# Patient Record
Sex: Female | Born: 1940 | Race: White | Hispanic: No | Marital: Married | Smoking: Never smoker
Health system: Southern US, Community
[De-identification: ages and names within clinical notes are randomized; demographics above are authoritative.]

## PROBLEM LIST (undated history)

## (undated) DIAGNOSIS — D126 Benign neoplasm of colon, unspecified: Secondary | ICD-10-CM

## (undated) DIAGNOSIS — R1319 Other dysphagia: Secondary | ICD-10-CM

## (undated) DIAGNOSIS — R519 Headache, unspecified: Secondary | ICD-10-CM

## (undated) DIAGNOSIS — K5790 Diverticulosis of intestine, part unspecified, without perforation or abscess without bleeding: Secondary | ICD-10-CM

## (undated) DIAGNOSIS — I4819 Other persistent atrial fibrillation: Secondary | ICD-10-CM

## (undated) DIAGNOSIS — Z87442 Personal history of urinary calculi: Secondary | ICD-10-CM

## (undated) DIAGNOSIS — F329 Major depressive disorder, single episode, unspecified: Secondary | ICD-10-CM

## (undated) DIAGNOSIS — Z9889 Other specified postprocedural states: Secondary | ICD-10-CM

## (undated) DIAGNOSIS — I82409 Acute embolism and thrombosis of unspecified deep veins of unspecified lower extremity: Secondary | ICD-10-CM

## (undated) DIAGNOSIS — F32A Depression, unspecified: Secondary | ICD-10-CM

## (undated) DIAGNOSIS — Z8679 Personal history of other diseases of the circulatory system: Secondary | ICD-10-CM

## (undated) DIAGNOSIS — R0600 Dyspnea, unspecified: Secondary | ICD-10-CM

## (undated) DIAGNOSIS — M3313 Other dermatomyositis without myopathy: Secondary | ICD-10-CM

## (undated) DIAGNOSIS — J45909 Unspecified asthma, uncomplicated: Secondary | ICD-10-CM

## (undated) DIAGNOSIS — I340 Nonrheumatic mitral (valve) insufficiency: Secondary | ICD-10-CM

## (undated) DIAGNOSIS — F419 Anxiety disorder, unspecified: Secondary | ICD-10-CM

## (undated) DIAGNOSIS — K219 Gastro-esophageal reflux disease without esophagitis: Secondary | ICD-10-CM

## (undated) DIAGNOSIS — I071 Rheumatic tricuspid insufficiency: Secondary | ICD-10-CM

## (undated) DIAGNOSIS — I499 Cardiac arrhythmia, unspecified: Secondary | ICD-10-CM

## (undated) DIAGNOSIS — J449 Chronic obstructive pulmonary disease, unspecified: Secondary | ICD-10-CM

## (undated) DIAGNOSIS — K449 Diaphragmatic hernia without obstruction or gangrene: Secondary | ICD-10-CM

## (undated) DIAGNOSIS — M3390 Dermatopolymyositis, unspecified, organ involvement unspecified: Secondary | ICD-10-CM

## (undated) DIAGNOSIS — R1310 Dysphagia, unspecified: Secondary | ICD-10-CM

## (undated) DIAGNOSIS — M1990 Unspecified osteoarthritis, unspecified site: Secondary | ICD-10-CM

## (undated) DIAGNOSIS — C189 Malignant neoplasm of colon, unspecified: Secondary | ICD-10-CM

## (undated) DIAGNOSIS — E7800 Pure hypercholesterolemia, unspecified: Secondary | ICD-10-CM

## (undated) DIAGNOSIS — I5032 Chronic diastolic (congestive) heart failure: Secondary | ICD-10-CM

## (undated) DIAGNOSIS — R911 Solitary pulmonary nodule: Secondary | ICD-10-CM

## (undated) DIAGNOSIS — R51 Headache: Secondary | ICD-10-CM

## (undated) DIAGNOSIS — R011 Cardiac murmur, unspecified: Secondary | ICD-10-CM

## (undated) DIAGNOSIS — K222 Esophageal obstruction: Secondary | ICD-10-CM

## (undated) DIAGNOSIS — R112 Nausea with vomiting, unspecified: Secondary | ICD-10-CM

## (undated) HISTORY — DX: Pure hypercholesterolemia, unspecified: E78.00

## (undated) HISTORY — PX: OTHER SURGICAL HISTORY: SHX169

## (undated) HISTORY — DX: Gastro-esophageal reflux disease without esophagitis: K21.9

## (undated) HISTORY — PX: TUBAL LIGATION: SHX77

## (undated) HISTORY — DX: Dermatopolymyositis, unspecified, organ involvement unspecified: M33.90

## (undated) HISTORY — DX: Diaphragmatic hernia without obstruction or gangrene: K44.9

## (undated) HISTORY — DX: Rheumatic tricuspid insufficiency: I07.1

## (undated) HISTORY — PX: BREAST SURGERY: SHX581

## (undated) HISTORY — DX: Diverticulosis of intestine, part unspecified, without perforation or abscess without bleeding: K57.90

## (undated) HISTORY — DX: Other dermatomyositis without myopathy: M33.13

## (undated) HISTORY — PX: LEG SKIN LESION  BIOPSY / EXCISION: SUR473

## (undated) HISTORY — PX: TOOTH EXTRACTION: SUR596

## (undated) HISTORY — DX: Esophageal obstruction: K22.2

## (undated) HISTORY — DX: Malignant neoplasm of colon, unspecified: C18.9

## (undated) HISTORY — DX: Other dysphagia: R13.19

## (undated) HISTORY — DX: Dysphagia, unspecified: R13.10

## (undated) HISTORY — DX: Other persistent atrial fibrillation: I48.19

## (undated) HISTORY — DX: Benign neoplasm of colon, unspecified: D12.6

## (undated) HISTORY — DX: Solitary pulmonary nodule: R91.1

## (undated) HISTORY — PX: APPENDECTOMY: SHX54

## (undated) HISTORY — DX: Nonrheumatic mitral (valve) insufficiency: I34.0

## (undated) HISTORY — PX: VEIN LIGATION AND STRIPPING: SHX2653

---

## 1997-04-23 DIAGNOSIS — C189 Malignant neoplasm of colon, unspecified: Secondary | ICD-10-CM

## 1997-04-23 HISTORY — DX: Malignant neoplasm of colon, unspecified: C18.9

## 1997-04-23 HISTORY — PX: LOW ANTERIOR BOWEL RESECTION: SUR1240

## 2003-04-28 ENCOUNTER — Ambulatory Visit (HOSPITAL_COMMUNITY): Admission: RE | Admit: 2003-04-28 | Discharge: 2003-04-28 | Payer: Self-pay | Admitting: Family Medicine

## 2003-06-09 ENCOUNTER — Ambulatory Visit (HOSPITAL_COMMUNITY): Admission: RE | Admit: 2003-06-09 | Discharge: 2003-06-09 | Payer: Self-pay | Admitting: Family Medicine

## 2003-07-19 ENCOUNTER — Ambulatory Visit (HOSPITAL_COMMUNITY): Admission: RE | Admit: 2003-07-19 | Discharge: 2003-07-19 | Payer: Self-pay | Admitting: Gastroenterology

## 2003-07-26 ENCOUNTER — Ambulatory Visit (HOSPITAL_COMMUNITY): Admission: RE | Admit: 2003-07-26 | Discharge: 2003-07-26 | Payer: Self-pay | Admitting: Gastroenterology

## 2003-08-12 ENCOUNTER — Ambulatory Visit (HOSPITAL_COMMUNITY): Admission: RE | Admit: 2003-08-12 | Discharge: 2003-08-12 | Payer: Self-pay | Admitting: Gastroenterology

## 2004-06-08 ENCOUNTER — Ambulatory Visit (HOSPITAL_COMMUNITY): Admission: RE | Admit: 2004-06-08 | Discharge: 2004-06-08 | Payer: Self-pay | Admitting: Family Medicine

## 2004-09-26 ENCOUNTER — Ambulatory Visit (HOSPITAL_COMMUNITY): Admission: RE | Admit: 2004-09-26 | Discharge: 2004-09-26 | Payer: Self-pay | Admitting: Family Medicine

## 2004-11-16 ENCOUNTER — Ambulatory Visit: Payer: Self-pay | Admitting: Internal Medicine

## 2004-11-20 ENCOUNTER — Ambulatory Visit: Payer: Self-pay | Admitting: Internal Medicine

## 2004-11-20 ENCOUNTER — Ambulatory Visit (HOSPITAL_COMMUNITY): Admission: RE | Admit: 2004-11-20 | Discharge: 2004-11-20 | Payer: Self-pay | Admitting: Internal Medicine

## 2004-11-20 ENCOUNTER — Encounter: Payer: Self-pay | Admitting: Internal Medicine

## 2005-07-16 ENCOUNTER — Ambulatory Visit (HOSPITAL_COMMUNITY): Admission: RE | Admit: 2005-07-16 | Discharge: 2005-07-16 | Payer: Self-pay | Admitting: Family Medicine

## 2005-12-18 ENCOUNTER — Ambulatory Visit: Payer: Self-pay | Admitting: Internal Medicine

## 2006-01-01 ENCOUNTER — Ambulatory Visit (HOSPITAL_COMMUNITY): Admission: RE | Admit: 2006-01-01 | Discharge: 2006-01-01 | Payer: Self-pay | Admitting: Internal Medicine

## 2006-01-01 ENCOUNTER — Ambulatory Visit: Payer: Self-pay | Admitting: Internal Medicine

## 2006-04-09 ENCOUNTER — Ambulatory Visit: Payer: Self-pay | Admitting: Internal Medicine

## 2006-07-18 ENCOUNTER — Ambulatory Visit (HOSPITAL_COMMUNITY): Admission: RE | Admit: 2006-07-18 | Discharge: 2006-07-18 | Payer: Self-pay | Admitting: Family Medicine

## 2006-07-23 ENCOUNTER — Ambulatory Visit: Payer: Self-pay | Admitting: Internal Medicine

## 2006-07-23 ENCOUNTER — Ambulatory Visit (HOSPITAL_COMMUNITY): Admission: RE | Admit: 2006-07-23 | Discharge: 2006-07-23 | Payer: Self-pay | Admitting: Family Medicine

## 2006-08-28 ENCOUNTER — Ambulatory Visit: Payer: Self-pay | Admitting: Internal Medicine

## 2007-07-09 ENCOUNTER — Ambulatory Visit (HOSPITAL_COMMUNITY): Admission: RE | Admit: 2007-07-09 | Discharge: 2007-07-09 | Payer: Self-pay | Admitting: Family Medicine

## 2007-07-11 ENCOUNTER — Emergency Department (HOSPITAL_COMMUNITY): Admission: RE | Admit: 2007-07-11 | Discharge: 2007-07-11 | Payer: Self-pay | Admitting: Family Medicine

## 2007-07-15 ENCOUNTER — Ambulatory Visit (HOSPITAL_COMMUNITY): Admission: RE | Admit: 2007-07-15 | Discharge: 2007-07-15 | Payer: Self-pay | Admitting: Family Medicine

## 2007-07-23 HISTORY — PX: ESOPHAGOGASTRODUODENOSCOPY: SHX1529

## 2007-07-24 ENCOUNTER — Ambulatory Visit: Payer: Self-pay | Admitting: Internal Medicine

## 2007-07-24 ENCOUNTER — Ambulatory Visit (HOSPITAL_COMMUNITY): Admission: RE | Admit: 2007-07-24 | Discharge: 2007-07-24 | Payer: Self-pay | Admitting: Family Medicine

## 2007-08-12 ENCOUNTER — Ambulatory Visit (HOSPITAL_COMMUNITY): Admission: RE | Admit: 2007-08-12 | Discharge: 2007-08-12 | Payer: Self-pay | Admitting: Internal Medicine

## 2007-08-12 ENCOUNTER — Ambulatory Visit: Payer: Self-pay | Admitting: Internal Medicine

## 2007-09-17 ENCOUNTER — Ambulatory Visit: Payer: Self-pay | Admitting: Internal Medicine

## 2008-01-20 ENCOUNTER — Ambulatory Visit: Payer: Self-pay | Admitting: Gastroenterology

## 2008-02-10 ENCOUNTER — Ambulatory Visit: Payer: Self-pay | Admitting: Internal Medicine

## 2008-02-22 HISTORY — PX: COLONOSCOPY: SHX174

## 2008-03-01 ENCOUNTER — Ambulatory Visit (HOSPITAL_COMMUNITY): Admission: RE | Admit: 2008-03-01 | Discharge: 2008-03-01 | Payer: Self-pay | Admitting: Internal Medicine

## 2008-03-01 ENCOUNTER — Ambulatory Visit: Payer: Self-pay | Admitting: Internal Medicine

## 2008-04-07 ENCOUNTER — Ambulatory Visit: Payer: Self-pay | Admitting: Internal Medicine

## 2008-07-16 ENCOUNTER — Ambulatory Visit (HOSPITAL_COMMUNITY): Admission: RE | Admit: 2008-07-16 | Discharge: 2008-07-16 | Payer: Self-pay | Admitting: Family Medicine

## 2008-07-30 ENCOUNTER — Ambulatory Visit (HOSPITAL_COMMUNITY): Admission: RE | Admit: 2008-07-30 | Discharge: 2008-07-30 | Payer: Self-pay | Admitting: Family Medicine

## 2009-08-02 ENCOUNTER — Ambulatory Visit (HOSPITAL_COMMUNITY): Admission: RE | Admit: 2009-08-02 | Discharge: 2009-08-02 | Payer: Self-pay | Admitting: Family Medicine

## 2010-04-03 ENCOUNTER — Encounter (INDEPENDENT_AMBULATORY_CARE_PROVIDER_SITE_OTHER): Payer: Self-pay | Admitting: *Deleted

## 2010-04-26 DIAGNOSIS — E7800 Pure hypercholesterolemia, unspecified: Secondary | ICD-10-CM | POA: Insufficient documentation

## 2010-04-26 DIAGNOSIS — K219 Gastro-esophageal reflux disease without esophagitis: Secondary | ICD-10-CM | POA: Insufficient documentation

## 2010-04-26 DIAGNOSIS — K644 Residual hemorrhoidal skin tags: Secondary | ICD-10-CM | POA: Insufficient documentation

## 2010-04-26 DIAGNOSIS — E119 Type 2 diabetes mellitus without complications: Secondary | ICD-10-CM | POA: Insufficient documentation

## 2010-04-26 DIAGNOSIS — K5730 Diverticulosis of large intestine without perforation or abscess without bleeding: Secondary | ICD-10-CM | POA: Insufficient documentation

## 2010-04-26 DIAGNOSIS — R1313 Dysphagia, pharyngeal phase: Secondary | ICD-10-CM | POA: Insufficient documentation

## 2010-04-26 DIAGNOSIS — D010 Carcinoma in situ of colon: Secondary | ICD-10-CM | POA: Insufficient documentation

## 2010-05-01 ENCOUNTER — Ambulatory Visit
Admission: RE | Admit: 2010-05-01 | Discharge: 2010-05-01 | Payer: Self-pay | Source: Home / Self Care | Attending: Urgent Care | Admitting: Urgent Care

## 2010-05-01 DIAGNOSIS — K5900 Constipation, unspecified: Secondary | ICD-10-CM | POA: Insufficient documentation

## 2010-05-25 NOTE — Assessment & Plan Note (Signed)
Summary: 1 YR FU/HEMORRHOIDS/GERD/SS   Primary Care Provider:  Dr Phillips Odor  Chief Complaint:  2 year follow up.  History of Present Illness: 70 y/o caucasian female here for FU GERD & hemorrhoids, doing well w/ rare flares heartburn only if she overeats.   Taking omeprazole 20mg  daily.  c/o constipation usually once a month, taking metamucil.  Seems to help.  BM daily w/ hard stools.  c/o small amt bright red blood on toilet paper & on stool and felt hemorrhoids 02/2010.       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 Once Daily As Needed 2)  Fosamax 70 Mg Tabs (Alendronate Sodium) .... Wkly 3)  Daily-Vitamin  Tabs (Multiple Vitamin) .... Once Daily 4)  Calcium 500 Mg Tabs (Calcium) .... Once Daily 5)  Albuterol Sulfate (5 Mg/ml) 0.5% Nebu (Albuterol Sulfate) .... As Needed 6)  Vytorin 10-20 Mg Tabs (Ezetimibe-Simvastatin) .... Once Daily 7)  Fiber Diet  Tabs (Fiber) .... Once Daily 8)  Omeprazole 20 Mg Cpdr (Omeprazole) .... Once Daily 9)  Proctofoam Hc 1-1 % Foam (Hydrocortisone Ace-Pramoxine) .... Apply To Rectum Two Times A Day X 10 Days  Allergies (verified): 1)  ! Hydrocodone-Acetaminophen (Hydrocodone-Acetaminophen) 2)  ! * Ivp Dye 3)  ! Nabumetone (Nabumetone)  Past History:  Past Medical History: Hx colon ca hematuria hemorrhoids last colonoscopy Dr Jena Gauss 02/2008->ext hemorrhoids, dilated 2009 GERD DM hypercholesterolemia diverticulosis esophageal dysphagia, cervical esophageal web  Past Surgical History: INGUNAL HERNIA REPAIR s/p low ant resection, no adj chemo tubal ligation  Review  of Systems      See HPI General:  Denies fever, chills, sweats, anorexia, fatigue, weakness, malaise, weight loss, and sleep disorder. CV:  Denies chest pains, angina, palpitations, syncope, dyspnea on exertion, orthopnea, PND, peripheral edema, and claudication. Resp:  Denies dyspnea at rest, dyspnea with exercise, cough, sputum, wheezing, coughing up blood, and pleurisy. GI:  Denies difficulty swallowing, pain on swallowing, jaundice, diarrhea, black BMs, and fecal incontinence. GU:  Denies urinary burning, blood in urine, nocturnal urination, urinary frequency, and urinary incontinence. MS:  Denies joint pain / LOM, joint swelling, joint stiffness, joint deformity, low back pain, muscle weakness, muscle cramps, muscle atrophy, leg pain at night, leg pain with exertion, and shoulder pain / LOM hand / wrist pain (CTS). Derm:  Denies rash, itching, dry skin, hives, moles, warts, and unhealing ulcers. Psych:  Denies depression, anxiety, memory loss, suicidal ideation, hallucinations, paranoia, phobia, and confusion. Heme:  Denies bruising, bleeding, and enlarged lymph nodes.  Vital Signs:  Patient profile:   70 year old female Height:      62 inches Weight:      153 pounds BMI:     28.09 Temp:     97.6 degrees F oral Pulse rate:   72 / minute BP sitting:   128 / 88  (left arm) Cuff size:   regular  Vitals Entered By: Hendricks Limes LPN (May 01, 2010 10:56 AM)  Physical Exam  General:  Well developed, well nourished, no acute distress. Head:  Normocephalic and atraumatic. Eyes:  Sclera clear, no icterus. Mouth:  No deformity or lesions, dentition normal.  Neck:  Supple; no masses or thyromegaly. Lungs:  Clear throughout to auscultation. Heart:  Regular rate and rhythm; no murmurs, rubs,  or bruits. Abdomen:  Soft, nontender and nondistended. No masses, hepatosplenomegaly or hernias noted. Normal bowel sounds.without guarding and without rebound.   Msk:  Symmetrical with no gross  deformities. Normal posture. Extremities:  No clubbing, cyanosis, edema or deformities noted. Neurologic:  Alert and  oriented x4;  grossly normal neurologically. Skin:  Intact without significant lesions or rashes. Cervical Nodes:  No significant cervical adenopathy. Psych:  Alert and cooperative. Normal mood and affect.  Impression & Recommendations:  Problem # 1:  EXTERNAL HEMORRHOIDS (ICD-455.3)  70 y/o caucasian female w/ bleeding hemorrhoids in setting of chronic constipation.  Hx of colon ca with last colonoscopy 2009.  If bleeding persists, would consider repeating colonoscopy at earlier than recommended interval (2014).  Orders: Est. Patient Level III ([address])  Problem # 2:  Hx of CARCINOMA IN SITU OF COLON (ICD-230.3) see #1  Problem # 3:  GERD (ICD-530.81) Stable, continue omeprazole 20g daily  Problem # 4:  CONSTIPATION (ICD-564.00) Cont metamucil Add as needed Miralax 17 grams daily  Patient Instructions: 1)  Call if bleeding persists or further problems w/ hemorrhoids Prescriptions: PROCTOFOAM HC 1-1 % FOAM (HYDROCORTISONE ACE-PRAMOXINE) Apply to rectum two times a day x 10 days  #1 x 0   Entered and Authorized by:   Joselyn Arrow FNP-BC   Signed by:   Joselyn Arrow FNP-BC on 05/01/2010   Method used:   Electronically to        [facility]  [address] [address] [id number] (retail)       [address]       [place], [place]  [address]       Ph: [phone] or [phone]       Fax: [phone]   RxID:   [id number]

## 2010-05-25 NOTE — Letter (Signed)
Summary: Recall Office Visit  [facility]  [address]   [place], [place] [address]   Phone: [phone]  Fax: [phone]      April 03, 2010   [facility] [address] [place], [place]  [address] 1941-03-04   Dear Ms. Derousse,   According to our records, it is time for you to schedule a follow-up office visit with [name].   At your convenience, please call [phone] to schedule an office visit. If you have any questions, concerns, or feel that this letter is in error, we would appreciate your call.   Sincerely,    Diana Eves  [facility] Ph: [phone]   Fax: [phone]

## 2010-07-05 ENCOUNTER — Other Ambulatory Visit (HOSPITAL_COMMUNITY): Payer: Self-pay | Admitting: Family Medicine

## 2010-07-05 DIAGNOSIS — Z139 Encounter for screening, unspecified: Secondary | ICD-10-CM

## 2010-07-27 ENCOUNTER — Encounter: Payer: Self-pay | Admitting: Internal Medicine

## 2010-08-07 ENCOUNTER — Ambulatory Visit (HOSPITAL_COMMUNITY)
Admission: RE | Admit: 2010-08-07 | Discharge: 2010-08-07 | Disposition: A | Discharge status: Home / Self Care | Payer: Medicare Other | Source: Ambulatory Visit | Attending: Family Medicine | Admitting: Family Medicine

## 2010-08-07 DIAGNOSIS — Z1231 Encounter for screening mammogram for malignant neoplasm of breast: Secondary | ICD-10-CM | POA: Insufficient documentation

## 2010-08-07 DIAGNOSIS — Z139 Encounter for screening, unspecified: Secondary | ICD-10-CM

## 2010-09-05 NOTE — Assessment & Plan Note (Signed)
NAME:  [name] [name], [name] [name]             CHART#:  [id number]   DATE:  09/17/2007                       DOB:  10-12-1940   CHIEF COMPLAINT:  Followup EGD.   PROBLEM LIST:  1. Esophageal dysphagia, status post dilatation of Schatzki's ring,      large hiatal hernia on EGD August 12, 2007.  Cervical esophageal web      dilated as well.  2. Chronic gastroesophageal reflux disease.  3. Colon cancer, status post low anterior resection back in 1999.  4. History of colonic adenoma.  5. Negative colonoscopy in 2007.  6. Diabetes mellitus.  7. Hypercholesterolemia.  8. Inguinal hernia repair.  9. Vein stripping.  10.Appendicitis, post appendectomy.  11.Internal, external hemorrhoids.  12.Diverticulosis.   Mrs. [name] is [name] 70 year old female.  She was status post esophageal  dilatation for dysphasia.  She is doing quite well.  She is taking  omeprazole 20 mg daily.  She continues to note some mid abdominal pain.  She describes loud passing of flatus over the last 2-3 weeks.  She has  noticed [name] change in her bowel habits.  She has had different size and  shape.  She denies any rectal bleeding or melena or mucous in her  stools.  Denies any constipation and diarrhea.  Denies any heartburn,  indigestion, dysphagia, odynophagia, anorexia, or early satiety.  Her  weight has remained stable.  Denies any fever or chills.  She is  complaining of bilateral hip pain.  It is  worse with movement.  She has  not mentioned this to her primary care [name] [name].  She believes she may  have more arthritis..   CURRENT MEDICATIONS:  See the list from Sep 17, 2007.   ALLERGIES:  HYDROCODONE, IVP DYE.   OBJECTIVE:  VITAL SIGNS:  Weight 156 pounds, height 52-1/2 inches,  temperature 98.1, blood pressure 120/80, pulse 64.  GENERAL:  [name] [name] is [name] well-developed, well-nourished Caucasian  female.  No acute distress.  HEENT:  Sclerae nonicteric.  Clear conjunctivae. Oropharynx pink and  moist without  lesions.  CHEST:  Regular rate and rhythm.  Normal S1 and S2.  ABDOMEN:  Positive bowel sounds x4.  No bruits auscultated.  Soft,  nontender without palpable mass or megaly.  No rebound tenderness or  guarding.   ASSESSMENT:  1. An esophageal dysphagia secondary to cervical web and Schatzki's      ring, resolved post dilatation.  2. History of colonic adenomas and colon cancer, status post low      anterior resection.  3. Abdominal bloating and increased flatus.  4. Chronic gastroesophageal reflux disease.  5. Diverticulosis.   PLAN:  1. She was supposed to be taking omeprazole 40 mg daily after      esophageal dilatation; however, she is still on once daily.  Will      increase to omeprazole 20 mg b.i.d.  2. Gas bloat literature given for her review.  3. Colonoscopy in 2010.  4. Tylenol for her bilateral hip pain and follow up with Dr. Phillips Odor      regarding this.  5. Fiber supplement of choice.       [name] [name], N.P.  Electronically Signed     [name] [name], M.D.  Electronically Signed    [name] [name]/MEDQ  D:  09/17/2007  T:  09/18/2007  Job:  [id number]  cc:   Corrie Mckusick, M.D.

## 2010-09-05 NOTE — Op Note (Signed)
NAME:  [name], [name]            ACCOUNT NO.:  [id number]   MEDICAL RECORD NO.:  [id number]          PATIENT TYPE:  AMB   LOCATION:  DAY                           FACILITY:  [facility]   PHYSICIAN:  R. Roetta Sessions, M.D. DATE OF BIRTH:  [birth year]   DATE OF PROCEDURE:  08/12/2007  DATE OF DISCHARGE:                               OPERATIVE REPORT   PROCEDURE:  Esophagogastroduodenoscopy and Maloney dilation.   INDICATIONS FOR PROCEDURE:  A 70 year old lady with a history of  worsening indigestion symptoms and esophageal dysphagia.  She has a  known large hiatal hernia on prior CT.  EGD is now being done.  Potentials for esophageal dilation were reviewed.  Risks, benefits,  alternatives and limitations have been discussed.  Questions were  answered.   PROCEDURE NOTE:  O2 saturation, blood pressure, pulse, respirations were  monitored throughout the entire procedure.   CONSCIOUS SEDATION:  Versed 5 mg IV and Demerol 100 mg IV in divided  doses.  Cetacaine spray for cricopharyngeal anesthesia.   INSTRUMENT:  Pentax video chip system.   FINDINGS:  Examination of the tubular esophagus showed a questionable  cervical esophageal web.  There was also a noncritical Schatzki ring.  Intervening esophagus appeared entirely normal.  EG junction was easily  traversed.  Stomach:  The gastric cavity was empty, insufflated well with air.  Thorough examination of gastric mucosa including retroflexion of  proximal stomach, esophagogastric junction demonstrated only a large  hiatal hernia.  Pylorus was patent and easily traversed.  Examination of  the bulb and second portion revealed no abnormalities.   THERAPEUTIC/DIAGNOSTIC MANEUVERS PERFORMED:  The scope was withdrawn.  A  56-French Maloney dilator was passed to full insertion with ease.  Subsequently, a 58-French Maloney dilator was passed to full insertion  with ease.  A look back revealed a probable cervical esophageal web had  been ruptured  without apparent complication and also the ring had been  ruptured without apparent complication.  The patient tolerated the  procedure well and was reactive in endoscopy.   IMPRESSION:  1. 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  2. Schatzki ring, noncritical appearing, status post      dilation/disruption as described above, otherwise normal-appearing      esophagus, large hiatal hernia, otherwise normal stomach, D1 and      D2.   RECOMMENDATIONS:  1. Continue omeprazole 40 mg orally daily.  2. Emphasized swallowing precaution with all her medications including      Fosamax.  3. Followup appointment with [name] to assess her progress in 4 to 6      weeks.      Jonathon Bellows, M.D.  Electronically Signed     RMR/MEDQ  D:  08/12/2007  T:  08/13/2007  Job:  [id number]   cc:   Corrie Mckusick, M.D.  Fax: [phone]

## 2010-09-05 NOTE — H&P (Signed)
NAME:  [name], [name]            ACCOUNT NO.:  [id number]   MEDICAL RECORD NO.:  [id number]          PATIENT TYPE:  AMB   LOCATION:  DAY                           FACILITY:  [facility]   PHYSICIAN:  R. Roetta Sessions, M.D. DATE OF BIRTH:  [birth year]   DATE OF ADMISSION:  DATE OF DISCHARGE:  LH                              HISTORY & PHYSICAL   CHIEF COMPLAINT:  Indigestion not lower retrosternal area, hiatal  hernia, history of colon cancer.   [name] is a 70 year old Caucasian female with a history of  colon cancer status post low anterior section back in 1999.  She has had  adenoma since that time.  Had a negative colonoscopy in 2007.  Is due  for surveillance 2010.  She did not have any lower GI tract symptoms  such as melena, rectal lesion.  She saw Dr. Phillips Odor recently who sent  her home with three Hemoccult cards.  She has not yet returned them.  Her concern, however, is some lower retroxiphoid discomfort in a knot as  well as worsening indigestion she describes as heartburn.  She had one  incident  recently.  She swallowed some food and felt it lodged behind  her breastbone.  She has been on Fosamax for some time. She has been on  omeprazole 20 mg orally daily for several months.  Previously was on  Aciphex.  Has not had any early satiety.  No nausea or vomiting.  She  does not smoke or consume alcohol.  There is no history of upper GI  tract pathology/disease.  However, she has never had her upper GI tract  imaged.   PAST MEDICAL HISTORY:  Significant for colon cancer as outlined above.  Type 2 diabetes mellitus, hypercholesterolemia.   PAST SURGICAL HISTORY:  Inguinal hernia repair, vein streaking,  appendectomy.   CURRENT MEDICATIONS:  Xanax, Fosamax, calcium, Tylenol, albuterol,  Vytorin, fiber supplement, omeprazole 20 mg daily.   ALLERGIES:  IVP DYE, HYDROCODONE.   HISTORY OF PRESENT ILLNESS:  She had a CAT scan on March 20 which  demonstrated a large  hiatal hernia, bilateral renal cyst, small duodenal  diverticulum.   [name] tells me she is being sent to Dr. Lovell Sheehan for  consideration of hiatal hernia repair.   FAMILY HISTORY:  No chronic GI or liver illness.  Mother died of brain  tumor at age 58.  Father of MI at age 53.   SOCIAL HISTORY:  The patient is married.  She has two children.  She is  retired.  No alcohol, tobacco or drug use.   REVIEW OF SYSTEMS:  Weight stable.  No chest pain, no dyspnea on  exertion.   PHYSICAL EXAMINATION:  GENERAL:  Pleasant 70 year old lady resting  comfortably.  VITAL SIGNS:  Weight 156.5, height 5 feet 2.5 inches, temp 98.2, BP  110/74, pulse 72.  SKIN:  Warm and dry.  HEENT:  There is no scleral icterus.  Conjunctivae are pink.  CHEST:  Lungs are clear to auscultation.  CARDIAC:  Regular rate and rhythm without murmur, gallop or rub.  ABDOMEN:  Nondistended.  Positive  bowel sounds.  She does have some  tenderness below the xiphoid process appreciable, of marble size, fairly  minimal not most consistent with a tender lipoma.  She has multiple  other similar larger lesions on her upper extremities as well.   IMPRESSION:  [name] is a pleasant 70 year old lady with  recent indigestion symptoms which may be related to gastroesophageal  reflux disease and her episode of esophageal food impaction.  She has a  large hiatal hernia on recent CT scan.  She may have an associated ring  or stricture.  We need to consider possibility of complicated  gastroesophageal reflux disease.  My concern is high given the fact she  has been taking Fosamax.   She has a tender abdominal wall lipoma which is contributing to her  symptoms.   She states she is going to be seeing Dr. Lovell Sheehan for hiatal hernia  repair.  I told her she ought to hold off on that until we can  investigate her upper GI tract symptoms further.  As a separate issue  she has a history of colon cancer and is due for  surveillance in 2010.  Unless her Hemoccults are positive I see no need to change the  scheduling or surveillance  slated for next year.  Will plan EGD with  possible esophageal dilation or other maneuvers as appropriate in the  very near future.  Risks, benefits, alternatives, and limitations have  been reviewed with [name] [name] and her questions answered.  She is  agreeable.   I would like to thank Dr. Phillips Odor for allowing me to see this nice lady  today.  Further recommendations to follow.      Jonathon Bellows, M.D.  Electronically Signed     RMR/MEDQ  D:  07/24/2007  T:  07/24/2007  Job:  [id number]

## 2010-09-05 NOTE — H&P (Signed)
NAME:  Trotter, Yukari            ACCOUNT NO.:  [id number]   MEDICAL RECORD NO.:  [id number]          PATIENT TYPE:  AMB   LOCATION:  DAY                           FACILITY:  [facility]   PHYSICIAN:  R. Roetta Sessions, M.D. DATE OF BIRTH:  Feb 07, 1941   DATE OF ADMISSION:  DATE OF DISCHARGE:  LH                              HISTORY & PHYSICAL   CHIEF COMPLAINT:  Hematochezia, history of colon cancer.   Ms. Gracy Racer is a very pleasant 70 year old lady with a history of  colon cancer status post low anterior resection back in 1999.  She had a  colonoscopy in 2007 for intermittent hematochezia.  She was found to  have internal and external hemorrhoids.  Anastomosis of 10-12 cm.  It is  recommended that she come back in 2011 for a followup exam.  She has had  intermittent rectal bleeding off and on over the past year, perhaps 6  episodes.  She was hemoccult negative through the office on January 20, 2008.  She did take a course of intermittent Forte suppositories.  She is not having any abdominal pain.  Reflux symptom is well-controlled  on omeprazole 20 mg orally daily.   PAST MEDICAL HISTORY:  Significant for colorectal cancer status post low  anterior resection limited stage disease requiring no adjuvant therapy,  history of dysphagia related to cervical esophageal web status post  dilation in April 2009, chronic gastroesophageal reflux disease, history  of colonic polyps as well, type 2 diabetes mellitus,  hypercholesterolemia, diverticulosis.   PAST SURGERIES:  Low anterior resection status post appendectomy,  inguinal hernia repair.   CURRENT MEDICATIONS:  1. Xanax 0.5 mg daily p.r.n.  2. Fosamax 70 mg weekly.  3. One a day calcium and vitamin D supplement.  4. Tylenol p.r.n.  5. Albuterol inhaler p.r.n.  6. Vytorin 10/20 daily.  7. Metamucil daily.  8. Omeprazole 20 mg orally daily.   ALLERGIES:  HYDROCODONE, IVP DYE, NABUMETONE.   FAMILY HISTORY:  No chronic GI or liver  illness.  Father died with MI at  age 41, mother died of brain tumor at age 87.   SOCIAL HISTORY:  The patient is married.  She has 2 children.  She is  retired.  No alcohol, tobacco, or illicit drug use.   REVIEW OF SYSTEMS:  Weight is stable.  No chest pain, dyspnea, fever, or  chills.  No odynophagia, no dysphagia.   PHYSICAL EXAMINATION:  GENERAL:  Pleasant 70 year old lady resting  comfortably.  VITAL SIGNS:  Weight is up 3 pounds from prior weight on July 24, 2007.  She weighed 155.5 pounds on January 20, 2008, height 5 feet 2 inches,  temperature 98, BP 124/70, pulse 80.  SKIN:  Warm and dry.  CHEST:  Lungs are clear to auscultation.  CARDIAC:  Regular rate and rhythm without murmur, gallop, or rub.  BREAST:  Deferred.  ABDOMEN:  Nondistended.  Positive bowel sounds, soft, nontender without  appreciable mass, or organomegaly.   IMPRESSION:  Rectal exam deferred to the time of colonoscopy.   IMPRESSION:  Ms. Madilynne Mullan is a very pleasant 70 year old with  a  history of colon cancer.  Last colonoscopy was 2 years ago.  She has  known internal and external hemorrhoids, intermittent hematochezia, and  most likely is related to hemorrhoids; however, this is a bothersome  ongoing complaint in view of her history of colon cancer.  It has been  just over 2 years since she last had her lower GI tract imaged.   RECOMMENDATIONS:  I feel she will likely ultimately need to go ahead and  see a surgeon for more definitive treatment of her hemorrhoids as they  have been a nuisance for this nice lady really for a number of years.  However, given her history of colon cancer, it has been over 2 years  since she last had her colon imaged, I told Ms. Haflinger it would be  best if she had her colon looked at again currently before sending her  to a surgeon and have her hemorrhoids dealt with surgically.  The risks,  benefits, alternatives, and limitations have been reviewed.   Questions  were answered.  She is agreeable.  We will make further recommendations  once colonoscopy has been performed.      Jonathon Bellows, M.D.  Electronically Signed     RMR/MEDQ  D:  02/10/2008  T:  02/11/2008  Job:  [id number]   cc:   Corrie Mckusick, M.D.  Fax: [phone]

## 2010-09-05 NOTE — Assessment & Plan Note (Signed)
NAME:  [name] [name] [name], [name] [name] [name]             CHART#:  [id number]   DATE:  04/07/2008                       DOB:  16-Jan-1941   CHIEF COMPLAINT:  Followup hemorrhoids.   PROBLEM LIST:  1. External hemorrhoid tags with hematochezia, last colonoscopy      March 01, 2008, by Dr. Jena Gauss.  2. History of colon cancer, status post low anterior resection,      limited stage disease requiring no adjuvant therapy.  3. History of esophageal dysphagia secondary to cervical esophageal      web, status post dilatation April 2009.  4. Chronic gastroesophageal reflux disease.  5. Type 2 diabetes mellitus.  6. Hypercholesterolemia.  7. Diverticulosis.  8. Inguinal hernia repair.   SUBJECTIVE:  The patient is [name] [name] 70 year old Caucasian female.  She has  done quite well since colonoscopy.  She did complete [name] [name] course of Anusol  Suppository.  She denies any proctalgia or rectal pruritus.  She denies  any abdominal pain, nausea, or vomiting.  Denies any constipation or  diarrhea.  Her weight is up 9 pounds in last 3 months, she is quite  concerned about this.  She denies any recent NSAID use.  She did have  some upper abdominal pain on Thanksgiving Day, but this did resolve and  was self-limited.  She denies any breakthrough heartburn or indigestion  so long as she takes her omeprazole 20 mg daily.   CURRENT MEDICATIONS:  See the list from April 07, 2008.   ALLERGIES:  Hydrocodone, IVP dye, and nabumetone.   OBJECTIVE:  VITAL SIGNS:  Weight 184 pounds, height 62-1/2 inches,  temperature 98, blood pressure 138/82, and pulse 72.  GENERAL:  She is an overweight Caucasian female, who is alert, oriented,  pleasant, cooperative, no acute distress.  HEENT:  Sclerae clear and nonicteric.  Conjunctivae pink.  Oropharynx  pink and moist without any lesions.  CHEST:  Heart, regular rate and rhythm.  Normal S1 and S2.  ABDOMEN:  Positive bowel sounds x4.  No bruits auscultated.  Soft,  nontender, nondistended without  palpable mass or hepatosplenomegaly.  No  rebound, tenderness, or guarding.  EXTREMITIES:  Without clubbing or edema.   ASSESSMENT:  1. Hemorrhoids, resolved.  If they cause further bleeding in the      future, she will need surgical evaluation for resection.  2. History of colon carcinoma and diverticulosis and chronic      gastroesophageal reflux disease, well controlled on proton pump      inhibitor.   PLAN:  1. If she has any further hemorrhoidal bleeding, she is going to call      [name] and we will move her to surgery.  2. Continue Daily fiber supplement Choice.  3. Continue omeprazole 20 mg daily.  4. If she has any further upper abdominal pain, she will call us.  5. Stressed low-fat, low-cholesterol diet and urged gradual weight      loss from just 2 pounds per week.       Lorenza Burton, N.P.  Electronically Signed     R. Roetta Sessions, M.D.  Electronically Signed    KJ/MEDQ  D:  04/07/2008  T:  04/08/2008  Job:  [id number]   cc:   Corrie Mckusick, M.D.

## 2010-09-05 NOTE — Op Note (Signed)
NAME:  [name], [name]            ACCOUNT NO.:  [id number]   MEDICAL RECORD NO.:  [id number]          PATIENT TYPE:  AMB   LOCATION:  DAY                           FACILITY:  [facility]   PHYSICIAN:  R. Roetta Sessions, M.D. DATE OF BIRTH:  Apr 19, 1941   DATE OF PROCEDURE:  03/01/2008  DATE OF DISCHARGE:                               OPERATIVE REPORT   DIAGNOSTIC COLONOSCOPY   INDICATIONS FOR PROCEDURE:  A 70 year old lady with a history of colon  cancer status post low anterior resection.  Last colonoscopy 2 years  ago.  She has known hemorrhoidal disease and has had persisting  hematochezia.  It has been an ongoing nuisance for her for some years.  Because of her history of colon cancer and her ongoing symptoms, a  colonoscopy is now being done.  Risks, benefits, and alternatives have  been reviewed.  Questions answered.  Please see documentation in medical  record.   PROCEDURE NOTE:  O2 saturation, blood pressure, pulse, respirations were  monitored throughout the entire procedure.   CONSCIOUS SEDATION:  Versed 4 mg IV and Demerol 75 mg IV in divided  doses.   INSTRUMENTATION:  Pentax video chip system.   FINDINGS:  Digital rectal exam revealed no abnormalities.  Endoscopic  findings:  Prep was good.  Colon:  Colonic mucosa was surveyed from the  rectosigmoid junction through the left transverse right colon to the  appendiceal orifice, ileocecal valve, and cecum.  These structures were  well seen and photographed for the record.  From this level, the scope  was slowly withdrawn.  All previously mentioned mucosal surfaces were  again seen.  On examination, the patient had pancolonic diverticula.  The surgical anastomosis was identified at 12 cm from the rectum.  It  appeared normal.  The scope was pulled down into the rectum where  thorough examination of rectal mucosa including retroflexion and  anteversion demonstrated no abnormalities in the anal canal and external  exam  demonstrated 2 external hemorrhoidal tags.  The patient tolerated  procedure well and was reactive in Endoscopy.   IMPRESSION:  1. External hemorrhoidal tags, otherwise normal rectal mucosa status      post surgical resection with a normal-appearing anastomosis 12 cm.  2. Pan colonic diverticulum and a residual colonic mucosa appeared      normal.   RECOMMENDATIONS:  1. Hemorrhoid literature provided to Ms. Arrey.  Daily Metamucil      or Citrucel fiber supplement.  2. A 10-day course of Anusol HC suppositories 1 per rectum at bedtime.      If rectal bleeding does not resolve, I would go ahead and send her      to a surgeon for consideration of definitive treatment of      hemorrhoidal disease.  She should have a repeat colonoscopy in 5      years.      Jonathon Bellows, M.D.  Electronically Signed     RMR/MEDQ  D:  03/01/2008  T:  03/02/2008  Job:  [id number]   cc:   Corrie Mckusick, M.D.  Fax: [phone]

## 2010-09-05 NOTE — Assessment & Plan Note (Signed)
NAME:  [name] [name], [name] [name]             CHART#:  [id number]   DATE:  01/20/2008                       DOB:  06-04-1940   PRIMARY CARE PHYSICIAN:  Corrie Mckusick, MD.   CHIEF COMPLAINT:  Proctalgia and hematochezia.   PROBLEM LIST:  1. History of colon cancer, status post low anterior resection in      1999.  2. Esophageal dysphagia, status post dilatation of Schatzki's ring,      large hiatal hernia, and EGD on August 12, 2007.  Cervical      esophageal web dilated as well.  3. History of colonic adenoma.  4. Last colonoscopy was negative in 2007 by Dr. Jena Gauss.  5. Diabetes mellitus.  6. Hypercholesterolemia.  7. Inguinal hernia repair.  8. Vein stripping.  9. Appendicitis, status post appendectomy.  10.Internal and external hemorrhoids.  11.Diverticulosis.   SUBJECTIVE:  The patient is a 70 year old female who presents today for  further evaluation of hematochezia.  She has had one episode of moderate  volume bright red blood, which she noticed on top of her stool in the  toilet.  There was no evidence of clots.  She has been having some  proctalgia after having a bowel movement.  She denies any abdominal  pain.  She describes the sensation is rectal pressure.  She denies any  nausea or vomiting, heartburn, indigestion, anorexia, or early satiety.  Her weight has remained stable.  She is having anywhere from 4 to 6 soft  semi-formed bowel movements per day.  She has not seen any further  bleeding since 2 weeks ago when she had this episode.   CURRENT MEDICATIONS:  See the list from 01/20/2008.   ALLERGIES:  Hydrocodone, IVP dye, and nabumetone.   OBJECTIVE:  VITAL SIGNS:  Weight 155.5 pounds , height 62-1/2 inches,  temperature 98, blood pressure 130/70, and pulse 72.  GENERAL:  She is a well-developed and well-nourished Caucasian female,  in no acute distress.  HEENT:  Sclerae clear and nonicteric.  Conjunctivae pink.  Oropharynx  pink and moist without any lesions.  CHEST:   Heart regular rate and rhythm.  Normal S1 and S2.  ABDOMEN:  Positive bowel sounds x4.  No bruits auscultated.  Soft,  nontender, and nondistended without palpable mass or hepatosplenomegaly.  No rebound, tenderness, or guarding.  RECTAL:  She has large external and internal hemorrhoids.  She also  appears to have a mild rectal prolapse, which is easily reducible.  She  had a small amount of light brown stool which was obtained from the  vault, was Hemoccult negative.   PLAN:  1. We will aggressively treat her hemorrhoids and would like her to      come and have a repeat rectal exam in about 3 weeks to further      assess whether she is going to need endoscopic evaluation via flex      sig versus surgical referral, if she does not respond to standard      medical therapy.  2. AnaMantle HC Forte 1 capsule per rectum b.i.d., #20 with no      refills.  3. Office visit with Dr. Jena Gauss in 3 weeks.  She is going to call      sooner if she has any problems.       Lorenza Burton, N.P.  Electronically  Signed     R. Roetta Sessions, M.D.  Electronically Signed    KJ/MEDQ  D:  01/20/2008  T:  01/20/2008  Job:  [id number]   cc:   Corrie Mckusick, M.D.

## 2010-09-08 NOTE — Op Note (Signed)
NAME:  [name], [name]            ACCOUNT NO.:  [id number]   MEDICAL RECORD NO.:  [id number]          PATIENT TYPE:  AMB   LOCATION:  DAY                           FACILITY:  [facility]   PHYSICIAN:  R. Roetta Sessions, M.D. DATE OF BIRTH:  [birth year]   DATE OF PROCEDURE:  11/20/2004  DATE OF DISCHARGE:                                 OPERATIVE REPORT   PROCEDURE:  Colonoscopy and biopsy.   INDICATIONS FOR PROCEDURE:  Patient is a 70 year old lady with intermittent  blood per rectum.  She has a history of undergoing a colonic resection for  colon cancer back in 1999.  Colonoscopy is now being done.  It was discussed  with the patient at length potential risks and benefits had been reviewed.  She is agreeable.  Please see documented medical record.   DESCRIPTION OF PROCEDURE:  Saturation, blood pressure and pulses were  monitored.  At the time of discharge conscious sedation with Versed 4 mg IV  and Demerol 100 mg IV in divided doses.   INSTRUMENTS:  Olympus video chip system.   FINDINGS:  Digital rectal exam revealed no abnormalities.  Endoscopic  findings:  Prep was good.  Rectum:  Examination of the rectal mucosa on the  retroflexed view of the anal verge __________ demonstrating internal and  external hemorrhoids.  There was a 5 mm diminutive polyp at the surgical  anastomosis which is identified at 10 cm from the anal verge.   COLON:  Colonic mucosa was surveyed from the anastomosis all the way to the  cecum.  Ileocecal valve and appendiceal orifice structures well seen and  photographed for the record.  The patient had diverticula from the  anastomosis all the way to the cecum.  From this level, the scope was slowly  withdrawn and previously mentioned mucosal surfaces were again seen.  The  residual colonic mucosa appeared normal except for pancolonic diverticula  and diminutive rectal polyp at the anastomosis. The diminutive polyp at the  anastomosis was cold biopsied/removed.   Patient tolerated the procedure well  and __________.   IMPRESSION:  Internal and external hemorrhoids.  Diminutive polyp at the  surgical anastomosis 10 cm removed with cold biopsy forceps.  Pan colonic  diverticula.  The remainder of residual colon otherwise appeared normal.  I  suspect the patient bled from hemorrhoids recently.   RECOMMENDATIONS:  1.  Follow-up on pathology.  2.  Diverticulosis and hemorrhoid ledger provided to [name].  3.  A 10-day course of Anusol HC suppositories one per rectum at bedtime.      Follow-up on pathology.  4.  Further recommendations to follow.       RMR/MEDQ  D:  11/20/2004  T:  11/20/2004  Job:  [id number]   cc:   Corrie Mckusick, M.D.  Fax: [phone]

## 2010-09-08 NOTE — H&P (Signed)
NAME:  [name] [name], [name] [name]            ACCOUNT NO.:  [id number]   MEDICAL RECORD NO.:  [id number]          PATIENT TYPE:  AMB   LOCATION:  DAY                           FACILITY:  [facility]   PHYSICIAN:  R. Roetta Sessions, M.D. DATE OF BIRTH:  30-May-1940   DATE OF ADMISSION:  DATE OF DISCHARGE:  LH                              HISTORY & PHYSICAL   INDICATION:  Follow-up of colon cancer, status post low anterior  resection, 1999, history of adenoma and anastomosis in 2006, polyp and  colonoscopy in 2007 for low-volume hematochezia, demonstrated internal  and external hemorrhoids.   HISTORY:  She has done very well.  She was seen for clear reflux on  07/23/2006.  She took one course of AcipHex and it has pretty much  abolished her reflux symptoms.  She is now off of it.  Depending on body  frame, she is a good 20 to 25 pounds over her ideal body weight.  She is  not having any odynophagia or dysphagia.  She has not had an upper GI  tract evaluation, but symptoms come without the long features and they  were pretty much squelched with acid suppression therapy.  She is not  having any odynophagia or dysphagia stated.  She does take Fosamax.   CURRENT MEDICATIONS:  See MD list.   ALLERGIES:  HYDROCODONE.   PHYSICAL EXAMINATION:  GENERAL:  On exam today, it looks well.  VITAL SIGNS:  Weight 162, which is actually down 3 pounds.  Height 5  feet 2-1/2 inches, temperature 97.9, BP 102/58, pulse 65.  CHEST:  Lungs are clear to auscultation.  HEART:  No murmur.  Regular rate and rhythm, without murmur, gallop, or  rub.  ABDOMEN:  Nondistended.  Obese.  Positive bowel sounds.  Soft and  nontender.  No appreciable mass or organomegaly.   ASSESSMENT:  History of colon cancer status post low anterior resection  with adenoma subsequently removed.  She ought to have another  colonoscopy in 2010.  Reflux symptoms are well-controlled on acid  suppression.  Relative obesity is predisposing her to reflux.   I will  allow her to stay on Fosamax for now.  Will get her back on AcipHex 20  mg orally daily.  She should take this medication on a regular basis and  her reflux measures/literature  emphasized.  Should she be able to accomplish a 10-pound to 15-pound  weight loss between now and the end of the year, she could probably take  AcipHex every-other-day therapy or even come off and use H2 blockers and  antacids p.r.n.  Unless that becomes a problem, I plan to see the  patient back in one year.      Jonathon Bellows, M.D.  Electronically Signed     RMR/MEDQ  D:  08/28/2006  T:  08/28/2006  Job:  [id number]

## 2010-09-08 NOTE — Op Note (Signed)
NAME:  [name] [name], [name] [name]            ACCOUNT NO.:  [id number]   MEDICAL RECORD NO.:  [id number]          PATIENT TYPE:  AMB   LOCATION:  DAY                           FACILITY:  [facility]   PHYSICIAN:  R. Roetta Sessions, M.D. DATE OF BIRTH:  11-15-1940   DATE OF PROCEDURE:  01/01/2006  DATE OF DISCHARGE:                                 OPERATIVE REPORT   PROCEDURE:  Diagnostic colonoscopy.   INDICATION FOR PROCEDURE:  The patient is a 70 year old lady with a history  of colon cancer, status post low anterior resection previously.  Last  colonoscopy was 1 year, and she had a small adenoma at the anastomosis  removed.  She has had some intermittent low-volume hematochezia.  She is  known to have internal and external hemorrhoids.  She was given a course of  Anusol suppositories.  This has been associated with the resolution of her  bleeding.  Colonoscopy is now being done.  This approach has been discussed  with the patient at length, the potential risks, benefits, and alternatives  have been reviewed, questions answered.  She is agreeable.  Please see the  documentation in the medical record.   PROCEDURE NOTE:  O2 saturation, blood pressure, pulse, and respiration were  monitored throughout the entire procedure.   CONSCIOUS SEDATION:  Versed 3 mg IV, Demerol 75 mg IV in divided doses.   INSTRUMENT USED:  Olympus video colonoscope.   FINDINGS:  Digital rectal exam revealed external hemorrhoids.  Endoscopic  findings:  The prep was adequate.   Rectum:  Examination of the rectal mucosa including a retroflexed view of  the anal verge demonstrated internal and external hemorrhoids.  Surgical  anastomosis 10-12 cm.  Suture was protruding in the mucosa down at the  anastomosis.  Otherwise, the anastomosis appeared normal as did the rectal  mucosa.   Colon:  The colonic mucosa was surveyed from the anastomosis through the  more proximal colon, all the way to the cecum.  The cecum, ileocecal  valve  and appendiceal orifice were well-seen and photographed for the record.  From this level the scope was slowly withdrawn and all previously-mentioned  mucosal surfaces were again seen.  The patient had extensive diverticula.  There was no evidence of recurrent cancer or adenoma.  The patient tolerated  the procedure well, was reacted in endoscopy.   IMPRESSION:  1. Internal and external hemorrhoids, likely source of bleeding, otherwise      normal residual rectum, anastomosis at 10-12 cm.  2. The patient had extensive diverticulosis, otherwise the residual      colonic mucosa appeared normal.   RECOMMENDATIONS:  1. Diverticulosis and hemorrhoid literature provided to Ms. Iannone.      Since her rectal bleeding is  2. Since her rectal bleeding is resolved, no further GI evaluation is      warranted.  Plan for her to come back for surveillance colonoscopy in 3      years.      Jonathon Bellows, M.D.  Electronically Signed     RMR/MEDQ  D:  01/01/2006  T:  01/01/2006  Job:  [id number]   cc:   Corrie Mckusick, M.D.  Fax: [phone]

## 2010-09-08 NOTE — H&P (Signed)
NAME:  [name] [name], [name] [name]            ACCOUNT NO.:  [id number]   MEDICAL RECORD NO.:  [id number]          PATIENT TYPE:  AMB   LOCATION:                                FACILITY:  [facility]   PHYSICIAN:  R. Roetta Sessions, M.D. DATE OF BIRTH:  25-Mar-1941   DATE OF ADMISSION:  DATE OF DISCHARGE:  LH                                HISTORY & PHYSICAL   CHIEF COMPLAINT:  Intermittent rectal bleeding.   HISTORY OF PRESENT ILLNESS:  Mrs. Kurt is a 70 year old Caucasian  female who has a history of colorectal carcinoma, is status post low  anterior resection in 1999.  She had a colonoscopy on November 20, 2004, by Dr.  Jena Gauss.  Was found to have internal/external hemorrhoids and a diminutive 5  mm polyp at the surgical anastomotic site which was at 10 cm which came back  adenomatous.  She also was found to have pancolonic diverticula.  She tells  me she has had pink to reddish streaks after a bowel movement for several  weeks now, both on the toilet paper as well as in her panty liners.  She has  noticed some abdominal bloating and some mild abdominal pain.  She has  noticed increased flatus.  She has had some straining with stools.  She says  she has been more on the constipated side but generally has a bowel movement  every day.  Denies any diarrhea.  Her weight has remained stable.  She  denies any anorexia, denies any proctalgia or pruritus.   PAST MEDICAL HISTORY:  1. Inguinal hernia repair.  2. Vein stripping.  3. Appendectomy.  4. Colorectal carcinoma status post low anterior resection 1999.  5. Last colonoscopy last year as described in HPI.  6. Laparotomy and tubal ligation.  7. Type 2 diabetes mellitus.  8. Hypercholesterolemia.   CURRENT MEDICATIONS:  1. Xanax 0.5 mg daily.  2. Fosamax 70 mg weekly.  3. Multivitamin once daily.  4. Calcium, vitamin D and K once daily.  5. Tylenol p.r.n.  6. Albuterol p.r.n.  7. Vytorin 10/20 mg p.r.n.   ALLERGIES:  HYDROCODONE.   FAMILY  HISTORY:  Mother deceased secondary to a brain tumor at age 92.  Father had an MI at age 45.  There is no known family history of colorectal  carcinoma, liver or chronic GI problems in first degree relatives.  Two  cousins had colon cancer as well.   SOCIAL HISTORY:  Mrs. Pohlmann is married.  She has two children.  She is  retired.  She denies any tobacco, alcohol, or drug use.   REVIEW OF SYSTEMS:  CONSTITUTIONAL:  Weight remains stable.  Denies any  fevers or chills.  Denies any fatigue.  CARDIOVASCULAR:  Denies chest pain  or palpitations.  PULMONARY:  Denies any shortness of breath, dyspnea,  cough, hemoptysis.  GI:  See HPI.  Denies any heartburn, indigestion,  dysphagia, or odynophagia.   PHYSICAL EXAMINATION:  VITAL SIGNS:  Weight 163.5 pounds, height 63 inches,  temperature 98 degrees, blood pressure 110/68, and pulse 72.  GENERAL:  Mrs. Mcphail is a 70 year old Caucasian female  who is alert,  oriented, pleasant, and cooperative in no acute distress.  HEENT:  Sclerae clear, nonicteric.  Conjunctivae pink.  Oropharynx pink and  moist without any lesions.  NECK:  Supple without any thyromegaly.  CHEST:  Heart regular rate and rhythm.  Normal S1, S2 without murmurs,  clicks, rubs, or gallops.  LUNGS:  Clear to auscultation bilaterally.  ABDOMEN:  Positive bowel sounds x4.  No bruits auscultated.  Soft,  nontender, nondistended, without palpable mass or hepatosplenomegaly.  No  rebound tenderness or guarding.  EXTREMITY:  Without clubbing or edema bilaterally.  SKIN:  Pink, warm and dry without rash or jaundice.   ASSESSMENT:  Mrs. Brawley is a 70 year old Caucasian female with previous  history of colorectal carcinoma status post low anterior resection in 1999.  She had colonoscopy last year, was found to have a 5 mm adenomatous polyp at  the site of the anastomosis from the previous cancer.  I have discussed this  case with Dr. Karilyn Cota and since she is having intermittent  rectal bleeding  for several weeks now she is going to need further evaluation with  colonoscopy to determine the source of her bleeding.   PLAN:  1. Anusol HC suppositories one per rectum b.i.d. x10 days, no refills.  2. Will schedule colonoscopy with Dr. Jena Gauss in the near future.   I have discussed the procedure including the risks and benefits which  include but are not limited to bleeding, infection, perforation, drug  reaction.  She agrees to the plan and consent will be obtained.      Nicholas Lose, N.P.      Jonathon Bellows, M.D.  Electronically Signed    KC/MEDQ  D:  12/18/2005  T:  12/18/2005  Job:  [id number]   cc:   Corrie Mckusick, M.D.  Fax: [phone]

## 2010-09-08 NOTE — Consult Note (Signed)
NAME:  [name], [name]            ACCOUNT NO.:  [id number]   MEDICAL RECORD NO.:  [id number]          PATIENT TYPE:  AMB   LOCATION:  DAY                           FACILITY:  [facility]   PHYSICIAN:  R. Roetta Sessions, M.D. DATE OF BIRTH:  [birth year]   DATE OF CONSULTATION:  11/16/2004  DATE OF DISCHARGE:                                   CONSULTATION   REASON FOR CONSULTATION:  History of colon cancer and hematochezia, need for  colonoscopy.   HISTORY OF PRESENT ILLNESS:  [name] is a 70 year old lady seen  by the courtesy of [facility] for consideration of  colonoscopy.  [name] tells me she was found to have a colon cancer in  1999 by Dr. Heloise Purpura at [facility].  She underwent a  laparotomy with resection.  Apparently she had limited stage disease.  She  never saw a hematologist/oncologist.  She has had at least two subsequent  colonoscopies which were reportedly unremarkable.  Last seen some 3-4 years  ago by Dr. Cleotis Nipper.  She has had some intermittent blood per rectum here  recently in the setting of some constipation.  She is interested in having  another colonoscopy at this time.  Apparently, she was referred down to  [facility] and saw Dr. Victorino Dike last year for a clinically dilated bile  duct.  I have no records regarding this evaluation.   [name] has not had any abdominal pain.  She has had some recent  reflux symptoms for which she started on Prilosec by Dr. Phillips Odor with  excellent control of symptoms.  She does take Fosamax and has not had any  odynophagia or dysphagia.  Certainly no melena or weight loss.  No early  satiety.   PAST MEDICAL HISTORY:  1.  Significant for history of colon cancer.  2.  History of type 2 diabetes mellitus, apparently diet controlled.   PAST SURGICAL HISTORY:  1.  Inguinal hernia repair.  2.  Vein stripping.  3.  Appendectomy.  4.  Colon cancer.  5.  Laparotomy/tubal  ligation.   CURRENT MEDICATIONS:  1.  Xanax 0.5 mg once daily.  2.  Fosamax 70 mg weekly.  3.  One-A-Day vitamin daily.  4.  Calcium.  5.  Vitamin D and K daily.  6.  Tylenol p.r.n.  7.  Albuterol inhaler p.r.n.   ALLERGIES:  Hydrocodone causes a rash.   FAMILY HISTORY:  Mother succumb to brain cancer at age 46.  Father had a  heart attack at age 56.  There was no history of chronic GI or liver  illnesses aside from two cousins with colon cancer.  No first degree  relatives with colon cancer.   SOCIAL HISTORY:  The patient is married and has two children.  She is  retired from the [employer]  No tobacco or alcohol.  No illicit  drugs.   PHYSICAL EXAMINATION:  GENERAL APPEARANCE:  Pleasant 70 year old lady  resting comfortably.  VITAL SIGNS:  Weight 158, height 5 feet 3 inches, temperature 98.3, blood  pressure 122/68, pulse 64.  SKIN:  Warm  and dry.  No jaundice.  No continuous __________ chronic liver  disease  HEENT:  No scleral icterus.  Conjunctivae pink.  Oral cavity:  No lesions.  JVD is not prominent.  CHEST:  Lungs are clear to auscultation.  CARDIAC:  Regular rate and rhythm without murmurs, gallops, rubs.  ABDOMEN:  Nondistended, positive bowel sounds, soft, nontender without  appreciable mass or organomegaly.  EXTREMITIES:  No edema.  RECTAL:  Deferred until colonoscopy.   IMPRESSION:  [name] is a 70 year old lady with a history of  colon cancer and underwent laparotomy back in 1999 at [facility].  She is now having intermittent hematochezia.  She needs to have a  colonoscopy.  Her report of having a workup for dilated bile duct is very  interesting and would like to get records from both her colon cancer surgery  in 1999 and her biliary workup done one year ago for review.  I have  discussed potential risks, benefits, alternatives with [name],  questions were answered, she is agreeable.  We will set up a colonoscopy in  the  very near future at [facility] and make further recommendations  in the very near future.   I would like to thank Dr. Dorthey Sawyer for allowing me to assist this nice  lady.       RMR/MEDQ  D:  11/16/2004  T:  11/16/2004  Job:  [id number]   cc:   Corrie Mckusick, M.D.  Fax: [phone]

## 2011-02-28 ENCOUNTER — Telehealth: Payer: Self-pay | Admitting: Internal Medicine

## 2011-02-28 NOTE — Telephone Encounter (Signed)
Asking for something for hemorrhoids she is in pain/she has tried preparation H and baieol and its not helping/please advise??

## 2011-02-28 NOTE — Telephone Encounter (Signed)
Can call in anamantle forte cream disp 1 unit - apply to anorectum BID ; no refills

## 2011-02-28 NOTE — Telephone Encounter (Signed)
Routed to KJ 

## 2011-02-28 NOTE — Telephone Encounter (Signed)
Pt aware, rx called to [facility]

## 2011-03-05 ENCOUNTER — Other Ambulatory Visit (HOSPITAL_COMMUNITY): Payer: Self-pay | Admitting: Internal Medicine

## 2011-03-05 DIAGNOSIS — M810 Age-related osteoporosis without current pathological fracture: Secondary | ICD-10-CM

## 2011-03-09 ENCOUNTER — Ambulatory Visit (HOSPITAL_COMMUNITY)
Admission: RE | Admit: 2011-03-09 | Discharge: 2011-03-09 | Disposition: A | Discharge status: Home / Self Care | Payer: Medicare Other | Source: Ambulatory Visit | Attending: Internal Medicine | Admitting: Internal Medicine

## 2011-03-09 DIAGNOSIS — M810 Age-related osteoporosis without current pathological fracture: Secondary | ICD-10-CM

## 2011-03-09 DIAGNOSIS — Z780 Asymptomatic menopausal state: Secondary | ICD-10-CM | POA: Insufficient documentation

## 2011-03-09 DIAGNOSIS — M818 Other osteoporosis without current pathological fracture: Secondary | ICD-10-CM | POA: Insufficient documentation

## 2011-08-09 ENCOUNTER — Other Ambulatory Visit (HOSPITAL_COMMUNITY): Payer: Self-pay | Admitting: Internal Medicine

## 2011-08-09 DIAGNOSIS — Z139 Encounter for screening, unspecified: Secondary | ICD-10-CM

## 2011-08-13 ENCOUNTER — Ambulatory Visit (HOSPITAL_COMMUNITY): Payer: Medicare Other

## 2011-08-23 ENCOUNTER — Ambulatory Visit (HOSPITAL_COMMUNITY)
Admission: RE | Admit: 2011-08-23 | Discharge: 2011-08-23 | Disposition: A | Discharge status: Home / Self Care | Payer: Medicare Other | Source: Ambulatory Visit | Attending: Internal Medicine | Admitting: Internal Medicine

## 2011-08-23 ENCOUNTER — Other Ambulatory Visit (HOSPITAL_COMMUNITY): Payer: Self-pay | Admitting: Internal Medicine

## 2011-08-23 DIAGNOSIS — Z1231 Encounter for screening mammogram for malignant neoplasm of breast: Secondary | ICD-10-CM | POA: Insufficient documentation

## 2011-08-23 DIAGNOSIS — J322 Chronic ethmoidal sinusitis: Secondary | ICD-10-CM | POA: Insufficient documentation

## 2011-08-23 DIAGNOSIS — R51 Headache: Secondary | ICD-10-CM

## 2011-08-23 DIAGNOSIS — Z139 Encounter for screening, unspecified: Secondary | ICD-10-CM

## 2011-10-22 ENCOUNTER — Other Ambulatory Visit (HOSPITAL_COMMUNITY): Payer: Self-pay | Admitting: Family Medicine

## 2011-10-22 DIAGNOSIS — M79609 Pain in unspecified limb: Secondary | ICD-10-CM

## 2011-10-23 ENCOUNTER — Other Ambulatory Visit (HOSPITAL_COMMUNITY): Payer: Medicare Other

## 2011-10-23 ENCOUNTER — Ambulatory Visit (HOSPITAL_COMMUNITY)
Admission: RE | Admit: 2011-10-23 | Discharge: 2011-10-23 | Disposition: A | Discharge status: Home / Self Care | Payer: Medicare Other | Source: Ambulatory Visit | Attending: Family Medicine | Admitting: Family Medicine

## 2011-10-23 DIAGNOSIS — Z86718 Personal history of other venous thrombosis and embolism: Secondary | ICD-10-CM | POA: Insufficient documentation

## 2011-10-23 DIAGNOSIS — M79609 Pain in unspecified limb: Secondary | ICD-10-CM

## 2011-10-23 DIAGNOSIS — I803 Phlebitis and thrombophlebitis of lower extremities, unspecified: Secondary | ICD-10-CM | POA: Insufficient documentation

## 2012-04-01 ENCOUNTER — Encounter: Payer: Self-pay | Admitting: *Deleted

## 2012-05-23 ENCOUNTER — Encounter: Payer: Self-pay | Admitting: Internal Medicine

## 2012-05-23 ENCOUNTER — Encounter (HOSPITAL_COMMUNITY): Payer: Self-pay | Admitting: Pharmacy Technician

## 2012-05-23 ENCOUNTER — Ambulatory Visit (INDEPENDENT_AMBULATORY_CARE_PROVIDER_SITE_OTHER): Payer: Medicare Other | Admitting: Internal Medicine

## 2012-05-23 VITALS — BP 127/75 | HR 74 | Temp 98.2°F | Ht 62.0 in | Wt 156.0 lb

## 2012-05-23 DIAGNOSIS — K219 Gastro-esophageal reflux disease without esophagitis: Secondary | ICD-10-CM

## 2012-05-23 DIAGNOSIS — Z85038 Personal history of other malignant neoplasm of large intestine: Secondary | ICD-10-CM

## 2012-05-23 MED ORDER — PEG 3350-KCL-NA BICARB-NACL 420 G PO SOLR: 4000.0000 mL | ORAL | Status: DC

## 2012-05-23 NOTE — Progress Notes (Signed)
Primary Care Physician:  FUSCO,LAWRENCE J., MD Primary Gastroenterologist:  Dr. Symir Mah  Pre-Procedure History & Physical: HPI:  [name] [name] is a 71 y.o. female here for followup colon cancer and GERD. Status post low anterior resection back in 1999. In limited stage disease requiring no adjuvant therapy. Last colonoscopy 2009 - pancolonic diverticulosis. Says she passed a little blood last fall but didn't tell anyone. No subsequent bleeding.  Occasionally constipated. Long-standing GERD. No significant dysphagia having had her esophagus dilated previously-cervical esophageal web, Schatzki's ring. Takes omeprazole 20 mg daily. Has missed a dose along the way cannot tell any difference in her symptoms. Has osteoporosis-on Fosamax. She reports her bone density study 2 years ago did not show any improvement in bone density. Past Medical History  Diagnosis Date  . Schatzki's ring   . Hematuria   . Hemorrhoids   . GERD (gastroesophageal reflux disease)   . DM (dermatomyositis)   . Hypercholesterolemia   . Diverticulosis   . Esophageal dysphagia   . Colon cancer     status post low anterior resection, limited stage disease requiring no adjuvant therapy  . Hiatal hernia   . Colon adenoma     Past Surgical History  Procedure Date  . Colonoscopy 11/09    Dr. Elliott Quade- external hemorrhoidal tags o/w normal rectal mucosa, s/p surgical resection with a normal appearing anastomosis 12cm, pan colonic diverticulum  . Ingunal hernia repair   . Tubal ligation   . Low anterior bowel resection     NO ADJ CHEMO  . Appendectomy   . Vein ligation and stripping   . Esophagogastroduodenoscopy 07/2007    Dr. Wesam Gearhart-probable cervical esophageal web, schatzki ring, large hiatal hernia    Prior to Admission medications   Medication Sig Start Date End Date Taking? Authorizing Provider  albuterol (PROVENTIL, VENTOLIN) (5 MG/ML) 0.5% NEBU Take 5 mg/hr by nebulization as needed.     Yes Historical Provider, MD   alendronate (FOSAMAX) 70 MG tablet Take 70 mg by mouth every 7 (seven) days. Take with a full glass of water on an empty stomach.    Yes Historical Provider, MD  ALPRAZolam (XANAX) 0.5 MG tablet Take 0.5 mg by mouth at bedtime as needed.     Yes Historical Provider, MD  calcium carbonate (TUMS - DOSED IN MG ELEMENTAL CALCIUM) 500 MG chewable tablet Chew 1 tablet by mouth daily.     Yes Historical Provider, MD  ezetimibe-simvastatin (VYTORIN) 10-20 MG per tablet Take 1 tablet by mouth at bedtime.     Yes Historical Provider, MD  FIBER DIET PO Take by mouth.     Yes Historical Provider, MD  hydrocortisone-pramoxine (PROCTOFOAM HC) rectal foam Place 1 applicator rectally 2 (two) times daily.     Yes Historical Provider, MD  Multiple Vitamin (MULTIVITAMIN) tablet Take 1 tablet by mouth daily.     Yes Historical Provider, MD  omeprazole (PRILOSEC) 20 MG capsule Take 20 mg by mouth daily.     Yes Historical Provider, MD    Allergies as of 05/23/2012 - Review Complete 05/23/2012  Allergen Reaction Noted  . Hydrocodone-acetaminophen  04/26/2010  . Iohexol  07/16/2008  . Nabumetone  04/26/2010    No family history on file.  History   Social History  . Marital Status: Married    Spouse Name: N/A    Number of Children: N/A  . Years of Education: N/A   Occupational History  . Not on file.   Social History Main Topics  .   Smoking status: Never Smoker   . Smokeless tobacco: Not on file  . Alcohol Use: No  . Drug Use: No  . Sexually Active: Not on file   Other Topics Concern  . Not on file   Social History Narrative  . No narrative on file    Review of Systems: See HPI, otherwise negative ROS  Physical Exam: BP 127/75  Pulse 74  Temp 98.2 F (36.8 C) (Oral)  Ht 5' 2" (1.575 m)  Wt 156 lb (70.761 kg)  BMI 28.53 kg/m2 General:   Alert,  Well-developed, well-nourished, pleasant and cooperative in NAD Skin:  Intact without significant lesions or rashes. Eyes:  Sclera clear, no  icterus.   Conjunctiva pink. Ears:  Normal auditory acuity. Nose:  No deformity, discharge,  or lesions. Mouth:  No deformity or lesions. Neck:  Supple; no masses or thyromegaly. No significant cervical adenopathy. Lungs:  Clear throughout to auscultation.   No wheezes, crackles, or rhonchi. No acute distress. Heart:  Regular rate and rhythm; no murmurs, clicks, rubs,  or gallops. Abdomen: Non-distended, normal bowel sounds.  Well-healed midline laparotomy scar. soft and nontender without appreciable mass or hepatosplenomegaly.  Pulses:  Normal pulses noted. Extremities:  Without clubbing or edema.  Impression/Plan:  71-year-old lady with a history of limited stage: Record cancer status post low anterior resection. Doing well. He passed some blood per rectum last fall but this was apparently self limiting. She is due for surveillance colonoscopy at this time.  GERD symptoms seem not be probably stays on omeprazole 20 mg orally daily. We talked about the benefits versus the risks and him at some increased risk of osteoporosis or loss of bone mineralization with chronic acid suppression therapy.    Recommendations:    Surveillance colonoscopy in the near future.The risks, benefits, limitations, alternatives and imponderables have been reviewed with the patient. Questions have been answered. All parties are agreeable. I have asked her to drop back on omeprazole to 20 mg every other day and see if this controls her reflux symptoms. If she can't tell any difference on this regimen, would have her cut back further to a when necessary use.  Swallowing precautions again reviewed.  Further recommendations to follow pending findings of colonoscopy.. 

## 2012-05-23 NOTE — Patient Instructions (Addendum)
Schedule a surveillance colonoscopy now - history for colon cancer  Continue Prilosec but try taking it every other day because of your history of osteoporosis

## 2012-06-02 ENCOUNTER — Ambulatory Visit (HOSPITAL_COMMUNITY)
Admission: RE | Admit: 2012-06-02 | Discharge: 2012-06-02 | Disposition: A | Discharge status: Home / Self Care | Payer: Medicare Other | Source: Ambulatory Visit | Attending: Internal Medicine | Admitting: Internal Medicine

## 2012-06-02 ENCOUNTER — Encounter (HOSPITAL_COMMUNITY): Payer: Self-pay | Admitting: *Deleted

## 2012-06-02 ENCOUNTER — Encounter (HOSPITAL_COMMUNITY)
Admission: RE | Disposition: A | Discharge status: Home / Self Care | Payer: Self-pay | Source: Ambulatory Visit | Attending: Internal Medicine

## 2012-06-02 DIAGNOSIS — Z85048 Personal history of other malignant neoplasm of rectum, rectosigmoid junction, and anus: Secondary | ICD-10-CM

## 2012-06-02 DIAGNOSIS — Z9049 Acquired absence of other specified parts of digestive tract: Secondary | ICD-10-CM | POA: Insufficient documentation

## 2012-06-02 DIAGNOSIS — D126 Benign neoplasm of colon, unspecified: Secondary | ICD-10-CM | POA: Insufficient documentation

## 2012-06-02 DIAGNOSIS — K5730 Diverticulosis of large intestine without perforation or abscess without bleeding: Secondary | ICD-10-CM

## 2012-06-02 DIAGNOSIS — E119 Type 2 diabetes mellitus without complications: Secondary | ICD-10-CM | POA: Insufficient documentation

## 2012-06-02 DIAGNOSIS — Z85038 Personal history of other malignant neoplasm of large intestine: Secondary | ICD-10-CM | POA: Insufficient documentation

## 2012-06-02 DIAGNOSIS — Z1211 Encounter for screening for malignant neoplasm of colon: Secondary | ICD-10-CM

## 2012-06-02 HISTORY — PX: COLONOSCOPY: SHX5424

## 2012-06-02 SURGERY — COLONOSCOPY
Anesthesia: Moderate Sedation

## 2012-06-02 MED ORDER — MEPERIDINE HCL 100 MG/ML IJ SOLN
INTRAMUSCULAR | Status: DC | PRN
  Administered 2012-06-02: 50 mg via INTRAVENOUS
  Administered 2012-06-02: 25 mg via INTRAVENOUS

## 2012-06-02 MED ORDER — ONDANSETRON HCL 4 MG/2ML IJ SOLN
INTRAMUSCULAR | Status: AC
  Filled 2012-06-02: qty 2

## 2012-06-02 MED ORDER — MIDAZOLAM HCL 5 MG/5ML IJ SOLN
INTRAMUSCULAR | Status: AC
  Filled 2012-06-02: qty 10

## 2012-06-02 MED ORDER — SODIUM CHLORIDE 0.45 % IV SOLN
INTRAVENOUS | Status: DC
  Administered 2012-06-02: 10:00:00 via INTRAVENOUS

## 2012-06-02 MED ORDER — MIDAZOLAM HCL 5 MG/5ML IJ SOLN
INTRAMUSCULAR | Status: DC | PRN
  Administered 2012-06-02 (×2): 1 mg via INTRAVENOUS
  Administered 2012-06-02: 2 mg via INTRAVENOUS

## 2012-06-02 MED ORDER — MEPERIDINE HCL 100 MG/ML IJ SOLN
INTRAMUSCULAR | Status: AC
  Filled 2012-06-02: qty 1

## 2012-06-02 MED ORDER — ONDANSETRON HCL 4 MG/2ML IJ SOLN
INTRAMUSCULAR | Status: DC | PRN
  Administered 2012-06-02: 4 mg via INTRAVENOUS

## 2012-06-02 MED ORDER — STERILE WATER FOR IRRIGATION IR SOLN
Status: DC | PRN
  Administered 2012-06-02: 10:00:00

## 2012-06-02 NOTE — Op Note (Signed)
[facility] [address] [place], [address]   COLONOSCOPY PROCEDURE REPORT  PATIENT: [name], [name]  MR#:         [id number] BIRTHDATE: 15-Dec-1940 , 71  yrs. old GENDER: Female ENDOSCOPIST: R.  Roetta Sessions, MD FACP FACG REFERRED BY:  Artis Delay, M.D. PROCEDURE DATE:  06/02/2012 PROCEDURE:     Ileocolonoscopy with snare polypectomy  INDICATIONS: history of colorectal cancer; status post low anterior resection  INFORMED CONSENT:  The risks, benefits, alternatives and imponderables including but not limited to bleeding, perforation as well as the possibility of a missed lesion have been reviewed.  The potential for biopsy, lesion removal, etc. have also been discussed.  Questions have been answered.  All parties agreeable. Please see the history and physical in the medical record for more information.  MEDICATIONS: Versed 4 mg IV and Demerol 75 mg IV in divided doses. Zofran 4 mg IV the  DESCRIPTION OF PROCEDURE:  After a digital rectal exam was performed, the Pentax Colonoscope [id number]  colonoscope was advanced from the anus through the rectum and colon to the area of the cecum, ileocecal valve and appendiceal orifice.  The cecum was deeply intubated.  These structures were well-seen and photographed for the record.  From the level of the cecum and ileocecal valve, the scope was slowly and cautiously withdrawn.  The mucosal surfaces were carefully surveyed utilizing scope tip deflection to facilitate fold flattening as needed.  The scope was pulled down into the rectum where a thorough examination including retroflexion was performed.    FINDINGS:  Adequate preparation. Surgical anastomosis identified at 10-12 cm from the anal verge. Residual rectal mucosa appeared normal. Pancolonic diverticula; (1) 4 mm polyp at the hepatic flexure; otherwise, the remainder of the residual colon mucosa appeared normal. The distal 10 cm of terminal ileual mucosa  also appeared normal.  THERAPEUTIC / DIAGNOSTIC MANEUVERS PERFORMED:  The above-mentioned polyp was cold snare removed and recovered for the pathologist.  COMPLICATIONS: None  CECAL WITHDRAWAL TIME:  10 minutes  IMPRESSION:  Status post low anterior resection. Pancolonic diverticulosis. Colonic polyp-removed as described above  RECOMMENDATIONS: Followup on pathology.   _______________________________ eSigned:  R. Roetta Sessions, MD FACP [facility] 06/02/2012 10:58 AM   CC:    PATIENT NAME:  [name], [name] MR#: [id number]

## 2012-06-02 NOTE — Interval H&P Note (Signed)
History and Physical Interval Note:  06/02/2012 10:09 AM  [name] [name]  has presented today for surgery, with the diagnosis of History of colon rectal cancer  The various methods of treatment have been discussed with the patient and family. After consideration of risks, benefits and other options for treatment, the patient has consented to  Procedure(s) with comments: COLONOSCOPY (N/A) - 10:30 as a surgical intervention .  The patient's history has been reviewed, patient examined, no change in status, stable for surgery.  I have reviewed the patient's chart and labs.  Questions were answered to the patient's satisfaction.     [name] [name]  Colonoscopy today per plan. The risks, benefits, limitations, alternatives and imponderables have been reviewed with the patient. Questions have been answered. All parties are agreeable.

## 2012-06-02 NOTE — H&P (View-Only) (Signed)
Primary Care Physician:  Cassell Smiles., MD Primary Gastroenterologist:  Dr. Jena Gauss  Pre-Procedure History & Physical: HPI:  [name] [name] is a 72 y.o. female here for followup colon cancer and GERD. Status post low anterior resection back in 1999. In limited stage disease requiring no adjuvant therapy. Last colonoscopy 2009 - pancolonic diverticulosis. Says she passed a little blood last fall but didn't tell anyone. No subsequent bleeding.  Occasionally constipated. Long-standing GERD. No significant dysphagia having had her esophagus dilated previously-cervical esophageal web, Schatzki's ring. Takes omeprazole 20 mg daily. Has missed a dose along the way cannot tell any difference in her symptoms. Has osteoporosis-on Fosamax. She reports her bone density study 2 years ago did not show any improvement in bone density. Past Medical History  Diagnosis Date  . Schatzki's ring   . Hematuria   . Hemorrhoids   . GERD (gastroesophageal reflux disease)   . DM (dermatomyositis)   . Hypercholesterolemia   . Diverticulosis   . Esophageal dysphagia   . Colon cancer     status post low anterior resection, limited stage disease requiring no adjuvant therapy  . Hiatal hernia   . Colon adenoma     Past Surgical History  Procedure Date  . Colonoscopy 11/09    Dr. Jena Gauss- external hemorrhoidal tags o/w normal rectal mucosa, s/p surgical resection with a normal appearing anastomosis 12cm, pan colonic diverticulum  . Ingunal hernia repair   . Tubal ligation   . Low anterior bowel resection     NO ADJ CHEMO  . Appendectomy   . Vein ligation and stripping   . Esophagogastroduodenoscopy 07/2007    Dr. Carron Curie cervical esophageal web, schatzki ring, large hiatal hernia    Prior to Admission medications   Medication Sig Start Date End Date Taking? Authorizing Provider  albuterol (PROVENTIL, VENTOLIN) (5 MG/ML) 0.5% NEBU Take 5 mg/hr by nebulization as needed.     Yes Historical Provider, MD   alendronate (FOSAMAX) 70 MG tablet Take 70 mg by mouth every 7 (seven) days. Take with a full glass of water on an empty stomach.    Yes Historical Provider, MD  ALPRAZolam Prudy Feeler) 0.5 MG tablet Take 0.5 mg by mouth at bedtime as needed.     Yes Historical Provider, MD  calcium carbonate (TUMS - DOSED IN MG ELEMENTAL CALCIUM) 500 MG chewable tablet Chew 1 tablet by mouth daily.     Yes Historical Provider, MD  ezetimibe-simvastatin (VYTORIN) 10-20 MG per tablet Take 1 tablet by mouth at bedtime.     Yes Historical Provider, MD  FIBER DIET PO Take by mouth.     Yes Historical Provider, MD  hydrocortisone-pramoxine (PROCTOFOAM HC) rectal foam Place 1 applicator rectally 2 (two) times daily.     Yes Historical Provider, MD  Multiple Vitamin (MULTIVITAMIN) tablet Take 1 tablet by mouth daily.     Yes Historical Provider, MD  omeprazole (PRILOSEC) 20 MG capsule Take 20 mg by mouth daily.     Yes Historical Provider, MD    Allergies as of 05/23/2012 - Review Complete 05/23/2012  Allergen Reaction Noted  . Hydrocodone-acetaminophen  04/26/2010  . Iohexol  07/16/2008  . Nabumetone  04/26/2010    No family history on file.  History   Social History  . Marital Status: Married    Spouse Name: N/A    Number of Children: N/A  . Years of Education: N/A   Occupational History  . Not on file.   Social History Main Topics  .  Smoking status: Never Smoker   . Smokeless tobacco: Not on file  . Alcohol Use: No  . Drug Use: No  . Sexually Active: Not on file   Other Topics Concern  . Not on file   Social History Narrative  . No narrative on file    Review of Systems: See HPI, otherwise negative ROS  Physical Exam: BP 127/75  Pulse 74  Temp 98.2 F (36.8 C) (Oral)  Ht 5\' 2"  (1.575 m)  Wt 156 lb (70.761 kg)  BMI 28.53 kg/m2 General:   Alert,  Well-developed, well-nourished, pleasant and cooperative in NAD Skin:  Intact without significant lesions or rashes. Eyes:  Sclera clear, no  icterus.   Conjunctiva pink. Ears:  Normal auditory acuity. Nose:  No deformity, discharge,  or lesions. Mouth:  No deformity or lesions. Neck:  Supple; no masses or thyromegaly. No significant cervical adenopathy. Lungs:  Clear throughout to auscultation.   No wheezes, crackles, or rhonchi. No acute distress. Heart:  Regular rate and rhythm; no murmurs, clicks, rubs,  or gallops. Abdomen: Non-distended, normal bowel sounds.  Well-healed midline laparotomy scar. soft and nontender without appreciable mass or hepatosplenomegaly.  Pulses:  Normal pulses noted. Extremities:  Without clubbing or edema.  Impression/Plan:  72 year old lady with a history of limited stage: Record cancer status post low anterior resection. Doing well. He passed some blood per rectum last fall but this was apparently self limiting. She is due for surveillance colonoscopy at this time.  GERD symptoms seem not be probably stays on omeprazole 20 mg orally daily. We talked about the benefits versus the risks and him at some increased risk of osteoporosis or loss of bone mineralization with chronic acid suppression therapy.    Recommendations:    Surveillance colonoscopy in the near future.The risks, benefits, limitations, alternatives and imponderables have been reviewed with the patient. Questions have been answered. All parties are agreeable. I have asked her to drop back on omeprazole to 20 mg every other day and see if this controls her reflux symptoms. If she can't tell any difference on this regimen, would have her cut back further to a when necessary use.  Swallowing precautions again reviewed.  Further recommendations to follow pending findings of colonoscopy.Marland Kitchen

## 2012-06-04 ENCOUNTER — Encounter: Payer: Self-pay | Admitting: *Deleted

## 2012-06-04 ENCOUNTER — Encounter: Payer: Self-pay | Admitting: Internal Medicine

## 2012-06-05 ENCOUNTER — Encounter (HOSPITAL_COMMUNITY): Payer: Self-pay | Admitting: Internal Medicine

## 2012-06-07 ENCOUNTER — Telehealth (INDEPENDENT_AMBULATORY_CARE_PROVIDER_SITE_OTHER): Payer: Self-pay | Admitting: Internal Medicine

## 2012-06-07 ENCOUNTER — Other Ambulatory Visit (INDEPENDENT_AMBULATORY_CARE_PROVIDER_SITE_OTHER): Payer: Self-pay | Admitting: Internal Medicine

## 2012-06-07 MED ORDER — NYSTATIN-TRIAMCINOLONE 100000-0.1 UNIT/GM-% EX CREA: TOPICAL_CREAM | Freq: Two times a day (BID) | CUTANEOUS | Status: DC

## 2012-06-07 NOTE — Telephone Encounter (Signed)
Patient called stating she was constipated. She had a colonoscopy few days ago by Dr. Jena Gauss. She also noted to some discomfort due to swollen hemorrhoids. Patient advised to take MiraLax half to one scoop daily. Prescription for Mycolog-II cream sent to her pharmacy.

## 2012-06-09 ENCOUNTER — Telehealth: Payer: Self-pay | Admitting: *Deleted

## 2012-06-09 NOTE — Telephone Encounter (Signed)
Spoke with pt- she called the hospital on Saturday and spoke with NUR, she is using the cream that NUR sent to the pharmacy and the miralax. She is not having any pain, she is just noticing some "pink" on the tissue when she wipes. She is not noticing any other problems. Warned against constipation, she said with the weather she wasn't paying attention to the fact that she hadnt had a bowel movement.Advised pt that she may need to use the cream a few more days to make sure that the hemorrhoid is healing. She agreed. I told her that I would let RMR know what is going on and if he has any further recommendations I would let her know.

## 2012-06-09 NOTE — Telephone Encounter (Signed)
[name] [name] called this am. She called over the weekend and spoke with the on call Dr about her constipation and is now spotting a small amount of blood. She would like a call back. Thank you.

## 2012-07-23 ENCOUNTER — Telehealth: Payer: Self-pay | Admitting: Internal Medicine

## 2012-07-23 NOTE — Telephone Encounter (Signed)
I agree. Would have a low threshold for taking MiraLax every day just to prevent constipation. Would continue daily as long as not having out and out diarrhea

## 2012-07-23 NOTE — Telephone Encounter (Signed)
Pt had tcs in 05/2012 by RMR and noticed this morning that she had blood in her stool. Stated it started this morning. Please advise. [phone]

## 2012-07-23 NOTE — Telephone Encounter (Signed)
Spoke with pt- she was constipated yesterday and took some miralax and had bm this morning, she was straining and saw some BR blood in her stool. No pain , no N/V, no fever. Pt stated she feels good now. She is not taking miralax daily, only prn. She did have some of the mycolog cream left over that NUR had sent in for her and she is using that on her hemorrhoids.  Advised pt that she should try to take miralax daily (stop if she develops diarrhea)    and extra fiber to avoid constipation. Also advised her to try to avoid straining. Pt verbalized understanding and said if RMR has any further recommendations we can call her back later.

## 2012-08-28 ENCOUNTER — Other Ambulatory Visit (HOSPITAL_COMMUNITY): Payer: Self-pay | Admitting: Internal Medicine

## 2012-08-28 DIAGNOSIS — Z139 Encounter for screening, unspecified: Secondary | ICD-10-CM

## 2012-09-16 ENCOUNTER — Ambulatory Visit (HOSPITAL_COMMUNITY): Payer: Medicare Other

## 2012-09-25 ENCOUNTER — Ambulatory Visit (HOSPITAL_COMMUNITY)
Admission: RE | Admit: 2012-09-25 | Discharge: 2012-09-25 | Disposition: A | Discharge status: Home / Self Care | Payer: Medicare Other | Source: Ambulatory Visit | Attending: Internal Medicine | Admitting: Internal Medicine

## 2012-09-25 DIAGNOSIS — Z1231 Encounter for screening mammogram for malignant neoplasm of breast: Secondary | ICD-10-CM | POA: Insufficient documentation

## 2012-09-25 DIAGNOSIS — Z139 Encounter for screening, unspecified: Secondary | ICD-10-CM

## 2012-12-15 ENCOUNTER — Encounter: Payer: Self-pay | Admitting: Internal Medicine

## 2012-12-16 ENCOUNTER — Encounter: Payer: Self-pay | Admitting: Gastroenterology

## 2012-12-16 ENCOUNTER — Ambulatory Visit (INDEPENDENT_AMBULATORY_CARE_PROVIDER_SITE_OTHER): Payer: Medicare Other | Admitting: Gastroenterology

## 2012-12-16 VITALS — BP 131/78 | HR 68 | Temp 97.2°F | Ht 62.0 in | Wt 155.4 lb

## 2012-12-16 DIAGNOSIS — R1011 Right upper quadrant pain: Secondary | ICD-10-CM

## 2012-12-16 DIAGNOSIS — R399 Unspecified symptoms and signs involving the genitourinary system: Secondary | ICD-10-CM

## 2012-12-16 DIAGNOSIS — R3989 Other symptoms and signs involving the genitourinary system: Secondary | ICD-10-CM

## 2012-12-16 DIAGNOSIS — K5900 Constipation, unspecified: Secondary | ICD-10-CM

## 2012-12-16 LAB — HEPATIC FUNCTION PANEL
Albumin: 4.4 g/dL (ref 3.5–5.2)
Alkaline Phosphatase: 90 U/L (ref 39–117)
Bilirubin, Direct: 0.1 mg/dL (ref 0.0–0.3)
Total Bilirubin: 0.4 mg/dL (ref 0.3–1.2)

## 2012-12-16 MED ORDER — LINACLOTIDE 145 MCG PO CAPS: 145.0000 ug | ORAL_CAPSULE | Freq: Every day | ORAL | Status: DC

## 2012-12-16 NOTE — Progress Notes (Signed)
Referring Provider: Elfredia Nevins, MD Primary Care Physician:  Cassell Smiles., MD Primary GI: Dr. Jena Gauss   Chief Complaint  Patient presents with  . Abdominal Pain    HPI:   [name] [name] is a pleasant 72 year old female with a history of GERD and colon cancer, s/p LAR in 1999. Her surveillance colonoscopy is up-to-date and due again in 2019.  She reports onset of intermittent RUQ pain last month, coming every few days. Described as sharp. Unsure if related to eating/drinking. No associated nausea or vomiting. Good appetite. Notes occasional constipation, forgets to take fiber. Then she has to "go all day". Could go up to 2-3 days without a BM. Forgets to take Metamucil. Omeprazole prn.   After urinating,will have a pain lower abdomen. Vague abdominal discomfort after urinating.   Past Medical History  Diagnosis Date  . Schatzki's ring   . Hematuria   . Hemorrhoids   . GERD (gastroesophageal reflux disease)   . DM (dermatomyositis)   . Hypercholesterolemia   . Diverticulosis   . Esophageal dysphagia   . Colon cancer     status post low anterior resection, limited stage disease requiring no adjuvant therapy  . Hiatal hernia   . Colon adenoma     Past Surgical History  Procedure Laterality Date  . Colonoscopy  11/09    Dr. Jena Gauss- external hemorrhoidal tags o/w normal rectal mucosa, s/p surgical resection with a normal appearing anastomosis 12cm, pan colonic diverticulum  . Ingunal hernia repair    . Tubal ligation    . Low anterior bowel resection      NO ADJ CHEMO  . Appendectomy    . Vein ligation and stripping    . Esophagogastroduodenoscopy  07/2007    Dr. Carron Curie cervical esophageal web, schatzki ring, large hiatal hernia  . Colonoscopy N/A 06/02/2012    [id number] post low anterior resection. Pancolonic diverticulosis. Colonic polyp-tubular adenoma. Surveillance due 2019.     Current Outpatient Prescriptions  Medication Sig Dispense Refill  .  acetaminophen (TYLENOL) 325 MG tablet Take 650 mg by mouth every 6 (six) hours as needed for pain.      Marland Kitchen albuterol (PROVENTIL HFA;VENTOLIN HFA) 108 (90 BASE) MCG/ACT inhaler Inhale 2 puffs into the lungs every 6 (six) hours as needed for wheezing.      Marland Kitchen albuterol (PROVENTIL, VENTOLIN) (5 MG/ML) 0.5% NEBU Take 5 mg/hr by nebulization as needed.        Marland Kitchen alendronate (FOSAMAX) 35 MG tablet Take 35 mg by mouth every 7 (seven) days.       . ALPRAZolam (XANAX) 0.5 MG tablet Take 0.5 mg by mouth at bedtime as needed. Anxiety/sleep.      . calcium carbonate (TUMS - DOSED IN MG ELEMENTAL CALCIUM) 500 MG chewable tablet Chew 1 tablet by mouth daily.        . cetirizine (ZYRTEC) 5 MG tablet Take 5 mg by mouth daily.      Marland Kitchen ezetimibe-simvastatin (VYTORIN) 10-20 MG per tablet Take 1 tablet by mouth at bedtime.        . Multiple Vitamin (MULTIVITAMIN) tablet Take 1 tablet by mouth daily.        Marland Kitchen omeprazole (PRILOSEC) 20 MG capsule Take 20 mg by mouth daily.        . Linaclotide (LINZESS) 145 MCG CAPS capsule Take 1 capsule (145 mcg total) by mouth daily. Take 30 minutes before breakfast daily.  30 capsule  3   No current facility-administered medications for this visit.  Allergies as of 12/16/2012 - Review Complete 12/16/2012  Allergen Reaction Noted  . Hydrocodone-acetaminophen  04/26/2010  . Iohexol  07/16/2008  . Nabumetone  04/26/2010    No family history on file.  History   Social History  . Marital Status: Married    Spouse Name: N/A    Number of Children: N/A  . Years of Education: N/A   Social History Main Topics  . Smoking status: Never Smoker   . Smokeless tobacco: None  . Alcohol Use: No  . Drug Use: No  . Sexual Activity: None   Other Topics Concern  . None   Social History Narrative  . None    Review of Systems: Negative unless mentioned in HPI.   Physical Exam: BP 131/78  Pulse 68  Temp(Src) 97.2 F (36.2 C) (Oral)  Ht 5\' 2"  (1.575 m)  Wt 155 lb 6.4 oz  (70.489 kg)  BMI 28.42 kg/m2 General:   Alert and oriented. No distress noted. Pleasant and cooperative.  Head:  Normocephalic and atraumatic. Eyes:  Conjuctiva clear without scleral icterus. Mouth:  Oral mucosa pink and moist. Good dentition. No lesions. Heart:  S1, S2 present without murmurs, rubs, or gallops. Regular rate and rhythm. Abdomen:  +BS, soft, non-tender and non-distended. No rebound or guarding. No HSM or masses noted. Msk:  Symmetrical without gross deformities. Normal posture. Extremities:  Without edema. Neurologic:  Alert and  oriented x4;  grossly normal neurologically. Skin:  Intact without significant lesions or rashes. Cervical Nodes:  No significant cervical adenopathy. Psych:  Alert and cooperative. Normal mood and affect.

## 2012-12-16 NOTE — Assessment & Plan Note (Signed)
72 year old female presents with vague reports of RUQ discomfort without any relieving or associated factors; last EGD in 2009. Gallbladder remains in situ. No up-to-date imaging available. I would like to obtain a set of LFTs and proceed with an [name] of abdomen. She does have underlying constipation that may or may not be playing a role in this scenario. Will start her on Linzess 145 mcg daily as well. See constipation. Return in 4-6 weeks, with further recommendations to follow once imaging and bloodwork completed.

## 2012-12-16 NOTE — Assessment & Plan Note (Signed)
Check UA with culture. 

## 2012-12-16 NOTE — Assessment & Plan Note (Signed)
Start Linzess 145 mcg daily. Colonoscopy up-to-date. Due to hx of colon cancer in remote past, needs surveillance again in 2019.

## 2012-12-16 NOTE — Patient Instructions (Addendum)
Please complete the blood work and urine sample. We have also scheduled you for an ultrasound of your belly.   Start taking Linzess 1 capsule 30 minutes before breakfast daily. This is for constipation. If your insurance does not cover this, we can try a different option. Please let us know if it does not.   We will see you in 4 -6 weeks!

## 2012-12-17 ENCOUNTER — Telehealth: Payer: Self-pay | Admitting: Internal Medicine

## 2012-12-17 LAB — URINALYSIS W MICROSCOPIC + REFLEX CULTURE
Bacteria, UA: NONE SEEN
Casts: NONE SEEN
Crystals: NONE SEEN
Glucose, UA: NEGATIVE mg/dL
Ketones, ur: NEGATIVE mg/dL
Leukocytes, UA: NEGATIVE
Nitrite: NEGATIVE
Specific Gravity, Urine: 1.013 (ref 1.005–1.030)
pH: 7.5 (ref 5.0–8.0)

## 2012-12-17 NOTE — Telephone Encounter (Signed)
Routing to AS. Rx is Linzess.

## 2012-12-17 NOTE — Telephone Encounter (Signed)
Pt called this afternoon to speak with AS. She was seen on 8/26 and her prescription will cost her $300. Please advise and call patient [phone]

## 2012-12-17 NOTE — Progress Notes (Signed)
CC'd to PCP 

## 2012-12-18 MED ORDER — LUBIPROSTONE 8 MCG PO CAPS: 8.0000 ug | ORAL_CAPSULE | Freq: Two times a day (BID) | ORAL | Status: DC

## 2012-12-18 NOTE — Telephone Encounter (Signed)
Trial of Amitiza BID sent to pharmacy.

## 2012-12-18 NOTE — Addendum Note (Signed)
Addended by: Nira Retort on: 12/18/2012 11:59 AM   Modules accepted: Orders

## 2012-12-19 ENCOUNTER — Ambulatory Visit (HOSPITAL_COMMUNITY)
Admission: RE | Admit: 2012-12-19 | Discharge: 2012-12-19 | Disposition: A | Discharge status: Home / Self Care | Payer: Medicare Other | Source: Ambulatory Visit | Attending: Gastroenterology | Admitting: Gastroenterology

## 2012-12-19 DIAGNOSIS — R1011 Right upper quadrant pain: Secondary | ICD-10-CM | POA: Insufficient documentation

## 2012-12-23 NOTE — Telephone Encounter (Signed)
Tried to call to inform pt- NA

## 2012-12-23 NOTE — Telephone Encounter (Signed)
Pt is aware and she is aware to take medication with food.

## 2012-12-26 NOTE — Progress Notes (Signed)
Quick Note:  Normal [name]. HFP normal. If persistent RUQ pain despite treatment of constipation, needs HIDA before next visit.  Urinalysis was inconclusive. I recommend referral to Urology if she continues to have discomfort after urinating. ______

## 2013-01-06 NOTE — Progress Notes (Signed)
Quick Note:  Tried to call pt- NA ______ 

## 2013-01-07 ENCOUNTER — Other Ambulatory Visit: Payer: Self-pay | Admitting: Gastroenterology

## 2013-01-07 DIAGNOSIS — R1011 Right upper quadrant pain: Secondary | ICD-10-CM

## 2013-01-07 NOTE — Progress Notes (Signed)
Quick Note:  Yes, proceed with HIDA scan. ______

## 2013-01-12 ENCOUNTER — Encounter (HOSPITAL_COMMUNITY): Payer: Self-pay

## 2013-01-12 ENCOUNTER — Encounter (HOSPITAL_COMMUNITY)
Admission: RE | Admit: 2013-01-12 | Discharge: 2013-01-12 | Disposition: A | Discharge status: Home / Self Care | Payer: Medicare Other | Source: Ambulatory Visit | Attending: Gastroenterology | Admitting: Gastroenterology

## 2013-01-12 DIAGNOSIS — R1011 Right upper quadrant pain: Secondary | ICD-10-CM | POA: Insufficient documentation

## 2013-01-12 HISTORY — DX: Unspecified asthma, uncomplicated: J45.909

## 2013-01-12 MED ORDER — TECHNETIUM TC 99M MEBROFENIN IV KIT: 5.0000 | PACK | Freq: Once | INTRAVENOUS | Status: AC | PRN

## 2013-01-15 NOTE — Progress Notes (Signed)
Quick Note:  HIDA scan normal. Will see how she is doing at next follow-up. ______

## 2013-01-20 ENCOUNTER — Ambulatory Visit (INDEPENDENT_AMBULATORY_CARE_PROVIDER_SITE_OTHER): Payer: Medicare Other | Admitting: Gastroenterology

## 2013-01-20 ENCOUNTER — Encounter: Payer: Self-pay | Admitting: Gastroenterology

## 2013-01-20 VITALS — BP 128/72 | HR 78 | Temp 97.6°F | Ht 62.0 in | Wt 155.2 lb

## 2013-01-20 DIAGNOSIS — R1011 Right upper quadrant pain: Secondary | ICD-10-CM

## 2013-01-20 MED ORDER — LUBIPROSTONE 24 MCG PO CAPS: 24.0000 ug | ORAL_CAPSULE | Freq: Two times a day (BID) | ORAL | Status: DC

## 2013-01-20 NOTE — Patient Instructions (Addendum)
I have provided samples of a higher dosage of Amitiza. This is . Take 1 capsule with food each evening for 3 days, then increase to twice a day if needed. I have sent in a prescription for you if this works well.   Review the reflux diet. Call us in 2 weeks to let us know how you are doing. Try to keep a record of when the discomfort occurs (related to eating, movement, associated with anything) so we can know the best way to investigate this further.

## 2013-01-20 NOTE — Progress Notes (Signed)
Referring Provider: Elfredia Nevins, MD Primary Care Physician:  Cassell Smiles., MD Primary GI: Dr. Jena Gauss  Chief Complaint  Patient presents with  . Follow-up    HPI:   [name] [name] is a pleasant 72 year old female with a history of GERD and colon cancer, s/p LAR in 1999. Her surveillance colonoscopy is up-to-date and due again in 2019. Last seen end of Aug 2014 by myself due to intermittent RUQ pain. Occasional constipation reported, skipping some days. Started on Linzess 145 mcg for constipation, but insurance denied this as first option. Sent in Amitiza BID. [name] of abdomen normal. HFP normal. Subsequent HIDA scan with normal EF of 97%, no symptoms with Ensure ingestion.  Had BM every day with Amitiza but still doesn't feel adequate. Some small balls. Has had 3 spells of RUQ discomfort but not a bad spell yet. Just hits her. Ate onions then noticed a flare, then ate again and was fine. No associated nausea, vomiting. Occasional indigestion but doesn't take Prilosec daily.   Past Medical History  Diagnosis Date  . Schatzki's ring   . Hematuria   . Hemorrhoids   . GERD (gastroesophageal reflux disease)   . DM (dermatomyositis)   . Hypercholesterolemia   . Diverticulosis   . Esophageal dysphagia   . Colon cancer     status post low anterior resection, limited stage disease requiring no adjuvant therapy  . Hiatal hernia   . Colon adenoma   . Asthma     Past Surgical History  Procedure Laterality Date  . Colonoscopy  11/09    Dr. Jena Gauss- external hemorrhoidal tags o/w normal rectal mucosa, s/p surgical resection with a normal appearing anastomosis 12cm, pan colonic diverticulum  . Ingunal hernia repair    . Tubal ligation    . Low anterior bowel resection      NO ADJ CHEMO  . Appendectomy    . Vein ligation and stripping    . Esophagogastroduodenoscopy  07/2007    Dr. Carron Curie cervical esophageal web, schatzki ring, large hiatal hernia  . Colonoscopy N/A  06/02/2012    ZOX:WRUEAV post low anterior resection. Pancolonic diverticulosis. Colonic polyp-tubular adenoma. Surveillance due 2019.     Current Outpatient Prescriptions  Medication Sig Dispense Refill  . acetaminophen (TYLENOL) 325 MG tablet Take 650 mg by mouth every 6 (six) hours as needed for pain.      Marland Kitchen albuterol (PROVENTIL HFA;VENTOLIN HFA) 108 (90 BASE) MCG/ACT inhaler Inhale 2 puffs into the lungs every 6 (six) hours as needed for wheezing.      Marland Kitchen albuterol (PROVENTIL, VENTOLIN) (5 MG/ML) 0.5% NEBU Take 5 mg/hr by nebulization as needed.        Marland Kitchen alendronate (FOSAMAX) 35 MG tablet Take 35 mg by mouth every 7 (seven) days.       . ALPRAZolam (XANAX) 0.5 MG tablet Take 0.5 mg by mouth at bedtime as needed. Anxiety/sleep.      . calcium carbonate (TUMS - DOSED IN MG ELEMENTAL CALCIUM) 500 MG chewable tablet Chew 1 tablet by mouth daily.        . cetirizine (ZYRTEC) 5 MG tablet Take 5 mg by mouth daily.      Marland Kitchen ezetimibe-simvastatin (VYTORIN) 10-20 MG per tablet Take 1 tablet by mouth at bedtime.        Marland Kitchen lubiprostone (AMITIZA) 8 MCG capsule Take 1 capsule (8 mcg total) by mouth 2 (two) times daily with a meal.  60 capsule  3  . Multiple Vitamin (MULTIVITAMIN) tablet Take  1 tablet by mouth daily.        Marland Kitchen omeprazole (PRILOSEC) 20 MG capsule Take 20 mg by mouth daily.        . Linaclotide (LINZESS) 145 MCG CAPS capsule Take 1 capsule (145 mcg total) by mouth daily. Take 30 minutes before breakfast daily.  30 capsule  3   No current facility-administered medications for this visit.    Allergies as of 01/20/2013 - Review Complete 01/20/2013  Allergen Reaction Noted  . Hydrocodone-acetaminophen  04/26/2010  . Iohexol  07/16/2008  . Nabumetone  04/26/2010    History   Social History  . Marital Status: Married    Spouse Name: N/A    Number of Children: N/A  . Years of Education: N/A   Social History Main Topics  . Smoking status: Never Smoker   . Smokeless tobacco: None  .  Alcohol Use: No  . Drug Use: No  . Sexual Activity: None   Other Topics Concern  . None   Social History Narrative  . None    Review of Systems: Negative unless mentioned in HPI.   Physical Exam: BP 128/72  Pulse 78  Temp(Src) 97.6 F (36.4 C) (Oral)  Ht 5\' 2"  (1.575 m)  Wt 155 lb 3.2 oz (70.398 kg)  BMI 28.38 kg/m2 General:   Alert and oriented. No distress noted. Pleasant and cooperative.  Head:  Normocephalic and atraumatic. Eyes:  Conjuctiva clear without scleral icterus. Mouth:  Oral mucosa pink and moist. Good dentition. No lesions. Heart:  S1, S2 present without murmurs, rubs, or gallops. Regular rate and rhythm. Abdomen:  +BS, soft, non-tender and non-distended. No rebound or guarding. Small lipoma-like area in left upper quadrant, non-fixed Msk:  Symmetrical without gross deformities. Normal posture. Extremities:  Without edema. Neurologic:  Alert and  oriented x4;  grossly normal neurologically. Skin:  Intact without significant lesions or rashes. Psych:  Alert and cooperative. Normal mood and affect.  Lab Results  Component Value Date   ALT 13 12/16/2012   AST 20 12/16/2012   ALKPHOS 90 12/16/2012   BILITOT 0.4 12/16/2012   NORMAL [name] OF ABD Sept 2014 HIDA NORMAL, no reproduction of pain with Ensure

## 2013-01-21 NOTE — Assessment & Plan Note (Signed)
Improvement overall, normal [name] and HIDA scan, HFP normal, abdominal exam benign. Question constipation playing a role, possible adhesive disease, doubt gastritis/PUD. Last EGD in 2009, but I do not feel this would shed much light as she has no concerning upper GI symptoms. Recommend increasing Amitiza to 24 mcg BID, keep diary of when pain occurs (if it occurs again), and contact us in 2 weeks with a progress report. Would recommend CT as next step.

## 2013-01-22 NOTE — Progress Notes (Signed)
Cc'd to PCP 

## 2013-02-04 ENCOUNTER — Telehealth: Payer: Self-pay

## 2013-02-04 NOTE — Telephone Encounter (Signed)
Per Gerrit Halls, NP I called pt to get PR. PT said she is feeling some better. Gerd diet helped some. She is only taking the Amitiza 24 mcg once a day. She said she has about 3-4 stools daily, not diarrhea, but very small amount at the time. Said she has not had any bad pain since she was here. Please advise! ( Routng to [facility] also, RMR pt).

## 2013-02-05 NOTE — Telephone Encounter (Signed)
Increase Amitiza to BID to see if that helps with better evacuation. If further pain, recommend CT.

## 2013-02-06 NOTE — Telephone Encounter (Signed)
Called. Many rings and no answer.  

## 2013-02-13 NOTE — Telephone Encounter (Signed)
Spoke with pt- she is taking the amitiza bid and not really noticing any difference. She is having 3-4 small bm's daily. no diarrhea. She is not having any more pain at this time.

## 2013-02-16 NOTE — Telephone Encounter (Signed)
As pain is improved, let's hold off on CT.   I recommend trial of Linzess 290 mcg daily. Can provide samples.  Offer non-urgent f/u in next 4 weeks.

## 2013-02-17 NOTE — Telephone Encounter (Signed)
Called and informed pt. Samples of Linzess 290 mcg at front for pick-up, to take one daily 30 min before breakfast. #12 given. She already has an appt in Nov.

## 2013-02-26 ENCOUNTER — Other Ambulatory Visit: Payer: Self-pay

## 2013-03-05 ENCOUNTER — Ambulatory Visit (INDEPENDENT_AMBULATORY_CARE_PROVIDER_SITE_OTHER): Payer: Medicare Other | Admitting: Gastroenterology

## 2013-03-05 ENCOUNTER — Encounter: Payer: Self-pay | Admitting: Gastroenterology

## 2013-03-05 VITALS — BP 101/65 | HR 90 | Temp 98.4°F | Wt 155.2 lb

## 2013-03-05 DIAGNOSIS — K5900 Constipation, unspecified: Secondary | ICD-10-CM

## 2013-03-05 DIAGNOSIS — R1011 Right upper quadrant pain: Secondary | ICD-10-CM

## 2013-03-05 DIAGNOSIS — D010 Carcinoma in situ of colon: Secondary | ICD-10-CM

## 2013-03-05 NOTE — Assessment & Plan Note (Signed)
Resolved. Question constipation playing a role. Resolution noted with regular BMs. [name] and HIDA on file, LFTs normal. Return in 1 year or sooner if needed.

## 2013-03-05 NOTE — Progress Notes (Signed)
Referring Provider: Elfredia Nevins, MD Primary Care Physician:  Cassell Smiles., MD Primary GI: Dr. Jena Gauss   Chief Complaint  Patient presents with  . Follow-up    HPI:   [name] [name] is a pleasant 72 year old female with a history of GERD and colon cancer, s/p LAR in 1999. Her surveillance colonoscopy is up-to-date and due again in 2019. Seen last in Sept 2014 with intermittent RUQ pain, normal [name] and HFP. HIDA normal with EF of 97% and no reproduction of symptoms. Last EGD in 2009. Amitiza increased to 24 mcg BID in September due to question of constipation or adhesive disease playing a role. Patient gave update via phone mid October stating pain has improved/resolved but constipation continued. Linzess provided.  States not taking Linzess or Amitiza now. Everything better. BM about every day, productive. Wt stable, no further RUQ pain. Prilosec prn.   Past Medical History  Diagnosis Date  . Schatzki's ring   . Hematuria   . Hemorrhoids   . GERD (gastroesophageal reflux disease)   . DM (dermatomyositis)   . Hypercholesterolemia   . Diverticulosis   . Esophageal dysphagia   . Colon cancer     status post low anterior resection, limited stage disease requiring no adjuvant therapy  . Hiatal hernia   . Colon adenoma   . Asthma     Past Surgical History  Procedure Laterality Date  . Colonoscopy  11/09    Dr. Jena Gauss- external hemorrhoidal tags o/w normal rectal mucosa, s/p surgical resection with a normal appearing anastomosis 12cm, pan colonic diverticulum  . Ingunal hernia repair    . Tubal ligation    . Low anterior bowel resection      NO ADJ CHEMO  . Appendectomy    . Vein ligation and stripping    . Esophagogastroduodenoscopy  07/2007    Dr. Carron Curie cervical esophageal web, schatzki ring, large hiatal hernia  . Colonoscopy N/A 06/02/2012    ZOX:WRUEAV post low anterior resection. Pancolonic diverticulosis. Colonic polyp-tubular adenoma. Surveillance due  2019.     Current Outpatient Prescriptions  Medication Sig Dispense Refill  . acetaminophen (TYLENOL) 325 MG tablet Take 650 mg by mouth every 6 (six) hours as needed for pain.      Marland Kitchen albuterol (PROVENTIL HFA;VENTOLIN HFA) 108 (90 BASE) MCG/ACT inhaler Inhale 2 puffs into the lungs every 6 (six) hours as needed for wheezing.      Marland Kitchen albuterol (PROVENTIL, VENTOLIN) (5 MG/ML) 0.5% NEBU Take 5 mg/hr by nebulization as needed.        Marland Kitchen alendronate (FOSAMAX) 35 MG tablet Take 35 mg by mouth every 7 (seven) days.       . ALPRAZolam (XANAX) 0.5 MG tablet Take 0.5 mg by mouth at bedtime as needed. Anxiety/sleep.      . calcium carbonate (TUMS - DOSED IN MG ELEMENTAL CALCIUM) 500 MG chewable tablet Chew 1 tablet by mouth daily.        . cetirizine (ZYRTEC) 5 MG tablet Take 5 mg by mouth daily.      Marland Kitchen ezetimibe-simvastatin (VYTORIN) 10-20 MG per tablet Take 1 tablet by mouth at bedtime.        . Linaclotide (LINZESS) 290 MCG CAPS capsule Take 290 mcg by mouth daily.      . Multiple Vitamin (MULTIVITAMIN) tablet Take 1 tablet by mouth daily.        Marland Kitchen omeprazole (PRILOSEC) 20 MG capsule Take 20 mg by mouth daily.  No current facility-administered medications for this visit.    Allergies as of 03/05/2013 - Review Complete 03/05/2013  Allergen Reaction Noted  . Hydrocodone-acetaminophen  04/26/2010  . Iohexol  07/16/2008  . Nabumetone  04/26/2010      History   Social History  . Marital Status: Married    Spouse Name: N/A    Number of Children: N/A  . Years of Education: N/A   Social History Main Topics  . Smoking status: Never Smoker   . Smokeless tobacco: None  . Alcohol Use: No  . Drug Use: No  . Sexual Activity: None   Other Topics Concern  . None   Social History Narrative  . None    Review of Systems: Negative unless mentioned in HPI.   Physical Exam: BP 101/65  Pulse 90  Temp(Src) 98.4 F (36.9 C) (Oral)  Wt 155 lb 3.2 oz (70.398 kg) General:   Alert and  oriented. No distress noted. Pleasant and cooperative.  Head:  Normocephalic and atraumatic. Eyes:  Conjuctiva clear without scleral icterus. Mouth:  Oral mucosa pink and moist. Good dentition. No lesions. Abdomen:  +BS, soft, non-tender and non-distended. No rebound or guarding. No HSM or masses noted. Msk:  Symmetrical without gross deformities. Normal posture. Extremities:  Without edema. Neurologic:  Alert and  oriented x4;  grossly normal neurologically. Skin:  Intact without significant lesions or rashes. Psych:  Alert and cooperative. Normal mood and affect.

## 2013-03-05 NOTE — Patient Instructions (Signed)
We will see you back in 1 year!!  Call us if anything changes at all, and we will see you sooner.

## 2013-03-05 NOTE — Assessment & Plan Note (Signed)
TCS up-to-date. Surveillance due 2019. Return in 1 year.

## 2013-03-05 NOTE — Progress Notes (Signed)
cc'd to pcp 

## 2013-03-05 NOTE — Assessment & Plan Note (Signed)
Resolved. Linzess if needed in future.

## 2013-04-18 ENCOUNTER — Encounter (HOSPITAL_COMMUNITY): Payer: Self-pay | Admitting: Emergency Medicine

## 2013-04-18 ENCOUNTER — Emergency Department (HOSPITAL_COMMUNITY)
Admission: EM | Admit: 2013-04-18 | Discharge: 2013-04-18 | Disposition: A | Discharge status: Home / Self Care | Payer: Medicare Other | Attending: Emergency Medicine | Admitting: Emergency Medicine

## 2013-04-18 ENCOUNTER — Emergency Department (HOSPITAL_COMMUNITY): Payer: Medicare Other

## 2013-04-18 DIAGNOSIS — R51 Headache: Secondary | ICD-10-CM | POA: Insufficient documentation

## 2013-04-18 DIAGNOSIS — J069 Acute upper respiratory infection, unspecified: Secondary | ICD-10-CM | POA: Insufficient documentation

## 2013-04-18 DIAGNOSIS — J45901 Unspecified asthma with (acute) exacerbation: Secondary | ICD-10-CM | POA: Insufficient documentation

## 2013-04-18 DIAGNOSIS — Z8739 Personal history of other diseases of the musculoskeletal system and connective tissue: Secondary | ICD-10-CM | POA: Insufficient documentation

## 2013-04-18 DIAGNOSIS — Z87448 Personal history of other diseases of urinary system: Secondary | ICD-10-CM | POA: Insufficient documentation

## 2013-04-18 DIAGNOSIS — Z87738 Personal history of other specified (corrected) congenital malformations of digestive system: Secondary | ICD-10-CM | POA: Insufficient documentation

## 2013-04-18 DIAGNOSIS — K219 Gastro-esophageal reflux disease without esophagitis: Secondary | ICD-10-CM | POA: Insufficient documentation

## 2013-04-18 DIAGNOSIS — Z85038 Personal history of other malignant neoplasm of large intestine: Secondary | ICD-10-CM | POA: Insufficient documentation

## 2013-04-18 DIAGNOSIS — E7800 Pure hypercholesterolemia, unspecified: Secondary | ICD-10-CM | POA: Insufficient documentation

## 2013-04-18 DIAGNOSIS — Z8679 Personal history of other diseases of the circulatory system: Secondary | ICD-10-CM | POA: Insufficient documentation

## 2013-04-18 DIAGNOSIS — Z79899 Other long term (current) drug therapy: Secondary | ICD-10-CM | POA: Insufficient documentation

## 2013-04-18 MED ORDER — ALBUTEROL SULFATE (5 MG/ML) 0.5% IN NEBU
5.0000 mg | INHALATION_SOLUTION | Freq: Once | RESPIRATORY_TRACT | Status: AC
  Administered 2013-04-18: 5 mg via RESPIRATORY_TRACT
  Filled 2013-04-18: qty 1

## 2013-04-18 MED ORDER — IPRATROPIUM BROMIDE 0.02 % IN SOLN
0.5000 mg | Freq: Once | RESPIRATORY_TRACT | Status: AC
  Administered 2013-04-18: 0.5 mg via RESPIRATORY_TRACT
  Filled 2013-04-18: qty 2.5

## 2013-04-18 MED ORDER — PREDNISONE 20 MG PO TABS: ORAL_TABLET | ORAL | Status: DC

## 2013-04-18 MED ORDER — ALBUTEROL SULFATE HFA 108 (90 BASE) MCG/ACT IN AERS
2.0000 | INHALATION_SPRAY | Freq: Once | RESPIRATORY_TRACT | Status: AC
  Administered 2013-04-18: 2 via RESPIRATORY_TRACT
  Filled 2013-04-18: qty 6.7

## 2013-04-18 NOTE — ED Notes (Signed)
MD aware patient wants to be d/c home.

## 2013-04-18 NOTE — ED Notes (Signed)
Pt c/o fever since this morning.  Reports productive cough with clear sputum for past few days.  C/O worsening SOB this morning.  Also c/o headache and upper back pain.

## 2013-04-18 NOTE — ED Provider Notes (Signed)
CSN: [id number]     Arrival date & time 04/18/13  4098 History  This chart was scribed for [name] Hutching, MD by Smiley Houseman, ED Scribe. The patient was seen in room APA07/APA07. Patient's care was started at 9:00 AM.    Chief Complaint  Patient presents with  . Shortness of Breath   The history is provided by the patient. No language interpreter was used.   HPI Comments: [name] [name] is a 72 y.o. female who presents to the Emergency Department complaining of constant worsening shortness of breath that started 1 day ago.  She complains of associated headache, productive cough, and fever.  ED temperature 98.45F.  She doesn't live alone and denies any sick contacts.  She states she is ambulatory.  She states she doesn't typically get bad chest colds, but the last time she did she was diagnosed with bronchitis.      Past Medical History  Diagnosis Date  . Schatzki's ring   . Hematuria   . Hemorrhoids   . GERD (gastroesophageal reflux disease)   . DM (dermatomyositis)   . Hypercholesterolemia   . Diverticulosis   . Esophageal dysphagia   . Colon cancer     status post low anterior resection, limited stage disease requiring no adjuvant therapy  . Hiatal hernia   . Colon adenoma   . Asthma    Past Surgical History  Procedure Laterality Date  . Colonoscopy  11/09    Dr. Jena Gauss- external hemorrhoidal tags o/w normal rectal mucosa, s/p surgical resection with a normal appearing anastomosis 12cm, pan colonic diverticulum  . Ingunal hernia repair    . Tubal ligation    . Low anterior bowel resection      NO ADJ CHEMO  . Appendectomy    . Vein ligation and stripping    . Esophagogastroduodenoscopy  07/2007    Dr. Carron Curie cervical esophageal web, schatzki ring, large hiatal hernia  . Colonoscopy N/A 06/02/2012    [id number] post low anterior resection. Pancolonic diverticulosis. Colonic polyp-tubular adenoma. Surveillance due 2019.    No family history on file. History   Substance Use Topics  . Smoking status: Never Smoker   . Smokeless tobacco: Not on file  . Alcohol Use: No   OB History   Grav Para Term Preterm Abortions TAB SAB Ect Mult Living                 Review of Systems A complete 10 system review of systems was obtained and all systems are negative except as noted in the HPI and PMH.    Allergies  Hydrocodone-acetaminophen; Iohexol; and Nabumetone  Home Medications   Current Outpatient Rx  Name  Route  Sig  Dispense  Refill  . acetaminophen (TYLENOL) 325 MG tablet   Oral   Take 650 mg by mouth every 6 (six) hours as needed for pain.         Marland Kitchen albuterol (PROVENTIL HFA;VENTOLIN HFA) 108 (90 BASE) MCG/ACT inhaler   Inhalation   Inhale 2 puffs into the lungs every 6 (six) hours as needed for wheezing.         Marland Kitchen alendronate (FOSAMAX) 35 MG tablet   Oral   Take 35 mg by mouth every 7 (seven) days. Takes on Friday         . ALPRAZolam (XANAX) 0.5 MG tablet   Oral   Take 0.5 mg by mouth at bedtime as needed. Anxiety/sleep.         . calcium  carbonate (OS-CAL) 600 MG TABS tablet   Oral   Take 600 mg by mouth 2 (two) times daily with a meal.         . cetirizine (ZYRTEC) 10 MG tablet   Oral   Take 10 mg by mouth daily.         . Multiple Vitamin (MULTIVITAMIN) tablet   Oral   Take 1 tablet by mouth daily.           Marland Kitchen omeprazole (PRILOSEC) 20 MG capsule   Oral   Take 20 mg by mouth daily.           Marland Kitchen oxymetazoline (AFRIN) 0.05 % nasal spray   Each Nare   Place 1 spray into both nostrils 2 (two) times daily as needed for congestion.         . simvastatin (ZOCOR) 40 MG tablet   Oral   Take 40 mg by mouth daily.         . predniSONE (DELTASONE) 20 MG tablet      3 tabs po day one, then 2 po daily x 4 days   11 tablet   0    Triage Vitals; BP 143/64  Pulse 95  Temp(Src) 98.7 F (37.1 C) (Oral)  Resp 24  Ht 5\' 2"  (1.575 m)  Wt 155 lb (70.308 kg)  BMI 28.34 kg/m2  SpO2 95% Physical Exam   Nursing note and vitals reviewed. Constitutional: She is oriented to person, place, and time. She appears well-developed and well-nourished.  Slight dyspnea.  HENT:  Head: Normocephalic and atraumatic.  Eyes: Conjunctivae and EOM are normal. Pupils are equal, round, and reactive to light.  Neck: Normal range of motion. Neck supple.  Cardiovascular: Normal rate, regular rhythm and normal heart sounds.   Pulmonary/Chest: Breath sounds normal. Tachypnea (slight) noted. She has no wheezes.  Abdominal: Soft. Bowel sounds are normal.  Musculoskeletal: Normal range of motion.  Neurological: She is alert and oriented to person, place, and time.  Skin: Skin is warm and dry. No pallor.  Psychiatric: She has a normal mood and affect. Her behavior is normal.    ED Course  Procedures (including critical care time)\ DIAGNOSTIC STUDIES: Oxygen Saturation is 95% on RA, adequate by my interpretation.    COORDINATION OF CARE: 9:03 AM-Will order breathing treatment and chest X-ray. Patient informed of current plan of treatment and evaluation and agrees with plan.    Results for orders placed in visit on 12/16/12  HEPATIC FUNCTION PANEL      Result Value Range   Total Bilirubin 0.4  0.3 - 1.2 mg/dL   Bilirubin, Direct 0.1  0.0 - 0.3 mg/dL   Indirect Bilirubin 0.3  0.0 - 0.9 mg/dL   Alkaline Phosphatase 90  39 - 117 U/L   AST 20  0 - 37 U/L   ALT 13  0 - 35 U/L   Total Protein 6.9  6.0 - 8.3 g/dL   Albumin 4.4  3.5 - 5.2 g/dL  URINALYSIS W MICROSCOPIC + REFLEX CULTURE      Result Value Range   Color, Urine YELLOW  YELLOW   APPearance TURBID (*) CLEAR   Specific Gravity, Urine 1.013  1.005 - 1.030   pH 7.5  5.0 - 8.0   Glucose, UA NEG  NEG mg/dL   Bilirubin Urine NEG  NEG   Ketones, ur NEG  NEG mg/dL   Hgb urine dipstick NEG  NEG   Protein, ur NEG  NEG mg/dL  Urobilinogen, UA 0.2  0.0 - 1.0 mg/dL   Nitrite NEG  NEG   Leukocytes, UA NEG  NEG   Squamous Epithelial / LPF NONE SEEN  RARE    Crystals NONE SEEN  NONE SEEN   Casts NONE SEEN  NONE SEEN   WBC, UA 0-2  <3 WBC/hpf   RBC / HPF 0-2  <3 RBC/hpf   Bacteria, UA NONE SEEN  RARE   Dg Chest 2 View  04/18/2013   CLINICAL DATA:  Shortness of breath.  EXAM: CHEST  2 VIEW  COMPARISON:  06/29/2010  FINDINGS: The cardiac silhouette is within normal limits for size. The thoracic aorta is mildly tortuous. Moderate-sized hiatal hernia remains. The lungs are slightly less well inflated than on the prior study with mild opacity in the medial right lung base. There is no evidence of focal airspace consolidation elsewhere or edema, pleural effusion, or pneumothorax. No acute osseous abnormality is identified.  IMPRESSION: Mildly shallower lung inflation with small amount of right basilar opacity, likely atelectasis. Unchanged hiatal hernia.   Electronically Signed   By: Sebastian Ache   On: 04/18/2013 09:56     EKG Interpretation    Date/Time:  Saturday April 18 2013 08:44:13 EST Ventricular Rate:  87 PR Interval:  118 QRS Duration: 86 QT Interval:  364 QTC Calculation: 438 R Axis:   61 Text Interpretation:  Normal sinus rhythm Nonspecific ST and T wave abnormality Abnormal ECG No previous ECGs available Confirmed by Tonji Elliff  MD, Khaled Herda ([id number]) on 04/18/2013 9:13:08 AM            MDM   1. URI (upper respiratory infection)    Chest x-ray shows no pneumonia. Patient is hemodynamically stable. Suspect viral etiology. Discharge medications prednisone for 4 days  I personally performed the services described in this documentation, which was scribed in my presence. The recorded information has been reviewed and is accurate.    [name] Hutching, MD 04/21/13 2049

## 2013-04-18 NOTE — ED Notes (Signed)
Noted to ambulate to room with steady gait. No distress.

## 2013-04-18 NOTE — ED Notes (Signed)
Awaiting RT for inhaler teaching prior to discharge.

## 2013-04-18 NOTE — ED Notes (Signed)
Patient with no complaints at this time. Respirations even and unlabored. Skin warm/dry. Discharge instructions reviewed with patient at this time. Patient given opportunity to voice concerns/ask questions. Patient discharged at this time and left Emergency Department with steady gait.   

## 2013-04-18 NOTE — ED Notes (Signed)
Patient states that she feels better since she had the breathing treatment. Patient denies feeling short of breath at this time. Will continue to monitor patient.

## 2014-02-03 ENCOUNTER — Encounter: Payer: Self-pay | Admitting: Internal Medicine

## 2015-04-24 HISTORY — PX: CATARACT EXTRACTION: SUR2

## 2015-05-03 DIAGNOSIS — I517 Cardiomegaly: Secondary | ICD-10-CM | POA: Diagnosis not present

## 2015-05-03 DIAGNOSIS — I4891 Unspecified atrial fibrillation: Secondary | ICD-10-CM | POA: Diagnosis not present

## 2015-05-03 DIAGNOSIS — I482 Chronic atrial fibrillation: Secondary | ICD-10-CM | POA: Diagnosis not present

## 2015-05-03 DIAGNOSIS — I340 Nonrheumatic mitral (valve) insufficiency: Secondary | ICD-10-CM | POA: Diagnosis not present

## 2015-05-10 DIAGNOSIS — M810 Age-related osteoporosis without current pathological fracture: Secondary | ICD-10-CM | POA: Diagnosis not present

## 2015-05-10 DIAGNOSIS — R21 Rash and other nonspecific skin eruption: Secondary | ICD-10-CM | POA: Diagnosis not present

## 2015-05-10 DIAGNOSIS — Z789 Other specified health status: Secondary | ICD-10-CM | POA: Diagnosis not present

## 2015-05-10 DIAGNOSIS — Z6830 Body mass index (BMI) 30.0-30.9, adult: Secondary | ICD-10-CM | POA: Diagnosis not present

## 2015-05-10 DIAGNOSIS — E785 Hyperlipidemia, unspecified: Secondary | ICD-10-CM | POA: Diagnosis not present

## 2015-05-10 DIAGNOSIS — I4891 Unspecified atrial fibrillation: Secondary | ICD-10-CM | POA: Diagnosis not present

## 2015-06-09 DIAGNOSIS — K5792 Diverticulitis of intestine, part unspecified, without perforation or abscess without bleeding: Secondary | ICD-10-CM | POA: Diagnosis not present

## 2015-06-09 DIAGNOSIS — R1032 Left lower quadrant pain: Secondary | ICD-10-CM | POA: Diagnosis not present

## 2015-06-09 DIAGNOSIS — R5383 Other fatigue: Secondary | ICD-10-CM | POA: Diagnosis not present

## 2015-06-09 DIAGNOSIS — I4891 Unspecified atrial fibrillation: Secondary | ICD-10-CM | POA: Diagnosis not present

## 2015-06-22 DIAGNOSIS — M7989 Other specified soft tissue disorders: Secondary | ICD-10-CM | POA: Diagnosis not present

## 2015-06-22 DIAGNOSIS — Z7901 Long term (current) use of anticoagulants: Secondary | ICD-10-CM | POA: Diagnosis not present

## 2015-06-22 DIAGNOSIS — M79605 Pain in left leg: Secondary | ICD-10-CM | POA: Diagnosis not present

## 2015-06-22 DIAGNOSIS — Z86718 Personal history of other venous thrombosis and embolism: Secondary | ICD-10-CM | POA: Diagnosis not present

## 2015-06-22 DIAGNOSIS — I8393 Asymptomatic varicose veins of bilateral lower extremities: Secondary | ICD-10-CM | POA: Diagnosis not present

## 2015-06-22 DIAGNOSIS — Z299 Encounter for prophylactic measures, unspecified: Secondary | ICD-10-CM | POA: Diagnosis not present

## 2015-06-23 DIAGNOSIS — M79605 Pain in left leg: Secondary | ICD-10-CM | POA: Diagnosis not present

## 2015-06-23 DIAGNOSIS — I8393 Asymptomatic varicose veins of bilateral lower extremities: Secondary | ICD-10-CM | POA: Diagnosis not present

## 2015-07-07 DIAGNOSIS — R609 Edema, unspecified: Secondary | ICD-10-CM | POA: Diagnosis not present

## 2015-07-07 DIAGNOSIS — J449 Chronic obstructive pulmonary disease, unspecified: Secondary | ICD-10-CM | POA: Diagnosis not present

## 2015-07-07 DIAGNOSIS — Z299 Encounter for prophylactic measures, unspecified: Secondary | ICD-10-CM | POA: Diagnosis not present

## 2015-07-07 DIAGNOSIS — Z789 Other specified health status: Secondary | ICD-10-CM | POA: Diagnosis not present

## 2015-08-01 DIAGNOSIS — H6982 Other specified disorders of Eustachian tube, left ear: Secondary | ICD-10-CM | POA: Diagnosis not present

## 2015-08-01 DIAGNOSIS — J449 Chronic obstructive pulmonary disease, unspecified: Secondary | ICD-10-CM | POA: Diagnosis not present

## 2015-08-01 DIAGNOSIS — F411 Generalized anxiety disorder: Secondary | ICD-10-CM | POA: Diagnosis not present

## 2015-08-01 DIAGNOSIS — I4891 Unspecified atrial fibrillation: Secondary | ICD-10-CM | POA: Diagnosis not present

## 2015-08-11 DIAGNOSIS — R159 Full incontinence of feces: Secondary | ICD-10-CM | POA: Diagnosis not present

## 2015-08-11 DIAGNOSIS — J449 Chronic obstructive pulmonary disease, unspecified: Secondary | ICD-10-CM | POA: Diagnosis not present

## 2015-08-11 DIAGNOSIS — E7800 Pure hypercholesterolemia, unspecified: Secondary | ICD-10-CM | POA: Diagnosis not present

## 2015-08-11 DIAGNOSIS — I4891 Unspecified atrial fibrillation: Secondary | ICD-10-CM | POA: Diagnosis not present

## 2015-08-11 DIAGNOSIS — K625 Hemorrhage of anus and rectum: Secondary | ICD-10-CM | POA: Diagnosis not present

## 2015-08-11 DIAGNOSIS — F411 Generalized anxiety disorder: Secondary | ICD-10-CM | POA: Diagnosis not present

## 2015-08-11 DIAGNOSIS — Z299 Encounter for prophylactic measures, unspecified: Secondary | ICD-10-CM | POA: Diagnosis not present

## 2015-08-11 DIAGNOSIS — M159 Polyosteoarthritis, unspecified: Secondary | ICD-10-CM | POA: Diagnosis not present

## 2015-08-12 ENCOUNTER — Encounter: Payer: Self-pay | Admitting: Internal Medicine

## 2015-08-30 ENCOUNTER — Ambulatory Visit: Payer: BLUE CROSS/BLUE SHIELD | Admitting: Gastroenterology

## 2015-08-30 DIAGNOSIS — M159 Polyosteoarthritis, unspecified: Secondary | ICD-10-CM | POA: Diagnosis not present

## 2015-08-30 DIAGNOSIS — I4891 Unspecified atrial fibrillation: Secondary | ICD-10-CM | POA: Diagnosis not present

## 2015-08-30 DIAGNOSIS — E7800 Pure hypercholesterolemia, unspecified: Secondary | ICD-10-CM | POA: Diagnosis not present

## 2015-08-30 DIAGNOSIS — J449 Chronic obstructive pulmonary disease, unspecified: Secondary | ICD-10-CM | POA: Diagnosis not present

## 2015-09-09 DIAGNOSIS — Z789 Other specified health status: Secondary | ICD-10-CM | POA: Diagnosis not present

## 2015-09-09 DIAGNOSIS — J441 Chronic obstructive pulmonary disease with (acute) exacerbation: Secondary | ICD-10-CM | POA: Diagnosis not present

## 2015-09-09 DIAGNOSIS — E785 Hyperlipidemia, unspecified: Secondary | ICD-10-CM | POA: Diagnosis not present

## 2015-09-09 DIAGNOSIS — I4891 Unspecified atrial fibrillation: Secondary | ICD-10-CM | POA: Diagnosis not present

## 2015-09-09 DIAGNOSIS — J069 Acute upper respiratory infection, unspecified: Secondary | ICD-10-CM | POA: Diagnosis not present

## 2015-09-15 ENCOUNTER — Telehealth: Payer: Self-pay

## 2015-09-15 ENCOUNTER — Ambulatory Visit (INDEPENDENT_AMBULATORY_CARE_PROVIDER_SITE_OTHER): Payer: Medicare Other | Admitting: Nurse Practitioner

## 2015-09-15 ENCOUNTER — Encounter: Payer: Self-pay | Admitting: Nurse Practitioner

## 2015-09-15 VITALS — BP 120/74 | HR 90 | Temp 98.1°F | Ht 62.0 in | Wt 155.0 lb

## 2015-09-15 DIAGNOSIS — K625 Hemorrhage of anus and rectum: Secondary | ICD-10-CM | POA: Insufficient documentation

## 2015-09-15 DIAGNOSIS — Z85038 Personal history of other malignant neoplasm of large intestine: Secondary | ICD-10-CM | POA: Diagnosis not present

## 2015-09-15 DIAGNOSIS — R109 Unspecified abdominal pain: Secondary | ICD-10-CM | POA: Insufficient documentation

## 2015-09-15 DIAGNOSIS — R1030 Lower abdominal pain, unspecified: Secondary | ICD-10-CM

## 2015-09-15 NOTE — Patient Instructions (Signed)
1. We will schedule a tentative date for your colonoscopy for you. 2. We will contact your primary care to ensure it is okay with them if we hold her Xarelto for 48 hours prior to your colonoscopy. 3. Return for follow-up in 3 months or based on postprocedure recommendations.

## 2015-09-15 NOTE — Assessment & Plan Note (Addendum)
Patient with onset of rectal bleeding in the past 3 months with a history of external hemorrhoids. No rectal pain or irritation noted, is on Xarelto as of the past year. Bleeding is increasing in frequency. Has a personal history of colon cancer as noted below. Is due for a repeat colonoscopy in about a year and a half. Likely benign anorectal source given her hemorrhoid history however cannot exclude polyp, recurrent colon cancer, or other more insidious process. At this point we will proceed with a colonoscopy to further evaluate. Return for follow-up in 3 months.  Proceed with TCS with 12.5 mg preprocedure Phenergan with Dr. Gala Romney in near future: the risks, benefits, and alternatives have been discussed with the patient in detail. The patient states understanding and desires to proceed.  The patient is on Xarelto and we will contact primary care for the okay to hold this 48 hours prior to procedure. She is also on occasional Xanax and daily Paxil. We will provide 12.5 mg preprocedure Phenergan to promote adequate sedation.

## 2015-09-15 NOTE — Progress Notes (Signed)
cc'ed to pcp °

## 2015-09-15 NOTE — Telephone Encounter (Signed)
I have faxed a request to Dr. Woody Seller about holding Xarelto for 48 hours prior to a colonoscopy.

## 2015-09-15 NOTE — Assessment & Plan Note (Signed)
Patient has a personal history of colon cancer status post low anterior resection in 1999. Last colonoscopy about 3 and half years ago with a single tubular adenoma polyp in the hepatic flexure. Given her symptoms above including abdominal pain, rectal bleeding which is increasing in frequency and intensity we will proceed with colonoscopy as noted above. Return for follow-up in 3 months.

## 2015-09-15 NOTE — Telephone Encounter (Signed)
Noted  

## 2015-09-15 NOTE — Telephone Encounter (Signed)
HOLDING DATE ON 10/06/15

## 2015-09-15 NOTE — Progress Notes (Signed)
Referring Provider: Redmond School, MD Primary Care Physician:  Glenda Chroman, MD Primary GI:  Dr. Gala Romney  Chief Complaint  Patient presents with  . Rectal Bleeding    HPI:   [name] [name] is a 75 y.o. female who presents on referral from primary care for rectal bleeding and fecal incontinence. She was last seen by our office on 03/05/2013 for constipation, right upper quadrant pain, carcinoma in situ of colon. She had a lower anterior resection in 2009. At her last office visit was noted her colonoscopy is up-to-date and do again in 2019. Constipation on Linzess and Amitiza, symptoms improved and stopped taking all medications without recurrence of constipation. Right upper quadrant pain had resolved at that time with ultrasound and HIDA scan both on file, and LFTs normal. Recommended return for follow-up in one year which did not occur.  Last colonoscopy 2014 noted pancolonic diverticulosis and a single polyp in the hepatic flexure which was resected and found to be tubular adenoma on surgical pathology. Recommended 5 year repeat colonoscopy.  I saw primary care and 08/11/2015 at which point she was complaining of lower abdominal pain with a sudden onset and persistent pattern, mild. Also associated with bloating, bloody stools, diarrhea. Noted tenderness left lower quadrant and epigastric area. CBC, CMP ordered and not included with office notes. Referral to GI.  Today she states she was started on a blood thinner last year. Bleeding started about 4 months ago, initially as a light orange, then to streaks of blood on the stool. Stool was checked by PCP and was heme+. Also with lower abdominal pain. No significant change in bowel habits associated. Stools typically soft and pass easily, sometimes a little bit harder stools. Last couple days amount of blood is increasing as is frequency. Denies fever, chills, vomiting, unintentional weight loss. Has had some nausea over the past couple  days. Also intermittent increased fluctuance. GERD symptoms doing well. Denies chest pain, dyspnea, lightheadedness, syncope, near syncope. Denies any other upper or lower GI symptoms.   Past Medical History  Diagnosis Date  . Schatzki's ring   . Hematuria   . Hemorrhoids   . GERD (gastroesophageal reflux disease)   . DM (dermatomyositis)   . Hypercholesterolemia   . Diverticulosis   . Esophageal dysphagia   . Colon cancer ([place])     status post low anterior resection, limited stage disease requiring no adjuvant therapy  . Hiatal hernia   . Colon adenoma   . Asthma     Past Surgical History  Procedure Laterality Date  . Colonoscopy  11/09    Dr. Gala Romney- external hemorrhoidal tags o/w normal rectal mucosa, s/p surgical resection with a normal appearing anastomosis 12cm, pan colonic diverticulum  . Ingunal hernia repair    . Tubal ligation    . Low anterior bowel resection      NO ADJ CHEMO  . Appendectomy    . Vein ligation and stripping    . Esophagogastroduodenoscopy  07/2007    Dr. Daiva Nakayama cervical esophageal web, schatzki ring, large hiatal hernia  . Colonoscopy N/A 06/02/2012    [id number] post low anterior resection. Pancolonic diverticulosis. Colonic polyp-tubular adenoma. Surveillance due 2019.     Current Outpatient Prescriptions  Medication Sig Dispense Refill  . acetaminophen (TYLENOL) 325 MG tablet Take 650 mg by mouth every 6 (six) hours as needed for pain.    Marland Kitchen albuterol (PROVENTIL HFA;VENTOLIN HFA) 108 (90 BASE) MCG/ACT inhaler Inhale 2 puffs into the lungs every  6 (six) hours as needed for wheezing.    Marland Kitchen ALPRAZolam (XANAX) 0.5 MG tablet Take 0.5 mg by mouth 2 (two) times daily as needed. Anxiety/sleep.    Marland Kitchen amoxicillin (AMOXIL) 500 MG tablet Take 500 mg by mouth 3 (three) times daily before meals.    . calcium carbonate (OS-CAL) 600 MG TABS tablet Take 600 mg by mouth 2 (two) times daily with a meal.    . Multiple Vitamin (MULTIVITAMIN) tablet Take 1  tablet by mouth daily.      Marland Kitchen PARoxetine (PAXIL) 20 MG tablet Take 20 mg by mouth daily. Takes 1/2 tablet daily    . rivaroxaban (XARELTO) 20 MG TABS tablet Take 20 mg by mouth daily with supper.    . simvastatin (ZOCOR) 40 MG tablet Take 40 mg by mouth daily.     No current facility-administered medications for this visit.    Allergies as of 09/15/2015 - Review Complete 09/15/2015  Allergen Reaction Noted  . Hydrocodone-acetaminophen Hives 04/26/2010  . Iohexol  07/16/2008  . Nabumetone Rash 04/26/2010    Family History  Problem Relation Age of Onset  . Colon cancer Neg Hx     Social History   Social History  . Marital Status: Married    Spouse Name: N/A  . Number of Children: N/A  . Years of Education: N/A   Social History Main Topics  . Smoking status: Never Smoker   . Smokeless tobacco: Never Used  . Alcohol Use: 0.0 oz/week    0 Standard drinks or equivalent per week     Comment: Rarely  . Drug Use: No  . Sexual Activity: Not Asked   Other Topics Concern  . None   Social History Narrative    Review of Systems: General: Negative for anorexia, weight loss, fever, chills, fatigue, weakness. ENT: Negative for hoarseness, difficulty swallowing , nasal congestion. CV: Negative for chest pain, angina, palpitations, dyspnea on exertion, peripheral edema.  Respiratory: Negative for dyspnea at rest, dyspnea on exertion.  GI: See history of present illness. Endo: Negative for unusual weight change.  Heme: Negative for bruising or bleeding.   Physical Exam: BP 120/74 mmHg  Pulse 90  Temp(Src) 98.1 F (36.7 C) (Oral)  Ht 5\' 2"  (1.575 m)  Wt 155 lb (70.308 kg)  BMI 28.34 kg/m2 General:   Alert and oriented. Pleasant and cooperative. Well-nourished and well-developed.  Head:  Normocephalic and atraumatic. Eyes:  Without icterus, sclera clear and conjunctiva pink.  Ears:  Normal auditory acuity. Cardiovascular:  S1, S2 present with systolic murmur appreciated.  Extremities without clubbing or edema. Respiratory:  Clear to auscultation bilaterally. No  rales or rhonchi. Minimal end expiratory wheezes consistent with history of asthma with recent URI. No distress.  Gastrointestinal:  +BS, soft, and non-distended. Mild LLQ TTP. No HSM noted. No guarding or rebound. No masses appreciated.  Rectal:  Deferred  Musculoskalatal:  Symmetrical without gross deformities. Neurologic:  Alert and oriented x4;  grossly normal neurologically. Psych:  Alert and cooperative. Normal mood and affect. Heme/Lymph/Immune: No excessive bruising noted.    09/15/2015 9:05 AM   Disclaimer: This note was dictated with voice recognition software. Similar sounding words can inadvertently be transcribed and may not be corrected upon review.

## 2015-09-15 NOTE — Assessment & Plan Note (Signed)
Patient describes mild, intermittent, but persistent lower abdominal pain. No associated change in bowel habits. Does have increasing frequency of rectal bleeding over the past several months. She has recently been started on Xarelto within the past year. We'll proceed with colonoscopy as noted above to further evaluate given her high risk with personal history of colon cancer.

## 2015-09-21 NOTE — Telephone Encounter (Signed)
Re-faxed the request to Dr. Woody Seller.

## 2015-09-22 ENCOUNTER — Telehealth: Payer: Self-pay | Admitting: Internal Medicine

## 2015-09-22 NOTE — Telephone Encounter (Signed)
Pt called this morning saying she was seen on 5/25 and was wanting to schedule her colonoscopy. Please call her at [phone]

## 2015-09-26 NOTE — Telephone Encounter (Signed)
Called pt  And was unable leave message

## 2015-09-27 ENCOUNTER — Other Ambulatory Visit: Payer: Self-pay

## 2015-09-27 DIAGNOSIS — Z85038 Personal history of other malignant neoplasm of large intestine: Secondary | ICD-10-CM

## 2015-09-27 DIAGNOSIS — R109 Unspecified abdominal pain: Secondary | ICD-10-CM

## 2015-09-27 DIAGNOSIS — K625 Hemorrhage of anus and rectum: Secondary | ICD-10-CM

## 2015-09-27 MED ORDER — PEG 3350-KCL-NA BICARB-NACL 420 G PO SOLR: 4000.0000 mL | Freq: Once | ORAL | Status: DC

## 2015-09-27 NOTE — Telephone Encounter (Signed)
Just received the fax back from Dr. Woody Seller that it is OK to hold Xarelto for 48 hours prior to procedure.

## 2015-09-27 NOTE — Telephone Encounter (Signed)
Pt is aware. Instructions mailed

## 2015-09-27 NOTE — Telephone Encounter (Signed)
Pt called office back and is set for TCS on 06/22

## 2015-09-27 NOTE — Telephone Encounter (Signed)
Noted, thanks!

## 2015-10-03 DIAGNOSIS — E7800 Pure hypercholesterolemia, unspecified: Secondary | ICD-10-CM | POA: Diagnosis not present

## 2015-10-03 DIAGNOSIS — I4891 Unspecified atrial fibrillation: Secondary | ICD-10-CM | POA: Diagnosis not present

## 2015-10-03 DIAGNOSIS — J449 Chronic obstructive pulmonary disease, unspecified: Secondary | ICD-10-CM | POA: Diagnosis not present

## 2015-10-03 DIAGNOSIS — M159 Polyosteoarthritis, unspecified: Secondary | ICD-10-CM | POA: Diagnosis not present

## 2015-10-04 DIAGNOSIS — L570 Actinic keratosis: Secondary | ICD-10-CM | POA: Diagnosis not present

## 2015-10-10 DIAGNOSIS — I340 Nonrheumatic mitral (valve) insufficiency: Secondary | ICD-10-CM | POA: Diagnosis not present

## 2015-10-10 DIAGNOSIS — I480 Paroxysmal atrial fibrillation: Secondary | ICD-10-CM | POA: Diagnosis not present

## 2015-10-11 ENCOUNTER — Other Ambulatory Visit: Payer: Self-pay

## 2015-10-11 DIAGNOSIS — Z85038 Personal history of other malignant neoplasm of large intestine: Secondary | ICD-10-CM

## 2015-10-13 ENCOUNTER — Encounter (HOSPITAL_COMMUNITY): Payer: Self-pay | Admitting: *Deleted

## 2015-10-13 ENCOUNTER — Ambulatory Visit (HOSPITAL_COMMUNITY)
Admission: RE | Admit: 2015-10-13 | Discharge: 2015-10-13 | Disposition: A | Discharge status: Home / Self Care | Payer: Medicare Other | Source: Ambulatory Visit | Attending: Internal Medicine | Admitting: Internal Medicine

## 2015-10-13 ENCOUNTER — Encounter (HOSPITAL_COMMUNITY)
Admission: RE | Disposition: A | Discharge status: Home / Self Care | Payer: Self-pay | Source: Ambulatory Visit | Attending: Internal Medicine

## 2015-10-13 DIAGNOSIS — R197 Diarrhea, unspecified: Secondary | ICD-10-CM

## 2015-10-13 DIAGNOSIS — Z9049 Acquired absence of other specified parts of digestive tract: Secondary | ICD-10-CM | POA: Insufficient documentation

## 2015-10-13 DIAGNOSIS — Z86010 Personal history of colonic polyps: Secondary | ICD-10-CM | POA: Insufficient documentation

## 2015-10-13 DIAGNOSIS — Z79899 Other long term (current) drug therapy: Secondary | ICD-10-CM | POA: Diagnosis not present

## 2015-10-13 DIAGNOSIS — E7800 Pure hypercholesterolemia, unspecified: Secondary | ICD-10-CM | POA: Diagnosis not present

## 2015-10-13 DIAGNOSIS — K5730 Diverticulosis of large intestine without perforation or abscess without bleeding: Secondary | ICD-10-CM | POA: Insufficient documentation

## 2015-10-13 DIAGNOSIS — K921 Melena: Secondary | ICD-10-CM | POA: Diagnosis not present

## 2015-10-13 DIAGNOSIS — K219 Gastro-esophageal reflux disease without esophagitis: Secondary | ICD-10-CM | POA: Insufficient documentation

## 2015-10-13 DIAGNOSIS — Z7901 Long term (current) use of anticoagulants: Secondary | ICD-10-CM | POA: Insufficient documentation

## 2015-10-13 DIAGNOSIS — K625 Hemorrhage of anus and rectum: Secondary | ICD-10-CM

## 2015-10-13 DIAGNOSIS — Z85038 Personal history of other malignant neoplasm of large intestine: Secondary | ICD-10-CM | POA: Diagnosis not present

## 2015-10-13 DIAGNOSIS — R109 Unspecified abdominal pain: Secondary | ICD-10-CM

## 2015-10-13 DIAGNOSIS — D124 Benign neoplasm of descending colon: Secondary | ICD-10-CM | POA: Diagnosis not present

## 2015-10-13 DIAGNOSIS — K529 Noninfective gastroenteritis and colitis, unspecified: Secondary | ICD-10-CM | POA: Insufficient documentation

## 2015-10-13 HISTORY — DX: Other specified postprocedural states: R11.2

## 2015-10-13 HISTORY — DX: Cardiac arrhythmia, unspecified: I49.9

## 2015-10-13 HISTORY — PX: COLONOSCOPY: SHX5424

## 2015-10-13 HISTORY — DX: Other specified postprocedural states: Z98.890

## 2015-10-13 SURGERY — COLONOSCOPY
Anesthesia: Moderate Sedation

## 2015-10-13 MED ORDER — MEPERIDINE HCL 100 MG/ML IJ SOLN
INTRAMUSCULAR | Status: AC
  Filled 2015-10-13: qty 2

## 2015-10-13 MED ORDER — ONDANSETRON HCL 4 MG/2ML IJ SOLN
INTRAMUSCULAR | Status: AC
  Filled 2015-10-13: qty 2

## 2015-10-13 MED ORDER — MIDAZOLAM HCL 5 MG/5ML IJ SOLN
INTRAMUSCULAR | Status: DC | PRN
  Administered 2015-10-13 (×2): 1 mg via INTRAVENOUS
  Administered 2015-10-13: 2 mg via INTRAVENOUS
  Administered 2015-10-13: 1 mg via INTRAVENOUS

## 2015-10-13 MED ORDER — MEPERIDINE HCL 100 MG/ML IJ SOLN
INTRAMUSCULAR | Status: DC | PRN
  Administered 2015-10-13: 25 mg via INTRAVENOUS

## 2015-10-13 MED ORDER — SODIUM CHLORIDE 0.9% FLUSH
INTRAVENOUS | Status: AC
  Filled 2015-10-13: qty 10

## 2015-10-13 MED ORDER — MIDAZOLAM HCL 5 MG/5ML IJ SOLN
INTRAMUSCULAR | Status: AC
  Filled 2015-10-13: qty 10

## 2015-10-13 MED ORDER — SODIUM CHLORIDE 0.9 % IV SOLN
INTRAVENOUS | Status: DC
  Administered 2015-10-13: 1000 mL via INTRAVENOUS

## 2015-10-13 MED ORDER — PROMETHAZINE HCL 25 MG/ML IJ SOLN
INTRAMUSCULAR | Status: AC
  Filled 2015-10-13: qty 1

## 2015-10-13 MED ORDER — PROMETHAZINE HCL 25 MG/ML IJ SOLN
12.5000 mg | Freq: Once | INTRAMUSCULAR | Status: AC
  Administered 2015-10-13: 12.5 mg via INTRAVENOUS

## 2015-10-13 MED ORDER — STERILE WATER FOR IRRIGATION IR SOLN
Status: DC | PRN
  Administered 2015-10-13: 10:00:00

## 2015-10-13 MED ORDER — ONDANSETRON HCL 4 MG/2ML IJ SOLN
INTRAMUSCULAR | Status: DC | PRN
  Administered 2015-10-13: 4 mg via INTRAVENOUS

## 2015-10-13 NOTE — Discharge Instructions (Signed)
Colonoscopy Discharge Instructions  Read the instructions outlined below and refer to this sheet in the next few weeks. These discharge instructions provide you with general information on caring for yourself after you leave the hospital. Your doctor may also give you specific instructions. While your treatment has been planned according to the most current medical practices available, unavoidable complications occasionally occur. If you have any problems or questions after discharge, call Dr. Gala Romney at [phone]. ACTIVITY  You may resume your regular activity, but move at a slower pace for the next 24 hours.   Take frequent rest periods for the next 24 hours.   Walking will help get rid of the air and reduce the bloated feeling in your belly (abdomen).   No driving for 24 hours (because of the medicine (anesthesia) used during the test).    Do not sign any important legal documents or operate any machinery for 24 hours (because of the anesthesia used during the test).  NUTRITION  Drink plenty of fluids.   You may resume your normal diet as instructed by your doctor.   Begin with a light meal and progress to your normal diet. Heavy or fried foods are harder to digest and may make you feel sick to your stomach (nauseated).   Avoid alcoholic beverages for 24 hours or as instructed.  MEDICATIONS  You may resume your normal medications unless your doctor tells you otherwise.  WHAT YOU CAN EXPECT TODAY  Some feelings of bloating in the abdomen.   Passage of more gas than usual.   Spotting of blood in your stool or on the toilet paper.  IF YOU HAD POLYPS REMOVED DURING THE COLONOSCOPY:  No aspirin products for 7 days or as instructed.   No alcohol for 7 days or as instructed.   Eat a soft diet for the next 24 hours.  FINDING OUT THE RESULTS OF YOUR TEST Not all test results are available during your visit. If your test results are not back during the visit, make an appointment  with your caregiver to find out the results. Do not assume everything is normal if you have not heard from your caregiver or the medical facility. It is important for you to follow up on all of your test results.  SEEK IMMEDIATE MEDICAL ATTENTION IF:  You have more than a spotting of blood in your stool.   Your belly is swollen (abdominal distention).   You are nauseated or vomiting.   You have a temperature over 101.   You have abdominal pain or discomfort that is severe or gets worse throughout the day.     Colon polyp and diverticulosis information provided  Further recommendations to follow pending review of pathology report  Resume Xarelto today        Colon Polyps Polyps are lumps of extra tissue growing inside the body. Polyps can grow in the large intestine (colon). Most colon polyps are noncancerous (benign). However, some colon polyps can become cancerous over time. Polyps that are larger than a pea may be harmful. To be safe, caregivers remove and test all polyps. CAUSES  Polyps form when mutations in the genes cause your cells to grow and divide even though no more tissue is needed. RISK FACTORS There are a number of risk factors that can increase your chances of getting colon polyps. They include:  Being older than 50 years.  Family history of colon polyps or colon cancer.  Long-term colon diseases, such as colitis or Crohn disease.  Being overweight.  Smoking.  Being inactive.  Drinking too much alcohol. SYMPTOMS  Most small polyps do not cause symptoms. If symptoms are present, they may include:  Blood in the stool. The stool may look dark red or black.  Constipation or diarrhea that lasts longer than 1 week. DIAGNOSIS People often do not know they have polyps until their caregiver finds them during a regular checkup. Your caregiver can use 4 tests to check for polyps:  Digital rectal exam. The caregiver wears gloves and feels inside the rectum.  This test would find polyps only in the rectum.  Barium enema. The caregiver puts a liquid called barium into your rectum before taking X-rays of your colon. Barium makes your colon look white. Polyps are dark, so they are easy to see in the X-ray pictures.  Sigmoidoscopy. A thin, flexible tube (sigmoidoscope) is placed into your rectum. The sigmoidoscope has a light and tiny camera in it. The caregiver uses the sigmoidoscope to look at the last third of your colon.  Colonoscopy. This test is like sigmoidoscopy, but the caregiver looks at the entire colon. This is the most common method for finding and removing polyps. TREATMENT  Any polyps will be removed during a sigmoidoscopy or colonoscopy. The polyps are then tested for cancer. PREVENTION  To help lower your risk of getting more colon polyps:  Eat plenty of fruits and vegetables. Avoid eating fatty foods.  Do not smoke.  Avoid drinking alcohol.  Exercise every day.  Lose weight if recommended by your caregiver.  Eat plenty of calcium and folate. Foods that are rich in calcium include milk, cheese, and broccoli. Foods that are rich in folate include chickpeas, kidney beans, and spinach. HOME CARE INSTRUCTIONS Keep all follow-up appointments as directed by your caregiver. You may need periodic exams to check for polyps. SEEK MEDICAL CARE IF: You notice bleeding during a bowel movement.   This information is not intended to replace advice given to you by your health care provider. Make sure you discuss any questions you have with your health care provider.   Document Released: 01/04/2004 Document Revised: 04/30/2014 Document Reviewed: 06/19/2011 Elsevier Interactive Patient Education 2016 [employer].   Diverticulosis Diverticulosis is the condition that develops when small pouches (diverticula) form in the wall of your colon. Your colon, or large intestine, is where water is absorbed and stool is formed. The pouches form when  the inside layer of your colon pushes through weak spots in the outer layers of your colon. CAUSES  No one knows exactly what causes diverticulosis. RISK FACTORS  Being older than 2. Your risk for this condition increases with age. Diverticulosis is rare in people younger than 40 years. By age 85, almost everyone has it.  Eating a low-fiber diet.  Being frequently constipated.  Being overweight.  Not getting enough exercise.  Smoking.  Taking over-the-counter pain medicines, like aspirin and ibuprofen. SYMPTOMS  Most people with diverticulosis do not have symptoms. DIAGNOSIS  Because diverticulosis often has no symptoms, health care providers often discover the condition during an exam for other colon problems. In many cases, a health care provider will diagnose diverticulosis while using a flexible scope to examine the colon (colonoscopy). TREATMENT  If you have never developed an infection related to diverticulosis, you may not need treatment. If you have had an infection before, treatment may include:  Eating more fruits, vegetables, and grains.  Taking a fiber supplement.  Taking a live bacteria supplement (probiotic).  Taking medicine  to relax your colon. HOME CARE INSTRUCTIONS   Drink at least 6-8 glasses of water each day to prevent constipation.  Try not to strain when you have a bowel movement.  Keep all follow-up appointments. If you have had an infection before:  Increase the fiber in your diet as directed by your health care provider or dietitian.  Take a dietary fiber supplement if your health care provider approves.  Only take medicines as directed by your health care provider. SEEK MEDICAL CARE IF:   You have abdominal pain.  You have bloating.  You have cramps.  You have not gone to the bathroom in 3 days. SEEK IMMEDIATE MEDICAL CARE IF:   Your pain gets worse.  Yourbloating becomes very bad.  You have a fever or chills, and your  symptoms suddenly get worse.  You begin vomiting.  You have bowel movements that are bloody or black. MAKE SURE YOU:  Understand these instructions.  Will watch your condition.  Will get help right away if you are not doing well or get worse.   This information is not intended to replace advice given to you by your health care provider. Make sure you discuss any questions you have with your health care provider.   Document Released: 01/05/2004 Document Revised: 04/14/2013 Document Reviewed: 03/04/2013 Elsevier Interactive Patient Education [employer].

## 2015-10-13 NOTE — Interval H&P Note (Signed)
History and Physical Interval Note:  10/13/2015 9:33 AM  [name] [name]  has presented today for surgery, with the diagnosis of rectal bleeding, abdominal pain, history of colon cancer  The various methods of treatment have been discussed with the patient and family. After consideration of risks, benefits and other options for treatment, the patient has consented to  Procedure(s) with comments: COLONOSCOPY (N/A) - 0930 as a surgical intervention .  The patient's history has been reviewed, patient examined, no change in status, stable for surgery.  I have reviewed the patient's chart and labs.  Questions were answered to the patient's satisfaction.     Hamlin Devine  No change. Diagnostic colonoscopy per plan. The risks, benefits, limitations, alternatives and imponderables have been reviewed with the patient. Questions have been answered. All parties are agreeable.

## 2015-10-13 NOTE — Op Note (Addendum)
[facility] Patient Name: [name] [name] Procedure Date: 10/13/2015 9:25 AM MRN: [id number] Date of Birth: 1940/08/23 Attending MD: Norvel Richards , MD CSN: [id number] Age: 75 Admit Type: Outpatient Procedure:                Ileo-colonoscopy with biopsy and snare polypectomy Indications:              Hematochezia, diarrhea Providers:                Norvel Richards, MD, Otis Peak B. Gwenlyn Perking RN, RN,                            Randa Spike, Technician Referring MD:              Medicines:                Midazolam 5 mg IV, Meperidine 125 mg IV,                            Promethazine 12.5 mg IV, Ondansetron 4 mg IV Complications:            No immediate complications. Estimated Blood Loss:     Estimated blood loss was minimal. Procedure:                Pre-Anesthesia Assessment:                           - Prior to the procedure, a History and Physical                            was performed, and patient medications and                            allergies were reviewed. The patient's tolerance of                            previous anesthesia was also reviewed. The risks                            and benefits of the procedure and the sedation                            options and risks were discussed with the patient.                            All questions were answered, and informed consent                            was obtained. Prior Anticoagulants: The patient has                            taken no previous anticoagulant or antiplatelet                            agents and last took Xarelto (rivaroxaban) 2 days  prior to the procedure. ASA Grade Assessment: III -                            A patient with severe systemic disease. After                            reviewing the risks and benefits, the patient was                            deemed in satisfactory condition to undergo the                            procedure.               After obtaining informed consent, the colonoscope                            was passed under direct vision. Throughout the                            procedure, the patient's blood pressure, pulse, and                            oxygen saturations were monitored continuously. The                            [id number]) was introduced through the anus                            and advanced to the the cecum, identified by                            appendiceal orifice and ileocecal valve. The                            ileocecal valve, appendiceal orifice, and rectum                            were photographed. The entire colon was well                            visualized. The quality of the bowel preparation                            was adequate. Scope In: 9:42:11 AM Scope Out: 9:59:35 AM Scope Withdrawal Time: 0 hours 8 minutes 28 seconds  Total Procedure Duration: 0 hours 17 minutes 24 seconds  Findings:      The perianal and digital rectal examinations were normal. Grade 3 / 4       hemorrhoids.      Many medium-mouthed diverticula were found in the entire colon.      A 5 mm polyp was found in the descending colon. The polyp was       semi-pedunculated. The polyp was removed with a cold snare. Resection  and retrieval were complete. Estimated blood loss was minimal. Random       biopsies the colonic mucosa taken to assess for microscopic colitis.      The exam was otherwise without abnormality on direct and retroflexion       views. Surgical anastomosis at approximate well centimeters from anal       verge. Normal-appearing distal 5 cm of terminal ileal mucosa. Impression:               - Moderate diverticulosis in the entire examined                            colon.                           - One 5 mm polyp in the descending colon, removed                            with a cold snare. Resected and retrieved.                           - The  examination was otherwise normal on direct                            and retroflexion views. Moderate Sedation:      Moderate (conscious) sedation was administered by the endoscopy nurse       and supervised by the endoscopist. The following parameters were       monitored: oxygen saturation, heart rate, blood pressure, respiratory       rate, EKG, adequacy of pulmonary ventilation, and response to care.       Total physician intraservice time was 24 minutes. Recommendation:           - Patient has a contact number available for                            emergencies. The signs and symptoms of potential                            delayed complications were discussed with the                            patient. Return to normal activities tomorrow.                            Written discharge instructions were provided to the                            patient.                           - Advance diet as tolerated.                           - Continue present medications.                           - Await  pathology results.                           - Repeat colonoscopy date to be determined after                            pending pathology results are reviewed for                            surveillance based on pathology results.                           - Return to GI clinic after studies are complete. Procedure Code(s):        --- Professional ---                           [phone], Colonoscopy, flexible; with removal of                            tumor(s), polyp(s), or other lesion(s) by snare                            technique                           99152, Moderate sedation services provided by the                            same physician or other qualified health care                            professional performing the diagnostic or                            therapeutic service that the sedation supports,                            requiring the presence of an independent  trained                            observer to assist in the monitoring of the                            patient's level of consciousness and physiological                            status; initial 15 minutes of intraservice time,                            patient age 31 years or older                           [phone], Moderate sedation services; each additional  15 minutes intraservice time Diagnosis Code(s):        --- Professional ---                           D12.4, Benign neoplasm of descending colon                           K92.1, Melena (includes Hematochezia)                           K57.30, Diverticulosis of large intestine without                            perforation or abscess without bleeding CPT copyright 2016 American Medical Association. All rights reserved. The codes documented in this report are preliminary and upon coder review may  be revised to meet current compliance requirements. Cristopher Estimable. Sheray Grist, MD Norvel Richards, MD 10/13/2015 10:15:10 AM This report has been signed electronically. Number of Addenda: 0

## 2015-10-13 NOTE — H&P (View-Only) (Signed)
Referring Provider: Redmond School, MD Primary Care Physician:  Glenda Chroman, MD Primary GI:  Dr. Gala Romney  Chief Complaint  Patient presents with  . Rectal Bleeding    HPI:   [name] [name] is a 75 y.o. female who presents on referral from primary care for rectal bleeding and fecal incontinence. She was last seen by our office on 03/05/2013 for constipation, right upper quadrant pain, carcinoma in situ of colon. She had a lower anterior resection in 2009. At her last office visit was noted her colonoscopy is up-to-date and do again in 2019. Constipation on Linzess and Amitiza, symptoms improved and stopped taking all medications without recurrence of constipation. Right upper quadrant pain had resolved at that time with ultrasound and HIDA scan both on file, and LFTs normal. Recommended return for follow-up in one year which did not occur.  Last colonoscopy 2014 noted pancolonic diverticulosis and a single polyp in the hepatic flexure which was resected and found to be tubular adenoma on surgical pathology. Recommended 5 year repeat colonoscopy.  I saw primary care and 08/11/2015 at which point she was complaining of lower abdominal pain with a sudden onset and persistent pattern, mild. Also associated with bloating, bloody stools, diarrhea. Noted tenderness left lower quadrant and epigastric area. CBC, CMP ordered and not included with office notes. Referral to GI.  Today she states she was started on a blood thinner last year. Bleeding started about 4 months ago, initially as a light orange, then to streaks of blood on the stool. Stool was checked by PCP and was heme+. Also with lower abdominal pain. No significant change in bowel habits associated. Stools typically soft and pass easily, sometimes a little bit harder stools. Last couple days amount of blood is increasing as is frequency. Denies fever, chills, vomiting, unintentional weight loss. Has had some nausea over the past couple  days. Also intermittent increased fluctuance. GERD symptoms doing well. Denies chest pain, dyspnea, lightheadedness, syncope, near syncope. Denies any other upper or lower GI symptoms.   Past Medical History  Diagnosis Date  . Schatzki's ring   . Hematuria   . Hemorrhoids   . GERD (gastroesophageal reflux disease)   . DM (dermatomyositis)   . Hypercholesterolemia   . Diverticulosis   . Esophageal dysphagia   . Colon cancer ([place])     status post low anterior resection, limited stage disease requiring no adjuvant therapy  . Hiatal hernia   . Colon adenoma   . Asthma     Past Surgical History  Procedure Laterality Date  . Colonoscopy  11/09    Dr. Gala Romney- external hemorrhoidal tags o/w normal rectal mucosa, s/p surgical resection with a normal appearing anastomosis 12cm, pan colonic diverticulum  . Ingunal hernia repair    . Tubal ligation    . Low anterior bowel resection      NO ADJ CHEMO  . Appendectomy    . Vein ligation and stripping    . Esophagogastroduodenoscopy  07/2007    Dr. Daiva Nakayama cervical esophageal web, schatzki ring, large hiatal hernia  . Colonoscopy N/A 06/02/2012    [id number] post low anterior resection. Pancolonic diverticulosis. Colonic polyp-tubular adenoma. Surveillance due 2019.     Current Outpatient Prescriptions  Medication Sig Dispense Refill  . acetaminophen (TYLENOL) 325 MG tablet Take 650 mg by mouth every 6 (six) hours as needed for pain.    Marland Kitchen albuterol (PROVENTIL HFA;VENTOLIN HFA) 108 (90 BASE) MCG/ACT inhaler Inhale 2 puffs into the lungs every  6 (six) hours as needed for wheezing.    Marland Kitchen ALPRAZolam (XANAX) 0.5 MG tablet Take 0.5 mg by mouth 2 (two) times daily as needed. Anxiety/sleep.    Marland Kitchen amoxicillin (AMOXIL) 500 MG tablet Take 500 mg by mouth 3 (three) times daily before meals.    . calcium carbonate (OS-CAL) 600 MG TABS tablet Take 600 mg by mouth 2 (two) times daily with a meal.    . Multiple Vitamin (MULTIVITAMIN) tablet Take 1  tablet by mouth daily.      Marland Kitchen PARoxetine (PAXIL) 20 MG tablet Take 20 mg by mouth daily. Takes 1/2 tablet daily    . rivaroxaban (XARELTO) 20 MG TABS tablet Take 20 mg by mouth daily with supper.    . simvastatin (ZOCOR) 40 MG tablet Take 40 mg by mouth daily.     No current facility-administered medications for this visit.    Allergies as of 09/15/2015 - Review Complete 09/15/2015  Allergen Reaction Noted  . Hydrocodone-acetaminophen Hives 04/26/2010  . Iohexol  07/16/2008  . Nabumetone Rash 04/26/2010    Family History  Problem Relation Age of Onset  . Colon cancer Neg Hx     Social History   Social History  . Marital Status: Married    Spouse Name: N/A  . Number of Children: N/A  . Years of Education: N/A   Social History Main Topics  . Smoking status: Never Smoker   . Smokeless tobacco: Never Used  . Alcohol Use: 0.0 oz/week    0 Standard drinks or equivalent per week     Comment: Rarely  . Drug Use: No  . Sexual Activity: Not Asked   Other Topics Concern  . None   Social History Narrative    Review of Systems: General: Negative for anorexia, weight loss, fever, chills, fatigue, weakness. ENT: Negative for hoarseness, difficulty swallowing , nasal congestion. CV: Negative for chest pain, angina, palpitations, dyspnea on exertion, peripheral edema.  Respiratory: Negative for dyspnea at rest, dyspnea on exertion.  GI: See history of present illness. Endo: Negative for unusual weight change.  Heme: Negative for bruising or bleeding.   Physical Exam: BP 120/74 mmHg  Pulse 90  Temp(Src) 98.1 F (36.7 C) (Oral)  Ht 5\' 2"  (1.575 m)  Wt 155 lb (70.308 kg)  BMI 28.34 kg/m2 General:   Alert and oriented. Pleasant and cooperative. Well-nourished and well-developed.  Head:  Normocephalic and atraumatic. Eyes:  Without icterus, sclera clear and conjunctiva pink.  Ears:  Normal auditory acuity. Cardiovascular:  S1, S2 present with systolic murmur appreciated.  Extremities without clubbing or edema. Respiratory:  Clear to auscultation bilaterally. No  rales or rhonchi. Minimal end expiratory wheezes consistent with history of asthma with recent URI. No distress.  Gastrointestinal:  +BS, soft, and non-distended. Mild LLQ TTP. No HSM noted. No guarding or rebound. No masses appreciated.  Rectal:  Deferred  Musculoskalatal:  Symmetrical without gross deformities. Neurologic:  Alert and oriented x4;  grossly normal neurologically. Psych:  Alert and cooperative. Normal mood and affect. Heme/Lymph/Immune: No excessive bruising noted.    09/15/2015 9:05 AM   Disclaimer: This note was dictated with voice recognition software. Similar sounding words can inadvertently be transcribed and may not be corrected upon review.

## 2015-10-17 ENCOUNTER — Encounter (HOSPITAL_COMMUNITY): Payer: Self-pay | Admitting: Internal Medicine

## 2015-10-17 ENCOUNTER — Encounter: Payer: Self-pay | Admitting: Cardiology

## 2015-10-17 DIAGNOSIS — Z6833 Body mass index (BMI) 33.0-33.9, adult: Secondary | ICD-10-CM | POA: Diagnosis not present

## 2015-10-17 DIAGNOSIS — R5383 Other fatigue: Secondary | ICD-10-CM | POA: Diagnosis not present

## 2015-10-17 DIAGNOSIS — Z1389 Encounter for screening for other disorder: Secondary | ICD-10-CM | POA: Diagnosis not present

## 2015-10-17 DIAGNOSIS — Z7189 Other specified counseling: Secondary | ICD-10-CM | POA: Diagnosis not present

## 2015-10-17 DIAGNOSIS — E7800 Pure hypercholesterolemia, unspecified: Secondary | ICD-10-CM | POA: Diagnosis not present

## 2015-10-17 DIAGNOSIS — Z79899 Other long term (current) drug therapy: Secondary | ICD-10-CM | POA: Diagnosis not present

## 2015-10-17 DIAGNOSIS — Z0000 Encounter for general adult medical examination without abnormal findings: Secondary | ICD-10-CM | POA: Diagnosis not present

## 2015-10-17 DIAGNOSIS — Z299 Encounter for prophylactic measures, unspecified: Secondary | ICD-10-CM | POA: Diagnosis not present

## 2015-10-20 ENCOUNTER — Telehealth: Payer: Self-pay

## 2015-10-20 ENCOUNTER — Encounter: Payer: Self-pay | Admitting: Internal Medicine

## 2015-10-20 NOTE — Telephone Encounter (Signed)
Per RMR- Send letter to patient.  Send copy of letter with path to referring provider and PCP.   Offer appt w extender in coming weeks in reference to incontinence and rectal bleeding

## 2015-10-20 NOTE — Telephone Encounter (Signed)
Letter mailed to the pt. 

## 2015-10-21 NOTE — Telephone Encounter (Signed)
APPT MADE

## 2015-11-10 DIAGNOSIS — I4891 Unspecified atrial fibrillation: Secondary | ICD-10-CM | POA: Diagnosis not present

## 2015-11-10 DIAGNOSIS — M159 Polyosteoarthritis, unspecified: Secondary | ICD-10-CM | POA: Diagnosis not present

## 2015-11-10 DIAGNOSIS — E7800 Pure hypercholesterolemia, unspecified: Secondary | ICD-10-CM | POA: Diagnosis not present

## 2015-11-10 DIAGNOSIS — J449 Chronic obstructive pulmonary disease, unspecified: Secondary | ICD-10-CM | POA: Diagnosis not present

## 2015-11-22 DIAGNOSIS — Z1231 Encounter for screening mammogram for malignant neoplasm of breast: Secondary | ICD-10-CM | POA: Diagnosis not present

## 2015-11-28 DIAGNOSIS — Z8041 Family history of malignant neoplasm of ovary: Secondary | ICD-10-CM | POA: Diagnosis not present

## 2015-11-28 DIAGNOSIS — Z315 Encounter for genetic counseling: Secondary | ICD-10-CM | POA: Diagnosis not present

## 2015-11-28 DIAGNOSIS — Z1379 Encounter for other screening for genetic and chromosomal anomalies: Secondary | ICD-10-CM | POA: Diagnosis not present

## 2015-11-28 DIAGNOSIS — Z85038 Personal history of other malignant neoplasm of large intestine: Secondary | ICD-10-CM | POA: Diagnosis not present

## 2015-11-28 DIAGNOSIS — Z803 Family history of malignant neoplasm of breast: Secondary | ICD-10-CM | POA: Diagnosis not present

## 2015-11-29 DIAGNOSIS — F411 Generalized anxiety disorder: Secondary | ICD-10-CM | POA: Diagnosis not present

## 2015-11-29 DIAGNOSIS — I4891 Unspecified atrial fibrillation: Secondary | ICD-10-CM | POA: Diagnosis not present

## 2015-12-15 DIAGNOSIS — E7800 Pure hypercholesterolemia, unspecified: Secondary | ICD-10-CM | POA: Diagnosis not present

## 2015-12-15 DIAGNOSIS — J449 Chronic obstructive pulmonary disease, unspecified: Secondary | ICD-10-CM | POA: Diagnosis not present

## 2015-12-15 DIAGNOSIS — M159 Polyosteoarthritis, unspecified: Secondary | ICD-10-CM | POA: Diagnosis not present

## 2015-12-15 DIAGNOSIS — I4891 Unspecified atrial fibrillation: Secondary | ICD-10-CM | POA: Diagnosis not present

## 2015-12-16 ENCOUNTER — Ambulatory Visit (INDEPENDENT_AMBULATORY_CARE_PROVIDER_SITE_OTHER): Payer: Medicare Other | Admitting: Nurse Practitioner

## 2015-12-16 ENCOUNTER — Encounter: Payer: Self-pay | Admitting: Nurse Practitioner

## 2015-12-16 VITALS — BP 117/77 | HR 91 | Temp 98.1°F | Ht 62.5 in | Wt 157.0 lb

## 2015-12-16 DIAGNOSIS — K5900 Constipation, unspecified: Secondary | ICD-10-CM

## 2015-12-16 DIAGNOSIS — K625 Hemorrhage of anus and rectum: Secondary | ICD-10-CM | POA: Diagnosis not present

## 2015-12-16 DIAGNOSIS — K219 Gastro-esophageal reflux disease without esophagitis: Secondary | ICD-10-CM

## 2015-12-16 MED ORDER — OMEPRAZOLE 20 MG PO CPDR: 20.0000 mg | DELAYED_RELEASE_CAPSULE | Freq: Every day | ORAL | 3 refills | Status: DC

## 2015-12-16 NOTE — Assessment & Plan Note (Signed)
Continued rectal bleeding intermittently about once a month. Does also have constipation. Recommend she try Preparation H when the bleeding starts. If this does not improve significantly we can send an Anusol suppositories or cream to the pharmacy to help. Colonoscopy just completed and is reassuring. Return for follow-up in 3 months.

## 2015-12-16 NOTE — Assessment & Plan Note (Signed)
Patient with flare up of GERD symptoms as of late. Includes epigastric pain, esophageal burning. I will start her on Prilosec 20 mg once a day. Return for follow-up in 3 months.

## 2015-12-16 NOTE — Patient Instructions (Signed)
1. I send Prilosec (acid blocker) to your pharmacy. Take 1 pill, once a day, 30 minutes before your first meal today. 2. For rectal bleeding you can try Preparation H when you notice bleeding has started. If this not is not effective enough for you, call our office and we can send in a prescription hemorrhoid cream to your pharmacy. 3. Take Colace stool softener every day. If you notice your stools become hard or/more constipated on the stool softener you can use MiraLAX up to once a day as needed for constipation. 4. Return for follow-up in 3 months.

## 2015-12-16 NOTE — Progress Notes (Signed)
Referring Provider: Glenda Chroman, MD Primary Care Physician:  Glenda Chroman, MD Primary GI:  Dr. Gala Romney  Chief Complaint  Patient presents with  . Follow-up    doing ok    HPI:   [name] [name] is a 75 y.o. female who presents for follow-up on colonoscopy. Last seen in our office 09/15/2015 for rectal bleeding, lower abdominal pain, and personal history of colon cancer. At that time she noted a 3 month history of rectal bleeding with external hemorrhoids, recently started on Xarelto. At that time also noted mild intermittent lower abdominal pain. Personal history of colon cancer status post low anterior resection in 1999 with previous colonoscopy 3 and half years ago with a single tubular adenoma. She was referred for diagnostic colonoscopy which is completed on 10/13/2015. Colonoscopy found moderate diverticulosis in the entire colon, a single 5 mm polyp in the descending colon status post removal, otherwise normal exam. Pathology found the polyp to be a serrated polyp/adenoma without dysplasia. Recommended 1 more colonoscopy in 3 years if overall health status permits.  Today she states she's doing well overall. Is having some spots of toilet tissue hematochezia intermittently about 1-2 days in duration then resolves for weeks to months at a time. She is still on Xarelto. Has not tried hemorrhoid topical therapy yet. Occasional intermittent abdominal pain. Occasional constipation, takes colace as needed/when she thinks about it. Also with new onset epigastric pain this week, occasional heartburn, uses rolaids which helps. Denies any other upper or lower GI symptoms.  Past Medical History:  Diagnosis Date  . Asthma   . Colon adenoma   . Colon cancer ([place])    status post low anterior resection, limited stage disease requiring no adjuvant therapy  . Diverticulosis   . DM (dermatomyositis)   . Dysrhythmia   . Esophageal dysphagia   . GERD (gastroesophageal reflux disease)   .  Hematuria   . Hemorrhoids   . Hiatal hernia   . Hypercholesterolemia   . PONV (postoperative nausea and vomiting)   . Schatzki's ring     Past Surgical History:  Procedure Laterality Date  . APPENDECTOMY    . COLONOSCOPY  11/09   Dr. Gala Romney- external hemorrhoidal tags o/w normal rectal mucosa, s/p surgical resection with a normal appearing anastomosis 12cm, pan colonic diverticulum  . COLONOSCOPY N/A 06/02/2012   [id number] post low anterior resection. Pancolonic diverticulosis. Colonic polyp-tubular adenoma. Surveillance due 2019.   Marland Kitchen COLONOSCOPY N/A 10/13/2015   Procedure: COLONOSCOPY;  Surgeon: Daneil Dolin, MD;  Location: [name];  Service: Endoscopy;  Laterality: N/A;  0930  . ESOPHAGOGASTRODUODENOSCOPY  07/2007   Dr. Daiva Nakayama cervical esophageal web, schatzki ring, large hiatal hernia  . INGUNAL HERNIA REPAIR    . LOW ANTERIOR BOWEL RESECTION     NO ADJ CHEMO  . TUBAL LIGATION    . VEIN LIGATION AND STRIPPING      Current Outpatient Prescriptions  Medication Sig Dispense Refill  . acetaminophen (TYLENOL) 325 MG tablet Take 650 mg by mouth every 6 (six) hours as needed for pain.    Marland Kitchen albuterol (PROVENTIL HFA;VENTOLIN HFA) 108 (90 BASE) MCG/ACT inhaler Inhale 2 puffs into the lungs every 6 (six) hours as needed for wheezing.    Marland Kitchen ALPRAZolam (XANAX) 0.5 MG tablet Take 0.5 mg by mouth 2 (two) times daily as needed. Anxiety/sleep.    . calcium carbonate (OS-CAL) 600 MG TABS tablet Take 600 mg by mouth 2 (two) times daily with a  meal.    . Coenzyme Q10 100 MG TABS Take 1 tablet by mouth daily.    . metoprolol tartrate (LOPRESSOR) 25 MG tablet Take 25 mg by mouth daily. Takes 12.5mg  daily    . Multiple Vitamin (MULTIVITAMIN) tablet Take 1 tablet by mouth daily.      Marland Kitchen PARoxetine (PAXIL) 20 MG tablet Take 20 mg by mouth daily.     . rivaroxaban (XARELTO) 20 MG TABS tablet Take 20 mg by mouth daily with supper.    . simvastatin (ZOCOR) 40 MG tablet Take 40 mg by mouth  daily.    . polyethylene glycol-electrolytes (NULYTELY/GOLYTELY) 420 g solution Take 4,000 mLs by mouth once. (Patient not taking: Reported on 12/16/2015) 4000 mL 0   No current facility-administered medications for this visit.     Allergies as of 12/16/2015 - Review Complete 12/16/2015  Allergen Reaction Noted  . Hydrocodone-acetaminophen Hives 04/26/2010  . Iohexol  07/16/2008  . Nabumetone Rash 04/26/2010  . Prednisone Rash 10/13/2015    Family History  Problem Relation Age of Onset  . Colon cancer Neg Hx   . Breast cancer Mother   . Rheum arthritis Father   . Cancer - Lung Sister   . Brain cancer Sister   . Prostate cancer Brother   . Aneurysm Brother     Social History   Social History  . Marital status: Married    Spouse name: N/A  . Number of children: N/A  . Years of education: N/A   Social History Main Topics  . Smoking status: Never Smoker  . Smokeless tobacco: Never Used  . Alcohol use 0.0 oz/week     Comment: Rarely  . Drug use: No  . Sexual activity: Not Asked   Other Topics Concern  . None   Social History Narrative  . None    Review of Systems: General: Negative for anorexia, weight loss, fever, chills, fatigue, weakness. ENT: Negative for hoarseness, difficulty swallowing. CV: Negative for chest pain, angina, palpitations, peripheral edema.  Respiratory: Negative for dyspnea at rest, cough, sputum, wheezing.  GI: See history of present illness. Endo: Negative for unusual weight change.  Heme: Negative for bruising or bleeding.   Physical Exam: BP 117/77   Pulse 91   Temp 98.1 F (36.7 C) (Oral)   Ht 5' 2.5" (1.588 m)   Wt 157 lb (71.2 kg)   BMI 28.26 kg/m  General:   Alert and oriented. Pleasant and cooperative. Well-nourished and well-developed.  Eyes:  Without icterus, sclera clear and conjunctiva pink.  Ears:  Normal auditory acuity. Cardiovascular:  S1, S2 present without murmurs appreciated. Extremities without clubbing or  edema. Respiratory:  Clear to auscultation bilaterally. No wheezes, rales, or rhonchi. No distress.  Gastrointestinal:  +BS, soft, and non-distended. Minimally increased TTP epigastric area. No HSM noted. No guarding or rebound. No masses appreciated.  Rectal:  Deferred  Musculoskalatal:  Symmetrical without gross deformities. Neurologic:  Alert and oriented x4;  grossly normal neurologically. Psych:  Alert and cooperative. Normal mood and affect. Heme/Lymph/Immune: No excessive bruising noted.    12/16/2015 9:24 AM   Disclaimer: This note was dictated with voice recognition software. Similar sounding words can inadvertently be transcribed and may not be corrected upon review.

## 2015-12-16 NOTE — Progress Notes (Signed)
cc'ed to pcp °

## 2015-12-16 NOTE — Assessment & Plan Note (Signed)
Patient with constipation. This has been chronic for her and typically takes stool softener daily, however lately she has not been taking it every day. Recommend she take Colace stool softener daily. If her stools become hard or despite stool softener she can try MiraLAX up to daily as needed. Return for follow-up in 3 months for further evaluation and symptom progression.

## 2015-12-19 ENCOUNTER — Ambulatory Visit: Payer: Medicare Other | Admitting: Nurse Practitioner

## 2015-12-28 ENCOUNTER — Encounter: Payer: Self-pay | Admitting: *Deleted

## 2015-12-28 DIAGNOSIS — Z23 Encounter for immunization: Secondary | ICD-10-CM | POA: Diagnosis not present

## 2015-12-29 ENCOUNTER — Encounter: Payer: Self-pay | Admitting: Cardiology

## 2015-12-29 ENCOUNTER — Ambulatory Visit (INDEPENDENT_AMBULATORY_CARE_PROVIDER_SITE_OTHER): Payer: Medicare Other | Admitting: Cardiology

## 2015-12-29 ENCOUNTER — Telehealth: Payer: Self-pay | Admitting: *Deleted

## 2015-12-29 VITALS — BP 106/70 | HR 90 | Ht 63.0 in | Wt 158.0 lb

## 2015-12-29 DIAGNOSIS — I4891 Unspecified atrial fibrillation: Secondary | ICD-10-CM | POA: Diagnosis not present

## 2015-12-29 DIAGNOSIS — I340 Nonrheumatic mitral (valve) insufficiency: Secondary | ICD-10-CM

## 2015-12-29 DIAGNOSIS — E785 Hyperlipidemia, unspecified: Secondary | ICD-10-CM | POA: Diagnosis not present

## 2015-12-29 MED ORDER — METOPROLOL SUCCINATE ER 25 MG PO TB24: 25.0000 mg | ORAL_TABLET | Freq: Every day | ORAL | 3 refills | Status: DC

## 2015-12-29 MED ORDER — RIVAROXABAN 20 MG PO TABS: 20.0000 mg | ORAL_TABLET | Freq: Every day | ORAL | 0 refills | Status: DC

## 2015-12-29 NOTE — Patient Instructions (Addendum)
Your physician recommends that you schedule a follow-up appointment in: 3 MONTHS WITH DR. Ong  Your physician has recommended you make the following change in your medication:  INCREASE TOPROL XL 25 MG DAILY  Your physician has requested that you have an echocardiogram. Echocardiography is a painless test that uses sound waves to create images of your heart. It provides your doctor with information about the size and shape of your heart and how well your heart's chambers and valves are working. This procedure takes approximately one hour. There are no restrictions for this procedure.   WE HAVE GIVEN YOU SAMPLES OF XARELTO   Thank you for choosing [facility]!!

## 2015-12-29 NOTE — Telephone Encounter (Signed)
LM on pt VM that she has left samples of Xarelto at office and may pick up. Left in samples closet

## 2015-12-29 NOTE — Progress Notes (Signed)
Clinical Summary [name] [name] is a 75 y.o.female seen as a new patient. She is referred by Dr Woody Seller. She is a former patient of [facility] Dr Hamilton Capri.   1. Paroxysmal afib - occasional palpiations, about once month. Lasts for a few minutes - compliant with meds. Occasional blood with BM's, had colonoscopy fairly benign. Can have some blood on toilet paper. Stable H&H from last labs.  - xarelto was $45 a month, now up to $160  2. Mitral regurgitation - echo at last [facility] Jan 2017. Report not available, from clinic note states it showed normal LVEF, 3+(modarte) eccentric MR with immobile posterior leaflet.  - can have some SOB that's variable, often varies with weather. Some days DOE <1 block, other days can go multiple blocks. Can have some occasional LE edema, she assoicates more with varicose vein. Occasional orthopnea.   3. Hyperlipidemia - 09/2015 TC 148 TG 163 HDL 48 LDL 67 - compliant with statin   Past Medical History:  Diagnosis Date  . Asthma   . Colon adenoma   . Colon cancer ([place])    status post low anterior resection, limited stage disease requiring no adjuvant therapy  . Diverticulosis   . DM (dermatomyositis)   . Dysrhythmia   . Esophageal dysphagia   . GERD (gastroesophageal reflux disease)   . Hematuria   . Hemorrhoids   . Hiatal hernia   . Hypercholesterolemia   . PONV (postoperative nausea and vomiting)   . Schatzki's ring      Allergies  Allergen Reactions  . Hydrocodone-Acetaminophen Hives  . Iohexol      Code: HIVES, Desc: pt. had a severe allergic reaction to IV contrast the last time she was injected and had to be seen in the ER.  Hives and sob.   . Nabumetone Rash  . Prednisone Rash     Current Outpatient Prescriptions  Medication Sig Dispense Refill  . acetaminophen (TYLENOL) 325 MG tablet Take 650 mg by mouth every 6 (six) hours as needed for pain.    Marland Kitchen albuterol (PROVENTIL HFA;VENTOLIN HFA) 108 (90 BASE) MCG/ACT inhaler  Inhale 2 puffs into the lungs every 6 (six) hours as needed for wheezing.    Marland Kitchen ALPRAZolam (XANAX) 0.5 MG tablet Take 0.5 mg by mouth 2 (two) times daily as needed. Anxiety/sleep.    . calcium carbonate (OS-CAL) 600 MG TABS tablet Take 600 mg by mouth 2 (two) times daily with a meal.    . Coenzyme Q10 100 MG TABS Take 1 tablet by mouth daily.    . metoprolol succinate (TOPROL-XL) 25 MG 24 hr tablet Take 12.5 mg by mouth daily.    . Multiple Vitamin (MULTIVITAMIN) tablet Take 1 tablet by mouth daily.      Marland Kitchen omeprazole (PRILOSEC) 20 MG capsule Take 1 capsule (20 mg total) by mouth daily. 30 capsule 3  . PARoxetine (PAXIL) 20 MG tablet Take 20 mg by mouth daily.     . rivaroxaban (XARELTO) 20 MG TABS tablet Take 20 mg by mouth daily with supper.    . simvastatin (ZOCOR) 40 MG tablet Take 40 mg by mouth daily.    . traMADol (ULTRAM) 50 MG tablet Take 50 mg by mouth 2 (two) times daily.     No current facility-administered medications for this visit.      Past Surgical History:  Procedure Laterality Date  . APPENDECTOMY    . COLONOSCOPY  11/09   Dr. Gala Romney- external hemorrhoidal tags o/w normal rectal  mucosa, s/p surgical resection with a normal appearing anastomosis 12cm, pan colonic diverticulum  . COLONOSCOPY N/A 06/02/2012   [id number] post low anterior resection. Pancolonic diverticulosis. Colonic polyp-tubular adenoma. Surveillance due 2019.   Marland Kitchen COLONOSCOPY N/A 10/13/2015   Procedure: COLONOSCOPY;  Surgeon: Daneil Dolin, MD;  Location: [name];  Service: Endoscopy;  Laterality: N/A;  0930  . ESOPHAGOGASTRODUODENOSCOPY  07/2007   Dr. Daiva Nakayama cervical esophageal web, schatzki ring, large hiatal hernia  . INGUNAL HERNIA REPAIR    . LOW ANTERIOR BOWEL RESECTION     NO ADJ CHEMO  . TUBAL LIGATION    . VEIN LIGATION AND STRIPPING       Allergies  Allergen Reactions  . Hydrocodone-Acetaminophen Hives  . Iohexol      Code: HIVES, Desc: pt. had a severe allergic reaction to  IV contrast the last time she was injected and had to be seen in the ER.  Hives and sob.   . Nabumetone Rash  . Prednisone Rash      Family History  Problem Relation Age of Onset  . Breast cancer Mother   . Rheum arthritis Father   . Cancer - Lung Sister   . Brain cancer Sister   . Prostate cancer Brother   . Aneurysm Brother   . Colon cancer Neg Hx      Social History Ms. Zion reports that she has never smoked. She has never used smokeless tobacco. Ms. Aroche reports that she drinks alcohol.   Review of Systems CONSTITUTIONAL: No weight loss, fever, chills, weakness or fatigue.  HEENT: Eyes: No visual loss, blurred vision, double vision or yellow sclerae.No hearing loss, sneezing, congestion, runny nose or sore throat.  SKIN: No rash or itching.  CARDIOVASCULAR: per HPI RESPIRATORY: No shortness of breath, cough or sputum.  GASTROINTESTINAL: No anorexia, nausea, vomiting or diarrhea. No abdominal pain or blood.  GENITOURINARY: No burning on urination, no polyuria NEUROLOGICAL: No headache, dizziness, syncope, paralysis, ataxia, numbness or tingling in the extremities. No change in bowel or bladder control.  MUSCULOSKELETAL: No muscle, back pain, joint pain or stiffness.  LYMPHATICS: No enlarged nodes. No history of splenectomy.  PSYCHIATRIC: No history of depression or anxiety.  ENDOCRINOLOGIC: No reports of sweating, cold or heat intolerance. No polyuria or polydipsia.  Marland Kitchen   Physical Examination Vitals:   12/29/15 1345 12/29/15 1351  BP: 109/73 106/70  Pulse: 95 90   Filed Weights   12/29/15 1345  Weight: 158 lb (71.7 kg)    Gen: resting comfortably, no acute distress HEENT: no scleral icterus, pupils equal round and reactive, no palptable cervical adenopathy,  CV: RRR, 3/6 sysootlic murmur apex, no jvd Resp: Clear to auscultation bilaterally GI: abdomen is soft, non-tender, non-distended, normal bowel sounds, no hepatosplenomegaly MSK: extremities  are warm, no edema.  Skin: warm, no rash Neuro:  no focal deficits Psych: appropriate affect    Assessment and Plan  1. PAF - occasional symptoms, we will increase TOprol Xl to 25mg  daily - continue xarelto, cost may be an issue in the future, may need consideration for coumadin in the future - EKG in clinic shows rate controlled afib  2. Mitral regurgitation - repeat echo.   3. Hyperlipidemia - continue current statin     Arnoldo Lenis, M.D

## 2016-01-19 DIAGNOSIS — Z299 Encounter for prophylactic measures, unspecified: Secondary | ICD-10-CM | POA: Diagnosis not present

## 2016-01-19 DIAGNOSIS — J441 Chronic obstructive pulmonary disease with (acute) exacerbation: Secondary | ICD-10-CM | POA: Diagnosis not present

## 2016-01-19 DIAGNOSIS — G4700 Insomnia, unspecified: Secondary | ICD-10-CM | POA: Diagnosis not present

## 2016-01-19 DIAGNOSIS — H43811 Vitreous degeneration, right eye: Secondary | ICD-10-CM | POA: Diagnosis not present

## 2016-01-19 DIAGNOSIS — H524 Presbyopia: Secondary | ICD-10-CM | POA: Diagnosis not present

## 2016-01-19 DIAGNOSIS — I4891 Unspecified atrial fibrillation: Secondary | ICD-10-CM | POA: Diagnosis not present

## 2016-01-19 DIAGNOSIS — E785 Hyperlipidemia, unspecified: Secondary | ICD-10-CM | POA: Diagnosis not present

## 2016-01-25 ENCOUNTER — Other Ambulatory Visit: Payer: Self-pay

## 2016-01-25 ENCOUNTER — Ambulatory Visit (INDEPENDENT_AMBULATORY_CARE_PROVIDER_SITE_OTHER): Payer: Medicare Other

## 2016-01-25 DIAGNOSIS — I340 Nonrheumatic mitral (valve) insufficiency: Secondary | ICD-10-CM | POA: Diagnosis not present

## 2016-01-30 DIAGNOSIS — H40013 Open angle with borderline findings, low risk, bilateral: Secondary | ICD-10-CM | POA: Diagnosis not present

## 2016-02-01 ENCOUNTER — Telehealth: Payer: Self-pay | Admitting: *Deleted

## 2016-02-01 ENCOUNTER — Encounter: Payer: Self-pay | Admitting: *Deleted

## 2016-02-01 NOTE — Progress Notes (Unsigned)
Opened in error

## 2016-02-01 NOTE — Telephone Encounter (Signed)
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 I'd like to discuss this with her in more detail and discuss possible additional testing of the heart valve. Can she see me Oct 19 at 340pm?  [name] MD

## 2016-02-01 NOTE — Telephone Encounter (Signed)
Pt aware and agreeable for 10/19 @340 . Routed to pcp

## 2016-02-06 ENCOUNTER — Ambulatory Visit (INDEPENDENT_AMBULATORY_CARE_PROVIDER_SITE_OTHER): Payer: Medicare Other | Admitting: Cardiology

## 2016-02-06 ENCOUNTER — Encounter: Payer: Self-pay | Admitting: Cardiology

## 2016-02-06 VITALS — BP 114/77 | HR 86 | Ht 63.0 in | Wt 159.0 lb

## 2016-02-06 DIAGNOSIS — I340 Nonrheumatic mitral (valve) insufficiency: Secondary | ICD-10-CM | POA: Diagnosis not present

## 2016-02-06 DIAGNOSIS — R0609 Other forms of dyspnea: Secondary | ICD-10-CM

## 2016-02-06 NOTE — Progress Notes (Signed)
Clinical Summary [name] [name] is a 75 y.o.female seen today for follow up of the following medical problems. This is a focused visit on her history of mitral regurgitation.    1. Mitral regurgitation - echo at last [facility] Jan 2017 showed normal LVEF, 3+(modarte) eccentric MR with immobile posterior leaflet.  - can have some SOB that's variable, often varies with weather. Some days DOE <1 block, other days can go multiple blocks. Can have some occasional LE edema, she assoicates more with varicose vein. Occasional orthopnea.   - repeat echo 01/2016 moderate to severe MR. LVEF 60-65%. LVIDs 31. Symptoms unchanged since last visit.     Past Medical History:  Diagnosis Date  . Asthma   . Colon adenoma   . Colon cancer ([place])    status post low anterior resection, limited stage disease requiring no adjuvant therapy  . Diverticulosis   . DM (dermatomyositis)   . Dysrhythmia   . Esophageal dysphagia   . GERD (gastroesophageal reflux disease)   . Hematuria   . Hemorrhoids   . Hiatal hernia   . Hypercholesterolemia   . PONV (postoperative nausea and vomiting)   . Schatzki's ring      Allergies  Allergen Reactions  . Hydrocodone-Acetaminophen Hives  . Iohexol      Code: HIVES, Desc: pt. had a severe allergic reaction to IV contrast the last time she was injected and had to be seen in the ER.  Hives and sob.   . Nabumetone Rash  . Prednisone Rash     Current Outpatient Prescriptions  Medication Sig Dispense Refill  . acetaminophen (TYLENOL) 325 MG tablet Take 650 mg by mouth every 6 (six) hours as needed for pain.    Marland Kitchen albuterol (PROVENTIL HFA;VENTOLIN HFA) 108 (90 BASE) MCG/ACT inhaler Inhale 2 puffs into the lungs every 6 (six) hours as needed for wheezing.    Marland Kitchen ALPRAZolam (XANAX) 0.5 MG tablet Take 0.5 mg by mouth 2 (two) times daily as needed. Anxiety/sleep.    . calcium carbonate (OS-CAL) 600 MG TABS tablet Take 600 mg by mouth 2 (two) times daily with a meal.    .  Coenzyme Q10 100 MG TABS Take 1 tablet by mouth daily.    . metoprolol succinate (TOPROL-XL) 25 MG 24 hr tablet Take 1 tablet (25 mg total) by mouth daily. 90 tablet 3  . Multiple Vitamin (MULTIVITAMIN) tablet Take 1 tablet by mouth daily.      Marland Kitchen omeprazole (PRILOSEC) 20 MG capsule Take 1 capsule (20 mg total) by mouth daily. 30 capsule 3  . PARoxetine (PAXIL) 20 MG tablet Take 20 mg by mouth daily.     . rivaroxaban (XARELTO) 20 MG TABS tablet Take 1 tablet (20 mg total) by mouth daily with supper. 28 tablet 0  . simvastatin (ZOCOR) 40 MG tablet Take 40 mg by mouth daily.    . traMADol (ULTRAM) 50 MG tablet Take 50 mg by mouth 2 (two) times daily.     No current facility-administered medications for this visit.      Past Surgical History:  Procedure Laterality Date  . APPENDECTOMY    . COLONOSCOPY  11/09   Dr. Gala Romney- external hemorrhoidal tags o/w normal rectal mucosa, s/p surgical resection with a normal appearing anastomosis 12cm, pan colonic diverticulum  . COLONOSCOPY N/A 06/02/2012   [id number] post low anterior resection. Pancolonic diverticulosis. Colonic polyp-tubular adenoma. Surveillance due 2019.   Marland Kitchen COLONOSCOPY N/A 10/13/2015   Procedure: COLONOSCOPY;  Surgeon: Herbie Baltimore  Hilton Cork, MD;  Location: [name];  Service: Endoscopy;  Laterality: N/A;  0930  . ESOPHAGOGASTRODUODENOSCOPY  07/2007   Dr. Daiva Nakayama cervical esophageal web, schatzki ring, large hiatal hernia  . INGUNAL HERNIA REPAIR    . LOW ANTERIOR BOWEL RESECTION     NO ADJ CHEMO  . TUBAL LIGATION    . VEIN LIGATION AND STRIPPING       Allergies  Allergen Reactions  . Hydrocodone-Acetaminophen Hives  . Iohexol      Code: HIVES, Desc: pt. had a severe allergic reaction to IV contrast the last time she was injected and had to be seen in the ER.  Hives and sob.   . Nabumetone Rash  . Prednisone Rash      Family History  Problem Relation Age of Onset  . Breast cancer Mother   . Rheum arthritis Father    . Cancer - Lung Sister   . Brain cancer Sister   . Prostate cancer Brother   . Aneurysm Brother   . Colon cancer Neg Hx      Social History [name] [name] reports that she has never smoked. She has never used smokeless tobacco. [name] [name] reports that she drinks alcohol.   Review of Systems CONSTITUTIONAL: No weight loss, fever, chills, weakness or fatigue.  HEENT: Eyes: No visual loss, blurred vision, double vision or yellow sclerae.No hearing loss, sneezing, congestion, runny nose or sore throat.  SKIN: No rash or itching.  CARDIOVASCULAR: per HPI RESPIRATORY: occasional SOB GASTROINTESTINAL: No anorexia, nausea, vomiting or diarrhea. No abdominal pain or blood.  GENITOURINARY: No burning on urination, no polyuria NEUROLOGICAL: No headache, dizziness, syncope, paralysis, ataxia, numbness or tingling in the extremities. No change in bowel or bladder control.  MUSCULOSKELETAL: No muscle, back pain, joint pain or stiffness.  LYMPHATICS: No enlarged nodes. No history of splenectomy.  PSYCHIATRIC: No history of depression or anxiety.  ENDOCRINOLOGIC: No reports of sweating, cold or heat intolerance. No polyuria or polydipsia.  Marland Kitchen   Physical Examination Vitals:   02/06/16 0817  BP: 114/77  Pulse: 86   Vitals:   02/06/16 0817  Weight: 159 lb (72.1 kg)  Height: 5\' 3"  (1.6 m)    Gen: resting comfortably, no acute distress HEENT: no scleral icterus, pupils equal round and reactive, no palptable cervical adenopathy,  CV: RRR, 3/6 systolic murmur apex, no jvd Resp: Clear to auscultation bilaterally GI: abdomen is soft, non-tender, non-distended, normal bowel sounds, no hepatosplenomegaly MSK: extremities are warm, no edema.  Skin: warm, no rash Neuro:  no focal deficits Psych: appropriate affect   Diagnostic Studies  01/2016 echo Study Conclusions  - Left ventricle: The cavity size was normal. Wall thickness was   normal. Systolic function was normal. The estimated  ejection   fraction was in the range of 60% to 65%. Wall motion was normal;   there were no regional wall motion abnormalities. The study is   not technically sufficient to allow evaluation of LV diastolic   function. Doppler parameters are consistent with high ventricular   filling pressure. - Mitral valve: Calcified annulus. There was moderate to severe   (closer to severe) eccentric regurgitation. - Left atrium: The atrium was severely dilated. - Right atrium: The atrium was moderately dilated. - Tricuspid valve: There was moderate-severe regurgitation. - Pulmonary arteries: PA peak pressure: 40 mm Hg (S).   Assessment and Plan  1. Mitral regurgitation - moderate to severe by recent echo - she reports some SOB at [place]  -  we will obtain TEE to better evaluate MV and quantify degree of MR to see if could be related to symptoms    F/u 2 months   Arnoldo Lenis, M.D.

## 2016-02-06 NOTE — Patient Instructions (Signed)
Medication Instructions:  Continue all current medications.  Labwork: none  Testing/Procedures: Your physician has requested that you have a TEE. During a TEE, sound waves are used to create images of your heart. It provides your doctor with information about the size and shape of your heart and how well your heart's chambers and valves are working. In this test, a transducer is attached to the end of a flexible tube that's guided down your throat and into your esophagus (the tube leading from you mouth to your stomach) to get a more detailed image of your heart. You are not awake for the procedure. Please see the instruction sheet given to you today. For further information please visit [email or web address].  Follow-Up: 2 months   Any Other Special Instructions Will Be Listed Below (If Applicable).  If you need a refill on your cardiac medications before your next appointment, please call your pharmacy.

## 2016-02-07 ENCOUNTER — Ambulatory Visit: Payer: Medicare Other | Admitting: Cardiology

## 2016-02-08 ENCOUNTER — Telehealth: Payer: Self-pay | Admitting: Cardiology

## 2016-02-08 ENCOUNTER — Telehealth: Payer: Self-pay | Admitting: *Deleted

## 2016-02-08 ENCOUNTER — Encounter: Payer: Self-pay | Admitting: *Deleted

## 2016-02-08 NOTE — Telephone Encounter (Signed)
Checking percert for : TEE scheduled for Monday, October 30 at 10:00 am with Dr. [name] at [facility].

## 2016-02-08 NOTE — Telephone Encounter (Signed)
TEE scheduled for Monday, October 30 at 10:00 am with Dr. [name] at [facility].  Need to arrive at 8:30 am to short stay.  Patient given instructions for nothing to eat or drink after midnight except your medications with a sip of water.  Also, informed her to take her Xarelto like she normally would & have a responsible person to drive her home.  No prior labs needed.  Patient verbalized understanding.    Forwarded to [facility] Olegario Shearer) for pre-cert & Dr. [name] for him to do his orders.

## 2016-02-20 ENCOUNTER — Other Ambulatory Visit: Payer: Self-pay | Admitting: Cardiology

## 2016-02-20 ENCOUNTER — Encounter (HOSPITAL_COMMUNITY)
Admission: RE | Disposition: A | Discharge status: Home / Self Care | Payer: Self-pay | Source: Ambulatory Visit | Attending: Cardiology

## 2016-02-20 ENCOUNTER — Ambulatory Visit (HOSPITAL_COMMUNITY): Payer: Medicare Other

## 2016-02-20 ENCOUNTER — Ambulatory Visit (HOSPITAL_COMMUNITY)
Admission: RE | Admit: 2016-02-20 | Discharge: 2016-02-20 | Disposition: A | Discharge status: Home / Self Care | Payer: Medicare Other | Source: Ambulatory Visit | Attending: Cardiology | Admitting: Cardiology

## 2016-02-20 ENCOUNTER — Ambulatory Visit (HOSPITAL_BASED_OUTPATIENT_CLINIC_OR_DEPARTMENT_OTHER)
Admission: RE | Admit: 2016-02-20 | Discharge: 2016-02-20 | Disposition: A | Discharge status: Home / Self Care | Payer: Medicare Other | Source: Ambulatory Visit | Attending: Cardiology | Admitting: Cardiology

## 2016-02-20 ENCOUNTER — Encounter (HOSPITAL_COMMUNITY): Payer: Self-pay

## 2016-02-20 DIAGNOSIS — I059 Rheumatic mitral valve disease, unspecified: Secondary | ICD-10-CM

## 2016-02-20 DIAGNOSIS — I340 Nonrheumatic mitral (valve) insufficiency: Secondary | ICD-10-CM | POA: Insufficient documentation

## 2016-02-20 DIAGNOSIS — I499 Cardiac arrhythmia, unspecified: Secondary | ICD-10-CM | POA: Diagnosis not present

## 2016-02-20 DIAGNOSIS — K219 Gastro-esophageal reflux disease without esophagitis: Secondary | ICD-10-CM | POA: Diagnosis not present

## 2016-02-20 DIAGNOSIS — E7800 Pure hypercholesterolemia, unspecified: Secondary | ICD-10-CM | POA: Insufficient documentation

## 2016-02-20 DIAGNOSIS — Z79899 Other long term (current) drug therapy: Secondary | ICD-10-CM | POA: Diagnosis not present

## 2016-02-20 DIAGNOSIS — J45909 Unspecified asthma, uncomplicated: Secondary | ICD-10-CM | POA: Insufficient documentation

## 2016-02-20 DIAGNOSIS — Z7901 Long term (current) use of anticoagulants: Secondary | ICD-10-CM | POA: Insufficient documentation

## 2016-02-20 DIAGNOSIS — Z85038 Personal history of other malignant neoplasm of large intestine: Secondary | ICD-10-CM | POA: Insufficient documentation

## 2016-02-20 HISTORY — PX: TEE WITHOUT CARDIOVERSION: SHX5443

## 2016-02-20 SURGERY — ECHOCARDIOGRAM, TRANSESOPHAGEAL
Anesthesia: Moderate Sedation

## 2016-02-20 MED ORDER — FENTANYL CITRATE (PF) 100 MCG/2ML IJ SOLN
INTRAMUSCULAR | Status: DC
Start: 2016-02-20 — End: 2016-02-20
  Filled 2016-02-20: qty 2

## 2016-02-20 MED ORDER — FENTANYL CITRATE (PF) 100 MCG/2ML IJ SOLN
INTRAMUSCULAR | Status: DC | PRN
  Administered 2016-02-20: 50 ug via INTRAVENOUS
  Administered 2016-02-20: 25 ug via INTRAVENOUS

## 2016-02-20 MED ORDER — MIDAZOLAM HCL 5 MG/5ML IJ SOLN
INTRAMUSCULAR | Status: DC | PRN
  Administered 2016-02-20: 1 mg via INTRAVENOUS
  Administered 2016-02-20 (×2): 0.5 mg via INTRAVENOUS

## 2016-02-20 MED ORDER — MIDAZOLAM HCL 5 MG/5ML IJ SOLN
INTRAMUSCULAR | Status: AC
  Filled 2016-02-20: qty 10

## 2016-02-20 MED ORDER — SODIUM CHLORIDE 0.9 % IV SOLN
INTRAVENOUS | Status: DC
  Administered 2016-02-20: 09:00:00 via INTRAVENOUS

## 2016-02-20 MED ORDER — LIDOCAINE VISCOUS 2 % MT SOLN
OROMUCOSAL | Status: DC | PRN
  Administered 2016-02-20 (×2): 3 mL via OROMUCOSAL

## 2016-02-20 MED ORDER — LIDOCAINE VISCOUS 2 % MT SOLN
OROMUCOSAL | Status: AC
  Filled 2016-02-20: qty 15

## 2016-02-20 MED ORDER — BUTAMBEN-TETRACAINE-BENZOCAINE 2-2-14 % EX AERO
INHALATION_SPRAY | CUTANEOUS | Status: DC | PRN
  Administered 2016-02-20: 2 via TOPICAL

## 2016-02-20 MED ORDER — SODIUM CHLORIDE 0.9% FLUSH
INTRAVENOUS | Status: AC
  Filled 2016-02-20: qty 20

## 2016-02-20 MED ORDER — SODIUM CHLORIDE BACTERIOSTATIC 0.9 % IJ SOLN
INTRAMUSCULAR | Status: AC
  Filled 2016-02-20: qty 20

## 2016-02-20 NOTE — CV Procedure (Signed)
Procedure:TEE Attending: Dr Carlyle Dolly Indication: Mitral regurgitation   The patient was brought to the endoscopy suite after appropiate consent was obtained. The posterior oropharnyx was anesthesized with 2% lidocaine and cetacaine spray. Moderate sedation was achieved with 75 mcg of fentanyl and 2mg  of versed. A bite block was placed and the TEE probe was intubated into the esophagus without difficulty. Cardiopulmonary monitoring was conducted throughout procedure. Transgastric images limited probe did not advance well into the stomach  Prelim findings: eccentric moderate to severe MR. Moderate to severe TR. F/u full report for final findings.   [name] MD

## 2016-02-20 NOTE — Progress Notes (Signed)
*  PRELIMINARY RESULTS* Echocardiogram Echocardiogram Transesophageal has been performed in Endo.  [name] [name] 02/20/2016, 11:22 AM

## 2016-02-20 NOTE — Discharge Instructions (Signed)
Keep your appoint with Dr. [name] for follow up  Gastrointestinal Endoscopy, Care After Refer to this sheet in the next few weeks. These instructions provide you with information on caring for yourself after your procedure. Your caregiver may also give you more specific instructions. Your treatment has been planned according to current medical practices, but problems sometimes occur. Call your caregiver if you have any problems or questions after your procedure. HOME CARE INSTRUCTIONS  If you were given medicine to help you relax (sedative), do not drive, operate machinery, or sign important documents for 24 hours.  Avoid alcohol and hot or warm beverages for the first 24 hours after the procedure.  Only take over-the-counter or prescription medicines for pain, discomfort, or fever as directed by your caregiver. You may resume taking your normal medicines unless your caregiver tells you otherwise. Ask your caregiver when you may resume taking medicines that may cause bleeding, such as aspirin, clopidogrel, or warfarin.  You may return to your normal diet and activities on the day after your procedure, or as directed by your caregiver. Walking may help to reduce any bloated feeling in your abdomen.  Drink enough fluids to keep your urine clear or pale yellow.  You may gargle with salt water if you have a sore throat. SEEK IMMEDIATE MEDICAL CARE IF:  You have severe nausea or vomiting.  You have severe abdominal pain, abdominal cramps that last longer than 6 hours, or abdominal swelling (distention).  You have severe shoulder or back pain.  You have trouble swallowing.  You have shortness of breath, your breathing is shallow, or you are breathing faster than normal.  You have a fever or a rapid heartbeat.  You vomit blood or material that looks like coffee grounds.  You have bloody, black, or tarry stools. MAKE SURE YOU:  Understand these instructions.  Will watch your  condition.  Will get help right away if you are not doing well or get worse.   This information is not intended to replace advice given to you by your health care provider. Make sure you discuss any questions you have with your health care provider.   Document Released: 11/22/2003 Document Revised: 04/30/2014 Document Reviewed: 07/10/2011 Elsevier Interactive Patient Education [employer].

## 2016-02-21 NOTE — H&P (Signed)
Patient presents for TEE to further evaluate mitral regurgitation. Please refer to recent clinic note posted below for full medical history.   [name] MD           Clinical Summary Ms. Schlagel is a 75 y.o.female seen today for follow up of the following medical problems. This is a focused visit on her history of mitral regurgitation.    1. Mitral regurgitation - echo at last [facility] Jan 2017 showed normal LVEF, 3+(modarte)eccentric MR with immobile posterior leaflet.  - can have some SOB that's variable, often varies with weather. Some days DOE <1 block, other days can go multiple blocks. Can have some occasional LE edema, she assoicates more with varicose vein. Occasional orthopnea.   - repeat echo 01/2016 moderate to severe MR. LVEF 60-65%. LVIDs 31. Symptoms unchanged since last visit.         Past Medical History:  Diagnosis Date  . Asthma   . Colon adenoma   . Colon cancer ([place])    status post low anterior resection, limited stage disease requiring no adjuvant therapy  . Diverticulosis   . DM (dermatomyositis)   . Dysrhythmia   . Esophageal dysphagia   . GERD (gastroesophageal reflux disease)   . Hematuria   . Hemorrhoids   . Hiatal hernia   . Hypercholesterolemia   . PONV (postoperative nausea and vomiting)   . Schatzki's ring           Allergies  Allergen Reactions  . Hydrocodone-Acetaminophen Hives  . Iohexol      Code: HIVES, Desc: pt. had a severe allergic reaction to IV contrast the last time she was injected and had to be seen in the ER.  Hives and sob.  . Nabumetone Rash  . Prednisone Rash           Current Outpatient Prescriptions  Medication Sig Dispense Refill  . acetaminophen (TYLENOL) 325 MG tablet Take 650 mg by mouth every 6 (six) hours as needed for pain.    Marland Kitchen albuterol (PROVENTIL HFA;VENTOLIN HFA) 108 (90 BASE) MCG/ACT inhaler Inhale 2 puffs into the lungs every 6 (six) hours as needed for  wheezing.    Marland Kitchen ALPRAZolam (XANAX) 0.5 MG tablet Take 0.5 mg by mouth 2 (two) times daily as needed. Anxiety/sleep.    . calcium carbonate (OS-CAL) 600 MG TABS tablet Take 600 mg by mouth 2 (two) times daily with a meal.    . Coenzyme Q10 100 MG TABS Take 1 tablet by mouth daily.    . metoprolol succinate (TOPROL-XL) 25 MG 24 hr tablet Take 1 tablet (25 mg total) by mouth daily. 90 tablet 3  . Multiple Vitamin (MULTIVITAMIN) tablet Take 1 tablet by mouth daily.      Marland Kitchen omeprazole (PRILOSEC) 20 MG capsule Take 1 capsule (20 mg total) by mouth daily. 30 capsule 3  . PARoxetine (PAXIL) 20 MG tablet Take 20 mg by mouth daily.     . rivaroxaban (XARELTO) 20 MG TABS tablet Take 1 tablet (20 mg total) by mouth daily with supper. 28 tablet 0  . simvastatin (ZOCOR) 40 MG tablet Take 40 mg by mouth daily.    . traMADol (ULTRAM) 50 MG tablet Take 50 mg by mouth 2 (two) times daily.     No current facility-administered medications for this visit.           Past Surgical History:  Procedure Laterality Date  . APPENDECTOMY    . COLONOSCOPY  11/09   Dr.  Rourk- external hemorrhoidal tags o/w normal rectal mucosa, s/p surgical resection with a normal appearing anastomosis 12cm, pan colonic diverticulum  . COLONOSCOPY N/A 06/02/2012   [id number] post low anterior resection. Pancolonic diverticulosis. Colonic polyp-tubular adenoma. Surveillance due 2019.   Marland Kitchen COLONOSCOPY N/A 10/13/2015   Procedure: COLONOSCOPY;  Surgeon: Daneil Dolin, MD;  Location: [name];  Service: Endoscopy;  Laterality: N/A;  0930  . ESOPHAGOGASTRODUODENOSCOPY  07/2007   Dr. Daiva Nakayama cervical esophageal web, schatzki ring, large hiatal hernia  . INGUNAL HERNIA REPAIR    . LOW ANTERIOR BOWEL RESECTION     NO ADJ CHEMO  . TUBAL LIGATION    . VEIN LIGATION AND STRIPPING            Allergies  Allergen Reactions  . Hydrocodone-Acetaminophen Hives  . Iohexol      Code: HIVES,  Desc: pt. had a severe allergic reaction to IV contrast the last time she was injected and had to be seen in the ER.  Hives and sob.  . Nabumetone Rash  . Prednisone Rash               Family History  Problem Relation Age of Onset  . Breast cancer Mother   . Rheum arthritis Father   . Cancer - Lung Sister   . Brain cancer Sister   . Prostate cancer Brother   . Aneurysm Brother   . Colon cancer Neg Hx      Social History Ms. Speirs reports that she has never smoked. She has never used smokeless tobacco. Ms. Minion reports that she drinks alcohol.   Review of Systems CONSTITUTIONAL: No weight loss, fever, chills, weakness or fatigue.  HEENT: Eyes: No visual loss, blurred vision, double vision or yellow sclerae.No hearing loss, sneezing, congestion, runny nose or sore throat.  SKIN: No rash or itching.  CARDIOVASCULAR: per HPI RESPIRATORY: occasional SOB GASTROINTESTINAL: No anorexia, nausea, vomiting or diarrhea. No abdominal pain or blood.  GENITOURINARY: No burning on urination, no polyuria NEUROLOGICAL: No headache, dizziness, syncope, paralysis, ataxia, numbness or tingling in the extremities. No change in bowel or bladder control.  MUSCULOSKELETAL: No muscle, back pain, joint pain or stiffness.  LYMPHATICS: No enlarged nodes. No history of splenectomy.  PSYCHIATRIC: No history of depression or anxiety.  ENDOCRINOLOGIC: No reports of sweating, cold or heat intolerance. No polyuria or polydipsia.  Marland Kitchen   Physical Examination    Vitals:   02/06/16 0817  BP: 114/77  Pulse: 86      Vitals:   02/06/16 0817  Weight: 159 lb (72.1 kg)  Height: 5\' 3"  (1.6 m)    Gen: resting comfortably, no acute distress HEENT: no scleral icterus, pupils equal round and reactive, no palptable cervical adenopathy,  CV: RRR, 3/6 systolic murmur apex, no jvd Resp: Clear to auscultation bilaterally GI: abdomen is soft, non-tender, non-distended, normal bowel  sounds, no hepatosplenomegaly MSK: extremities are warm, no edema.  Skin: warm, no rash Neuro:  no focal deficits Psych: appropriate affect   Diagnostic Studies  01/2016 echo Study Conclusions  - Left ventricle: The cavity size was normal. Wall thickness was normal. Systolic function was normal. The estimated ejection fraction was in the range of 60% to 65%. Wall motion was normal; there were no regional wall motion abnormalities. The study is not technically sufficient to allow evaluation of LV diastolic function. Doppler parameters are consistent with high ventricular filling pressure. - Mitral valve: Calcified annulus. There was moderate to severe (closer to severe) eccentric  regurgitation. - Left atrium: The atrium was severely dilated. - Right atrium: The atrium was moderately dilated. - Tricuspid valve: There was moderate-severe regurgitation. - Pulmonary arteries: PA peak pressure: 40 mm Hg (S).   Assessment and Plan  1. Mitral regurgitation - moderate to severe by recent echo - she reports some SOB at [place]  - we will obtain TEE to better evaluate MV and quantify degree of MR to see if could be related to symptoms    F/u 2 months   Arnoldo Lenis, M.D.

## 2016-02-22 ENCOUNTER — Encounter (HOSPITAL_COMMUNITY): Payer: Self-pay | Admitting: Cardiology

## 2016-02-23 ENCOUNTER — Telehealth: Payer: Self-pay | Admitting: *Deleted

## 2016-02-23 NOTE — Telephone Encounter (Signed)
-----   Message from Arnoldo Lenis, MD sent at 02/23/2016  1:57 PM EDT ----- That's ok  JB ----- Message ----- From: Massie Maroon, CMA Sent: 02/23/2016   1:34 PM To: Arnoldo Lenis, MD  She is scheduled for 12/18 is this ok or move to Jan?  [name] [name] ----- Message ----- From: Arnoldo Lenis, MD Sent: 02/23/2016   1:29 PM To: [name] [name] [name] [name], CMA  TEE shows tricuspid valve is severely leaky, mitral valve is moderate to severely leaky. We will continue to closely monitor at this time. She needs to see me back in 2 months  [name] MD

## 2016-02-23 NOTE — Telephone Encounter (Signed)
Pt aware - 12/18 OV already scheduled

## 2016-02-24 DIAGNOSIS — Z23 Encounter for immunization: Secondary | ICD-10-CM | POA: Diagnosis not present

## 2016-03-12 ENCOUNTER — Ambulatory Visit: Payer: Medicare Other | Admitting: Nurse Practitioner

## 2016-03-22 ENCOUNTER — Ambulatory Visit (INDEPENDENT_AMBULATORY_CARE_PROVIDER_SITE_OTHER): Payer: Medicare Other | Admitting: Nurse Practitioner

## 2016-03-22 ENCOUNTER — Encounter: Payer: Self-pay | Admitting: Nurse Practitioner

## 2016-03-22 VITALS — BP 135/84 | HR 90 | Temp 97.9°F | Ht 62.0 in | Wt 158.2 lb

## 2016-03-22 DIAGNOSIS — R1030 Lower abdominal pain, unspecified: Secondary | ICD-10-CM

## 2016-03-22 DIAGNOSIS — K625 Hemorrhage of anus and rectum: Secondary | ICD-10-CM | POA: Diagnosis not present

## 2016-03-22 DIAGNOSIS — K5900 Constipation, unspecified: Secondary | ICD-10-CM

## 2016-03-22 NOTE — Progress Notes (Signed)
cc'ed to pcp °

## 2016-03-22 NOTE — Patient Instructions (Signed)
1. Try to remember to take Colace every day. 2. Increase water and fiber intake. 3. Take MiraLAX as needed for intermittent worsening of your constipation despite daily Colace. 4. Return for follow-up in 3 months.

## 2016-03-22 NOTE — Assessment & Plan Note (Addendum)
Continued rectal bleeding due to poorly controlled constipation. Patient is not following instructions. She is also taking Xarelto for A. fib making her more prone to bleed as well. Colonoscopy this year is reassuring. Bleeding is intermittent and typically scant. Further recommendations for her constipation as per below.  She isn't sure if Preparation H is effective. I offered Anusol rectal cream which she declined at this time.

## 2016-03-22 NOTE — Progress Notes (Signed)
Referring Provider: Glenda Chroman, MD Primary Care Physician:  Glenda Chroman, MD Primary GI:  Dr. Gala Romney  Chief Complaint  Patient presents with  . Rectal Bleeding    ocassionally for a day or so    HPI:   [name] [name] is a 75 y.o. female who presents for follow-up on rectal bleeding. The patient was last seen in our office 12/16/2015 for the same. Had previously recently undergone colonoscopy on 10/13/2015 with moderate diverticulosis, single 5 mm polyp in the descending colon, otherwise normal. Polyp found to be serrated polyp/adenoma without dysplasia and recommended 1 more colonoscopy in 3 years if health permits. At her last office visit she was doing well, some spots of toilet tissue hematochezia about 1-2 days in duration than resolving for multiple weeks. Still on Xarelto, has not tried topical hemorrhoidal therapy. Occasional constipation for which she takes Colace 1 needed. Occasional heartburn, Rolaids help. She was given Prilosec daily, recommended Preparation H or Anusol prescription if Preparation H not effective, continue Colace stool softener but take it every day and use MiraLAX as needed. Return for follow-up in 3 months.  Today she states she's doing ok. Cannot always remember to take Colace daily. Subsequently she continues to have intermittent constipation when she doesn't remember to take it. Discussed ways to help her remember. Has intermittent abdominal pain, intermittent hemorrhoid bleeding on Xarelto with constipation. She has AFib. Denies N/V, melena, unintentional weight loss, fever, chills. Has a lot of stomach "rumbling." Denies chest pain, dyspnea, dizziness, lightheadedness, syncope, near syncope. Denies any other upper or lower GI symptoms.  Past Medical History:  Diagnosis Date  . Asthma   . Colon adenoma   . Colon cancer ([place])    status post low anterior resection, limited stage disease requiring no adjuvant therapy  . Diverticulosis   . DM  (dermatomyositis)   . Dysrhythmia   . Esophageal dysphagia   . GERD (gastroesophageal reflux disease)   . Hematuria   . Hemorrhoids   . Hiatal hernia   . Hypercholesterolemia   . PONV (postoperative nausea and vomiting)   . Schatzki's ring     Past Surgical History:  Procedure Laterality Date  . APPENDECTOMY    . COLONOSCOPY  11/09   Dr. Gala Romney- external hemorrhoidal tags o/w normal rectal mucosa, s/p surgical resection with a normal appearing anastomosis 12cm, pan colonic diverticulum  . COLONOSCOPY N/A 06/02/2012   [id number] post low anterior resection. Pancolonic diverticulosis. Colonic polyp-tubular adenoma. Surveillance due 2019.   Marland Kitchen COLONOSCOPY N/A 10/13/2015   Procedure: COLONOSCOPY;  Surgeon: Daneil Dolin, MD;  Location: [name];  Service: Endoscopy;  Laterality: N/A;  0930  . ESOPHAGOGASTRODUODENOSCOPY  07/2007   Dr. Daiva Nakayama cervical esophageal web, schatzki ring, large hiatal hernia  . INGUNAL HERNIA REPAIR    . LOW ANTERIOR BOWEL RESECTION     NO ADJ CHEMO  . TEE WITHOUT CARDIOVERSION N/A 02/20/2016   Procedure: TRANSESOPHAGEAL ECHOCARDIOGRAM (TEE);  Surgeon: Arnoldo Lenis, MD;  Location: [name];  Service: Endoscopy;  Laterality: N/A;  . TUBAL LIGATION    . VEIN LIGATION AND STRIPPING      Current Outpatient Prescriptions  Medication Sig Dispense Refill  . acetaminophen (TYLENOL) 325 MG tablet Take 650 mg by mouth every 6 (six) hours as needed for pain.    Marland Kitchen albuterol (PROVENTIL HFA;VENTOLIN HFA) 108 (90 BASE) MCG/ACT inhaler Inhale 2 puffs into the lungs every 6 (six) hours as needed for wheezing.    Marland Kitchen  ALPRAZolam (XANAX) 0.5 MG tablet Take 0.5 mg by mouth 2 (two) times daily as needed (for anxiety/sleep (scheduled at bedtime)).     . calcium carbonate (OS-CAL) 600 MG TABS tablet Take 600 mg by mouth 2 (two) times daily with a meal.    . Coenzyme Q10 100 MG TABS Take 100 mg by mouth every evening.     . metoprolol succinate (TOPROL-XL) 25 MG 24  hr tablet Take 1 tablet (25 mg total) by mouth daily. 90 tablet 3  . Multiple Vitamin (MULTIVITAMIN) tablet Take 1 tablet by mouth daily.      . NON FORMULARY Stool softner    . omeprazole (PRILOSEC) 20 MG capsule Take 1 capsule (20 mg total) by mouth daily. 30 capsule 3  . PARoxetine (PAXIL) 20 MG tablet Take 20 mg by mouth daily.     . rivaroxaban (XARELTO) 20 MG TABS tablet Take 1 tablet (20 mg total) by mouth daily with supper. 28 tablet 0  . simvastatin (ZOCOR) 40 MG tablet Take 40 mg by mouth at bedtime.     . traMADol (ULTRAM) 50 MG tablet Take 50 mg by mouth every 12 (twelve) hours as needed for moderate pain.     Vladimir Faster Glycol-Propyl Glycol (SYSTANE ULTRA) 0.4-0.3 % SOLN Place 1 drop into both eyes 3 (three) times daily.     No current facility-administered medications for this visit.     Allergies as of 03/22/2016 - Review Complete 03/22/2016  Allergen Reaction Noted  . Hydrocodone-acetaminophen Hives 04/26/2010  . Iohexol  07/16/2008  . Nabumetone Rash 04/26/2010  . Prednisone Rash 10/13/2015    Family History  Problem Relation Age of Onset  . Breast cancer Mother   . Rheum arthritis Father   . Cancer - Lung Sister   . Brain cancer Sister   . Prostate cancer Brother   . Aneurysm Brother   . Colon cancer Neg Hx     Social History   Social History  . Marital status: Married    Spouse name: N/A  . Number of children: N/A  . Years of education: N/A   Social History Main Topics  . Smoking status: Never Smoker  . Smokeless tobacco: Never Used  . Alcohol use 0.0 oz/week     Comment: Rarely  . Drug use: No  . Sexual activity: Not Asked   Other Topics Concern  . None   Social History Narrative  . None    Review of Systems: General: Negative for anorexia, weight loss, fever, chills, fatigue, weakness. ENT: Negative for hoarseness, difficulty swallowing. CV: Negative for chest pain, angina, palpitations, peripheral edema.  Respiratory: Negative for  dyspnea at rest, cough, sputum, wheezing.  GI: See history of present illness. Endo: Negative for unusual weight change.  Heme: Negative for bruising or bleeding.   Physical Exam: BP 135/84   Pulse 90   Temp 97.9 F (36.6 C) (Oral)   Ht 5\' 2"  (1.575 m)   Wt 158 lb 3.2 oz (71.8 kg)   BMI 28.94 kg/m  General:   Alert and oriented. Pleasant and cooperative. Well-nourished and well-developed.  Ears:  Normal auditory acuity. Cardiovascular:  S1, S2 present without murmurs appreciated. Extremities without clubbing or edema. Respiratory:  Clear to auscultation bilaterally. No wheezes, rales, or rhonchi. No distress.  Gastrointestinal:  +BS, soft, non-tender and non-distended. No HSM noted. No guarding or rebound. No masses appreciated.  Rectal:  Deferred  Musculoskalatal:  Symmetrical without gross deformities.  Neurologic:  Alert and oriented  x4;  grossly normal neurologically. Psych:  Alert and cooperative. Normal mood and affect. Heme/Lymph/Immune: No excessive bruising noted.    03/22/2016 10:10 AM   Disclaimer: This note was dictated with voice recognition software. Similar sounding words can inadvertently be transcribed and may not be corrected upon review.

## 2016-03-22 NOTE — Assessment & Plan Note (Signed)
Abdominal pain likely due to constipation. Further constipation management as per above. Return for follow-up in 3 months.

## 2016-03-22 NOTE — Assessment & Plan Note (Signed)
The patient has continued constipation and subsequent rectal bleeding likely due to hemorrhoids. Recent colonoscopy this year reassuring. It is not worth changing her to a prescription option as her main issue is she cannot remember to take Colace regularly. Discussed strategies to help her remember. Recommend she take Colace once a day, increase water and fiber, MiraLAX as needed for intermittent worsening of constipation. Return for follow-up in 3 months.

## 2016-04-09 ENCOUNTER — Telehealth: Payer: Self-pay | Admitting: Cardiology

## 2016-04-09 ENCOUNTER — Encounter: Payer: Self-pay | Admitting: *Deleted

## 2016-04-09 ENCOUNTER — Ambulatory Visit (INDEPENDENT_AMBULATORY_CARE_PROVIDER_SITE_OTHER): Payer: Medicare Other | Admitting: Cardiology

## 2016-04-09 ENCOUNTER — Encounter: Payer: Self-pay | Admitting: Cardiology

## 2016-04-09 VITALS — BP 122/79 | HR 89 | Ht 63.0 in | Wt 158.0 lb

## 2016-04-09 DIAGNOSIS — I340 Nonrheumatic mitral (valve) insufficiency: Secondary | ICD-10-CM

## 2016-04-09 DIAGNOSIS — R0609 Other forms of dyspnea: Secondary | ICD-10-CM

## 2016-04-09 MED ORDER — RANITIDINE HCL 150 MG PO TABS: ORAL_TABLET | ORAL | 0 refills | Status: DC

## 2016-04-09 MED ORDER — PREDNISONE 20 MG PO TABS: ORAL_TABLET | ORAL | 0 refills | Status: DC

## 2016-04-09 MED ORDER — METOPROLOL SUCCINATE ER 50 MG PO TB24: 50.0000 mg | ORAL_TABLET | Freq: Every day | ORAL | 3 refills | Status: DC

## 2016-04-09 MED ORDER — DIPHENHYDRAMINE HCL 25 MG PO TABS: ORAL_TABLET | ORAL | 0 refills | Status: DC

## 2016-04-09 NOTE — Telephone Encounter (Signed)
No precert required 

## 2016-04-09 NOTE — Progress Notes (Addendum)
Clinical Summary [name] [name] is a 75 y.o.female seen today for follow up of the following medical problems. This is a focused visit on her history of mitral regurgitation. For more detailed history please refer to prior clinic note.   1. Mitral regurgitation - echo at last [facility] Jan 2017 showed normal LVEF, 3+(modarte)eccentric MR with immobile posterior leaflet.  - can have some SOB that's variable, often varies with weather. Some days DOE <1 block, other days can go multiple blocks. Can have some occasional LE edema, she assoicates more with varicose vein. Occasional orthopnea.   - repeat echo 01/2016 moderate to severe MR. LVEF 60-65%. LVIDs 31. Symptoms unchanged since last visit.  - 01/2016 TEE moderate to severe eccentric MR, severe TR.  - increased SOB over the last few weeks. Example sweeping floor unable to complete 1 room.      Past Medical History:  Diagnosis Date  . Asthma   . Colon adenoma   . Colon cancer ([place])    status post low anterior resection, limited stage disease requiring no adjuvant therapy  . Diverticulosis   . DM (dermatomyositis)   . Dysrhythmia   . Esophageal dysphagia   . GERD (gastroesophageal reflux disease)   . Hematuria   . Hemorrhoids   . Hiatal hernia   . Hypercholesterolemia   . PONV (postoperative nausea and vomiting)   . Schatzki's ring      Allergies  Allergen Reactions  . Hydrocodone-Acetaminophen Hives  . Iohexol      Code: HIVES, Desc: pt. had a severe allergic reaction to IV contrast the last time she was injected and had to be seen in the ER.  Hives and sob.   . Nabumetone Rash  . Prednisone Rash     Current Outpatient Prescriptions  Medication Sig Dispense Refill  . acetaminophen (TYLENOL) 325 MG tablet Take 650 mg by mouth every 6 (six) hours as needed for pain.    Marland Kitchen albuterol (PROVENTIL HFA;VENTOLIN HFA) 108 (90 BASE) MCG/ACT inhaler Inhale 2 puffs into the lungs every 6 (six) hours as needed for wheezing.      Marland Kitchen ALPRAZolam (XANAX) 0.5 MG tablet Take 0.5 mg by mouth 2 (two) times daily as needed (for anxiety/sleep (scheduled at bedtime)).     . calcium carbonate (OS-CAL) 600 MG TABS tablet Take 600 mg by mouth 2 (two) times daily with a meal.    . Coenzyme Q10 100 MG TABS Take 100 mg by mouth every evening.     . metoprolol succinate (TOPROL-XL) 25 MG 24 hr tablet Take 1 tablet (25 mg total) by mouth daily. 90 tablet 3  . Multiple Vitamin (MULTIVITAMIN) tablet Take 1 tablet by mouth daily.      . NON FORMULARY Stool softner    . omeprazole (PRILOSEC) 20 MG capsule Take 1 capsule (20 mg total) by mouth daily. 30 capsule 3  . PARoxetine (PAXIL) 20 MG tablet Take 20 mg by mouth daily.     Vladimir Faster Glycol-Propyl Glycol (SYSTANE ULTRA) 0.4-0.3 % SOLN Place 1 drop into both eyes 3 (three) times daily.    . rivaroxaban (XARELTO) 20 MG TABS tablet Take 1 tablet (20 mg total) by mouth daily with supper. 28 tablet 0  . simvastatin (ZOCOR) 40 MG tablet Take 40 mg by mouth at bedtime.     . traMADol (ULTRAM) 50 MG tablet Take 50 mg by mouth every 12 (twelve) hours as needed for moderate pain.      No current  facility-administered medications for this visit.      Past Surgical History:  Procedure Laterality Date  . APPENDECTOMY    . COLONOSCOPY  11/09   Dr. Gala Romney- external hemorrhoidal tags o/w normal rectal mucosa, s/p surgical resection with a normal appearing anastomosis 12cm, pan colonic diverticulum  . COLONOSCOPY N/A 06/02/2012   [id number] post low anterior resection. Pancolonic diverticulosis. Colonic polyp-tubular adenoma. Surveillance due 2019.   Marland Kitchen COLONOSCOPY N/A 10/13/2015   Procedure: COLONOSCOPY;  Surgeon: Daneil Dolin, MD;  [name];  Service: Endoscopy;  Laterality: N/A;  0930  . ESOPHAGOGASTRODUODENOSCOPY  07/2007   Dr. Daiva Nakayama cervical esophageal web, schatzki ring, large hiatal hernia  . INGUNAL HERNIA REPAIR    . LOW ANTERIOR BOWEL RESECTION     NO ADJ CHEMO   . TEE WITHOUT CARDIOVERSION N/A 02/20/2016   Procedure: TRANSESOPHAGEAL ECHOCARDIOGRAM (TEE);  Surgeon: Arnoldo Lenis, MD;  [name];  Service: Endoscopy;  Laterality: N/A;  . TUBAL LIGATION    . VEIN LIGATION AND STRIPPING       Allergies  Allergen Reactions  . Hydrocodone-Acetaminophen Hives  . Iohexol      Code: HIVES, Desc: pt. had a severe allergic reaction to IV contrast the last time she was injected and had to be seen in the ER.  Hives and sob.   . Nabumetone Rash  . Prednisone Rash      Family History  Problem Relation Age of Onset  . Breast cancer Mother   . Rheum arthritis Father   . Cancer - Lung Sister   . Brain cancer Sister   . Prostate cancer Brother   . Aneurysm Brother   . Colon cancer Neg Hx      Social History [name] reports that she has never smoked. She has never used smokeless tobacco. [name] reports that she drinks alcohol.   Review of Systems CONSTITUTIONAL: No weight loss, fever, chills, weakness or fatigue.  HEENT: Eyes: No visual loss, blurred vision, double vision or yellow sclerae.No hearing loss, sneezing, congestion, runny nose or sore throat.  SKIN: No rash or itching.  CARDIOVASCULAR: per hpi RESPIRATORY: per hpi  GASTROINTESTINAL: No anorexia, nausea, vomiting or diarrhea. No abdominal pain or blood.  GENITOURINARY: No burning on urination, no polyuria NEUROLOGICAL: No headache, dizziness, syncope, paralysis, ataxia, numbness or tingling in the extremities. No change in bowel or bladder control.  MUSCULOSKELETAL: No muscle, back pain, joint pain or stiffness.  LYMPHATICS: No enlarged nodes. No history of splenectomy.  PSYCHIATRIC: No history of depression or anxiety.  ENDOCRINOLOGIC: No reports of sweating, cold or heat intolerance. No polyuria or polydipsia.  Marland Kitchen   Physical Examination Vitals:   04/09/16 0949  BP: 122/79  Pulse: 89   Vitals:   04/09/16 0949  Weight: 158 lb (71.7 kg)   Height: 5\' 3"  (1.6 m)    Gen: resting comfortably, no acute distress HEENT: no scleral icterus, pupils equal round and reactive, no palptable cervical adenopathy,  CV: RRR, 3/6 systolic murmur apex, no jvd Resp: Clear to auscultation bilaterally GI: abdomen is soft, non-tender, non-distended, normal bowel sounds, no hepatosplenomegaly MSK: extremities are warm, no edema.  Skin: warm, no rash Neuro:  no focal deficits Psych: appropriate affect   Diagnostic Studies 01/2016 echo Study Conclusions  - Left ventricle: The cavity size was normal. Wall thickness was   normal. Systolic function was normal. The estimated ejection   fraction was in the range of 60% to 65%. Wall motion  was normal;   there were no regional wall motion abnormalities. The study is   not technically sufficient to allow evaluation of LV diastolic   function. Doppler parameters are consistent with high ventricular   filling pressure. - Mitral valve: Calcified annulus. There was moderate to severe   (closer to severe) eccentric regurgitation. - Left atrium: The atrium was severely dilated. - Right atrium: The atrium was moderately dilated. - Tricuspid valve: There was moderate-severe regurgitation. - Pulmonary arteries: PA peak pressure: 40 mm Hg (S).   01/2016 TEE eccentric moderate to severe MR. Moderate to severe TR. F/u full report for final findings.    Assessment and Plan   1. Mitral regurgitation - moderate to severe by recent TTE and TEE, very eccentric jet. SIgnificant TR - progression SOB/DOE. - we will refer for RHC/LHC, after completed refer to Dr Roxy Manns to consider surgical options    I have reviewed the risks, indications, and alternatives to cardiac catheterization, possible angioplasty, and stenting with the patient. Risks include but are not limited to bleeding, infection, vascular injury, stroke, myocardial infection, arrhythmia, kidney injury, radiation-related injury in the case of  prolonged fluoroscopy use, emergency cardiac surgery, and death. The patient understands the risks of serious complication is 1-2 in [id number] with diagnostic cardiac cath and 1-2% or less with angioplasty/stenting.   Arnoldo Lenis, M.D

## 2016-04-09 NOTE — Telephone Encounter (Signed)
Left & right heart cath - Thursday, 04/12/2016 at 9:00 am - Dr. Ellyn Hack

## 2016-04-09 NOTE — Patient Instructions (Signed)
Medication Instructions:   Increase Toprol XL to 50mg  daily.  Continue all other medications.    Labwork: none  Testing/Procedures: Your physician has requested that you have a cardiac catheterization. Cardiac catheterization is used to diagnose and/or treat various heart conditions. Doctors may recommend this procedure for a number of different reasons. The most common reason is to evaluate chest pain. Chest pain can be a symptom of coronary artery disease (CAD), and cardiac catheterization can show whether plaque is narrowing or blocking your heart's arteries. This procedure is also used to evaluate the valves, as well as measure the blood flow and oxygen levels in different parts of your heart. For further information please visit [email or web address]. Please follow instruction sheet, as given.  Referrals:  [facility] - Dr. Roxy Manns  Follow-Up: 1 month   Any Other Special Instructions Will Be Listed Below (If Applicable).  If you need a refill on your cardiac medications before your next appointment, please call your pharmacy.

## 2016-04-11 ENCOUNTER — Other Ambulatory Visit: Payer: Self-pay | Admitting: Cardiology

## 2016-04-11 DIAGNOSIS — I340 Nonrheumatic mitral (valve) insufficiency: Secondary | ICD-10-CM

## 2016-04-12 ENCOUNTER — Encounter (HOSPITAL_COMMUNITY)
Admission: RE | Disposition: A | Discharge status: Home / Self Care | Payer: Self-pay | Source: Ambulatory Visit | Attending: Cardiology

## 2016-04-12 ENCOUNTER — Ambulatory Visit (HOSPITAL_COMMUNITY)
Admission: RE | Admit: 2016-04-12 | Discharge: 2016-04-12 | Disposition: A | Discharge status: Home / Self Care | Payer: Medicare Other | Source: Ambulatory Visit | Attending: Cardiology | Admitting: Cardiology

## 2016-04-12 ENCOUNTER — Encounter (HOSPITAL_COMMUNITY): Payer: Self-pay | Admitting: Cardiology

## 2016-04-12 DIAGNOSIS — I2510 Atherosclerotic heart disease of native coronary artery without angina pectoris: Secondary | ICD-10-CM | POA: Insufficient documentation

## 2016-04-12 DIAGNOSIS — Z801 Family history of malignant neoplasm of trachea, bronchus and lung: Secondary | ICD-10-CM | POA: Diagnosis not present

## 2016-04-12 DIAGNOSIS — Z01810 Encounter for preprocedural cardiovascular examination: Secondary | ICD-10-CM | POA: Diagnosis not present

## 2016-04-12 DIAGNOSIS — Z808 Family history of malignant neoplasm of other organs or systems: Secondary | ICD-10-CM | POA: Diagnosis not present

## 2016-04-12 DIAGNOSIS — K219 Gastro-esophageal reflux disease without esophagitis: Secondary | ICD-10-CM | POA: Insufficient documentation

## 2016-04-12 DIAGNOSIS — M3313 Other dermatomyositis without myopathy: Secondary | ICD-10-CM | POA: Diagnosis not present

## 2016-04-12 DIAGNOSIS — I340 Nonrheumatic mitral (valve) insufficiency: Secondary | ICD-10-CM | POA: Diagnosis not present

## 2016-04-12 DIAGNOSIS — Z85038 Personal history of other malignant neoplasm of large intestine: Secondary | ICD-10-CM | POA: Diagnosis not present

## 2016-04-12 DIAGNOSIS — J45909 Unspecified asthma, uncomplicated: Secondary | ICD-10-CM | POA: Diagnosis not present

## 2016-04-12 DIAGNOSIS — E7800 Pure hypercholesterolemia, unspecified: Secondary | ICD-10-CM | POA: Diagnosis not present

## 2016-04-12 DIAGNOSIS — Z7901 Long term (current) use of anticoagulants: Secondary | ICD-10-CM | POA: Insufficient documentation

## 2016-04-12 DIAGNOSIS — I272 Pulmonary hypertension, unspecified: Secondary | ICD-10-CM | POA: Diagnosis not present

## 2016-04-12 DIAGNOSIS — Z803 Family history of malignant neoplasm of breast: Secondary | ICD-10-CM | POA: Diagnosis not present

## 2016-04-12 DIAGNOSIS — Z8719 Personal history of other diseases of the digestive system: Secondary | ICD-10-CM | POA: Insufficient documentation

## 2016-04-12 HISTORY — PX: CARDIAC CATHETERIZATION: SHX172

## 2016-04-12 LAB — CBC
HEMATOCRIT: 37.7 % (ref 36.0–46.0)
Hemoglobin: 11.9 g/dL — ABNORMAL LOW (ref 12.0–15.0)
MCH: 29.8 pg (ref 26.0–34.0)
MCHC: 31.6 g/dL (ref 30.0–36.0)
MCV: 94.5 fL (ref 78.0–100.0)
Platelets: 253 10*3/uL (ref 150–400)
RBC: 3.99 MIL/uL (ref 3.87–5.11)
RDW: 13.2 % (ref 11.5–15.5)
WBC: 6.3 10*3/uL (ref 4.0–10.5)

## 2016-04-12 LAB — POCT I-STAT 3, VENOUS BLOOD GAS (G3P V)
ACID-BASE EXCESS: 2 mmol/L (ref 0.0–2.0)
Bicarbonate: 28 mmol/L (ref 20.0–28.0)
Bicarbonate: 26.4 mmol/L (ref 20.0–28.0)
O2 Saturation: 68 %
O2 Saturation: 65 %
PCO2 VEN: 50.9 mmHg (ref 44.0–60.0)
PH VEN: 7.346 (ref 7.250–7.430)
PH VEN: 7.323 (ref 7.250–7.430)
PO2 VEN: 38 mmHg (ref 32.0–45.0)
TCO2: 30 mmol/L (ref 0–100)
TCO2: 28 mmol/L (ref 0–100)
pCO2, Ven: 51.2 mmHg (ref 44.0–60.0)
pO2, Ven: 37 mmHg (ref 32.0–45.0)

## 2016-04-12 LAB — BASIC METABOLIC PANEL
Anion gap: 8 (ref 5–15)
BUN: 13 mg/dL (ref 6–20)
CHLORIDE: 106 mmol/L (ref 101–111)
CO2: 26 mmol/L (ref 22–32)
Calcium: 9 mg/dL (ref 8.9–10.3)
Creatinine, Ser: 0.85 mg/dL (ref 0.44–1.00)
GFR calc Af Amer: >60 mL/min (ref >60)
GFR calc non Af Amer: >60 mL/min (ref >60)
GLUCOSE: 129 mg/dL — AB (ref 65–99)
POTASSIUM: 3.7 mmol/L (ref 3.5–5.1)
Sodium: 140 mmol/L (ref 135–145)

## 2016-04-12 LAB — POCT I-STAT 3, ART BLOOD GAS (G3+)
ACID-BASE EXCESS: 2 mmol/L (ref 0.0–2.0)
Bicarbonate: 28.1 mmol/L — ABNORMAL HIGH (ref 20.0–28.0)
O2 SAT: 98 %
PCO2 ART: 50.1 mmHg — AB (ref 32.0–48.0)
TCO2: 30 mmol/L (ref 0–100)
pH, Arterial: 7.356 (ref 7.350–7.450)
pO2, Arterial: 122 mmHg — ABNORMAL HIGH (ref 83.0–108.0)

## 2016-04-12 LAB — PROTIME-INR
INR: 1.05
Prothrombin Time: 13.8 seconds (ref 11.4–15.2)

## 2016-04-12 SURGERY — RIGHT/LEFT HEART CATH AND CORONARY ANGIOGRAPHY
Anesthesia: LOCAL

## 2016-04-12 MED ORDER — IOPAMIDOL (ISOVUE-370) INJECTION 76%
INTRAVENOUS | Status: AC
  Filled 2016-04-12: qty 100

## 2016-04-12 MED ORDER — HEPARIN SODIUM (PORCINE) 1000 UNIT/ML IJ SOLN
INTRAMUSCULAR | Status: AC
  Filled 2016-04-12: qty 1

## 2016-04-12 MED ORDER — SODIUM CHLORIDE 0.9 % IV SOLN
250.0000 mL | INTRAVENOUS | Status: DC | PRN
Start: 2016-04-12 — End: 2016-04-12

## 2016-04-12 MED ORDER — SODIUM CHLORIDE 0.9 % WEIGHT BASED INFUSION
3.0000 mL/kg/h | INTRAVENOUS | Status: DC
  Administered 2016-04-12: 3 mL/kg/h via INTRAVENOUS

## 2016-04-12 MED ORDER — IOPAMIDOL (ISOVUE-370) INJECTION 76%
INTRAVENOUS | Status: DC | PRN
  Administered 2016-04-12: 70 mL via INTRA_ARTERIAL

## 2016-04-12 MED ORDER — HEPARIN SODIUM (PORCINE) 1000 UNIT/ML IJ SOLN
INTRAMUSCULAR | Status: DC | PRN
  Administered 2016-04-12: 4000 [IU] via INTRAVENOUS

## 2016-04-12 MED ORDER — ASPIRIN 81 MG PO CHEW
81.0000 mg | CHEWABLE_TABLET | ORAL | Status: AC
  Administered 2016-04-12: 81 mg via ORAL

## 2016-04-12 MED ORDER — LIDOCAINE HCL (PF) 1 % IJ SOLN
INTRAMUSCULAR | Status: AC
  Filled 2016-04-12: qty 30

## 2016-04-12 MED ORDER — HEPARIN (PORCINE) IN NACL 2-0.9 UNIT/ML-% IJ SOLN
INTRAMUSCULAR | Status: AC
  Filled 2016-04-12: qty 1000

## 2016-04-12 MED ORDER — SODIUM CHLORIDE 0.9 % WEIGHT BASED INFUSION: 1.0000 mL/kg/h | INTRAVENOUS | Status: DC

## 2016-04-12 MED ORDER — LIDOCAINE HCL (PF) 1 % IJ SOLN
INTRAMUSCULAR | Status: DC | PRN
  Administered 2016-04-12: 3 mL via INTRADERMAL

## 2016-04-12 MED ORDER — FENTANYL CITRATE (PF) 100 MCG/2ML IJ SOLN
INTRAMUSCULAR | Status: AC
  Filled 2016-04-12: qty 2

## 2016-04-12 MED ORDER — MIDAZOLAM HCL 2 MG/2ML IJ SOLN
INTRAMUSCULAR | Status: AC
  Filled 2016-04-12: qty 2

## 2016-04-12 MED ORDER — SODIUM CHLORIDE 0.9 % IV SOLN: 250.0000 mL | INTRAVENOUS | Status: DC | PRN

## 2016-04-12 MED ORDER — ONDANSETRON HCL 4 MG/2ML IJ SOLN: 4.0000 mg | Freq: Four times a day (QID) | INTRAMUSCULAR | Status: DC | PRN

## 2016-04-12 MED ORDER — SODIUM CHLORIDE 0.9% FLUSH: 3.0000 mL | Freq: Two times a day (BID) | INTRAVENOUS | Status: DC

## 2016-04-12 MED ORDER — FENTANYL CITRATE (PF) 100 MCG/2ML IJ SOLN
INTRAMUSCULAR | Status: DC | PRN
  Administered 2016-04-12: 25 ug via INTRAVENOUS

## 2016-04-12 MED ORDER — HEPARIN (PORCINE) IN NACL 2-0.9 UNIT/ML-% IJ SOLN
INTRAMUSCULAR | Status: DC | PRN
  Administered 2016-04-12: 1000 mL via INTRA_ARTERIAL

## 2016-04-12 MED ORDER — VERAPAMIL HCL 2.5 MG/ML IV SOLN
INTRAVENOUS | Status: AC
  Filled 2016-04-12: qty 2

## 2016-04-12 MED ORDER — SODIUM CHLORIDE 0.9% FLUSH: 3.0000 mL | INTRAVENOUS | Status: DC | PRN

## 2016-04-12 MED ORDER — MIDAZOLAM HCL 2 MG/2ML IJ SOLN
INTRAMUSCULAR | Status: DC | PRN
  Administered 2016-04-12: 1 mg via INTRAVENOUS

## 2016-04-12 MED ORDER — ASPIRIN 81 MG PO CHEW
CHEWABLE_TABLET | ORAL | Status: AC
Start: 2016-04-12 — End: 2016-04-12
  Administered 2016-04-12: 81 mg via ORAL
  Filled 2016-04-12: qty 1

## 2016-04-12 SURGICAL SUPPLY — 11 items
CATH BALLN WEDGE 5F 110CM (CATHETERS) ×2 IMPLANT
CATH INFINITI 5FR ANG PIGTAIL (CATHETERS) ×2 IMPLANT
CATH OPTITORQUE TIG 4.0 5F (CATHETERS) ×2 IMPLANT
GLIDESHEATH SLEND A-KIT 6F 22G (SHEATH) ×2 IMPLANT
GUIDEWIRE INQWIRE 1.5J.035X260 (WIRE) ×1 IMPLANT
INQWIRE 1.5J .035X260CM (WIRE) ×2
KIT HEART LEFT (KITS) ×2 IMPLANT
PACK CARDIAC CATHETERIZATION (CUSTOM PROCEDURE TRAY) ×2 IMPLANT
SHEATH FAST CATH BRACH 5F 5CM (SHEATH) ×2 IMPLANT
TRANSDUCER W/STOPCOCK (MISCELLANEOUS) ×4 IMPLANT
TUBING CIL FLEX 10 FLL-RA (TUBING) ×2 IMPLANT

## 2016-04-12 NOTE — Interval H&P Note (Signed)
History and Physical Interval Note:  04/12/2016 8:50 AM  [name] [name]  has presented today for surgery, with the diagnosis of mitral regurgitation  The various methods of treatment have been discussed with the patient and family. After consideration of risks, benefits and other options for treatment, the patient has consented to  Procedure(s): Right/Left Heart Cath and Coronary Angiography (N/A) as a surgical intervention .  The patient's history has been reviewed, patient examined, no change in status, stable for surgery.  I have reviewed the patient's chart and labs.  Questions were answered to the patient's satisfaction.     Glenetta Hew

## 2016-04-12 NOTE — H&P (View-Only) (Signed)
Clinical Summary Ms. Clary is a 75 y.o.female seen today for follow up of the following medical problems. This is a focused visit on her history of mitral regurgitation. For more detailed history please refer to prior clinic note.   1. Mitral regurgitation - echo at last [facility] Jan 2017 showed normal LVEF, 3+(modarte)eccentric MR with immobile posterior leaflet.  - can have some SOB that's variable, often varies with weather. Some days DOE <1 block, other days can go multiple blocks. Can have some occasional LE edema, she assoicates more with varicose vein. Occasional orthopnea.   - repeat echo 01/2016 moderate to severe MR. LVEF 60-65%. LVIDs 31. Symptoms unchanged since last visit.  - 01/2016 TEE moderate to severe eccentric MR, severe TR.  - increased SOB over the last few weeks. Example sweeping floor unable to complete 1 room.      Past Medical History:  Diagnosis Date  . Asthma   . Colon adenoma   . Colon cancer ([place])    status post low anterior resection, limited stage disease requiring no adjuvant therapy  . Diverticulosis   . DM (dermatomyositis)   . Dysrhythmia   . Esophageal dysphagia   . GERD (gastroesophageal reflux disease)   . Hematuria   . Hemorrhoids   . Hiatal hernia   . Hypercholesterolemia   . PONV (postoperative nausea and vomiting)   . Schatzki's ring      Allergies  Allergen Reactions  . Hydrocodone-Acetaminophen Hives  . Iohexol      Code: HIVES, Desc: pt. had a severe allergic reaction to IV contrast the last time she was injected and had to be seen in the ER.  Hives and sob.   . Nabumetone Rash  . Prednisone Rash     Current Outpatient Prescriptions  Medication Sig Dispense Refill  . acetaminophen (TYLENOL) 325 MG tablet Take 650 mg by mouth every 6 (six) hours as needed for pain.    Marland Kitchen albuterol (PROVENTIL HFA;VENTOLIN HFA) 108 (90 BASE) MCG/ACT inhaler Inhale 2 puffs into the lungs every 6 (six) hours as needed for wheezing.      Marland Kitchen ALPRAZolam (XANAX) 0.5 MG tablet Take 0.5 mg by mouth 2 (two) times daily as needed (for anxiety/sleep (scheduled at bedtime)).     . calcium carbonate (OS-CAL) 600 MG TABS tablet Take 600 mg by mouth 2 (two) times daily with a meal.    . Coenzyme Q10 100 MG TABS Take 100 mg by mouth every evening.     . metoprolol succinate (TOPROL-XL) 25 MG 24 hr tablet Take 1 tablet (25 mg total) by mouth daily. 90 tablet 3  . Multiple Vitamin (MULTIVITAMIN) tablet Take 1 tablet by mouth daily.      . NON FORMULARY Stool softner    . omeprazole (PRILOSEC) 20 MG capsule Take 1 capsule (20 mg total) by mouth daily. 30 capsule 3  . PARoxetine (PAXIL) 20 MG tablet Take 20 mg by mouth daily.     Vladimir Faster Glycol-Propyl Glycol (SYSTANE ULTRA) 0.4-0.3 % SOLN Place 1 drop into both eyes 3 (three) times daily.    . rivaroxaban (XARELTO) 20 MG TABS tablet Take 1 tablet (20 mg total) by mouth daily with supper. 28 tablet 0  . simvastatin (ZOCOR) 40 MG tablet Take 40 mg by mouth at bedtime.     . traMADol (ULTRAM) 50 MG tablet Take 50 mg by mouth every 12 (twelve) hours as needed for moderate pain.      No current  facility-administered medications for this visit.      Past Surgical History:  Procedure Laterality Date  . APPENDECTOMY    . COLONOSCOPY  11/09   Dr. Gala Romney- external hemorrhoidal tags o/w normal rectal mucosa, s/p surgical resection with a normal appearing anastomosis 12cm, pan colonic diverticulum  . COLONOSCOPY N/A 06/02/2012   [id number] post low anterior resection. Pancolonic diverticulosis. Colonic polyp-tubular adenoma. Surveillance due 2019.   Marland Kitchen COLONOSCOPY N/A 10/13/2015   Procedure: COLONOSCOPY;  Surgeon: Daneil Dolin, MD;  Location: [name];  Service: Endoscopy;  Laterality: N/A;  0930  . ESOPHAGOGASTRODUODENOSCOPY  07/2007   Dr. Daiva Nakayama cervical esophageal web, schatzki ring, large hiatal hernia  . INGUNAL HERNIA REPAIR    . LOW ANTERIOR BOWEL RESECTION     NO ADJ CHEMO   . TEE WITHOUT CARDIOVERSION N/A 02/20/2016   Procedure: TRANSESOPHAGEAL ECHOCARDIOGRAM (TEE);  Surgeon: Arnoldo Lenis, MD;  Location: [name];  Service: Endoscopy;  Laterality: N/A;  . TUBAL LIGATION    . VEIN LIGATION AND STRIPPING       Allergies  Allergen Reactions  . Hydrocodone-Acetaminophen Hives  . Iohexol      Code: HIVES, Desc: pt. had a severe allergic reaction to IV contrast the last time she was injected and had to be seen in the ER.  Hives and sob.   . Nabumetone Rash  . Prednisone Rash      Family History  Problem Relation Age of Onset  . Breast cancer Mother   . Rheum arthritis Father   . Cancer - Lung Sister   . Brain cancer Sister   . Prostate cancer Brother   . Aneurysm Brother   . Colon cancer Neg Hx      Social History Ms. Turano reports that she has never smoked. She has never used smokeless tobacco. Ms. Otterman reports that she drinks alcohol.   Review of Systems CONSTITUTIONAL: No weight loss, fever, chills, weakness or fatigue.  HEENT: Eyes: No visual loss, blurred vision, double vision or yellow sclerae.No hearing loss, sneezing, congestion, runny nose or sore throat.  SKIN: No rash or itching.  CARDIOVASCULAR: per hpi RESPIRATORY: per hpi  GASTROINTESTINAL: No anorexia, nausea, vomiting or diarrhea. No abdominal pain or blood.  GENITOURINARY: No burning on urination, no polyuria NEUROLOGICAL: No headache, dizziness, syncope, paralysis, ataxia, numbness or tingling in the extremities. No change in bowel or bladder control.  MUSCULOSKELETAL: No muscle, back pain, joint pain or stiffness.  LYMPHATICS: No enlarged nodes. No history of splenectomy.  PSYCHIATRIC: No history of depression or anxiety.  ENDOCRINOLOGIC: No reports of sweating, cold or heat intolerance. No polyuria or polydipsia.  Marland Kitchen   Physical Examination Vitals:   04/09/16 0949  BP: 122/79  Pulse: 89   Vitals:   04/09/16 0949  Weight: 158 lb (71.7 kg)   Height: 5\' 3"  (1.6 m)    Gen: resting comfortably, no acute distress HEENT: no scleral icterus, pupils equal round and reactive, no palptable cervical adenopathy,  CV: RRR, 3/6 systolic murmur apex, no jvd Resp: Clear to auscultation bilaterally GI: abdomen is soft, non-tender, non-distended, normal bowel sounds, no hepatosplenomegaly MSK: extremities are warm, no edema.  Skin: warm, no rash Neuro:  no focal deficits Psych: appropriate affect   Diagnostic Studies 01/2016 echo Study Conclusions  - Left ventricle: The cavity size was normal. Wall thickness was   normal. Systolic function was normal. The estimated ejection   fraction was in the range of 60% to 65%. Wall motion  was normal;   there were no regional wall motion abnormalities. The study is   not technically sufficient to allow evaluation of LV diastolic   function. Doppler parameters are consistent with high ventricular   filling pressure. - Mitral valve: Calcified annulus. There was moderate to severe   (closer to severe) eccentric regurgitation. - Left atrium: The atrium was severely dilated. - Right atrium: The atrium was moderately dilated. - Tricuspid valve: There was moderate-severe regurgitation. - Pulmonary arteries: PA peak pressure: 40 mm Hg (S).   01/2016 TEE eccentric moderate to severe MR. Moderate to severe TR. F/u full report for final findings.    Assessment and Plan   1. Mitral regurgitation - moderate to severe by recent TTE and TEE, very eccentric jet. SIgnificant TR - progression SOB/DOE. - we will refer for RHC/LHC, after completed refer to Dr Roxy Manns to consider surgical options    I have reviewed the risks, indications, and alternatives to cardiac catheterization, possible angioplasty, and stenting with the patient. Risks include but are not limited to bleeding, infection, vascular injury, stroke, myocardial infection, arrhythmia, kidney injury, radiation-related injury in the case of  prolonged fluoroscopy use, emergency cardiac surgery, and death. The patient understands the risks of serious complication is 1-2 in [id number] with diagnostic cardiac cath and 1-2% or less with angioplasty/stenting.   Arnoldo Lenis, M.D

## 2016-04-12 NOTE — Discharge Instructions (Signed)

## 2016-04-12 NOTE — Progress Notes (Signed)
Site area: right wrist  Site Prior to Removal:  Level 0  Pressure Applied For 15 MINUTES    Minutes Beginning at 1015  Manual:  yes  Patient Status During Pull:  good  Post Pull wrist site 0  Post Pull Instructions Given:  yes  Post Pull Pulses Present:yes  Dressing Applied: yes  Comments:  Pt. Tol. Procedure well

## 2016-04-13 DIAGNOSIS — I4891 Unspecified atrial fibrillation: Secondary | ICD-10-CM | POA: Diagnosis not present

## 2016-04-13 DIAGNOSIS — M159 Polyosteoarthritis, unspecified: Secondary | ICD-10-CM | POA: Diagnosis not present

## 2016-04-13 DIAGNOSIS — E7800 Pure hypercholesterolemia, unspecified: Secondary | ICD-10-CM | POA: Diagnosis not present

## 2016-04-13 DIAGNOSIS — J449 Chronic obstructive pulmonary disease, unspecified: Secondary | ICD-10-CM | POA: Diagnosis not present

## 2016-04-18 MED FILL — Verapamil HCl IV Soln 2.5 MG/ML: INTRAVENOUS | Qty: 2 | Status: AC

## 2016-04-19 DIAGNOSIS — H40013 Open angle with borderline findings, low risk, bilateral: Secondary | ICD-10-CM | POA: Diagnosis not present

## 2016-04-20 DIAGNOSIS — F419 Anxiety disorder, unspecified: Secondary | ICD-10-CM | POA: Diagnosis not present

## 2016-04-20 DIAGNOSIS — Z6823 Body mass index (BMI) 23.0-23.9, adult: Secondary | ICD-10-CM | POA: Diagnosis not present

## 2016-04-20 DIAGNOSIS — G4700 Insomnia, unspecified: Secondary | ICD-10-CM | POA: Diagnosis not present

## 2016-04-20 DIAGNOSIS — J449 Chronic obstructive pulmonary disease, unspecified: Secondary | ICD-10-CM | POA: Diagnosis not present

## 2016-04-20 DIAGNOSIS — Z299 Encounter for prophylactic measures, unspecified: Secondary | ICD-10-CM | POA: Diagnosis not present

## 2016-04-27 ENCOUNTER — Encounter: Payer: Self-pay | Admitting: Thoracic Surgery (Cardiothoracic Vascular Surgery)

## 2016-04-27 ENCOUNTER — Institutional Professional Consult (permissible substitution) (INDEPENDENT_AMBULATORY_CARE_PROVIDER_SITE_OTHER): Payer: Medicare Other | Admitting: Thoracic Surgery (Cardiothoracic Vascular Surgery)

## 2016-04-27 ENCOUNTER — Other Ambulatory Visit: Payer: Self-pay | Admitting: *Deleted

## 2016-04-27 VITALS — BP 130/87 | HR 95 | Resp 18 | Ht 63.0 in | Wt 155.0 lb

## 2016-04-27 DIAGNOSIS — R0602 Shortness of breath: Secondary | ICD-10-CM | POA: Diagnosis not present

## 2016-04-27 DIAGNOSIS — I71019 Dissection of thoracic aorta, unspecified: Secondary | ICD-10-CM

## 2016-04-27 DIAGNOSIS — I481 Persistent atrial fibrillation: Secondary | ICD-10-CM | POA: Diagnosis not present

## 2016-04-27 DIAGNOSIS — I340 Nonrheumatic mitral (valve) insufficiency: Secondary | ICD-10-CM | POA: Diagnosis not present

## 2016-04-27 DIAGNOSIS — I361 Nonrheumatic tricuspid (valve) insufficiency: Secondary | ICD-10-CM | POA: Diagnosis not present

## 2016-04-27 DIAGNOSIS — R0600 Dyspnea, unspecified: Secondary | ICD-10-CM

## 2016-04-27 DIAGNOSIS — I071 Rheumatic tricuspid insufficiency: Secondary | ICD-10-CM | POA: Insufficient documentation

## 2016-04-27 DIAGNOSIS — I7409 Other arterial embolism and thrombosis of abdominal aorta: Secondary | ICD-10-CM

## 2016-04-27 DIAGNOSIS — Z01818 Encounter for other preprocedural examination: Secondary | ICD-10-CM

## 2016-04-27 DIAGNOSIS — I7101 Dissection of thoracic aorta: Secondary | ICD-10-CM

## 2016-04-27 DIAGNOSIS — I4819 Other persistent atrial fibrillation: Secondary | ICD-10-CM | POA: Insufficient documentation

## 2016-04-27 NOTE — Progress Notes (Addendum)
[address]       [place] [address]             [phone]     CARDIOTHORACIC SURGERY CONSULTATION REPORT  Referring Provider is Branch, Alphonse Guild, MD PCP is Glenda Chroman, MD  Chief Complaint  Patient presents with  . Mitral Regurgitation    SEVERE...ECHO 01/25/16, CATH 04/12/16    HPI:  Patient is a 76 year old female with mitral regurgitation, atrial fibrillation on chronic anticoagulation, chronic diastolic congestive heart failure, COPD, hyperlipidemia, remote history of colon cancer, and more recent history of mild intermittent rectal bleeding attributed to hemorrhoids who has been referred for surgical consultation to discuss treatment options for management of severe mitral regurgitation.  The patient states that she has a long history of symptoms of exertional shortness breath dating back many years. She is a nonsmoker who has been diagnosed with COPD, although she was exposed to secondhand smoke by her husband who smokes a lot. Approximately 3 years ago she was noted to have an irregular heart rhythm and diagnosed with atrial fibrillation. She underwent an echocardiogram and was found to have mitral regurgitation. She was started on Xarelto and followed for several years by a cardiologist with [facility].  Her cardiologist moved away and she was referred to Dr. [name] in September 2017 because of worsening symptoms of shortness of breath.  At that time she was noted to be in rate controlled persistent atrial fibrillation.  It is unclear how long it has been since she was in sinus rhythm, but she has never been treated with antiarrhythmic agents nor had DC cardioversion performed.  Follow-up echocardiogram performed 01/25/2016 revealed normal left ventricular systolic function with an eccentric jet of mitral regurgitation that was reported as moderate to severe with associated severe left atrial enlargement and moderate to severe tricuspid regurgitation.  She  subsequently underwent TEE on 02/20/2016 that revealed mitral valve prolapse with a very eccentric jet of regurgitation and moderate to severe mitral regurgitation with normal left ventricular systolic function, ejection fraction estimated 60-65%, severe left atrial enlargement, and severe tricuspid regurgitation.  Left and right heart catheterization was performed 04/12/2016 and revealed mild nonobstructive coronary artery disease with mild pulmonary hypertension and severe (4+) mitral regurgitation.  The patient was referred for elective surgical consultation.  The patient is married and lives with her husband in [place], [place].  She has been retired for nearly 20 years, having previously worked in the [employer]. She lives a sedentary lifestyle. She states that she does not exercise at all. She complains of progressive symptoms of exertional shortness breath and fatigue. She has had some degree of shortness of breath for many years, and over the past 3-6 months her symptoms have gotten much worse. She now gets short of breath with mild activity and this limits her daily activities considerably.  She denies any recent history of resting shortness of breath. She occasionally gets short of breath at night but typically she can sleep on one pillow. She has had some mild dizzy spells and lower extremity edema. She has not had any exertional chest pain but she does report some tightness across her chest when she is short of breath. She has intermittent dry cough and frequent wheezing. This is been attributed to history of asthma. She has mild arthritis but no problems with ambulation. She states that her physical activities are limited primarily by shortness of breath. She has not seen a dentist in  at least 3 or 4 years. She denies any loose teeth or painful teeth at present. She does not have dentures.   Past Medical History:  Diagnosis Date  . Asthma   . Atrial fibrillation, persistent ([place])   . Colon  adenoma   . Colon cancer ([place])    status post low anterior resection, limited stage disease requiring no adjuvant therapy  . Diverticulosis   . DM (dermatomyositis)   . Dysrhythmia   . Esophageal dysphagia   . GERD (gastroesophageal reflux disease)   . Hematuria   . Hemorrhoids   . Hiatal hernia   . Hypercholesterolemia   . Mitral regurgitation   . PONV (postoperative nausea and vomiting)   . Schatzki's ring   . Tricuspid regurgitation     Past Surgical History:  Procedure Laterality Date  . APPENDECTOMY    . CARDIAC CATHETERIZATION N/A 04/12/2016   Procedure: Right/Left Heart Cath and Coronary Angiography;  Surgeon: Leonie Man, MD;  Location: [facility];  Service: Cardiovascular;  Laterality: N/A;  . COLONOSCOPY  11/09   Dr. Gala Romney- external hemorrhoidal tags o/w normal rectal mucosa, s/p surgical resection with a normal appearing anastomosis 12cm, pan colonic diverticulum  . COLONOSCOPY N/A 06/02/2012   LFY:BOFBPZ post low anterior resection. Pancolonic diverticulosis. Colonic polyp-tubular adenoma. Surveillance due 2019.   Marland Kitchen COLONOSCOPY N/A 10/13/2015   Procedure: COLONOSCOPY;  Surgeon: Daneil Dolin, MD;  [name];  Service: Endoscopy;  Laterality: N/A;  0930  . ESOPHAGOGASTRODUODENOSCOPY  07/2007   Dr. Daiva Nakayama cervical esophageal web, schatzki ring, large hiatal hernia  . INGUNAL HERNIA REPAIR    . LOW ANTERIOR BOWEL RESECTION     NO ADJ CHEMO  . TEE WITHOUT CARDIOVERSION N/A 02/20/2016   Procedure: TRANSESOPHAGEAL ECHOCARDIOGRAM (TEE);  Surgeon: Arnoldo Lenis, MD;  Location[name];  Service: Endoscopy;  Laterality: N/A;  . TUBAL LIGATION    . VEIN LIGATION AND STRIPPING      Family History  Problem Relation Age of Onset  . Breast cancer Mother   . Rheum arthritis Father   . Cancer - Lung Sister   . Brain cancer Sister   . Prostate cancer Brother   . Aneurysm Brother   . Colon cancer Neg Hx     Social History    Social History  . Marital status: Married    Spouse name: N/A  . Number of children: N/A  . Years of education: N/A   Occupational History  . Not on file.   Social History Main Topics  . Smoking status: Never Smoker  . Smokeless tobacco: Never Used  . Alcohol use 0.0 oz/week     Comment: Rarely  . Drug use: No  . Sexual activity: Not on file   Other Topics Concern  . Not on file   Social History Narrative  . No narrative on file    Current Outpatient Prescriptions  Medication Sig Dispense Refill  . acetaminophen (TYLENOL) 325 MG tablet Take 650 mg by mouth every 6 (six) hours as needed for pain.    Marland Kitchen albuterol (PROVENTIL HFA;VENTOLIN HFA) 108 (90 BASE) MCG/ACT inhaler Inhale 2 puffs into the lungs every 6 (six) hours as needed for wheezing.    Marland Kitchen ALPRAZolam (XANAX) 0.5 MG tablet Take 0.5 mg by mouth 2 (two) times daily as needed (for anxiety/sleep (scheduled at bedtime)).     . calcium carbonate (OS-CAL) 600 MG TABS tablet Take 600 mg by mouth 2 (two) times daily  with a meal.    . Coenzyme Q10 100 MG TABS Take 100 mg by mouth every evening.     . docusate sodium (COLACE) 100 MG capsule Take 100-200 mg by mouth daily as needed for mild constipation.    . metoprolol succinate (TOPROL-XL) 50 MG 24 hr tablet Take 1 tablet (50 mg total) by mouth daily. 90 tablet 3  . Multiple Vitamin (MULTIVITAMIN) tablet Take 1 tablet by mouth daily.      Marland Kitchen omeprazole (PRILOSEC) 20 MG capsule Take 1 capsule (20 mg total) by mouth daily. 30 capsule 3  . PARoxetine (PAXIL) 20 MG tablet Take 20 mg by mouth daily.     Vladimir Faster Glycol-Propyl Glycol (SYSTANE ULTRA) 0.4-0.3 % SOLN Place 1 drop into both eyes 3 (three) times daily.    . ranitidine (ZANTAC) 150 MG tablet Take one tab ('150mg'$ ) at 6:00 pm the evening prior to heart cath, one tab at midnight, and take the remaining tab with you to the hospital the morning of. 3 tablet 0  . rivaroxaban (XARELTO) 20 MG TABS tablet Take 1 tablet (20 mg total)  by mouth daily with supper. (Patient taking differently: Take 20 mg by mouth daily with supper. Has stopped for procedure) 28 tablet 0  . simvastatin (ZOCOR) 40 MG tablet Take 40 mg by mouth at bedtime.     . sodium chloride (OCEAN) 0.65 % SOLN nasal spray Place 1 spray into both nostrils as needed for congestion.    . traMADol (ULTRAM) 50 MG tablet Take 50 mg by mouth every 12 (twelve) hours as needed for moderate pain.      No current facility-administered medications for this visit.     Allergies  Allergen Reactions  . Hydrocodone-Acetaminophen Hives  . Iohexol      Code: HIVES, Desc: pt. had a severe allergic reaction to IV contrast the last time she was injected and had to be seen in the ER.  Hives and sob.   . Nabumetone Rash  . Prednisone Rash      Review of Systems:   General:  normal appetite, decreased energy, no weight gain, no weight loss, no fever  Cardiac:  no chest pain with exertion, no chest pain at rest, + SOB with exertion, occasional resting SOB, + occasional PND, no orthopnea, no palpitations, + arrhythmia, + atrial fibrillation, + LE edema, + dizzy spells, no syncope  Respiratory:  + shortness of breath, no home oxygen, no productive cough, + dry cough, no bronchitis, + wheezing, no hemoptysis, + asthma, no pain with inspiration or cough, no sleep apnea, no CPAP at night  GI:   mild difficulty swallowing, + reflux, no frequent heartburn, + hiatal hernia, no abdominal pain, no constipation, no diarrhea, + hematochezia, no hematemesis, no melena  GU:   no dysuria,  no frequency, no urinary tract infection, no hematuria, no kidney stones, no kidney disease  Vascular:  no pain suggestive of claudication, no pain in feet, + leg cramps, + varicose veins, no DVT, no non-healing foot ulcer  Neuro:   no stroke, no TIA's, no seizures, + headaches, no temporary blindness one eye,  no slurred speech, no peripheral neuropathy, no chronic pain, no instability of gait, mild  memory/cognitive dysfunction  Musculoskeletal: mild arthritis, mild joint swelling, no myalgias, no difficulty walking, normal mobility   Skin:   no rash, no itching, no skin infections, no pressure sores or ulcerations  Psych:   + anxiety, + depression, + nervousness, no unusual recent  stress  Eyes:   no blurry vision, + floaters, no recent vision changes, + wears glasses or contacts  ENT:   no hearing loss, no loose or painful teeth, no dentures, last saw dentist 3 or 4 years ago  Hematologic:  + easy bruising, no abnormal bleeding, no clotting disorder, no frequent epistaxis  Endocrine:  no diabetes, does not check CBG's at home     Physical Exam:   BP 130/87 (BP Location: Right Arm, Patient Position: Sitting, Cuff Size: Large)   Pulse 95   Resp 18   Ht '5\' 3"'$  (1.6 m)   Wt 155 lb (70.3 kg)   SpO2 98% Comment: ON RA  BMI 27.46 kg/m   General:  Moderately obese female NAD  HEENT:  Unremarkable   Neck:   no JVD, no bruits, no adenopathy   Chest:   clear to auscultation, symmetrical breath sounds, no wheezes, no rhonchi   CV:   Irregular rate and rhythm, grade III/VI systolic murmur best LLSB  Abdomen:  soft, non-tender, no masses   Extremities:  warm, well-perfused, pulses not palpable, trace LE edema, + varicose veins  Rectal/GU  Deferred  Neuro:   Grossly non-focal and symmetrical throughout  Skin:   Clean and dry, no rashes, no breakdown, + mild changes venous insufficiency both lower legs   Diagnostic Tests:  Transthoracic Echocardiography  Patient:    Joanell, Cressler MR #:       [id number] Study Date: 01/25/2016 Gender:     F Age:        17 Height:     160 cm Weight:     71.7 kg BSA:        1.81 m^2 Pt. Status: Room:   SONOGRAPHER  [facility]  ATTENDING    Kerry Hough, M.D.  Berna Spare, M.D.  REFERRING    Kerry Hough, M.D.  PERFORMING   [facility],  [facility]  cc:  ------------------------------------------------------------------- LV EF: 60% -   65%  ------------------------------------------------------------------- History:   PMH:   Murmur.  Atrial fibrillation.  Mitral valve disease.  Risk factors:  Diabetes mellitus. Dyslipidemia.  ------------------------------------------------------------------- Study Conclusions  - Left ventricle: The cavity size was normal. Wall thickness was   normal. Systolic function was normal. The estimated ejection   fraction was in the range of 60% to 65%. Wall motion was normal;   there were no regional wall motion abnormalities. The study is   not technically sufficient to allow evaluation of LV diastolic   function. Doppler parameters are consistent with high ventricular   filling pressure. - Mitral valve: Calcified annulus. There was moderate to severe   (closer to severe) eccentric regurgitation. - Left atrium: The atrium was severely dilated. - Right atrium: The atrium was moderately dilated. - Tricuspid valve: There was moderate-severe regurgitation. - Pulmonary arteries: PA peak pressure: 40 mm Hg (S).  ------------------------------------------------------------------- Labs, prior tests, procedures, and surgery: Echocardiography (January 2017).    The mitral valve showed moderate to severe regurgitation.  EF was 65%.  ------------------------------------------------------------------- Study data:  The previous study was not available, so comparison was made to the report of January 2017.  Study status:  Routine. Procedure:  Transthoracic echocardiography. Image quality was adequate.  Study completion:  There were no complications. Transthoracic echocardiography.  M-mode, complete 2D, spectral Doppler, and color Doppler.  Birthdate:  Patient birthdate: 1940-07-23.  Age:  Patient is 76 yr old.  Sex:  Gender: female. BMI: 28 kg/m^2.  Blood pressure:  106/70  Patient  status: Outpatient.  Study date:  Study date: 01/25/2016. Study time: 09:48 AM.  -------------------------------------------------------------------  ------------------------------------------------------------------- Left ventricle:  The cavity size was normal. Wall thickness was normal. Systolic function was normal. The estimated ejection fraction was in the range of 60% to 65%. Wall motion was normal; there were no regional wall motion abnormalities. The study is not technically sufficient to allow evaluation of LV diastolic function. Doppler parameters are consistent with high ventricular filling pressure.  ------------------------------------------------------------------- Aortic valve:   Trileaflet.  Doppler:   There was no stenosis. There was no regurgitation.  ------------------------------------------------------------------- Aorta:  Aortic root: The aortic root was normal in size. Ascending aorta: The ascending aorta was normal in size.  ------------------------------------------------------------------- Mitral valve:   Calcified annulus.  Doppler:  There was moderate to severe (closer to severe) eccentric regurgitation.    Peak gradient (D): 10 mm Hg.  ------------------------------------------------------------------- Left atrium:  The atrium was severely dilated.  ------------------------------------------------------------------- Atrial septum:  No defect or patent foramen ovale was identified.   ------------------------------------------------------------------- Right ventricle:  The cavity size was normal. Wall thickness was normal. Systolic function was normal.  ------------------------------------------------------------------- Pulmonic valve:   Poorly visualized.  Doppler:  There was no significant regurgitation.  ------------------------------------------------------------------- Tricuspid valve:   Normal thickness leaflets.  Doppler:  There  was moderate-severe regurgitation.  ------------------------------------------------------------------- Right atrium:  The atrium was moderately dilated.  ------------------------------------------------------------------- Pericardium:  There was no pericardial effusion.  ------------------------------------------------------------------- Systemic veins: Inferior vena cava: The vessel was normal in size. The respirophasic diameter changes were in the normal range (>= 50%), consistent with normal central venous pressure.  ------------------------------------------------------------------- Measurements   Left ventricle                           Value        Reference  LV ID, ED, PLAX chordal                  45    mm     43 - 52  LV ID, ES, PLAX chordal                  30.7  mm     23 - 38  LV fx shortening, PLAX chordal           32    %      >=29  LV PW thickness, ED                      10    mm     ---------  IVS/LV PW ratio, ED                      0.75         <=1.3  Stroke volume, 2D                        64    ml     ---------  Stroke volume/bsa, 2D                    35    ml/m^2 ---------  LV ejection fraction, 1-p A4C            64    %      ---------  LV end-diastolic volume, 2-p             52  ml     ---------  LV end-systolic volume, 2-p              21    ml     ---------  LV ejection fraction, 2-p                60    %      ---------  Stroke volume, 2-p                       31    ml     ---------  LV end-diastolic volume/bsa, 2-p         29    ml/m^2 ---------  LV end-systolic volume/bsa, 2-p          12    ml/m^2 ---------  Stroke volume/bsa, 2-p                   17.3  ml/m^2 ---------  LV e&', lateral                           10.1  cm/s   ---------  LV E/e&', lateral                         15.84        ---------  LV e&', medial                            10.1  cm/s   ---------  LV E/e&', medial                          15.84        ---------  LV  e&', average                           10.1  cm/s   ---------  LV E/e&', average                         15.84        ---------    Ventricular septum                       Value        Reference  IVS thickness, ED                        7.51  mm     ---------    LVOT                                     Value        Reference  LVOT ID, S                               19    mm     ---------  LVOT area                                2.84  cm^2   ---------  LVOT peak velocity, S  118   cm/s   ---------  LVOT mean velocity, S                    82.2  cm/s   ---------  LVOT VTI, S                              22.5  cm     ---------  LVOT peak gradient, S                    6     mm Hg  ---------    Aorta                                    Value        Reference  Aortic root ID, ED                       31    mm     ---------    Left atrium                              Value        Reference  LA ID, A-P, ES                           48    mm     ---------  LA ID/bsa, A-P                   (H)     2.66  cm/m^2 <=2.2  LA volume, S                             112   ml     ---------  LA volume/bsa, S                         62    ml/m^2 ---------  LA volume, ES, 1-p A4C                   109   ml     ---------  LA volume/bsa, ES, 1-p A4C               60.4  ml/m^2 ---------  LA volume, ES, 1-p A2C                   107   ml     ---------  LA volume/bsa, ES, 1-p A2C               59.3  ml/m^2 ---------    Mitral valve                             Value        Reference  Mitral E-wave peak velocity              160   cm/s   ---------  Mitral A-wave peak velocity              99.1  cm/s   ---------  Mitral deceleration time         (  L)     106   ms     150 - 230  Mitral peak gradient, D                  10    mm Hg  ---------  Mitral E/A ratio, peak                   1.6          ---------  Mitral regurg VTI, PISA                  151   cm     ---------  Mitral ERO, PISA                          0.05  cm^2   ---------  Mitral regurg volume, PISA               8     ml     ---------    Pulmonary arteries                       Value        Reference  PA pressure, S, DP               (H)     40    mm Hg  <=30    Tricuspid valve                          Value        Reference  Tricuspid regurg peak velocity           305   cm/s   ---------  Tricuspid peak RV-RA gradient            37    mm Hg  ---------    Systemic veins                           Value        Reference  Estimated CVP                            3     mm Hg  ---------    Right ventricle                          Value        Reference  TAPSE                                    22.9  mm     ---------  RV pressure, S, DP               (H)     40    mm Hg  <=30  Legend: (L)  and  (H)  mark values outside specified reference range.  ------------------------------------------------------------------- Prepared and Electronically Authenticated by  Kate Sable, MD 2017-10-04T12:01:15   Transesophageal Echocardiography  Patient:    Safia, Panzer MR #:       [id number] Study Date: 02/20/2016 Gender:     F Age:        74 Height:     160 cm Weight:  72.3 kg BSA:        1.81 m^2 Pt. Status: Room:       [facility]   ADMITTING    Kerry Hough, M.D.  ATTENDING    Kerry Hough, M.D.  Berna Spare, M.D.  REFERRING    Kerry Hough, M.D.  PERFORMING   [facility]  SONOGRAPHER  Alvino Chapel, [facility]  cc:  ------------------------------------------------------------------- LV EF: 60% -   65%  ------------------------------------------------------------------- Indications:      Mitral regurgitation 424.0.  ------------------------------------------------------------------- History:   Risk factors:  Carcinoma in situ of colon, GERD. Diabetes mellitus. Dyslipidemia.  ------------------------------------------------------------------- Study  Conclusions  - Left ventricle: The cavity size was normal. Wall thickness was   increased in a pattern of mild LVH. Systolic function was normal.   The estimated ejection fraction was in the range of 60% to 65%.   There is prolapse of a portion of the anterior leaflet, appears   to be the A2 scallop. - Mitral valve: There is prolapse of a portion of the anterior   leaflet, appears to be the A2 scallop.   The regurgitant jet is very eccentric, not able to quantify with   PISA or vena contracta measurements. Probable moderate to severe   mitral regurgitation. - Left atrium: The atrium was severely dilated. - Right ventricle: The cavity size was mildly dilated. Wall   thickness was normal. - Right atrium: The atrium was moderately to severely dilated. - Atrial septum: No defect or patent foramen ovale was identified. - Tricuspid valve: There was severe regurgitation. - Transgastric images are limited, unable to obtain deep   transgastic images. TEE probe would not advance beyond 37 mm.  ------------------------------------------------------------------- Study data:   Study status:  Routine.  Consent:  The risks, benefits, and alternatives to the procedure were explained to the patient and informed consent was obtained.  Procedure:  The patient reported no pain pre or post test. Initial setup. The patient was brought to the laboratory. Surface ECG leads were monitored. Sedation. Conscious sedation was administered. Transesophageal echocardiography. Topical anesthesia was obtained using viscous lidocaine. A transesophageal probe was inserted by the attending cardiologist. Image quality was adequate.  Study completion:  The patient tolerated the procedure well. There were no complications.         Diagnostic transesophageal echocardiography.  2D and color Doppler.  Birthdate:  Patient birthdate: [birth year].  Age:  Patient is 76 yr old.  Sex:  Gender: female.    BMI: 28.2 kg/m^2.   Blood pressure:     133/74  Patient status:  Outpatient.  Study date: Study date: 02/20/2016. Study time: 10:01 AM.  Location: Endoscopy.  -------------------------------------------------------------------  ------------------------------------------------------------------- Left ventricle:  The cavity size was normal. Wall thickness was increased in a pattern of mild LVH. Systolic function was normal. The estimated ejection fraction was in the range of 60% to 65%. There is prolapse of a portion of the anterior leaflet, appears to be the A2 scallop.  ------------------------------------------------------------------- Aortic valve:   Trileaflet; normal thickness leaflets.  ------------------------------------------------------------------- Aorta:  Aortic root: The aortic root was normal in size. Ascending aorta: The ascending aorta was normal in size.  ------------------------------------------------------------------- Mitral valve:  There is prolapse of a portion of the anterior leaflet, appears to be the A2 scallop. The regurgitant jet is very eccentric, not able to quantify with PISA or vena contracta measurements. Probable moderate to severe mitral regurgitation.  ------------------------------------------------------------------- Left atrium:  The atrium was severely dilated. LAA emptying  velocity is normal at 50 cm/s.  ------------------------------------------------------------------- Atrial septum:  No defect or patent foramen ovale was identified.   ------------------------------------------------------------------- Right ventricle:  The cavity size was mildly dilated. Wall thickness was normal. Systolic function was normal.  ------------------------------------------------------------------- Pulmonic valve:   Not well visualized.  Doppler:   There was no evidence for stenosis.   There was no significant regurgitation.    ------------------------------------------------------------------- Tricuspid valve:   Normal thickness leaflets.  Doppler:   There was no evidence for stenosis.   There was severe regurgitation.  ------------------------------------------------------------------- Right atrium:  The atrium was moderately to severely dilated.  ------------------------------------------------------------------- Pericardium:  There was no pericardial effusion.  ------------------------------------------------------------------- Measurements   Aorta              Value  Aortic root ID     31    mm  Legend: (L)  and  (H)  mark values outside specified reference range.  ------------------------------------------------------------------- Prepared and Electronically Authenticated by  Kerry Hough, M.D. 2017-11-01T15:04:31   Right/Left Heart Cath and Coronary Angiography  Conclusion     There is severe (4+) mitral regurgitation.  Hemodynamic findings consistent with mild pulmonary hypertension.  Angiographic minimal coronary disease.  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  The left ventricular ejection fraction is greater than 65% by visual estimate.   Angiographically only minimal CAD. LV gram did confirm severe MR. Relative normal right heart cath pressures with minimal pulmonary hypertension.  Recommendation: The patient will now be referred to cardiac surgery for consultation to discuss mitral valve repair. TR band removal per protocol. The brachial sheath is removed in the Cath Lab.  Okay to discharge later today.   Indications   Moderate to severe mitral regurgitation [I34.0 (ICD-10-CM)]  Pre-operative cardiovascular examination, valvular heart disease [Z01.810, [email or web address] (ICD-10-CM)]  Procedural Details/Technique   Technical Details PCP: Glenda Chroman, MD CARDIOLOGIST: Dr. [name]  76 year old woman with progression of mitral valve disease now  with moderate severe MR and LV dilation. She is referred for right now for catheterization hard of preop-evaluation for possible Mitral Valve Repair.  Time Out: Verified patient identification, verified procedure, site/side was marked, verified correct patient position, special equipment/implants available, medications/allergies/relevent history reviewed, required imaging and test results available. Performed.   Access:  RIGHT Radial Artery: 6 Fr sheath -- Seldinger technique using Angiocath Micropuncture Kit 10 mL radial cocktail IA; 4000 Units IV Heparin Right Brachial/Antecubital Vein: The existing 18-gauge IV was exchanged over a wire for a 5Fr short sheath  Right Heart Catheterization: 5 Fr Gordy Councilman catheter advanced under fluoroscopy with balloon inflated to the RA, RV, then PCWP-PA for hemodynamic measurement.  Simultaneous FA & PA blood gases checked for SaO2% to calculate FICK CO/CI  Catheter removed completely out of the body with balloon deflated.  Left Heart Catheterization: 5 Fr Catheters advanced or exchanged over a J-wire under direct fluoroscopic guidance into the ascending aorta; TIG 4.0 catheter advanced first.  LV Hemodynamics (LV Gram): Angled pigtail catheter Left Coronary Artery Cineangiography: TIG 4.0 Catheter  Right Coronary Artery Cineangiography: TIG 4.0 Catheter  Following angiography, the catheter was removed completely out of body over wire without complication.  Femoral / Brachial Sheath(s) removed in the Cath Lab with manual pressure for hemostasis.   Radial sheath removed in the Cardiac Catheterization lab with TR Band placed for hemostasis.  TR Band: 0940 Hours; 16 mL air  MEDICATIONS * 25 g IV fentanyl, 1 mgIV Versed * SQ Lidocaine 36m * Radial  Cocktail: 3 mg verapamil in 10 mL NS * Isovue Contrast: 70 mL * Heparin: 4000 Units   Estimated blood loss <50 mL.  During this procedure the patient was administered the following to achieve and  maintain moderate conscious sedation: Versed 1 mg, Fentanyl 25 mcg, while the patient's heart rate, blood pressure, and oxygen saturation were continuously monitored. The period of conscious sedation was 42 minutes, of which I was present face-to-face 100% of this time.    Complications   Complications documented before study signed (04/12/2016 9:53 AM EST)    No complications were associated with this study.  Documented by Leonie Man, MD - 04/12/2016 9:51 AM EST    Coronary Findings   Dominance: Co-dominant  Left Main  Vessel is large. Vessel is angiographically normal.  Left Anterior Descending  Vessel is angiographically normal.  Dist LAD lesion, 15% stenosed. The lesion is smooth. Myocardial bridging  First Diagonal Branch  Vessel is small in size. Vessel is angiographically normal.  First Septal Branch  Vessel is small in size.  Second Diagonal Branch  Vessel is moderate in size. Vessel is angiographically normal.  Second Septal Branch  Vessel is moderate in size.  Third Diagonal Branch  Vessel is small in size.  Third Septal Branch  Vessel is small in size.  Ramus Intermedius  Vessel is small.  Left Circumflex  Vessel is angiographically normal.  First Obtuse Marginal Branch  Vessel is angiographically normal.  Second Obtuse Marginal Branch  Vessel is moderate in size. Left posterolateral branch  Lateral Second Obtuse Marginal Branch  Vessel is small in size.  Third Obtuse Marginal Branch  Vessel is small in size. Actually is a left posterolateral branch  Right Coronary Artery  Vessel is large. Vessel is angiographically normal.  Acute Marginal Branch  Vessel is small in size.  Right Posterior Descending Artery  Vessel is moderate in size.  Inferior Septal  Vessel is small in size.  Right Heart   Right Heart Pressures Hemodynamic findings consistent with mild pulmonary hypertension. PAP/mean: 37/17/26 mmHg Elevated LV EDP consistent with volume  overload. Mildly elevated at 14 mmHg. AO pressure/mean: 118/67/90 mmHg LV pressure/EDP: 122/7/14 mmHg PCWP: 15/19/13 mmHg  No shunt. PVR 217 D/S. 380 2/D/DI PA sat average 66%, AO sat 98% CARDIAC OUTPUT/INDEX: 4.79/2.74    Right Atrium The right atrial size is normal. Right atrial pressure is normal. 9 mmHg    Right Ventricle RV pressure/mean: 39/6/11 mmHg    Wall Motion              Left Heart   Left Ventricle The left ventricular size is normal. The left ventricular systolic function is normal. LV end diastolic pressure is mildly elevated. The left ventricular ejection fraction is greater than 65% by visual estimate. No regional wall motion abnormalities.    Mitral Valve There is severe (4+) mitral regurgitation. Cannot detect    Aortic Valve There is no aortic valve stenosis. There is normal aortic valve motion.    Coronary Diagrams   Diagnostic Diagram     Implants     No implant documentation for this case.  PACS Images   Show images for Cardiac catheterization   Link to Procedure Log   Procedure Log    Hemo Data   Flowsheet Row Most Recent Value  Fick Cardiac Output 4.79 L/min  Fick Cardiac Output Index 2.74 (L/min)/BSA  RA A Wave 9 mmHg  RA V Wave 12 mmHg  RA Mean 9 mmHg  RV Systolic Pressure 39 mmHg  RV Diastolic Pressure 6 mmHg  RV EDP 11 mmHg  PA Systolic Pressure 37 mmHg  PA Diastolic Pressure 17 mmHg  PA Mean 26 mmHg  PW A Wave 15 mmHg  PW V Wave 19 mmHg  PW Mean 13 mmHg  AO Systolic Pressure 694 mmHg  AO Diastolic Pressure 67 mmHg  AO Mean 90 mmHg  LV Systolic Pressure 854 mmHg  LV Diastolic Pressure 7 mmHg  LV EDP 15 mmHg  Arterial Occlusion Pressure Extended Systolic Pressure 627 mmHg  Arterial Occlusion Pressure Extended Diastolic Pressure 64 mmHg  Arterial Occlusion Pressure Extended Mean Pressure 85 mmHg  Left Ventricular Apex Extended Systolic Pressure 035 mmHg  Left Ventricular Apex Extended Diastolic Pressure 7 mmHg  Left  Ventricular Apex Extended EDP Pressure 14 mmHg  QP/QS 1  TPVR Index 9.49 HRUI  TSVR Index 32.85 HRUI  PVR SVR Ratio 0.16  TPVR/TSVR Ratio 0.29      Impression:  Patient has stage D severe symptomatic primary mitral regurgitation. 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  She remains in rate-controlled persistent atrial fibrillation.  I have personally reviewed the patient's recent echocardiograms and diagnostic cardiac catheterization. Transthoracic and transesophageal echocardiograms revealed the presence of mitral valve prolapse with an eccentric jet of mitral regurgitation that is probably severe.  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 There is some posterior annulus calcification but overall both leaflets appear to move reasonably well. Left ventricular systolic function appears close to normal ear there is severe left atrial enlargement.  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 The tricuspid annulus is dilated. The tricuspid leaflets are not well visualized. Diagnostic cardiac catheterization is notable for the absence of significant coronary artery disease. I agree the patient would benefit from mitral valve repair and Maze procedure. She may need tricuspid valve repair. It is possible that she might be candidate for minimally invasive approach. Risks of surgery will be somewhat elevated due to her advanced age, comorbid medical problems, and sedentary lifestyle.  She has not seen a dentist in several years.   Plan:  The patient was counseled at length regarding the indications, risks and potential benefits of mitral valve repair.  The rationale for elective surgery has been explained,  including a comparison between surgery and continued medical therapy with close follow-up.  The likelihood of successful and durable valve repair has been discussed with particular reference to the findings of their recent echocardiogram.  Based upon these findings and previous experience, I have quoted her a greater than 80 percent likelihood of successful valve repair.  The possible need for concomitant tricuspid valve repair was discussed.  The relative risks and benefits of performing a maze procedure at the time of her surgery was discussed at length, including the expected likelihood of long term freedom from recurrent symptomatic atrial fibrillation and/or atrial flutter.  All of her questions been addressed.  The patient is interested in proceeding with surgery in the near future. She will be referred to the dental clinic for evaluation. We will make arrangements for her to undergo formal pulmonary function testing and CT angiography to characterize the feasibility of peripheral arterial cannulation for surgery.  The patient will return in approximately 3-4 weeks to review the results  of these tests and make final plans for surgery. All of her questions have been addressed.  I spent in excess of 90 minutes during the conduct of this office consultation and >50% of this time involved direct face-to-face encounter with the patient for counseling and/or coordination of their care.   Valentina Gu. Roxy Manns, MD 04/27/2016 12:05 PM

## 2016-04-27 NOTE — Patient Instructions (Signed)
Continue all previous medications without any changes at this time  Check your weight regularly and look for other signs of fluid retention such as increased shortness of breath and/or swelling around your ankles  Endocarditis is a potentially serious infection of heart valves or inside lining of the heart.  It occurs more commonly in patients with diseased heart valves (such as patient's with aortic or mitral valve disease) and in patients who have undergone heart valve repair or replacement.  Certain surgical and dental procedures may put you at risk, such as dental cleaning, other dental procedures, or any surgery involving the respiratory, urinary, gastrointestinal tract, gallbladder or prostate gland.   To minimize your chances for develooping endocarditis, maintain good oral health and seek prompt medical attention for any infections involving the mouth, teeth, gums, skin or urinary tract.    Always notify your doctor or dentist about your underlying heart valve condition before having any invasive procedures. You will need to take antibiotics before certain procedures, including all routine dental cleanings or other dental procedures.  Your cardiologist or dentist should prescribe these antibiotics for you to be taken ahead of time.

## 2016-05-01 ENCOUNTER — Other Ambulatory Visit: Payer: Self-pay | Admitting: *Deleted

## 2016-05-01 ENCOUNTER — Encounter: Payer: Medicare Other | Admitting: Thoracic Surgery (Cardiothoracic Vascular Surgery)

## 2016-05-02 ENCOUNTER — Other Ambulatory Visit: Payer: Self-pay | Admitting: *Deleted

## 2016-05-02 DIAGNOSIS — Z91041 Radiographic dye allergy status: Secondary | ICD-10-CM

## 2016-05-02 MED ORDER — DIPHENHYDRAMINE HCL 50 MG PO TABS: 50.0000 mg | ORAL_TABLET | ORAL | 0 refills | Status: DC

## 2016-05-02 MED ORDER — PREDNISONE 50 MG PO TABS: 50.0000 mg | ORAL_TABLET | ORAL | 0 refills | Status: DC

## 2016-05-03 ENCOUNTER — Ambulatory Visit (HOSPITAL_COMMUNITY): Payer: Medicare Other

## 2016-05-04 ENCOUNTER — Ambulatory Visit (HOSPITAL_COMMUNITY): Payer: Medicare Other

## 2016-05-04 ENCOUNTER — Encounter (HOSPITAL_COMMUNITY): Payer: Medicare Other

## 2016-05-07 ENCOUNTER — Encounter (HOSPITAL_COMMUNITY): Payer: Self-pay | Admitting: Dentistry

## 2016-05-07 ENCOUNTER — Ambulatory Visit (HOSPITAL_COMMUNITY): Payer: Self-pay | Admitting: Dentistry

## 2016-05-07 VITALS — BP 127/89 | HR 90 | Temp 97.7°F

## 2016-05-07 DIAGNOSIS — K031 Abrasion of teeth: Secondary | ICD-10-CM

## 2016-05-07 DIAGNOSIS — K08409 Partial loss of teeth, unspecified cause, unspecified class: Secondary | ICD-10-CM

## 2016-05-07 DIAGNOSIS — K06020 Generalized gingival recession, unspecified: Secondary | ICD-10-CM

## 2016-05-07 DIAGNOSIS — Z7901 Long term (current) use of anticoagulants: Secondary | ICD-10-CM

## 2016-05-07 DIAGNOSIS — M264 Malocclusion, unspecified: Secondary | ICD-10-CM

## 2016-05-07 DIAGNOSIS — K029 Dental caries, unspecified: Secondary | ICD-10-CM

## 2016-05-07 DIAGNOSIS — I340 Nonrheumatic mitral (valve) insufficiency: Secondary | ICD-10-CM | POA: Diagnosis not present

## 2016-05-07 DIAGNOSIS — Z01818 Encounter for other preprocedural examination: Secondary | ICD-10-CM | POA: Diagnosis not present

## 2016-05-07 DIAGNOSIS — K0530 Chronic periodontitis, unspecified: Secondary | ICD-10-CM

## 2016-05-07 DIAGNOSIS — M270 Developmental disorders of jaws: Secondary | ICD-10-CM

## 2016-05-07 DIAGNOSIS — K083 Retained dental root: Secondary | ICD-10-CM

## 2016-05-07 DIAGNOSIS — Z9189 Other specified personal risk factors, not elsewhere classified: Secondary | ICD-10-CM

## 2016-05-07 DIAGNOSIS — K036 Deposits [accretions] on teeth: Secondary | ICD-10-CM

## 2016-05-07 NOTE — Patient Instructions (Signed)
[place]    Department of Dental Medicine     DR. KULINSKI      HEART VALVES AND MOUTH CARE:  FACTS:   If you have any infection in your mouth, it can infect your heart valve.  If you heart valve is infected, you will be seriously ill.  Infections in the mouth can be SILENT and do not always cause pain.  Examples of infections in the mouth are gum disease, dental cavities, and abscesses.  Some possible signs of infection are: Bad breath, bleeding gums, or teeth that are sensitive to sweets, hot, and/or cold. There are many other signs as well.  WHAT YOU HAVE TO DO:   Brush your teeth after meals and at bedtime. Spend at least 2 minutes brushing well, especially behind your back teeth and all around your teeth that stand alone. Brush at the gumline also.  Do not go to bed without brushing your teeth and flossing.  If you gums bleed when you brush or floss, do NOT stop brushing or flossing. It usually means that your gums need more attention and better cleaning.   If your Dentist or Dr. Kulinski gave you a prescription mouthwash to use, make sure to use it as directed. If you run out of the medication, get a refill at the pharmacy.   If you were given any other medications or directions by your Dentist, please follow them. If you did not understand the directions or forget what you were told, please call. We will be happy to refresh her memory.  If you need antibiotics before dental procedures, make sure you take them one hour prior to every dental visit as directed.   Get a dental checkup every 4-6 months in order to keep your mouth healthy, or to find and treat any new infection. You will most likely need your teeth cleaned or gums treated at the same time.  If you are not able to come in for your scheduled appointment, call your Dentist as soon as possible to reschedule.  If you have a problem in between dental visits, call your Dentist.  

## 2016-05-07 NOTE — Progress Notes (Signed)
DENTAL CONSULTATION  Date of Consultation:  05/07/2016 Patient Name:   [name] [name] Date of Birth:   1941/01/20 Medical Record Number: [id number]  VITALS: BP 127/89 (BP Location: Left Arm)   Pulse 90   Temp 97.7 F (36.5 C) (Oral)   CHIEF COMPLAINT: Patient referred by Dr. Roxy Manns for dental consultation.   HPI: [name] [name] is a 76 year old female recently diagnosed with severe mitral regurgitation.  Patient with anticipated mitral valve repair, tricuspid valve repair, and possible Maze procedure with Dr. Roxy Manns. The patient is now seen as part of a pre-heart valve surgery dental protocol to rule out dental infection that may affect the patient's systemic health and anticipated heart valve surgery.  The patient currently denies acute toothaches, swellings, or abscesses. Patient was last seen by a Dentist approximately 3-4 years ago. She had a tooth pulled with no complications. This was by a Pharmacist, community in [place], [place].  Patient had multiple crown restorations placed by various dentists in [place], [place]. Patient denies having any partial dentures. 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  PROBLEM LIST: Patient Active Problem List   Diagnosis Date Noted  . Mitral regurgitation     Priority: High  . Tricuspid regurgitation   . Shortness of breath   . Atrial fibrillation, persistent ([place])   . Preoperative cardiovascular examination 04/12/2016  . History of colonic polyps   . Chronic diarrhea   . Personal history of colon cancer 09/15/2015  . Rectal bleeding 09/15/2015  . Abdominal pain 09/15/2015  . RUQ pain 12/16/2012  . Urinary tract infection symptoms 12/16/2012  . Constipation 05/01/2010  . CARCINOMA IN SITU OF COLON 04/26/2010  . DIABETES MELLITUS, TYPE II 04/26/2010  . HYPERCHOLESTEROLEMIA 04/26/2010  . EXTERNAL HEMORRHOIDS 04/26/2010  . GERD 04/26/2010  . DIVERTICULOSIS OF COLON 04/26/2010  . DYSPHAGIA PHARYNGEAL PHASE 04/26/2010    PMH: Past Medical  History:  Diagnosis Date  . Asthma   . Atrial fibrillation, persistent ([place])   . Colon adenoma   . Colon cancer ([place])    status post low anterior resection, limited stage disease requiring no adjuvant therapy  . Diverticulosis   . DM (dermatomyositis)   . Dysrhythmia   . Esophageal dysphagia   . GERD (gastroesophageal reflux disease)   . Hematuria   . Hemorrhoids   . Hiatal hernia   . Hypercholesterolemia   . Mitral regurgitation   . PONV (postoperative nausea and vomiting)   . Schatzki's ring   . Tricuspid regurgitation     PSH: Past Surgical History:  Procedure Laterality Date  . APPENDECTOMY    . CARDIAC CATHETERIZATION N/A 04/12/2016   Procedure: Right/Left Heart Cath and Coronary Angiography;  Surgeon: Leonie Man, MD;  Location: [facility];  Service: Cardiovascular;  Laterality: N/A;  . COLONOSCOPY  11/09   Dr. Gala Romney- external hemorrhoidal tags o/w normal rectal mucosa, s/p surgical resection with a normal appearing anastomosis 12cm, pan colonic diverticulum  . COLONOSCOPY N/A 06/02/2012   [id number] post low anterior resection. Pancolonic diverticulosis. Colonic polyp-tubular adenoma. Surveillance due 2019.   Marland Kitchen COLONOSCOPY N/A 10/13/2015   Procedure: COLONOSCOPY;  Surgeon: Daneil Dolin, MD;  [name];  Service: Endoscopy;  Laterality: N/A;  0930  . ESOPHAGOGASTRODUODENOSCOPY  07/2007   Dr. Daiva Nakayama cervical esophageal web, schatzki ring, large hiatal hernia  . INGUNAL HERNIA REPAIR    . LOW ANTERIOR BOWEL RESECTION     NO ADJ CHEMO  . TEE WITHOUT CARDIOVERSION N/A 02/20/2016   Procedure:  TRANSESOPHAGEAL ECHOCARDIOGRAM (TEE);  Surgeon: Arnoldo Lenis, MD;  [name];  Service: Endoscopy;  Laterality: N/A;  . TUBAL LIGATION    . VEIN LIGATION AND STRIPPING      ALLERGIES: Allergies  Allergen Reactions  . Hydrocodone-Acetaminophen Hives  . Iohexol      Code: HIVES, Desc: pt. had a severe allergic reaction to IV  contrast the last time she was injected and had to be seen in the ER.  Hives and sob.   . Nabumetone Rash  . Prednisone Rash    MEDICATIONS: Current Outpatient Prescriptions  Medication Sig Dispense Refill  . acetaminophen (TYLENOL) 325 MG tablet Take 650 mg by mouth every 6 (six) hours as needed for pain.    Marland Kitchen albuterol (PROVENTIL HFA;VENTOLIN HFA) 108 (90 BASE) MCG/ACT inhaler Inhale 2 puffs into the lungs every 6 (six) hours as needed for wheezing.    Marland Kitchen ALPRAZolam (XANAX) 0.5 MG tablet Take 0.5 mg by mouth 2 (two) times daily as needed (for anxiety/sleep (scheduled at bedtime)).     . calcium carbonate (OS-CAL) 600 MG TABS tablet Take 600 mg by mouth 2 (two) times daily with a meal.    . Coenzyme Q10 100 MG TABS Take 100 mg by mouth every evening.     . diphenhydrAMINE (BENADRYL) 50 MG tablet Take 1 tablet (50 mg total) by mouth as directed. Take one tab at 8 pm 05/07/16, take one tab at 2 am 05/08/16, take one tab at 8 am 05/08/16. 3 tablet 0  . docusate sodium (COLACE) 100 MG capsule Take 100-200 mg by mouth daily as needed for mild constipation.    . metoprolol succinate (TOPROL-XL) 50 MG 24 hr tablet Take 1 tablet (50 mg total) by mouth daily. 90 tablet 3  . Multiple Vitamin (MULTIVITAMIN) tablet Take 1 tablet by mouth daily.      Marland Kitchen omeprazole (PRILOSEC) 20 MG capsule Take 1 capsule (20 mg total) by mouth daily. 30 capsule 3  . PARoxetine (PAXIL) 20 MG tablet Take 20 mg by mouth daily.     Vladimir Faster Glycol-Propyl Glycol (SYSTANE ULTRA) 0.4-0.3 % SOLN Place 1 drop into both eyes 3 (three) times daily.    . predniSONE (DELTASONE) 50 MG tablet Take 1 tablet (50 mg total) by mouth as directed. Take one tab at 8 pm 05/07/16, one tab at 2 am 05/08/16, one tab at 8 am 05/08/16 3 tablet 0  . ranitidine (ZANTAC) 150 MG tablet Take one tab (150mg ) at 6:00 pm the evening prior to heart cath, one tab at midnight, and take the remaining tab with you to the hospital the morning of. 3 tablet 0  .  rivaroxaban (XARELTO) 20 MG TABS tablet Take 1 tablet (20 mg total) by mouth daily with supper. (Patient taking differently: Take 20 mg by mouth daily with supper. Has stopped for procedure) 28 tablet 0  . simvastatin (ZOCOR) 40 MG tablet Take 40 mg by mouth at bedtime.     . sodium chloride (OCEAN) 0.65 % SOLN nasal spray Place 1 spray into both nostrils as needed for congestion.    . traMADol (ULTRAM) 50 MG tablet Take 50 mg by mouth every 12 (twelve) hours as needed for moderate pain.      No current facility-administered medications for this visit.     LABS: Lab Results  Component Value Date   WBC 6.3 04/12/2016   HGB 11.9 (L) 04/12/2016   HCT 37.7 04/12/2016   MCV 94.5 04/12/2016  PLT 253 04/12/2016      Component Value Date/Time   NA 140 04/12/2016 0735   K 3.7 04/12/2016 0735   CL 106 04/12/2016 0735   CO2 26 04/12/2016 0735   GLUCOSE 129 (H) 04/12/2016 0735   BUN 13 04/12/2016 0735   CREATININE 0.85 04/12/2016 0735   CALCIUM 9.0 04/12/2016 0735   GFRNONAA >60 04/12/2016 0735   GFRAA >60 04/12/2016 0735   Lab Results  Component Value Date   INR 1.05 04/12/2016   No results found for: PTT  SOCIAL HISTORY: Social History   Social History  . Marital status: Married    Spouse name: N/A  . Number of children: N/A  . Years of education: N/A   Occupational History  . Not on file.   Social History Main Topics  . Smoking status: Never Smoker  . Smokeless tobacco: Never Used  . Alcohol use 0.0 oz/week     Comment: Rarely  . Drug use: No  . Sexual activity: Not on file   Other Topics Concern  . Not on file   Social History Narrative  . No narrative on file    FAMILY HISTORY: Family History  Problem Relation Age of Onset  . Breast cancer Mother   . Rheum arthritis Father   . Cancer - Lung Sister   . Brain cancer Sister   . Prostate cancer Brother   . Aneurysm Brother   . Colon cancer Neg Hx     REVIEW OF SYSTEMS: Reviewed the review of systems  from Dr. Guy Sandifer note of 04/27/2016 with the patient with changes noted in bold.  Review of Systems:              General:                      normal appetite, decreased energy, no weight gain, no weight loss, no fever             Cardiac:                       no chest pain with exertion, no chest pain at rest, + SOB with exertion, occasional resting SOB, + occasional PND, no orthopnea, no palpitations, + arrhythmia, + atrial fibrillation, + LE edema, + dizzy spells             Respiratory:                 + shortness of breath, no home oxygen, no productive cough, + dry cough, no bronchitis, + wheezing, no hemoptysis, + asthma, no pain with inspiration or cough, no sleep apnea, no CPAP at night             GI:                               mild difficulty swallowing, + reflux, no frequent heartburn, + hiatal hernia, no abdominal pain, no constipation, no diarrhea, + hematochezia, no hematemesis, no melena             GU:                              no dysuria,  no frequency, no urinary tract infection, no hematuria, history of kidney stones, no kidney disease  Vascular:                     no pain suggestive of claudication, no pain in feet, + leg cramps, + varicose veins, no DVT, no non-healing foot ulcer             Neuro:                         no stroke, no TIA's, no seizures, + headaches, no temporary blindness one eye,  no slurred speech, no peripheral neuropathy, no chronic pain, no instability of gait, mild memory/cognitive dysfunction             Musculoskeletal:         mild arthritis, mild joint swelling, no myalgias, no difficulty walking, normal mobility              Skin:                            no rash, no itching, no skin infections, no pressure sores or ulcerations             Psych:                         + anxiety, + depression, + nervousness, no unusual recent stress, positive for dental phobia             Eyes:                           no blurry vision, +  floaters, no recent vision changes, + wears reading glasses              ENT:                            no hearing loss, no loose or painful teeth, no partial dentures, last saw dentist 3 or 4 years ago and [place], [place].             Hematologic:               + easy bruising, no abnormal bleeding, no clotting disorder, no frequent epistaxis             Endocrine:                   no diabetes, does not check CBG's at home                     DENTAL HISTORY: CHIEF COMPLAINT: Patient referred by Dr. Roxy Manns for dental consultation.   HPI: [name] [name] is a 76 year old female recently diagnosed with severe mitral regurgitation.  Patient with anticipated mitral valve repair, tricuspid valve repair, and possible Maze procedure with Dr. Roxy Manns. The patient is now seen as part of a pre-heart valve surgery dental protocol to rule out dental infection that may affect the patient's systemic health and anticipated heart valve surgery.  The patient currently denies acute toothaches, swellings, or abscesses. Patient was last seen by a Dentist approximately 3-4 years ago. She had a tooth pulled with no complications. This was by a Pharmacist, community in [place], [place].  Patient had multiple crown restorations placed by various dentist in [place], [place]. Patient denies having any partial dentures. 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  DENTAL  EXAMINATION: GENERAL: The patient is a well-developed, well-nourished female in no acute distress HEAD AND NECK: There is no palpable neck lymphadenopathy. The patient has bilateral crepitus but denies acute TMJ symptoms. INTRAORAL EXAM: Patient has normal saliva. There is no evidence of oral abscess formation. There is atrophy of the edentulous alveolar ridges. Patient has pigmented lesions in the area of tooth numbers 18, 30, 31 that most likely represents amalgam tattoos. DENTITION: Patient is missing tooth numbers 1, 2, 12, 15, 16,17-20, and 29-32. There is a retained  root segment in the area of tooth #5. Patient has attrition of mandibular anterior tooth numbers 22 through 57. Multiple flexure lesions are noted. Patient has extensive abfraction involving tooth #21. PERIODONTAL: Patient has chronic periodontitis with plaque and calculus accumulations, generalized gingival, and mandibular anterior tooth mobility as charted. There is incipient to moderate bone loss noted. DENTAL CARIES/SUBOPTIMAL RESTORATIONS: Dental caries are noted as per dental charting form. Patient has suboptimal dental restorations as charted. ENDODONTIC: The patient denies acute pulpitis symptoms. I do not see any evidence of periapical pathology or radiolucency. CROWN AND BRIDGE: There are crown restoration on tooth numbers 3, 4, 13, and 14. The mesial margin on the crown on tooth #3 is less than ideal. PROSTHODONTIC: Patient denies having partial dentures. OCCLUSION: Patient has a poor occlusal scheme secondary to multiple missing teeth, multiple diastemas, supra-eruption and drifting of the unopposed teeth into the edentulous areas, and lack of replacement of all missing teeth with dental prostheses.  RADIOGRAPHIC INTERPRETATION:  An orthopantogram was taken and supplemented with 12 periapical radiographs. There are multiple missing teeth.   ASSESSMENTS: 1. Severe mitral regurgitation 2. Tricuspid valve regurgitation 3. Chronic anticoagulation 4. Risk for bleeding with invasive dental procedures 5. Dental caries 6. Extensive abfraction lesion on tooth #21  7.Chronic periodontitis with bone loss 8. Gingival recession 9. Accretions 10. Multiple missing teeth 11. Retained root segment #5. 12. Supra-eruption and drifting of the unopposed teeth into the edentulous areas 13. Poor occlusal scheme and malocclusion 14. Risk for complications up to and including death with anticipated invasive dental procedures in the operating room with general anesthesia.  15. Dental phobia 16.  Bilateral mandibular lingual tori 17. Mandibular anterior incisal attrition  PLAN/RECOMMENDATIONS: 1. I discussed the risks, benefits, and complications of various treatment options with the patient in relationship to her medical and dental conditions, anticipated heart valve surgery, and risk for endocarditis. We discussed various treatment options to include no treatment, multiple extractions with alveoloplasty, pre-prosthetic surgery as indicated, periodontal therapy, dental restorations, root canal therapy, crown and bridge therapy, implant therapy, and replacement of missing teeth as indicated. We also discussed referral to an oral surgeon for dental treatment at this time. The patient refuses referral to an oral surgeon. The patient currently wishes to proceed with extraction of tooth numbers 5 and 21 with alveoloplasty and initial periodontal therapy in the operating room with general anesthesia. Patient did not wish to proceed with bilateral mandibular tori reductions at this time as she does not wish to pursue fabrication of partial dentures in the future. The operating room procedure has been scheduled for 05/21/2016 at 7:30 AM at [facility].  Patient has been instructed to discontinue Xarelto 2 days prior to the dental surgery. Last dose will be on 05/18/2016. Patient will then proceed with heart valve surgery at the discretion of Dr. Roxy Manns after adequate healing from dental extraction sites.  2. Discussion of findings with medical team and coordination of future medical and dental  care as needed.  I spent in excess of 120 minutes during the conduct of this consultation and >50% of this time involved direct face-to-face encounter for counseling and/or coordination of the patient's care.    Lenn Cal, DDS

## 2016-05-08 ENCOUNTER — Ambulatory Visit (HOSPITAL_COMMUNITY)
Admission: RE | Admit: 2016-05-08 | Discharge: 2016-05-08 | Disposition: A | Discharge status: Home / Self Care | Payer: Medicare Other | Source: Ambulatory Visit | Attending: Thoracic Surgery (Cardiothoracic Vascular Surgery) | Admitting: Thoracic Surgery (Cardiothoracic Vascular Surgery)

## 2016-05-08 DIAGNOSIS — I7409 Other arterial embolism and thrombosis of abdominal aorta: Secondary | ICD-10-CM

## 2016-05-08 DIAGNOSIS — I7101 Dissection of thoracic aorta: Secondary | ICD-10-CM | POA: Diagnosis not present

## 2016-05-08 DIAGNOSIS — Z9889 Other specified postprocedural states: Secondary | ICD-10-CM | POA: Insufficient documentation

## 2016-05-08 DIAGNOSIS — Z01818 Encounter for other preprocedural examination: Secondary | ICD-10-CM

## 2016-05-08 DIAGNOSIS — I71019 Dissection of thoracic aorta, unspecified: Secondary | ICD-10-CM

## 2016-05-08 DIAGNOSIS — I340 Nonrheumatic mitral (valve) insufficiency: Secondary | ICD-10-CM

## 2016-05-08 DIAGNOSIS — R918 Other nonspecific abnormal finding of lung field: Secondary | ICD-10-CM | POA: Diagnosis not present

## 2016-05-08 DIAGNOSIS — K449 Diaphragmatic hernia without obstruction or gangrene: Secondary | ICD-10-CM | POA: Insufficient documentation

## 2016-05-08 DIAGNOSIS — R911 Solitary pulmonary nodule: Secondary | ICD-10-CM

## 2016-05-08 HISTORY — DX: Solitary pulmonary nodule: R91.1

## 2016-05-08 MED ORDER — IOPAMIDOL (ISOVUE-370) INJECTION 76%
100.0000 mL | Freq: Once | INTRAVENOUS | Status: AC | PRN
  Administered 2016-05-08: 100 mL via INTRAVENOUS

## 2016-05-11 ENCOUNTER — Ambulatory Visit (HOSPITAL_COMMUNITY)
Admission: RE | Admit: 2016-05-11 | Discharge: 2016-05-11 | Disposition: A | Discharge status: Home / Self Care | Payer: Medicare Other | Source: Ambulatory Visit | Attending: Thoracic Surgery (Cardiothoracic Vascular Surgery) | Admitting: Thoracic Surgery (Cardiothoracic Vascular Surgery)

## 2016-05-11 DIAGNOSIS — R0600 Dyspnea, unspecified: Secondary | ICD-10-CM | POA: Insufficient documentation

## 2016-05-11 DIAGNOSIS — Z01818 Encounter for other preprocedural examination: Secondary | ICD-10-CM

## 2016-05-11 LAB — PULMONARY FUNCTION TEST
DL/VA % pred: 88 %
DL/VA: 4.03 ml/min/mmHg/L
DLCO UNC % PRED: 65 %
DLCO UNC: 14.21 ml/min/mmHg
FEF 25-75 PRE: 0.68 L/s
FEF 25-75 Post: 1.37 L/sec
FEF2575-%Change-Post: 101 %
FEF2575-%Pred-Post: 92 %
FEF2575-%Pred-Pre: 45 %
FEV1-%Change-Post: 23 %
FEV1-%PRED-POST: 77 %
FEV1-%PRED-PRE: 63 %
FEV1-POST: 1.47 L
FEV1-Pre: 1.19 L
FEV1FVC-%Change-Post: 2 %
FEV1FVC-%Pred-Pre: 88 %
FEV6-%CHANGE-POST: 20 %
FEV6-%PRED-POST: 90 %
FEV6-%Pred-Pre: 74 %
FEV6-POST: 2.16 L
FEV6-PRE: 1.78 L
FEV6FVC-%CHANGE-POST: 0 %
FEV6FVC-%PRED-POST: 105 %
FEV6FVC-%Pred-Pre: 105 %
FVC-%Change-Post: 21 %
FVC-%PRED-POST: 85 %
FVC-%Pred-Pre: 70 %
FVC-Post: 2.16 L
FVC-Pre: 1.79 L
POST FEV1/FVC RATIO: 68 %
POST FEV6/FVC RATIO: 100 %
Pre FEV1/FVC ratio: 66 %
Pre FEV6/FVC Ratio: 100 %
RV % PRED: 121 %
RV: 2.69 L
TLC % PRED: 101 %
TLC: 4.84 L

## 2016-05-11 MED ORDER — ALBUTEROL SULFATE (2.5 MG/3ML) 0.083% IN NEBU
2.5000 mg | INHALATION_SOLUTION | Freq: Once | RESPIRATORY_TRACT | Status: AC
  Administered 2016-05-11: 2.5 mg via RESPIRATORY_TRACT

## 2016-05-15 NOTE — Pre-Procedure Instructions (Signed)
Hayden Vozzella Belleau  05/15/2016      [id number] - [place], [place] [address] [place] [address] Phone: [phone] Fax: [phone]    Your procedure is scheduled on Monday January 29.  Report to [facility] Admitting at 5:30 A.M.  Call this number if you have problems the morning of surgery:  [phone]   Remember:  Do not eat food or drink liquids after midnight.  Take these medicines the morning of surgery with A SIP OF WATER: metoprolol (toprol-XL), prilosec (omeprazole), acetaminophen (tylenol) or ultram (tramadol) if needed, albuterol (please bring inhaler to hospital with you)  7 days prior to surgery STOP taking any Aspirin, Aleve, Naproxen, Ibuprofen, Motrin, Advil, Goody's, BC's, all herbal medications, fish oil, and all vitamins    Do not wear jewelry, make-up or nail polish.  Do not wear lotions, powders, or perfumes, or deoderant.  Do not shave 48 hours prior to surgery.  Men may shave face and neck.  Do not bring valuables to the hospital.  [facility] is not responsible for any belongings or valuables.  Contacts, dentures or bridgework may not be worn into surgery.  Leave your suitcase in the car.  After surgery it may be brought to your room.  For patients admitted to the hospital, discharge time will be determined by your treatment team.  Patients discharged the day of surgery will not be allowed to drive home.  Special instructions:    [facility]- Preparing For Surgery  Before surgery, you can play an important role. Because skin is not sterile, your skin needs to be as free of germs as possible. You can reduce the number of germs on your skin by washing with CHG (chlorahexidine gluconate) Soap before surgery.  CHG is an antiseptic cleaner which kills germs and bonds with the skin to continue killing germs even after washing.  Please do not use if you have an allergy to CHG or  antibacterial soaps. If your skin becomes reddened/irritated stop using the CHG.  Do not shave (including legs and underarms) for at least 48 hours prior to first CHG shower. It is OK to shave your face.  Please follow these instructions carefully.   1. Shower the NIGHT BEFORE SURGERY and the MORNING OF SURGERY with CHG.   2. If you chose to wash your hair, wash your hair first as usual with your normal shampoo.  3. After you shampoo, rinse your hair and body thoroughly to remove the shampoo.  4. Use CHG as you would any other liquid soap. You can apply CHG directly to the skin and wash gently with a scrungie or a clean washcloth.   5. Apply the CHG Soap to your body ONLY FROM THE NECK DOWN.  Do not use on open wounds or open sores. Avoid contact with your eyes, ears, mouth and genitals (private parts). Wash genitals (private parts) with your normal soap.  6. Wash thoroughly, paying special attention to the area where your surgery will be performed.  7. Thoroughly rinse your body with warm water from the neck down.  8. DO NOT shower/wash with your normal soap after using and rinsing off the CHG Soap.  9. Pat yourself dry with a CLEAN TOWEL.   10. Wear CLEAN PAJAMAS   11. Place CLEAN SHEETS on your bed the night of your first shower and DO NOT SLEEP WITH PETS.  Day of Surgery: Do not apply any deodorants/lotions. Please wear clean clothes to the hospital/surgery center.

## 2016-05-16 ENCOUNTER — Inpatient Hospital Stay (HOSPITAL_COMMUNITY)
Admission: RE | Admit: 2016-05-16 | Discharge: 2016-05-16 | Disposition: A | Discharge status: Home / Self Care | Payer: Self-pay | Source: Ambulatory Visit
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
## 2016-05-17 DIAGNOSIS — J449 Chronic obstructive pulmonary disease, unspecified: Secondary | ICD-10-CM | POA: Diagnosis not present

## 2016-05-17 DIAGNOSIS — E7800 Pure hypercholesterolemia, unspecified: Secondary | ICD-10-CM | POA: Diagnosis not present

## 2016-05-17 DIAGNOSIS — I4891 Unspecified atrial fibrillation: Secondary | ICD-10-CM | POA: Diagnosis not present

## 2016-05-17 DIAGNOSIS — M159 Polyosteoarthritis, unspecified: Secondary | ICD-10-CM | POA: Diagnosis not present

## 2016-05-18 ENCOUNTER — Encounter (HOSPITAL_COMMUNITY): Payer: Self-pay | Admitting: *Deleted
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
## 2016-05-20 ENCOUNTER — Encounter (HOSPITAL_COMMUNITY): Payer: Self-pay | Admitting: Anesthesiology

## 2016-05-20 NOTE — Anesthesia Preprocedure Evaluation (Addendum)
Anesthesia Evaluation  Patient identified by MRN, date of birth, ID band Patient awake    Reviewed: Allergy & Precautions, NPO status , Patient's Chart, lab work & pertinent test results  History of Anesthesia Complications (+) PONV and history of anesthetic complications  Airway Mallampati: II  TM Distance: >3 FB Neck ROM: Full    Dental  (+) Poor Dentition   Pulmonary neg pulmonary ROS, shortness of breath and with exertion, asthma , Current Smoker,    Pulmonary exam normal breath sounds clear to auscultation       Cardiovascular + Peripheral Vascular Disease  + dysrhythmias Atrial Fibrillation + Valvular Problems/Murmurs MR  Rhythm:Irregular Rate:Normal + Systolic murmurs    Neuro/Psych  Neuromuscular disease negative psych ROS   GI/Hepatic Neg liver ROS, hiatal hernia, GERD  Medicated and Controlled,Patient received Oral Contrast Agents,Dental caries Hx/o Schatzki ring Esophageal dysphagia Hx/o colon Ca S/P Low anterior resection   Endo/Other  diabetes, Poorly Controlled, Type 2, Oral Hypoglycemic AgentsHypercholesterolemia Obesity   Renal/GU Hx/o Renal calculi   negative genitourinary   Musculoskeletal OA left knee Hx/o Dermatomyositis   Abdominal   Peds  Hematology negative hematology ROS (+)   Anesthesia Other Findings   Reproductive/Obstetrics                            Lab Results  Component Value Date   WBC 6.3 04/12/2016   HGB 11.9 (L) 04/12/2016   HCT 37.7 04/12/2016   MCV 94.5 04/12/2016   PLT 253 04/12/2016     Chemistry      Component Value Date/Time   NA 140 04/12/2016 0735   K 3.7 04/12/2016 0735   CL 106 04/12/2016 0735   CO2 26 04/12/2016 0735   BUN 13 04/12/2016 0735   CREATININE 0.85 04/12/2016 0735      Component Value Date/Time   CALCIUM 9.0 04/12/2016 0735   ALKPHOS 90 12/16/2012 0953   AST 20 12/16/2012 0953   ALT 13 12/16/2012 0953   BILITOT  0.4 12/16/2012 0953     EKG: atrial fibrillation, rate 96, non specific T-wave Abnormalities. Echo 01/25/2016: - Left ventricle: The cavity size was normal. Wall thickness was   normal. Systolic function was normal. The estimated ejection   fraction was in the range of 60% to 65%. Wall motion was normal;   there were no regional wall motion abnormalities. The study is   not technically sufficient to allow evaluation of LV diastolic   function. Doppler parameters are consistent with high ventricular   filling pressure. - Mitral valve: Calcified annulus. There was moderate to severe   (closer to severe) eccentric regurgitation. - Left atrium: The atrium was severely dilated. - Right atrium: The atrium was moderately dilated. - Tricuspid valve: There was moderate-severe regurgitation. - Pulmonary arteries: PA peak pressure: 40 mm Hg (S). Anesthesia Physical Anesthesia Plan  ASA: III  Anesthesia Plan: General   Post-op Pain Management:    Induction: Intravenous  Airway Management Planned: Oral ETT  Additional Equipment:   Intra-op Plan:   Post-operative Plan: Extubation in OR  Informed Consent: I have reviewed the patients History and Physical, chart, labs and discussed the procedure including the risks, benefits and alternatives for the proposed anesthesia with the patient or authorized representative who has indicated his/her understanding and acceptance.   Dental advisory given  Plan Discussed with: CRNA, Anesthesiologist and Surgeon  Anesthesia Plan Comments:        Anesthesia Quick  Evaluation

## 2016-05-21 ENCOUNTER — Ambulatory Visit (HOSPITAL_COMMUNITY): Payer: Medicare Other | Admitting: Certified Registered Nurse Anesthetist

## 2016-05-21 ENCOUNTER — Encounter (HOSPITAL_COMMUNITY)
Admission: RE | Disposition: A | Discharge status: Home / Self Care | Payer: Self-pay | Source: Ambulatory Visit | Attending: Dentistry

## 2016-05-21 ENCOUNTER — Encounter (HOSPITAL_COMMUNITY): Payer: Self-pay | Admitting: Certified Registered Nurse Anesthetist

## 2016-05-21 ENCOUNTER — Ambulatory Visit (HOSPITAL_COMMUNITY): Payer: Medicare Other | Admitting: Vascular Surgery

## 2016-05-21 ENCOUNTER — Ambulatory Visit (HOSPITAL_COMMUNITY)
Admission: RE | Admit: 2016-05-21 | Discharge: 2016-05-21 | Disposition: A | Discharge status: Home / Self Care | Payer: Medicare Other | Source: Ambulatory Visit | Attending: Dentistry | Admitting: Dentistry

## 2016-05-21 DIAGNOSIS — Z79899 Other long term (current) drug therapy: Secondary | ICD-10-CM | POA: Insufficient documentation

## 2016-05-21 DIAGNOSIS — J449 Chronic obstructive pulmonary disease, unspecified: Secondary | ICD-10-CM | POA: Insufficient documentation

## 2016-05-21 DIAGNOSIS — K0530 Chronic periodontitis, unspecified: Secondary | ICD-10-CM | POA: Diagnosis not present

## 2016-05-21 DIAGNOSIS — K083 Retained dental root: Secondary | ICD-10-CM | POA: Diagnosis not present

## 2016-05-21 DIAGNOSIS — Z7901 Long term (current) use of anticoagulants: Secondary | ICD-10-CM | POA: Diagnosis not present

## 2016-05-21 DIAGNOSIS — I081 Rheumatic disorders of both mitral and tricuspid valves: Secondary | ICD-10-CM | POA: Insufficient documentation

## 2016-05-21 DIAGNOSIS — Z85038 Personal history of other malignant neoplasm of large intestine: Secondary | ICD-10-CM | POA: Diagnosis not present

## 2016-05-21 DIAGNOSIS — K219 Gastro-esophageal reflux disease without esophagitis: Secondary | ICD-10-CM | POA: Insufficient documentation

## 2016-05-21 DIAGNOSIS — K029 Dental caries, unspecified: Secondary | ICD-10-CM

## 2016-05-21 DIAGNOSIS — I340 Nonrheumatic mitral (valve) insufficiency: Secondary | ICD-10-CM | POA: Diagnosis not present

## 2016-05-21 DIAGNOSIS — I5032 Chronic diastolic (congestive) heart failure: Secondary | ICD-10-CM | POA: Diagnosis not present

## 2016-05-21 DIAGNOSIS — K031 Abrasion of teeth: Secondary | ICD-10-CM

## 2016-05-21 DIAGNOSIS — K032 Erosion of teeth: Secondary | ICD-10-CM | POA: Diagnosis present

## 2016-05-21 DIAGNOSIS — I481 Persistent atrial fibrillation: Secondary | ICD-10-CM | POA: Insufficient documentation

## 2016-05-21 DIAGNOSIS — K0889 Other specified disorders of teeth and supporting structures: Secondary | ICD-10-CM | POA: Insufficient documentation

## 2016-05-21 DIAGNOSIS — K036 Deposits [accretions] on teeth: Secondary | ICD-10-CM | POA: Diagnosis not present

## 2016-05-21 DIAGNOSIS — I071 Rheumatic tricuspid insufficiency: Secondary | ICD-10-CM

## 2016-05-21 DIAGNOSIS — E7800 Pure hypercholesterolemia, unspecified: Secondary | ICD-10-CM | POA: Diagnosis not present

## 2016-05-21 HISTORY — PX: MULTIPLE EXTRACTIONS WITH ALVEOLOPLASTY: SHX5342

## 2016-05-21 HISTORY — DX: Cardiac murmur, unspecified: R01.1

## 2016-05-21 HISTORY — DX: Personal history of urinary calculi: Z87.442

## 2016-05-21 HISTORY — DX: Acute embolism and thrombosis of unspecified deep veins of unspecified lower extremity: I82.409

## 2016-05-21 HISTORY — DX: Dyspnea, unspecified: R06.00

## 2016-05-21 LAB — BASIC METABOLIC PANEL
ANION GAP: 8 (ref 5–15)
BUN: 11 mg/dL (ref 6–20)
CHLORIDE: 106 mmol/L (ref 101–111)
CO2: 29 mmol/L (ref 22–32)
CREATININE: 0.73 mg/dL (ref 0.44–1.00)
Calcium: 8.9 mg/dL (ref 8.9–10.3)
GFR calc non Af Amer: >60 mL/min (ref >60)
Glucose, Bld: 97 mg/dL (ref 65–99)
POTASSIUM: 4 mmol/L (ref 3.5–5.1)
SODIUM: 143 mmol/L (ref 135–145)

## 2016-05-21 LAB — CBC
HEMATOCRIT: 35.1 % — AB (ref 36.0–46.0)
HEMOGLOBIN: 11.1 g/dL — AB (ref 12.0–15.0)
MCH: 30 pg (ref 26.0–34.0)
MCHC: 31.6 g/dL (ref 30.0–36.0)
MCV: 94.9 fL (ref 78.0–100.0)
PLATELETS: 220 10*3/uL (ref 150–400)
RBC: 3.7 MIL/uL — AB (ref 3.87–5.11)
RDW: 13.2 % (ref 11.5–15.5)
WBC: 5.9 10*3/uL (ref 4.0–10.5)

## 2016-05-21 LAB — PROTIME-INR
INR: 1.06
Prothrombin Time: 13.8 seconds (ref 11.4–15.2)

## 2016-05-21 SURGERY — MULTIPLE EXTRACTION WITH ALVEOLOPLASTY
Anesthesia: General | Site: Mouth

## 2016-05-21 MED ORDER — FENTANYL CITRATE (PF) 100 MCG/2ML IJ SOLN
INTRAMUSCULAR | Status: DC | PRN
  Administered 2016-05-21 (×2): 50 ug via INTRAVENOUS

## 2016-05-21 MED ORDER — LIDOCAINE-EPINEPHRINE 2 %-1:100000 IJ SOLN
INTRAMUSCULAR | Status: DC | PRN
  Administered 2016-05-21: 3.4 mL

## 2016-05-21 MED ORDER — ALBUTEROL SULFATE HFA 108 (90 BASE) MCG/ACT IN AERS
INHALATION_SPRAY | RESPIRATORY_TRACT | Status: AC
  Filled 2016-05-21: qty 6.7

## 2016-05-21 MED ORDER — DIPHENHYDRAMINE HCL 50 MG/ML IJ SOLN
INTRAMUSCULAR | Status: AC
  Filled 2016-05-21: qty 1

## 2016-05-21 MED ORDER — DIPHENHYDRAMINE HCL 50 MG/ML IJ SOLN
INTRAMUSCULAR | Status: DC | PRN
  Administered 2016-05-21: 6.25 mg via INTRAVENOUS

## 2016-05-21 MED ORDER — ROCURONIUM BROMIDE 100 MG/10ML IV SOLN
INTRAVENOUS | Status: DC | PRN
  Administered 2016-05-21: 5 mg via INTRAVENOUS

## 2016-05-21 MED ORDER — BUPIVACAINE-EPINEPHRINE 0.5% -1:200000 IJ SOLN
INTRAMUSCULAR | Status: DC | PRN
  Administered 2016-05-21: 3.6 mL

## 2016-05-21 MED ORDER — DIPHENHYDRAMINE HCL 50 MG/ML IJ SOLN
INTRAMUSCULAR | Status: AC
Start: 2016-05-21 — End: 2016-05-21
  Filled 2016-05-21: qty 1

## 2016-05-21 MED ORDER — PHENYLEPHRINE HCL 10 MG/ML IJ SOLN
INTRAVENOUS | Status: DC | PRN
  Administered 2016-05-21: 25 ug/min via INTRAVENOUS

## 2016-05-21 MED ORDER — LIDOCAINE HCL (CARDIAC) 20 MG/ML IV SOLN
INTRAVENOUS | Status: DC | PRN
  Administered 2016-05-21: 80 mg via INTRAVENOUS

## 2016-05-21 MED ORDER — MEPERIDINE HCL 25 MG/ML IJ SOLN: 6.2500 mg | INTRAMUSCULAR | Status: DC | PRN

## 2016-05-21 MED ORDER — AMINOCAPROIC ACID SOLUTION 5% (50 MG/ML)
10.0000 mL | ORAL | Status: DC
  Filled 2016-05-21: qty 100

## 2016-05-21 MED ORDER — METOCLOPRAMIDE HCL 5 MG/ML IJ SOLN: 10.0000 mg | Freq: Once | INTRAMUSCULAR | Status: DC | PRN

## 2016-05-21 MED ORDER — PROPOFOL 10 MG/ML IV BOLUS
INTRAVENOUS | Status: AC
  Filled 2016-05-21: qty 20

## 2016-05-21 MED ORDER — LIDOCAINE-EPINEPHRINE 2 %-1:100000 IJ SOLN
INTRAMUSCULAR | Status: AC
  Filled 2016-05-21: qty 10.2

## 2016-05-21 MED ORDER — FENTANYL CITRATE (PF) 100 MCG/2ML IJ SOLN: 25.0000 ug | INTRAMUSCULAR | Status: DC | PRN

## 2016-05-21 MED ORDER — FENTANYL CITRATE (PF) 100 MCG/2ML IJ SOLN
INTRAMUSCULAR | Status: AC
  Filled 2016-05-21: qty 4

## 2016-05-21 MED ORDER — OXYMETAZOLINE HCL 0.05 % NA SOLN
NASAL | Status: AC
  Filled 2016-05-21: qty 15

## 2016-05-21 MED ORDER — HEMOSTATIC AGENTS (NO CHARGE) OPTIME
TOPICAL | Status: DC | PRN
  Administered 2016-05-21: 1 via TOPICAL

## 2016-05-21 MED ORDER — CEFAZOLIN SODIUM-DEXTROSE 2-4 GM/100ML-% IV SOLN
2.0000 g | Freq: Once | INTRAVENOUS | Status: AC
  Administered 2016-05-21: 2 g via INTRAVENOUS
  Filled 2016-05-21: qty 100

## 2016-05-21 MED ORDER — ONDANSETRON HCL 4 MG/2ML IJ SOLN
INTRAMUSCULAR | Status: AC
  Filled 2016-05-21: qty 2

## 2016-05-21 MED ORDER — PROPOFOL 10 MG/ML IV BOLUS
INTRAVENOUS | Status: DC | PRN
  Administered 2016-05-21: 100 mg via INTRAVENOUS

## 2016-05-21 MED ORDER — BUPIVACAINE-EPINEPHRINE (PF) 0.5% -1:200000 IJ SOLN
INTRAMUSCULAR | Status: AC
  Filled 2016-05-21: qty 3.6

## 2016-05-21 MED ORDER — ONDANSETRON HCL 4 MG/2ML IJ SOLN
INTRAMUSCULAR | Status: DC | PRN
  Administered 2016-05-21: 4 mg via INTRAVENOUS

## 2016-05-21 MED ORDER — LACTATED RINGERS IV SOLN: INTRAVENOUS | Status: DC

## 2016-05-21 MED ORDER — PHENYLEPHRINE 40 MCG/ML (10ML) SYRINGE FOR IV PUSH (FOR BLOOD PRESSURE SUPPORT)
PREFILLED_SYRINGE | INTRAVENOUS | Status: AC
  Filled 2016-05-21: qty 10

## 2016-05-21 MED ORDER — 0.9 % SODIUM CHLORIDE (POUR BTL) OPTIME
TOPICAL | Status: DC | PRN
  Administered 2016-05-21: 1000 mL

## 2016-05-21 MED ORDER — ALBUTEROL SULFATE HFA 108 (90 BASE) MCG/ACT IN AERS
INHALATION_SPRAY | RESPIRATORY_TRACT | Status: DC | PRN
  Administered 2016-05-21: 4 via RESPIRATORY_TRACT

## 2016-05-21 MED ORDER — SUCCINYLCHOLINE CHLORIDE 20 MG/ML IJ SOLN
INTRAMUSCULAR | Status: DC | PRN
  Administered 2016-05-21: 120 mg via INTRAVENOUS

## 2016-05-21 MED ORDER — LACTATED RINGERS IV SOLN
INTRAVENOUS | Status: DC | PRN
  Administered 2016-05-21: 08:00:00 via INTRAVENOUS

## 2016-05-21 SURGICAL SUPPLY — 36 items
ALCOHOL 70% 16 OZ (MISCELLANEOUS) ×3 IMPLANT
ATTRACTOMAT 16X20 MAGNETIC DRP (DRAPES) ×3 IMPLANT
BLADE SURG 15 STRL LF DISP TIS (BLADE) ×2 IMPLANT
BLADE SURG 15 STRL SS (BLADE) ×4
COVER SURGICAL LIGHT HANDLE (MISCELLANEOUS) ×3 IMPLANT
GAUZE PACKING FOLDED 2  STR (GAUZE/BANDAGES/DRESSINGS) ×2
GAUZE PACKING FOLDED 2 STR (GAUZE/BANDAGES/DRESSINGS) ×1 IMPLANT
GAUZE SPONGE 4X4 16PLY XRAY LF (GAUZE/BANDAGES/DRESSINGS) ×3 IMPLANT
GLOVE BIOGEL PI IND STRL 6 (GLOVE) ×1 IMPLANT
GLOVE BIOGEL PI INDICATOR 6 (GLOVE) ×2
GLOVE SURG ORTHO 8.0 STRL STRW (GLOVE) ×3 IMPLANT
GLOVE SURG SS PI 6.0 STRL IVOR (GLOVE) ×3 IMPLANT
GOWN STRL REUS W/ TWL LRG LVL3 (GOWN DISPOSABLE) ×1 IMPLANT
GOWN STRL REUS W/TWL 2XL LVL3 (GOWN DISPOSABLE) ×3 IMPLANT
GOWN STRL REUS W/TWL LRG LVL3 (GOWN DISPOSABLE) ×2
HEMOSTAT SURGICEL 2X14 (HEMOSTASIS) ×3 IMPLANT
KIT BASIN OR (CUSTOM PROCEDURE TRAY) ×3 IMPLANT
KIT ROOM TURNOVER OR (KITS) ×3 IMPLANT
MANIFOLD NEPTUNE WASTE (CANNULA) ×3 IMPLANT
NEEDLE BLUNT 16X1.5 OR ONLY (NEEDLE) ×3 IMPLANT
NS IRRIG 1000ML POUR BTL (IV SOLUTION) ×3 IMPLANT
PACK EENT II TURBAN DRAPE (CUSTOM PROCEDURE TRAY) ×3 IMPLANT
PAD ARMBOARD 7.5X6 YLW CONV (MISCELLANEOUS) ×3 IMPLANT
SPONGE SURGIFOAM ABS GEL 100 (HEMOSTASIS) IMPLANT
SPONGE SURGIFOAM ABS GEL 12-7 (HEMOSTASIS) IMPLANT
SPONGE SURGIFOAM ABS GEL SZ50 (HEMOSTASIS) IMPLANT
SUCTION FRAZIER HANDLE 10FR (MISCELLANEOUS) ×2
SUCTION TUBE FRAZIER 10FR DISP (MISCELLANEOUS) ×1 IMPLANT
SUT CHROMIC 3 0 PS 2 (SUTURE) ×3 IMPLANT
SUT CHROMIC 4 0 P 3 18 (SUTURE) IMPLANT
SYR 50ML SLIP (SYRINGE) ×3 IMPLANT
TOWEL OR 17X26 10 PK STRL BLUE (TOWEL DISPOSABLE) ×3 IMPLANT
TUBE CONNECTING 12'X1/4 (SUCTIONS) ×1
TUBE CONNECTING 12X1/4 (SUCTIONS) ×2 IMPLANT
WATER TABLETS ICX (MISCELLANEOUS) ×3 IMPLANT
YANKAUER SUCT BULB TIP NO VENT (SUCTIONS) ×3 IMPLANT

## 2016-05-21 NOTE — Op Note (Signed)
OPERATIVE REPORT  Patient:            [name] [name] Date of Birth:  1940-12-09 MRN:                [id number]   DATE OF PROCEDURE:  05/21/2016  PREOPERATIVE DIAGNOSES: 1. Severe mitral regurgitation 2. Pre-heart valve surgery dental protocol 3. Retained root segment 4. Abfraction lesion 4. Chronic periodontitis 5. Accretions  POSTOPERATIVE DIAGNOSES: 1. Severe mitral regurgitation 2. Pre-heart valve surgery dental protocol 3. Retained root segment 4. Abfraction lesion 4. Chronic periodontitis 5. Accretions  OPERATIONS: 1. Multiple extraction of tooth numbers 5 and 21 with alveoloplasty 2. Gross debridement of remaining dentition   SURGEON: Lenn Cal, DDS  ASSISTANT: Camie Patience, (dental assistant)  ANESTHESIA: General anesthesia via oral endotracheal tube.  MEDICATIONS: 1. Ancef 2 g IV prior to invasive dental procedures. 2. Local anesthesia with a total utilization of 1 carpules each containing 34 mg of lidocaine with 0.017 mg of epinephrine as well as 2 carpules each containing 9 mg of bupivacaine with 0.009 mg of epinephrine.  SPECIMENS: There are 2 teeth that were discarded.  DRAINS: None  CULTURES: None  COMPLICATIONS: None   ESTIMATED BLOOD LOSS: less than 50 mls.  INTRAVENOUS FLUIDS: 700 mLs of Lactated ringers solution.  INDICATIONS: The patient was recently diagnosed with severe mitral regurgitation.  A medically necessary dental consultation was then requested to evaluate poor dentition.  The patient was examined and treatment planned for multiple extractions with alveoloplasty and gross debridement of remaining dentition in the operating room with general anesthesia..  This treatment plan was formulated to decrease the risks and complications associated with dental infection from affecting the patient's systemic health and anticipated heart valve surgery.  OPERATIVE FINDINGS: Patient was examined operating room number 10.  The teeth  were identified for extraction. The patient was noted be affected by chronic periodontitis, accretions, retained root segment, and abfraction lesion.   DESCRIPTION OF PROCEDURE: Patient was brought to the main operating room number 10. Patient was then placed in the supine position on the operating table. General anesthesia was then induced per the anesthesia team. The patient was then prepped and draped in the usual manner for dental medicine procedure. A timeout was performed. The patient was identified and procedures were verified. A throat pack was placed at this time. The oral cavity was then thoroughly examined with the findings noted above. The patient was then ready for dental medicine procedure as follows:  Local anesthesia was then administered sequentially with a total utilization of 1 carpules each containing 34 mg of lidocaine with 0.017 mg of epinephrine as well as 2 carpules  each containing 9 mg bupivacaine with 0.009 mg of epinephrine.  The Maxillary right quadrant was first approached. Anesthesia was then delivered utilizing infiltration with lidocaine with epinephrine. A #15 blade incision was then made on the buccal and palatal aspects around tooth #5 .  A  surgical flap was then carefully reflected. Appropriate amounts of buccal and interseptal bone were then removed around tooth #5 using a surgical handpiece and bur and copious amounts of sterile water.  Tooth #5 was then subluxated and removed with a 150 forceps without complications. The socket was then curetted and compressed appropriately. Tissues were approximated and trimmed appropriately. The surgical site was then irrigated with copious amounts of sterile saline. Piece of Surgicel is placed in the extraction socket. The surgical site was then closed from the mesial of #4 and extended the  distal of #6 utilizing 3-0 chromic gut suture in a continuous interrupted suture technique 1.  At this point time, the mandibular left  quadrant was approached. The patient was given an inferior alveolar nerve block and long buccal nerve block utilizing the bupivacaine with epinephrine. Further infiltration was then achieved utilizing the bupivacaine with epinephrine. A 15 blade incision was then made from the distal of number 19 and extended to the mesial of #22.  A surgical flap was then carefully reflected. Appropriate amounts of buccal and interseptal bone were then removed utilizing a surgical handpiece and copious amount of sterile water around tooth #21. Tooth number 21 was then subluxated and removed with a 151 forceps without complications. Alveoloplasty was then performed utilizing a rongeurs and bone file to help achieve primary closure. The tissues were approximated and trimmed appropriately. The surgical sites were then irrigated with copious amounts of sterile saline. A piece of Surgicel was then placed in the extraction socket. The mandibular left surgical site was then closed from the distal of 19 and extended to the distal of #22 utilizing 3-0 chromic gut suture in a continuous interrupted to suture technique 1.   The remaining teeth were then approached. A sonic scaler was used to remove accretions. A series of hand curettes were used to further remove accretions. A sonic scaler was then again used to further refine removal of accretions. This completed the gross debridement procedure.  At this point time, the entire mouth was irrigated with copious amounts of sterile saline. The patient was examined for complications, seeing none, the dental medicine procedure was deemed to be complete. The throat pack was removed at this time. An oral airway was then placed at the request of the anesthesia team. A series of 4 x 4 gauze moistened with Amicar 5% rinse were placed in the mouth to aid hemostasis. The patient was then handed over to the anesthesia team for final disposition. After an appropriate amount of time, the patient was  extubated and taken to the postanesthsia care unit in good condition. All counts were correct for the dental medicine procedure.  The patient is to continue the Amicar 5% rinse post operatively. Patient is to rinse with 10 mL's every hour for the next 10 hours in a swish and spit manner. The patient is to restart her Xarelto therapy tomorrow evening.      Lenn Cal, DDS.

## 2016-05-21 NOTE — Anesthesia Procedure Notes (Signed)
Procedure Name: Intubation Date/Time: 05/21/2016 8:27 AM Performed by: Rejeana Brock L Pre-anesthesia Checklist: Patient identified, Emergency Drugs available, Suction available and Patient being monitored Patient Re-evaluated:Patient Re-evaluated prior to inductionOxygen Delivery Method: Circle System Utilized Preoxygenation: Pre-oxygenation with 100% oxygen Intubation Type: IV induction Ventilation: Mask ventilation without difficulty Laryngoscope Size: Mac and 4 Grade View: Grade I Tube type: Oral Tube size: 7.0 mm Number of attempts: 1 Airway Equipment and Method: Stylet and Oral airway Placement Confirmation: ETT inserted through vocal cords under direct vision,  positive ETCO2 and breath sounds checked- equal and bilateral Secured at: 21 cm Tube secured with: Tape Dental Injury: Teeth and Oropharynx as per pre-operative assessment

## 2016-05-21 NOTE — Progress Notes (Signed)
PRE-OPERATIVE NOTE:  05/21/2016 [name] [name] [id number]  VITALS: BP 113/82   Pulse 96   Temp 97.9 F (36.6 C) (Oral)   Resp 18   Ht 5' 2.5" (1.588 m)   Wt 158 lb (71.7 kg)   SpO2 98%   BMI 28.44 kg/m   Results from CBC, BMET, and INR drawn today are pending. .  CBC    Component Value Date/Time   WBC 5.9 05/21/2016 0730   RBC 3.70 (L) 05/21/2016 0730   HGB 11.1 (L) 05/21/2016 0730   HCT 35.1 (L) 05/21/2016 0730   PLT 220 05/21/2016 0730   MCV 94.9 05/21/2016 0730   MCH 30.0 05/21/2016 0730   MCHC 31.6 05/21/2016 0730   RDW 13.2 05/21/2016 0730     BMET BMET    Component Value Date/Time   NA 143 05/21/2016 0730   K 4.0 05/21/2016 0730   CL 106 05/21/2016 0730   CO2 29 05/21/2016 0730   GLUCOSE 97 05/21/2016 0730   BUN 11 05/21/2016 0730   CREATININE 0.73 05/21/2016 0730   CALCIUM 8.9 05/21/2016 0730   GFRNONAA >60 05/21/2016 0730   GFRAA >60 05/21/2016 0730     Lab Results  Component Value Date   INR 1.06 05/21/2016   INR 1.05 04/12/2016   No results found for: PTT   [name] [name] presents for multiple dental extractions with alveoloplasty and gross debridement of teeth in the OR with general anesthesia.   SUBJECTIVE: The patient denies any acute medical or dental changes and agrees to proceed with treatment as planned. Patient discontinued Xarelto  Last Friday.  EXAM: No sign of acute dental changes.  ASSESSMENT: Patient is affected by retained roots, distal caries, abfraction lesions, chronic periodontitis, and accretions.  PLAN: Patient agrees to proceed with treatment as planned in the operating room as previously discussed and accepts the risks, benefits, and complications of the proposed treatment. Patient is aware of the risk for bleeding, bruising, swelling, infection, pain, nerve damage, sinus involvement, soft tissue involvement, damage to adjacent teeth, root tip fracture, mandible fracture, and the risks of complications  associated with the anesthesia. Patient also is aware of the potential for other complications up to and including death due to overall cardiovascular and respiratory compromise.     Lenn Cal, DDS

## 2016-05-21 NOTE — Discharge Instructions (Signed)

## 2016-05-21 NOTE — Anesthesia Postprocedure Evaluation (Signed)
Anesthesia Post Note  Patient: [name] [name]  Procedure(s) Performed: Procedure(s) (LRB): MULTIPLE EXTRACTION OF TOOTH #'S 5, 21 WITH ALVEOLOPLASTY AND GROSS DEBRIDEMENT OF TEETH (N/A)  Patient location during evaluation: PACU Anesthesia Type: General Level of consciousness: awake and alert and oriented Pain management: pain level controlled Vital Signs Assessment: post-procedure vital signs reviewed and stable Respiratory status: nonlabored ventilation, respiratory function stable and spontaneous breathing Cardiovascular status: blood pressure returned to baseline and stable Postop Assessment: no signs of nausea or vomiting Anesthetic complications: no       Last Vitals:  Vitals:   05/21/16 1100 05/21/16 1115  BP: 123/66   Pulse: 89 95  Resp: 17 17  Temp:      Last Pain:  Vitals:   05/21/16 1000  TempSrc:   PainSc: 0-No pain                 Janaisa Birkland A.

## 2016-05-21 NOTE — Transfer of Care (Signed)
Immediate Anesthesia Transfer of Care Note  Patient: [name] [name]  Procedure(s) Performed: Procedure(s): MULTIPLE EXTRACTION OF TOOTH #'S 5, 21 WITH ALVEOLOPLASTY AND GROSS DEBRIDEMENT OF TEETH (N/A)  Patient Location: PACU  Anesthesia Type:General  Level of Consciousness: awake  Airway & Oxygen Therapy: Patient Spontanous Breathing and Patient connected to face mask oxygen  Post-op Assessment: Report given to RN, Post -op Vital signs reviewed and stable and Patient moving all extremities X 4  Post vital signs: Reviewed and stable  Last Vitals:  Vitals:   05/21/16 0658 05/21/16 0659  BP: (!) 98/45 113/82  Pulse: 96   Resp: 18   Temp: 36.6 C     Last Pain:  Vitals:   05/21/16 0658  TempSrc: Oral         Complications: No apparent anesthesia complications

## 2016-05-21 NOTE — H&P (Signed)
05/21/2016  Patient:            [name] [name] Date of Birth:  24-Jun-1940 MRN:                [id number]   BP 113/82   Pulse 96   Temp 97.9 F (36.6 C) (Oral)   Resp 18   Ht 5' 2.5" (1.588 m)   Wt 158 lb (71.7 kg)   SpO2 98%   BMI 28.44 kg/m   TASHAWNA THOM is a 76 year old female that presents for multiple dental extractions with alveoloplasty and gross debridement of teeth in the OR with GA.  Patient denies acute medical or dental problems.  Please use Dr. Guy Sandifer note of 04/27/16 to act as H and P for dental OR procedure.  Lenn Cal, DDS    Progress Notes   Encounter Date: 04/27/2016 11:00 AM       Rexene Alberts, MD   Cardiothoracic Surgery       [address]        [name] [address]              [phone]                     CARDIOTHORACIC SURGERY CONSULTATION REPORT     Referring Provider is Branch, Alphonse Guild, MD  PCP is Glenda Chroman, MD          Chief Complaint    Patient presents with    .   Mitral Regurgitation            SEVERE...ECHO 01/25/16, CATH 04/12/16          HPI:     Patient is a 76 year old female with mitral regurgitation, atrial fibrillation on chronic anticoagulation, chronic diastolic congestive heart failure, COPD, hyperlipidemia, remote history of colon cancer, and more recent history of mild intermittent rectal bleeding attributed to hemorrhoids who has been referred for surgical consultation to discuss treatment options for management of severe mitral regurgitation.  The patient states that she has a long history of symptoms of exertional shortness breath dating back many years. She is a nonsmoker who has been diagnosed with COPD, although she was exposed to secondhand smoke by her husband who smokes a lot. Approximately 3 years ago she was noted to have an irregular heart rhythm and diagnosed with atrial fibrillation. She underwent an echocardiogram and was found to have  mitral regurgitation. She was started on Xarelto and followed for several years by a cardiologist with [facility].  Her cardiologist moved away and she was referred to Dr. [name] in September 2017 because of worsening symptoms of shortness of breath.  At that time she was noted to be in rate controlled persistent atrial fibrillation.  It is unclear how long it has been since she was in sinus rhythm, but she has never been treated with antiarrhythmic agents nor had DC cardioversion performed.  Follow-up echocardiogram performed 01/25/2016 revealed normal left ventricular systolic function with an eccentric jet of mitral regurgitation that was reported as moderate to severe with associated severe left atrial enlargement and moderate to severe tricuspid regurgitation.  She subsequently underwent TEE on 02/20/2016 that revealed mitral valve prolapse with a very eccentric jet of regurgitation and moderate to severe mitral regurgitation with normal left ventricular systolic function, ejection fraction estimated 60-65%, severe left atrial enlargement, and severe tricuspid regurgitation.  Left and right heart catheterization was performed 04/12/2016 and revealed mild  nonobstructive coronary artery disease with mild pulmonary hypertension and severe (4+) mitral regurgitation.  The patient was referred for elective surgical consultation.     The patient is married and lives with her husband in [place], [place].  She has been retired for nearly 20 years, having previously worked in the [employer]. She lives a sedentary lifestyle. She states that she does not exercise at all. She complains of progressive symptoms of exertional shortness breath and fatigue. She has had some degree of shortness of breath for many years, and over the past 3-6 months her symptoms have gotten much worse. She now gets short of breath with mild activity and this limits her daily activities considerably.  She denies any recent history of resting  shortness of breath. She occasionally gets short of breath at night but typically she can sleep on one pillow. She has had some mild dizzy spells and lower extremity edema. She has not had any exertional chest pain but she does report some tightness across her chest when she is short of breath. She has intermittent dry cough and frequent wheezing. This is been attributed to history of asthma. She has mild arthritis but no problems with ambulation. She states that her physical activities are limited primarily by shortness of breath. She has not seen a dentist in at least 3 or 4 years. She denies any loose teeth or painful teeth at present. She does not have dentures.             Past Medical History:    Diagnosis   Date    .   Asthma        .   Atrial fibrillation, persistent ([place])        .   Colon adenoma        .   Colon cancer ([place])            status post low anterior resection, limited stage disease requiring no adjuvant therapy    .   Diverticulosis        .   DM (dermatomyositis)        .   Dysrhythmia        .   Esophageal dysphagia        .   GERD (gastroesophageal reflux disease)        .   Hematuria        .   Hemorrhoids        .   Hiatal hernia        .   Hypercholesterolemia        .   Mitral regurgitation        .   PONV (postoperative nausea and vomiting)        .   Schatzki's ring        .   Tricuspid regurgitation                    Past Surgical History:    Procedure   Laterality   Date    .   APPENDECTOMY            .   CARDIAC CATHETERIZATION   N/A   04/12/2016        Procedure: Right/Left Heart Cath and Coronary Angiography;  Surgeon: Leonie Man, MD;  Location: [facility];  Service: Cardiovascular;  Laterality: N/A;    .   COLONOSCOPY       11/09  Dr. Gala Romney- external hemorrhoidal tags o/w  normal rectal mucosa, s/p surgical resection with a normal appearing anastomosis 12cm, pan colonic diverticulum    .   COLONOSCOPY   N/A   06/02/2012        ZOX:WRUEAV post low anterior resection. Pancolonic diverticulosis. Colonic polyp-tubular adenoma. Surveillance due 2019.     Marland Kitchen   COLONOSCOPY   N/A   10/13/2015        Procedure: COLONOSCOPY;  Surgeon: Daneil Dolin, MD;  Location: [name];  Service: Endoscopy;  Laterality: N/A;  0930    .   ESOPHAGOGASTRODUODENOSCOPY       07/2007        Dr. Daiva Nakayama cervical esophageal web, schatzki ring, large hiatal hernia    .   INGUNAL HERNIA REPAIR            .   LOW ANTERIOR BOWEL RESECTION                NO ADJ CHEMO    .   TEE WITHOUT CARDIOVERSION   N/A   02/20/2016        Procedure: TRANSESOPHAGEAL ECHOCARDIOGRAM (TEE);  Surgeon: Arnoldo Lenis, MD;  Location: [name];  Service: Endoscopy;  Laterality: N/A;    .   TUBAL LIGATION            .   VEIN LIGATION AND STRIPPING                        Family History    Problem   Relation   Age of Onset    .   Breast cancer   Mother        .   Rheum arthritis   Father        .   Cancer - Lung   Sister        .   Brain cancer   Sister        .   Prostate cancer   Brother        .   Aneurysm   Brother        .   Colon cancer   Neg Hx               Social History             Social History    .   Marital status:   Married            Spouse name:   N/A    .   Number of children:   N/A    .   Years of education:   N/A           Occupational History    .   Not on file.              Social History Main Topics    .   Smoking status:   Never Smoker    .   Smokeless tobacco:   Never Used    .   Alcohol use   0.0 oz/week                Comment: Rarely     .   Drug use:   No    .   Sexual activity:   Not on file            Other Topics   Concern    .  Not on file           Social History Narrative    .   No narrative on file                 Current Outpatient Prescriptions    Medication   Sig   Dispense   Refill    .   acetaminophen (TYLENOL) 325 MG tablet   Take 650 mg by mouth every 6 (six) hours as needed for pain.            Marland Kitchen   albuterol (PROVENTIL HFA;VENTOLIN HFA) 108 (90 BASE) MCG/ACT inhaler   Inhale 2 puffs into the lungs every 6 (six) hours as needed for wheezing.            Marland Kitchen   ALPRAZolam (XANAX) 0.5 MG tablet   Take 0.5 mg by mouth 2 (two) times daily as needed (for anxiety/sleep (scheduled at bedtime)).             .   calcium carbonate (OS-CAL) 600 MG TABS tablet   Take 600 mg by mouth 2 (two) times daily with a meal.            .   Coenzyme Q10 100 MG TABS   Take 100 mg by mouth every evening.             .   docusate sodium (COLACE) 100 MG capsule   Take 100-200 mg by mouth daily as needed for mild constipation.            .   metoprolol succinate (TOPROL-XL) 50 MG 24 hr tablet   Take 1 tablet (50 mg total) by mouth daily.   90 tablet   3    .   Multiple Vitamin (MULTIVITAMIN) tablet   Take 1 tablet by mouth daily.              Marland Kitchen   omeprazole (PRILOSEC) 20 MG capsule   Take 1 capsule (20 mg total) by mouth daily.   30 capsule   3    .   PARoxetine (PAXIL) 20 MG tablet   Take 20 mg by mouth daily.             Vladimir Faster Glycol-Propyl Glycol (SYSTANE ULTRA) 0.4-0.3 % SOLN   Place 1 drop into both eyes 3 (three) times daily.            .   ranitidine (ZANTAC) 150 MG tablet   Take one tab ('150mg'$ ) at 6:00 pm the evening prior to heart cath, one tab at midnight, and take the remaining tab with you to the hospital the morning of.   3 tablet   0    .    rivaroxaban (XARELTO) 20 MG TABS tablet   Take 1 tablet (20 mg total) by mouth daily with supper. (Patient taking differently: Take 20 mg by mouth daily with supper. Has stopped for procedure)   28 tablet   0    .   simvastatin (ZOCOR) 40 MG tablet   Take 40 mg by mouth at bedtime.             .   sodium chloride (OCEAN) 0.65 % SOLN nasal spray   Place 1 spray into both nostrils as needed for congestion.            .   traMADol (ULTRAM) 50 MG tablet   Take 50 mg by mouth every 12 (twelve) hours  as needed for moderate pain.                 No current facility-administered medications for this visit.                 Allergies    Allergen   Reactions    .   Hydrocodone-Acetaminophen   Hives    .   Iohexol                 Code: HIVES, Desc: pt. had a severe allergic reaction to IV contrast the last time she was injected and had to be seen in the ER.  Hives and sob.       .   Nabumetone   Rash    .   Prednisone   Rash                Review of Systems:                 General:                      normal appetite, decreased energy, no weight gain, no weight loss, no fever              Cardiac:                       no chest pain with exertion, no chest pain at rest, + SOB with exertion, occasional resting SOB, + occasional PND, no orthopnea, no palpitations, + arrhythmia, + atrial fibrillation, + LE edema, + dizzy spells, no syncope              Respiratory:                 + shortness of breath, no home oxygen, no productive cough, + dry cough, no bronchitis, + wheezing, no hemoptysis, + asthma, no pain with inspiration or cough, no sleep apnea, no CPAP at night              GI:                               mild difficulty swallowing, + reflux, no frequent heartburn, + hiatal hernia, no abdominal pain, no constipation, no diarrhea, + hematochezia, no hematemesis, no melena              GU:                               no dysuria,  no frequency, no urinary tract infection, no hematuria, no kidney stones, no kidney disease              Vascular:                     no pain suggestive of claudication, no pain in feet, + leg cramps, + varicose veins, no DVT, no non-healing foot ulcer              Neuro:                         no stroke, no TIA's, no seizures, + headaches, no temporary blindness one eye,  no slurred speech, no peripheral neuropathy, no chronic pain, no instability of gait, mild memory/cognitive dysfunction  Musculoskeletal:         mild arthritis, mild joint swelling, no myalgias, no difficulty walking, normal mobility               Skin:                            no rash, no itching, no skin infections, no pressure sores or ulcerations              Psych:                         + anxiety, + depression, + nervousness, no unusual recent stress              Eyes:                           no blurry vision, + floaters, no recent vision changes, + wears glasses or contacts              ENT:                            no hearing loss, no loose or painful teeth, no dentures, last saw dentist 3 or 4 years ago              Hematologic:               + easy bruising, no abnormal bleeding, no clotting disorder, no frequent epistaxis              Endocrine:                   no diabetes, does not check CBG's at home                               Physical Exam:                 BP 130/87 (BP Location: Right Arm, Patient Position: Sitting, Cuff Size: Large)   Pulse 95   Resp 18   Ht '5\' 3"'$  (1.6 m)   Wt 155 lb (70.3 kg)   SpO2 98% Comment: ON RA  BMI 27.46 kg/m               General:                      Moderately obese female NAD              HEENT:                       Unremarkable               Neck:                           no JVD, no bruits, no adenopathy               Chest:                          clear to auscultation, symmetrical breath sounds,  no wheezes, no rhonchi               CV:  Irregular rate and rhythm, grade III/VI systolic murmur best LLSB              Abdomen:                    soft, non-tender, no masses               Extremities:                 warm, well-perfused, pulses not palpable, trace LE edema, + varicose veins              Rectal/GU                   Deferred              Neuro:                         Grossly non-focal and symmetrical throughout              Skin:                            Clean and dry, no rashes, no breakdown, + mild changes venous insufficiency both lower legs        Diagnostic Tests:     Transthoracic Echocardiography     Patient:    Monay, Houlton  MR #:       [id number]  Study Date: 01/25/2016  Gender:     F  Age:        89  Height:     160 cm  Weight:     71.7 kg  BSA:        1.81 m^2  Pt. Status:  Room:      SONOGRAPHER  [facility]   ATTENDING    Kerry Hough, M.D.   Berna Spare, M.D.   REFERRING    Kerry Hough, M.D.   PERFORMING   [facility]     cc:     -------------------------------------------------------------------  LV EF: 60% -   65%     -------------------------------------------------------------------  History:   PMH:   Murmur.  Atrial fibrillation.  Mitral valve  disease.  Risk factors:  Diabetes mellitus. Dyslipidemia.     -------------------------------------------------------------------  Study Conclusions     - Left ventricle: The cavity size was normal. Wall thickness was    normal. Systolic function was normal. The estimated ejection    fraction was in the range of 60% to 65%. Wall motion was normal;    there were no regional wall motion abnormalities. The study is    not technically sufficient to allow evaluation of LV diastolic    function. Doppler parameters are consistent with high ventricular    filling pressure.  - Mitral valve:  Calcified annulus. There was moderate to severe    (closer to severe) eccentric regurgitation.  - Left atrium: The atrium was severely dilated.  - Right atrium: The atrium was moderately dilated.  - Tricuspid valve: There was moderate-severe regurgitation.  - Pulmonary arteries: PA peak pressure: 40 mm Hg (S).     -------------------------------------------------------------------  Labs, prior tests, procedures, and surgery:  Echocardiography (January 2017).    The mitral valve showed  moderate to severe regurgitation.  EF was 65%.     -------------------------------------------------------------------  Study data:  The previous study was not available, so comparison  was made  to the report of January 2017.  Study status:  Routine.  Procedure:  Transthoracic echocardiography. Image quality was  adequate.  Study completion:  There were no complications.  Transthoracic echocardiography.  M-mode, complete 2D, spectral  Doppler, and color Doppler.  Birthdate:  Patient birthdate:  September 25, 1940.  Age:  Patient is 76 yr old.  Sex:  Gender: female.  BMI: 28 kg/m^2.  Blood pressure:     106/70  Patient status:  Outpatient.  Study date:  Study date: 01/25/2016. Study time: 09:48  AM.     -------------------------------------------------------------------     -------------------------------------------------------------------  Left ventricle:  The cavity size was normal. Wall thickness was  normal. Systolic function was normal. The estimated ejection  fraction was in the range of 60% to 65%. Wall motion was normal;  there were no regional wall motion abnormalities. The study is not  technically sufficient to allow evaluation of LV diastolic  function. Doppler parameters are consistent with high ventricular  filling pressure.     -------------------------------------------------------------------  Aortic valve:   Trileaflet.  Doppler:   There was no  stenosis.  There was no regurgitation.     -------------------------------------------------------------------  Aorta:  Aortic root: The aortic root was normal in size.  Ascending aorta: The ascending aorta was normal in size.     -------------------------------------------------------------------  Mitral valve:   Calcified annulus.  Doppler:  There was moderate to  severe (closer to severe) eccentric regurgitation.    Peak gradient  (D): 10 mm Hg.     -------------------------------------------------------------------  Left atrium:  The atrium was severely dilated.     -------------------------------------------------------------------  Atrial septum:  No defect or patent foramen ovale was identified.     -------------------------------------------------------------------  Right ventricle:  The cavity size was normal. Wall thickness was  normal. Systolic function was normal.     -------------------------------------------------------------------  Pulmonic valve:   Poorly visualized.  Doppler:  There was no  significant regurgitation.     -------------------------------------------------------------------  Tricuspid valve:   Normal thickness leaflets.  Doppler:  There was  moderate-severe regurgitation.     -------------------------------------------------------------------  Right atrium:  The atrium was moderately dilated.     -------------------------------------------------------------------  Pericardium:  There was no pericardial effusion.     -------------------------------------------------------------------  Systemic veins:  Inferior vena cava: The vessel was normal in size. The  respirophasic diameter changes were in the normal range (>= 50%),  consistent with normal central venous pressure.     -------------------------------------------------------------------  Measurements      Left ventricle                            Value        Reference   LV ID, ED, PLAX chordal                  45    mm     43 - 52   LV ID, ES, PLAX chordal                  30.7  mm     23 - 38   LV fx shortening, PLAX chordal           32    %      >=29   LV PW thickness, ED                      10    mm     ---------  IVS/LV PW ratio, ED                      0.75         <=1.3   Stroke volume, 2D                        64    ml     ---------   Stroke volume/bsa, 2D                    35    ml/m^2 ---------   LV ejection fraction, 1-p A4C            64    %      ---------   LV end-diastolic volume, 2-p             52    ml     ---------   LV end-systolic volume, 2-p              21    ml     ---------   LV ejection fraction, 2-p                60    %      ---------   Stroke volume, 2-p                       31    ml     ---------   LV end-diastolic volume/bsa, 2-p         29    ml/m^2 ---------   LV end-systolic volume/bsa, 2-p          12    ml/m^2 ---------   Stroke volume/bsa, 2-p                   17.3  ml/m^2 ---------   LV e&', lateral                           10.1  cm/s   ---------   LV E/e&', lateral                         15.84        ---------   LV e&', medial                            10.1  cm/s   ---------   LV E/e&', medial                          15.84        ---------   LV e&', average                           10.1  cm/s   ---------   LV E/e&', average                         15.84        ---------      Ventricular septum                       Value        Reference   IVS thickness, ED  7.51  mm     ---------      LVOT                                     Value        Reference   LVOT ID, S                               19    mm     ---------   LVOT area                                2.84  cm^2   ---------   LVOT peak velocity, S                    118   cm/s   ---------   LVOT mean velocity, S                    82.2  cm/s   ---------   LVOT VTI, S                               22.5  cm     ---------   LVOT peak gradient, S                    6     mm Hg  ---------      Aorta                                    Value        Reference   Aortic root ID, ED                       31    mm     ---------      Left atrium                              Value        Reference   LA ID, A-P, ES                           48    mm     ---------   LA ID/bsa, A-P                   (H)     2.66  cm/m^2 <=2.2   LA volume, S                             112   ml     ---------   LA volume/bsa, S                         62    ml/m^2 ---------   LA volume, ES, 1-p A4C                   109   ml     ---------  LA volume/bsa, ES, 1-p A4C               60.4  ml/m^2 ---------   LA volume, ES, 1-p A2C                   107   ml     ---------   LA volume/bsa, ES, 1-p A2C               59.3  ml/m^2 ---------      Mitral valve                             Value        Reference   Mitral E-wave peak velocity              160   cm/s   ---------   Mitral A-wave peak velocity              99.1  cm/s   ---------   Mitral deceleration time         (L)     106   ms     150 - 230   Mitral peak gradient, D                  10    mm Hg  ---------   Mitral E/A ratio, peak                   1.6          ---------   Mitral regurg VTI, PISA                  151   cm     ---------   Mitral ERO, PISA                         0.05  cm^2   ---------   Mitral regurg volume, PISA               8     ml     ---------      Pulmonary arteries                       Value        Reference   PA pressure, S, DP               (H)     40    mm Hg  <=30      Tricuspid valve                          Value        Reference   Tricuspid regurg peak velocity           305   cm/s   ---------   Tricuspid peak RV-RA gradient            37    mm Hg  ---------      Systemic veins                           Value        Reference   Estimated CVP  3     mm Hg  ---------      Right ventricle                          Value        Reference   TAPSE                                    22.9  mm     ---------   RV pressure, S, DP               (H)     40    mm Hg  <=30     Legend:  (L)  and  (H)  mark values outside specified reference range.     -------------------------------------------------------------------  Prepared and Electronically Authenticated by     Kate Sable, MD  2017-10-04T12:01:15        Transesophageal Echocardiography     Patient:    Clydean, Posas  MR #:       [id number]  Study Date: 02/20/2016  Gender:     F  Age:        78  Height:     160 cm  Weight:     72.3 kg  BSA:        1.81 m^2  Pt. Status:  Room:       [facility]      ADMITTING    Kerry Hough, M.D.   ATTENDING    Kerry Hough, M.D.   Berna Spare, M.D.   REFERRING    Kerry Hough, M.D.   PERFORMING   [facility]   SONOGRAPHER  [facility], [facility]     cc:     -------------------------------------------------------------------  LV EF: 60% -   65%     -------------------------------------------------------------------  Indications:      Mitral regurgitation 424.0.     -------------------------------------------------------------------  History:   Risk factors:  Carcinoma in situ of colon, GERD.  Diabetes mellitus. Dyslipidemia.     -------------------------------------------------------------------  Study Conclusions     - Left ventricle: The cavity size was normal. Wall thickness was    increased in a pattern of mild LVH. Systolic function was normal.    The estimated ejection fraction was in the range of 60% to 65%.    There is prolapse of a portion of the anterior leaflet, appears    to be the A2 scallop.  - Mitral valve: There is prolapse of a portion of the anterior    leaflet, appears to be the A2 scallop.    The regurgitant jet is  very eccentric, not able to quantify with    PISA or vena contracta measurements. Probable moderate to severe    mitral regurgitation.  - Left atrium: The atrium was severely dilated.  - Right ventricle: The cavity size was mildly dilated. Wall    thickness was normal.  - Right atrium: The atrium was moderately to severely dilated.  - Atrial septum: No defect or patent foramen ovale was identified.  - Tricuspid valve: There was severe regurgitation.  - Transgastric images are limited, unable to obtain deep    transgastic images. TEE probe would not advance beyond 37 mm.     -------------------------------------------------------------------  Study data:   Study status:  Routine.  Consent:  The risks,  benefits, and alternatives to  the procedure were explained to the  patient and informed consent was obtained.  Procedure:  The patient  reported no pain pre or post test. Initial setup. The patient was  brought to the laboratory. Surface ECG leads were monitored.  Sedation. Conscious sedation was administered. Transesophageal  echocardiography. Topical anesthesia was obtained using viscous  lidocaine. A transesophageal probe was inserted by the attending  cardiologist. Image quality was adequate.  Study completion:  The  patient tolerated the procedure well. There were no complications.          Diagnostic transesophageal echocardiography.  2D and color  Doppler.  Birthdate:  Patient birthdate: February 17, 1941.  Age:  Patient  is 76 yr old.  Sex:  Gender: female.    BMI: 28.2 kg/m^2.  Blood  pressure:     133/74  Patient status:  Outpatient.  Study date:  Study date: 02/20/2016. Study time: 10:01 AM.  Location:  Endoscopy.     -------------------------------------------------------------------     -------------------------------------------------------------------  Left ventricle:  The cavity size was normal. Wall thickness was  increased in a pattern of  mild LVH. Systolic function was normal.  The estimated ejection fraction was in the range of 60% to 65%.  There is prolapse of a portion of the anterior leaflet, appears to  be the A2 scallop.     -------------------------------------------------------------------  Aortic valve:   Trileaflet; normal thickness leaflets.     -------------------------------------------------------------------  Aorta:  Aortic root: The aortic root was normal in size.  Ascending aorta: The ascending aorta was normal in size.     -------------------------------------------------------------------  Mitral valve:  There is prolapse of a portion of the anterior  leaflet, appears to be the A2 scallop.  The regurgitant jet is very eccentric, not able to quantify with  PISA or vena contracta measurements. Probable moderate to severe  mitral regurgitation.     -------------------------------------------------------------------  Left atrium:  The atrium was severely dilated. LAA emptying  velocity is normal at 50 cm/s.     -------------------------------------------------------------------  Atrial septum:  No defect or patent foramen ovale was identified.     -------------------------------------------------------------------  Right ventricle:  The cavity size was mildly dilated. Wall  thickness was normal. Systolic function was normal.     -------------------------------------------------------------------  Pulmonic valve:   Not well visualized.  Doppler:   There was no  evidence for stenosis.   There was no significant regurgitation.     -------------------------------------------------------------------  Tricuspid valve:   Normal thickness leaflets.  Doppler:   There was  no evidence for stenosis.   There was severe regurgitation.     -------------------------------------------------------------------  Right atrium:  The atrium was moderately to severely dilated.      -------------------------------------------------------------------  Pericardium:  There was no pericardial effusion.     -------------------------------------------------------------------  Measurements      Aorta              Value   Aortic root ID     31    mm     Legend:  (L)  and  (H)  mark values outside specified reference range.     -------------------------------------------------------------------  Prepared and Electronically Authenticated by     Kerry Hough, M.D.  2017-11-01T15:04:31          Right/Left Heart Cath and Coronary Angiography    Conclusion           .There is severe (4+) mitral regurgitation.   .Hemodynamic findings consistent with mild pulmonary hypertension.   .Angiographic minimal  coronary disease.   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   .The left ventricular ejection fraction is greater than 65% by visual estimate.      Angiographically only minimal CAD. LV gram did confirm severe MR.  Relative normal right heart cath pressures with minimal pulmonary hypertension.     Recommendation:  The patient will now be referred to cardiac surgery for consultation to discuss mitral valve repair.  TR band removal per protocol. The brachial sheath is removed in the Cath Lab.     Okay to discharge later today.       Indications        Moderate to severe mitral regurgitation [I34.0 (ICD-10-CM)]    Pre-operative cardiovascular examination, valvular heart disease [Z01.810, [email or web address] (ICD-10-CM)]    Procedural Details/Technique         Technical Details   PCP: Glenda Chroman, MD CARDIOLOGIST: Dr. [name]  76 year old woman with progression of mitral valve disease now with moderate severe MR and LV dilation. She is referred for right now for catheterization hard of preop-evaluation for possible Mitral Valve Repair.  Time Out: Verified patient identification, verified  procedure, site/side was marked, verified correct patient position, special equipment/implants available, medications/allergies/relevent history reviewed, required imaging and test results available. Performed.   Access:  RIGHT Radial Artery: 6 Fr sheath -- Seldinger technique using Angiocath Micropuncture Kit 10 mL radial cocktail IA; 4000 Units IV Heparin Right Brachial/Antecubital Vein: The existing 18-gauge IV was exchanged over a wire for a 5Fr short sheath  Right Heart Catheterization: 5 Fr Gordy Councilman catheter advanced under fluoroscopy with balloon inflated to the RA, RV, then PCWP-PA for hemodynamic measurement.  Simultaneous FA & PA blood gases checked for SaO2% to calculate FICK CO/CI  Catheter removed completely out of the body with balloon deflated.  Left Heart Catheterization: 5 Fr Catheters advanced or exchanged over a J-wire under direct fluoroscopic guidance into the ascending aorta; TIG 4.0 catheter advanced first.  LV Hemodynamics (LV Gram): Angled pigtail catheter Left Coronary Artery Cineangiography: TIG 4.0 Catheter  Right Coronary Artery Cineangiography: TIG 4.0 Catheter  Following angiography, the catheter was removed completely out of body over wire without complication.  Femoral / Brachial Sheath(s) removed in the Cath Lab with manual pressure for hemostasis.   Radial sheath removed in the Cardiac Catheterization lab with TR Band placed for hemostasis.  TR Band: 0940 Hours; 16 mL air  MEDICATIONS * 25 g IV fentanyl, 1 mgIV Versed * SQ Lidocaine 74m * Radial Cocktail: 3 mg verapamil in 10 mL NS * Isovue Contrast: 70 mL * Heparin: 4000 Units   Estimated blood loss <50 mL.  During this procedure the patient was administered the following to achieve and maintain moderate conscious sedation: Versed 1 mg, Fentanyl 25 mcg, while the patient's heart rate, blood pressure, and oxygen saturation were continuously monitored. The period of conscious sedation was 42  minutes, of which I was present face-to-face 100% of this time.       Complications        Complications documented before study signed (04/12/2016  9:53 AM EST)         No complications were associated with this study.    Documented by DLeonie Man MD - 04/12/2016  9:51 AM EST       Coronary Findings        Dominance: Co-dominant    Left Main    Vessel is large. Vessel is angiographically normal.  Left Anterior Descending    Vessel is angiographically normal.    Dist LAD lesion, 15% stenosed. The lesion is smooth. Myocardial bridging    First Diagonal Branch    Vessel is small in size. Vessel is angiographically normal.    First Septal Branch    Vessel is small in size.    Second Diagonal Branch    Vessel is moderate in size. Vessel is angiographically normal.    Second Septal Branch    Vessel is moderate in size.    Third Diagonal Branch    Vessel is small in size.    Third Septal Branch    Vessel is small in size.    Ramus Intermedius    Vessel is small.    Left Circumflex    Vessel is angiographically normal.    First Obtuse Marginal Branch    Vessel is angiographically normal.    Second Obtuse Marginal Branch    Vessel is moderate in size. Left posterolateral branch    Lateral Second Obtuse Marginal Branch    Vessel is small in size.    Third Obtuse Marginal Branch    Vessel is small in size. Actually is a left posterolateral branch    Right Coronary Artery    Vessel is large. Vessel is angiographically normal.    Acute Marginal Branch    Vessel is small in size.    Right Posterior Descending Artery    Vessel is moderate in size.    Inferior Septal    Vessel is small in size.    Right Heart         Right Heart Pressures   Hemodynamic findings consistent with mild pulmonary hypertension. PAP/mean: 37/17/26 mmHg Elevated LV EDP consistent with volume  overload. Mildly elevated at 14 mmHg. AO pressure/mean: 118/67/90 mmHg LV pressure/EDP: 122/7/14 mmHg PCWP: 15/19/13 mmHg  No shunt. PVR 217 D/S. 380 2/D/DI PA sat average 66%, AO sat 98% CARDIAC OUTPUT/INDEX: 4.79/2.74        Right Atrium   The right atrial size is normal. Right atrial pressure is normal. 9 mmHg        Right Ventricle   RV pressure/mean: 39/6/11 mmHg       Wall Motion          untitled image                untitled image       Left Heart         Left Ventricle   The left ventricular size is normal. The left ventricular systolic function is normal. LV end diastolic pressure is mildly elevated. The left ventricular ejection fraction is greater than 65% by visual estimate. No regional wall motion abnormalities.        Mitral Valve   There is severe (4+) mitral regurgitation. Cannot detect        Aortic Valve   There is no aortic valve stenosis. There is normal aortic valve motion.       Coronary Diagrams      Diagnostic Diagram        untitled image    Implants              No implant documentation for this case.    PACS Images       Show images for Cardiac catheterization      Link to Procedure Log       Procedure Log       Hemo Data  Flowsheet Row    Most Recent Value     Fick Cardiac Output   4.79 L/min    Fick Cardiac Output Index   2.74 (L/min)/BSA    RA A Wave   9 mmHg    RA V Wave   12 mmHg    RA Mean   9 mmHg    RV Systolic Pressure   39 mmHg    RV Diastolic Pressure   6 mmHg    RV EDP   11 mmHg    PA Systolic Pressure   37 mmHg    PA Diastolic Pressure   17 mmHg    PA Mean   26 mmHg    PW A Wave   15 mmHg    PW V Wave   19 mmHg    PW Mean   13 mmHg    AO Systolic Pressure   118 mmHg    AO Diastolic Pressure   67 mmHg    AO Mean   90 mmHg    LV Systolic Pressure    127 mmHg    LV Diastolic Pressure   7 mmHg    LV EDP   15 mmHg    Arterial Occlusion Pressure Extended Systolic Pressure   118 mmHg    Arterial Occlusion Pressure Extended Diastolic Pressure   64 mmHg    Arterial Occlusion Pressure Extended Mean Pressure   85 mmHg    Left Ventricular Apex Extended Systolic Pressure   122 mmHg    Left Ventricular Apex Extended Diastolic Pressure   7 mmHg    Left Ventricular Apex Extended EDP Pressure   14 mmHg    QP/QS   1    TPVR Index   9.49 HRUI    TSVR Index   32.85 HRUI    PVR SVR Ratio   0.16    TPVR/TSVR Ratio   0.29                Impression:     Patient has stage D severe symptomatic primary mitral regurgitation. 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  She remains in rate-controlled persistent atrial fibrillation.  I have personally reviewed the patient's recent echocardiograms and diagnostic cardiac catheterization. Transthoracic and transesophageal echocardiograms revealed the presence of mitral valve prolapse with an eccentric jet of mitral regurgitation that is probably severe.  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 There is some posterior annulus calcification but overall both leaflets appear to move reasonably well. Left ventricular systolic function appears close to normal ear there is severe left atrial enlargement.  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 The tricuspid annulus is dilated. The tricuspid leaflets are not well visualized. Diagnostic cardiac catheterization is notable for the absence of significant coronary artery disease. I agree the patient would benefit from  mitral valve repair and Maze procedure. She may need tricuspid valve repair. It is possible that she might be candidate for minimally invasive approach. Risks of surgery will be somewhat elevated due to her advanced age, comorbid medical problems, and sedentary lifestyle.  She has not seen a dentist in several years.        Plan:     The patient was counseled at length regarding the indications, risks and potential benefits  of mitral valve repair.  The rationale for elective surgery has been explained, including a comparison between surgery and continued medical therapy with close follow-up.  The likelihood of successful and durable valve repair has been discussed with particular reference to the findings of their recent echocardiogram.  Based upon these findings and previous experience, I have quoted her a greater than 80 percent likelihood of successful valve repair.  The possible need for concomitant tricuspid valve repair was discussed.  The relative risks and benefits of performing a maze procedure at the time of her surgery was discussed at length, including the expected likelihood of long term freedom from recurrent symptomatic atrial fibrillation and/or atrial flutter.  All of her questions been addressed.     The patient is interested in proceeding with surgery in the near future. She will be referred to the dental clinic for evaluation. We will make arrangements for her to undergo formal pulmonary function testing and CT angiography to characterize the feasibility of peripheral arterial cannulation for surgery.  The patient will return in approximately 3-4 weeks to review the results of these tests and make final plans for surgery. All of her questions have been addressed.     I spent in excess of 90 minutes during the conduct of this office consultation and >50% of this time involved direct face-to-face encounter with the patient for counseling and/or coordination of their care.         Valentina Gu. Roxy Manns, MD  04/27/2016  12:05 PM             Electronically signed by Rexene Alberts, MD at 04/27/2016  6:36 PM Electronically signed by Rexene Alberts, MD at

## 2016-05-22 ENCOUNTER — Encounter (HOSPITAL_COMMUNITY): Payer: Self-pay | Admitting: Dentistry

## 2016-05-23 ENCOUNTER — Encounter (HOSPITAL_COMMUNITY): Payer: Self-pay | Admitting: Dentistry

## 2016-05-23 ENCOUNTER — Ambulatory Visit: Payer: Medicare Other | Admitting: Cardiology

## 2016-05-23 NOTE — Addendum Note (Signed)
Addendum  created 05/23/16 1308 by Josephine Igo, MD   SmartForm saved

## 2016-05-23 NOTE — Progress Notes (Deleted)
Clinical Summary [name] [name] is a 76 y.o.female   Past Medical History:  Diagnosis Date  . Asthma   . Atrial fibrillation, persistent ([place])   . Colon adenoma   . Colon cancer ([place])    status post low anterior resection, limited stage disease requiring no adjuvant therapy  . Diverticulosis   . DM (dermatomyositis)   . DVT (deep venous thrombosis) ([name])    in leg- long time ago  . Dyspnea    with activity  . Dysrhythmia   . Esophageal dysphagia   . GERD (gastroesophageal reflux disease)   . Heart murmur   . Hematuria   . Hemorrhoids   . Hiatal hernia   . History of kidney stones    x 2  . Hypercholesterolemia   . Mitral regurgitation   . PONV (postoperative nausea and vomiting) 2003 ish    with breast biopsy  . Schatzki's ring   . Tricuspid regurgitation      Allergies  Allergen Reactions  . Hydrocodone-Acetaminophen Hives  . Iohexol      Code: HIVES, Desc: pt. had a severe allergic reaction to IV contrast the last time she was injected and had to be seen in the ER.  Hives and sob.   . Nabumetone Rash  . Prednisone Rash     Current Outpatient Prescriptions  Medication Sig Dispense Refill  . acetaminophen (TYLENOL) 325 MG tablet Take 650 mg by mouth every 6 (six) hours as needed for pain.    Marland Kitchen albuterol (PROVENTIL HFA;VENTOLIN HFA) 108 (90 BASE) MCG/ACT inhaler Inhale 2 puffs into the lungs every 6 (six) hours as needed for wheezing.    Marland Kitchen ALPRAZolam (XANAX) 0.5 MG tablet Take 0.5 mg by mouth 2 (two) times daily as needed (for anxiety/sleep (scheduled at bedtime)).     . calcium carbonate (OS-CAL) 600 MG TABS tablet Take 600 mg by mouth 2 (two) times daily with a meal.    . Coenzyme Q10 100 MG TABS Take 100 mg by mouth every evening.     . diphenhydrAMINE (BENADRYL) 50 MG tablet Take 1 tablet (50 mg total) by mouth as directed. Take one tab at 8 pm 05/07/16, take one tab at 2 am 05/08/16, take one tab at 8 am 05/08/16. (Patient not taking: Reported on  05/10/2016) 3 tablet 0  . docusate sodium (COLACE) 100 MG capsule Take 200 mg by mouth daily as needed for mild constipation.     . metoprolol succinate (TOPROL-XL) 50 MG 24 hr tablet Take 1 tablet (50 mg total) by mouth daily. 90 tablet 3  . Multiple Vitamin (MULTIVITAMIN) tablet Take 1 tablet by mouth daily.      Marland Kitchen omeprazole (PRILOSEC) 20 MG capsule Take 1 capsule (20 mg total) by mouth daily. 30 capsule 3  . PARoxetine (PAXIL) 20 MG tablet Take 20 mg by mouth daily.     Vladimir Faster Glycol-Propyl Glycol (SYSTANE ULTRA) 0.4-0.3 % SOLN Place 1 drop into both eyes 3 (three) times daily.    . predniSONE (DELTASONE) 50 MG tablet Take 1 tablet (50 mg total) by mouth as directed. Take one tab at 8 pm 05/07/16, one tab at 2 am 05/08/16, one tab at 8 am 05/08/16 (Patient not taking: Reported on 05/10/2016) 3 tablet 0  . ranitidine (ZANTAC) 150 MG tablet Take one tab (150mg ) at 6:00 pm the evening prior to heart cath, one tab at midnight, and take the remaining tab with you to the hospital the morning of. (Patient  not taking: Reported on 05/10/2016) 3 tablet 0  . simvastatin (ZOCOR) 40 MG tablet Take 40 mg by mouth at bedtime.     . sodium chloride (OCEAN) 0.65 % SOLN nasal spray Place 1 spray into both nostrils as needed for congestion.    . traMADol (ULTRAM) 50 MG tablet Take 50 mg by mouth every 12 (twelve) hours as needed for moderate pain.      No current facility-administered medications for this visit.      Past Surgical History:  Procedure Laterality Date  . APPENDECTOMY    . BREAST SURGERY Right 2003ish   biopsy  . CARDIAC CATHETERIZATION N/A 04/12/2016   Procedure: Right/Left Heart Cath and Coronary Angiography;  Surgeon: Leonie Man, MD;  Location: [facility];  Service: Cardiovascular;  Laterality: N/A;  . CATARACT EXTRACTION Bilateral 2017  . COLONOSCOPY  11/09   Dr. Gala Romney- external hemorrhoidal tags o/w normal rectal mucosa, s/p surgical resection with a normal appearing  anastomosis 12cm, pan colonic diverticulum  . COLONOSCOPY N/A 06/02/2012   [id number] post low anterior resection. Pancolonic diverticulosis. Colonic polyp-tubular adenoma. Surveillance due 2019.   Marland Kitchen COLONOSCOPY N/A 10/13/2015   Procedure: COLONOSCOPY;  Surgeon: Daneil Dolin, MD;  [name];  Service: Endoscopy;  Laterality: N/A;  0930  . ESOPHAGOGASTRODUODENOSCOPY  07/2007   Dr. Daiva Nakayama cervical esophageal web, schatzki ring, large hiatal hernia  . INGUNAL HERNIA REPAIR    . LOW ANTERIOR BOWEL RESECTION     NO ADJ CHEMO  . MULTIPLE EXTRACTIONS WITH ALVEOLOPLASTY N/A 05/21/2016   Procedure: MULTIPLE EXTRACTION OF TOOTH #'S 5, 21 WITH ALVEOLOPLASTY AND GROSS DEBRIDEMENT OF TEETH;  Surgeon: Lenn Cal, DDS;  Location: [facility];  Service: Oral Surgery;  Laterality: N/A;  . TEE WITHOUT CARDIOVERSION N/A 02/20/2016   Procedure: TRANSESOPHAGEAL ECHOCARDIOGRAM (TEE);  Surgeon: Arnoldo Lenis, MD;  Location: [name];  Service: Endoscopy;  Laterality: N/A;  . TUBAL LIGATION    . VEIN LIGATION AND STRIPPING       Allergies  Allergen Reactions  . Hydrocodone-Acetaminophen Hives  . Iohexol      Code: HIVES, Desc: pt. had a severe allergic reaction to IV contrast the last time she was injected and had to be seen in the ER.  Hives and sob.   . Nabumetone Rash  . Prednisone Rash      Family History  Problem Relation Age of Onset  . Breast cancer Mother   . Rheum arthritis Father   . Cancer - Lung Sister   . Brain cancer Sister   . Prostate cancer Brother   . Aneurysm Brother   . Colon cancer Neg Hx      Social History [name] [name] reports that she has never smoked. She has never used smokeless tobacco. [name] [name] reports that she does not drink alcohol.   Review of Systems CONSTITUTIONAL: No weight loss, fever, chills, weakness or fatigue.  HEENT: Eyes: No visual loss, blurred vision, double vision or yellow sclerae.No hearing loss, sneezing,  congestion, runny nose or sore throat.  SKIN: No rash or itching.  CARDIOVASCULAR:  RESPIRATORY: No shortness of breath, cough or sputum.  GASTROINTESTINAL: No anorexia, nausea, vomiting or diarrhea. No abdominal pain or blood.  GENITOURINARY: No burning on urination, no polyuria NEUROLOGICAL: No headache, dizziness, syncope, paralysis, ataxia, numbness or tingling in the extremities. No change in bowel or bladder control.  MUSCULOSKELETAL: No muscle, back pain, joint pain or stiffness.  LYMPHATICS: No enlarged nodes.  No history of splenectomy.  PSYCHIATRIC: No history of depression or anxiety.  ENDOCRINOLOGIC: No reports of sweating, cold or heat intolerance. No polyuria or polydipsia.  Marland Kitchen   Physical Examination There were no vitals filed for this visit. There were no vitals filed for this visit.  Gen: resting comfortably, no acute distress HEENT: no scleral icterus, pupils equal round and reactive, no palptable cervical adenopathy,  CV Resp: Clear to auscultation bilaterally GI: abdomen is soft, non-tender, non-distended, normal bowel sounds, no hepatosplenomegaly MSK: extremities are warm, no edema.  Skin: warm, no rash Neuro:  no focal deficits Psych: appropriate affect   Diagnostic Studies     Assessment and Plan        Arnoldo Lenis, M.D., F.A.C.C.

## 2016-05-28 ENCOUNTER — Ambulatory Visit (INDEPENDENT_AMBULATORY_CARE_PROVIDER_SITE_OTHER): Payer: Medicare Other | Admitting: Thoracic Surgery (Cardiothoracic Vascular Surgery)

## 2016-05-28 ENCOUNTER — Other Ambulatory Visit: Payer: Self-pay | Admitting: *Deleted

## 2016-05-28 ENCOUNTER — Encounter: Payer: Self-pay | Admitting: Thoracic Surgery (Cardiothoracic Vascular Surgery)

## 2016-05-28 ENCOUNTER — Ambulatory Visit (HOSPITAL_COMMUNITY): Payer: Self-pay | Admitting: Dentistry

## 2016-05-28 ENCOUNTER — Encounter (HOSPITAL_COMMUNITY): Payer: Self-pay | Admitting: Dentistry

## 2016-05-28 VITALS — BP 118/73 | HR 96 | Temp 97.8°F

## 2016-05-28 VITALS — BP 123/86 | HR 95 | Resp 20 | Ht 62.5 in | Wt 158.0 lb

## 2016-05-28 DIAGNOSIS — I361 Nonrheumatic tricuspid (valve) insufficiency: Secondary | ICD-10-CM | POA: Diagnosis not present

## 2016-05-28 DIAGNOSIS — R911 Solitary pulmonary nodule: Secondary | ICD-10-CM

## 2016-05-28 DIAGNOSIS — Z01818 Encounter for other preprocedural examination: Secondary | ICD-10-CM

## 2016-05-28 DIAGNOSIS — I481 Persistent atrial fibrillation: Secondary | ICD-10-CM

## 2016-05-28 DIAGNOSIS — I340 Nonrheumatic mitral (valve) insufficiency: Secondary | ICD-10-CM

## 2016-05-28 DIAGNOSIS — I4819 Other persistent atrial fibrillation: Secondary | ICD-10-CM

## 2016-05-28 DIAGNOSIS — I4891 Unspecified atrial fibrillation: Secondary | ICD-10-CM

## 2016-05-28 DIAGNOSIS — K08199 Complete loss of teeth due to other specified cause, unspecified class: Secondary | ICD-10-CM

## 2016-05-28 DIAGNOSIS — I071 Rheumatic tricuspid insufficiency: Secondary | ICD-10-CM

## 2016-05-28 NOTE — Patient Instructions (Signed)
PLAN: 1. Continue salt water rinses as needed to aid healing. 2. Maintain soft diet at this time but advance as tolerated. 3. Follow-up with dentist of her choice for additional dental care as indicated once she is medically stable from the anticipated heart valve surgery. The patient will need antibiotic premedication prior to invasive dental procedures after the heart valve surgery per American Heart Association guidelines.  4. Patient is currently cleared for heart valve surgery.   Lenn Cal, DDS

## 2016-05-28 NOTE — Progress Notes (Signed)
[address]       ,[place] [address]             [phone]     CARDIOTHORACIC SURGERY OFFICE NOTE  Referring Provider is Branch, Alphonse Guild, MD PCP is Glenda Chroman, MD   HPI:  Patient returns to the office today for follow-up of severe symptomatic primary mitral regurgitation, long-standing persistent atrial fibrillation, tricuspid regurgitation, and chronic diastolic congestive heart failure.  She was originally seen in consultation on 04/27/2016. Since then she has been evaluated by Dr. Lawana Chambers and underwent an dental extraction last week.  She had pulmonary function tests performed and CT angiography. She returns for office today for follow-up to review the results of these tests and discuss treatment options further. She states that she got through her mouth surgery without any significant problems or complications. She still gets short of breath with moderate level activity and this limits her physical activities considerably. She states that her breathing is no worse than it has been. The remainder of her review of systems is unchanged from previously.   Current Outpatient Prescriptions  Medication Sig Dispense Refill  . acetaminophen (TYLENOL) 325 MG tablet Take 650 mg by mouth every 6 (six) hours as needed for pain.    Marland Kitchen albuterol (PROVENTIL HFA;VENTOLIN HFA) 108 (90 BASE) MCG/ACT inhaler Inhale 2 puffs into the lungs every 6 (six) hours as needed for wheezing.    Marland Kitchen ALPRAZolam (XANAX) 0.5 MG tablet Take 0.5 mg by mouth 2 (two) times daily as needed (for anxiety/sleep (scheduled at bedtime)).     . calcium carbonate (OS-CAL) 600 MG TABS tablet Take 600 mg by mouth 2 (two) times daily with a meal.    . Coenzyme Q10 100 MG TABS Take 100 mg by mouth every evening.     . docusate sodium (COLACE) 100 MG capsule Take 200 mg by mouth daily as needed for mild constipation.     . metoprolol succinate (TOPROL-XL) 50 MG 24 hr tablet Take 1 tablet (50 mg total) by  mouth daily. 90 tablet 3  . Multiple Vitamin (MULTIVITAMIN) tablet Take 1 tablet by mouth daily.      Marland Kitchen omeprazole (PRILOSEC) 20 MG capsule Take 1 capsule (20 mg total) by mouth daily. 30 capsule 3  . PARoxetine (PAXIL) 20 MG tablet Take 20 mg by mouth daily.     Vladimir Faster Glycol-Propyl Glycol (SYSTANE ULTRA) 0.4-0.3 % SOLN Place 1 drop into both eyes 3 (three) times daily.    . simvastatin (ZOCOR) 40 MG tablet Take 40 mg by mouth at bedtime.     . sodium chloride (OCEAN) 0.65 % SOLN nasal spray Place 1 spray into both nostrils as needed for congestion.    . traMADol (ULTRAM) 50 MG tablet Take 50 mg by mouth every 12 (twelve) hours as needed for moderate pain.      No current facility-administered medications for this visit.       Physical Exam:   BP 123/86   Pulse 95   Resp 20   Ht 5' 2.5" (1.588 m)   Wt 158 lb (71.7 kg)   SpO2 97% Comment: RA  BMI 28.44 kg/m   General:  Mildly obese but well appearing  Chest:   Clear to auscultation  CV:   Irregular rate and rhythm with systolic murmur  Incisions:  n/a  Abdomen:  Soft nontender  Extremities:  Warm and well-perfused  Diagnostic Tests:  CT ANGIOGRAPHY CHEST, ABDOMEN  AND PELVIS  TECHNIQUE: Multidetector CT imaging through the chest, abdomen and pelvis was performed using the standard protocol during bolus administration of intravenous contrast. Multiplanar reconstructed images and MIPs were obtained and reviewed to evaluate the vascular anatomy.  CONTRAST:  100 cc Isovue 370  COMPARISON:  None.  FINDINGS: CTA CHEST FINDINGS  Cardiovascular: No evidence of intramural hematoma, aortic dissection, or aortic aneurysm. Great vessels grossly patent. Left atrium is moderately enlarged.  Mediastinum/Nodes: No abnormal adenopathy. Stable large hiatal hernia.  Lungs/Pleura:  No pneumothorax.  No pleural effusion.  5 mm right lower lobe nodule on image 47 is stable supporting benign etiology.  There is an 8  mm spiculated density in the right middle lobe on image 28 of series 13. There are both solid and sub solid components.  Subsegmental atelectasis towards the lung bases.  Musculoskeletal: No chest wall abnormality. No acute or significant osseous findings.  Review of the MIP images confirms the above findings.  CTA ABDOMEN AND PELVIS FINDINGS  VASCULAR  Aorta: Non aneurysmal and patent.  No evidence of dissection.  Celiac: Patent.  SMA: Patent.  Renals: A single right renal artery and 2 left renal arteries are patent.  IMA: Patent and diminutive.  Inflow: Common, internal, and external iliac arteries are patent.  Veins: Non-opacified.  Review of the MIP images confirms the above findings.  NON-VASCULAR  Hepatobiliary: Calcified granuloma in the posterior segment of the right lobe. Unremarkable gallbladder.  Pancreas: Unremarkable. No pancreatic ductal dilatation or surrounding inflammatory changes.  Spleen: Normal in size without focal abnormality.  Adrenals/Urinary Tract: A few hypodensities in the kidneys are not significantly changed. Adrenal glands are unremarkable.  Stomach/Bowel: Large hiatal hernia. No obvious focal mass in the colon. No evidence of small-bowel obstruction. Moderate stool burden throughout the colon. Postoperative changes at the distal sigmoid colon are noted. There is no evidence of malignancy at the suture site.  Lymphatic: No abnormal retroperitoneal adenopathy. A few small para-aortic lymph nodes are present.  Reproductive: Uterus and adnexa are unremarkable.  Other: There is no free fluid. Ventral hernia repair with mesh is noted. Small right inguinal hernia contains adipose tissue.  Musculoskeletal: Scoliosis. No vertebral compression deformity in the lumbar spine.  Review of the MIP images confirms the above findings.  IMPRESSION: There is no evidence of aortic dissection, aneurysm, or  intramural hematoma. No significant arterial occlusive disease. No acute vascular pathology in the abdomen or pelvis.  There is a new 8 mm indeterminate right middle lobe pulmonary opacity. Three-month follow-up is recommended to ensure resolution. Initial follow-up by chest CT without contrast is recommended in 3 months to confirm persistence. This recommendation follows the consensus statement: Recommendations for the Management of Subsolid Pulmonary Nodules Detected at CT: A Statement from the [employer] as published in Radiology 2013; 266:304-317.  Large hiatal hernia is not significantly changed.  Postoperative changes.   Electronically Signed   By: Marybelle Killings M.D.   On: 05/08/2016 11:20   Pulmonary Function Tests  Baseline      Post-bronchodilator  FVC  1.79 L  (70% predicted) FVC  2.16 L  (85% predicted) FEV1  1.19 L  (63% predicted) FEV1  1.47 L  (77% predicted) FEF25-75 0.88 L  (68% predicted) FEF25-75 1.37 L  (92% predicted)  TLC  4.84 L  (101% predicted) RV  2.69 L  (121% predicted) DLCO  65% predicted    Impression:  Patient has stage D severe symptomatic primary mitral regurgitation. She describes a long  history of progressive symptoms of exertional shortness of breath and fatigue that have gotten considerably worse over the past several months, now consistent with chronic diastolic congestive heart failure New York Heart Association functional class III.  She remains in rate-controlled persistent atrial fibrillation.  I have personally reviewed the patient's recent echocardiograms, diagnostic cardiac catheterization, CT angiogram and PFT's. Transthoracic and transesophageal echocardiograms revealed the presence of mitral valve prolapse with an eccentric jet of mitral regurgitation that is probably severe.  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 There is some posterior annulus  calcification but overall both leaflets appear to move reasonably well. Left ventricular systolic function appears close to normal ear there is severe left atrial enlargement.  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 The tricuspid annulus is dilated. The tricuspid leaflets are not well visualized. Diagnostic cardiac catheterization is notable for the absence of significant coronary artery disease.  CT angiogram reveals no significant contraindications to an attempt at minimally invasive approach using peripheral arterial cannulation. Question was raised regarding a small (8 mm) vague opacity in the right middle lobe. I am personally not at all impressed that there is anything of significance but it may be reasonable to obtain a repeat limited CT scan prior to surgery. 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   I agree the patient would benefit from mitral valve repair and Maze procedure. She may need tricuspid valve repair. It is possible that she might be candidate for minimally invasive approach. Risks of surgery will be somewhat elevated due to her advanced age, comorbid medical problems, and sedentary lifestyle.     Plan:  I have again reviewed the indications, risks, and potential benefits of mitral valve repair, tricuspid valve repair, and Maze procedure in the office today. Expectations for the patient's postoperative convalescence at been discussed. Because the patient lives alone she will likely need short-term placement in a skilled nursing facility during her convalescence. She has asked about this and would like to meet with case managers to look into making arrangements prior to her hospitalization.  All of her questions have been answered. We tentatively plan to proceed with surgery on 07/05/2016. The patient will return to our office for follow-up prior to  surgery on 06/25/2016.    I spent in excess of 30 minutes during the conduct of this office consultation and >50% of this time involved direct face-to-face encounter with the patient for counseling and/or coordination of their care.    Valentina Gu. Roxy Manns, MD 05/28/2016 11:52 AM

## 2016-05-28 NOTE — Progress Notes (Signed)
POST OPERATIVE NOTE:  05/28/2016 [name] [name] [id number]  VITALS: BP 118/73 (BP Location: Left Arm)   Pulse 96   Temp 97.8 F (36.6 C) (Oral)   LABS:  Lab Results  Component Value Date   WBC 5.9 05/21/2016   HGB 11.1 (L) 05/21/2016   HCT 35.1 (L) 05/21/2016   MCV 94.9 05/21/2016   PLT 220 05/21/2016   BMET    Component Value Date/Time   NA 143 05/21/2016 0730   K 4.0 05/21/2016 0730   CL 106 05/21/2016 0730   CO2 29 05/21/2016 0730   GLUCOSE 97 05/21/2016 0730   BUN 11 05/21/2016 0730   CREATININE 0.73 05/21/2016 0730   CALCIUM 8.9 05/21/2016 0730   GFRNONAA >60 05/21/2016 0730   GFRAA >60 05/21/2016 0730    Lab Results  Component Value Date   INR 1.06 05/21/2016   INR 1.05 04/12/2016   No results found for: PTT   [name] [name] is status post multiple extractions with alveoloplasty and gross debridement of remaining teeth on 05/21/2016. Patient now presents for evaluation of healing and suture removal as needed.   SUBJECTIVE: Patient without complaints. Extraction sites are tender by report. Several stitches still remain.  EXAM: There is no sign of infection, heme, or ooze. Sutures are loosely intact.  PROCEDURE: The patient was given a chlorhexidine gluconate rinse for 30 seconds. Sutures were then removed without complication. Patient tolerated the procedure well.  ASSESSMENT: Post operative course is consistent with dental procedures performed in the operating room with general anesthesia   PLAN: 1. Continue salt water rinses as needed to aid healing. 2. Maintain soft diet at this time but advance as tolerated. 3. Follow-up with dentist of her choice for additional dental care as indicated once she is medically stable from the anticipated heart valve surgery. The patient will need antibiotic premedication prior to invasive dental procedures after the heart valve surgery per American Heart Association guidelines.  4. Patient is currently  cleared for heart valve surgery.   Lenn Cal, DDS

## 2016-05-28 NOTE — Patient Instructions (Signed)
Continue all previous medications without any changes at this time  

## 2016-05-31 DIAGNOSIS — I4891 Unspecified atrial fibrillation: Secondary | ICD-10-CM | POA: Diagnosis not present

## 2016-05-31 DIAGNOSIS — J449 Chronic obstructive pulmonary disease, unspecified: Secondary | ICD-10-CM | POA: Diagnosis not present

## 2016-05-31 DIAGNOSIS — M159 Polyosteoarthritis, unspecified: Secondary | ICD-10-CM | POA: Diagnosis not present

## 2016-05-31 DIAGNOSIS — E7800 Pure hypercholesterolemia, unspecified: Secondary | ICD-10-CM | POA: Diagnosis not present

## 2016-06-18 ENCOUNTER — Encounter: Payer: Medicare Other | Admitting: Thoracic Surgery (Cardiothoracic Vascular Surgery)

## 2016-06-20 ENCOUNTER — Ambulatory Visit (INDEPENDENT_AMBULATORY_CARE_PROVIDER_SITE_OTHER): Payer: Medicare Other | Admitting: Nurse Practitioner

## 2016-06-20 ENCOUNTER — Encounter: Payer: Self-pay | Admitting: Nurse Practitioner

## 2016-06-20 VITALS — BP 112/75 | HR 94 | Temp 97.8°F | Ht 62.0 in | Wt 157.4 lb

## 2016-06-20 DIAGNOSIS — K625 Hemorrhage of anus and rectum: Secondary | ICD-10-CM | POA: Diagnosis not present

## 2016-06-20 DIAGNOSIS — K5900 Constipation, unspecified: Secondary | ICD-10-CM | POA: Diagnosis not present

## 2016-06-20 DIAGNOSIS — R1030 Lower abdominal pain, unspecified: Secondary | ICD-10-CM | POA: Diagnosis not present

## 2016-06-20 MED ORDER — HYDROCORTISONE 2.5 % RE CREA: 1.0000 "application " | TOPICAL_CREAM | Freq: Two times a day (BID) | RECTAL | 1 refills | Status: DC

## 2016-06-20 NOTE — Assessment & Plan Note (Signed)
Persisting constipation, takes Colace daily when she remembers. Has breakthrough constipation about once a month. I will have her take MiraLAX as needed on breakthrough constipation days. Return for follow-up in 6 months.

## 2016-06-20 NOTE — Assessment & Plan Note (Signed)
Mid to lower abdominal pain associated with her bouts of constipation. Abdominal pain improved to good bowel movement. Further constipation management as per above. Return for follow-up in 6 months.

## 2016-06-20 NOTE — Progress Notes (Signed)
cc'ed to pcp °

## 2016-06-20 NOTE — Patient Instructions (Signed)
1. Continue taking Colace. 2. Take MiraLAX one dose (17 g or 1 capful) as needed on days that she have worsening constipation. 3. I sent in a prescription for Anusol rectal cream that you can apply twice a day for up to 10 days at a time for hemorrhoid flareups symptoms. 4. Return for follow-up in 6 months. 5. Best of luck with your upcoming heart surgery!!!

## 2016-06-20 NOTE — Progress Notes (Signed)
Referring Provider: Glenda Chroman, MD Primary Care Physician:  Glenda Chroman, MD Primary GI:  Dr. Gala Romney  Chief Complaint  Patient presents with  . Constipation  . Rectal Bleeding    has been bright red  . Abdominal Pain    mid abd    HPI:   [name] [name] is a 76 y.o. female who presents for follow-up on rectal bleeding, constipation, lower abdominal pain. The patient was last seen in our office 03/22/2016 for the same. States she's doing well at that time, doesn't always remember to take daily Colace and when she forgets she has intermittent constipation. Intermittent abdominal pain and hemorrhoid bleeding on Xarelto with constipation, she is anticoagulated for A. fib. No other GI symptoms.   Last colonoscopy 10/13/2015 which found moderate diverticulosis, single 5 mm polyp in the descending colon, otherwise normal. Polyp found to be serrated polyp/adenoma without dysplasia and recommended 1 more colonoscopy in 3 years if health permits.  Per chart review the patient is scheduled for minimally invasive mitral valve repair on 07/05/2016.  Today she states she's doing pretty good. Still with some occasional hemorrhoid bleeding. Possibly somewhat constipated at the time. Uses preparation H wipes which helps a lot. Still anticoagulated for AFib. Still with about a once a month bout of constipation. Is taking Colace every day "when I think about it." Occasionally has rectal pain after a constipated bowel movement. Occasional abdominal pain mid to lower abdomen, pain improves after a good bowel movement. Denies other abdominal pain, N/V, melena, sudden changes in bowel habits or consistency. Denies chest pain, dyspnea, dizziness, lightheadedness, syncope, near syncope. Denies any other upper or lower GI symptoms.  Past Medical History:  Diagnosis Date  . Asthma   . Atrial fibrillation, persistent ([facility])   . Colon adenoma   . Colon cancer ([facility])    status post low anterior resection,  limited stage disease requiring no adjuvant therapy  . Diverticulosis   . DM (dermatomyositis)   . DVT (deep venous thrombosis) ([name])    in leg- long time ago  . Dyspnea    with activity  . Dysrhythmia   . Esophageal dysphagia   . GERD (gastroesophageal reflux disease)   . Heart murmur   . Hematuria   . Hemorrhoids   . Hiatal hernia   . History of kidney stones    x 2  . Hypercholesterolemia   . Incidental pulmonary nodule 05/08/2016   8 mm vague opacity RML noted on CT scan  . Mitral regurgitation   . PONV (postoperative nausea and vomiting) 2003 ish    with breast biopsy  . Schatzki's ring   . Tricuspid regurgitation     Past Surgical History:  Procedure Laterality Date  . APPENDECTOMY    . BREAST SURGERY Right 2003ish   biopsy  . CARDIAC CATHETERIZATION N/A 04/12/2016   Procedure: Right/Left Heart Cath and Coronary Angiography;  Surgeon: Leonie Man, MD;  Location: [facility];  Service: Cardiovascular;  Laterality: N/A;  . CATARACT EXTRACTION Bilateral 2017  . COLONOSCOPY  11/09   Dr. Gala Romney- external hemorrhoidal tags o/w normal rectal mucosa, s/p surgical resection with a normal appearing anastomosis 12cm, pan colonic diverticulum  . COLONOSCOPY N/A 06/02/2012   [id number] post low anterior resection. Pancolonic diverticulosis. Colonic polyp-tubular adenoma. Surveillance due 2019.   Marland Kitchen COLONOSCOPY N/A 10/13/2015   Procedure: COLONOSCOPY;  Surgeon: Daneil Dolin, MD;  [name];  Service: Endoscopy;  Laterality: N/A;  0930  . ESOPHAGOGASTRODUODENOSCOPY  07/2007   Dr. Daiva Nakayama cervical esophageal web, schatzki ring, large hiatal hernia  . INGUNAL HERNIA REPAIR    . LOW ANTERIOR BOWEL RESECTION     NO ADJ CHEMO  . MULTIPLE EXTRACTIONS WITH ALVEOLOPLASTY N/A 05/21/2016   Procedure: MULTIPLE EXTRACTION OF TOOTH #'S 5, 21 WITH ALVEOLOPLASTY AND GROSS DEBRIDEMENT OF TEETH;  Surgeon: Lenn Cal, DDS;  Location: [facility];  Service: Oral Surgery;   Laterality: N/A;  . TEE WITHOUT CARDIOVERSION N/A 02/20/2016   Procedure: TRANSESOPHAGEAL ECHOCARDIOGRAM (TEE);  Surgeon: Arnoldo Lenis, MD;  Location: [name];  Service: Endoscopy;  Laterality: N/A;  . TOOTH EXTRACTION    . TUBAL LIGATION    . VEIN LIGATION AND STRIPPING      Current Outpatient Prescriptions  Medication Sig Dispense Refill  . acetaminophen (TYLENOL) 325 MG tablet Take 650 mg by mouth every 6 (six) hours as needed for pain.    Marland Kitchen albuterol (PROVENTIL HFA;VENTOLIN HFA) 108 (90 BASE) MCG/ACT inhaler Inhale 2 puffs into the lungs every 6 (six) hours as needed for wheezing.    Marland Kitchen ALPRAZolam (XANAX) 0.5 MG tablet Take 0.5 mg by mouth 2 (two) times daily as needed (for anxiety/sleep (scheduled at bedtime)).     . calcium carbonate (OS-CAL) 600 MG TABS tablet Take 600 mg by mouth 2 (two) times daily with a meal.    . Coenzyme Q10 100 MG TABS Take 100 mg by mouth every evening.     . docusate sodium (COLACE) 100 MG capsule Take 200 mg by mouth daily as needed for mild constipation.     . metoprolol succinate (TOPROL-XL) 50 MG 24 hr tablet Take 1 tablet (50 mg total) by mouth daily. 90 tablet 3  . Multiple Vitamin (MULTIVITAMIN) tablet Take 1 tablet by mouth daily.      Marland Kitchen omeprazole (PRILOSEC) 20 MG capsule Take 1 capsule (20 mg total) by mouth daily. 30 capsule 3  . PARoxetine (PAXIL) 20 MG tablet Take 20 mg by mouth daily.     Vladimir Faster Glycol-Propyl Glycol (SYSTANE ULTRA) 0.4-0.3 % SOLN Place 1 drop into both eyes 3 (three) times daily.    . simvastatin (ZOCOR) 40 MG tablet Take 40 mg by mouth at bedtime.     . sodium chloride (OCEAN) 0.65 % SOLN nasal spray Place 1 spray into both nostrils as needed for congestion.    . traMADol (ULTRAM) 50 MG tablet Take 50 mg by mouth every 12 (twelve) hours as needed for moderate pain.      No current facility-administered medications for this visit.     Allergies as of 06/20/2016 - Review Complete 06/20/2016  Allergen Reaction  Noted  . Hydrocodone-acetaminophen Hives 04/26/2010  . Iohexol  07/16/2008  . Nabumetone Rash 04/26/2010  . Prednisone Rash 10/13/2015    Family History  Problem Relation Age of Onset  . Breast cancer Mother   . Rheum arthritis Father   . Cancer - Lung Sister   . Brain cancer Sister   . Prostate cancer Brother   . Aneurysm Brother   . Colon cancer Neg Hx     Social History   Social History  . Marital status: Married    Spouse name: N/A  . Number of children: 2  . Years of education: N/A   Social History Main Topics  . Smoking status: Never Smoker  . Smokeless tobacco: Never Used  . Alcohol use No     Comment: Rarely  .  Drug use: No  . Sexual activity: Not Asked   Other Topics Concern  . None   Social History Narrative  . None    Review of Systems: General: Negative for anorexia, weight loss, fever, chills, fatigue, weakness. ENT: Negative for hoarseness, difficulty swallowing. CV: Negative for chest pain, angina, palpitations, peripheral edema.  Respiratory: Negative for dyspnea at rest, cough, sputum, wheezing.  GI: See history of present illness. Endo: Negative for unusual weight change.  Heme: Negative for bruising or bleeding.   Physical Exam: BP 112/75   Pulse 94   Temp 97.8 F (36.6 C) (Oral)   Ht 5\' 2"  (1.575 m)   Wt 157 lb 6.4 oz (71.4 kg)   BMI 28.79 kg/m  General:   Alert and oriented. Pleasant and cooperative. Well-nourished and well-developed.  Eyes:  Without icterus, sclera clear and conjunctiva pink.  Ears:  Normal auditory acuity. Cardiovascular:  S1, S2 present without murmurs appreciated. Extremities without clubbing or edema. Respiratory:  Clear to auscultation bilaterally. No wheezes, rales, or rhonchi. No distress.  Gastrointestinal:  +BS, soft, non-tender and non-distended. No HSM noted. No guarding or rebound. No masses appreciated.  Rectal:  Deferred  Musculoskalatal:  Symmetrical without gross deformities. Neurologic:  Alert  and oriented x4;  grossly normal neurologically. Psych:  Alert and cooperative. Normal mood and affect. Heme/Lymph/Immune: No excessive bruising noted.    06/20/2016 9:51 AM   Disclaimer: This note was dictated with voice recognition software. Similar sounding words can inadvertently be transcribed and may not be corrected upon review.

## 2016-06-20 NOTE — Assessment & Plan Note (Signed)
Occasional toilet tissue hematochezia in the setting of anticoagulation for A. fib and known hemorrhoids. Colonoscopy completed 10/13/2015 which was reassuring. At this point she has been relying on over-the-counter Preparation H. I will send in a prescription for Anusol cream to use as needed for hemorrhoid symptoms or rectal bleeding. Return for follow-up in 6 months.

## 2016-06-25 ENCOUNTER — Other Ambulatory Visit: Payer: Self-pay | Admitting: *Deleted

## 2016-06-25 ENCOUNTER — Encounter: Payer: Self-pay | Admitting: Thoracic Surgery (Cardiothoracic Vascular Surgery)

## 2016-06-25 ENCOUNTER — Ambulatory Visit (INDEPENDENT_AMBULATORY_CARE_PROVIDER_SITE_OTHER): Payer: Medicare Other | Admitting: Thoracic Surgery (Cardiothoracic Vascular Surgery)

## 2016-06-25 VITALS — BP 134/80 | HR 100 | Resp 20 | Ht 62.0 in | Wt 157.0 lb

## 2016-06-25 DIAGNOSIS — I481 Persistent atrial fibrillation: Secondary | ICD-10-CM | POA: Diagnosis not present

## 2016-06-25 DIAGNOSIS — I340 Nonrheumatic mitral (valve) insufficiency: Secondary | ICD-10-CM | POA: Diagnosis not present

## 2016-06-25 DIAGNOSIS — I361 Nonrheumatic tricuspid (valve) insufficiency: Secondary | ICD-10-CM | POA: Diagnosis not present

## 2016-06-25 DIAGNOSIS — R911 Solitary pulmonary nodule: Secondary | ICD-10-CM

## 2016-06-25 DIAGNOSIS — I4819 Other persistent atrial fibrillation: Secondary | ICD-10-CM

## 2016-06-25 MED ORDER — AMIODARONE HCL 200 MG PO TABS: 200.0000 mg | ORAL_TABLET | Freq: Every day | ORAL | 0 refills | Status: DC

## 2016-06-25 NOTE — Progress Notes (Signed)
[address]       [place] [address]             [phone]     CARDIOTHORACIC SURGERY OFFICE NOTE  Referring Provider is Branch, Alphonse Guild, MD PCP is Glenda Chroman, MD   HPI:  Patient returns to the office today for follow-up of severe symptomatic primary mitral regurgitation, long-standing persistent atrial fibrillation, tricuspid regurgitation, and chronic diastolic congestive heart failure. She was originally seen in consultation on 04/27/2016. She was last seen here in our office on 05/28/2016. She returns to the office today with tentative plans to proceed with elective surgery next week. She reports no new problems or complaints over the last few weeks. For the first time she is accompanied to the office today by her husband.   Current Outpatient Prescriptions  Medication Sig Dispense Refill  . acetaminophen (TYLENOL) 325 MG tablet Take 650 mg by mouth every 6 (six) hours as needed for pain.    Marland Kitchen albuterol (PROVENTIL HFA;VENTOLIN HFA) 108 (90 BASE) MCG/ACT inhaler Inhale 2 puffs into the lungs every 6 (six) hours as needed for wheezing.    Marland Kitchen ALPRAZolam (XANAX) 0.5 MG tablet Take 0.5 mg by mouth 2 (two) times daily as needed (for anxiety/sleep (scheduled at bedtime)).     . calcium carbonate (OS-CAL) 600 MG TABS tablet Take 600 mg by mouth 2 (two) times daily with a meal.    . Coenzyme Q10 100 MG TABS Take 100 mg by mouth every evening.     . docusate sodium (COLACE) 100 MG capsule Take 200 mg by mouth daily as needed for mild constipation.     . hydrocortisone (ANUSOL-HC) 2.5 % rectal cream Place 1 application rectally 2 (two) times daily. FOR UP TO 10 DAYS AT A TIME 30 g 1  . metoprolol succinate (TOPROL-XL) 50 MG 24 hr tablet Take 1 tablet (50 mg total) by mouth daily. 90 tablet 3  . Multiple Vitamin (MULTIVITAMIN) tablet Take 1 tablet by mouth daily.      Marland Kitchen omeprazole (PRILOSEC) 20 MG capsule Take 1 capsule (20 mg total) by mouth daily. 30 capsule 3    . PARoxetine (PAXIL) 20 MG tablet Take 20 mg by mouth daily.     Vladimir Faster Glycol-Propyl Glycol (SYSTANE ULTRA) 0.4-0.3 % SOLN Place 1 drop into both eyes 3 (three) times daily.    . simvastatin (ZOCOR) 40 MG tablet Take 40 mg by mouth at bedtime.     . sodium chloride (OCEAN) 0.65 % SOLN nasal spray Place 1 spray into both nostrils as needed for congestion.    . traMADol (ULTRAM) 50 MG tablet Take 50 mg by mouth every 12 (twelve) hours as needed for moderate pain.      No current facility-administered medications for this visit.       Physical Exam:   BP 134/80   Pulse 100   Resp 20   Ht 5\' 2"  (1.575 m)   Wt 157 lb (71.2 kg)   SpO2 93% Comment: RA  BMI 28.72 kg/m   General:  Well-appearing  Chest:   Clear to auscultation  CV:   Irregular rate and rhythm with systolic murmur  Incisions:  n/a  Abdomen:  Soft and nontender  Extremities:  Warm and well-perfused  Diagnostic Tests:  n/a   Impression:  Patient has stage D severe symptomatic primary mitral regurgitation. She describes a long history of progressive symptoms of exertional shortness of breath and  fatigue that have gotten considerably worse over the past several months, now consistent with chronic diastolic congestive heart failure New [place] Heart Association functional class III. She remains in rate-controlled persistent atrial fibrillation. I have personally reviewed the patient's recent echocardiograms, diagnostic cardiac catheterization, CT angiogram and PFT's. Transthoracic and transesophageal echocardiograms revealed the presence of mitral valve prolapse with an eccentric jet of mitral regurgitation that is probably severe. 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 There is some posterior annulus calcification but overall both leaflets appear to move reasonably well. Left ventricular systolic function appears close to normal.  There is severe left  atrial enlargement and severe tricuspid regurgitation. The tricuspid annulus is dilated. The tricuspid leaflets are not well visualized. Diagnostic cardiac catheterization is notable for the absence of significant coronary artery disease.  CT angiogram reveals no significant contraindications to an attempt at minimally invasive approach using peripheral arterial cannulation. Question was raised regarding a small (8 mm) vague opacity in the right middle lobe. I am personally not at all impressed that there is anything of significance but it may be reasonable to obtain a repeat limited CT scan prior to surgery. 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   I agree the patient would benefit from mitral valve repair and Maze procedure. She may need tricuspid valve repair. It is possible that she might be candidate for minimally invasive approach. Risks of surgery will be somewhat elevated due to her advanced age, comorbid medical problems, and sedentary lifestyle.    Plan:  I have again reviewed the indications, risks, and potential benefits of mitral valve repair, tricuspid valve repair, and Maze procedure with the patient and her husband in the office today. Expectations for the patient's postoperative convalescence at been discussed.  The rationale for elective surgery has been explained, including a comparison between surgery and continued medical therapy with close follow-up.  The likelihood of successful and durable valve repair has been discussed with particular reference to the findings of their recent echocardiogram.  Based upon these findings and previous experience, I have quoted them a greater than 90 percent likelihood of successful valve repair.  In the unlikely event that their valve cannot be successfully repaired, we discussed the possibility of replacing the mitral  valve using a mechanical prosthesis with the attendant need for long-term anticoagulation versus the alternative of replacing it using a bioprosthetic tissue valve with its potential for late structural valve deterioration and failure, depending upon the patient's longevity.  The patient specifically requests that if the mitral valve must be replaced that it be done using a bioprosthetic valve.   The patient understands and accepts all potential risks of surgery including but not limited to risk of death, stroke or other neurologic complication, myocardial infarction, congestive heart failure, respiratory failure, renal failure, bleeding requiring transfusion and/or reexploration, arrhythmia, infection or other wound complications, pneumonia, pleural and/or pericardial effusion, pulmonary embolus, aortic dissection or other major vascular complication, or delayed complications related to valve repair or replacement including but not limited to structural valve deterioration and failure, thrombosis, embolization, endocarditis, or paravalvular leak.  Alternative surgical approaches have been discussed including a comparison between conventional sternotomy and minimally-invasive techniques.  The relative risks and benefits of each have been reviewed as they pertain to the patient's specific circumstances, and all of their questions have been  addressed.  Specific risks potentially related to the minimally-invasive approach were discussed at length, including but not limited to risk of conversion to full or partial sternotomy, aortic dissection or other major vascular complication, unilateral acute lung injury or pulmonary edema, phrenic nerve dysfunction or paralysis, rib fracture, chronic pain, lung hernia, or lymphocele. All of their questions have been answered.  The patient has been instructed to stop taking Xarelto on Thursday, 06/28/2016. In addition, she has been given a new prescription for amiodarone to begin  prior to surgery. She has been instructed to stop taking Zocor while she is taking amiodarone. On the morning of surgery she has been instructed to take only Toprol-XL and Prilosec with a sip of water.    I spent in excess of 30 minutes during the conduct of this office consultation and >50% of this time involved direct face-to-face encounter with the patient for counseling and/or coordination of their care.    Valentina Gu. Roxy Manns, MD 06/25/2016 4:18 PM

## 2016-06-25 NOTE — Patient Instructions (Addendum)
Stop taking Xarelto and Zocor on Thursday 06/28/2016 - 1 week prior to surgery  Begin taking Amiodarone on Thursday 06/28/2016 - 1 week prior to surgery  Continue taking all other medications without change through the day before surgery.  Have nothing to eat or drink after midnight the night before surgery.  On the morning of surgery take only Toprol XL and Prilosec with a sip of water.

## 2016-06-26 ENCOUNTER — Other Ambulatory Visit: Payer: Self-pay | Admitting: *Deleted

## 2016-06-26 DIAGNOSIS — R911 Solitary pulmonary nodule: Secondary | ICD-10-CM

## 2016-06-27 DIAGNOSIS — I4891 Unspecified atrial fibrillation: Secondary | ICD-10-CM | POA: Diagnosis not present

## 2016-06-27 DIAGNOSIS — M159 Polyosteoarthritis, unspecified: Secondary | ICD-10-CM | POA: Diagnosis not present

## 2016-06-27 DIAGNOSIS — J449 Chronic obstructive pulmonary disease, unspecified: Secondary | ICD-10-CM | POA: Diagnosis not present

## 2016-06-27 DIAGNOSIS — E7800 Pure hypercholesterolemia, unspecified: Secondary | ICD-10-CM | POA: Diagnosis not present

## 2016-07-02 NOTE — H&P (Signed)
[address]       [place] [address]             [phone]          CARDIOTHORACIC SURGERY HISTORY AND PHYSICAL EXAM  Referring Provider is Branch, Alphonse Guild, MD PCP is Glenda Chroman, MD      Chief Complaint  Patient presents with  . Mitral Regurgitation    SEVERE...ECHO 01/25/16, CATH 04/12/16    HPI:  Patient is a 76 year old female with mitral regurgitation, atrial fibrillation on chronic anticoagulation, chronic diastolic congestive heart failure, COPD, hyperlipidemia, remote history of colon cancer, and more recent history of mild intermittent rectal bleeding attributed to hemorrhoids who has been referred for surgical consultation to discuss treatment options for management of severe mitral regurgitation.  The patient states that she has a long history of symptoms of exertional shortness breath dating back many years. She is a nonsmoker who has been diagnosed with COPD, although she was exposed to secondhand smoke by her husband who smokes a lot. Approximately 3 years ago she was noted to have an irregular heart rhythm and diagnosed with atrial fibrillation. She underwent an echocardiogram and was found to have mitral regurgitation. She was started on Xarelto and followed for several years by a cardiologist with [facility].  Her cardiologist moved away and she was referred to Dr. [name] in September 2017 because of worsening symptoms of shortness of breath.  At that time she was noted to be in rate controlled persistent atrial fibrillation.  It is unclear how long it has been since she was in sinus rhythm, but she has never been treated with antiarrhythmic agents nor had DC cardioversion performed.  Follow-up echocardiogram performed 01/25/2016 revealed normal left ventricular systolic function with an eccentric jet of mitral regurgitation that was reported as moderate to severe with associated severe left atrial enlargement and moderate to severe tricuspid  regurgitation.  She subsequently underwent TEE on 02/20/2016 that revealed mitral valve prolapse with a very eccentric jet of regurgitation and moderate to severe mitral regurgitation with normal left ventricular systolic function, ejection fraction estimated 60-65%, severe left atrial enlargement, and severe tricuspid regurgitation.  Left and right heart catheterization was performed 04/12/2016 and revealed mild nonobstructive coronary artery disease with mild pulmonary hypertension and severe (4+) mitral regurgitation.  The patient was referred for elective surgical consultation.  Patient returns to the office today for follow-up of severe symptomatic primary mitral regurgitation, long-standing persistent atrial fibrillation, tricuspid regurgitation, and chronic diastolic congestive heart failure. She was originally seen in consultation on 04/27/2016. She was last seen here in our office on 05/28/2016. She returns to the office today with tentative plans to proceed with elective surgery next week. She reports no new problems or complaints over the last few weeks. For the first time she is accompanied to the office today by her husband.  The patient is married and lives with her husband in [place], [place].  She has been retired for nearly 20 years, having previously worked in the [employer]. She lives a sedentary lifestyle. She states that she does not exercise at all. She complains of progressive symptoms of exertional shortness breath and fatigue. She has had some degree of shortness of breath for many years, and over the past 3-6 months her symptoms have gotten much worse. She now gets short of breath with mild activity and this limits her daily activities considerably.  She denies any recent history of resting shortness  of breath. She occasionally gets short of breath at night but typically she can sleep on one pillow. She has had some mild dizzy spells and lower extremity edema. She has not had any  exertional chest pain but she does report some tightness across her chest when she is short of breath. She has intermittent dry cough and frequent wheezing. This is been attributed to history of asthma. She has mild arthritis but no problems with ambulation. She states that her physical activities are limited primarily by shortness of breath. She has not seen a dentist in at least 3 or 4 years. She denies any loose teeth or painful teeth at present. She does not have dentures.     Past Medical History:  Diagnosis Date  . Asthma   . Atrial fibrillation, persistent ([facility])   . Colon adenoma   . Colon cancer ([facility])    status post low anterior resection, limited stage disease requiring no adjuvant therapy  . Diverticulosis   . DM (dermatomyositis)   . DVT (deep venous thrombosis) ([name])    in leg- long time ago  . Dyspnea    with activity  . Dysrhythmia   . Esophageal dysphagia   . GERD (gastroesophageal reflux disease)   . Heart murmur   . Hematuria   . Hemorrhoids   . Hiatal hernia   . History of kidney stones    x 2  . Hypercholesterolemia   . Incidental pulmonary nodule 05/08/2016   8 mm vague opacity RML noted on CT scan  . Mitral regurgitation   . PONV (postoperative nausea and vomiting) 2003 ish    with breast biopsy  . Schatzki's ring   . Tricuspid regurgitation     Past Surgical History:  Procedure Laterality Date  . APPENDECTOMY    . BREAST SURGERY Right 2003ish   biopsy  . CARDIAC CATHETERIZATION N/A 04/12/2016   Procedure: Right/Left Heart Cath and Coronary Angiography;  Surgeon: Leonie Man, MD;  Location: [facility];  Service: Cardiovascular;  Laterality: N/A;  . CATARACT EXTRACTION Bilateral 2017  . COLONOSCOPY  11/09   Dr. Gala Romney- external hemorrhoidal tags o/w normal rectal mucosa, s/p surgical resection with a normal appearing anastomosis 12cm, pan colonic diverticulum  . COLONOSCOPY N/A 06/02/2012   KDT:OIZTIW post low anterior resection. Pancolonic  diverticulosis. Colonic polyp-tubular adenoma. Surveillance due 2019.   Marland Kitchen COLONOSCOPY N/A 10/13/2015   Procedure: COLONOSCOPY;  Surgeon: Daneil Dolin, MD;  [name];  Service: Endoscopy;  Laterality: N/A;  0930  . ESOPHAGOGASTRODUODENOSCOPY  07/2007   Dr. Daiva Nakayama cervical esophageal web, schatzki ring, large hiatal hernia  . INGUNAL HERNIA REPAIR    . LOW ANTERIOR BOWEL RESECTION     NO ADJ CHEMO  . MULTIPLE EXTRACTIONS WITH ALVEOLOPLASTY N/A 05/21/2016   Procedure: MULTIPLE EXTRACTION OF TOOTH #'S 5, 21 WITH ALVEOLOPLASTY AND GROSS DEBRIDEMENT OF TEETH;  Surgeon: Lenn Cal, DDS;  Location: [facility];  Service: Oral Surgery;  Laterality: N/A;  . TEE WITHOUT CARDIOVERSION N/A 02/20/2016   Procedure: TRANSESOPHAGEAL ECHOCARDIOGRAM (TEE);  Surgeon: Arnoldo Lenis, MD;  [name];  Service: Endoscopy;  Laterality: N/A;  . TOOTH EXTRACTION    . TUBAL LIGATION    . VEIN LIGATION AND STRIPPING      Family History  Problem Relation Age of Onset  . Breast cancer Mother   . Rheum arthritis Father   . Cancer - Lung Sister   . Brain cancer Sister   .  Prostate cancer Brother   . Aneurysm Brother   . Colon cancer Neg Hx     Social History Social History  Substance Use Topics  . Smoking status: Never Smoker  . Smokeless tobacco: Never Used  . Alcohol use No     Comment: Rarely    Prior to Admission medications   Medication Sig Start Date End Date Taking? Authorizing Provider  acetaminophen (TYLENOL) 325 MG tablet Take 650 mg by mouth every 6 (six) hours as needed for pain.   Yes Historical Provider, MD  albuterol (PROVENTIL HFA;VENTOLIN HFA) 108 (90 BASE) MCG/ACT inhaler Inhale 2 puffs into the lungs every 6 (six) hours as needed for wheezing.   Yes Historical Provider, MD  ALPRAZolam [name]) 0.5 MG tablet Take 0.5 mg by mouth 2 (two) times daily as needed (for anxiety/sleep (scheduled at bedtime)).    Yes Historical Provider, MD  amiodarone  (PACERONE) 200 MG tablet Take 1 tablet (200 mg total) by mouth daily. 06/28/16  Yes Rexene Alberts, MD  calcium carbonate (OS-CAL) 600 MG TABS tablet Take 600 mg by mouth 2 (two) times daily with a meal.   Yes Historical Provider, MD  Coenzyme Q10 100 MG TABS Take 100 mg by mouth every evening.    Yes Historical Provider, MD  docusate sodium (COLACE) 100 MG capsule Take 200 mg by mouth daily as needed for mild constipation.    Yes Historical Provider, MD  hydrocortisone (ANUSOL-HC) 2.5 % rectal cream Place 1 application rectally 2 (two) times daily. FOR UP TO 10 DAYS AT A TIME Patient taking differently: Place 1 application rectally 2 (two) times daily as needed for hemorrhoids or itching. FOR UP TO 10 DAYS AT A TIME 06/20/16  Yes Carlis Stable, NP  metoprolol succinate (TOPROL-XL) 50 MG 24 hr tablet Take 1 tablet (50 mg total) by mouth daily. 04/09/16  Yes Arnoldo Lenis, MD  Multiple Vitamin (MULTIVITAMIN) tablet Take 1 tablet by mouth daily.     Yes Historical Provider, MD  omeprazole (PRILOSEC) 20 MG capsule Take 1 capsule (20 mg total) by mouth daily. 12/16/15  Yes Carlis Stable, NP  PARoxetine (PAXIL) 20 MG tablet Take 20 mg by mouth daily.    Yes Historical Provider, MD  Polyethyl Glycol-Propyl Glycol (SYSTANE ULTRA) 0.4-0.3 % SOLN Place 1 drop into both eyes 3 (three) times daily.   Yes Historical Provider, MD  sodium chloride (OCEAN) 0.65 % SOLN nasal spray Place 1 spray into both nostrils as needed for congestion.   Yes Historical Provider, MD  traMADol (ULTRAM) 50 MG tablet Take 50 mg by mouth every 12 (twelve) hours as needed for moderate pain.    Yes Historical Provider, MD    Allergies  Allergen Reactions  . Hydrocodone-Acetaminophen Hives  . Iohexol      Code: HIVES, Desc: pt. had a severe allergic reaction to IV contrast the last time she was injected and had to be seen in the ER.  Hives and sob.   . Nabumetone Rash  . Prednisone Rash     Review of Systems:               General:                      normal appetite, decreased energy, no weight gain, no weight loss, no fever             Cardiac:  no chest pain with exertion, no chest pain at rest, + SOB with exertion, occasional resting SOB, + occasional PND, no orthopnea, no palpitations, + arrhythmia, + atrial fibrillation, + LE edema, + dizzy spells, no syncope             Respiratory:                 + shortness of breath, no home oxygen, no productive cough, + dry cough, no bronchitis, + wheezing, no hemoptysis, + asthma, no pain with inspiration or cough, no sleep apnea, no CPAP at night             GI:                               mild difficulty swallowing, + reflux, no frequent heartburn, + hiatal hernia, no abdominal pain, no constipation, no diarrhea, + hematochezia, no hematemesis, no melena             GU:                              no dysuria,  no frequency, no urinary tract infection, no hematuria, no kidney stones, no kidney disease             Vascular:                     no pain suggestive of claudication, no pain in feet, + leg cramps, + varicose veins, no DVT, no non-healing foot ulcer             Neuro:                         no stroke, no TIA's, no seizures, + headaches, no temporary blindness one eye,  no slurred speech, no peripheral neuropathy, no chronic pain, no instability of gait, mild memory/cognitive dysfunction             Musculoskeletal:         mild arthritis, mild joint swelling, no myalgias, no difficulty walking, normal mobility              Skin:                            no rash, no itching, no skin infections, no pressure sores or ulcerations             Psych:                         + anxiety, + depression, + nervousness, no unusual recent stress             Eyes:                           no blurry vision, + floaters, no recent vision changes, + wears glasses or contacts             ENT:                            no hearing loss, no loose or painful  teeth, no dentures, last saw dentist 3 or 4 years ago  Hematologic:               + easy bruising, no abnormal bleeding, no clotting disorder, no frequent epistaxis             Endocrine:                   no diabetes, does not check CBG's at home                           Physical Exam:              BP 130/87 (BP Location: Right Arm, Patient Position: Sitting, Cuff Size: Large)   Pulse 95   Resp 18   Ht '5\' 3"'$  (1.6 m)   Wt 155 lb (70.3 kg)   SpO2 98% Comment: ON RA  BMI 27.46 kg/m              General:                      Moderately obese female NAD             HEENT:                       Unremarkable              Neck:                           no JVD, no bruits, no adenopathy              Chest:                          clear to auscultation, symmetrical breath sounds, no wheezes, no rhonchi              CV:                              Irregular rate and rhythm, grade III/VI systolic murmur best LLSB             Abdomen:                    soft, non-tender, no masses              Extremities:                 warm, well-perfused, pulses not palpable, trace LE edema, + varicose veins             Rectal/GU                   Deferred             Neuro:                         Grossly non-focal and symmetrical throughout             Skin:                            Clean and dry, no rashes, no breakdown, + mild changes venous insufficiency both lower legs   Diagnostic Tests:  Transthoracic Echocardiography  Patient: Annalis, Kaczmarczyk MR #: [id number] Study Date: 01/25/2016 Gender:  F Age: 63 Height: 160 cm Weight: 71.7 kg BSA: 1.81 m^2 Pt. Status: Room:  SONOGRAPHER [facility] ATTENDING Kerry Hough, M.D. Berna Spare, M.D. REFERRING Kerry Hough, M.D. PERFORMING [facility], [place]  cc:  ------------------------------------------------------------------- LV EF: 60% -  65%  ------------------------------------------------------------------- History: PMH: Murmur. Atrial fibrillation. Mitral valve disease. Risk factors: Diabetes mellitus. Dyslipidemia.  ------------------------------------------------------------------- Study Conclusions  - Left ventricle: The cavity size was normal. Wall thickness was normal. Systolic function was normal. The estimated ejection fraction was in the range of 60% to 65%. Wall motion was normal; there were no regional wall motion abnormalities. The study is not technically sufficient to allow evaluation of LV diastolic function. Doppler parameters are consistent with high ventricular filling pressure. - Mitral valve: Calcified annulus. There was moderate to severe (closer to severe) eccentric regurgitation. - Left atrium: The atrium was severely dilated. - Right atrium: The atrium was moderately dilated. - Tricuspid valve: There was moderate-severe regurgitation. - Pulmonary arteries: PA peak pressure: 40 mm Hg (S).  ------------------------------------------------------------------- Labs, prior tests, procedures, and surgery: Echocardiography (January 2017). The mitral valve showed moderate to severe regurgitation. EF was 65%.  ------------------------------------------------------------------- Study data: The previous study was not available, so comparison was made to the report of January 2017. Study status: Routine. Procedure: Transthoracic echocardiography. Image quality was adequate. Study completion: There were no complications. Transthoracic echocardiography. M-mode, complete 2D, spectral Doppler, and color Doppler. Birthdate: Patient birthdate: Feb 10, 1941. Age: Patient is 76 yr old. Sex: Gender: female. BMI: 28 kg/m^2. Blood pressure: 106/70 Patient status: Outpatient. Study date: Study date: 01/25/2016. Study time:  09:48 AM.  -------------------------------------------------------------------  ------------------------------------------------------------------- Left ventricle: The cavity size was normal. Wall thickness was normal. Systolic function was normal. The estimated ejection fraction was in the range of 60% to 65%. Wall motion was normal; there were no regional wall motion abnormalities. The study is not technically sufficient to allow evaluation of LV diastolic function. Doppler parameters are consistent with high ventricular filling pressure.  ------------------------------------------------------------------- Aortic valve: Trileaflet. Doppler: There was no stenosis. There was no regurgitation.  ------------------------------------------------------------------- Aorta: Aortic root: The aortic root was normal in size. Ascending aorta: The ascending aorta was normal in size.  ------------------------------------------------------------------- Mitral valve: Calcified annulus. Doppler: There was moderate to severe (closer to severe) eccentric regurgitation. Peak gradient (D): 10 mm Hg.  ------------------------------------------------------------------- Left atrium: The atrium was severely dilated.  ------------------------------------------------------------------- Atrial septum: No defect or patent foramen ovale was identified.  ------------------------------------------------------------------- Right ventricle: The cavity size was normal. Wall thickness was normal. Systolic function was normal.  ------------------------------------------------------------------- Pulmonic valve: Poorly visualized. Doppler: There was no significant regurgitation.  ------------------------------------------------------------------- Tricuspid valve: Normal thickness leaflets. Doppler: There was moderate-severe  regurgitation.  ------------------------------------------------------------------- Right atrium: The atrium was moderately dilated.  ------------------------------------------------------------------- Pericardium: There was no pericardial effusion.  ------------------------------------------------------------------- Systemic veins: Inferior vena cava: The vessel was normal in size. The respirophasic diameter changes were in the normal range (>= 50%), consistent with normal central venous pressure.  ------------------------------------------------------------------- Measurements  Left ventricle Value Reference LV ID, ED, PLAX chordal 45 mm 43 - 52 LV ID, ES, PLAX chordal 30.7 mm 23 - 38 LV fx shortening, PLAX chordal 32 % >=29 LV PW thickness, ED 10 mm --------- IVS/LV PW ratio, ED 0.75 <=1.3 Stroke volume, 2D 64 ml --------- Stroke volume/bsa, 2D 35 ml/m^2 --------- LV ejection fraction, 1-p A4C 64 % --------- LV end-diastolic volume, 2-p 52 ml --------- LV end-systolic volume, 2-p 21 ml --------- LV ejection fraction, 2-p 60 % --------- Stroke volume, 2-p 31 ml ---------  LV end-diastolic volume/bsa, 2-p 29 ml/m^2 --------- LV end-systolic volume/bsa, 2-p 12 ml/m^2 --------- Stroke volume/bsa, 2-p 17.3 ml/m^2 --------- LV e&', lateral 10.1 cm/s --------- LV E/e&', lateral 15.84 --------- LV e&', medial 10.1 cm/s --------- LV E/e&', medial 15.84 --------- LV e&', average  10.1 cm/s --------- LV E/e&', average 15.84 ---------  Ventricular septum Value Reference IVS thickness, ED 7.51 mm ---------  LVOT Value Reference LVOT ID, S 19 mm --------- LVOT area 2.84 cm^2 --------- LVOT peak velocity, S 118 cm/s --------- LVOT mean velocity, S 82.2 cm/s --------- LVOT VTI, S 22.5 cm --------- LVOT peak gradient, S 6 mm Hg ---------  Aorta Value Reference Aortic root ID, ED 31 mm ---------  Left atrium Value Reference LA ID, A-P, ES 48 mm --------- LA ID/bsa, A-P (H) 2.66 cm/m^2 <=2.2 LA volume, S 112 ml --------- LA volume/bsa, S 62 ml/m^2 --------- LA volume, ES, 1-p A4C 109 ml --------- LA volume/bsa, ES, 1-p A4C 60.4 ml/m^2 --------- LA volume, ES, 1-p A2C 107 ml --------- LA volume/bsa, ES, 1-p A2C 59.3 ml/m^2 ---------  Mitral valve Value Reference Mitral E-wave peak velocity 160 cm/s --------- Mitral A-wave peak velocity 99.1 cm/s --------- Mitral deceleration time (L) 106 ms 150 - 230 Mitral peak gradient, D 10 mm Hg --------- Mitral E/A ratio, peak 1.6 --------- Mitral regurg VTI, PISA 151 cm --------- Mitral ERO, PISA  0.05 cm^2 --------- Mitral regurg volume, PISA 8 ml ---------  Pulmonary arteries Value Reference PA pressure, S, DP (H) 40 mm Hg <=30  Tricuspid valve Value Reference Tricuspid regurg peak velocity 305 cm/s --------- Tricuspid peak RV-RA gradient 37 mm Hg ---------  Systemic veins Value Reference Estimated CVP 3 mm Hg ---------  Right ventricle Value Reference TAPSE 22.9 mm --------- RV pressure, S, DP (H) 40 mm Hg <=30  Legend: (L) and (H) mark values outside specified reference range.  ------------------------------------------------------------------- Prepared and Electronically Authenticated by  Prentice Docker, MD 2017-10-04T12:01:15   Transesophageal Echocardiography  Patient: Desere, Gwin MR #: [id number] Study Date: 02/20/2016 Gender: F Age: 89 Height: 160 cm Weight: 72.3 kg BSA: 1.81 m^2 Pt. Status: Room: [facility]  ADMITTING Patrick Jupiter, M.D. ATTENDING Patrick Jupiter, M.D. Lisette Abu, M.D. REFERRING Patrick Jupiter, M.D. PERFORMING [facility] SONOGRAPHER Celesta Gentile, RCS  cc:  ------------------------------------------------------------------- LV EF: 60% - 65%  ------------------------------------------------------------------- Indications: Mitral regurgitation 424.0.  ------------------------------------------------------------------- History: Risk factors: Carcinoma in situ of colon, GERD. Diabetes mellitus. Dyslipidemia.  ------------------------------------------------------------------- Study  Conclusions  - Left ventricle: The cavity size was normal. Wall thickness was increased in a pattern of mild LVH. Systolic function was normal. The estimated ejection fraction was in the range of 60% to 65%. There is prolapse of a portion of the anterior leaflet, appears to be the A2 scallop. - Mitral valve: There is prolapse of a portion of the anterior leaflet, appears to be the A2 scallop. The regurgitant jet is very eccentric, not able to quantify with PISA or vena contracta measurements. Probable moderate to severe mitral regurgitation. - Left atrium: The atrium was severely dilated. - Right ventricle: The cavity size was mildly dilated. Wall thickness was normal. - Right atrium: The atrium was moderately to severely dilated. - Atrial septum: No defect or patent foramen ovale was identified. - Tricuspid valve: There was severe regurgitation. - Transgastric images are limited, unable to obtain deep transgastic images. TEE probe would not advance beyond 37 mm.  ------------------------------------------------------------------- Study data: Study status: Routine. Consent: The risks, benefits, and alternatives to the procedure were explained  to the patient and informed consent was obtained. Procedure: The patient reported no pain pre or post test. Initial setup. The patient was brought to the laboratory. Surface ECG leads were monitored. Sedation. Conscious sedation was administered. Transesophageal echocardiography. Topical anesthesia was obtained using viscous lidocaine. A transesophageal probe was inserted by the attending cardiologist. Image quality was adequate. Study completion: The patient tolerated the procedure well. There were no complications. Diagnostic transesophageal echocardiography. 2D and color Doppler. Birthdate: Patient birthdate: [birth year]. Age: Patient is 76 yr old. Sex: Gender: female. BMI: 28.2 kg/m^2.  Blood pressure: 133/74 Patient status: Outpatient. Study date: Study date: 02/20/2016. Study time: 10:01 AM. Location: Endoscopy.  -------------------------------------------------------------------  ------------------------------------------------------------------- Left ventricle: The cavity size was normal. Wall thickness was increased in a pattern of mild LVH. Systolic function was normal. The estimated ejection fraction was in the range of 60% to 65%. There is prolapse of a portion of the anterior leaflet, appears to be the A2 scallop.  ------------------------------------------------------------------- Aortic valve: Trileaflet; normal thickness leaflets.  ------------------------------------------------------------------- Aorta: Aortic root: The aortic root was normal in size. Ascending aorta: The ascending aorta was normal in size.  ------------------------------------------------------------------- Mitral valve: There is prolapse of a portion of the anterior leaflet, appears to be the A2 scallop. The regurgitant jet is very eccentric, not able to quantify with PISA or vena contracta measurements. Probable moderate to severe mitral regurgitation.  ------------------------------------------------------------------- Left atrium: The atrium was severely dilated. LAA emptying velocity is normal at 50 cm/s.  ------------------------------------------------------------------- Atrial septum: No defect or patent foramen ovale was identified.  ------------------------------------------------------------------- Right ventricle: The cavity size was mildly dilated. Wall thickness was normal. Systolic function was normal.  ------------------------------------------------------------------- Pulmonic valve: Not well visualized. Doppler: There was no evidence for stenosis. There was no significant  regurgitation.  ------------------------------------------------------------------- Tricuspid valve: Normal thickness leaflets. Doppler: There was no evidence for stenosis. There was severe regurgitation.  ------------------------------------------------------------------- Right atrium: The atrium was moderately to severely dilated.  ------------------------------------------------------------------- Pericardium: There was no pericardial effusion.  ------------------------------------------------------------------- Measurements  Aorta Value Aortic root ID 31 mm  Legend: (L) and (H) mark values outside specified reference range.  ------------------------------------------------------------------- Prepared and Electronically Authenticated by  Kerry Hough, M.D. 2017-11-01T15:04:31   Right/Left Heart Cath and Coronary Angiography  Conclusion     There is severe (4+) mitral regurgitation.  Hemodynamic findings consistent with mild pulmonary hypertension.  Angiographic minimal coronary disease.  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  The left ventricular ejection fraction is greater than 65% by visual estimate.  Angiographically only minimal CAD. LV gram did confirm severe MR. Relative normal right heart cath pressures with minimal pulmonary hypertension.  Recommendation: The patient will now be referred to cardiac surgery for consultation to discuss mitral valve repair. TR band removal per protocol. The brachial sheath is removed in the Cath Lab.  Okay to discharge later today.   Indications   Moderate to severe mitral regurgitation [I34.0 (ICD-10-CM)]  Pre-operative cardiovascular examination, valvular heart disease [Z01.810, [email or web address] (ICD-10-CM)]  Procedural Details/Technique   Technical Details PCP: Glenda Chroman, MD CARDIOLOGIST: Dr. [name]  76 year old woman with progression of  mitral valve disease now with moderate severe MR and LV dilation. She is referred for right now for catheterization hard of preop-evaluation for possible Mitral Valve Repair.  Time Out: Verified patient identification, verified procedure, site/side was marked, verified correct patient position, special equipment/implants available, medications/allergies/relevent history reviewed, required imaging and test results available. Performed.   Access:  RIGHT Radial Artery: 6 Fr sheath --  Seldinger technique using Angiocath Micropuncture Kit 10 mL radial cocktail IA; 4000 Units IV Heparin Right Brachial/Antecubital Vein: The existing 18-gauge IV was exchanged over a wire for a 5Fr short sheath  Right Heart Catheterization: 5 Fr Gordy Councilman catheter advanced under fluoroscopy with balloon inflated to the RA, RV, then PCWP-PA for hemodynamic measurement.  Simultaneous FA & PA blood gases checked for SaO2% to calculate FICK CO/CI  Catheter removed completely out of the body with balloon deflated.  Left Heart Catheterization: 5 Fr Catheters advanced or exchanged over a J-wire under direct fluoroscopic guidance into the ascending aorta; TIG 4.0 catheter advanced first.  LV Hemodynamics (LV Gram): Angled pigtail catheter Left Coronary Artery Cineangiography: TIG 4.0 Catheter  Right Coronary Artery Cineangiography: TIG 4.0 Catheter  Following angiography, the catheter was removed completely out of body over wire without complication.  Femoral / Brachial Sheath(s) removed in the Cath Lab with manual pressure for hemostasis.   Radial sheath removed in the Cardiac Catheterization lab with TR Band placed for hemostasis.  TR Band: 0940 Hours; 16 mL air  MEDICATIONS * 25 g IV fentanyl, 1 mgIV Versed * SQ Lidocaine 66m * Radial Cocktail: 3 mg verapamil in 10 mL NS * Isovue Contrast: 70 mL * Heparin: 4000 Units   Estimated blood loss <50 mL.  During this procedure the patient was administered the  following to achieve and maintain moderate conscious sedation: Versed 1 mg, Fentanyl 25 mcg, while the patient's heart rate, blood pressure, and oxygen saturation were continuously monitored. The period of conscious sedation was 42 minutes, of which I was present face-to-face 100% of this time.    Complications   Complications documented before study signed (04/12/2016 9:53 AM EST)    No complications were associated with this study.  Documented by DLeonie Man MD - 04/12/2016 9:51 AM EST    Coronary Findings   Dominance: Co-dominant  Left Main  Vessel is large. Vessel is angiographically normal.  Left Anterior Descending  Vessel is angiographically normal.  Dist LAD lesion, 15% stenosed. The lesion is smooth. Myocardial bridging  First Diagonal Branch  Vessel is small in size. Vessel is angiographically normal.  First Septal Branch  Vessel is small in size.  Second Diagonal Branch  Vessel is moderate in size. Vessel is angiographically normal.  Second Septal Branch  Vessel is moderate in size.  Third Diagonal Branch  Vessel is small in size.  Third Septal Branch  Vessel is small in size.  Ramus Intermedius  Vessel is small.  Left Circumflex  Vessel is angiographically normal.  First Obtuse Marginal Branch  Vessel is angiographically normal.  Second Obtuse Marginal Branch  Vessel is moderate in size. Left posterolateral branch  Lateral Second Obtuse Marginal Branch  Vessel is small in size.  Third Obtuse Marginal Branch  Vessel is small in size. Actually is a left posterolateral branch  Right Coronary Artery  Vessel is large. Vessel is angiographically normal.  Acute Marginal Branch  Vessel is small in size.  Right Posterior Descending Artery  Vessel is moderate in size.  Inferior Septal  Vessel is small in size.  Right Heart   Right Heart Pressures Hemodynamic findings consistent with mild pulmonary hypertension. PAP/mean: 37/17/26 mmHg Elevated LV EDP  consistent with volume overload. Mildly elevated at 14 mmHg. AO pressure/mean: 118/67/90 mmHg LV pressure/EDP: 122/7/14 mmHg PCWP: 15/19/13 mmHg  No shunt. PVR 217 D/S. 380 2/D/DI PA sat average 66%, AO sat 98% CARDIAC OUTPUT/INDEX: 4.79/2.74    Right Atrium  The right atrial size is normal. Right atrial pressure is normal. 9 mmHg    Right Ventricle RV pressure/mean: 39/6/11 mmHg    Wall Motion              Left Heart   Left Ventricle The left ventricular size is normal. The left ventricular systolic function is normal. LV end diastolic pressure is mildly elevated. The left ventricular ejection fraction is greater than 65% by visual estimate. No regional wall motion abnormalities.    Mitral Valve There is severe (4+) mitral regurgitation. Cannot detect    Aortic Valve There is no aortic valve stenosis. There is normal aortic valve motion.    Coronary Diagrams   Diagnostic Diagram     Implants        No implant documentation for this case.  PACS Images   Show images for Cardiac catheterization   Link to Procedure Log   Procedure Log    Hemo Data   Flowsheet Row Most Recent Value  Fick Cardiac Output 4.79 L/min  Fick Cardiac Output Index 2.74 (L/min)/BSA  RA A Wave 9 mmHg  RA V Wave 12 mmHg  RA Mean 9 mmHg  RV Systolic Pressure 39 mmHg  RV Diastolic Pressure 6 mmHg  RV EDP 11 mmHg  PA Systolic Pressure 37 mmHg  PA Diastolic Pressure 17 mmHg  PA Mean 26 mmHg  PW A Wave 15 mmHg  PW V Wave 19 mmHg  PW Mean 13 mmHg  AO Systolic Pressure 118 mmHg  AO Diastolic Pressure 67 mmHg  AO Mean 90 mmHg  LV Systolic Pressure 127 mmHg  LV Diastolic Pressure 7 mmHg  LV EDP 15 mmHg  Arterial Occlusion Pressure Extended Systolic Pressure 118 mmHg  Arterial Occlusion Pressure Extended Diastolic Pressure 64 mmHg  Arterial Occlusion Pressure Extended Mean Pressure 85 mmHg  Left Ventricular Apex Extended Systolic Pressure 122 mmHg  Left Ventricular Apex  Extended Diastolic Pressure 7 mmHg  Left Ventricular Apex Extended EDP Pressure 14 mmHg  QP/QS 1  TPVR Index 9.49 HRUI  TSVR Index 32.85 HRUI  PVR SVR Ratio 0.16  TPVR/TSVR Ratio 0.29      CT ANGIOGRAPHY CHEST, ABDOMEN AND PELVIS  TECHNIQUE: Multidetector CT imaging through the chest, abdomen and pelvis was performed using the standard protocol during bolus administration of intravenous contrast. Multiplanar reconstructed images and MIPs were obtained and reviewed to evaluate the vascular anatomy.  CONTRAST: 100 cc Isovue 370  COMPARISON: None.  FINDINGS: CTA CHEST FINDINGS  Cardiovascular: No evidence of intramural hematoma, aortic dissection, or aortic aneurysm. Great vessels grossly patent. Left atrium is moderately enlarged.  Mediastinum/Nodes: No abnormal adenopathy. Stable large hiatal hernia.  Lungs/Pleura: No pneumothorax. No pleural effusion.  5 mm right lower lobe nodule on image 47 is stable supporting benign etiology.  There is an 8 mm spiculated density in the right middle lobe on image 28 of series 13. There are both solid and sub solid components.  Subsegmental atelectasis towards the lung bases.  Musculoskeletal: No chest wall abnormality. No acute or significant osseous findings.  Review of the MIP images confirms the above findings.  CTA ABDOMEN AND PELVIS FINDINGS  VASCULAR  Aorta: Non aneurysmal and patent. No evidence of dissection.  Celiac: Patent.  SMA: Patent.  Renals: A single right renal artery and 2 left renal arteries are patent.  IMA: Patent and diminutive.  Inflow: Common, internal, and external iliac arteries are patent.  Veins: Non-opacified.  Review of the MIP images confirms the above findings.  NON-VASCULAR  Hepatobiliary: Calcified granuloma in the posterior segment of the right lobe. Unremarkable gallbladder.  Pancreas: Unremarkable. No pancreatic ductal dilatation  or surrounding inflammatory changes.  Spleen: Normal in size without focal abnormality.  Adrenals/Urinary Tract: A few hypodensities in the kidneys are not significantly changed. Adrenal glands are unremarkable.  Stomach/Bowel: Large hiatal hernia. No obvious focal mass in the colon. No evidence of small-bowel obstruction. Moderate stool burden throughout the colon. Postoperative changes at the distal sigmoid colon are noted. There is no evidence of malignancy at the suture site.  Lymphatic: No abnormal retroperitoneal adenopathy. A few small para-aortic lymph nodes are present.  Reproductive: Uterus and adnexa are unremarkable.  Other: There is no free fluid. Ventral hernia repair with mesh is noted. Small right inguinal hernia contains adipose tissue.  Musculoskeletal: Scoliosis. No vertebral compression deformity in the lumbar spine.  Review of the MIP images confirms the above findings.  IMPRESSION: There is no evidence of aortic dissection, aneurysm, or intramural hematoma. No significant arterial occlusive disease. No acute vascular pathology in the abdomen or pelvis.  There is a new 8 mm indeterminate right middle lobe pulmonary opacity. Three-month follow-up is recommended to ensure resolution. Initial follow-up by chest CT without contrast is recommended in 3 months to confirm persistence. This recommendation follows the consensus statement: Recommendations for the Management of Subsolid Pulmonary Nodules Detected at CT: A Statement from the [facility] as published in Radiology 2013; 266:304-317.  Large hiatal hernia is not significantly changed.  Postoperative changes.   Electronically Signed By: Marybelle Killings M.D. On: 05/08/2016 11:20   Pulmonary Function Tests  Baseline                                                                      Post-bronchodilator  FVC                 1.79 L  (70% predicted)          FVC                  2.16 L  (85% predicted) FEV1               1.19 L  (63% predicted)          FEV1               1.47 L  (77% predicted) FEF25-75        0.88 L  (68% predicted)          FEF25-75        1.37 L  (92% predicted)  TLC                 4.84 L  (101% predicted) RV                   2.69 L  (121% predicted) DLCO              65% predicted   Impression:  Patient has stage D severe symptomatic primary mitral regurgitation. 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 She remains in rate-controlled persistent atrial fibrillation. I have personally reviewed the patient's recent echocardiograms,diagnostic cardiac catheterization, CT angiogram and PFT's. Transthoracic and transesophageal echocardiograms revealed the presence of mitral valve prolapse with an eccentric jet of mitral regurgitation that is probably severe. 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 There is some posterior annulus calcification but overall both leaflets appear to move reasonably well. Left ventricular systolic function appears close to normal.  There is severe left atrial enlargement and severe tricuspid regurgitation. The tricuspid annulus is dilated. The tricuspid leaflets are not well visualized. Diagnostic cardiac catheterization is notable for the absence of significant coronary artery disease. CT angiogram reveals no significant contraindications to an attempt at minimally invasive approach using peripheral arterial cannulation. Question was raised regarding a small (8 mm) vague opacity in the right middle lobe. I am personally not at all impressed that there is anything of significance but it may be reasonable to obtain a  repeat limited CT scan prior to surgery. 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 I agree the patient would benefit from mitral valve repair and Maze procedure. She may need tricuspid valve repair. It is possible that she might be candidate for minimally invasive approach. Risks of surgery will be somewhat elevated due to her advanced age, comorbid medical problems, and sedentary lifestyle.    Plan:  I have again reviewed the indications, risks, and potential benefits of mitral valve repair, tricuspid valve repair, and Maze procedure with the patient and her husband in the office today. Expectations for the patient's postoperative convalescence at been discussed.  The rationale for elective surgery has been explained, including a comparison between surgery and continued medical therapy with close follow-up.  The likelihood of successful and durable valve repair has been discussed with particular reference to the findings of their recent echocardiogram.  Based upon these findings and previous experience, I have quoted them a greater than 90 percent likelihood of successful valve repair.  In the unlikely event that their valve cannot be successfully repaired, we discussed the possibility of replacing the mitral valve using a mechanical prosthesis with the attendant need for long-term anticoagulation versus the alternative of replacing it using a bioprosthetic tissue valve with its potential for late structural valve deterioration and failure, depending upon the patient's longevity.  The patient specifically requests that if the mitral valve must be replaced that it be done using a bioprosthetic valve.   The patient understands and accepts all potential risks of surgery including but not limited to risk of death, stroke or other neurologic complication, myocardial  infarction, congestive heart failure, respiratory failure, renal failure, bleeding requiring transfusion and/or reexploration, arrhythmia, infection or other wound complications, pneumonia, pleural and/or pericardial effusion, pulmonary embolus, aortic dissection or other major vascular complication, or delayed complications related to valve repair or replacement including but not limited to structural valve deterioration and failure, thrombosis, embolization, endocarditis, or paravalvular leak.  Alternative surgical approaches have been discussed including a comparison between conventional sternotomy and minimally-invasive techniques.  The relative risks and benefits of each have been reviewed as they pertain to the patient's specific circumstances, and all of their questions have been addressed.  Specific risks potentially related to the minimally-invasive approach were discussed at length, including but not limited to risk of conversion to full or partial sternotomy, aortic  dissection or other major vascular complication, unilateral acute lung injury or pulmonary edema, phrenic nerve dysfunction or paralysis, rib fracture, chronic pain, lung hernia, or lymphocele. All of their questions have been answered.  The patient has been instructed to stop taking Xarelto on Thursday, 06/28/2016. In addition, she has been given a new prescription for amiodarone to begin prior to surgery. She has been instructed to stop taking Zocor while she is taking amiodarone. On the morning of surgery she has been instructed to take only Toprol-XL and Prilosec with a sip of water.    I spent in excess of 30 minutes during the conduct of this office consultation and >50% of this time involved direct face-to-face encounter with the patient for counseling and/or coordination of their care.    Valentina Gu. Roxy Manns, MD 06/25/2016 4:18 PM

## 2016-07-03 ENCOUNTER — Encounter (HOSPITAL_COMMUNITY): Payer: Self-pay

## 2016-07-03 ENCOUNTER — Encounter (HOSPITAL_COMMUNITY)
Admission: RE | Admit: 2016-07-03 | Discharge: 2016-07-03 | Disposition: A | Discharge status: Home / Self Care | Payer: Medicare Other | Source: Ambulatory Visit | Attending: Thoracic Surgery (Cardiothoracic Vascular Surgery) | Admitting: Thoracic Surgery (Cardiothoracic Vascular Surgery)

## 2016-07-03 ENCOUNTER — Ambulatory Visit
Admission: RE | Admit: 2016-07-03 | Discharge: 2016-07-03 | Disposition: A | Discharge status: Home / Self Care | Payer: Medicare Other | Source: Ambulatory Visit | Attending: Thoracic Surgery (Cardiothoracic Vascular Surgery) | Admitting: Thoracic Surgery (Cardiothoracic Vascular Surgery)

## 2016-07-03 ENCOUNTER — Ambulatory Visit (HOSPITAL_COMMUNITY)
Admission: RE | Admit: 2016-07-03 | Discharge: 2016-07-03 | Disposition: A | Discharge status: Home / Self Care | Payer: Medicare Other | Source: Ambulatory Visit | Attending: Thoracic Surgery (Cardiothoracic Vascular Surgery) | Admitting: Thoracic Surgery (Cardiothoracic Vascular Surgery)

## 2016-07-03 ENCOUNTER — Ambulatory Visit (HOSPITAL_BASED_OUTPATIENT_CLINIC_OR_DEPARTMENT_OTHER)
Admission: RE | Admit: 2016-07-03 | Discharge: 2016-07-03 | Disposition: A | Discharge status: Home / Self Care | Payer: Medicare Other | Source: Ambulatory Visit | Attending: Thoracic Surgery (Cardiothoracic Vascular Surgery) | Admitting: Thoracic Surgery (Cardiothoracic Vascular Surgery)

## 2016-07-03 DIAGNOSIS — R911 Solitary pulmonary nodule: Secondary | ICD-10-CM

## 2016-07-03 DIAGNOSIS — I071 Rheumatic tricuspid insufficiency: Secondary | ICD-10-CM

## 2016-07-03 DIAGNOSIS — I4891 Unspecified atrial fibrillation: Secondary | ICD-10-CM | POA: Diagnosis not present

## 2016-07-03 DIAGNOSIS — I340 Nonrheumatic mitral (valve) insufficiency: Secondary | ICD-10-CM

## 2016-07-03 DIAGNOSIS — I6523 Occlusion and stenosis of bilateral carotid arteries: Secondary | ICD-10-CM

## 2016-07-03 DIAGNOSIS — I700 Atherosclerosis of aorta: Secondary | ICD-10-CM | POA: Insufficient documentation

## 2016-07-03 DIAGNOSIS — K449 Diaphragmatic hernia without obstruction or gangrene: Secondary | ICD-10-CM | POA: Insufficient documentation

## 2016-07-03 DIAGNOSIS — R0602 Shortness of breath: Secondary | ICD-10-CM | POA: Diagnosis not present

## 2016-07-03 DIAGNOSIS — I272 Pulmonary hypertension, unspecified: Secondary | ICD-10-CM | POA: Diagnosis not present

## 2016-07-03 DIAGNOSIS — D62 Acute posthemorrhagic anemia: Secondary | ICD-10-CM | POA: Diagnosis not present

## 2016-07-03 DIAGNOSIS — J9811 Atelectasis: Secondary | ICD-10-CM | POA: Diagnosis not present

## 2016-07-03 DIAGNOSIS — I481 Persistent atrial fibrillation: Secondary | ICD-10-CM | POA: Diagnosis not present

## 2016-07-03 DIAGNOSIS — I081 Rheumatic disorders of both mitral and tricuspid valves: Secondary | ICD-10-CM | POA: Diagnosis not present

## 2016-07-03 DIAGNOSIS — J939 Pneumothorax, unspecified: Secondary | ICD-10-CM | POA: Diagnosis not present

## 2016-07-03 DIAGNOSIS — I5032 Chronic diastolic (congestive) heart failure: Secondary | ICD-10-CM | POA: Diagnosis not present

## 2016-07-03 DIAGNOSIS — N390 Urinary tract infection, site not specified: Secondary | ICD-10-CM | POA: Diagnosis not present

## 2016-07-03 DIAGNOSIS — R05 Cough: Secondary | ICD-10-CM | POA: Diagnosis not present

## 2016-07-03 DIAGNOSIS — I110 Hypertensive heart disease with heart failure: Secondary | ICD-10-CM | POA: Diagnosis not present

## 2016-07-03 DIAGNOSIS — E8770 Fluid overload, unspecified: Secondary | ICD-10-CM | POA: Diagnosis not present

## 2016-07-03 DIAGNOSIS — R443 Hallucinations, unspecified: Secondary | ICD-10-CM | POA: Diagnosis not present

## 2016-07-03 DIAGNOSIS — K222 Esophageal obstruction: Secondary | ICD-10-CM | POA: Diagnosis not present

## 2016-07-03 HISTORY — DX: Depression, unspecified: F32.A

## 2016-07-03 HISTORY — DX: Headache, unspecified: R51.9

## 2016-07-03 HISTORY — DX: Headache: R51

## 2016-07-03 HISTORY — DX: Major depressive disorder, single episode, unspecified: F32.9

## 2016-07-03 HISTORY — DX: Anxiety disorder, unspecified: F41.9

## 2016-07-03 HISTORY — DX: Unspecified osteoarthritis, unspecified site: M19.90

## 2016-07-03 LAB — BLOOD GAS, ARTERIAL
Acid-Base Excess: 3.7 mmol/L — ABNORMAL HIGH (ref 0.0–2.0)
Bicarbonate: 28.1 mmol/L — ABNORMAL HIGH (ref 20.0–28.0)
DRAWN BY: 421801
FIO2: 0.21
O2 Saturation: 94 %
PATIENT TEMPERATURE: 98.6
PH ART: 7.402 (ref 7.350–7.450)
pCO2 arterial: 46.1 mmHg (ref 32.0–48.0)
pO2, Arterial: 70.9 mmHg — ABNORMAL LOW (ref 83.0–108.0)

## 2016-07-03 LAB — COMPREHENSIVE METABOLIC PANEL
ALBUMIN: 3.8 g/dL (ref 3.5–5.0)
ALT: 19 U/L (ref 14–54)
AST: 23 U/L (ref 15–41)
Alkaline Phosphatase: 113 U/L (ref 38–126)
Anion gap: 12 (ref 5–15)
BILIRUBIN TOTAL: 0.5 mg/dL (ref 0.3–1.2)
BUN: 10 mg/dL (ref 6–20)
CHLORIDE: 101 mmol/L (ref 101–111)
CO2: 24 mmol/L (ref 22–32)
CREATININE: 0.67 mg/dL (ref 0.44–1.00)
Calcium: 8.9 mg/dL (ref 8.9–10.3)
GFR calc Af Amer: >60 mL/min (ref >60)
GLUCOSE: 107 mg/dL — AB (ref 65–99)
POTASSIUM: 4.2 mmol/L (ref 3.5–5.1)
Sodium: 137 mmol/L (ref 135–145)
Total Protein: 6.7 g/dL (ref 6.5–8.1)

## 2016-07-03 LAB — URINALYSIS, ROUTINE W REFLEX MICROSCOPIC
BILIRUBIN URINE: NEGATIVE
Glucose, UA: NEGATIVE mg/dL
Hgb urine dipstick: NEGATIVE
KETONES UR: NEGATIVE mg/dL
LEUKOCYTES UA: NEGATIVE
NITRITE: NEGATIVE
PH: 7 (ref 5.0–8.0)
PROTEIN: NEGATIVE mg/dL
Specific Gravity, Urine: 1.013 (ref 1.005–1.030)

## 2016-07-03 LAB — VAS US DOPPLER PRE CABG
LEFT ECA DIAS: 18 cm/s
LEFT VERTEBRAL DIAS: 21 cm/s
LICADDIAS: -32 cm/s
LICAPSYS: -53 cm/s
Left CCA dist dias: -22 cm/s
Left CCA dist sys: -68 cm/s
Left CCA prox dias: 27 cm/s
Left CCA prox sys: 125 cm/s
Left ICA dist sys: -76 cm/s
Left ICA prox dias: -17 cm/s
RCCADSYS: -85 cm/s
RIGHT ECA DIAS: -18 cm/s
RIGHT VERTEBRAL DIAS: 9 cm/s
Right CCA prox dias: 19 cm/s
Right CCA prox sys: 81 cm/s

## 2016-07-03 LAB — CBC
HEMATOCRIT: 37.9 % (ref 36.0–46.0)
Hemoglobin: 12 g/dL (ref 12.0–15.0)
MCH: 30 pg (ref 26.0–34.0)
MCHC: 31.7 g/dL (ref 30.0–36.0)
MCV: 94.8 fL (ref 78.0–100.0)
PLATELETS: 266 10*3/uL (ref 150–400)
RBC: 4 MIL/uL (ref 3.87–5.11)
RDW: 13.3 % (ref 11.5–15.5)
WBC: 7.6 10*3/uL (ref 4.0–10.5)

## 2016-07-03 LAB — SURGICAL PCR SCREEN
MRSA, PCR: NEGATIVE
Staphylococcus aureus: POSITIVE — AB

## 2016-07-03 LAB — PROTIME-INR
INR: 1.04
PROTHROMBIN TIME: 13.6 s (ref 11.4–15.2)

## 2016-07-03 LAB — APTT: aPTT: 31 seconds (ref 24–36)

## 2016-07-03 LAB — ABO/RH: ABO/RH(D): O POS

## 2016-07-03 NOTE — Progress Notes (Signed)
I called a prescription for Mupirocin ointment to [facility], [place], [place], [place]

## 2016-07-03 NOTE — Progress Notes (Signed)
Pre-op Cardiac Surgery  Carotid Findings:  Findings suggest 1-39% internal carotid artery stenosis bilaterally. Vertebral arteries are patent with antegrade flow.  Upper Extremity Right Left  Brachial Pressures 131-Triphasic 135-Triphasic  Radial Waveforms Triphasic Triphasic  Ulnar Waveforms Triphasic Triphasic  Palmar Arch (Allen's Test) Signal obliterates with radial compression, is unaffected with ulnar compression. Signal reverses with radial compression, is unaffected with ulnar compression.   07/03/2016 10:49 AM Maudry Mayhew, BS, RVT, RDCS, RDMS

## 2016-07-03 NOTE — Pre-Procedure Instructions (Signed)
    [name] [name]  07/03/2016     Your procedure is scheduled on Thursday, March 15.  Report to [facility] Admitting at 5:30 AM              For any other questions, please call [phone], Monday - Friday 8 AM - 4 PM.   Call this number if you have problems the morning of surgery:[phone]   Remember:  Do not eat food or drink liquids after midnight Wednesday, March 14.  Take these medicines the morning of surgery with A SIP OF WATER: amiodarone (PACERONE), metoprolol succinate (TOPROL-XL), omeprazole (PRILOSEC), PARoxetine (PAXIL).   May use Albuterol if needed and bring it to the hospital with you.  May use eye drops.             May take if needed:  ALPRAZolam [name]),  traMADol [name]). STOP taking Aspirin , Aspirin Products (Goody Powder, Excedrin Migraine), Ibuprofen (Advil), Naproxen (Aleve), Vitamins and Herbal Products (ie Fish Oil)           Do not wear jewelry, make-up or nail polish.  Do not wear lotions, powders, or perfumes, or deodorant.  Do not shave 48 hours prior to surgery.  Men may shave face and neck.  Do not bring valuables to the hospital.  [facility] is not responsible for any belongings or valuables.  Contacts, dentures or bridgework may not be worn into surgery.  Leave your suitcase in the car.  After surgery it may be brought to your room.  For patients admitted to the hospital, discharge time will be determined by your treatment team.  Special instructions: Review   - Preparing For Surgery.  Please read over the following fact sheets that you were given: [facility]- Preparing For Surgery and Patient Instructions for Mupirocin Application, Incentive Spirometry, Pain Booklet

## 2016-07-04 ENCOUNTER — Encounter (HOSPITAL_COMMUNITY): Payer: Self-pay | Admitting: Certified Registered Nurse Anesthetist

## 2016-07-04 LAB — HEMOGLOBIN A1C
HEMOGLOBIN A1C: 5.6 % (ref 4.8–5.6)
Mean Plasma Glucose: 114 mg/dL

## 2016-07-04 MED ORDER — TRANEXAMIC ACID (OHS) PUMP PRIME SOLUTION
2.0000 mg/kg | INTRAVENOUS | Status: DC
  Filled 2016-07-04: qty 1.44

## 2016-07-04 MED ORDER — PLASMA-LYTE 148 IV SOLN
INTRAVENOUS | Status: DC
  Filled 2016-07-04: qty 2.5

## 2016-07-04 MED ORDER — VANCOMYCIN HCL 1000 MG IV SOLR
INTRAVENOUS | Status: AC
  Administered 2016-07-05: 1000 mL
  Filled 2016-07-04: qty 1000

## 2016-07-04 MED ORDER — MAGNESIUM SULFATE 50 % IJ SOLN
40.0000 meq | INTRAMUSCULAR | Status: DC
  Filled 2016-07-04: qty 10

## 2016-07-04 MED ORDER — MANNITOL 20% IV SOLUTION 10G/50ML
6.4000 g | INTRAVENOUS | Status: DC
  Filled 2016-07-04: qty 60

## 2016-07-04 MED ORDER — HEPARIN SODIUM (PORCINE) 1000 UNIT/ML IJ SOLN
INTRAMUSCULAR | Status: DC
  Filled 2016-07-04: qty 30

## 2016-07-04 MED ORDER — POTASSIUM CHLORIDE 2 MEQ/ML IV SOLN
80.0000 meq | INTRAVENOUS | Status: DC
  Filled 2016-07-04: qty 40

## 2016-07-04 MED ORDER — GLUTARALDEHYDE 0.625% SOAKING SOLUTION
TOPICAL | Status: DC | PRN
  Filled 2016-07-04: qty 50

## 2016-07-04 MED ORDER — EPINEPHRINE PF 1 MG/ML IJ SOLN
0.0000 ug/min | INTRAVENOUS | Status: DC
  Filled 2016-07-04: qty 4

## 2016-07-04 MED ORDER — DOPAMINE-DEXTROSE 3.2-5 MG/ML-% IV SOLN
0.0000 ug/kg/min | INTRAVENOUS | Status: DC
  Filled 2016-07-04: qty 250

## 2016-07-04 MED ORDER — CEFUROXIME SODIUM 1.5 G IJ SOLR
1.5000 g | INTRAMUSCULAR | Status: AC
  Administered 2016-07-05: .75 g via INTRAVENOUS
  Administered 2016-07-05: 1.5 g via INTRAVENOUS
  Filled 2016-07-04: qty 1.5

## 2016-07-04 MED ORDER — SODIUM CHLORIDE 0.9 % IV SOLN
30.0000 ug/min | INTRAVENOUS | Status: AC
  Administered 2016-07-05: 15 ug/min via INTRAVENOUS
  Filled 2016-07-04: qty 2

## 2016-07-04 MED ORDER — SODIUM CHLORIDE 0.9 % IV SOLN
1.5000 mg/kg/h | INTRAVENOUS | Status: AC
  Administered 2016-07-05: 1.5 mg/kg/h via INTRAVENOUS
  Filled 2016-07-04: qty 25

## 2016-07-04 MED ORDER — LIDOCAINE HCL (CARDIAC) 20 MG/ML IV SOLN
260.0000 mg | INTRAVENOUS | Status: DC
  Filled 2016-07-04: qty 15

## 2016-07-04 MED ORDER — METOPROLOL TARTRATE 12.5 MG HALF TABLET: 12.5000 mg | ORAL_TABLET | Freq: Once | ORAL | Status: DC

## 2016-07-04 MED ORDER — NITROGLYCERIN IN D5W 200-5 MCG/ML-% IV SOLN
2.0000 ug/min | INTRAVENOUS | Status: AC
  Administered 2016-07-05: 10 ug/min via INTRAVENOUS
  Filled 2016-07-04: qty 250

## 2016-07-04 MED ORDER — DEXMEDETOMIDINE HCL IN NACL 400 MCG/100ML IV SOLN
0.1000 ug/kg/h | INTRAVENOUS | Status: AC
  Administered 2016-07-05: .3 ug/kg/h via INTRAVENOUS
  Filled 2016-07-04: qty 100

## 2016-07-04 MED ORDER — VANCOMYCIN HCL 10 G IV SOLR
1250.0000 mg | INTRAVENOUS | Status: AC
  Administered 2016-07-05: 1250 mg via INTRAVENOUS
  Filled 2016-07-04: qty 1250

## 2016-07-04 MED ORDER — TRANEXAMIC ACID (OHS) BOLUS VIA INFUSION
15.0000 mg/kg | INTRAVENOUS | Status: AC
  Administered 2016-07-05: 1078.5 mg via INTRAVENOUS
  Filled 2016-07-04: qty 1079

## 2016-07-04 MED ORDER — CHLORHEXIDINE GLUCONATE 0.12 % MT SOLN
15.0000 mL | Freq: Once | OROMUCOSAL | Status: AC
  Administered 2016-07-05: 15 mL via OROMUCOSAL
  Filled 2016-07-04: qty 15

## 2016-07-04 MED ORDER — CEFUROXIME SODIUM 750 MG IJ SOLR
750.0000 mg | INTRAMUSCULAR | Status: DC
  Filled 2016-07-04: qty 750

## 2016-07-04 MED ORDER — SODIUM CHLORIDE 0.9 % IV SOLN
INTRAVENOUS | Status: AC
  Administered 2016-07-05: 2 [IU]/h via INTRAVENOUS
  Filled 2016-07-04: qty 2.5

## 2016-07-05 ENCOUNTER — Inpatient Hospital Stay (HOSPITAL_COMMUNITY): Payer: Medicare Other

## 2016-07-05 ENCOUNTER — Inpatient Hospital Stay (HOSPITAL_COMMUNITY)
Admission: RE | Admit: 2016-07-05 | Discharge: 2016-07-14 | DRG: 220 | Disposition: A | Discharge status: Home Health | Payer: Medicare Other | Source: Ambulatory Visit | Attending: Thoracic Surgery (Cardiothoracic Vascular Surgery) | Admitting: Thoracic Surgery (Cardiothoracic Vascular Surgery)

## 2016-07-05 ENCOUNTER — Encounter (HOSPITAL_COMMUNITY)
Admission: RE | Disposition: A | Discharge status: Home Health | Payer: Self-pay | Source: Ambulatory Visit | Attending: Thoracic Surgery (Cardiothoracic Vascular Surgery)

## 2016-07-05 ENCOUNTER — Inpatient Hospital Stay (HOSPITAL_COMMUNITY): Payer: Medicare Other | Admitting: Certified Registered Nurse Anesthetist

## 2016-07-05 ENCOUNTER — Encounter (HOSPITAL_COMMUNITY): Payer: Self-pay | Admitting: Urology

## 2016-07-05 DIAGNOSIS — Z9689 Presence of other specified functional implants: Secondary | ICD-10-CM

## 2016-07-05 DIAGNOSIS — R443 Hallucinations, unspecified: Secondary | ICD-10-CM | POA: Diagnosis not present

## 2016-07-05 DIAGNOSIS — I4891 Unspecified atrial fibrillation: Secondary | ICD-10-CM | POA: Diagnosis not present

## 2016-07-05 DIAGNOSIS — Z86718 Personal history of other venous thrombosis and embolism: Secondary | ICD-10-CM | POA: Diagnosis not present

## 2016-07-05 DIAGNOSIS — E785 Hyperlipidemia, unspecified: Secondary | ICD-10-CM | POA: Diagnosis present

## 2016-07-05 DIAGNOSIS — I2510 Atherosclerotic heart disease of native coronary artery without angina pectoris: Secondary | ICD-10-CM | POA: Diagnosis present

## 2016-07-05 DIAGNOSIS — N390 Urinary tract infection, site not specified: Secondary | ICD-10-CM | POA: Diagnosis not present

## 2016-07-05 DIAGNOSIS — R51 Headache: Secondary | ICD-10-CM | POA: Diagnosis not present

## 2016-07-05 DIAGNOSIS — Z9889 Other specified postprocedural states: Secondary | ICD-10-CM

## 2016-07-05 DIAGNOSIS — J449 Chronic obstructive pulmonary disease, unspecified: Secondary | ICD-10-CM | POA: Diagnosis present

## 2016-07-05 DIAGNOSIS — K222 Esophageal obstruction: Secondary | ICD-10-CM | POA: Diagnosis present

## 2016-07-05 DIAGNOSIS — Z6828 Body mass index (BMI) 28.0-28.9, adult: Secondary | ICD-10-CM

## 2016-07-05 DIAGNOSIS — I272 Pulmonary hypertension, unspecified: Secondary | ICD-10-CM | POA: Diagnosis present

## 2016-07-05 DIAGNOSIS — I481 Persistent atrial fibrillation: Secondary | ICD-10-CM | POA: Diagnosis not present

## 2016-07-05 DIAGNOSIS — Z85038 Personal history of other malignant neoplasm of large intestine: Secondary | ICD-10-CM | POA: Diagnosis not present

## 2016-07-05 DIAGNOSIS — Z888 Allergy status to other drugs, medicaments and biological substances status: Secondary | ICD-10-CM

## 2016-07-05 DIAGNOSIS — J95811 Postprocedural pneumothorax: Secondary | ICD-10-CM | POA: Diagnosis not present

## 2016-07-05 DIAGNOSIS — E669 Obesity, unspecified: Secondary | ICD-10-CM | POA: Diagnosis present

## 2016-07-05 DIAGNOSIS — I071 Rheumatic tricuspid insufficiency: Secondary | ICD-10-CM | POA: Diagnosis present

## 2016-07-05 DIAGNOSIS — Z4682 Encounter for fitting and adjustment of non-vascular catheter: Secondary | ICD-10-CM

## 2016-07-05 DIAGNOSIS — M3313 Other dermatomyositis without myopathy: Secondary | ICD-10-CM | POA: Diagnosis present

## 2016-07-05 DIAGNOSIS — E8770 Fluid overload, unspecified: Secondary | ICD-10-CM | POA: Diagnosis not present

## 2016-07-05 DIAGNOSIS — I340 Nonrheumatic mitral (valve) insufficiency: Secondary | ICD-10-CM | POA: Diagnosis present

## 2016-07-05 DIAGNOSIS — I5032 Chronic diastolic (congestive) heart failure: Secondary | ICD-10-CM | POA: Diagnosis present

## 2016-07-05 DIAGNOSIS — Z87442 Personal history of urinary calculi: Secondary | ICD-10-CM

## 2016-07-05 DIAGNOSIS — D62 Acute posthemorrhagic anemia: Secondary | ICD-10-CM | POA: Diagnosis not present

## 2016-07-05 DIAGNOSIS — Z8679 Personal history of other diseases of the circulatory system: Secondary | ICD-10-CM

## 2016-07-05 DIAGNOSIS — I110 Hypertensive heart disease with heart failure: Secondary | ICD-10-CM | POA: Diagnosis present

## 2016-07-05 DIAGNOSIS — K5900 Constipation, unspecified: Secondary | ICD-10-CM | POA: Diagnosis not present

## 2016-07-05 DIAGNOSIS — J939 Pneumothorax, unspecified: Secondary | ICD-10-CM | POA: Diagnosis not present

## 2016-07-05 DIAGNOSIS — Z7901 Long term (current) use of anticoagulants: Secondary | ICD-10-CM | POA: Diagnosis not present

## 2016-07-05 DIAGNOSIS — I4819 Other persistent atrial fibrillation: Secondary | ICD-10-CM | POA: Diagnosis present

## 2016-07-05 DIAGNOSIS — J90 Pleural effusion, not elsewhere classified: Secondary | ICD-10-CM | POA: Diagnosis not present

## 2016-07-05 DIAGNOSIS — Z452 Encounter for adjustment and management of vascular access device: Secondary | ICD-10-CM | POA: Diagnosis not present

## 2016-07-05 DIAGNOSIS — I081 Rheumatic disorders of both mitral and tricuspid valves: Principal | ICD-10-CM | POA: Diagnosis present

## 2016-07-05 DIAGNOSIS — Z885 Allergy status to narcotic agent status: Secondary | ICD-10-CM

## 2016-07-05 DIAGNOSIS — J9811 Atelectasis: Secondary | ICD-10-CM | POA: Diagnosis not present

## 2016-07-05 DIAGNOSIS — Z79899 Other long term (current) drug therapy: Secondary | ICD-10-CM

## 2016-07-05 DIAGNOSIS — M1990 Unspecified osteoarthritis, unspecified site: Secondary | ICD-10-CM | POA: Diagnosis present

## 2016-07-05 DIAGNOSIS — K449 Diaphragmatic hernia without obstruction or gangrene: Secondary | ICD-10-CM | POA: Diagnosis present

## 2016-07-05 DIAGNOSIS — Z7722 Contact with and (suspected) exposure to environmental tobacco smoke (acute) (chronic): Secondary | ICD-10-CM | POA: Diagnosis present

## 2016-07-05 DIAGNOSIS — T8182XA Emphysema (subcutaneous) resulting from a procedure, initial encounter: Secondary | ICD-10-CM | POA: Diagnosis not present

## 2016-07-05 DIAGNOSIS — E7800 Pure hypercholesterolemia, unspecified: Secondary | ICD-10-CM | POA: Diagnosis present

## 2016-07-05 DIAGNOSIS — Z9841 Cataract extraction status, right eye: Secondary | ICD-10-CM

## 2016-07-05 DIAGNOSIS — Z9842 Cataract extraction status, left eye: Secondary | ICD-10-CM

## 2016-07-05 DIAGNOSIS — K219 Gastro-esophageal reflux disease without esophagitis: Secondary | ICD-10-CM | POA: Diagnosis present

## 2016-07-05 DIAGNOSIS — R451 Restlessness and agitation: Secondary | ICD-10-CM | POA: Diagnosis not present

## 2016-07-05 DIAGNOSIS — E119 Type 2 diabetes mellitus without complications: Secondary | ICD-10-CM | POA: Diagnosis not present

## 2016-07-05 DIAGNOSIS — F418 Other specified anxiety disorders: Secondary | ICD-10-CM | POA: Diagnosis present

## 2016-07-05 DIAGNOSIS — R911 Solitary pulmonary nodule: Secondary | ICD-10-CM | POA: Diagnosis present

## 2016-07-05 DIAGNOSIS — I82409 Acute embolism and thrombosis of unspecified deep veins of unspecified lower extremity: Secondary | ICD-10-CM | POA: Diagnosis not present

## 2016-07-05 DIAGNOSIS — K649 Unspecified hemorrhoids: Secondary | ICD-10-CM | POA: Diagnosis present

## 2016-07-05 DIAGNOSIS — R0602 Shortness of breath: Secondary | ICD-10-CM | POA: Diagnosis present

## 2016-07-05 HISTORY — PX: TEE WITHOUT CARDIOVERSION: SHX5443

## 2016-07-05 HISTORY — DX: Other specified postprocedural states: Z98.890

## 2016-07-05 HISTORY — PX: CLIPPING OF ATRIAL APPENDAGE: SHX5773

## 2016-07-05 HISTORY — PX: MITRAL VALVE REPAIR: SHX2039

## 2016-07-05 HISTORY — DX: Chronic diastolic (congestive) heart failure: I50.32

## 2016-07-05 HISTORY — DX: Chronic obstructive pulmonary disease, unspecified: J44.9

## 2016-07-05 HISTORY — PX: MINIMALLY INVASIVE MAZE PROCEDURE: SHX6244

## 2016-07-05 HISTORY — DX: Personal history of other diseases of the circulatory system: Z86.79

## 2016-07-05 LAB — CBC
HCT: 31.6 % — ABNORMAL LOW (ref 36.0–46.0)
HCT: 34 % — ABNORMAL LOW (ref 36.0–46.0)
Hemoglobin: 10 g/dL — ABNORMAL LOW (ref 12.0–15.0)
Hemoglobin: 10.5 g/dL — ABNORMAL LOW (ref 12.0–15.0)
MCH: 29.9 pg (ref 26.0–34.0)
MCH: 28.9 pg (ref 26.0–34.0)
MCHC: 31.6 g/dL (ref 30.0–36.0)
MCHC: 30.9 g/dL (ref 30.0–36.0)
MCV: 94.6 fL (ref 78.0–100.0)
MCV: 93.7 fL (ref 78.0–100.0)
PLATELETS: 178 10*3/uL (ref 150–400)
PLATELETS: 195 10*3/uL (ref 150–400)
RBC: 3.34 MIL/uL — ABNORMAL LOW (ref 3.87–5.11)
RBC: 3.63 MIL/uL — AB (ref 3.87–5.11)
RDW: 13.6 % (ref 11.5–15.5)
RDW: 13.4 % (ref 11.5–15.5)
WBC: 13.2 10*3/uL — ABNORMAL HIGH (ref 4.0–10.5)
WBC: 16.4 10*3/uL — AB (ref 4.0–10.5)

## 2016-07-05 LAB — POCT I-STAT 3, ART BLOOD GAS (G3+)
ACID-BASE DEFICIT: 1 mmol/L (ref 0.0–2.0)
ACID-BASE DEFICIT: 2 mmol/L (ref 0.0–2.0)
Acid-Base Excess: 5 mmol/L — ABNORMAL HIGH (ref 0.0–2.0)
Acid-base deficit: 2 mmol/L (ref 0.0–2.0)
Acid-base deficit: 2 mmol/L (ref 0.0–2.0)
BICARBONATE: 24.7 mmol/L (ref 20.0–28.0)
BICARBONATE: 25.4 mmol/L (ref 20.0–28.0)
Bicarbonate: 28.7 mmol/L — ABNORMAL HIGH (ref 20.0–28.0)
Bicarbonate: 27.2 mmol/L (ref 20.0–28.0)
Bicarbonate: 26.3 mmol/L (ref 20.0–28.0)
Bicarbonate: 24.4 mmol/L (ref 20.0–28.0)
O2 SAT: 100 %
O2 SAT: 97 %
O2 SAT: 98 %
O2 Saturation: 91 %
O2 Saturation: 95 %
O2 Saturation: 96 %
PCO2 ART: 38.7 mmHg (ref 32.0–48.0)
PCO2 ART: 57.8 mmHg — AB (ref 32.0–48.0)
PCO2 ART: 64.1 mmHg — AB (ref 32.0–48.0)
PCO2 ART: 48 mmHg (ref 32.0–48.0)
PH ART: 7.281 — AB (ref 7.350–7.450)
PH ART: 7.215 — AB (ref 7.350–7.450)
PH ART: 7.315 — AB (ref 7.350–7.450)
PH ART: 7.348 — AB (ref 7.350–7.450)
PO2 ART: 108 mmHg (ref 83.0–108.0)
PO2 ART: 81 mmHg — AB (ref 83.0–108.0)
PO2 ART: 114 mmHg — AB (ref 83.0–108.0)
Patient temperature: 36.1
Patient temperature: 36.5
TCO2: 30 mmol/L (ref 0–100)
TCO2: 29 mmol/L (ref 0–100)
TCO2: 28 mmol/L (ref 0–100)
TCO2: 26 mmol/L (ref 0–100)
TCO2: 26 mmol/L (ref 0–100)
TCO2: 27 mmol/L (ref 0–100)
pCO2 arterial: 44.2 mmHg (ref 32.0–48.0)
pCO2 arterial: 54.1 mmHg — ABNORMAL HIGH (ref 32.0–48.0)
pH, Arterial: 7.478 — ABNORMAL HIGH (ref 7.350–7.450)
pH, Arterial: 7.278 — ABNORMAL LOW (ref 7.350–7.450)
pO2, Arterial: 469 mmHg — ABNORMAL HIGH (ref 83.0–108.0)
pO2, Arterial: 73 mmHg — ABNORMAL LOW (ref 83.0–108.0)
pO2, Arterial: 91 mmHg (ref 83.0–108.0)

## 2016-07-05 LAB — POCT I-STAT, CHEM 8
BUN: 14 mg/dL (ref 6–20)
BUN: 12 mg/dL (ref 6–20)
BUN: 12 mg/dL (ref 6–20)
BUN: 12 mg/dL (ref 6–20)
BUN: 12 mg/dL (ref 6–20)
BUN: 11 mg/dL (ref 6–20)
BUN: 11 mg/dL (ref 6–20)
CALCIUM ION: 1.2 mmol/L (ref 1.15–1.40)
CALCIUM ION: 1.17 mmol/L (ref 1.15–1.40)
CALCIUM ION: 0.97 mmol/L — AB (ref 1.15–1.40)
CALCIUM ION: 1.04 mmol/L — AB (ref 1.15–1.40)
CHLORIDE: 102 mmol/L (ref 101–111)
CHLORIDE: 101 mmol/L (ref 101–111)
CHLORIDE: 103 mmol/L (ref 101–111)
CHLORIDE: 102 mmol/L (ref 101–111)
CREATININE: 0.5 mg/dL (ref 0.44–1.00)
Calcium, Ion: 1.05 mmol/L — ABNORMAL LOW (ref 1.15–1.40)
Calcium, Ion: 1.04 mmol/L — ABNORMAL LOW (ref 1.15–1.40)
Calcium, Ion: 1.11 mmol/L — ABNORMAL LOW (ref 1.15–1.40)
Chloride: 102 mmol/L (ref 101–111)
Chloride: 99 mmol/L — ABNORMAL LOW (ref 101–111)
Chloride: 104 mmol/L (ref 101–111)
Creatinine, Ser: 0.6 mg/dL (ref 0.44–1.00)
Creatinine, Ser: 0.4 mg/dL — ABNORMAL LOW (ref 0.44–1.00)
Creatinine, Ser: 0.5 mg/dL (ref 0.44–1.00)
Creatinine, Ser: 0.5 mg/dL (ref 0.44–1.00)
Creatinine, Ser: 0.6 mg/dL (ref 0.44–1.00)
Creatinine, Ser: 0.6 mg/dL (ref 0.44–1.00)
GLUCOSE: 111 mg/dL — AB (ref 65–99)
GLUCOSE: 120 mg/dL — AB (ref 65–99)
Glucose, Bld: 110 mg/dL — ABNORMAL HIGH (ref 65–99)
Glucose, Bld: 137 mg/dL — ABNORMAL HIGH (ref 65–99)
Glucose, Bld: 138 mg/dL — ABNORMAL HIGH (ref 65–99)
Glucose, Bld: 144 mg/dL — ABNORMAL HIGH (ref 65–99)
Glucose, Bld: 164 mg/dL — ABNORMAL HIGH (ref 65–99)
HCT: 30 % — ABNORMAL LOW (ref 36.0–46.0)
HCT: 31 % — ABNORMAL LOW (ref 36.0–46.0)
HCT: 24 % — ABNORMAL LOW (ref 36.0–46.0)
HEMATOCRIT: 23 % — AB (ref 36.0–46.0)
HEMATOCRIT: 21 % — AB (ref 36.0–46.0)
HEMATOCRIT: 27 % — AB (ref 36.0–46.0)
HEMATOCRIT: 32 % — AB (ref 36.0–46.0)
HEMOGLOBIN: 10.2 g/dL — AB (ref 12.0–15.0)
HEMOGLOBIN: 10.5 g/dL — AB (ref 12.0–15.0)
HEMOGLOBIN: 8.2 g/dL — AB (ref 12.0–15.0)
HEMOGLOBIN: 7.8 g/dL — AB (ref 12.0–15.0)
HEMOGLOBIN: 7.1 g/dL — AB (ref 12.0–15.0)
Hemoglobin: 9.2 g/dL — ABNORMAL LOW (ref 12.0–15.0)
Hemoglobin: 10.9 g/dL — ABNORMAL LOW (ref 12.0–15.0)
POTASSIUM: 4.1 mmol/L (ref 3.5–5.1)
POTASSIUM: 3.9 mmol/L (ref 3.5–5.1)
Potassium: 3.9 mmol/L (ref 3.5–5.1)
Potassium: 3.6 mmol/L (ref 3.5–5.1)
Potassium: 3.3 mmol/L — ABNORMAL LOW (ref 3.5–5.1)
Potassium: 3.7 mmol/L (ref 3.5–5.1)
Potassium: 3.7 mmol/L (ref 3.5–5.1)
SODIUM: 142 mmol/L (ref 135–145)
SODIUM: 138 mmol/L (ref 135–145)
SODIUM: 140 mmol/L (ref 135–145)
SODIUM: 141 mmol/L (ref 135–145)
SODIUM: 142 mmol/L (ref 135–145)
SODIUM: 140 mmol/L (ref 135–145)
Sodium: 141 mmol/L (ref 135–145)
TCO2: 32 mmol/L (ref 0–100)
TCO2: 29 mmol/L (ref 0–100)
TCO2: 30 mmol/L (ref 0–100)
TCO2: 33 mmol/L (ref 0–100)
TCO2: 31 mmol/L (ref 0–100)
TCO2: 27 mmol/L (ref 0–100)
TCO2: 25 mmol/L (ref 0–100)

## 2016-07-05 LAB — POCT I-STAT 4, (NA,K, GLUC, HGB,HCT)
Glucose, Bld: 95 mg/dL (ref 65–99)
HCT: 29 % — ABNORMAL LOW (ref 36.0–46.0)
Hemoglobin: 9.9 g/dL — ABNORMAL LOW (ref 12.0–15.0)
Potassium: 4.6 mmol/L (ref 3.5–5.1)
Sodium: 142 mmol/L (ref 135–145)

## 2016-07-05 LAB — PROTIME-INR
INR: 1.4
PROTHROMBIN TIME: 17.3 s — AB (ref 11.4–15.2)

## 2016-07-05 LAB — PREPARE RBC (CROSSMATCH)

## 2016-07-05 LAB — APTT: aPTT: 35 seconds (ref 24–36)

## 2016-07-05 LAB — GLUCOSE, CAPILLARY
GLUCOSE-CAPILLARY: 101 mg/dL — AB (ref 65–99)
Glucose-Capillary: 95 mg/dL (ref 65–99)
Glucose-Capillary: 139 mg/dL — ABNORMAL HIGH (ref 65–99)
Glucose-Capillary: 143 mg/dL — ABNORMAL HIGH (ref 65–99)

## 2016-07-05 LAB — CREATININE, SERUM
CREATININE: 0.81 mg/dL (ref 0.44–1.00)
GFR calc Af Amer: >60 mL/min (ref >60)

## 2016-07-05 LAB — MAGNESIUM: Magnesium: 2.9 mg/dL — ABNORMAL HIGH (ref 1.7–2.4)

## 2016-07-05 LAB — ECHO TEE: TV annulus diam: 3.5 cm

## 2016-07-05 SURGERY — REPAIR, MITRAL VALVE, MINIMALLY INVASIVE
Anesthesia: General | Site: Chest | Laterality: Right

## 2016-07-05 MED ORDER — BUPIVACAINE HCL (PF) 0.5 % IJ SOLN
INTRAMUSCULAR | Status: AC
  Filled 2016-07-05: qty 10

## 2016-07-05 MED ORDER — SODIUM CHLORIDE 0.9 % IV SOLN
30.0000 meq | Freq: Once | INTRAVENOUS | Status: AC
  Administered 2016-07-05: 30 meq via INTRAVENOUS
  Filled 2016-07-05: qty 15

## 2016-07-05 MED ORDER — MIDAZOLAM HCL 2 MG/2ML IJ SOLN: 2.0000 mg | INTRAMUSCULAR | Status: DC | PRN

## 2016-07-05 MED ORDER — MIDAZOLAM HCL 10 MG/2ML IJ SOLN
INTRAMUSCULAR | Status: AC
Start: 2016-07-05 — End: 2016-07-05
  Filled 2016-07-05: qty 2

## 2016-07-05 MED ORDER — HYDROCORTISONE NA SUCCINATE PF 100 MG IJ SOLR
100.0000 mg | Freq: Once | INTRAMUSCULAR | Status: AC
  Administered 2016-07-05: 100 mg via INTRAVENOUS
  Filled 2016-07-05: qty 2

## 2016-07-05 MED ORDER — LACTATED RINGERS IV SOLN
INTRAVENOUS | Status: DC | PRN
  Administered 2016-07-05: 07:00:00 via INTRAVENOUS

## 2016-07-05 MED ORDER — BUPIVACAINE HCL (PF) 0.5 % IJ SOLN: INTRAMUSCULAR | Status: DC | PRN

## 2016-07-05 MED ORDER — ROCURONIUM BROMIDE 50 MG/5ML IV SOSY
PREFILLED_SYRINGE | INTRAVENOUS | Status: AC
  Filled 2016-07-05: qty 15

## 2016-07-05 MED ORDER — ACETAMINOPHEN 650 MG RE SUPP
650.0000 mg | Freq: Once | RECTAL | Status: AC
  Administered 2016-07-05: 650 mg via RECTAL

## 2016-07-05 MED ORDER — LACTATED RINGERS IV SOLN: INTRAVENOUS | Status: DC

## 2016-07-05 MED ORDER — FUROSEMIDE 10 MG/ML IJ SOLN
40.0000 mg | Freq: Once | INTRAMUSCULAR | Status: AC
  Administered 2016-07-05: 40 mg via INTRAVENOUS

## 2016-07-05 MED ORDER — INSULIN ASPART 100 UNIT/ML ~~LOC~~ SOLN
0.0000 [IU] | SUBCUTANEOUS | Status: DC
  Administered 2016-07-05 – 2016-07-07 (×10): 2 [IU] via SUBCUTANEOUS

## 2016-07-05 MED ORDER — MORPHINE SULFATE (PF) 2 MG/ML IV SOLN
1.0000 mg | INTRAVENOUS | Status: DC | PRN
  Administered 2016-07-07: 2 mg via INTRAVENOUS
  Filled 2016-07-05: qty 1

## 2016-07-05 MED ORDER — BISACODYL 10 MG RE SUPP: 10.0000 mg | Freq: Every day | RECTAL | Status: DC

## 2016-07-05 MED ORDER — ALBUMIN HUMAN 5 % IV SOLN
12.5000 g | Freq: Once | INTRAVENOUS | Status: AC
  Administered 2016-07-05: 12.5 g via INTRAVENOUS

## 2016-07-05 MED ORDER — FENTANYL CITRATE (PF) 250 MCG/5ML IJ SOLN
INTRAMUSCULAR | Status: AC
  Filled 2016-07-05: qty 5

## 2016-07-05 MED ORDER — ASPIRIN 81 MG PO CHEW: 324.0000 mg | CHEWABLE_TABLET | Freq: Every day | ORAL | Status: DC

## 2016-07-05 MED ORDER — PANTOPRAZOLE SODIUM 40 MG PO TBEC
40.0000 mg | DELAYED_RELEASE_TABLET | Freq: Every day | ORAL | Status: DC
  Administered 2016-07-07 – 2016-07-14 (×8): 40 mg via ORAL
  Filled 2016-07-05 (×8): qty 1

## 2016-07-05 MED ORDER — METOPROLOL TARTRATE 5 MG/5ML IV SOLN: 2.5000 mg | INTRAVENOUS | Status: DC | PRN

## 2016-07-05 MED ORDER — METOPROLOL TARTRATE 25 MG/10 ML ORAL SUSPENSION: 12.5000 mg | Freq: Two times a day (BID) | ORAL | Status: DC

## 2016-07-05 MED ORDER — BUPIVACAINE 0.5 % ON-Q PUMP SINGLE CATH 400 ML
400.0000 mL | INJECTION | Status: DC
  Filled 2016-07-05: qty 400

## 2016-07-05 MED ORDER — ROCURONIUM BROMIDE 10 MG/ML (PF) SYRINGE
PREFILLED_SYRINGE | INTRAVENOUS | Status: DC | PRN
  Administered 2016-07-05 (×2): 50 mg via INTRAVENOUS
  Administered 2016-07-05: 30 mg via INTRAVENOUS
  Administered 2016-07-05: 50 mg via INTRAVENOUS

## 2016-07-05 MED ORDER — ORAL CARE MOUTH RINSE
15.0000 mL | Freq: Four times a day (QID) | OROMUCOSAL | Status: DC
  Administered 2016-07-06 – 2016-07-08 (×8): 15 mL via OROMUCOSAL

## 2016-07-05 MED ORDER — SODIUM CHLORIDE 0.9 % IV SOLN
0.0000 ug/min | INTRAVENOUS | Status: DC
  Administered 2016-07-06: 20 ug/min via INTRAVENOUS
  Filled 2016-07-05 (×3): qty 2

## 2016-07-05 MED ORDER — 0.9 % SODIUM CHLORIDE (POUR BTL) OPTIME
TOPICAL | Status: DC | PRN
  Administered 2016-07-05: 5000 mL

## 2016-07-05 MED ORDER — MIDAZOLAM HCL 5 MG/5ML IJ SOLN
INTRAMUSCULAR | Status: DC | PRN
  Administered 2016-07-05: 1 mg via INTRAVENOUS
  Administered 2016-07-05: 2 mg via INTRAVENOUS
  Administered 2016-07-05: 1 mg via INTRAVENOUS
  Administered 2016-07-05: 2 mg via INTRAVENOUS

## 2016-07-05 MED ORDER — ACETAMINOPHEN 160 MG/5ML PO SOLN: 650.0000 mg | Freq: Once | ORAL | Status: AC

## 2016-07-05 MED ORDER — PROPOFOL 10 MG/ML IV BOLUS
INTRAVENOUS | Status: AC
  Filled 2016-07-05: qty 20

## 2016-07-05 MED ORDER — SODIUM CHLORIDE 0.9 % IV SOLN: 250.0000 mL | INTRAVENOUS | Status: DC

## 2016-07-05 MED ORDER — LIDOCAINE 2% (20 MG/ML) 5 ML SYRINGE
INTRAMUSCULAR | Status: AC
  Filled 2016-07-05: qty 5

## 2016-07-05 MED ORDER — SODIUM CHLORIDE 0.9% FLUSH: 3.0000 mL | INTRAVENOUS | Status: DC | PRN

## 2016-07-05 MED ORDER — FENTANYL CITRATE (PF) 250 MCG/5ML IJ SOLN
INTRAMUSCULAR | Status: AC
  Filled 2016-07-05: qty 25

## 2016-07-05 MED ORDER — SODIUM CHLORIDE 0.9 % IR SOLN
Status: DC | PRN
  Administered 2016-07-05: 3000 mL

## 2016-07-05 MED ORDER — HEPARIN SODIUM (PORCINE) 1000 UNIT/ML IJ SOLN
INTRAMUSCULAR | Status: AC
  Filled 2016-07-05: qty 1

## 2016-07-05 MED ORDER — NITROGLYCERIN IN D5W 200-5 MCG/ML-% IV SOLN: 0.0000 ug/min | INTRAVENOUS | Status: DC

## 2016-07-05 MED ORDER — PROTAMINE SULFATE 10 MG/ML IV SOLN
INTRAVENOUS | Status: DC | PRN
  Administered 2016-07-05: 240 mg via INTRAVENOUS
  Administered 2016-07-05: 10 mg via INTRAVENOUS

## 2016-07-05 MED ORDER — ALBUMIN HUMAN 25 % IV SOLN: 12.5000 g | Freq: Once | INTRAVENOUS | Status: DC

## 2016-07-05 MED ORDER — BISACODYL 5 MG PO TBEC
10.0000 mg | DELAYED_RELEASE_TABLET | Freq: Every day | ORAL | Status: DC
  Administered 2016-07-06 – 2016-07-14 (×9): 10 mg via ORAL
  Filled 2016-07-05 (×9): qty 2

## 2016-07-05 MED ORDER — ALBUMIN HUMAN 5 % IV SOLN
INTRAVENOUS | Status: DC | PRN
  Administered 2016-07-05: 13:00:00 via INTRAVENOUS

## 2016-07-05 MED ORDER — CHLORHEXIDINE GLUCONATE 4 % EX LIQD: 30.0000 mL | CUTANEOUS | Status: DC

## 2016-07-05 MED ORDER — HEPARIN SODIUM (PORCINE) 1000 UNIT/ML IJ SOLN
INTRAMUSCULAR | Status: DC | PRN
  Administered 2016-07-05: 25000 [IU] via INTRAVENOUS

## 2016-07-05 MED ORDER — ACETAMINOPHEN 500 MG PO TABS
1000.0000 mg | ORAL_TABLET | Freq: Four times a day (QID) | ORAL | Status: AC
Start: 2016-07-06 — End: 2016-07-10
  Administered 2016-07-06 – 2016-07-10 (×16): 1000 mg via ORAL
  Filled 2016-07-05 (×15): qty 2

## 2016-07-05 MED ORDER — MORPHINE SULFATE (PF) 2 MG/ML IV SOLN: 1.0000 mg | INTRAVENOUS | Status: DC | PRN

## 2016-07-05 MED ORDER — SODIUM CHLORIDE 0.45 % IV SOLN
INTRAVENOUS | Status: DC | PRN
  Administered 2016-07-05: 14:00:00 via INTRAVENOUS

## 2016-07-05 MED ORDER — PROPOFOL 10 MG/ML IV BOLUS
INTRAVENOUS | Status: DC | PRN
  Administered 2016-07-05: 40 mg via INTRAVENOUS

## 2016-07-05 MED ORDER — DEXTROSE 5 % IV SOLN
1.5000 g | Freq: Two times a day (BID) | INTRAVENOUS | Status: AC
  Administered 2016-07-05 – 2016-07-07 (×4): 1.5 g via INTRAVENOUS
  Filled 2016-07-05 (×4): qty 1.5

## 2016-07-05 MED ORDER — DEXMEDETOMIDINE HCL IN NACL 200 MCG/50ML IV SOLN
0.0000 ug/kg/h | INTRAVENOUS | Status: DC
  Filled 2016-07-05: qty 50

## 2016-07-05 MED ORDER — PHENYLEPHRINE 40 MCG/ML (10ML) SYRINGE FOR IV PUSH (FOR BLOOD PRESSURE SUPPORT)
PREFILLED_SYRINGE | INTRAVENOUS | Status: AC
  Filled 2016-07-05: qty 10

## 2016-07-05 MED ORDER — FENTANYL CITRATE (PF) 250 MCG/5ML IJ SOLN
INTRAMUSCULAR | Status: DC | PRN
  Administered 2016-07-05: 100 ug via INTRAVENOUS
  Administered 2016-07-05: 50 ug via INTRAVENOUS
  Administered 2016-07-05: 150 ug via INTRAVENOUS
  Administered 2016-07-05: 100 ug via INTRAVENOUS
  Administered 2016-07-05 (×2): 50 ug via INTRAVENOUS
  Administered 2016-07-05: 100 ug via INTRAVENOUS
  Administered 2016-07-05 (×4): 50 ug via INTRAVENOUS
  Administered 2016-07-05: 950 ug via INTRAVENOUS

## 2016-07-05 MED ORDER — FAMOTIDINE IN NACL 20-0.9 MG/50ML-% IV SOLN
20.0000 mg | Freq: Two times a day (BID) | INTRAVENOUS | Status: AC
  Administered 2016-07-05 (×2): 20 mg via INTRAVENOUS
  Filled 2016-07-05: qty 50

## 2016-07-05 MED ORDER — LACTATED RINGERS IV SOLN
INTRAVENOUS | Status: DC
  Administered 2016-07-05 (×2): via INTRAVENOUS

## 2016-07-05 MED ORDER — SODIUM CHLORIDE 0.9 % IV SOLN
30.0000 meq | Freq: Once | INTRAVENOUS | Status: DC
  Filled 2016-07-05: qty 15

## 2016-07-05 MED ORDER — INSULIN REGULAR HUMAN 100 UNIT/ML IJ SOLN
INTRAMUSCULAR | Status: DC
  Filled 2016-07-05: qty 2.5

## 2016-07-05 MED ORDER — LACTATED RINGERS IV SOLN
INTRAVENOUS | Status: DC | PRN
  Administered 2016-07-05 (×2): via INTRAVENOUS

## 2016-07-05 MED ORDER — ONDANSETRON HCL 4 MG/2ML IJ SOLN
4.0000 mg | Freq: Four times a day (QID) | INTRAMUSCULAR | Status: DC | PRN
  Administered 2016-07-06: 4 mg via INTRAVENOUS
  Filled 2016-07-05: qty 2

## 2016-07-05 MED ORDER — CHLORHEXIDINE GLUCONATE 0.12 % MT SOLN
15.0000 mL | OROMUCOSAL | Status: AC
  Administered 2016-07-05: 15 mL via OROMUCOSAL

## 2016-07-05 MED ORDER — SODIUM CHLORIDE 0.9% FLUSH
3.0000 mL | Freq: Two times a day (BID) | INTRAVENOUS | Status: DC
  Administered 2016-07-06 – 2016-07-07 (×3): 3 mL via INTRAVENOUS

## 2016-07-05 MED ORDER — OXYCODONE HCL 5 MG PO TABS
5.0000 mg | ORAL_TABLET | ORAL | Status: DC | PRN
  Administered 2016-07-07 (×2): 10 mg via ORAL
  Filled 2016-07-05 (×3): qty 2

## 2016-07-05 MED ORDER — DOCUSATE SODIUM 100 MG PO CAPS
200.0000 mg | ORAL_CAPSULE | Freq: Every day | ORAL | Status: DC
  Administered 2016-07-06 – 2016-07-07 (×2): 200 mg via ORAL
  Filled 2016-07-05 (×2): qty 2

## 2016-07-05 MED ORDER — PROTAMINE SULFATE 10 MG/ML IV SOLN
INTRAVENOUS | Status: AC
  Filled 2016-07-05: qty 50

## 2016-07-05 MED ORDER — ASPIRIN EC 325 MG PO TBEC
325.0000 mg | DELAYED_RELEASE_TABLET | Freq: Every day | ORAL | Status: DC
  Administered 2016-07-06 – 2016-07-07 (×2): 325 mg via ORAL
  Filled 2016-07-05 (×2): qty 1

## 2016-07-05 MED ORDER — TRAMADOL HCL 50 MG PO TABS
50.0000 mg | ORAL_TABLET | ORAL | Status: DC | PRN
  Administered 2016-07-06: 50 mg via ORAL
  Filled 2016-07-05: qty 1

## 2016-07-05 MED ORDER — SODIUM CHLORIDE 0.9 % IV SOLN
INTRAVENOUS | Status: DC
  Administered 2016-07-05: 14:00:00 via INTRAVENOUS

## 2016-07-05 MED ORDER — CHLORHEXIDINE GLUCONATE 0.12% ORAL RINSE (MEDLINE KIT)
15.0000 mL | Freq: Two times a day (BID) | OROMUCOSAL | Status: DC
  Administered 2016-07-05 – 2016-07-14 (×9): 15 mL via OROMUCOSAL

## 2016-07-05 MED ORDER — VANCOMYCIN HCL IN DEXTROSE 1-5 GM/200ML-% IV SOLN
1000.0000 mg | Freq: Once | INTRAVENOUS | Status: AC
  Administered 2016-07-05: 1000 mg via INTRAVENOUS
  Filled 2016-07-05: qty 200

## 2016-07-05 MED ORDER — ALBUMIN HUMAN 5 % IV SOLN
250.0000 mL | INTRAVENOUS | Status: AC | PRN
  Administered 2016-07-05 – 2016-07-06 (×3): 250 mL via INTRAVENOUS
  Filled 2016-07-05: qty 250

## 2016-07-05 MED ORDER — ACETAMINOPHEN 160 MG/5ML PO SOLN
1000.0000 mg | Freq: Four times a day (QID) | ORAL | Status: DC
  Administered 2016-07-05 – 2016-07-06 (×2): 1000 mg
  Filled 2016-07-05 (×2): qty 40.6

## 2016-07-05 MED ORDER — LACTATED RINGERS IV SOLN
INTRAVENOUS | Status: DC | PRN
  Administered 2016-07-05 (×2): via INTRAVENOUS

## 2016-07-05 MED ORDER — LACTATED RINGERS IV SOLN: 500.0000 mL | Freq: Once | INTRAVENOUS | Status: DC | PRN

## 2016-07-05 MED ORDER — MAGNESIUM SULFATE 4 GM/100ML IV SOLN
4.0000 g | Freq: Once | INTRAVENOUS | Status: AC
  Administered 2016-07-05: 4 g via INTRAVENOUS
  Filled 2016-07-05: qty 100

## 2016-07-05 MED ORDER — METOPROLOL TARTRATE 12.5 MG HALF TABLET: 12.5000 mg | ORAL_TABLET | Freq: Two times a day (BID) | ORAL | Status: DC

## 2016-07-05 MED ORDER — INSULIN REGULAR BOLUS VIA INFUSION
0.0000 [IU] | Freq: Three times a day (TID) | INTRAVENOUS | Status: DC
  Filled 2016-07-05: qty 10

## 2016-07-05 MED FILL — Heparin Sodium (Porcine) Inj 1000 Unit/ML: INTRAMUSCULAR | Qty: 30 | Status: AC

## 2016-07-05 MED FILL — Magnesium Sulfate Inj 50%: INTRAMUSCULAR | Qty: 10 | Status: AC

## 2016-07-05 MED FILL — Potassium Chloride Inj 2 mEq/ML: INTRAVENOUS | Qty: 40 | Status: AC

## 2016-07-05 SURGICAL SUPPLY — 136 items
ADAPTER CARDIO PERF ANTE/RETRO (ADAPTER) ×8 IMPLANT
ADAPTER DLP PERFUSION .25INX2I (MISCELLANEOUS) ×4 IMPLANT
ARTICLIP LAA PROCLIP II 45 (Clip) ×4 IMPLANT
ATTRACTOMAT 16X20 MAGNETIC DRP (DRAPES) ×4 IMPLANT
BAG DECANTER FOR FLEXI CONT (MISCELLANEOUS) ×4 IMPLANT
BLADE SURG 11 STRL SS (BLADE) ×4 IMPLANT
CANISTER SUCT 3000ML PPV (MISCELLANEOUS) ×4 IMPLANT
CANNULA EZ GLIDE 8.0 24FR (CANNULA) ×4 IMPLANT
CANNULA FEM VENOUS REMOTE 22FR (CANNULA) ×4 IMPLANT
CANNULA FEMORAL ART 14 SM (MISCELLANEOUS) ×4 IMPLANT
CANNULA GUNDRY RCSP 15FR (MISCELLANEOUS) ×4 IMPLANT
CANNULA OPTISITE PERFUSION 16F (CANNULA) ×4 IMPLANT
CANNULA OPTISITE PERFUSION 18F (CANNULA) IMPLANT
CANNULA SUMP PERICARDIAL (CANNULA) ×8 IMPLANT
CARDIOBLATE CARDIAC ABLATION (MISCELLANEOUS)
CATH KIT ON Q 5IN SLV (PAIN MANAGEMENT) IMPLANT
CATH KIT ON-Q SILVERSOAK 5IN (CATHETERS) ×4 IMPLANT
CATH ROBINSON RED A/P 18FR (CATHETERS) ×4 IMPLANT
CELLS DAT CNTRL 66122 CELL SVR (MISCELLANEOUS) ×3 IMPLANT
CLAMP OLL ABLATION (MISCELLANEOUS) ×4 IMPLANT
CONN ST 1/4X3/8  BEN (MISCELLANEOUS) ×2
CONN ST 1/4X3/8 BEN (MISCELLANEOUS) ×6 IMPLANT
CONNECTOR 1/2X3/8X1/2 3 WAY (MISCELLANEOUS) ×2
CONNECTOR 1/2X3/8X1/2 3WAY (MISCELLANEOUS) ×6 IMPLANT
CONT SPEC 4OZ CLIKSEAL STRL BL (MISCELLANEOUS) ×8 IMPLANT
CONT SPEC STER OR (MISCELLANEOUS) ×4 IMPLANT
COVER BACK TABLE 24X17X13 BIG (DRAPES) ×4 IMPLANT
COVER MAYO STAND STRL (DRAPES) ×4 IMPLANT
COVER PROBE W GEL 5X96 (DRAPES) ×4 IMPLANT
CRADLE DONUT ADULT HEAD (MISCELLANEOUS) ×4 IMPLANT
DERMABOND ADVANCED (GAUZE/BANDAGES/DRESSINGS) ×2
DERMABOND ADVANCED .7 DNX12 (GAUZE/BANDAGES/DRESSINGS) ×6 IMPLANT
DEVICE ATRICLIP LAA PRCLPII 45 (Clip) ×3 IMPLANT
DEVICE CARDIOBLATE CARDIAC ABL (MISCELLANEOUS) IMPLANT
DEVICE PMI PUNCTURE CLOSURE (MISCELLANEOUS) ×4 IMPLANT
DEVICE SUT CK QUICK LOAD MINI (Prosthesis & Implant Heart) ×8 IMPLANT
DEVICE TROCAR PUNCTURE CLOSURE (ENDOMECHANICALS) ×8 IMPLANT
DRAIN CHANNEL 28F RND 3/8 FF (WOUND CARE) ×8 IMPLANT
DRAPE BILATERAL SPLIT (DRAPES) ×4 IMPLANT
DRAPE C-ARM 42X72 X-RAY (DRAPES) ×8 IMPLANT
DRAPE CV SPLIT W-CLR ANES SCRN (DRAPES) ×4 IMPLANT
DRAPE INCISE IOBAN 66X45 STRL (DRAPES) ×20 IMPLANT
DRAPE SLUSH/WARMER DISC (DRAPES) ×4 IMPLANT
DRSG COVADERM 4X8 (GAUZE/BANDAGES/DRESSINGS) ×4 IMPLANT
ELECT BLADE 6.5 EXT (BLADE) ×8 IMPLANT
ELECT REM PT RETURN 9FT ADLT (ELECTROSURGICAL) ×8
ELECTRODE REM PT RTRN 9FT ADLT (ELECTROSURGICAL) ×6 IMPLANT
FELT TEFLON 1X6 (MISCELLANEOUS) ×8 IMPLANT
FEMORAL VENOUS CANN RAP (CANNULA) IMPLANT
GAUZE SPONGE 4X4 12PLY STRL (GAUZE/BANDAGES/DRESSINGS) ×4 IMPLANT
GLOVE BIO SURGEON STRL SZ 6.5 (GLOVE) ×4 IMPLANT
GLOVE BIOGEL M 6.5 STRL (GLOVE) ×16 IMPLANT
GLOVE BIOGEL PI IND STRL 6.5 (GLOVE) ×24 IMPLANT
GLOVE BIOGEL PI INDICATOR 6.5 (GLOVE) ×8
GLOVE ORTHO TXT STRL SZ7.5 (GLOVE) ×24 IMPLANT
GOWN STRL REUS W/ TWL LRG LVL3 (GOWN DISPOSABLE) ×36 IMPLANT
GOWN STRL REUS W/TWL LRG LVL3 (GOWN DISPOSABLE) ×12
GRASPER SUT TROCAR 14GX15 (MISCELLANEOUS) ×4 IMPLANT
GUIDEWIRE ORTHO .078X20 SS ATK (WIRE) ×4 IMPLANT
KIT BASIN OR (CUSTOM PROCEDURE TRAY) ×4 IMPLANT
KIT DILATOR VASC 18G NDL (KITS) ×4 IMPLANT
KIT DRAINAGE VACCUM ASSIST (KITS) ×4 IMPLANT
KIT ROOM TURNOVER OR (KITS) ×4 IMPLANT
KIT SUCTION CATH 14FR (SUCTIONS) ×12 IMPLANT
KIT SUT CK MINI COMBO 4X17 (Prosthesis & Implant Heart) ×4 IMPLANT
LEAD PACING MYOCARDI (MISCELLANEOUS) ×4 IMPLANT
LINE VENT (MISCELLANEOUS) ×4 IMPLANT
LOOP VESSEL SUPERMAXI WHITE (MISCELLANEOUS) ×4 IMPLANT
NEEDLE AORTIC ROOT 14G 7F (CATHETERS) ×4 IMPLANT
NS IRRIG 1000ML POUR BTL (IV SOLUTION) ×20 IMPLANT
PACK OPEN HEART (CUSTOM PROCEDURE TRAY) ×4 IMPLANT
PAD ARMBOARD 7.5X6 YLW CONV (MISCELLANEOUS) ×16 IMPLANT
PAD ELECT DEFIB RADIOL ZOLL (MISCELLANEOUS) ×4 IMPLANT
PROBE CRYO2-ABLATION MALLABLE (MISCELLANEOUS) ×4 IMPLANT
RING MITRAL MEMO 3D 30MM SMD30 (Prosthesis & Implant Heart) ×4 IMPLANT
RTRCTR WOUND ALEXIS 18CM MED (MISCELLANEOUS) ×4
SET CANNULATION TOURNIQUET (MISCELLANEOUS) ×4 IMPLANT
SET CARDIOPLEGIA MPS 5001102 (MISCELLANEOUS) ×4 IMPLANT
SET IRRIG TUBING LAPAROSCOPIC (IRRIGATION / IRRIGATOR) ×4 IMPLANT
SOLUTION ANTI FOG 6CC (MISCELLANEOUS) ×4 IMPLANT
SPONGE GAUZE 4X4 12PLY STER LF (GAUZE/BANDAGES/DRESSINGS) ×4 IMPLANT
SUT BONE WAX W31G (SUTURE) ×8 IMPLANT
SUT E-PACK MINIMALLY INVASIVE (SUTURE) ×4 IMPLANT
SUT ETHIBOND (SUTURE) ×8 IMPLANT
SUT ETHIBOND 2 0 SH (SUTURE) ×4 IMPLANT
SUT ETHIBOND 2 0 V4 (SUTURE) IMPLANT
SUT ETHIBOND 2 0V4 GREEN (SUTURE) IMPLANT
SUT ETHIBOND 2-0 RB-1 WHT (SUTURE) ×8 IMPLANT
SUT ETHIBOND 4 0 TF (SUTURE) IMPLANT
SUT ETHIBOND 5 0 C 1 30 (SUTURE) IMPLANT
SUT ETHIBOND NAB MH 2-0 36IN (SUTURE) ×4 IMPLANT
SUT ETHIBOND X763 2 0 SH 1 (SUTURE) ×8 IMPLANT
SUT GORETEX 6.0 TH-9 30 IN (SUTURE) IMPLANT
SUT GORETEX CV 4 TH 22 36 (SUTURE) ×8 IMPLANT
SUT GORETEX CV-5THC-13 36IN (SUTURE) ×8 IMPLANT
SUT GORETEX CV4 TH-18 (SUTURE) ×16 IMPLANT
SUT GORETEX TH-18 36 INCH (SUTURE) IMPLANT
SUT MNCRL AB 3-0 PS2 18 (SUTURE) ×8 IMPLANT
SUT PROLENE 3 0 SH DA (SUTURE) ×16 IMPLANT
SUT PROLENE 3 0 SH1 36 (SUTURE) ×52 IMPLANT
SUT PROLENE 4 0 RB 1 (SUTURE) ×3
SUT PROLENE 4-0 RB1 .5 CRCL 36 (SUTURE) ×9 IMPLANT
SUT PROLENE 5 0 C 1 36 (SUTURE) ×8 IMPLANT
SUT PROLENE 6 0 C 1 30 (SUTURE) ×8 IMPLANT
SUT PTFE CHORD X 20MM (SUTURE) ×4 IMPLANT
SUT SILK  1 MH (SUTURE) ×7
SUT SILK 1 MH (SUTURE) ×21 IMPLANT
SUT SILK 1 TIES 10X30 (SUTURE) ×4 IMPLANT
SUT SILK 2 0 SH CR/8 (SUTURE) ×4 IMPLANT
SUT SILK 2 0 TIES 10X30 (SUTURE) ×4 IMPLANT
SUT SILK 2 0SH CR/8 30 (SUTURE) ×8 IMPLANT
SUT SILK 3 0 (SUTURE) ×1
SUT SILK 3 0 SH CR/8 (SUTURE) ×4 IMPLANT
SUT SILK 3 0SH CR/8 30 (SUTURE) ×4 IMPLANT
SUT SILK 3-0 18XBRD TIE 12 (SUTURE) ×3 IMPLANT
SUT TEM PAC WIRE 2 0 SH (SUTURE) ×8 IMPLANT
SUT VIC AB 2-0 CTX 36 (SUTURE) ×8 IMPLANT
SUT VIC AB 2-0 UR6 27 (SUTURE) ×8 IMPLANT
SUT VIC AB 3-0 SH 8-18 (SUTURE) ×20 IMPLANT
SUT VICRYL 2 TP 1 (SUTURE) ×4 IMPLANT
SYR 10ML LL (SYRINGE) ×8 IMPLANT
SYSTEM SAHARA CHEST DRAIN ATS (WOUND CARE) ×4 IMPLANT
TAPE CLOTH SURG 4X10 WHT LF (GAUZE/BANDAGES/DRESSINGS) ×4 IMPLANT
TAPE PAPER 2X10 WHT MICROPORE (GAUZE/BANDAGES/DRESSINGS) ×4 IMPLANT
TOWEL GREEN STERILE (TOWEL DISPOSABLE) ×4 IMPLANT
TOWEL GREEN STERILE FF (TOWEL DISPOSABLE) IMPLANT
TOWEL OR 17X24 6PK STRL BLUE (TOWEL DISPOSABLE) ×4 IMPLANT
TOWEL OR 17X26 10 PK STRL BLUE (TOWEL DISPOSABLE) ×4 IMPLANT
TRAY FOLEY IC TEMP SENS 16FR (CATHETERS) ×4 IMPLANT
TROCAR XCEL BLADELESS 5X75MML (TROCAR) ×8 IMPLANT
TROCAR XCEL NON-BLD 11X100MML (ENDOMECHANICALS) ×4 IMPLANT
TUBE SUCT INTRACARD DLP 20F (MISCELLANEOUS) ×4 IMPLANT
TUNNELER SHEATH ON-Q 11GX8 DSP (PAIN MANAGEMENT) ×4 IMPLANT
UNDERPAD 30X30 (UNDERPADS AND DIAPERS) ×4 IMPLANT
WATER STERILE IRR 1000ML POUR (IV SOLUTION) ×8 IMPLANT
WIRE .035 3MM-J 145CM (WIRE) ×4 IMPLANT

## 2016-07-05 NOTE — Progress Notes (Signed)
Dr. Roxy Manns made aware pt's UOP 20-30cc/hr since she arrived to the unit at 1400. CI 1.8, MAP <70 trending down requiring additional Albumin and Neo titration to 44mcg, Verbal orders to given for 61mc Lasix and additional albumin for a total of 5. Will continue to monitor pt closely.

## 2016-07-05 NOTE — Progress Notes (Signed)
s/p ABG, and left chest tube placement, set vent rate increased to 20 per discussion w/ Dr Roxy Manns -RN aware.

## 2016-07-05 NOTE — Progress Notes (Signed)
  Echocardiogram Echocardiogram Transesophageal has been performed.  [name] [name] [name] [name] 07/05/2016, 8:46 AM

## 2016-07-05 NOTE — Anesthesia Procedure Notes (Signed)
Procedure Name: Intubation Date/Time: 07/05/2016 8:00 AM Performed by: Clearnce Sorrel Pre-anesthesia Checklist: Patient identified, Emergency Drugs available, Suction available, Patient being monitored and Timeout performed Patient Re-evaluated:Patient Re-evaluated prior to inductionOxygen Delivery Method: Circle system utilized Preoxygenation: Pre-oxygenation with 100% oxygen Intubation Type: IV induction Ventilation: Mask ventilation without difficulty and Oral airway inserted - appropriate to patient size Laryngoscope Size: Mac and 3 Grade View: Grade I Tube type: Oral Endobronchial tube: Left and Double lumen EBT and 37 Fr Number of attempts: 2 Airway Equipment and Method: Stylet Placement Confirmation: ETT inserted through vocal cords under direct vision,  positive ETCO2 and breath sounds checked- equal and bilateral Tube secured with: Tape Dental Injury: Teeth and Oropharynx as per pre-operative assessment

## 2016-07-05 NOTE — Progress Notes (Signed)
CT surgery p.m. Rounds  Patient resting comfortably after mitral valve repair, maze Pulmonary status improved following placement of left chest tube for left pneumothorax Chest x-ray shows reexpansion the left lung following tube with asymmetric pulmonary edema of the right lung Hemodynamic stable Patient currently too sedated to initiate ventilator wean attempt

## 2016-07-05 NOTE — Op Note (Signed)
CARDIOTHORACIC SURGERY OPERATIVE NOTE  Date of Procedure:  07/05/2016  Preoperative Diagnosis: Left Pneumothorax  Postoperative Diagnosis: Same  Procedure:   Left chest tube placement  Surgeon:   Valentina Gu. Roxy Manns, MD  Anesthesia: 1% lidocaine local with intravenous sedation    DETAILS OF THE OPERATIVE PROCEDURE  The left anterior chest was prepared and draped in a sterile manner. 1% lidocaine was utilized to anesthetize the skin and subcutaneous tissues. A small incision was made and a 20 French straight chest tube was placed through the incision into the pleural space. The tube was secured to the skin and connected to a closed suction collection device. The patient tolerated the procedure well. A portable CXR was ordered. There were no complications.    Valentina Gu. Roxy Manns, MD 07/05/2016 3:08 PM

## 2016-07-05 NOTE — Op Note (Signed)
CARDIOTHORACIC SURGERY OPERATIVE NOTE  Date of Procedure:   07/05/2016  Preoperative Diagnosis:    Severe Mitral Regurgitation  Long-standing Persistent Atrial Fibrillation  Postoperative Diagnosis: Same  Procedure:    Minimally-Invasive Mitral Valve Repair  Complex valvuloplasty including artificial Gore-tex neochord placement x6  Sorin Memo 3D Ring Annuloplasty (size 17mm, catalog #[id number], serial #[id number])   Minimally-Invasive Maze Procedure  Complete bilateral atrial lesion set using cryothermy and bipolar radiofrequency ablation  Clipping of Left Atrial Appendage (Atricure Pro245, size 45 mm)  Surgeon: Valentina Gu. 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  Moderate mitral annular calcification  Type II dysfunction with severe mitral regurgitation  Normal LV systolic function   Mild-moderate (2+) tricuspid regurgitation with normal tricuspid annulus dimensions  No residual mitral regurgitation after successful valve repair                  BRIEF CLINICAL NOTE AND INDICATIONS FOR SURGERY  congestive heart failure, COPD, hyperlipidemia, remote history of colon cancer, and more recent history of mild intermittent rectal bleeding attributed to hemorrhoids who has been referred for surgical consultation to discuss treatment options for management of severe mitral regurgitation. The patient states that she has a long history of symptoms of exertional shortness breath dating back many years. She is a nonsmoker who has been diagnosed with COPD,although she was exposed to secondhand smoke by her husband who smokes a lot. Approximately 3 years ago she was noted to have an irregular heart rhythm and diagnosed with atrial fibrillation. She underwent an echocardiogram and was found to have mitral regurgitation.  She was started on Xarelto and followed for several years by a cardiologist with [facility]. Her cardiologist moved away and she was referred to Dr. [name] in September 2017because of worsening symptoms of shortness of breath. At that time she was noted to be in rate controlled persistent atrial fibrillation. It is unclear how long it has been since she was in sinus rhythm, but she has never been treated with antiarrhythmic agents nor had DC cardioversion performed. Follow-up echocardiogram performed 01/25/2016 revealed normal left ventricular systolic function with an eccentric jet of mitral regurgitation that was reported as moderate to severe with associated severe left atrial enlargement and moderate to severe tricuspid regurgitation. She subsequently underwent TEE on 02/20/2016 that revealed mitral valve prolapse with a very eccentric jet of regurgitation and moderate to severe mitral regurgitation with normal left ventricular systolic function, ejection fraction estimated 60-65%, severe left atrial enlargement, and severe tricuspid regurgitation. Left and right heart catheterization was performed 04/12/2016 and revealed mild nonobstructive coronary artery disease with mild pulmonary hypertension and severe (4+) mitral regurgitation. The patient was referred for elective surgical consultation.  The patient has been seen in consultation and counseled at length regarding the indications, risks and potential benefits of surgery.  All questions have been answered, and the patient provides full informed consent for the operation as described.     DETAILS OF THE OPERATIVE PROCEDURE  Preparation:  The patient is brought to the operating room on the above mentioned date and central monitoring was established by the anesthesia team including placement of Swan-Ganz catheter through the left internal jugular vein.  A radial arterial line is placed. The patient is placed in the supine position on the  operating table.  Intravenous antibiotics are administered. General endotracheal anesthesia is induced uneventfully. The patient is  initially intubated using a dual lumen endotracheal tube.  A Foley catheter is placed.  Baseline transesophageal echocardiogram was performed.  Findings were notable for mitral valve prolapse with a flail segment of the posterior leaflet causing severe mitral regurgitation. There was also significant calcification in the posterior mitral annulus. There was normal left ventricular systolic function. There was moderate to severe left atrial enlargement. The tricuspid annulus was normal diameter, measuring 3.4 cm. There was mild to moderate (2+) central tricuspid regurgitation.  A soft roll is placed behind the patient's left scapula and the neck gently extended and turned to the left.   The patient's right neck, chest, abdomen, both groins, and both lower extremities are prepared and draped in a sterile manner. A time out procedure is performed.   Surgical Approach:  A right miniature anterolateral thoracotomy incision is performed. The incision is placed just lateral to and superior to the right nipple. The pectoralis major muscle is retracted medially and completely preserved. The right pleural space is entered through the 3re intercostal space. A soft tissue retractor is placed.  Two 11 mm ports are placed through separate stab incisions inferiorly. The right pleural space is insufflated continuously with carbon dioxide gas through the posterior port during the remainder of the operation.  A longitudinal incision is made in the pericardium 3 cm anterior to the phrenic nerve and silk traction sutures are placed on either side of the incision for exposure.  Of note, during this portion of the operation the right lung did not collapse well during periods of single lung ventilation. The anesthesiologist was notified and attempts to verify appropriate endotracheal tube position  were performed. Good lung separation was never achieved.   Extracorporeal Cardiopulmonary Bypass:  A small incision is made in the right inguinal crease and the anterior surface of the right common femoral artery and right common femoral vein are identified.  The patient is placed in Trendelenburg position. The right internal jugular vein is cannulated with Seldinger technique and a guidewire advanced into the right atrium. The patient is heparinized systemically. The right internal jugular vein is cannulated with a 14 [place] pediatric femoral venous cannula. Pursestring sutures are placed on the anterior surface of the right common femoral vein and right common femoral artery. The right common femoral vein is cannulated with the Seldinger technique and a guidewire is advanced under transesophageal echocardiogram guidance through the right atrium. The femoral vein is cannulated with a long 22 French femoral venous cannula. The right common femoral artery is cannulated with Seldinger technique and a flexible guidewire is advanced until it can be appreciated intraluminally in the descending thoracic aorta on transesophageal echocardiogram. The femoral artery is cannulated with an 18 French femoral arterial cannula.  Adequate heparinization is verified.      The entire pre-bypass portion of the operation was notable for stable hemodynamics.  Cardiopulmonary bypass was begun.  Vacuum assist venous drainage is utilized. The incision in the pericardium is extended in both directions. Venous drainage and exposure are notably excellent.    Clipping of Left Atrial Appendage:  The left atrial appendage is obliterated using an Atricure left atrial appendage clip (Atriclip Pro245, size 37mm).  The clip is applied under thoracoscopic visualization posterior to the aorta and pulmonary artery through the oblique sinus.  The clip was applied prior to application of the aortic crossclamp, with transesophageal  echocardiographic confirmation that the clip satisfactorily obliterates the appendage.   Myocardial Protection:  A retrograde cardioplegia cannula is placed through  the right atrium into the coronary sinus using transesophageal echocardiogram guidance.  An antegrade cardioplegia cannula is placed in the ascending aorta.  The patient is cooled to 28C systemic temperature.  The aortic cross clamp is applied and cardioplegia is delivered initially in an antegrade fashion through the aortic root using modified [place] cold blood cardioplegia (KBC protocol).   The initial cardioplegic arrest is rapid with early diastolic arrest.  After an initial arresting dose of 1100 mL given antegrade, an additional 400 mL is given retrograde through the coronary sinus.  Myocardial protection was felt to be excellent.   Maze Procedure (left atrial lesion set):  Following placement of the aortic crossclamp and the administration of the initial arresting dose of cardioplegia, a left atriotomy incision was performed through the interatrial groove and extended partially across the back wall of the left atrium after opening the oblique sinus inferiorly.  The mitral valve and floor of the left atrium are exposed using a self-retaining retractor.    The Atricure CryoICE nitrous oxide cryothermy system is utilized for all cryothermy ablation lesions.  The left atrial lesion set of the Cox cryomaze procedure is now performed using 3 minute duration for all cryothermy lesions.  Initially a lesion is placed along the endocardial surface of the left atrium from the caudad apex of the atriotomy incision across the posterior wall of the left atrium onto the posterior mitral annulus.  A mirror image lesion along the epicardial surface is then performed with the probe posterior to the left atrium, crossing over the coronary sinus.  Two lesions are then performed to create a box isolating all of the pulmonary veins from the remainder of  the left atrium.  The first lesion is placed from the cephalad apex of the atriotomy incision across the dome of the left atrium to just anterior to the left sided pulmonary veins.  The second lesion completes the box from the caudad apex of the atriotomy incision across the back wall of the left atrium to connect with the previous lesion just anterior to the left sided pulmonary veins.  Finally, the AtriCure Synergy bipolar radiofrequency ablation clamp is utilized to create around the right-sided pulmonary veins.   Mitral Valve Repair:  The mitral valve was inspected and notable for fibroelastic deficiency type myxomatous degenerative disease of the mitral valve.  There is an obvious flail segment (P1) of the posterior leaflet with several ruptured primary chordae tendineae.  There is moderate calcification in the posterior mitral annulus but the calcification does not extend into the posterior leaflet and posterior leaflet mobility is otherwise normal.   Artificial neochord placement was performed using Chord-X multi-strand CV-4 Goretex pre-measured loops.  The appropriate cord length was measured from corresponding normal length primary cords from the P2 segment of the posterior leaflet. The papillary muscle suture of the Chord-X multi-strand suture was placed through the head of the anterior papillary muscle in a horizontal mattress fashion and tied over Teflon felt pledgets. Each of the three pre-measured loops were then reimplanted into the free margin of the P1 segment of the posterior leaflet.    Interrupted 2-0 Ethibond horizontal mattress sutures are placed circumferentially around the entire mitral valve annulus. The sutures will ultimately be utilized for ring annuloplasty, and at this juncture there are utilized to suspend the valve symmetrically.  The valve is tested with saline and appears reasonably competent even prior to ring annuloplasty.  The valve is sized to accept a 30 mm  annuloplasty  ring based upon the distance between the left and right commissures, the height and the surface area of the anterior leaflet.  A Sorin Memo 3D annuloplasty ring (size 30 mm, catalog # [id number], serial # [id number]) is implanted uneventfully.  All ring sutures were secured using a Cor-knot device.  The valve is again tested with saline and appears to be perfectly competent with a broad symmetrical line of coaptation of the anterior and posterior leaflet. There is no residual leak. Rewarming is begun.  The atriotomy was closed using a 2-layer closure of running 3-0 Prolene suture after placing a sump drain across the mitral valve to serve as a left ventricular vent.  One final dose of warm retrograde "reanimation dose" cardioplegia was administered retrograde through the coronary sinus catheter while all air was evacuated through the aortic root.  The aortic cross clamp was removed after a total cross clamp time of 111 minutes.   Maze Procedure (right atrial lesion set):  The retrograde cardioplegia cannula was removed and the small hole in the right atrium extended a short distance.  The AtriCure Synergy bipolar radiofrequency ablation clamp is utilized to create a series of linear lesions in the right atrium, each with one limb of the clamp along the endocardial surface and the other along the epicardial surface. The first lesion is placed from the posterior apex of the atriotomy incision and along the lateral wall of the right atrium to reach the lateral aspect of the superior vena cava. A second lesion is placed in the opposite direction from the posterior apex of the atriotomy incision along the lateral wall to reach the lateral aspect of the inferior vena cava. A third lesion is placed from the midportion of the atriotomy incision extending at a right angle to reach the tip of the right atrial appendage. A fourth lesion is placed from the anterior apex of the atriotomy incision in an anterior and  inferior direction to reach the acute margin of the heart. Finally, the cryotherapy probe is utilized to complete the right atrial lesion set by placing the probe along the endocardial surface of the right atrium from the anterior apex of the atriotomy incision to reach the tricuspid annulus at the 2:00 position. The atriotomy incision is closed with a 2 layer closure of running 4-0 Prolene suture.   Procedure Completion:  Epicardial pacing wires are fixed to the inferior wall of the right ventricule and to the right atrial appendage. The patient is rewarmed to 37C temperature. The left ventricular vent is removed.  The patient is ventilated and flow volumes turndown while the mitral valve repair is inspected using transesophageal echocardiogram. The valve repair appears intact with no residual leak. The antegrade cardioplegia cannula is now removed. The patient is weaned and disconnected from cardiopulmonary bypass.  The patient's rhythm at separation from bypass was sinus.  The patient was weaned from bypass without any inotropic support. Total cardiopulmonary bypass time for the operation was 170 minutes.  Followup transesophageal echocardiogram performed after separation from bypass revealed a well-seated annuloplasty ring in the mitral position with a normal functioning mitral valve. There was no residual leak.  Left ventricular function was unchanged from preoperatively.  There remained mild (1+/2+) central tricuspid regurgitation.  The femoral arterial and venous cannulae were removed uneventfully. There was a palpable pulse in the distal right common femoral artery after removal of the cannula. Protamine was administered to reverse the anticoagulation. The right internal jugular cannula was removed and manual pressure held  on the neck for 15 minutes.  Single lung ventilation was begun. The atriotomy closure was inspected for hemostasis. The pericardial sac was drained using a 28 French Bard drain  placed through the anterior port incision.  The pericardium was closed using a patch of core matrix bovine submucosal tissue patch. The right pleural space is irrigated with saline solution and inspected for hemostasis. The right pleural space was drained using a 28 French Bard drain placed through the posterior port incision. The miniature thoracotomy incision was closed in multiple layers in routine fashion. The right groin incision was inspected for hemostasis and closed in multiple layers in routine fashion.  The post-bypass portion of the operation was notable for stable rhythm and hemodynamics.  No blood products were administered during the operation.   Disposition:  The patient tolerated the procedure well.  The patient was reintubated using a single lumen endotracheal tube and subsequently transported to the surgical intensive care unit in stable condition. There were no intraoperative complications. All sponge instrument and needle counts are verified correct at completion of the operation.    Valentina Gu. Roxy Manns MD 07/05/2016 1:38 PM

## 2016-07-05 NOTE — Brief Op Note (Addendum)
07/05/2016  12:25 PM  PATIENT:  [name] [name]  76 y.o. 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

## 2016-07-05 NOTE — Anesthesia Procedure Notes (Addendum)
Procedures

## 2016-07-05 NOTE — Interval H&P Note (Signed)
History and Physical Interval Note:  07/05/2016 6:18 AM  [name] [name]  has presented today for surgery, with the diagnosis of MR TR AFIB  The various methods of treatment have been discussed with the patient and family. After consideration of risks, benefits and other options for treatment, the patient has consented to  Procedure(s): MINIMALLY INVASIVE MITRAL VALVE REPAIR (MVR) (Right) MINIMALLY INVASIVE MAZE PROCEDURE (N/A) POSSIBLE MINIMALLY INVASIVE TRICUSPID VALVE REPAIR (Right) TRANSESOPHAGEAL ECHOCARDIOGRAM (TEE) (N/A) as a surgical intervention .  The patient's history has been reviewed, patient examined, no change in status, stable for surgery.  I have reviewed the patient's chart and labs.  Questions were answered to the patient's satisfaction.     Rexene Alberts

## 2016-07-05 NOTE — Transfer of Care (Signed)
Immediate Anesthesia Transfer of Care Note  Patient: [name] [name]  Procedure(s) Performed: Procedure(s): MINIMALLY INVASIVE MITRAL VALVE REPAIR (MVR) (Right) MINIMALLY INVASIVE MAZE PROCEDURE (N/A) TRANSESOPHAGEAL ECHOCARDIOGRAM (TEE) (N/A) CLIPPING OF ATRIAL APPENDAGE  Patient Location: SICU  Anesthesia Type:General  Level of Consciousness: Patient remains intubated per anesthesia plan  Airway & Oxygen Therapy: Patient remains intubated per anesthesia plan  Post-op Assessment: Report given to RN and Post -op Vital signs reviewed and stable  Post vital signs: Reviewed and stable  Last Vitals:  Vitals:   07/05/16 0603 07/05/16 1410  BP: 115/70 104/71  Pulse: 88 89  Resp: 16 (!) 24  Temp: 36.7 C     Last Pain:  Vitals:   07/05/16 0603  TempSrc: Oral      Patients Stated Pain Goal: 4 (90/30/09 2330)  Complications: No apparent anesthesia complications

## 2016-07-05 NOTE — Anesthesia Postprocedure Evaluation (Signed)
Anesthesia Post Note  Patient: [name] [name]  Procedure(s) Performed: Procedure(s) (LRB): MINIMALLY INVASIVE MITRAL VALVE REPAIR (MVR) (Right) MINIMALLY INVASIVE MAZE PROCEDURE (N/A) TRANSESOPHAGEAL ECHOCARDIOGRAM (TEE) (N/A) CLIPPING OF ATRIAL APPENDAGE  Patient location during evaluation: SICU Anesthesia Type: General Level of consciousness: sedated Pain management: pain level controlled Vital Signs Assessment: post-procedure vital signs reviewed and stable Respiratory status: patient remains intubated per anesthesia plan Cardiovascular status: stable Anesthetic complications: no       Last Vitals:  Vitals:   07/05/16 1450 07/05/16 1500  BP:  102/71  Pulse: 90 89  Resp: 12 12  Temp: 36.1 C 36.2 C    Last Pain:  Vitals:   07/05/16 0603  TempSrc: Oral                 Khady Vandenberg DAVID

## 2016-07-05 NOTE — Anesthesia Preprocedure Evaluation (Signed)
Anesthesia Evaluation  Patient identified by MRN, date of birth, ID band Patient awake    Reviewed: Allergy & Precautions, NPO status , Patient's Chart, lab work & pertinent test results  History of Anesthesia Complications (+) PONV  Airway Mallampati: I  TM Distance: >3 FB Neck ROM: Full    Dental   Pulmonary COPD,    Pulmonary exam normal        Cardiovascular Normal cardiovascular exam+ dysrhythmias Atrial Fibrillation + Valvular Problems/Murmurs MR      Neuro/Psych Anxiety Depression    GI/Hepatic GERD  Medicated and Controlled,  Endo/Other    Renal/GU      Musculoskeletal   Abdominal   Peds  Hematology   Anesthesia Other Findings   Reproductive/Obstetrics                             Anesthesia Physical Anesthesia Plan  ASA: III  Anesthesia Plan: General   Post-op Pain Management:    Induction: Intravenous  Airway Management Planned: Double Lumen EBT  Additional Equipment: Arterial line, CVP, PA Cath, TEE, Ultrasound Guidance Line Placement and 3D TEE  Intra-op Plan:   Post-operative Plan: Post-operative intubation/ventilation  Informed Consent: I have reviewed the patients History and Physical, chart, labs and discussed the procedure including the risks, benefits and alternatives for the proposed anesthesia with the patient or authorized representative who has indicated his/her understanding and acceptance.     Plan Discussed with: CRNA and Surgeon  Anesthesia Plan Comments:         Anesthesia Quick Evaluation

## 2016-07-06 ENCOUNTER — Inpatient Hospital Stay (HOSPITAL_COMMUNITY): Payer: Medicare Other

## 2016-07-06 ENCOUNTER — Encounter (HOSPITAL_COMMUNITY): Payer: Self-pay | Admitting: Thoracic Surgery (Cardiothoracic Vascular Surgery)

## 2016-07-06 LAB — GLUCOSE, CAPILLARY
GLUCOSE-CAPILLARY: 124 mg/dL — AB (ref 65–99)
Glucose-Capillary: 156 mg/dL — ABNORMAL HIGH (ref 65–99)
Glucose-Capillary: 133 mg/dL — ABNORMAL HIGH (ref 65–99)

## 2016-07-06 LAB — POCT I-STAT 3, ART BLOOD GAS (G3+)
ACID-BASE DEFICIT: 1 mmol/L (ref 0.0–2.0)
ACID-BASE DEFICIT: 1 mmol/L (ref 0.0–2.0)
BICARBONATE: 26.1 mmol/L (ref 20.0–28.0)
BICARBONATE: 24.9 mmol/L (ref 20.0–28.0)
Bicarbonate: 26.4 mmol/L (ref 20.0–28.0)
O2 SAT: 98 %
O2 SAT: 97 %
O2 Saturation: 98 %
PH ART: 7.288 — AB (ref 7.350–7.450)
PO2 ART: 95 mmHg (ref 83.0–108.0)
TCO2: 28 mmol/L (ref 0–100)
TCO2: 28 mmol/L (ref 0–100)
TCO2: 26 mmol/L (ref 0–100)
pCO2 arterial: 55.1 mmHg — ABNORMAL HIGH (ref 32.0–48.0)
pCO2 arterial: 63.4 mmHg — ABNORMAL HIGH (ref 32.0–48.0)
pCO2 arterial: 46.7 mmHg (ref 32.0–48.0)
pH, Arterial: 7.246 — ABNORMAL LOW (ref 7.350–7.450)
pH, Arterial: 7.33 — ABNORMAL LOW (ref 7.350–7.450)
pO2, Arterial: 111 mmHg — ABNORMAL HIGH (ref 83.0–108.0)
pO2, Arterial: 146 mmHg — ABNORMAL HIGH (ref 83.0–108.0)

## 2016-07-06 LAB — BASIC METABOLIC PANEL
Anion gap: 8 (ref 5–15)
BUN: 10 mg/dL (ref 6–20)
CHLORIDE: 107 mmol/L (ref 101–111)
CO2: 26 mmol/L (ref 22–32)
CREATININE: 0.83 mg/dL (ref 0.44–1.00)
Calcium: 7.7 mg/dL — ABNORMAL LOW (ref 8.9–10.3)
GFR calc Af Amer: >60 mL/min (ref >60)
GFR calc non Af Amer: >60 mL/min (ref >60)
Glucose, Bld: 132 mg/dL — ABNORMAL HIGH (ref 65–99)
Potassium: 4.3 mmol/L (ref 3.5–5.1)
Sodium: 141 mmol/L (ref 135–145)

## 2016-07-06 LAB — CBC
HCT: 31.6 % — ABNORMAL LOW (ref 36.0–46.0)
HEMATOCRIT: 31 % — AB (ref 36.0–46.0)
HEMOGLOBIN: 9.7 g/dL — AB (ref 12.0–15.0)
Hemoglobin: 9.7 g/dL — ABNORMAL LOW (ref 12.0–15.0)
MCH: 29.8 pg (ref 26.0–34.0)
MCH: 29.5 pg (ref 26.0–34.0)
MCHC: 31.3 g/dL (ref 30.0–36.0)
MCHC: 30.7 g/dL (ref 30.0–36.0)
MCV: 95.1 fL (ref 78.0–100.0)
MCV: 96 fL (ref 78.0–100.0)
PLATELETS: 166 10*3/uL (ref 150–400)
Platelets: 191 10*3/uL (ref 150–400)
RBC: 3.26 MIL/uL — ABNORMAL LOW (ref 3.87–5.11)
RBC: 3.29 MIL/uL — AB (ref 3.87–5.11)
RDW: 13.9 % (ref 11.5–15.5)
RDW: 14 % (ref 11.5–15.5)
WBC: 16.4 10*3/uL — AB (ref 4.0–10.5)
WBC: 17.8 10*3/uL — ABNORMAL HIGH (ref 4.0–10.5)

## 2016-07-06 LAB — CREATININE, SERUM
CREATININE: 1.08 mg/dL — AB (ref 0.44–1.00)
GFR, EST AFRICAN AMERICAN: 56 mL/min — AB (ref >60)
GFR, EST NON AFRICAN AMERICAN: 49 mL/min — AB (ref >60)

## 2016-07-06 LAB — POCT I-STAT, CHEM 8
BUN: 17 mg/dL (ref 6–20)
CALCIUM ION: 1.2 mmol/L (ref 1.15–1.40)
CHLORIDE: 101 mmol/L (ref 101–111)
CREATININE: 1 mg/dL (ref 0.44–1.00)
GLUCOSE: 145 mg/dL — AB (ref 65–99)
HCT: 31 % — ABNORMAL LOW (ref 36.0–46.0)
Hemoglobin: 10.5 g/dL — ABNORMAL LOW (ref 12.0–15.0)
POTASSIUM: 4.2 mmol/L (ref 3.5–5.1)
Sodium: 142 mmol/L (ref 135–145)
TCO2: 27 mmol/L (ref 0–100)

## 2016-07-06 LAB — HEMOGLOBIN AND HEMATOCRIT, BLOOD
HEMATOCRIT: 22.2 % — AB (ref 36.0–46.0)
Hemoglobin: 7.2 g/dL — ABNORMAL LOW (ref 12.0–15.0)

## 2016-07-06 LAB — MAGNESIUM
Magnesium: 2.4 mg/dL (ref 1.7–2.4)
Magnesium: 2.4 mg/dL (ref 1.7–2.4)

## 2016-07-06 LAB — PLATELET COUNT: Platelets: 162 10*3/uL (ref 150–400)

## 2016-07-06 MED ORDER — LACTATED RINGERS IV SOLN: INTRAVENOUS | Status: DC

## 2016-07-06 MED ORDER — TRAMADOL HCL 50 MG PO TABS: 50.0000 mg | ORAL_TABLET | Freq: Two times a day (BID) | ORAL | Status: DC | PRN

## 2016-07-06 MED ORDER — FUROSEMIDE 10 MG/ML IJ SOLN
20.0000 mg | Freq: Four times a day (QID) | INTRAMUSCULAR | Status: AC
  Administered 2016-07-06 (×3): 20 mg via INTRAVENOUS
  Filled 2016-07-06 (×3): qty 2

## 2016-07-06 MED ORDER — PAROXETINE HCL 20 MG PO TABS
20.0000 mg | ORAL_TABLET | Freq: Every day | ORAL | Status: DC
  Administered 2016-07-07 – 2016-07-14 (×8): 20 mg via ORAL
  Filled 2016-07-06 (×8): qty 1

## 2016-07-06 MED ORDER — WARFARIN - PHYSICIAN DOSING INPATIENT
Freq: Every day | Status: DC
  Administered 2016-07-06 – 2016-07-08 (×3)

## 2016-07-06 MED ORDER — ALPRAZOLAM 0.25 MG PO TABS
0.2500 mg | ORAL_TABLET | Freq: Two times a day (BID) | ORAL | Status: DC | PRN
  Administered 2016-07-06 – 2016-07-11 (×3): 0.25 mg via ORAL
  Filled 2016-07-06 (×3): qty 1

## 2016-07-06 MED ORDER — ENOXAPARIN SODIUM 30 MG/0.3ML ~~LOC~~ SOLN
30.0000 mg | SUBCUTANEOUS | Status: DC
  Administered 2016-07-07 – 2016-07-11 (×4): 30 mg via SUBCUTANEOUS
  Filled 2016-07-06 (×5): qty 0.3

## 2016-07-06 MED ORDER — WARFARIN SODIUM 2.5 MG PO TABS
2.5000 mg | ORAL_TABLET | Freq: Every day | ORAL | Status: DC
  Administered 2016-07-06 – 2016-07-08 (×3): 2.5 mg via ORAL
  Filled 2016-07-06 (×3): qty 1

## 2016-07-06 MED ORDER — LEVALBUTEROL HCL 1.25 MG/0.5ML IN NEBU
1.2500 mg | INHALATION_SOLUTION | Freq: Four times a day (QID) | RESPIRATORY_TRACT | Status: DC
  Administered 2016-07-06 (×3): 1.25 mg via RESPIRATORY_TRACT
  Filled 2016-07-06 (×3): qty 0.5

## 2016-07-06 MED ORDER — AMIODARONE HCL 200 MG PO TABS
200.0000 mg | ORAL_TABLET | Freq: Every day | ORAL | Status: DC
  Administered 2016-07-07 – 2016-07-14 (×8): 200 mg via ORAL
  Filled 2016-07-06 (×8): qty 1

## 2016-07-06 MED FILL — Sodium Chloride IV Soln 0.9%: INTRAVENOUS | Qty: 2000 | Status: AC

## 2016-07-06 MED FILL — Mannitol IV Soln 20%: INTRAVENOUS | Qty: 500 | Status: AC

## 2016-07-06 MED FILL — Heparin Sodium (Porcine) Inj 1000 Unit/ML: INTRAMUSCULAR | Qty: 2500 | Status: AC

## 2016-07-06 MED FILL — Electrolyte-R (PH 7.4) Solution: INTRAVENOUS | Qty: 3000 | Status: AC

## 2016-07-06 MED FILL — Heparin Sodium (Porcine) Inj 1000 Unit/ML: INTRAMUSCULAR | Qty: 30 | Status: AC

## 2016-07-06 MED FILL — Heparin Sodium (Porcine) Inj 1000 Unit/ML: INTRAMUSCULAR | Qty: 90 | Status: AC

## 2016-07-06 MED FILL — Sodium Bicarbonate IV Soln 8.4%: INTRAVENOUS | Qty: 50 | Status: AC

## 2016-07-06 NOTE — Progress Notes (Signed)
      [address]       [place] [address]             [phone]      POD # 1 mitral repair  Sleeping currently  BP (!) 107/58   Pulse 83   Temp (!) 96.3 F (35.7 C) (Oral)   Resp 17   Ht 5\' 2"  (1.575 m)   Wt 158 lb 8.2 oz (71.9 kg)   SpO2 96%   BMI 28.99 kg/m    Intake/Output Summary (Last 24 hours) at 07/06/16 1722 Last data filed at 07/06/16 1711  Gross per 24 hour  Intake           2622.3 ml  Output             3455 ml  Net           -832.7 ml   Hct= 31 K= 4.2 Cr= 1.0  Doing well post extubation  Remo Lipps C. Roxan Hockey, MD [facility] [phone]

## 2016-07-06 NOTE — Progress Notes (Signed)
Patient up to chair for two hours this am, ambulated patient in hallway 25 ft-patient had some symptomatic orthostatic hypotension systolic pressure 03P, diaphretic/nauseous, patient back in bed with call bell in reach, VS stable with BP back up to 119/68. Will continue to monitor.  Rowe Pavy, RN

## 2016-07-06 NOTE — Progress Notes (Signed)
Dr Prescott Gum paged For am lab work notification. Awaiting call back.

## 2016-07-06 NOTE — Procedures (Signed)
Extubation Procedure Note  Patient Details:   Name: [name] [name] DOB: Aug 04, 1940 MRN: [id number]   Airway Documentation:  Airway (Active)  Secured at (cm) 21 cm 07/06/2016  6:50 AM  Measured From Lips 07/06/2016  6:50 AM  Secured Location Right 07/06/2016  5:20 AM  Secured By Pink Tape 07/06/2016  6:50 AM  Tube Holder Repositioned Yes 07/05/2016  2:10 PM  Site Condition Dry 07/06/2016  5:20 AM    Evaluation  O2 sats: stable throughout Complications: No apparent complications Patient did tolerate procedure well. Bilateral Breath Sounds: Diminished   Yes   PT was extubated to 4L [place]  PT was able to speak, sats are stable  RT to monitor  Josiane Labine, Leonie Douglas 07/06/2016, 7:59 AM

## 2016-07-06 NOTE — Progress Notes (Signed)
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  Reports mild soreness in chest.  Wants ET tube out  Objective: Vital signs: BP Readings from Last 1 Encounters:  07/06/16 116/64   Pulse Readings from Last 1 Encounters:  07/06/16 80   Resp Readings from Last 1 Encounters:  07/06/16 16   Temp Readings from Last 1 Encounters:  07/06/16 98.6 F (37 C)    Hemodynamics: PAP: (29-41)/(8-35) 38/35 CO:  [2.7 L/min-5.6 L/min] 5.6 L/min CI:  [1.6 L/min/m2-3.3 L/min/m2] 3.3 L/min/m2  Physical Exam:  Rhythm:   Sinus - AAI paced  Breath sounds: clear  Heart sounds:  RRR  Incisions:  Dressings dry, intact  Abdomen:  Soft, non-distended, non-tender  Extremities:  Warm, well-perfused  Chest tubes:  low volume thin serosanguinous output, no air leak    Intake/Output from previous day: 03/15 0701 - 03/16 0700 In: 7047.4 [I.V.:4701.4; Blood:241; NG/GT:90; IV [id number]:2440] Out: 1027 [Urine:3265; Blood:700; Chest Tube:470] Intake/Output this shift: No intake/output data recorded.  Lab Results:  CBC: Recent Labs  07/05/16 2034 07/05/16 2049 07/06/16 0422  WBC 16.4*  --  16.4*  HGB 10.5* 10.9* 9.7*  HCT 34.0* 32.0* 31.0*  PLT 195  --  191    BMET:  Recent Labs  07/03/16 1157  07/05/16 2049 07/06/16 0422  NA 137  < > 140 141  K 4.2  < > 3.7 4.3  CL 101  < > 104 107  CO2 24  --   --  26  GLUCOSE 107*  < > 164* 132*  BUN 10  < > 11 10  CREATININE 0.67  < > 0.60 0.83  CALCIUM 8.9  --   --  7.7*  < > = values in this interval not displayed.   PT/INR:   Recent Labs  07/05/16 1415  LABPROT 17.3*  INR 1.40    CBG (last 3)   Recent Labs  07/05/16 1607 07/05/16 2044 07/05/16 2322  GLUCAP 101* 139* 143*    ABG    Component Value Date/Time   PHART 7.288 (L) 07/06/2016 0545   PCO2ART 55.1 (H) 07/06/2016 0545   PO2ART 111.0 (H) 07/06/2016 0545   HCO3 26.4 07/06/2016 0545   TCO2 28 07/06/2016 0545   ACIDBASEDEF 1.0 07/06/2016 0545   O2SAT 98.0 07/06/2016 0545    CXR: PORTABLE CHEST 1 VIEW  COMPARISON:  07/05/2016 .  FINDINGS: Endotracheal tube and NG tube in stable position. Bilateral chest tubes noted. Left chest tube is been withdrawn slightly. Swan-Ganz catheter in stable position. Left atrial appendage clip noted stable position. Cardiomegaly with mild bilateral interstitial prominence and bilateral pleural effusions. Findings suggest CHF. Low lung volumes.  IMPRESSION: 1. Endotracheal tube, NG tube, Swan-Ganz catheter in stable position. Bilateral chest tubes are noted again noted. Left chest tube has been withdrawn slightly. No pneumothorax.  2. Left atrial appendage clip in stable position. Cardiomegaly with pulmonary venous congestion and bilateral pulmonary interstitial prominence with small pleural effusions again noted. Findings consistent with CHF .   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  Looks wide awake and more than ready for extubation Maintaining NSR w/ stable hemodynamics on no drips O2 sats 98-100% and CXR looks good w/ near complete resolution of left sided opacity noted on initial postop films Left pneumothorax likely related to central line placement, resolved w/ chest tube placement Chronic diastolic CHF with expected post-op volume  excess, mild Long-standing persistent atrial fibrillation now s/p maze procedure Expected post op acute blood loss anemia, Hgb 9.7 this morning COPD Obesity GERD   Extubate  Mobilize  Diuresis  Pulm toilet  D/C lines  Leave chest tubes for now  Start Coumadin    Rexene Alberts, MD 07/06/2016 7:50 AM

## 2016-07-06 NOTE — Progress Notes (Signed)
07/06/2016- 0550- Respiratory care note- pulmonary mechanics done NIF-45, FVC 630ml, VT 382ml

## 2016-07-06 NOTE — Plan of Care (Signed)
Problem: Cardiac: Goal: Ability to maintain an adequate cardiac output will improve Outcome: Completed/Met Date Met: 07/06/16 CI> 2 on minimal neo gtt Goal: Will show no signs and symptoms of excessive bleeding Outcome: Completed/Met Date Met: 07/06/16 Minimal ct drainage

## 2016-07-07 ENCOUNTER — Inpatient Hospital Stay (HOSPITAL_COMMUNITY): Payer: Medicare Other

## 2016-07-07 LAB — GLUCOSE, CAPILLARY
GLUCOSE-CAPILLARY: 128 mg/dL — AB (ref 65–99)
GLUCOSE-CAPILLARY: 119 mg/dL — AB (ref 65–99)
GLUCOSE-CAPILLARY: 148 mg/dL — AB (ref 65–99)
GLUCOSE-CAPILLARY: 99 mg/dL (ref 65–99)
Glucose-Capillary: 125 mg/dL — ABNORMAL HIGH (ref 65–99)
Glucose-Capillary: 153 mg/dL — ABNORMAL HIGH (ref 65–99)
Glucose-Capillary: 136 mg/dL — ABNORMAL HIGH (ref 65–99)

## 2016-07-07 LAB — CBC
HEMATOCRIT: 30.2 % — AB (ref 36.0–46.0)
HEMOGLOBIN: 9.4 g/dL — AB (ref 12.0–15.0)
MCH: 30 pg (ref 26.0–34.0)
MCHC: 31.1 g/dL (ref 30.0–36.0)
MCV: 96.5 fL (ref 78.0–100.0)
PLATELETS: 147 10*3/uL — AB (ref 150–400)
RBC: 3.13 MIL/uL — ABNORMAL LOW (ref 3.87–5.11)
RDW: 14.5 % (ref 11.5–15.5)
WBC: 14.1 10*3/uL — ABNORMAL HIGH (ref 4.0–10.5)

## 2016-07-07 LAB — BASIC METABOLIC PANEL
ANION GAP: 5 (ref 5–15)
BUN: 17 mg/dL (ref 6–20)
CALCIUM: 8.5 mg/dL — AB (ref 8.9–10.3)
CHLORIDE: 104 mmol/L (ref 101–111)
CO2: 27 mmol/L (ref 22–32)
Creatinine, Ser: 1.01 mg/dL — ABNORMAL HIGH (ref 0.44–1.00)
GFR calc Af Amer: >60 mL/min (ref >60)
GFR calc non Af Amer: 53 mL/min — ABNORMAL LOW (ref >60)
GLUCOSE: 134 mg/dL — AB (ref 65–99)
Potassium: 4.1 mmol/L (ref 3.5–5.1)
Sodium: 136 mmol/L (ref 135–145)

## 2016-07-07 LAB — PROTIME-INR
INR: 1.41
Prothrombin Time: 17.4 seconds — ABNORMAL HIGH (ref 11.4–15.2)

## 2016-07-07 MED ORDER — SODIUM CHLORIDE 0.9% FLUSH: 3.0000 mL | INTRAVENOUS | Status: DC | PRN

## 2016-07-07 MED ORDER — ASPIRIN EC 81 MG PO TBEC
81.0000 mg | DELAYED_RELEASE_TABLET | Freq: Every day | ORAL | Status: DC
  Administered 2016-07-08 – 2016-07-14 (×7): 81 mg via ORAL
  Filled 2016-07-07 (×7): qty 1

## 2016-07-07 MED ORDER — ALUM & MAG HYDROXIDE-SIMETH 200-200-20 MG/5ML PO SUSP: 15.0000 mL | ORAL | Status: DC | PRN

## 2016-07-07 MED ORDER — INSULIN ASPART 100 UNIT/ML ~~LOC~~ SOLN
0.0000 [IU] | Freq: Three times a day (TID) | SUBCUTANEOUS | Status: DC
  Administered 2016-07-07: 3 [IU] via SUBCUTANEOUS
  Administered 2016-07-07: 2 [IU] via SUBCUTANEOUS

## 2016-07-07 MED ORDER — SODIUM CHLORIDE 0.9% FLUSH
3.0000 mL | Freq: Two times a day (BID) | INTRAVENOUS | Status: DC
  Administered 2016-07-07 – 2016-07-14 (×12): 3 mL via INTRAVENOUS

## 2016-07-07 MED ORDER — MAGNESIUM HYDROXIDE 400 MG/5ML PO SUSP: 30.0000 mL | Freq: Every day | ORAL | Status: DC | PRN

## 2016-07-07 MED ORDER — POTASSIUM CHLORIDE CRYS ER 20 MEQ PO TBCR
20.0000 meq | EXTENDED_RELEASE_TABLET | Freq: Every day | ORAL | Status: DC
  Administered 2016-07-07 – 2016-07-08 (×2): 20 meq via ORAL
  Filled 2016-07-07 (×2): qty 2
  Filled 2016-07-07: qty 1
  Filled 2016-07-07: qty 2

## 2016-07-07 MED ORDER — LEVALBUTEROL HCL 1.25 MG/0.5ML IN NEBU
1.2500 mg | INHALATION_SOLUTION | Freq: Three times a day (TID) | RESPIRATORY_TRACT | Status: DC
  Filled 2016-07-07: qty 0.5

## 2016-07-07 MED ORDER — SODIUM CHLORIDE 0.9 % IV SOLN: 250.0000 mL | INTRAVENOUS | Status: DC | PRN

## 2016-07-07 MED ORDER — MOVING RIGHT ALONG BOOK
Freq: Once | Status: AC
  Administered 2016-07-07: 11:00:00
  Filled 2016-07-07: qty 1

## 2016-07-07 MED ORDER — LEVALBUTEROL HCL 1.25 MG/0.5ML IN NEBU
1.2500 mg | INHALATION_SOLUTION | Freq: Four times a day (QID) | RESPIRATORY_TRACT | Status: DC | PRN
  Filled 2016-07-07 (×2): qty 0.5

## 2016-07-07 MED ORDER — FUROSEMIDE 40 MG PO TABS
40.0000 mg | ORAL_TABLET | Freq: Every day | ORAL | Status: DC
  Administered 2016-07-07 – 2016-07-08 (×2): 40 mg via ORAL
  Filled 2016-07-07 (×2): qty 1

## 2016-07-07 NOTE — Progress Notes (Addendum)
All data logged in 3/16-3/17 1900-0700 per primary nurse [employer] RN [phone]

## 2016-07-07 NOTE — Progress Notes (Signed)
07/07/2016 1330 Received transfer into 2W22 from 2S.  Pt is A&O, no C/O voiced.  Tele monitor applied and CCMD notified.  Oriented to room, call light and bed.  Call bell in reach, family at bedside.   [name] [name]

## 2016-07-07 NOTE — Progress Notes (Signed)
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) MINIMALLY INVASIVE MAZE PROCEDURE (N/A) TRANSESOPHAGEAL ECHOCARDIOGRAM (TEE) (N/A) CLIPPING OF ATRIAL APPENDAGE Subjective: No complaints this AM  Objective: Vital signs in last 24 hours: Temp:  [96.3 F (35.7 C)-98.6 F (37 C)] 98.3 F (36.8 C) (03/17 0450) Pulse Rate:  [78-86] 79 (03/17 0800) Cardiac Rhythm: Junctional rhythm (03/17 0000) Resp:  [14-20] 19 (03/17 0800) BP: (96-124)/(49-87) 112/70 (03/17 0800) SpO2:  [78 %-100 %] 95 % (03/17 0800) Arterial Line BP: (90)/(48) 90/48 (03/16 0900) Weight:  [169 lb 12.1 oz (77 kg)] 169 lb 12.1 oz (77 kg) (03/17 0500)  Hemodynamic parameters for last 24 hours: CO:  [3.9 L/min] 3.9 L/min CI:  [2.3 L/min/m2] 2.3 L/min/m2  Intake/Output from previous day: 03/16 0701 - 03/17 0700 In: 402.5 [P.O.:60; I.V.:242.5; IV Piggyback:100] Out: 1605 [Urine:825; Chest Tube:780] Intake/Output this shift: Total I/O In: 10 [I.V.:10] Out: -   General appearance: alert, cooperative and no distress Neurologic: intact Heart: regular rate and rhythm Lungs: diminished breath sounds bilaterally Abdomen: normal findings: soft, non-tender no air leak on left, serous drainage on right  Lab Results:  Recent Labs  07/06/16 1618 07/06/16 1628 07/07/16 0500  WBC 17.8*  --  14.1*  HGB 9.7* 10.5* 9.4*  HCT 31.6* 31.0* 30.2*  PLT 166  --  147*   BMET:  Recent Labs  07/06/16 0422  07/06/16 1628 07/07/16 0500  NA 141  --  142 136  K 4.3  --  4.2 4.1  CL 107  --  101 104  CO2 26  --   --  27  GLUCOSE 132*  --  145* 134*  BUN 10  --  17 17  CREATININE 0.83  < > 1.00 1.01*  CALCIUM 7.7*  --   --  8.5*  < > = values in this interval not displayed.  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 Keep right CT until drainage subsides RENAL- creatinine and lytes OK- PO lasix ENDO- CBG well controlled Anemia secondary to ABL- follow Mobilize  Enoxaparin for DVT prophylaxis until INR up   LOS: 2 days    Melrose Nakayama 07/07/2016

## 2016-07-07 NOTE — Discharge Instructions (Addendum)
Information on my medicine - Coumadin   (Warfarin)  This medication education was reviewed with me or my healthcare representative as part of my discharge preparation.  Why was Coumadin prescribed for you? Coumadin was prescribed for you because you have a blood clot or a medical condition that can cause an increased risk of forming blood clots. Blood clots can cause serious health problems by blocking the flow of blood to the heart, lung, or brain. Coumadin can prevent harmful blood clots from forming. As a reminder your indication for Coumadin is:   Stroke Prevention Because Of Atrial Fibrillation, blood clot prevention after heart valve surgery  What test will check on my response to Coumadin? While on Coumadin (warfarin) you will need to have an INR test regularly to ensure that your dose is keeping you in the desired range. The INR (international normalized ratio) number is calculated from the result of the laboratory test called prothrombin time (PT).  If an INR APPOINTMENT HAS NOT ALREADY BEEN MADE FOR YOU please schedule an appointment to have this lab work done by your health care provider within 7 days. Your INR goal is usually a number between:  2 to 3 or your provider may give you a more narrow range like 2-2.5.  Ask your health care provider during an office visit what your goal INR is.  What  do you need to  know  About  COUMADIN? Take Coumadin (warfarin) exactly as prescribed by your healthcare provider about the same time each day.  DO NOT stop taking without talking to the doctor who prescribed the medication.  Stopping without other blood clot prevention medication to take the place of Coumadin may increase your risk of developing a new clot or stroke.  Get refills before you run out.  What do you do if you miss a dose? If you miss a dose, take it as soon as you remember on the same day then continue your regularly scheduled regimen the next day.  Do not take two doses of  Coumadin at the same time.  Important Safety Information A possible side effect of Coumadin (Warfarin) is an increased risk of bleeding. You should call your healthcare provider right away if you experience any of the following: ? Bleeding from an injury or your nose that does not stop. ? Unusual colored urine (red or dark brown) or unusual colored stools (red or black). ? Unusual bruising for unknown reasons. ? A serious fall or if you hit your head (even if there is no bleeding).  Some foods or medicines interact with Coumadin (warfarin) and might alter your response to warfarin. To help avoid this: ? Eat a balanced diet, maintaining a consistent amount of Vitamin K. ? Notify your provider about major diet changes you plan to make. ? Avoid alcohol or limit your intake to 1 drink for women and 2 drinks for men per day. (1 drink is 5 oz. wine, 12 oz. beer, or 1.5 oz. liquor.)  Make sure that ANY health care provider who prescribes medication for you knows that you are taking Coumadin (warfarin).  Also make sure the healthcare provider who is monitoring your Coumadin knows when you have started a new medication including herbals and non-prescription products.  Coumadin (Warfarin)  Major Drug Interactions  Increased Warfarin Effect Decreased Warfarin Effect  Alcohol (large quantities) Antibiotics (esp. Septra/Bactrim, Flagyl, Cipro) Amiodarone (Cordarone) Aspirin (ASA) Cimetidine (Tagamet) Megestrol (Megace) NSAIDs (ibuprofen, naproxen, etc.) Piroxicam (Feldene) Propafenone (Rythmol SR) Propranolol (Inderal) Isoniazid (  INH) Posaconazole (Noxafil) Barbiturates (Phenobarbital) Carbamazepine (Tegretol) Chlordiazepoxide (Librium) Cholestyramine (Questran) Griseofulvin Oral Contraceptives Rifampin Sucralfate (Carafate) Vitamin K   Coumadin (Warfarin) Major Herbal Interactions  Increased Warfarin Effect Decreased Warfarin Effect  Garlic Ginseng Ginkgo biloba Coenzyme Q10 Green  tea St. Johns wort    Coumadin (Warfarin) FOOD Interactions  Eat a consistent number of servings per week of foods HIGH in Vitamin K (1 serving =  cup)  Collards (cooked, or boiled & drained) Kale (cooked, or boiled & drained) Mustard greens (cooked, or boiled & drained) Parsley *serving size only =  cup Spinach (cooked, or boiled & drained) Swiss chard (cooked, or boiled & drained) Turnip greens (cooked, or boiled & drained)  Eat a consistent number of servings per week of foods MEDIUM-HIGH in Vitamin K (1 serving = 1 cup)  Asparagus (cooked, or boiled & drained) Broccoli (cooked, boiled & drained, or raw & chopped) Brussel sprouts (cooked, or boiled & drained) *serving size only =  cup Lettuce, raw (green leaf, endive, romaine) Spinach, raw Turnip greens, raw & chopped   These websites have more information on Coumadin (warfarin):  FailFactory.se; VeganReport.com.au;   Mitral Valve Repair, Care After This sheet gives you information about how to care for yourself after your procedure. Your health care provider may also give you more specific instructions. If you have problems or questions, contact your health care provider. What can I expect after the procedure? After the procedure, it is common to have:  Pain at the incision area that may last for several weeks. Follow these instructions at home: Incision care   Follow instructions from your health care provider about how to take care of your incision. Make sure you:  Wash your hands with soap and water before you change your bandage (dressing). If soap and water are not available, use hand sanitizer.  Change your dressing as told by your health care provider.  Leave stitches (sutures), skin glue, or adhesive strips in place. These skin closures may need to stay in place for 2 weeks or longer. If adhesive strip edges start to loosen and curl up, you may trim the loose edges. Do not remove adhesive  strips completely unless your health care provider tells you to do that.  Check your incision area every day for signs of infection. Check for:  More redness, swelling, or pain.  More fluid or blood.  Warmth.  Pus or a bad smell.   Do not apply powder or lotion to the area. Driving   Do not drive until your health care provider approves.  Do not drive or use heavy machinery while taking prescription pain medicines. Bathing   Do not take baths, swim, or use a hot tub for 2-4 weeks after surgery, or until your health care provider approves. Ask your health care provider if you may take showers.  To wash the incision site, gently wash with soap and water and pat the area dry with a clean towel. Do not rub the incision area. That may cause bleeding. Activity   Rest as told by your health care provider. Ask your health care provider when you can resume normal activities, including sexual activity.  Avoid the following activities for 6-8 weeks, or as long as directed:  Lifting anything that is heavier than 10 lb (4.5 kg), or the limit that your health care provider tells you.  Pushing or pulling things with your arms.  Avoid climbing stairs and using the handrail to pull yourself up for the  first 2-3 weeks after surgery.  Avoid airplane travel for 4-6 weeks, or as long as directed.  Avoid sitting for long periods of time and crossing your legs. Get up and move around at least once every 1-2 hours.  If you are taking blood thinners (anticoagulants), avoid activities that have a high risk of injury. Ask your health care provider what activities are safe for you. Lifestyle   Limit alcohol intake to no more than 1 drink a day for nonpregnant women and 2 drinks a day for men. One drink equals 12 oz of beer, 5 oz of wine, or 1 oz of hard liquor.  Do not use any products that contain nicotine or tobacco, such as cigarettes and e-cigarettes. If you need help quitting, ask your health  care provider. General instructions   Take your temperature every day and weigh yourself every morning for the first 7 days after surgery. Write your temperatures and weight down and take this record with you to any follow-up visits.  Take over-the-counter and prescription medicines only as told by your health care provider.  To prevent or treat constipation while you are taking prescription pain medicine, your health care provider may recommend that you:  Drink enough fluid to keep your urine clear or pale yellow.  Take over-the-counter or prescription medicines.  Eat foods that are high in fiber, such as fresh fruits and vegetables, whole grains, and beans.  Limit foods that are high in fat and processed sugars, such as fried and sweet foods.  Follow instructions from your health care provider about eating or drinking restrictions.  Wear compression stockings for at least 2 weeks, or as long as told by your health care provider. These stockings help to prevent blood clots and reduce swelling in your legs. If your ankles are swollen after 2 weeks, continue to wear the stockings.  Keep all follow-up visits as told by your health care provider. This is important. Contact a health care provider if:  You develop a skin rash.  Your weight is increasing each day over 2-3 days.  You gain 2 lb (1 kg) or more in a single day.  You have a fever. Get help right away if:  You develop chest pain that feels different from the pain caused by your incision.  You develop shortness of breath or difficulty breathing.  You have more redness, swelling, or pain around your incision.  You have more fluid or blood coming from your incision.  Your incision feels warm to the touch.  You have pus or a bad smell coming from your incision.  You feel light-headed. This information is not intended to replace advice given to you by your health care provider. Make sure you discuss any questions you  have with your health care provider. Document Released: 10/27/2004 Document Revised: 01/20/2016 Document Reviewed: 01/20/2016 Elsevier Interactive Patient Education  2017 [employer].    No driving for 4 weeks Do not lift anything heavier than 5 lbs for 4 weeks Please wash your incision with soap and water and pat dry with a clean towel Shower daily but no baths or submerging your incision in any water You may ride in the back of a car with your heart pillow between you and the seatbelt for protection Ambulate several times a day as tolerated Continue to use your incentive spirometer Weigh yourself daily and at the same time each morning. If you gain 2 lbs in 24 hours, call our office so we can adjust  your diuretic regimen Feel free to call our office with any issues or concerns, [phone]

## 2016-07-08 ENCOUNTER — Inpatient Hospital Stay (HOSPITAL_COMMUNITY): Payer: Medicare Other

## 2016-07-08 LAB — CBC
HCT: 31.2 % — ABNORMAL LOW (ref 36.0–46.0)
Hemoglobin: 9.4 g/dL — ABNORMAL LOW (ref 12.0–15.0)
MCH: 29 pg (ref 26.0–34.0)
MCHC: 30.1 g/dL (ref 30.0–36.0)
MCV: 96.3 fL (ref 78.0–100.0)
PLATELETS: 162 10*3/uL (ref 150–400)
RBC: 3.24 MIL/uL — AB (ref 3.87–5.11)
RDW: 14 % (ref 11.5–15.5)
WBC: 13.3 10*3/uL — ABNORMAL HIGH (ref 4.0–10.5)

## 2016-07-08 LAB — BASIC METABOLIC PANEL
Anion gap: 4 — ABNORMAL LOW (ref 5–15)
BUN: 17 mg/dL (ref 6–20)
CALCIUM: 8.6 mg/dL — AB (ref 8.9–10.3)
CHLORIDE: 101 mmol/L (ref 101–111)
CO2: 30 mmol/L (ref 22–32)
CREATININE: 0.86 mg/dL (ref 0.44–1.00)
GFR calc non Af Amer: >60 mL/min (ref >60)
Glucose, Bld: 109 mg/dL — ABNORMAL HIGH (ref 65–99)
Potassium: 4.6 mmol/L (ref 3.5–5.1)
Sodium: 135 mmol/L (ref 135–145)

## 2016-07-08 LAB — PROTIME-INR
INR: 1.47
Prothrombin Time: 18 seconds — ABNORMAL HIGH (ref 11.4–15.2)

## 2016-07-08 LAB — GLUCOSE, CAPILLARY
Glucose-Capillary: 109 mg/dL — ABNORMAL HIGH (ref 65–99)
Glucose-Capillary: 97 mg/dL (ref 65–99)
Glucose-Capillary: 98 mg/dL (ref 65–99)
Glucose-Capillary: 110 mg/dL — ABNORMAL HIGH (ref 65–99)

## 2016-07-08 MED ORDER — WARFARIN VIDEO
Freq: Once | Status: AC
  Administered 2016-07-08: 13:00:00

## 2016-07-08 MED ORDER — LEVALBUTEROL TARTRATE 45 MCG/ACT IN AERO: 1.0000 | INHALATION_SPRAY | Freq: Four times a day (QID) | RESPIRATORY_TRACT | Status: DC | PRN

## 2016-07-08 MED ORDER — COUMADIN BOOK
Freq: Once | Status: AC
  Administered 2016-07-08: 13:00:00
  Filled 2016-07-08: qty 1

## 2016-07-08 NOTE — Anesthesia Procedure Notes (Signed)
Central Venous Catheter Insertion Performed by: Lillia Abed, anesthesiologist Start/End3/15/2018 7:00 AM, 07/05/2016 7:10 AM Preanesthetic checklist: patient identified, IV checked, risks and benefits discussed, surgical consent, monitors and equipment checked, pre-op evaluation, timeout performed and anesthesia consent Position: Trendelenburg Lidocaine 1% used for infiltration and patient sedated Hand hygiene performed  and maximum sterile barriers used  Central line and PA cath was placed.Sheath introducer Procedure performed using ultrasound guided technique. Ultrasound Notes:anatomy identified, needle tip was noted to be adjacent to the nerve/plexus identified, no ultrasound evidence of intravascular and/or intraneural injection and image(s) printed for medical record Attempts: 1 Following insertion, line sutured and dressing applied. Post procedure assessment: blood return through all ports, free fluid flow and no air  Patient tolerated the procedure well with no immediate complications.

## 2016-07-08 NOTE — Addendum Note (Signed)
Addendum  created 07/08/16 1659 by Lillia Abed, MD   Anesthesia Intra Blocks edited, Anesthesia Intra LDAs edited, Child order released for a procedure order, LDA created via procedure documentation, LDA properties accepted, Sign clinical note

## 2016-07-08 NOTE — Progress Notes (Addendum)
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 No other issues.   Objective: Vital signs in last 24 hours: Temp:  [97.9 F (36.6 C)-98.6 F (37 C)] 98.6 F (37 C) (03/18 0538) Pulse Rate:  [79-84] 80 (03/18 0538) Cardiac Rhythm: Junctional rhythm (03/18 0701) Resp:  [13-18] 18 (03/18 0538) BP: (110-129)/(65-80) 120/77 (03/18 0538) SpO2:  [93 %-100 %] 100 % (03/18 0538) Weight:  [68 kg (150 lb)] 68 kg (150 lb) (03/18 0538)    Intake/Output from previous day: 03/17 0701 - 03/18 0700 In: 250 [P.O.:240; I.V.:10] Out: 640 [Urine:350; Chest Tube:290] Intake/Output this shift: Total I/O In: -  Out: 260 [Chest Tube:260]  General appearance: alert, cooperative and no distress Heart: regular rate and rhythm, S1, S2 normal, no murmur, click, rub or gallop Lungs: CTA bilaterally with faint wheeze Abdomen: soft, non-tender; bowel sounds normal; no masses,  no organomegaly Extremities: extremities normal, atraumatic, no cyanosis or edema Wound: clean and dry  Lab Results:  Recent Labs  07/07/16 0500 07/08/16 0238  WBC 14.1* 13.3*  HGB 9.4* 9.4*  HCT 30.2* 31.2*  PLT 147* 162   BMET:  Recent Labs  07/07/16 0500 07/08/16 0238  NA 136 135  K 4.1 4.6  CL 104 101  CO2 27 30  GLUCOSE 134* 109*  BUN 17 17  CREATININE 1.01* 0.86  CALCIUM 8.5* 8.6*    PT/INR:  Recent Labs  07/08/16 0238  LABPROT 18.0*  INR 1.47   ABG    Component Value Date/Time   PHART 7.330 (L) 07/06/2016 0900   HCO3 24.9 07/06/2016 0900   TCO2 27 07/06/2016 1628   ACIDBASEDEF 1.0 07/06/2016 0900   O2SAT 97.0 07/06/2016 0900   CBG (last 3)   Recent Labs  07/07/16 1644 07/07/16 2042  07/08/16 0624  GLUCAP 153* 136* 109*    Assessment/Plan: S/P Procedure(s) (LRB): 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 CV- NSR in the 80s with good blood pressure. Continue Amio, BB, and coumadin 2. Pulm- tolerating 2L [place] with good oxygen saturation. CXR this morning showed a tiny right pneumothorax and mild chronic interstitial lung disease, and some subcutaneous emphysema on the right. Mild atelectasis with a stable small pleural effusion. Drainage is 244ml/24 hours-possibly discontinue. No air leak.  3. Renal-creatinine 0.86 and electrolytes okay. Continue PO Lasix 4. Endo-blood glucose well controlled 5. H and H stable and platelets trending up 6. Anticoagulation- on coumadin INR 1.47, continue  Plan: Patient is progressing well. Add Xopenex for wheezing PRN. Continue diuretics weight trending down. Possibly able to discontinue chest tubes today. Wean oxygen as tolerated. Ambulate TID. Encourage incentive spirometry and pulm toilet. Continue Coumadin.     LOS: 3 days    Elgie Collard 07/08/2016 Patient seen and examined, agree with above Not wheezing at present CT drained 290 1st shift yesterday, then nothing recorded until 260 early this AM. Has another 100 ml since pleuravac changed a little while ago. Would leave CT in for now Continue diuresis  Remo Lipps C. Roxan Hockey, MD [facility] [phone]

## 2016-07-09 LAB — PROTIME-INR
INR: 1.41
Prothrombin Time: 17.4 seconds — ABNORMAL HIGH (ref 11.4–15.2)

## 2016-07-09 LAB — GLUCOSE, CAPILLARY: GLUCOSE-CAPILLARY: 119 mg/dL — AB (ref 65–99)

## 2016-07-09 MED ORDER — LACTULOSE 10 GM/15ML PO SOLN
20.0000 g | Freq: Once | ORAL | Status: AC
  Administered 2016-07-09: 20 g via ORAL
  Filled 2016-07-09: qty 30

## 2016-07-09 MED ORDER — FUROSEMIDE 40 MG PO TABS
40.0000 mg | ORAL_TABLET | Freq: Two times a day (BID) | ORAL | Status: DC
  Administered 2016-07-10 – 2016-07-11 (×4): 40 mg via ORAL
  Filled 2016-07-09 (×4): qty 1

## 2016-07-09 MED ORDER — WARFARIN SODIUM 5 MG PO TABS
5.0000 mg | ORAL_TABLET | Freq: Every day | ORAL | Status: DC
  Administered 2016-07-09: 5 mg via ORAL
  Filled 2016-07-09: qty 1

## 2016-07-09 MED ORDER — FUROSEMIDE 10 MG/ML IJ SOLN
40.0000 mg | Freq: Two times a day (BID) | INTRAMUSCULAR | Status: AC
  Administered 2016-07-09 (×2): 40 mg via INTRAVENOUS
  Filled 2016-07-09 (×2): qty 4

## 2016-07-09 MED ORDER — POTASSIUM CHLORIDE CRYS ER 20 MEQ PO TBCR
20.0000 meq | EXTENDED_RELEASE_TABLET | Freq: Two times a day (BID) | ORAL | Status: DC
  Administered 2016-07-09 – 2016-07-11 (×6): 20 meq via ORAL
  Filled 2016-07-09 (×6): qty 1

## 2016-07-09 NOTE — Care Management Important Message (Signed)
Important Message  Patient Details  Name: [name] [name] MRN: [id number] Date of Birth: 01-08-1941   Medicare Important Message Given:  Yes    Nathen May 07/09/2016, 11:55 AM

## 2016-07-09 NOTE — Progress Notes (Signed)
Pt ambulated 200 ft with rolling walker, pt tolerated well.  Will continue to monitor.

## 2016-07-09 NOTE — Progress Notes (Signed)
EPWs pulled per protocol, no ectopy or other issues noted at this time.  Will continue to monitor.

## 2016-07-09 NOTE — Progress Notes (Signed)
CARDIAC REHAB PHASE I   PRE:  Rate/Rhythm: 93 JR    BP: sitting 116/65 right arm (left arm reading very high)    SaO2: 98 2L, 95 RA  MODE:  Ambulation: 150 ft   POST:  Rate/Rhythm: 103 JR    BP: sitting 131/72     SaO2: 89 RA, up to 93 RA with rest  Pt got out of bed independently. Used RW, unsteady, trouble steering, very slow pace. Pt reports no c/o, maybe just weakness. Asked her to increase pace and she was able to steer moderately better. Pt sts this was her first walk since surgery.  To recliner. SaO2 briefly low, up with rest. SaO2 95 RA in hall. Left O2 off and reported to RN. Encouraged x2 more walks today. Will f/u tomorrow. She is practicing IS, 750 mL.  [id number]  [place] Two, ACSM 07/09/2016 10:27 AM

## 2016-07-09 NOTE — Discharge Summary (Signed)
Physician Discharge Summary       [address]       [place] [address]             [phone]    Patient ID: [name] [name] MRN: [id number] DOB/AGE: 1940-04-30 76 y.o.  Admit date: 07/05/2016 Discharge date: 07/14/2016  Admission Diagnoses: 1. Severe mitral regurgitation 2. Long standing persistent atrial fibrillation  Active Diagnoses:  1. Left pneumothorax 2. Type II diabetes mellitus ([facility]) 3. Chronic diastolic congestive heart failure ([place]) 4. COPD (chronic obstructive pulmonary disease) ([facility]) 5. Colon cancer ([name])-status post low anterior resection, limited stage disease requiring no adjuvant therapy 6. GERD (gastroesophageal reflux disease) 7. Tricuspid regurgitation 8. Schatzki's ring 9. Incidental pulmonary nodule-8 mm vague opacity RML noted on CT scan 10. Hypercholesterolemia 11. History of kidney stones 12. Hiatal hernia 13. Hemorrhoids 14. DVT (deep venous thrombosis) ([name]) 15. DM (dermatomyositis) 16. ABL anemia 17. UTI  Procedure (s):   Minimally-Invasive Mitral Valve Repair             Complex valvuloplasty including artificial Gore-tex neochord placement x6             Sorin Memo 3D Ring Annuloplasty (size 43mm, catalog #[id number], serial #[id number])   Minimally-Invasive Maze Procedure             Complete bilateral atrial lesion set using cryothermy and bipolar radiofrequency ablation             Clipping of Left Atrial Appendage (Atricure Pro245, size 45 mm) by Dr. Roxy Manns on 07/05/2016.  Left chest tube placement by Dr. Roxy Manns on 07/05/2016  History of Presenting Illness: Patient is a 76 year old female with mitral regurgitation, atrial fibrillation on chronic anticoagulation, chronic diastolic congestive heart failure, COPD, hyperlipidemia, remote history of colon cancer, and more recent history of mild intermittent rectal bleeding attributed to hemorrhoids who has been referred for surgical consultation to discuss treatment options  for management of severe mitral regurgitation. The patient states that she has a long history of symptoms of exertional shortness breath dating back many years. She is a nonsmoker who has been diagnosed with COPD,although she was exposed to secondhand smoke by her husband who smokes a lot. Approximately 3 years ago she was noted to have an irregular heart rhythm and diagnosed with atrial fibrillation. She underwent an echocardiogram and was found to have mitral regurgitation. She was started on Xarelto and followed for several years by a cardiologist with [facility]. Her cardiologist moved away and she was referred to Dr. [name] in September 2017because of worsening symptoms of shortness of breath. At that time she was noted to be in rate controlled persistent atrial fibrillation. It is unclear how long it has been since she was in sinus rhythm, but she has never been treated with antiarrhythmic agents nor had DC cardioversion performed. Follow-up echocardiogram performed 01/25/2016 revealed normal left ventricular systolic function with an eccentric jet of mitral regurgitation that was reported as moderate to severe with associated severe left atrial enlargement and moderate to severe tricuspid regurgitation. She subsequently underwent TEE on 02/20/2016 that revealed mitral valve prolapse with a very eccentric jet of regurgitation and moderate to severe mitral regurgitation with normal left ventricular systolic function, ejection fraction estimated 60-65%, severe left atrial enlargement, and severe tricuspid regurgitation. Left and right heart catheterization was performed 04/12/2016 and revealed mild nonobstructive coronary artery disease with mild pulmonary hypertension and severe (4+) mitral regurgitation. The patient was referred for elective surgical consultation.  Patient returns to  the office today for follow-up of severe symptomatic primary mitral regurgitation, long-standing persistent atrial  fibrillation, tricuspid regurgitation, and chronic diastolic congestive heart failure. She was originally seen in consultation on 04/27/2016. She was last seen here in our office on 05/28/2016. She returns to the office today with tentative plans to proceed with elective surgery next week. She reports no new problems or complaints over the last few weeks. For the first time she is accompanied to the office today by her husband.  The patient is married and lives with her husband in [place], [place]. She has been retired for nearly 20 years, having previously worked in the [employer]. She lives a sedentary lifestyle. She states that she does not exercise at all. She complains of progressive symptoms of exertional shortness breath and fatigue. She has had some degree of shortness of breath for many years, and over the past 3-6 months her symptoms have gotten much worse. She now gets short of breath with mild activity and this limits her daily activities considerably. She denies any recent history of resting shortness of breath. She occasionally gets short of breath at night but typically she can sleep on one pillow. She has had some mild dizzy spells and lower extremity edema. She has not had any exertional chest pain but she does report some tightness across her chest when she is short of breath. She has intermittent dry cough and frequent wheezing. This is been attributed to history of asthma. She has mild arthritis but no problems with ambulation. She states that her physical activities are limited primarily by shortness of breath. She has not seen a dentist in at least 3 or 4 years. She denies any loose teeth or painful teeth at present. She does not have dentures. Dr. Roxy Manns again reviewed the indications, risks, and potential benefits of mitral valve repair, tricuspid valve repair, and Maze procedure with the patient and her husbandin the office today. Expectations for the patient's postoperative convalescence  at been discussed. The rationale for elective surgery has been explained, including a comparison between surgery and continued medical therapy with close follow-up. The likelihood of successful and durable valve repair has been discussed with particular reference to the findings of their recent echocardiogram. Based upon these findings and previous experience, Dr. Roxy Manns quoted them a greater than 90percent likelihood of successful valve repair. In the unlikely event that their valve cannot be successfully repaired, we discussed the possibility of replacing the mitral valve using a mechanical prosthesis with the attendant need for long-term anticoagulation versus the alternative of replacing it using a bioprosthetic tissue valve with its potential for late structural valve deterioration and failure, depending upon the patient's longevity. The patient specifically requests that if the mitral valve must be replaced that it be done using a bioprostheticvalve. The patient understands and accepts all potential risks of surgery. The patient has been instructed to stop taking Xarelto on Thursday, 06/28/2016. In addition, she has been given a new prescription for amiodarone to begin prior to surgery. She has been instructed to stop taking Zocor while she is taking amiodarone. On the morning of surgery, she has been instructed to take only Toprol-XL and Prilosec with a sip of water. She [facility] to [facility] in order to undergo a minimally invasive mitral valve repair and Maze procedure by Dr. Roxy Manns.   Brief Hospital Course:  The patient was extubated the evening of surgery without difficulty. He/she remained afebrile and hemodynamically stable. Gordy Councilman, a line,  and  foley were removed early in the post operative course. Patient was found to have a left pneumothorax and Dr. Roxy Manns placed a left chest tube on 03/15. Follow up chest x ray showed re expansion of left lung. Chest tubes remained for a several  days and then were removed  By 03/19. Epicardial pacing wires were removed prior to chest tubes. Lopressor was started and titrated accordingly. She was started on Coumadin. PT and INR were monitored daily. She is currently on Coumadin 2.5 mg. Her last INR was 2.46. She will need to have an INR drawn by [facility] on Monday with results faxed to Dr. [name]. She was volume over loaded and diuresed.She had ABL anemia. She did not require a post op transfusion. Last H and H was 9.6 and 30.6. He/she was weaned off the insulin drip. The patient's HGA1C pre op was 5.6. The patient was felt surgically stable for transfer from the ICU to [facility] for further convalescence on 07/07/2016. She continues to progress with cardiac rehab.She was ambulating on room air. She has been tolerating a diet and has had a bowel movement.  She became agitated on 03/22. Narcotics were stopped. Xanax was restarted because she takes this almost daily. She was given Haldol PRN and a UA was checked. She did have a large amount of leukocytes. She was put on Cipro. UC is still pending. Her mental status improved and she had no more agitation or hallucinations. Chest tube sutures will be removed in the office after discharge. The patient is felt surgically stable for discharge today.   Latest Vital Signs: Blood pressure 111/66, pulse 94, temperature 98.2 F (36.8 C), temperature source Oral, resp. rate 20, height 5\' 2"  (1.575 m), weight 69.8 kg (153 lb 14.4 oz), SpO2 94 %.  Physical Exam: Cardiovascular: RRR  Pulmonary: Diminished at bases R>L Abdomen: Soft, non tender, bowel sounds present. Extremities: Mild bilateral lower extremity edema. Wounds: Clean and dry.  No erythema or signs of infection.  Discharge Condition:Stable and discharged to home.  Recent laboratory studies:  Lab Results  Component Value Date   WBC 8.1 07/10/2016   HGB 9.6 (L) 07/10/2016   HCT 30.6 (L) 07/10/2016   MCV 94.2 07/10/2016   PLT 202 07/10/2016   Lab  Results  Component Value Date   NA 139 07/10/2016   K 4.2 07/10/2016   CL 98 (L) 07/10/2016   CO2 33 (H) 07/10/2016   CREATININE 0.68 07/10/2016   GLUCOSE 105 (H) 07/10/2016   Diagnostic Studies:  CLINICAL DATA:  Cardiac surgery 5 days ago, follow-up exam  EXAM: CHEST  2 VIEW  COMPARISON:  07/08/16  FINDINGS: Cardiac shadow remains enlarged. Postsurgical changes are again seen. The right-sided chest tubes have been removed in the interval. No recurrent pneumothorax is noted. Mild atelectatic changes are noted in the right mid lung. Left basilar atelectasis is noted slightly increased from the prior exam with small left effusion. No other focal abnormality is noted.  IMPRESSION: Increasing left basilar atelectasis and effusion.  Interval removal of right-sided thoracostomy catheters. No recurrent pneumothorax is seen.   Electronically Signed   By: Inez Catalina M.D.   On: 07/10/2016 08:28  Ct Chest Wo Contrast  Result Date: 07/03/2016 CLINICAL DATA:  History of 18 bowel, preop, shortness breath, also history of carcinoma of the colon EXAM: CT CHEST WITHOUT CONTRAST TECHNIQUE: Multidetector CT imaging of the chest was performed following the standard protocol without IV contrast. COMPARISON:  CT chest of 05/08/2016, and CT abdomen  pelvis of 05/15/2010 FINDINGS: Cardiovascular: Cardiomegaly is stable. No pericardial effusion is seen. Again there is enlargement of the left atrium which may have increased somewhat in the interval. No significant coronary artery calcifications are identified. The mid ascending thoracic aorta measures 34 mm in diameter. Mediastinum/Nodes: On this unenhanced study, no mediastinal or hilar adenopathy is seen. Only small mediastinal lymph nodes are present. A moderate size hiatal hernia again is noted. The thyroid gland is unremarkable. Lungs/Pleura: The nodule questioned in the right middle lobe previously is no longer seen. The 5 mm nodule  noncalcified within the right lower lobe is unchanged on image number 83 series 4. This has been stable since the images through the lung bases on CT abdomen pelvis of 05/15/2010, and therefore is considered a benign process. No pleural effusion is seen. Opacity has increased somewhat within the lingula with a slightly nodular component as on image 68 series 4. On that image the nodular area measures 10 mm in diameter. Since this nodular area does appear to have increased in the interval, continued follow-up is recommended. Upper Abdomen: No abnormality is seen within the upper abdomen. Musculoskeletal: The thoracic vertebrae are in normal alignment with degenerative changes in the lower thoracic spine. IMPRESSION: 1. Moderate cardiomegaly is present with some interval enlargement of the prominent left atrium as described previously. 2. Slightly more prominent nodular area within the lingula. Consider one of the following in 3 months for both low-risk and high-risk individuals: (a) repeat chest CT, (b) follow-up PET-CT, or (c) tissue sampling. This recommendation follows the consensus statement: Guidelines for Management of Incidental Pulmonary Nodules Detected on CT Images: From the Fleischner Society 2017; Radiology 2017; 284:228-243. 3. Stable noncalcified nodule in the right lower lobe posteriorly consistent with a benign process. 4. Vague nodule described previously in the right middle lobe is no longer seen. Electronically Signed   By: Ivar Drape M.D.   On: 07/03/2016 14:21  Discharge Instructions    Amb Referral to Cardiac Rehabilitation    Complete by:  As directed    Diagnosis:  Valve Repair   Valve:  Mitral Comment - min invasive , MAZE     Discharge Medications: Allergies as of 07/14/2016      Reactions   Iohexol Hives, Shortness Of Breath    Pt. had a severe allergic reaction to IV contrast the last time she was injected and had to be seen in the ER.    Hydrocodone-acetaminophen Hives    Nabumetone Rash   Prednisone Rash      Medication List    TAKE these medications   acetaminophen 325 MG tablet Commonly known as:  TYLENOL Take 650 mg by mouth every 6 (six) hours as needed for pain.   albuterol 108 (90 Base) MCG/ACT inhaler Commonly known as:  PROVENTIL HFA;VENTOLIN HFA Inhale 2 puffs into the lungs every 6 (six) hours as needed for wheezing.   ALPRAZolam 0.5 MG tablet Commonly known as:  XANAX Take 0.5 mg by mouth 2 (two) times daily as needed (for anxiety/sleep (scheduled at bedtime)).   amiodarone 200 MG tablet Commonly known as:  PACERONE Take 1 tablet (200 mg total) by mouth daily.   aspirin 81 MG EC tablet Take 1 tablet (81 mg total) by mouth daily.   calcium carbonate 600 MG Tabs tablet Commonly known as:  OS-CAL Take 600 mg by mouth 2 (two) times daily with a meal.   ciprofloxacin 500 MG tablet Commonly known as:  CIPRO Take 1 tablet (500  mg total) by mouth 2 (two) times daily. For 3 days then stop.   Coenzyme Q10 100 MG Tabs Take 100 mg by mouth every evening.   docusate sodium 100 MG capsule Commonly known as:  COLACE Take 200 mg by mouth daily as needed for mild constipation.   furosemide 40 MG tablet Commonly known as:  LASIX Take 1 tablet (40 mg total) by mouth daily.   hydrocortisone 2.5 % rectal cream Commonly known as:  ANUSOL-HC Place 1 application rectally 2 (two) times daily. FOR UP TO 10 DAYS AT A TIME What changed:  when to take this  reasons to take this  additional instructions   metoprolol succinate 25 MG 24 hr tablet Commonly known as:  TOPROL-XL Take 1 tablet (25 mg total) by mouth daily. What changed:  medication strength  how much to take   multivitamin tablet Take 1 tablet by mouth daily.   omeprazole 20 MG capsule Commonly known as:  PRILOSEC Take 1 capsule (20 mg total) by mouth daily.   PARoxetine 20 MG tablet Commonly known as:  PAXIL Take 20 mg by mouth daily.   potassium chloride SA 20 MEQ  tablet Commonly known as:  K-DUR,KLOR-CON Take 1 tablet (20 mEq total) by mouth daily.   sodium chloride 0.65 % Soln nasal spray Commonly known as:  OCEAN Place 1 spray into both nostrils as needed for congestion.   SYSTANE ULTRA 0.4-0.3 % Soln Generic drug:  Polyethyl Glycol-Propyl Glycol Place 1 drop into both eyes 3 (three) times daily.   traMADol 50 MG tablet Commonly known as:  ULTRAM Take 50 mg by mouth every 4-6 hours PRN severe pain. What changed:  how much to take  how to take this  when to take this  reasons to take this  additional instructions   warfarin 2.5 MG tablet Commonly known as:  COUMADIN Take 1 tablet (2.5 mg total) by mouth daily at 6 PM. Or as directed.      The patient has been discharged on:   1.Beta Blocker:  Yes [x   ]                              No   [   ]                              If No, reason:  2.Ace Inhibitor/ARB: Yes [   ]                                     No  [  x  ]                                     If No, reason:Labile BP  3.Statin:   Yes [   ]                  No  [ x  ]                  If No, reason: No CAD  4.Shela Commons:  Yes  [ x  ]                  No   [   ]  If No, reason:  Follow Up Appointments: Follow-up Information    Carlyle Dolly, MD. Go on 07/27/2016.   Specialty:  Cardiology Why:  @ 8:20am Contact information: [place] [address] [phone]        Rexene Alberts, MD Follow up on 07/23/2016.   Specialty:  Cardiothoracic Surgery Why:  PA/LAT CXR to be taken (at [facility] which is in the same building as Dr. Guy Sandifer office) on 07/23/2016 at 12:30 pm;Appointment time is at 1:00 pm Contact information: [address] [place] [address] [facility] Follow up.   Why:  HHRN/PT/OT arranged- please allow them 24-48 hr post discharge to call you and make arrangements for home visits.    Please draw an INR on Monday 07/16/2016 and phone results to Dr. Carlyle Dolly [phone]).  Contact information: [address] [place] [address] [phone]           Signed: Lars Pinks MPA-C 07/14/2016, 10:28 AM

## 2016-07-09 NOTE — Progress Notes (Signed)
Chest tubes removed per protocol, pt tolerated well.  Will continue to monitor.

## 2016-07-09 NOTE — Progress Notes (Addendum)
      [address]       [place] [address]             [phone]        4 Days Post-Op Procedure(s) (LRB): MINIMALLY INVASIVE MITRAL VALVE REPAIR (MVR) (Right) MINIMALLY INVASIVE MAZE PROCEDURE (N/A) TRANSESOPHAGEAL ECHOCARDIOGRAM (TEE) (N/A) CLIPPING OF ATRIAL APPENDAGE  Subjective: Patient has not had a bowel movement yet.  Objective: Vital signs in last 24 hours: Temp:  [98.4 F (36.9 C)-98.6 F (37 C)] 98.6 F (37 C) (03/19 0555) Pulse Rate:  [81-86] 85 (03/19 0555) Cardiac Rhythm: Normal sinus rhythm (03/19 0731) Resp:  [17-20] 18 (03/19 0555) BP: (115-123)/(53-60) 123/60 (03/19 0555) SpO2:  [99 %-100 %] 99 % (03/19 0555) FiO2 (%):  [28 %] 28 % (03/18 2029) Weight:  [76.7 kg (169 lb)] 76.7 kg (169 lb) (03/19 0555)  Pre op weight 71.9 kg Current Weight  07/09/16 76.7 kg (169 lb)      Intake/Output from previous day: 03/18 0701 - 03/19 0700 In: 3 [I.V.:3] Out: 440 [Chest Tube:440]   Physical Exam:  Cardiovascular: RRR  Pulmonary: Diminished at bases R>L Abdomen: Soft, non tender, bowel sounds present. Extremities: Mild bilateral lower extremity edema. Wounds: Clean and dry.  No erythema or signs of infection.  Lab Results: CBC: Recent Labs  07/07/16 0500 07/08/16 0238  WBC 14.1* 13.3*  HGB 9.4* 9.4*  HCT 30.2* 31.2*  PLT 147* 162   BMET:  Recent Labs  07/07/16 0500 07/08/16 0238  NA 136 135  K 4.1 4.6  CL 104 101  CO2 27 30  GLUCOSE 134* 109*  BUN 17 17  CREATININE 1.01* 0.86  CALCIUM 8.5* 8.6*    PT/INR:  Lab Results  Component Value Date   INR 1.41 07/09/2016   INR 1.47 07/08/2016   INR 1.41 07/07/2016   ABG:  INR: Will add last result for INR, ABG once components are confirmed Will add last 4 CBG results once components are confirmed  Assessment/Plan:  1. CV - A fib, junctional. SR in the 90's at time of exam. On Amiodarone 200 mg daily and Coumadin. INR slightly decreased from 1.47 to 1.41. Will  increase Coumadin to 5 mg. 2.  Pulmonary - Chest tubes with 440 cc last 24 hours. No CXR take today. Chest tubes to remain for now. On 2 liters of oxygen via . Wean to room air as tolerates. Encourage incentive spirometer. 3. Volume Overload - On Lasix 40 mg daily. 4.  Acute blood loss anemia - H and H yesterday stable at 9.4 and 31.2 5. CBGs 98/110/119. Pre op HGA1C 5.6. Stop accu checks and SS PRN. 6. LOC constipation  ZIMMERMAN,DONIELLE MPA-C 07/09/2016,7:39 AM   I have seen and examined the patient and agree with the assessment and plan as outlined.  Only approx 200 mL out from tubes since Pleur-evac changed yesterday morning.  D/C pacing wires and tubes today.  Mobilize.  Increase lasix.  Rexene Alberts, MD 07/09/2016 8:33 AM

## 2016-07-10 ENCOUNTER — Inpatient Hospital Stay (HOSPITAL_COMMUNITY): Payer: Medicare Other

## 2016-07-10 LAB — CBC
HEMATOCRIT: 30.6 % — AB (ref 36.0–46.0)
Hemoglobin: 9.6 g/dL — ABNORMAL LOW (ref 12.0–15.0)
MCH: 29.5 pg (ref 26.0–34.0)
MCHC: 31.4 g/dL (ref 30.0–36.0)
MCV: 94.2 fL (ref 78.0–100.0)
PLATELETS: 202 10*3/uL (ref 150–400)
RBC: 3.25 MIL/uL — ABNORMAL LOW (ref 3.87–5.11)
RDW: 13.6 % (ref 11.5–15.5)
WBC: 8.1 10*3/uL (ref 4.0–10.5)

## 2016-07-10 LAB — BASIC METABOLIC PANEL
Anion gap: 8 (ref 5–15)
BUN: 7 mg/dL (ref 6–20)
CALCIUM: 8.6 mg/dL — AB (ref 8.9–10.3)
CO2: 33 mmol/L — AB (ref 22–32)
CREATININE: 0.68 mg/dL (ref 0.44–1.00)
Chloride: 98 mmol/L — ABNORMAL LOW (ref 101–111)
GFR calc Af Amer: >60 mL/min (ref >60)
GLUCOSE: 105 mg/dL — AB (ref 65–99)
Potassium: 4.2 mmol/L (ref 3.5–5.1)
Sodium: 139 mmol/L (ref 135–145)

## 2016-07-10 LAB — PROTIME-INR
INR: 1.8
Prothrombin Time: 21.1 seconds — ABNORMAL HIGH (ref 11.4–15.2)

## 2016-07-10 MED ORDER — WARFARIN SODIUM 2.5 MG PO TABS
2.5000 mg | ORAL_TABLET | Freq: Every day | ORAL | Status: DC
  Administered 2016-07-10: 2.5 mg via ORAL
  Filled 2016-07-10: qty 1

## 2016-07-10 NOTE — Progress Notes (Addendum)
      [address]       [place] [address]             [phone]      5 Days Post-Op Procedure(s) (LRB): MINIMALLY INVASIVE MITRAL VALVE REPAIR (MVR) (Right) MINIMALLY INVASIVE MAZE PROCEDURE (N/A) TRANSESOPHAGEAL ECHOCARDIOGRAM (TEE) (N/A) CLIPPING OF ATRIAL APPENDAGE Subjective: She shares she has a headache this morning. No other issues.   Objective: Vital signs in last 24 hours: Temp:  [98.3 F (36.8 C)] 98.3 F (36.8 C) (03/20 0450) Pulse Rate:  [82-102] 91 (03/20 0450) Cardiac Rhythm: Junctional rhythm (03/19 1900) Resp:  [18] 18 (03/20 0450) BP: (114-132)/(60-70) 132/70 (03/20 0450) SpO2:  [95 %-100 %] 95 % (03/20 0450) Weight:  [73.2 kg (161 lb 6.4 oz)] 73.2 kg (161 lb 6.4 oz) (03/20 0450)  Hemodynamic parameters for last 24 hours:    Intake/Output from previous day: 03/19 0701 - 03/20 0700 In: 240 [P.O.:240] Out: -  Intake/Output this shift: No intake/output data recorded.  General appearance: alert, cooperative and no distress Heart: regular rate and rhythm, S1, S2 normal, no murmur, click, rub or gallop Lungs: clear to auscultation bilaterally Abdomen: soft, non-tender; bowel sounds normal; no masses,  no organomegaly Extremities: extremities normal, atraumatic, no cyanosis or edema Wound: clean and dry   Lab Results:  Recent Labs  07/08/16 0238 07/10/16 0256  WBC 13.3* 8.1  HGB 9.4* 9.6*  HCT 31.2* 30.6*  PLT 162 202   BMET:  Recent Labs  07/08/16 0238 07/10/16 0256  NA 135 139  K 4.6 4.2  CL 101 98*  CO2 30 33*  GLUCOSE 109* 105*  BUN 17 7  CREATININE 0.86 0.68  CALCIUM 8.6* 8.6*    PT/INR:  Recent Labs  07/10/16 0256  LABPROT 21.1*  INR 1.80   ABG    Component Value Date/Time   PHART 7.330 (L) 07/06/2016 0900   HCO3 24.9 07/06/2016 0900   TCO2 27 07/06/2016 1628   ACIDBASEDEF 1.0 07/06/2016 0900   O2SAT 97.0 07/06/2016 0900   CBG (last 3)   Recent Labs  07/08/16 1650 07/08/16 2043  07/09/16 0617  GLUCAP 98 110* 119*    Assessment/Plan: S/P Procedure(s) (LRB): 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 CV - A fib, junctional. SR in the 90's this morning. On Amiodarone 200 mg daily and Coumadin. INR increased to 1.80. Continue Coumadin 5mg .  2.  Pulmonary -Chest tubes discontinued yesterday. Tolerating room air with good oxygen saturation.  Encourage incentive spirometer. CXR this morning showed small bilateral pleural effusions and atelectasis. Await official read.  3. Volume Overload -increased yesterday to Lasix 40mg  BID.  4. Acute blood loss anemia - H and H stable 5. CBGs well controlled. Pre op HGA1C 5.6. Stop accu checks and SS PRN. 6. LOC constipation   LOS: 5 days    Elgie Collard 07/10/2016   I have seen and examined the patient and agree with the assessment and plan as outlined.  Overall making good progress although complains that she still has no appetite.  She is also concerned that she may not have adequate support at home and may need d/c to SNF for short term rehab.  She doesn't appear to need much assistance, but she should have 24 hr/day supervision   Rexene Alberts, MD 07/10/2016 8:13 AM

## 2016-07-10 NOTE — Progress Notes (Signed)
CARDIAC REHAB PHASE I   PRE:  Rate/Rhythm: 90 SR  BP:  Sitting: 134/74        SaO2: 97 RA  MODE:  Ambulation: 220 ft   POST:  Rate/Rhythm: 106 ST  BP:  Sitting: 143/65         SaO2: 100 RA  Pt c/o headache this morning, agreeable to walk. Pt ambulated 220 ft on RA, rolling walker, hand held assist, slow, steady gait, tolerated well with no complaints, however, pt declined to ambulate farther. Encouraged IS, additional ambulation x2 today. Pt to recliner after walk, feet elevated, call bell within reach. Will follow.   [phone] Lenna Sciara, RN, BSN 07/10/2016 8:50 AM

## 2016-07-10 NOTE — Evaluation (Signed)
Physical Therapy Evaluation Patient Details Name: [name] [name] MRN: [id number] DOB: [birth year] Today's Date: 07/10/2016   History of Present Illness  76 yo admitted s/p mini MVR. PMHx: Afib, mitral regurgitation, HF, COPD, HLD, colon CA  Clinical Impression  Pt pleasant and moving well with ability to perform transfers and gait without physical assist. Pt with spouse at home and son who works 2days on/2days off who can also assist at D/C. Pt hesitant with gait initially but able to increase distance with use of RW. Pt with decreased balance, activity tolerance and gait who will benefit from acute therapy to maximize mobility and function to return pt to independent level.       Follow Up Recommendations Home health PT (only if pt does not achieve mod I acutely)    Equipment Recommendations  Rolling walker with 5" wheels    Recommendations for Other Services       Precautions / Restrictions Precautions Precautions: Fall      Mobility  Bed Mobility               General bed mobility comments: in chair on arrival  Transfers Overall transfer level: Modified independent                  Ambulation/Gait Ambulation/Gait assistance: Supervision Ambulation Distance (Feet): 450 Feet Assistive device: Rolling walker (2 wheeled) Gait Pattern/deviations: Step-through pattern;Decreased stride length;Trunk flexed   Gait velocity interpretation: Below normal speed for age/gender General Gait Details: cues for posture and position in RW. Attempted without RW with pt with decreased gait speed, very cautious and reaching out for railing   Stairs Stairs: Yes Stairs assistance: Modified independent (Device/Increase time) Stair Management: One rail Right;Alternating pattern;Forwards Number of Stairs: 2    Wheelchair Mobility    Modified Rankin (Stroke Patients Only)       Balance Overall balance assessment: Needs assistance   Sitting balance-Leahy Scale:  Good       Standing balance-Leahy Scale: Fair                               Pertinent Vitals/Pain Pain Assessment: 0-10 Pain Score: 4  Pain Location: head Pain Descriptors / Indicators: Aching Pain Intervention(s): Limited activity within patient's tolerance;Monitored during session    [place] expects to be discharged to:: Private residence Living Arrangements: Spouse/significant other Available Help at Discharge: Family;Available 24 hours/day Type of Home: House Home Access: Stairs to enter Entrance Stairs-Rails: Right Entrance Stairs-Number of Steps: 2 Home Layout: One level Home Equipment: None      Prior Function Level of Independence: Independent               Hand Dominance        Extremity/Trunk Assessment   Upper Extremity Assessment Upper Extremity Assessment: Overall WFL for tasks assessed    Lower Extremity Assessment Lower Extremity Assessment: Overall WFL for tasks assessed    Cervical / Trunk Assessment Cervical / Trunk Assessment: Normal  Communication   Communication: No difficulties  Cognition Arousal/Alertness: Awake/alert Behavior During Therapy: WFL for tasks assessed/performed Overall Cognitive Status: Within Functional Limits for tasks assessed                      General Comments      Exercises     Assessment/Plan    PT Assessment Patient needs continued PT services  PT Problem List Decreased mobility;Decreased activity tolerance;Decreased balance;Decreased  knowledge of use of DME       PT Treatment Interventions Gait training;Therapeutic exercise;Stair training;Functional mobility training;Balance training;Patient/family education;DME instruction;Therapeutic activities    PT Goals (Current goals can be found in the Care Plan section)  Acute Rehab PT Goals Patient Stated Goal: return home PT Goal Formulation: With patient Time For Goal Achievement: 07/24/16 Potential to  Achieve Goals: Good    Frequency Min 3X/week   Barriers to discharge        Co-evaluation               End of Session Equipment Utilized During Treatment: Gait belt Activity Tolerance: Patient tolerated treatment well Patient left: in chair;with call bell/phone within reach Nurse Communication: Mobility status PT Visit Diagnosis: Difficulty in walking, not elsewhere classified (R26.2)         Time: 2458-0998 PT Time Calculation (min) (ACUTE ONLY): 19 min   Charges:   PT Evaluation $PT Eval Moderate Complexity: 1 Procedure     PT G Codes:         Felicia Bloomquist B Kedron Uno 17-Jul-2016, 1:28 PM Elwyn Reach, [place]

## 2016-07-10 NOTE — Care Management Note (Signed)
Case Management Note [name] RN, BSN Unit 2W-Case Manager [phone]  Patient Details  Name: [name] [name] MRN: [id number] Date of Birth: 24-May-1940  Subjective/Objective:  Pt admitted s/p MVR/MAZE                 Action/Plan: PTA pt lived at home with spouse- referral received for possible SNF placement- CSW consulted to assess- PT eval pending  Expected Discharge Date:                  Expected Discharge Plan:  [name]  In-House Referral:  Clinical Social Work  Discharge planning Services  CM Consult  Post Acute Care Choice:  Home Health Choice offered to:  Patient  DME Arranged:    DME Agency:     HH Arranged:    [place] Agency:     Status of Service:  In process, will continue to follow  If discussed at Long Length of Stay Meetings, dates discussed:    Discharge Disposition:   Additional Comments:  07/10/16- 1600- [name] RN, CM- per PT eval - recommendations for [facility] therapy- CSW has spoken with pt as pt does not qualify for STSNF- will need HH orders for discharge - CM will f/u with pt for [place] [facility] needs prior to discharge- attempted to see pt but pt was resting.   [name], RN 07/10/2016, 4:00 PM

## 2016-07-11 LAB — PROTIME-INR
INR: 3.55
PROTHROMBIN TIME: 36.4 s — AB (ref 11.4–15.2)

## 2016-07-11 MED ORDER — METOPROLOL SUCCINATE ER 25 MG PO TB24
25.0000 mg | ORAL_TABLET | Freq: Every day | ORAL | Status: DC
  Administered 2016-07-11 – 2016-07-14 (×4): 25 mg via ORAL
  Filled 2016-07-11 (×4): qty 1

## 2016-07-11 NOTE — Progress Notes (Signed)
Patient was in bed sleeping, suddenly started yelling as if was seeing things. Claiming there is someone else in the room, MD on call notified. The order is to keep monitoring the patient. Bed alarm on. Will continue to monitor.

## 2016-07-11 NOTE — Progress Notes (Signed)
CARDIAC REHAB PHASE I   PRE:  Rate/Rhythm: 97 SR  BP:  Supine:   Sitting:   Standing: unable to check bp, up to bathroom, and immediately walked in hall   SaO2: 96% RA  MODE:  Ambulation: 350 ft   POST:  Rate/Rhythm: 104 ST  BP:  Supine:   Sitting: 126/70  Standing:    SaO2: 95% RA Tolerated ambulation well with rolling walker and assistance x 1.  Reports seeing " little people on the walls" in the bathroom, reported to nurse.  Otherwise oriented x 3, cooperative.  Instructed to walk with nursing staff 2 more times today. Arabi RN, BSN 07/11/2016 12:17 PM

## 2016-07-11 NOTE — Progress Notes (Addendum)
      [address]       [place] [address]             [phone]      6 Days Post-Op Procedure(s) (LRB): MINIMALLY INVASIVE MITRAL VALVE REPAIR (MVR) (Right) MINIMALLY INVASIVE MAZE PROCEDURE (N/A) TRANSESOPHAGEAL ECHOCARDIOGRAM (TEE) (N/A) CLIPPING OF ATRIAL APPENDAGE Subjective: Feels hot this morning. No issues overnight.   Objective: Vital signs in last 24 hours: Temp:  [98.4 F (36.9 C)-98.5 F (36.9 C)] 98.4 F (36.9 C) (03/21 0532) Pulse Rate:  [95-112] 95 (03/21 0532) Cardiac Rhythm: Atrial fibrillation (03/21 0700) Resp:  [18] 18 (03/21 0532) BP: (123-137)/(54-72) 137/72 (03/21 0532) SpO2:  [96 %-98 %] 96 % (03/21 0532) Weight:  [71.7 kg (158 lb 1.6 oz)] 71.7 kg (158 lb 1.6 oz) (03/21 0532)     Intake/Output from previous day: No intake/output data recorded. Intake/Output this shift: No intake/output data recorded.  General appearance: alert, cooperative and no distress Heart: rate controlled atrial fibrillation Lungs: clear to auscultation bilaterally Abdomen: soft, non-tender; bowel sounds normal; no masses,  no organomegaly Extremities: extremities normal, atraumatic, no cyanosis or edema Wound: clean and dry. Some draiange from chest tube site  Lab Results:  Recent Labs  07/10/16 0256  WBC 8.1  HGB 9.6*  HCT 30.6*  PLT 202   BMET:  Recent Labs  07/10/16 0256  NA 139  K 4.2  CL 98*  CO2 33*  GLUCOSE 105*  BUN 7  CREATININE 0.68  CALCIUM 8.6*    PT/INR:  Recent Labs  07/11/16 0507  LABPROT 36.4*  INR 3.55   ABG    Component Value Date/Time   PHART 7.330 (L) 07/06/2016 0900   HCO3 24.9 07/06/2016 0900   TCO2 27 07/06/2016 1628   ACIDBASEDEF 1.0 07/06/2016 0900   O2SAT 97.0 07/06/2016 0900   CBG (last 3)   Recent Labs  07/08/16 1650 07/08/16 2043 07/09/16 0617  GLUCAP 98 110* 119*    Assessment/Plan: S/P Procedure(s) (LRB): MINIMALLY INVASIVE MITRAL VALVE REPAIR (MVR) (Right) MINIMALLY INVASIVE  MAZE PROCEDURE (N/A) TRANSESOPHAGEAL ECHOCARDIOGRAM (TEE) (N/A) CLIPPING OF ATRIAL APPENDAGE   1. CV - A fib, junctional. Atrial fibrillation, rate controlled in the 90's-100's this morning. On Amiodarone 200 mg daily and Coumadin. INR now 3.55. Holding Coumadin today. BP well controlled.  2. Pulmonary - Tolerating room air with good oxygen saturation.  Encourage incentive spirometer.  3. Volume Overload -increased yesterday to Lasix 40mg  BID. Weight continues to trend down.  4. Acute blood loss anemia - H and H stable 5. CBGs well controlled. Pre op HGA1C 5.6. Stop accu checks and SS PRN. 6. LOC constipation  Plan: INR supratherapeutic, holding coumadin today. Patient does not qualify for SNF. I am happy to arrange home health, however there needs to be someone at home 24/7 to look after her. Discussed with the patient.    LOS: 6 days    Elgie Collard 07/11/2016   I have seen and examined the patient and agree with the assessment and plan as outlined.  HR slowly trending up.  Will restart Toprol at reduced dose.  Possible d/c 1-2 days.  Rexene Alberts, MD 07/11/2016 3:51 PM

## 2016-07-11 NOTE — Care Management Note (Addendum)
Case Management Note [name] RN, BSN Unit 2W-Case Manager [phone]  Patient Details  Name: [name] MRN: [id number] Date of Birth: 11-08-1940  Subjective/Objective:  Pt admitted s/p MVR/MAZE                 Action/Plan: PTA pt lived at home with spouse- referral received for possible SNF placement- CSW consulted to assess- PT eval pending  Expected Discharge Date:     07/12/16             Expected Discharge Plan:  [place]  In-House Referral:  Clinical Social Work  Discharge planning Services  CM Consult  Post Acute Care Choice:  Home Health, Durable Medical Equipment Choice offered to:  Patient  DME Arranged:  Walker rolling DME Agency:  [place] Arranged:  PT, RN [facility] Agency:  [place]  Status of Service:  Completed, signed off  If discussed at [place] of Stay Meetings, dates discussed:  3/20, 3/22  Discharge Disposition: home w/ home health   Additional Comments:  07/11/16- 1100- [name] RN, CM- referral for [facility] services- spoke with pt at bedside- pt states that her husband will be available 24/7 at home- pt agreeable to [facility]- order placed for [facility]- pt also would benefit from [place] per PT recommendations- will ask MD to add PT to order- choice offered to pt for [facility] agency in [facility]- per pt choice she would like to use [facility] for services- pt also states she needs RW for home- will ask for DME order and have [place] arrange to deliver to room prior to discharge once order placed. Referral for [facility] services called to Santiago Glad with [facility].   07/10/16- 1600- [name] RN, CM- per PT eval - recommendations for [facility] therapy- CSW has spoken with pt as pt does not qualify for STSNF- will need HH orders for discharge - CM will f/u with pt for [facility] needs prior to discharge- attempted to see pt but pt was resting.   [name], RN 07/11/2016, 10:59 AM

## 2016-07-12 LAB — BPAM RBC
BLOOD PRODUCT EXPIRATION DATE: 201804072359
Blood Product Expiration Date: 201804062359
Blood Product Expiration Date: 201804072359
Blood Product Expiration Date: 201804072359
ISSUE DATE / TIME: 201803150817
ISSUE DATE / TIME: 201803201335
ISSUE DATE / TIME: 201803150817
ISSUE DATE / TIME: 201803201840
UNIT TYPE AND RH: 5100
Unit Type and Rh: 5100
Unit Type and Rh: 5100
Unit Type and Rh: 5100

## 2016-07-12 LAB — URINALYSIS, ROUTINE W REFLEX MICROSCOPIC
BILIRUBIN URINE: NEGATIVE
Glucose, UA: NEGATIVE mg/dL
Ketones, ur: NEGATIVE mg/dL
NITRITE: NEGATIVE
PH: 5 (ref 5.0–8.0)
Protein, ur: 30 mg/dL — AB
SPECIFIC GRAVITY, URINE: 1.021 (ref 1.005–1.030)

## 2016-07-12 LAB — TYPE AND SCREEN
ABO/RH(D): O POS
ANTIBODY SCREEN: NEGATIVE
UNIT DIVISION: 0
UNIT DIVISION: 0
Unit division: 0
Unit division: 0

## 2016-07-12 LAB — PROTIME-INR
INR: 2.59
Prothrombin Time: 28.3 seconds — ABNORMAL HIGH (ref 11.4–15.2)

## 2016-07-12 MED ORDER — HALOPERIDOL LACTATE 5 MG/ML IJ SOLN
1.0000 mg | INTRAMUSCULAR | Status: DC | PRN
  Administered 2016-07-12 – 2016-07-13 (×2): 2 mg via INTRAVENOUS
  Filled 2016-07-12 (×2): qty 1

## 2016-07-12 MED ORDER — WARFARIN SODIUM 2.5 MG PO TABS
2.5000 mg | ORAL_TABLET | Freq: Every day | ORAL | Status: DC
  Administered 2016-07-12 – 2016-07-13 (×2): 2.5 mg via ORAL
  Filled 2016-07-12 (×2): qty 1

## 2016-07-12 MED ORDER — POTASSIUM CHLORIDE CRYS ER 20 MEQ PO TBCR
20.0000 meq | EXTENDED_RELEASE_TABLET | Freq: Every day | ORAL | Status: DC
  Administered 2016-07-12 – 2016-07-14 (×3): 20 meq via ORAL
  Filled 2016-07-12 (×3): qty 1

## 2016-07-12 MED ORDER — CIPROFLOXACIN HCL 500 MG PO TABS
500.0000 mg | ORAL_TABLET | Freq: Two times a day (BID) | ORAL | Status: DC
  Administered 2016-07-12 – 2016-07-14 (×4): 500 mg via ORAL
  Filled 2016-07-12 (×4): qty 1

## 2016-07-12 MED ORDER — FUROSEMIDE 40 MG PO TABS
40.0000 mg | ORAL_TABLET | Freq: Every day | ORAL | Status: DC
  Administered 2016-07-12 – 2016-07-14 (×3): 40 mg via ORAL
  Filled 2016-07-12 (×3): qty 1

## 2016-07-12 MED ORDER — ALPRAZOLAM 0.5 MG PO TABS
0.5000 mg | ORAL_TABLET | Freq: Two times a day (BID) | ORAL | Status: DC | PRN
  Administered 2016-07-13: 0.5 mg via ORAL
  Filled 2016-07-12: qty 1

## 2016-07-12 NOTE — Care Management Important Message (Signed)
Important Message  Patient Details  Name: [name] [name] MRN: [id number] Date of Birth: 1940-11-03   Medicare Important Message Given:  Yes    Nathen May 07/12/2016, 12:51 PM

## 2016-07-12 NOTE — Progress Notes (Signed)
CARDIAC REHAB PHASE I   PRE:  Rate/Rhythm: 93 SR  BP:  Sitting: 106/62        SaO2: 97 RA  MODE:  Ambulation: 550 ft   POST:  Rate/Rhythm: 95 SR  BP:  Sitting: 125/66         SaO2: 98 RA  Pt pleasantly confused, oriented to month and year but not to the day. Pt reports seeing people in her room, seeing bugs per nursing staff. Pt ambulated 550 ft on RA, rolling walker, assist x1, slow, steady gait, tolerated well with no complaints, however, pt did have some difficulty steering RW in the hallway, ran into several objects, had difficulty staying inside the walker when making turns. Pt to recliner after walk, chair alarm on, feet elevated, call bell within reach. Will follow.   [phone] Lenna Sciara, RN, BSN 07/12/2016 2:21 PM

## 2016-07-12 NOTE — Progress Notes (Addendum)
[name] [name]       [name] [name],[name] [address]             [phone]      7 Days Post-Op Procedure(s) (LRB): MINIMALLY INVASIVE MITRAL VALVE REPAIR (MVR) (Right) MINIMALLY INVASIVE MAZE PROCEDURE (N/A) TRANSESOPHAGEAL ECHOCARDIOGRAM (TEE) (N/A) CLIPPING OF ATRIAL APPENDAGE Subjective: Shares that there was water all over her room this morning, but someone must have cleaned it up since there was none when she came back from her walk. She otherwise feels good.   Objective: Vital signs in last 24 hours: Temp:  [98.2 F (36.8 C)-98.7 F (37.1 C)] 98.7 F (37.1 C) (03/21 2015) Pulse Rate:  [57-113] 96 (03/21 2015) Cardiac Rhythm: Atrial fibrillation (03/21 1900) Resp:  [18] 18 (03/21 2015) BP: (121-129)/(58-60) 121/60 (03/21 2015) SpO2:  [95 %-99 %] 95 % (03/21 2015) Weight:  [71 kg (156 lb 8.4 oz)] 71 kg (156 lb 8.4 oz) (03/22 0500)     Intake/Output from previous day: No intake/output data recorded. Intake/Output this shift: No intake/output data recorded.  General appearance: cooperative and no distress Heart: regular rate and rhythm, S1, S2 normal, no murmur, click, rub or gallop Lungs: clear to auscultation bilaterally Abdomen: soft, non-tender; bowel sounds normal; no masses,  no organomegaly Extremities: extremities normal, atraumatic, no cyanosis or edema Wound: clean and dry  Lab Results:  Recent Labs  07/10/16 0256  WBC 8.1  HGB 9.6*  HCT 30.6*  PLT 202   BMET:  Recent Labs  07/10/16 0256  NA 139  K 4.2  CL 98*  CO2 33*  GLUCOSE 105*  BUN 7  CREATININE 0.68  CALCIUM 8.6*    PT/INR:  Recent Labs  07/12/16 0244  LABPROT 28.3*  INR 2.59   ABG    Component Value Date/Time   PHART 7.330 (L) 07/06/2016 0900   HCO3 24.9 07/06/2016 0900   TCO2 27 07/06/2016 1628   ACIDBASEDEF 1.0 07/06/2016 0900   O2SAT 97.0 07/06/2016 0900   CBG (last 3)  No results for input(s): GLUCAP in the last 72 hours.  Assessment/Plan: S/P  Procedure(s) (LRB): 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 CV - A fib, junctional. NSR in the 90s this morning. On Amiodarone 200 mg daily and Coumadin. Toprol started yesterday.  INR now 2.59. Holding Coumadin on more day. BP well controlled.  2. Pulmonary - Tolerating room air with good oxygen saturation. Encourage incentive spirometer.  3. Volume Overload -decreased to Lasix 40mg  daily since she is below her baseline weight.  4. Acute blood loss anemia - H and H stable 5. CBGs well controlled. Pre op HGA1C 5.6. Stop accu checks and SS PRN. 6. LOC constipation 7. Hallucinations- Will discontinue Oxycodone and continue Tylenol and Ultram for pain control.   Plan: INR supratherapeutic, holding coumadin one more day. Home health arranged as well as home PT/OT. Husband can be home with her but he has the flu.  Discontinue Oxycodone and monitor patient closely.    LOS: 7 days    Elgie Collard 07/12/2016   I have seen and examined the patient and agree with the assessment and plan as outlined.  Some confusion and hallucinations overnight and today.  Alert and oriented x2 but doesn't know what day it is.  Seems aware that she has been confused.  Denies pain or SOB.  Ambulating quite a bit without difficulty.  Will resume Coumadin tonight and recheck INR tomorrow.  Stop Oxycodone and Xanax.  Rexene Alberts, MD 07/12/2016 2:16 PM

## 2016-07-12 NOTE — Progress Notes (Signed)
Contacted by patients nurse in regards to patient being severely agitated.  She also made several attempts to hit and punch staff.  This is acute in onset which originally started last night.  All narcotic pain medications had been discontinued.  Plan:  1.?UTI, urine collected... Dipstick + for leukocytes, will get culture... Start Cipro 2. Agitation/aggressive behavior towards staff- continue home Xanax, will add Haldol 1-2 mg q 2 prn severe agitation

## 2016-07-12 NOTE — Progress Notes (Signed)
Physical Therapy Treatment Patient Details Name: [name] [name] MRN: [id number] DOB: 1940/05/07 Today's Date: 07/12/2016    History of Present Illness 76 yo admitted s/p mini MVR. Pt with hallucinations starting 3/21. PMHx: Afib, mitral regurgitation, HF, COPD, HLD, colon CA    PT Comments    Pt with drastically different cognitive status today. Pt is hallucinating about water in room and children present. Pt is aware of place, time, situation and president but states that her husband has the flu (no mention last session) and that she sometimes gets lost driving. Pt mobility remains only slightly limited but cognition impacts safety and physical function. Pt will certainly require close supervision 24hrs /day, family not present to state assist and if pt cognition does not return to baseline change in D/C disposition may be warranted. Will continue to follow. Pt in room with PA and RN end of session.  HR 100-103   Follow Up Recommendations  Supervision/Assistance - 24 hour;SNF;Home health PT (pending family ability to provide 24hr assist and pt cognitive status)     Equipment Recommendations  None recommended by PT    Recommendations for Other Services       Precautions / Restrictions Precautions Precautions: Fall    Mobility  Bed Mobility               General bed mobility comments: in chair on arrival  Transfers Overall transfer level: Modified independent                  Ambulation/Gait Ambulation/Gait assistance: Supervision Ambulation Distance (Feet): 550 Feet Assistive device: None Gait Pattern/deviations: WFL(Within Functional Limits)   Gait velocity interpretation: at or above normal speed for age/gender General Gait Details: pt with one partial LOB and able to recover on her own, supervision for safety and cueing as pt unable to recall room number or return to room   Stairs            Wheelchair Mobility    Modified Rankin (Stroke  Patients Only)       Balance Overall balance assessment: Needs assistance   Sitting balance-Leahy Scale: Normal       Standing balance-Leahy Scale: Good                      Cognition Arousal/Alertness: Awake/alert Behavior During Therapy: WFL for tasks assessed/performed Overall Cognitive Status: Impaired/Different from baseline Area of Impairment: Safety/judgement         Safety/Judgement: Decreased awareness of safety     General Comments: pt hallucinating that there is water in her drawer and all around room. pt states this happens due to the "kids with the candy"    Exercises      General Comments        Pertinent Vitals/Pain Pain Assessment: No/denies pain    Home Living                      Prior Function            PT Goals (current goals can now be found in the care plan section) Progress towards PT goals: Progressing toward goals    Frequency           PT Plan Current plan remains appropriate    Co-evaluation             End of Session Equipment Utilized During Treatment: Gait belt Activity Tolerance: Patient tolerated treatment well Patient left: in chair;with call bell/phone within  reach;with chair alarm set;with nursing/sitter in room Nurse Communication: Mobility status PT Visit Diagnosis: Other abnormalities of gait and mobility (R26.89)     Time: 1660-6004 PT Time Calculation (min) (ACUTE ONLY): 12 min  Charges:  $Gait Training: 8-22 mins                    G Codes:       Marqueta Pulley B Ether Wolters July 13, 2016, 8:46 AM  Elwyn Reach, [place]

## 2016-07-13 ENCOUNTER — Other Ambulatory Visit: Payer: Self-pay | Admitting: Thoracic Surgery (Cardiothoracic Vascular Surgery)

## 2016-07-13 DIAGNOSIS — Z9889 Other specified postprocedural states: Secondary | ICD-10-CM

## 2016-07-13 LAB — PROTIME-INR
INR: 2.78
PROTHROMBIN TIME: 29.9 s — AB (ref 11.4–15.2)

## 2016-07-13 MED ORDER — POLYETHYLENE GLYCOL 3350 17 G PO PACK: 17.0000 g | PACK | Freq: Every day | ORAL | Status: DC | PRN

## 2016-07-13 NOTE — Progress Notes (Signed)
CARDIAC REHAB PHASE I   PRE:  Rate/Rhythm: 88 SR  BP:  Sitting: 112/68        SaO2: 95 RA  MODE:  Ambulation: 690 ft   POST:  Rate/Rhythm: 96 SR  BP:  Sitting: 105/61         SaO2: 95 RA  Pt alert and oriented x3 this morning, pleasant. Pt ambulated 690 ft on RA, rolling walker, hand held assist, steady gait, tolerated well with no complaints. Reviewed post-op education with pt including IS, restrictions, activity progression, exercise, heart healty diet, daily weights and phase 2 cardiac rehab. Pt verbalized understanding. Pt agrees to phase 2 cardiac rehab referral, will send to [facility] per pt request. Pt to recliner after walk, feet elevated, call bell within reach. PT to see later this morning. Will follow.   [id number] Lenna Sciara, RN, BSN 07/13/2016 9:46 AM

## 2016-07-13 NOTE — Progress Notes (Addendum)
      [address]       [place] [address]             [phone]      8 Days Post-Op Procedure(s) (LRB): MINIMALLY INVASIVE MITRAL VALVE REPAIR (MVR) (Right) MINIMALLY INVASIVE MAZE PROCEDURE (N/A) TRANSESOPHAGEAL ECHOCARDIOGRAM (TEE) (N/A) CLIPPING OF ATRIAL APPENDAGE Subjective: Events from last night noted. Calm and more alert this morning. States she has not had any halluinations this morning.   Objective: Vital signs in last 24 hours: Temp:  [98.6 F (37 C)-99.3 F (37.4 C)] 98.6 F (37 C) (03/23 0609) Pulse Rate:  [86-103] 86 (03/23 0609) Cardiac Rhythm: Atrial fibrillation (03/22 1900) Resp:  [18] 18 (03/23 0609) BP: (96-124)/(50-95) 114/58 (03/23 0609) SpO2:  [95 %-99 %] 95 % (03/23 0609)     Intake/Output from previous day: No intake/output data recorded. Intake/Output this shift: No intake/output data recorded.  General appearance: cooperative and no distress Heart: regular rate and rhythm, S1, S2 normal, no murmur, click, rub or gallop Lungs: clear to auscultation bilaterally Abdomen: soft, non-tender; bowel sounds normal; no masses,  no organomegaly Extremities: extremities normal, atraumatic, no cyanosis or edema Wound: clean and dry  Lab Results: No results for input(s): WBC, HGB, HCT, PLT in the last 72 hours. BMET: No results for input(s): NA, K, CL, CO2, GLUCOSE, BUN, CREATININE, CALCIUM in the last 72 hours.  PT/INR:  Recent Labs  07/13/16 0218  LABPROT 29.9*  INR 2.78   ABG    Component Value Date/Time   PHART 7.330 (L) 07/06/2016 0900   HCO3 24.9 07/06/2016 0900   TCO2 27 07/06/2016 1628   ACIDBASEDEF 1.0 07/06/2016 0900   O2SAT 97.0 07/06/2016 0900   CBG (last 3)  No results for input(s): GLUCAP in the last 72 hours.  Assessment/Plan: S/P Procedure(s) (LRB): MINIMALLY INVASIVE MITRAL VALVE REPAIR (MVR) (Right) MINIMALLY INVASIVE MAZE PROCEDURE (N/A) TRANSESOPHAGEAL ECHOCARDIOGRAM (TEE) (N/A) CLIPPING OF  ATRIAL APPENDAGE  1. CV-NSR in the 80s. BP stable. On Amiodarone 200 mg daily and Coumadin, Toprol.  2. Pulm-Tolerating room air without issue. Encourage incentive spirometry.  3. Renal-no new labs. Weight stable. Continue Lasix 40mg  daily. 4. Hallucinations and combative behavior last night. Xanax restarted and Haldol given. UA ordered and results reviewed. Culture in progress. Cipro started.   Plan-Continue to monitor mental status closely. Continue Cipro for UTI. Can possible go home tomorrow if no other visual issues, husband does not have the flu and has been feeling better. Will need INR drawn on Monday by home health if goes home tomorrow.    LOS: 8 days    Elgie Collard 07/13/2016   I have seen and examined the patient and agree with the assessment and plan as outlined.  Tentatively plan d/c home tomorrow.  Will need to f/u urine culture and decide upon whether or not to continue Cipro at d/c due to interactions with coumadin and amiodarone.  Rexene Alberts, MD 07/13/2016 5:28 PM

## 2016-07-13 NOTE — Progress Notes (Signed)
qPhysical Therapy Treatment Patient Details Name: [name] [name] MRN: [id number] DOB: 14-Mar-1941 Today's Date: 07/13/2016    History of Present Illness 76 yo admitted s/p mini MVR. Pt with hallucinations starting 3/21. PMHx: Afib, mitral regurgitation, HF, COPD, HLD, colon CA    PT Comments    Excellent progress. Pt more clear today. No evidence of confusion or hallucinations. Pt states she is feeling much better. She is ambulating 600 feet supervision without AD. No f/u PT services will be needed at d/c. PT to continue to follow acutely to ensure safe d/c home.   Follow Up Recommendations  Supervision for mobility/OOB;No PT follow up     Equipment Recommendations  None recommended by PT    Recommendations for Other Services       Precautions / Restrictions Precautions Precautions: Fall    Mobility  Bed Mobility Overal bed mobility: Independent                Transfers Overall transfer level: Modified independent                  Ambulation/Gait Ambulation/Gait assistance: Modified independent (Device/Increase time);Supervision Ambulation Distance (Feet): 600 Feet Assistive device: None Gait Pattern/deviations: WFL(Within Functional Limits)   Gait velocity interpretation: at or above normal speed for age/gender General Gait Details: steady gait, no LOB   Stairs            Wheelchair Mobility    Modified Rankin (Stroke Patients Only)       Balance   Sitting-balance support: Feet supported;No upper extremity supported Sitting balance-Leahy Scale: Normal     Standing balance support: During functional activity;No upper extremity supported Standing balance-Leahy Scale: Good                              Cognition Arousal/Alertness: Awake/alert Behavior During Therapy: WFL for tasks assessed/performed Overall Cognitive Status: Within Functional Limits for tasks assessed                                  General Comments: Confusion/hallucinations have cleared.      Exercises      General Comments        Pertinent Vitals/Pain Pain Assessment: No/denies pain    Home Living                      Prior Function            PT Goals (current goals can now be found in the care plan section) Acute Rehab PT Goals Patient Stated Goal: return home PT Goal Formulation: With patient Time For Goal Achievement: 07/24/16 Potential to Achieve Goals: Good Progress towards PT goals: Progressing toward goals    Frequency    Min 3X/week      PT Plan Discharge plan needs to be updated    Co-evaluation             End of Session Equipment Utilized During Treatment: Gait belt Activity Tolerance: Patient tolerated treatment well Patient left: in chair;with call bell/phone within reach Nurse Communication: Mobility status PT Visit Diagnosis: Other abnormalities of gait and mobility (R26.89)     Time: 4315-4008 PT Time Calculation (min) (ACUTE ONLY): 11 min  Charges:  $Gait Training: 8-22 mins                    G  Codes:       Lorrin Goodell, PT  Office # [phone] Pager [phone]    Lorriane Shire 07/13/2016, 10:45 AM

## 2016-07-14 LAB — PROTIME-INR
INR: 2.46
Prothrombin Time: 27.1 seconds — ABNORMAL HIGH (ref 11.4–15.2)

## 2016-07-14 MED ORDER — TRAMADOL HCL 50 MG PO TABS: ORAL_TABLET | ORAL | 0 refills | Status: DC

## 2016-07-14 MED ORDER — WARFARIN SODIUM 2.5 MG PO TABS
2.5000 mg | ORAL_TABLET | Freq: Every day | ORAL | 1 refills | Status: DC
Start: 2016-07-14 — End: 2016-09-11

## 2016-07-14 MED ORDER — FUROSEMIDE 40 MG PO TABS: 40.0000 mg | ORAL_TABLET | Freq: Every day | ORAL | 1 refills | Status: DC

## 2016-07-14 MED ORDER — ASPIRIN 81 MG PO TBEC: 81.0000 mg | DELAYED_RELEASE_TABLET | Freq: Every day | ORAL | Status: DC

## 2016-07-14 MED ORDER — METOPROLOL SUCCINATE ER 25 MG PO TB24: 25.0000 mg | ORAL_TABLET | Freq: Every day | ORAL | 1 refills | Status: DC

## 2016-07-14 MED ORDER — CIPROFLOXACIN HCL 500 MG PO TABS: 500.0000 mg | ORAL_TABLET | Freq: Two times a day (BID) | ORAL | 0 refills | Status: DC

## 2016-07-14 MED ORDER — AMIODARONE HCL 200 MG PO TABS: 200.0000 mg | ORAL_TABLET | Freq: Every day | ORAL | 1 refills | Status: DC

## 2016-07-14 MED ORDER — POTASSIUM CHLORIDE CRYS ER 20 MEQ PO TBCR: 20.0000 meq | EXTENDED_RELEASE_TABLET | Freq: Every day | ORAL | 1 refills | Status: DC

## 2016-07-14 MED ORDER — CHLORHEXIDINE GLUCONATE 0.12 % MT SOLN
OROMUCOSAL | Status: AC
  Filled 2016-07-14: qty 15

## 2016-07-14 NOTE — Progress Notes (Addendum)
      [address]       [place] [address]             [phone]        9 Days Post-Op Procedure(s) (LRB): MINIMALLY INVASIVE MITRAL VALVE REPAIR (MVR) (Right) MINIMALLY INVASIVE MAZE PROCEDURE (N/A) TRANSESOPHAGEAL ECHOCARDIOGRAM (TEE) (N/A) CLIPPING OF ATRIAL APPENDAGE  Subjective: Patient at sink about to brush her teeth. No more hallucinations. She is alert and oriented this am.  Objective: Vital signs in last 24 hours: Temp:  [98.6 F (37 C)-98.7 F (37.1 C)] 98.6 F (37 C) (03/24 0628) Pulse Rate:  [87-124] 90 (03/24 0628) Cardiac Rhythm: Normal sinus rhythm;Heart block (03/24 0802) Resp:  [18] 18 (03/24 0628) BP: (105-116)/(60-67) 116/67 (03/24 0628) SpO2:  [93 %-100 %] 96 % (03/24 0628) Weight:  [69.8 kg (153 lb 14.4 oz)] 69.8 kg (153 lb 14.4 oz) (03/24 0628)  Pre op weight 71.9 kg Current Weight  07/14/16 69.8 kg (153 lb 14.4 oz)       Intake/Output from previous day: No intake/output data recorded.   Physical Exam:  Cardiovascular: RRR, no murmur Pulmonary: Clear to auscultation bilaterally Abdomen: Soft, non tender, bowel sounds present. Extremities: Trace bilateral lower extremity edema. Wounds: Clean and dry.  No erythema or signs of infection.  Lab Results: CBC:No results for input(s): WBC, HGB, HCT, PLT in the last 72 hours. BMET: No results for input(s): NA, K, CL, CO2, GLUCOSE, BUN, CREATININE, CALCIUM in the last 72 hours.  PT/INR:  Lab Results  Component Value Date   INR 2.46 07/14/2016   INR 2.78 07/13/2016   INR 2.59 07/12/2016   ABG:  INR: Will add last result for INR, ABG once components are confirmed Will add last 4 CBG results once components are confirmed  Assessment/Plan:  1. CV - SR in the 80's. On Amiodarone 200 mg daily,Toprol XL 25 mg daily and Coumadin. INR decreased from 2.78 to 2.46. Will continue with Coumadin 2.5 mg daily. 2.  Pulmonary - On room air. Encourage incentive spirometer. 3. Volume  Overload - On Lasix 40 mg daily 4.  Acute blood loss anemia - Last H and H stable at 9.6 and 20.3 5. Mental status changes-On Cipro. UC results not available yet. As discussed with Dr. Roxy Manns,  will continue Cipro for 3 more days then stop. 6. Patient instructed if anterior chest tube site drains, apply dry 4 x 4's and tape and change daily and PRN. 7. Will discharge today  ZIMMERMAN,DONIELLE MPA-C 07/14/2016,8:03 AM  I have seen and examined the patient and agree with the assessment and plan as outlined.  D/C home  Rexene Alberts, MD 07/14/2016 12:51 PM

## 2016-07-14 NOTE — Progress Notes (Signed)
Pt slept well overnight. No impulsiveness. Pt did not hallucinate nor become combative. Pt A&Ox4, calm and cooperative.

## 2016-07-14 NOTE — Progress Notes (Signed)
Discharged to home with family office visits in place teaching done  

## 2016-07-14 NOTE — Care Management Note (Signed)
Case Management Note  Patient Details  Name: [name] [name] MRN: [id number] Date of Birth: 1940-12-18  Subjective/Objective:                    Action/Plan:   Expected Discharge Date:  07/14/16               Expected Discharge Plan:  [place]  In-House Referral:     Discharge planning Services  CM Consult  Post Acute Care Choice:  Home Health, Durable Medical Equipment Choice offered to:  Patient  DME Arranged:  Walker rolling, N/A DME Agency:  [facility] Arranged:  PT, OT [facility] Agency:  [facility]  Status of Service:  Completed, signed off  If discussed at [place] of Stay Meetings, dates discussed:    Additional Comments: CM notes previous CM, Marita [place] has arranged HHPT/OT with [facility].  CM called [facility] rep, Jermaine to make aware of discharge. No other CM needs were communicated. [name] Catholic, RN 07/14/2016, 12:47 PM

## 2016-07-15 DIAGNOSIS — I5033 Acute on chronic diastolic (congestive) heart failure: Secondary | ICD-10-CM | POA: Diagnosis not present

## 2016-07-15 DIAGNOSIS — Z48812 Encounter for surgical aftercare following surgery on the circulatory system: Secondary | ICD-10-CM | POA: Diagnosis not present

## 2016-07-15 DIAGNOSIS — J449 Chronic obstructive pulmonary disease, unspecified: Secondary | ICD-10-CM | POA: Diagnosis not present

## 2016-07-15 DIAGNOSIS — Z7982 Long term (current) use of aspirin: Secondary | ICD-10-CM | POA: Diagnosis not present

## 2016-07-15 DIAGNOSIS — N390 Urinary tract infection, site not specified: Secondary | ICD-10-CM | POA: Diagnosis not present

## 2016-07-15 DIAGNOSIS — I482 Chronic atrial fibrillation: Secondary | ICD-10-CM | POA: Diagnosis not present

## 2016-07-15 DIAGNOSIS — F329 Major depressive disorder, single episode, unspecified: Secondary | ICD-10-CM | POA: Diagnosis not present

## 2016-07-15 DIAGNOSIS — Z7901 Long term (current) use of anticoagulants: Secondary | ICD-10-CM | POA: Diagnosis not present

## 2016-07-15 DIAGNOSIS — Z5181 Encounter for therapeutic drug level monitoring: Secondary | ICD-10-CM | POA: Diagnosis not present

## 2016-07-15 DIAGNOSIS — E785 Hyperlipidemia, unspecified: Secondary | ICD-10-CM | POA: Diagnosis not present

## 2016-07-15 DIAGNOSIS — Z85038 Personal history of other malignant neoplasm of large intestine: Secondary | ICD-10-CM | POA: Diagnosis not present

## 2016-07-15 DIAGNOSIS — Z952 Presence of prosthetic heart valve: Secondary | ICD-10-CM | POA: Diagnosis not present

## 2016-07-15 LAB — URINE CULTURE: Culture: 60000 — AB

## 2016-07-16 ENCOUNTER — Telehealth: Payer: Self-pay | Admitting: Cardiology

## 2016-07-16 ENCOUNTER — Ambulatory Visit (INDEPENDENT_AMBULATORY_CARE_PROVIDER_SITE_OTHER): Payer: Medicare Other | Admitting: *Deleted

## 2016-07-16 DIAGNOSIS — N390 Urinary tract infection, site not specified: Secondary | ICD-10-CM | POA: Diagnosis not present

## 2016-07-16 DIAGNOSIS — Z48812 Encounter for surgical aftercare following surgery on the circulatory system: Secondary | ICD-10-CM | POA: Diagnosis not present

## 2016-07-16 DIAGNOSIS — I481 Persistent atrial fibrillation: Secondary | ICD-10-CM

## 2016-07-16 DIAGNOSIS — J449 Chronic obstructive pulmonary disease, unspecified: Secondary | ICD-10-CM | POA: Diagnosis not present

## 2016-07-16 DIAGNOSIS — I5033 Acute on chronic diastolic (congestive) heart failure: Secondary | ICD-10-CM | POA: Diagnosis not present

## 2016-07-16 DIAGNOSIS — I4819 Other persistent atrial fibrillation: Secondary | ICD-10-CM

## 2016-07-16 DIAGNOSIS — I482 Chronic atrial fibrillation: Secondary | ICD-10-CM | POA: Diagnosis not present

## 2016-07-16 DIAGNOSIS — Z5181 Encounter for therapeutic drug level monitoring: Secondary | ICD-10-CM | POA: Insufficient documentation

## 2016-07-16 DIAGNOSIS — Z9889 Other specified postprocedural states: Secondary | ICD-10-CM

## 2016-07-16 DIAGNOSIS — E785 Hyperlipidemia, unspecified: Secondary | ICD-10-CM | POA: Diagnosis not present

## 2016-07-16 LAB — POCT INR: INR: 3.3

## 2016-07-16 NOTE — Telephone Encounter (Signed)
Done.  See coumadin note. 

## 2016-07-16 NOTE — Telephone Encounter (Signed)
3.3 pt inr

## 2016-07-17 ENCOUNTER — Telehealth (HOSPITAL_COMMUNITY): Payer: Self-pay | Admitting: Physician Assistant

## 2016-07-17 NOTE — Telephone Encounter (Signed)
[address]       [name] [address]             [phone]    Lovena Kluck Pund [phone]   S/P minimally invasive mitral valve repair and MAZE procedure on  07/05/2016.  Discharged home on 07/14/2016.  Medications:  Current Outpatient Prescriptions on File Prior to Visit  Medication Sig Dispense Refill  . acetaminophen (TYLENOL) 325 MG tablet Take 650 mg by mouth every 6 (six) hours as needed for pain.    Marland Kitchen albuterol (PROVENTIL HFA;VENTOLIN HFA) 108 (90 BASE) MCG/ACT inhaler Inhale 2 puffs into the lungs every 6 (six) hours as needed for wheezing.    Marland Kitchen ALPRAZolam (XANAX) 0.5 MG tablet Take 0.5 mg by mouth 2 (two) times daily as needed (for anxiety/sleep (scheduled at bedtime)).     Marland Kitchen amiodarone (PACERONE) 200 MG tablet Take 1 tablet (200 mg total) by mouth daily. 30 tablet 1  . aspirin EC 81 MG EC tablet Take 1 tablet (81 mg total) by mouth daily.    . calcium carbonate (OS-CAL) 600 MG TABS tablet Take 600 mg by mouth 2 (two) times daily with a meal.    . ciprofloxacin (CIPRO) 500 MG tablet Take 1 tablet (500 mg total) by mouth 2 (two) times daily. For 3 days then stop. 5 tablet 0  . Coenzyme Q10 100 MG TABS Take 100 mg by mouth every evening.     . docusate sodium (COLACE) 100 MG capsule Take 200 mg by mouth daily as needed for mild constipation.     . furosemide (LASIX) 40 MG tablet Take 1 tablet (40 mg total) by mouth daily. 30 tablet 1  . hydrocortisone (ANUSOL-HC) 2.5 % rectal cream Place 1 application rectally 2 (two) times daily. FOR UP TO 10 DAYS AT A TIME (Patient taking differently: Place 1 application rectally 2 (two) times daily as needed for hemorrhoids or itching. FOR UP TO 10 DAYS AT A TIME) 30 g 1  . metoprolol succinate (TOPROL-XL) 25 MG 24 hr tablet Take 1 tablet (25 mg total) by mouth daily. 30 tablet 1  . Multiple Vitamin (MULTIVITAMIN) tablet Take 1 tablet by mouth daily.      Marland Kitchen omeprazole (PRILOSEC) 20 MG capsule Take 1 capsule (20 mg  total) by mouth daily. 30 capsule 3  . PARoxetine (PAXIL) 20 MG tablet Take 20 mg by mouth daily.     Vladimir Faster Glycol-Propyl Glycol (SYSTANE ULTRA) 0.4-0.3 % SOLN Place 1 drop into both eyes 3 (three) times daily.    . potassium chloride SA (K-DUR,KLOR-CON) 20 MEQ tablet Take 1 tablet (20 mEq total) by mouth daily. 30 tablet 1  . sodium chloride (OCEAN) 0.65 % SOLN nasal spray Place 1 spray into both nostrils as needed for congestion.    . traMADol (ULTRAM) 50 MG tablet Take 50 mg by mouth every 4-6 hours PRN severe pain. 30 tablet 0  . warfarin (COUMADIN) 2.5 MG tablet Take 1 tablet (2.5 mg total) by mouth daily at 6 PM. Or as directed. 30 tablet 1   No current facility-administered medications on file prior to visit.     Coumadin:  INR check Yes/No  Problems/Concerns:  Assessment:  Patient is doing well.  She shares that home health came out to the house yesterday and her INR was 3.3. She was instructed on what dose they wanted her taking. She continued to make progress with mobility and PT is also visiting her house.  She feels stronger every day. She is no longer having hallucinations and is taking her antibiotic. She did not have any issues getting her prescriptions filled at the pharmacy. She knows about her follow-up appointment next week and plans to attend. She is currently not having any issues but is encouraged to contact office if concerns or problems develop.   Follow up Appointment:    Rexene Alberts, MD Follow up on 07/23/2016.   Specialty:  Cardiothoracic Surgery Why:  PA/LAT CXR to be taken (at [facility] which is in the same building as Dr. Guy Sandifer office) on 07/23/2016 at 12:30 pm;Appointment time is at 1:00 pm Contact information: [address] [place] [address] [phone]

## 2016-07-18 DIAGNOSIS — I5033 Acute on chronic diastolic (congestive) heart failure: Secondary | ICD-10-CM | POA: Diagnosis not present

## 2016-07-18 DIAGNOSIS — Z48812 Encounter for surgical aftercare following surgery on the circulatory system: Secondary | ICD-10-CM | POA: Diagnosis not present

## 2016-07-18 DIAGNOSIS — N390 Urinary tract infection, site not specified: Secondary | ICD-10-CM | POA: Diagnosis not present

## 2016-07-18 DIAGNOSIS — J449 Chronic obstructive pulmonary disease, unspecified: Secondary | ICD-10-CM | POA: Diagnosis not present

## 2016-07-18 DIAGNOSIS — I482 Chronic atrial fibrillation: Secondary | ICD-10-CM | POA: Diagnosis not present

## 2016-07-18 DIAGNOSIS — E785 Hyperlipidemia, unspecified: Secondary | ICD-10-CM | POA: Diagnosis not present

## 2016-07-19 ENCOUNTER — Ambulatory Visit (INDEPENDENT_AMBULATORY_CARE_PROVIDER_SITE_OTHER): Payer: Medicare Other | Admitting: *Deleted

## 2016-07-19 DIAGNOSIS — I482 Chronic atrial fibrillation: Secondary | ICD-10-CM | POA: Diagnosis not present

## 2016-07-19 DIAGNOSIS — J449 Chronic obstructive pulmonary disease, unspecified: Secondary | ICD-10-CM | POA: Diagnosis not present

## 2016-07-19 DIAGNOSIS — I481 Persistent atrial fibrillation: Secondary | ICD-10-CM

## 2016-07-19 DIAGNOSIS — I5033 Acute on chronic diastolic (congestive) heart failure: Secondary | ICD-10-CM | POA: Diagnosis not present

## 2016-07-19 DIAGNOSIS — Z5181 Encounter for therapeutic drug level monitoring: Secondary | ICD-10-CM

## 2016-07-19 DIAGNOSIS — Z9889 Other specified postprocedural states: Secondary | ICD-10-CM

## 2016-07-19 DIAGNOSIS — N390 Urinary tract infection, site not specified: Secondary | ICD-10-CM | POA: Diagnosis not present

## 2016-07-19 DIAGNOSIS — Z48812 Encounter for surgical aftercare following surgery on the circulatory system: Secondary | ICD-10-CM | POA: Diagnosis not present

## 2016-07-19 DIAGNOSIS — E785 Hyperlipidemia, unspecified: Secondary | ICD-10-CM | POA: Diagnosis not present

## 2016-07-19 DIAGNOSIS — I4819 Other persistent atrial fibrillation: Secondary | ICD-10-CM

## 2016-07-19 LAB — POCT INR: INR: 3.7

## 2016-07-23 ENCOUNTER — Other Ambulatory Visit: Payer: Self-pay | Admitting: *Deleted

## 2016-07-23 ENCOUNTER — Ambulatory Visit (INDEPENDENT_AMBULATORY_CARE_PROVIDER_SITE_OTHER): Payer: Self-pay | Admitting: Physician Assistant

## 2016-07-23 ENCOUNTER — Ambulatory Visit
Admission: RE | Admit: 2016-07-23 | Discharge: 2016-07-23 | Disposition: A | Discharge status: Home / Self Care | Payer: Medicare Other | Source: Ambulatory Visit | Attending: Thoracic Surgery (Cardiothoracic Vascular Surgery) | Admitting: Thoracic Surgery (Cardiothoracic Vascular Surgery)

## 2016-07-23 VITALS — BP 100/69 | HR 110 | Resp 20 | Ht 62.0 in | Wt 153.0 lb

## 2016-07-23 DIAGNOSIS — J90 Pleural effusion, not elsewhere classified: Secondary | ICD-10-CM

## 2016-07-23 DIAGNOSIS — Z9889 Other specified postprocedural states: Secondary | ICD-10-CM

## 2016-07-23 DIAGNOSIS — R0602 Shortness of breath: Secondary | ICD-10-CM | POA: Diagnosis not present

## 2016-07-23 DIAGNOSIS — I361 Nonrheumatic tricuspid (valve) insufficiency: Secondary | ICD-10-CM

## 2016-07-23 DIAGNOSIS — Z8679 Personal history of other diseases of the circulatory system: Secondary | ICD-10-CM

## 2016-07-23 DIAGNOSIS — I340 Nonrheumatic mitral (valve) insufficiency: Secondary | ICD-10-CM

## 2016-07-23 DIAGNOSIS — J939 Pneumothorax, unspecified: Secondary | ICD-10-CM

## 2016-07-23 NOTE — Progress Notes (Signed)
HPI: Patient returns for routine postoperative follow-up having undergone Mini MV Repair and MAZE on 07/05/2016 . The patient's early postoperative recovery while in the hospital was notable for UTI with delirium and confusion Since hospital discharge the patient reports she is doing okay.  She states that her hemorrhoids are bothering her and she also is having burning with urination.  She is scheduled to be evaluated by her PCP tomorrow for these issues.  She had a UTI in the hospital which was due to Klebsiella and she completed treatment with Cipro on 07/18/2016.  She also complains of shortness of breath.  She states she has noticed she has been getting winded more frequently over the past week.  She is ambulating without much difficulty.  Her appetite has improved. She states her incisions are healing well.   Current Outpatient Prescriptions  Medication Sig Dispense Refill  . acetaminophen (TYLENOL) 325 MG tablet Take 650 mg by mouth every 6 (six) hours as needed for pain.    Marland Kitchen albuterol (PROVENTIL HFA;VENTOLIN HFA) 108 (90 BASE) MCG/ACT inhaler Inhale 2 puffs into the lungs every 6 (six) hours as needed for wheezing.    Marland Kitchen ALPRAZolam (XANAX) 0.5 MG tablet Take 0.5 mg by mouth 2 (two) times daily as needed (for anxiety/sleep (scheduled at bedtime)).     Marland Kitchen amiodarone (PACERONE) 200 MG tablet Take 1 tablet (200 mg total) by mouth daily. 30 tablet 1  . aspirin EC 81 MG EC tablet Take 1 tablet (81 mg total) by mouth daily.    . calcium carbonate (OS-CAL) 600 MG TABS tablet Take 600 mg by mouth 2 (two) times daily with a meal.    . Coenzyme Q10 100 MG TABS Take 100 mg by mouth every evening.     . docusate sodium (COLACE) 100 MG capsule Take 200 mg by mouth daily as needed for mild constipation.     . furosemide (LASIX) 40 MG tablet Take 1 tablet (40 mg total) by mouth daily. 30 tablet 1  . hydrocortisone (ANUSOL-HC) 2.5 % rectal cream Place 1 application rectally 2 (two) times daily. FOR UP TO 10  DAYS AT A TIME (Patient taking differently: Place 1 application rectally 2 (two) times daily as needed for hemorrhoids or itching. FOR UP TO 10 DAYS AT A TIME) 30 g 1  . metoprolol succinate (TOPROL-XL) 25 MG 24 hr tablet Take 1 tablet (25 mg total) by mouth daily. 30 tablet 1  . Multiple Vitamin (MULTIVITAMIN) tablet Take 1 tablet by mouth daily.      Marland Kitchen omeprazole (PRILOSEC) 20 MG capsule Take 1 capsule (20 mg total) by mouth daily. 30 capsule 3  . PARoxetine (PAXIL) 20 MG tablet Take 20 mg by mouth daily.     Vladimir Faster Glycol-Propyl Glycol (SYSTANE ULTRA) 0.4-0.3 % SOLN Place 1 drop into both eyes 3 (three) times daily.    . potassium chloride SA (K-DUR,KLOR-CON) 20 MEQ tablet Take 1 tablet (20 mEq total) by mouth daily. 30 tablet 1  . sodium chloride (OCEAN) 0.65 % SOLN nasal spray Place 1 spray into both nostrils as needed for congestion.    . traMADol (ULTRAM) 50 MG tablet Take 50 mg by mouth every 4-6 hours PRN severe pain. 30 tablet 0  . warfarin (COUMADIN) 2.5 MG tablet Take 1 tablet (2.5 mg total) by mouth daily at 6 PM. Or as directed. 30 tablet 1   No current facility-administered medications for this visit.     Physical Exam:  BP 100/69  Pulse (!) 110   Resp 20   Ht 5\' 2"  (1.575 m)   Wt 153 lb (69.4 kg)   SpO2 96% Comment: RA  BMI 27.98 kg/m   Gen: no apparent distress Heart: IRR Lungs: diminished right base Ext: no edema Incisions; well healed, sutures remain in place  Diagnostic Tests:  CXR: moderate right sided pleural effusion, resolution of left sided pleural effusion  A/P:  1. S/P MINI MV Repair, MAZE- doing well, continue Amiodarone, Coumadin, and Lopressor 2. Right sided pleural effusion- will send for Thoracentesis.. Patient with need to hold coumadin for 48 hours prior to procedure 3. Dysuria- completed Cipro for UTI from Klebsiella on 3/28, will not prescribe new ABX as she is being evaluated by PCP tomorrow and would not want to interfere with new UA  culture 4. Dispo- patient doing well, will get Thoracentesis Friday, resume coumadin Friday evening, F/U with PCP tomorrow for dysuria.... RTC in 3 weeks with repeat CXR with Dr. Geraldo Docker, PA-C [facility] [phone]

## 2016-07-23 NOTE — Patient Instructions (Signed)
You may return to driving an automobile as long as you are no longer requiring oral narcotic pain relievers during the daytime.  It would be wise to start driving only short distances during the daylight and gradually increase from there as you feel comfortable.   Make every effort to maintain a "heart-healthy" lifestyle with regular physical exercise and adherence to a low-fat, low-carbohydrate diet.  Continue to seek regular follow-up appointments with your primary care physician and/or cardiologist.   Endocarditis is a potentially serious infection of heart valves or inside lining of the heart.  It occurs more commonly in patients with diseased heart valves (such as patient's with aortic or mitral valve disease) and in patients who have undergone heart valve repair or replacement.  Certain surgical and dental procedures may put you at risk, such as dental cleaning, other dental procedures, or any surgery involving the respiratory, urinary, gastrointestinal tract, gallbladder or prostate gland.   To minimize your chances for develooping endocarditis, maintain good oral health and seek prompt medical attention for any infections involving the mouth, teeth, gums, skin or urinary tract.    Always notify your doctor or dentist about your underlying heart valve condition before having any invasive procedures. You will need to take antibiotics before certain procedures, including all routine dental cleanings or other dental procedures.  Your cardiologist or dentist should prescribe these antibiotics for you to be taken ahead of time.       

## 2016-07-24 ENCOUNTER — Telehealth: Payer: Self-pay | Admitting: *Deleted

## 2016-07-24 ENCOUNTER — Other Ambulatory Visit: Payer: Self-pay | Admitting: *Deleted

## 2016-07-24 DIAGNOSIS — Z48812 Encounter for surgical aftercare following surgery on the circulatory system: Secondary | ICD-10-CM | POA: Diagnosis not present

## 2016-07-24 DIAGNOSIS — I5033 Acute on chronic diastolic (congestive) heart failure: Secondary | ICD-10-CM | POA: Diagnosis not present

## 2016-07-24 DIAGNOSIS — R300 Dysuria: Secondary | ICD-10-CM | POA: Diagnosis not present

## 2016-07-24 DIAGNOSIS — N390 Urinary tract infection, site not specified: Secondary | ICD-10-CM | POA: Diagnosis not present

## 2016-07-24 DIAGNOSIS — M545 Low back pain: Secondary | ICD-10-CM | POA: Diagnosis not present

## 2016-07-24 DIAGNOSIS — I482 Chronic atrial fibrillation: Secondary | ICD-10-CM | POA: Diagnosis not present

## 2016-07-24 DIAGNOSIS — Z9889 Other specified postprocedural states: Secondary | ICD-10-CM | POA: Diagnosis not present

## 2016-07-24 DIAGNOSIS — E785 Hyperlipidemia, unspecified: Secondary | ICD-10-CM | POA: Diagnosis not present

## 2016-07-24 DIAGNOSIS — J90 Pleural effusion, not elsewhere classified: Secondary | ICD-10-CM

## 2016-07-24 DIAGNOSIS — Z299 Encounter for prophylactic measures, unspecified: Secondary | ICD-10-CM | POA: Diagnosis not present

## 2016-07-24 DIAGNOSIS — R609 Edema, unspecified: Secondary | ICD-10-CM | POA: Diagnosis not present

## 2016-07-24 DIAGNOSIS — I4891 Unspecified atrial fibrillation: Secondary | ICD-10-CM | POA: Diagnosis not present

## 2016-07-24 DIAGNOSIS — Z09 Encounter for follow-up examination after completed treatment for conditions other than malignant neoplasm: Secondary | ICD-10-CM | POA: Diagnosis not present

## 2016-07-24 DIAGNOSIS — J449 Chronic obstructive pulmonary disease, unspecified: Secondary | ICD-10-CM | POA: Diagnosis not present

## 2016-07-24 DIAGNOSIS — Z713 Dietary counseling and surveillance: Secondary | ICD-10-CM | POA: Diagnosis not present

## 2016-07-24 LAB — POCT INR: INR: 1.6

## 2016-07-24 NOTE — Telephone Encounter (Signed)
Pt was scheduled for an INR check on Thursday 07/26/16.  Pt has been scheduled for a thoracentesis on Friday 07/27/16 and was told to hold her coumadin 48 hours before.  Pt will restart coumadin Friday night and INR will be checked on Tuesday 07/31/16

## 2016-07-25 DIAGNOSIS — I482 Chronic atrial fibrillation: Secondary | ICD-10-CM | POA: Diagnosis not present

## 2016-07-25 DIAGNOSIS — E785 Hyperlipidemia, unspecified: Secondary | ICD-10-CM | POA: Diagnosis not present

## 2016-07-25 DIAGNOSIS — I5033 Acute on chronic diastolic (congestive) heart failure: Secondary | ICD-10-CM | POA: Diagnosis not present

## 2016-07-25 DIAGNOSIS — J449 Chronic obstructive pulmonary disease, unspecified: Secondary | ICD-10-CM | POA: Diagnosis not present

## 2016-07-25 DIAGNOSIS — N390 Urinary tract infection, site not specified: Secondary | ICD-10-CM | POA: Diagnosis not present

## 2016-07-25 DIAGNOSIS — Z48812 Encounter for surgical aftercare following surgery on the circulatory system: Secondary | ICD-10-CM | POA: Diagnosis not present

## 2016-07-26 DIAGNOSIS — E785 Hyperlipidemia, unspecified: Secondary | ICD-10-CM | POA: Diagnosis not present

## 2016-07-26 DIAGNOSIS — Z48812 Encounter for surgical aftercare following surgery on the circulatory system: Secondary | ICD-10-CM | POA: Diagnosis not present

## 2016-07-26 DIAGNOSIS — N390 Urinary tract infection, site not specified: Secondary | ICD-10-CM | POA: Diagnosis not present

## 2016-07-26 DIAGNOSIS — I482 Chronic atrial fibrillation: Secondary | ICD-10-CM | POA: Diagnosis not present

## 2016-07-26 DIAGNOSIS — I5033 Acute on chronic diastolic (congestive) heart failure: Secondary | ICD-10-CM | POA: Diagnosis not present

## 2016-07-26 DIAGNOSIS — J449 Chronic obstructive pulmonary disease, unspecified: Secondary | ICD-10-CM | POA: Diagnosis not present

## 2016-07-27 ENCOUNTER — Encounter: Payer: Self-pay | Admitting: Cardiology

## 2016-07-27 ENCOUNTER — Ambulatory Visit (HOSPITAL_COMMUNITY)
Admission: RE | Admit: 2016-07-27 | Discharge: 2016-07-27 | Disposition: A | Discharge status: Home / Self Care | Payer: Medicare Other | Source: Ambulatory Visit | Attending: Thoracic Surgery (Cardiothoracic Vascular Surgery) | Admitting: Thoracic Surgery (Cardiothoracic Vascular Surgery)

## 2016-07-27 ENCOUNTER — Ambulatory Visit (INDEPENDENT_AMBULATORY_CARE_PROVIDER_SITE_OTHER): Payer: Medicare Other | Admitting: Cardiology

## 2016-07-27 ENCOUNTER — Telehealth: Payer: Self-pay | Admitting: Cardiology

## 2016-07-27 ENCOUNTER — Ambulatory Visit (HOSPITAL_COMMUNITY)
Admission: RE | Admit: 2016-07-27 | Discharge: 2016-07-27 | Disposition: A | Discharge status: Home / Self Care | Payer: Medicare Other | Source: Ambulatory Visit | Attending: Diagnostic Radiology | Admitting: Diagnostic Radiology

## 2016-07-27 ENCOUNTER — Other Ambulatory Visit (HOSPITAL_COMMUNITY)
Admission: RE | Admit: 2016-07-27 | Discharge: 2016-07-27 | Disposition: A | Discharge status: Home / Self Care | Payer: Medicare Other | Source: Ambulatory Visit | Attending: Cardiology | Admitting: Cardiology

## 2016-07-27 VITALS — BP 104/65 | HR 110 | Ht 62.0 in | Wt 155.8 lb

## 2016-07-27 DIAGNOSIS — I340 Nonrheumatic mitral (valve) insufficiency: Secondary | ICD-10-CM | POA: Insufficient documentation

## 2016-07-27 DIAGNOSIS — J90 Pleural effusion, not elsewhere classified: Secondary | ICD-10-CM | POA: Diagnosis not present

## 2016-07-27 DIAGNOSIS — E782 Mixed hyperlipidemia: Secondary | ICD-10-CM

## 2016-07-27 DIAGNOSIS — Z9889 Other specified postprocedural states: Secondary | ICD-10-CM

## 2016-07-27 DIAGNOSIS — K449 Diaphragmatic hernia without obstruction or gangrene: Secondary | ICD-10-CM | POA: Diagnosis not present

## 2016-07-27 DIAGNOSIS — I481 Persistent atrial fibrillation: Secondary | ICD-10-CM | POA: Insufficient documentation

## 2016-07-27 DIAGNOSIS — I4819 Other persistent atrial fibrillation: Secondary | ICD-10-CM

## 2016-07-27 LAB — BASIC METABOLIC PANEL
Anion gap: 8 (ref 5–15)
BUN: 10 mg/dL (ref 6–20)
CALCIUM: 8.7 mg/dL — AB (ref 8.9–10.3)
CHLORIDE: 99 mmol/L — AB (ref 101–111)
CO2: 30 mmol/L (ref 22–32)
Creatinine, Ser: 0.8 mg/dL (ref 0.44–1.00)
GFR calc non Af Amer: >60 mL/min (ref >60)
Glucose, Bld: 114 mg/dL — ABNORMAL HIGH (ref 65–99)
Potassium: 3.9 mmol/L (ref 3.5–5.1)
SODIUM: 137 mmol/L (ref 135–145)

## 2016-07-27 LAB — CBC
HCT: 31.5 % — ABNORMAL LOW (ref 36.0–46.0)
HEMOGLOBIN: 9.9 g/dL — AB (ref 12.0–15.0)
MCH: 29.3 pg (ref 26.0–34.0)
MCHC: 31.4 g/dL (ref 30.0–36.0)
MCV: 93.2 fL (ref 78.0–100.0)
Platelets: 469 10*3/uL — ABNORMAL HIGH (ref 150–400)
RBC: 3.38 MIL/uL — ABNORMAL LOW (ref 3.87–5.11)
RDW: 13.7 % (ref 11.5–15.5)
WBC: 8.6 10*3/uL (ref 4.0–10.5)

## 2016-07-27 LAB — TSH: TSH: 3.327 u[IU]/mL (ref 0.350–4.500)

## 2016-07-27 LAB — MAGNESIUM: Magnesium: 2 mg/dL (ref 1.7–2.4)

## 2016-07-27 MED ORDER — METOPROLOL SUCCINATE ER 25 MG PO TB24: 37.5000 mg | ORAL_TABLET | Freq: Every day | ORAL | 1 refills | Status: DC

## 2016-07-27 NOTE — Discharge Instructions (Signed)
Thoracentesis °A thoracentesis is a procedure to remove fluid that has built up in the space between the linings of the chest wall and the lungs (pleural space). It is normal to have a small amount of fluid in the pleural space. Some medical conditions, such as heart failure, pneumonia, kidney problems, or cancer, can create too much fluid. This extra fluid is removed using a needle that is inserted through the skin and tissue and into the pleural space. °A thoracentesis may be done to: °· Understand why there is extra fluid in the pleural space and create a treatment plan that is right for you. °· Help to get rid of shortness of breath, discomfort, or pain that is caused by the extra fluid. °Tell a health care provider about: °· Any allergies you have. °· All medicines you are taking, including vitamins, herbs, eye drops, creams, and over-the-counter medicines. This includes any use of steroids, either by mouth or in a cream. °· Any problems you or family members have had with anesthetic medicines. °· Any blood disorders you have, including any history of blood clots. °· Any surgeries you have had. °· Medical conditions you have, including: °¨ The possibility of pregnancy, if this applies. °¨ Have a frequent cough or coughing episodes. °What are the risks? °Generally, this is a safe procedure. However, problems may occur, including: °· Infection. °· Injury to the lung. °· Lung collapse. °· Bleeding. °What happens before the procedure? °· You may have a chest X-ray or another imaging test, such as a CT scan or ultrasound, to determine the location and amount of fluid in your pleural space. °· Ask your health care provider about: °¨ Changing or stopping your regular medicines. This is especially important if you are taking diabetes medicines or blood thinners. °¨ Taking medicines such as aspirin and ibuprofen. These medicines can thin your blood. Do not take these medicines before your procedure if your health care  provider instructs you not to. °¨ Taking a cough suppressant if you have a frequent cough or coughing episodes. °· Plan to have someone take you home after the procedure. °What happens during the procedure? °· You will be asked to sit upright and lean slightly forward for the procedure. °· An area of your back will be cleaned with a germ-killing solution (antiseptic). °· You will be given a medicine that numbs the area (local anesthetic). °· A needle will be inserted between your ribs and into the pleural space. You may feel pressure or slight pain as the needle is positioned into the pleural space. °· Fluid will be removed from the pleural space through the needle. You may feel pressure as the fluid is removed. °· The needle will be taken out after the excess fluid has been removed. A sample of the fluid may be sent to be examined. °· The needle insertion site (puncture site) will be covered with a bandage (dressing). °The procedure may vary among health care providers and hospitals. °What happens after the procedure? °· A chest X-ray may be done to check the amount of fluid that remains in your pleural space. °· Your blood pressure, heart rate, breathing rate, and blood oxygen level will be monitored often until the medicines you were given have worn off. °· It is your responsibility to obtain your test results. Ask the lab or department performing the test when and how you will get your results. Talk with your health care provider if you have any questions about your results. °This   information is not intended to replace advice given to you by your health care provider. Make sure you discuss any questions you have with your health care provider. °Document Released: 10/23/2004 Document Revised: 12/10/2015 Document Reviewed: 01/19/2014 °Elsevier Interactive Patient Education © 2017 [employer] ° °

## 2016-07-27 NOTE — Progress Notes (Signed)
Thoracentesis complete no signs of distress. 446ml serosanguinous fluid removed.

## 2016-07-27 NOTE — Procedures (Signed)
PreOperative Dx: RT pleural effusion Postoperative Dx: RT pleural effusion Procedure:   US guided RT thoracentesis Radiologist:  Thornton Papas Anesthesia:  10 ml of 1% lidocaine Specimen:  450 mL of serosanguinous colored fluid EBL:   < 1 ml Complications: None

## 2016-07-27 NOTE — Progress Notes (Signed)
Clinical Summary [name] [name] is a 76 y.o.female seen today for follow up of the following medical problems.   1. Mitral regurgitation - echo at last [facility] Jan 2017 showed normal LVEF, 3+(modarte)eccentric MR with immobile posterior leaflet.  - repeat echo 01/2016 moderate to severe MR. LVEF 60-65%. LVIDs 31. Symptoms unchanged since last visit.  - 01/2016 TEE moderate to severe eccentric MR, severe TR.   - she is s/p mitral valve repair 07/05/16, Sorin Memo 3D Ring Annuloplasty (size 14mm, catalog #[id number], serial [id number]) - no SOB or DOE. She is starting to build back up her endurance since her surgery.   2. Paroxysmal afib - occasional palpiations, about once month. Lasts for a few minutes - compliant with meds. Occasional blood with BM's, had colonoscopy fairly benign. Can have some blood on toilet paper. Stable H&H from last labs.  - xarelto was $45 a month, now up to $160  - s/p MAZE procedure during recent MV repair - no recent palpitations. Heart rates at home around 110s.   3. Hyperlipidemia - 09/2015 TC 148 TG 163 HDL 48 LDL 67 - compliant with statin  4. Pleural effusion - s/p thoracic surgery, CT surgery referred for thoracentesis.  Past Medical History:  Diagnosis Date  . Anxiety   . Arthritis   . Asthma   . Atrial fibrillation, persistent ([place])   . Chronic diastolic congestive heart failure ([place])   . Colon adenoma   . Colon cancer ([place])    status post low anterior resection, limited stage disease requiring no adjuvant therapy  . COPD (chronic obstructive pulmonary disease) ([place])   . Depression   . Diverticulosis   . DM (dermatomyositis)   . DVT (deep venous thrombosis) ([name])    in leg- long time ago  . Dyspnea    with activity  . Dysrhythmia   . Esophageal dysphagia   . GERD (gastroesophageal reflux disease)   . Headache   . Heart murmur   . Hematuria   . Hemorrhoids   . Hiatal hernia   . History of kidney stones    x 2  .  Hypercholesterolemia   . Incidental pulmonary nodule 05/08/2016   8 mm vague opacity RML noted on CT scan  . Mitral regurgitation   . PONV (postoperative nausea and vomiting) 2003 ish    with breast biopsy  . S/P minimally invasive maze operation for atrial fibrillation 07/05/2016   Complete bilateral atrial lesion set using cryothermy and bipolar radiofrequency ablation with clipping of LA appendage via right mini thoracotomy approach  . 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 Schatzki's ring   . Tricuspid regurgitation      Allergies  Allergen Reactions  . Iohexol Hives and Shortness Of Breath     Pt. had a severe allergic reaction to IV contrast the last time she was injected and had to be seen in the ER.   Marland Kitchen Hydrocodone-Acetaminophen Hives  . Nabumetone Rash  . Prednisone Rash     Current Outpatient Prescriptions  Medication Sig Dispense Refill  . acetaminophen (TYLENOL) 325 MG tablet Take 650 mg by mouth every 6 (six) hours as needed for pain.    Marland Kitchen albuterol (PROVENTIL HFA;VENTOLIN HFA) 108 (90 BASE) MCG/ACT inhaler Inhale 2 puffs into the lungs every 6 (six) hours as needed for wheezing.    Marland Kitchen  ALPRAZolam (XANAX) 0.5 MG tablet Take 0.5 mg by mouth 2 (two) times daily as needed (for anxiety/sleep (scheduled at bedtime)).     Marland Kitchen amiodarone (PACERONE) 200 MG tablet Take 1 tablet (200 mg total) by mouth daily. 30 tablet 1  . aspirin EC 81 MG EC tablet Take 1 tablet (81 mg total) by mouth daily.    . calcium carbonate (OS-CAL) 600 MG TABS tablet Take 600 mg by mouth 2 (two) times daily with a meal.    . Coenzyme Q10 100 MG TABS Take 100 mg by mouth every evening.     . docusate sodium (COLACE) 100 MG capsule Take 200 mg by mouth daily as needed for mild constipation.     . furosemide (LASIX) 40 MG tablet Take 1 tablet  (40 mg total) by mouth daily. 30 tablet 1  . hydrocortisone (ANUSOL-HC) 2.5 % rectal cream Place 1 application rectally 2 (two) times daily. FOR UP TO 10 DAYS AT A TIME (Patient taking differently: Place 1 application rectally 2 (two) times daily as needed for hemorrhoids or itching. FOR UP TO 10 DAYS AT A TIME) 30 g 1  . metoprolol succinate (TOPROL-XL) 25 MG 24 hr tablet Take 1 tablet (25 mg total) by mouth daily. 30 tablet 1  . Multiple Vitamin (MULTIVITAMIN) tablet Take 1 tablet by mouth daily.      Marland Kitchen omeprazole (PRILOSEC) 20 MG capsule Take 1 capsule (20 mg total) by mouth daily. 30 capsule 3  . PARoxetine (PAXIL) 20 MG tablet Take 20 mg by mouth daily.     Vladimir Faster Glycol-Propyl Glycol (SYSTANE ULTRA) 0.4-0.3 % SOLN Place 1 drop into both eyes 3 (three) times daily.    . potassium chloride SA (K-DUR,KLOR-CON) 20 MEQ tablet Take 1 tablet (20 mEq total) by mouth daily. 30 tablet 1  . sodium chloride (OCEAN) 0.65 % SOLN nasal spray Place 1 spray into both nostrils as needed for congestion.    . traMADol (ULTRAM) 50 MG tablet Take 50 mg by mouth every 4-6 hours PRN severe pain. 30 tablet 0  . warfarin (COUMADIN) 2.5 MG tablet Take 1 tablet (2.5 mg total) by mouth daily at 6 PM. Or as directed. 30 tablet 1   No current facility-administered medications for this visit.      Past Surgical History:  Procedure Laterality Date  . APPENDECTOMY    . BREAST SURGERY Right 2003ish   biopsy  . CARDIAC CATHETERIZATION N/A 04/12/2016   Procedure: Right/Left Heart Cath and Coronary Angiography;  Surgeon: Leonie Man, MD;  Location: [facility];  Service: Cardiovascular;  Laterality: N/A;  . CATARACT EXTRACTION Bilateral 2017  . CLIPPING OF ATRIAL APPENDAGE  07/05/2016   Procedure: CLIPPING OF ATRIAL APPENDAGE;  Surgeon: Rexene Alberts, MD;  Location: [place];  Service: Open Heart Surgery;;  . COLONOSCOPY  11/09   Dr. Gala Romney- external hemorrhoidal tags o/w normal rectal mucosa, s/p surgical  resection with a normal appearing anastomosis 12cm, pan colonic diverticulum  . COLONOSCOPY N/A 06/02/2012   HER:DEYCXK post low anterior resection. Pancolonic diverticulosis. Colonic polyp-tubular adenoma. Surveillance due 2019.   Marland Kitchen COLONOSCOPY N/A 10/13/2015   Procedure: COLONOSCOPY;  Surgeon: Daneil Dolin, MD;  [name];  Service: Endoscopy;  Laterality: N/A;  0930  . ESOPHAGOGASTRODUODENOSCOPY  07/2007   Dr. Daiva Nakayama cervical esophageal web, schatzki ring, large hiatal hernia  . INGUNAL HERNIA REPAIR Left   . LOW ANTERIOR BOWEL RESECTION     NO ADJ CHEMO  .  MINIMALLY INVASIVE MAZE PROCEDURE N/A 07/05/2016   Procedure: MINIMALLY INVASIVE MAZE PROCEDURE;  Surgeon: Rexene Alberts, MD;  Location: [place];  Service: Open Heart Surgery;  Laterality: N/A;  . MITRAL VALVE REPAIR Right 07/05/2016   Procedure: MINIMALLY INVASIVE MITRAL VALVE REPAIR (MVR);  Surgeon: Rexene Alberts, MD;  Location: [facility];  Service: Open Heart Surgery;  Laterality: Right;  . MULTIPLE EXTRACTIONS WITH ALVEOLOPLASTY N/A 05/21/2016   Procedure: MULTIPLE EXTRACTION OF TOOTH #'S 5, 21 WITH ALVEOLOPLASTY AND GROSS DEBRIDEMENT OF TEETH;  Surgeon: Lenn Cal, DDS;  Location: [facility];  Service: Oral Surgery;  Laterality: N/A;  . TEE WITHOUT CARDIOVERSION N/A 02/20/2016   Procedure: TRANSESOPHAGEAL ECHOCARDIOGRAM (TEE);  Surgeon: Arnoldo Lenis, MD;  [name];  Service: Endoscopy;  Laterality: N/A;  . TEE WITHOUT CARDIOVERSION N/A 07/05/2016   Procedure: TRANSESOPHAGEAL ECHOCARDIOGRAM (TEE);  Surgeon: Rexene Alberts, MD;  Location: [facility];  Service: Open Heart Surgery;  Laterality: N/A;  . TOOTH EXTRACTION    . TUBAL LIGATION    . VEIN LIGATION AND STRIPPING       Allergies  Allergen Reactions  . Iohexol Hives and Shortness Of Breath     Pt. had a severe allergic reaction to IV contrast the last time she was injected and had to be seen in the ER.   Marland Kitchen Hydrocodone-Acetaminophen Hives    . Nabumetone Rash  . Prednisone Rash      Family History  Problem Relation Age of Onset  . Breast cancer Mother   . Rheum arthritis Father   . Cancer - Lung Sister   . Brain cancer Sister   . Prostate cancer Brother   . Aneurysm Brother   . Colon cancer Neg Hx      Social History Ms. Salva reports that she has never smoked. She has never used smokeless tobacco. Ms. Swetz reports that she does not drink alcohol.   Review of Systems CONSTITUTIONAL: No weight loss, fever, chills, weakness or fatigue.  HEENT: Eyes: No visual loss, blurred vision, double vision or yellow sclerae.No hearing loss, sneezing, congestion, runny nose or sore throat.  SKIN: No rash or itching.  CARDIOVASCULAR: per hpi RESPIRATORY: No shortness of breath, cough or sputum.  GASTROINTESTINAL: No anorexia, nausea, vomiting or diarrhea. No abdominal pain or blood.  GENITOURINARY: No burning on urination, no polyuria NEUROLOGICAL: No headache, dizziness, syncope, paralysis, ataxia, numbness or tingling in the extremities. No change in bowel or bladder control.  MUSCULOSKELETAL: No muscle, back pain, joint pain or stiffness.  LYMPHATICS: No enlarged nodes. No history of splenectomy.  PSYCHIATRIC: No history of depression or anxiety.  ENDOCRINOLOGIC: No reports of sweating, cold or heat intolerance. No polyuria or polydipsia.  Marland Kitchen   Physical Examination Vitals:   07/27/16 0823  BP: 104/65  Pulse: (!) 110   Vitals:   07/27/16 0823  Weight: 155 lb 12.8 oz (70.7 kg)  Height: 5\' 2"  (1.575 m)    Gen: resting comfortably, no acute distress HEENT: no scleral icterus, pupils equal round and reactive, no palptable cervical adenopathy,  CV: regular, tach 110, no m/r/g, no jvd Resp: Clear to auscultation bilaterally GI: abdomen is soft, non-tender, non-distended, normal bowel sounds, no hepatosplenomegaly MSK: extremities are warm, no edema.  Skin: warm, no rash Neuro:  no focal deficits Psych:  appropriate affect   Diagnostic Studies 01/2016 echo Study Conclusions  - Left ventricle: The cavity size was normal. Wall thickness was normal. Systolic function was normal. The estimated  ejection fraction was in the range of 60% to 65%. Wall motion was normal; there were no regional wall motion abnormalities. The study is not technically sufficient to allow evaluation of LV diastolic function. Doppler parameters are consistent with high ventricular filling pressure. - Mitral valve: Calcified annulus. There was moderate to severe (closer to severe) eccentric regurgitation. - Left atrium: The atrium was severely dilated. - Right atrium: The atrium was moderately dilated. - Tricuspid valve: There was moderate-severe regurgitation. - Pulmonary arteries: PA peak pressure: 40 mm Hg (S).   01/2016 TEE eccentric moderate to severe MR. Moderate to severe TR. F/u full report for final findings.     Assessment and Plan  1. Mitral regurgitation/Mitral valve repair - s/p mitral valve repair - no recent symptoms - we will obtain echo to establish baseline s/p repair.  - refer to cardiac rehab  2. PAF - s/p MAZE procedure - currently on amio, Toprol EKG today shows junctional tachycardia - we will increase Toprl XL to 37.5mg  daily - check BMET/Mg/CBC/TSH.    3. Hyperlipidemia - she will continue current statin   F/u 6 weeks  Arnoldo Lenis, M.D.

## 2016-07-27 NOTE — Patient Instructions (Addendum)
Your physician recommends that you schedule a follow-up appointment in: [facility] has recommended you make the following change in your medication:   INCREASE METOPROLOL 37.5 MG DAILY   Your physician recommends that you return for lab work BMP/MG/CBC/TSH  You have been referred to [facility] has requested that you have an echocardiogram. Echocardiography is a painless test that uses sound waves to create images of your heart. It provides your doctor with information about the size and shape of your heart and how well your heart's chambers and valves are working. This procedure takes approximately one hour. There are no restrictions for this procedure.   Thank you for choosing [facility]!!

## 2016-07-27 NOTE — Telephone Encounter (Signed)
Echo May 2 in [place]

## 2016-07-30 DIAGNOSIS — I5033 Acute on chronic diastolic (congestive) heart failure: Secondary | ICD-10-CM | POA: Diagnosis not present

## 2016-07-30 DIAGNOSIS — Z48812 Encounter for surgical aftercare following surgery on the circulatory system: Secondary | ICD-10-CM | POA: Diagnosis not present

## 2016-07-30 DIAGNOSIS — I482 Chronic atrial fibrillation: Secondary | ICD-10-CM | POA: Diagnosis not present

## 2016-07-30 DIAGNOSIS — N390 Urinary tract infection, site not specified: Secondary | ICD-10-CM | POA: Diagnosis not present

## 2016-07-30 DIAGNOSIS — J449 Chronic obstructive pulmonary disease, unspecified: Secondary | ICD-10-CM | POA: Diagnosis not present

## 2016-07-30 DIAGNOSIS — E785 Hyperlipidemia, unspecified: Secondary | ICD-10-CM | POA: Diagnosis not present

## 2016-07-31 ENCOUNTER — Ambulatory Visit (INDEPENDENT_AMBULATORY_CARE_PROVIDER_SITE_OTHER): Payer: Medicare Other | Admitting: *Deleted

## 2016-07-31 ENCOUNTER — Telehealth: Payer: Self-pay | Admitting: *Deleted

## 2016-07-31 DIAGNOSIS — Z48812 Encounter for surgical aftercare following surgery on the circulatory system: Secondary | ICD-10-CM | POA: Diagnosis not present

## 2016-07-31 DIAGNOSIS — N390 Urinary tract infection, site not specified: Secondary | ICD-10-CM | POA: Diagnosis not present

## 2016-07-31 DIAGNOSIS — I481 Persistent atrial fibrillation: Secondary | ICD-10-CM

## 2016-07-31 DIAGNOSIS — I5033 Acute on chronic diastolic (congestive) heart failure: Secondary | ICD-10-CM | POA: Diagnosis not present

## 2016-07-31 DIAGNOSIS — I482 Chronic atrial fibrillation: Secondary | ICD-10-CM | POA: Diagnosis not present

## 2016-07-31 DIAGNOSIS — E785 Hyperlipidemia, unspecified: Secondary | ICD-10-CM | POA: Diagnosis not present

## 2016-07-31 DIAGNOSIS — J449 Chronic obstructive pulmonary disease, unspecified: Secondary | ICD-10-CM | POA: Diagnosis not present

## 2016-07-31 DIAGNOSIS — Z5181 Encounter for therapeutic drug level monitoring: Secondary | ICD-10-CM

## 2016-07-31 DIAGNOSIS — Z9889 Other specified postprocedural states: Secondary | ICD-10-CM

## 2016-07-31 DIAGNOSIS — I4819 Other persistent atrial fibrillation: Secondary | ICD-10-CM

## 2016-07-31 NOTE — Telephone Encounter (Signed)
Done.  See coumadin note. 

## 2016-07-31 NOTE — Telephone Encounter (Signed)
[name] [name] (home health) called for INR  1.6 Please call [phone]

## 2016-08-01 ENCOUNTER — Telehealth: Payer: Self-pay | Admitting: *Deleted

## 2016-08-01 ENCOUNTER — Telehealth: Payer: Self-pay | Admitting: Internal Medicine

## 2016-08-01 DIAGNOSIS — J449 Chronic obstructive pulmonary disease, unspecified: Secondary | ICD-10-CM | POA: Diagnosis not present

## 2016-08-01 DIAGNOSIS — I482 Chronic atrial fibrillation: Secondary | ICD-10-CM | POA: Diagnosis not present

## 2016-08-01 DIAGNOSIS — I5033 Acute on chronic diastolic (congestive) heart failure: Secondary | ICD-10-CM | POA: Diagnosis not present

## 2016-08-01 DIAGNOSIS — N390 Urinary tract infection, site not specified: Secondary | ICD-10-CM | POA: Diagnosis not present

## 2016-08-01 DIAGNOSIS — E785 Hyperlipidemia, unspecified: Secondary | ICD-10-CM | POA: Diagnosis not present

## 2016-08-01 DIAGNOSIS — Z48812 Encounter for surgical aftercare following surgery on the circulatory system: Secondary | ICD-10-CM | POA: Diagnosis not present

## 2016-08-01 NOTE — Telephone Encounter (Signed)
Pt voiced understanding - update medication list - will f/u on Friday - pt also made aware of lab results   Labs overall look fine   J BrancH MD

## 2016-08-01 NOTE — Telephone Encounter (Signed)
Have her increase Toprol to 25mg  bid, update [name] on heart rates Friday   [name] MD

## 2016-08-01 NOTE — Telephone Encounter (Signed)
[name] (PT) from [facility] says pt HR is 110 at rest and goes up to 120s-130s while up moving - says pt has been compliant in recent increase of Toprol 37.5 mg increase but doesn't think increase is helping with HR

## 2016-08-01 NOTE — Telephone Encounter (Signed)
PLEASE CALL PATIENT ABOUT RECTAL BLEEDING [phone]

## 2016-08-01 NOTE — Telephone Encounter (Signed)
Spoke with the pt, she is having a lot of pain and burning with her hemorrhoids. She sees a little bit of bright red blood with wiping. She is taking colace and miralax daily. The anusol cream helped a little bit but she still had the pain and burning when using it. Please advise.

## 2016-08-02 DIAGNOSIS — I482 Chronic atrial fibrillation: Secondary | ICD-10-CM | POA: Diagnosis not present

## 2016-08-02 DIAGNOSIS — I5033 Acute on chronic diastolic (congestive) heart failure: Secondary | ICD-10-CM | POA: Diagnosis not present

## 2016-08-02 DIAGNOSIS — N390 Urinary tract infection, site not specified: Secondary | ICD-10-CM | POA: Diagnosis not present

## 2016-08-02 DIAGNOSIS — E785 Hyperlipidemia, unspecified: Secondary | ICD-10-CM | POA: Diagnosis not present

## 2016-08-02 DIAGNOSIS — J449 Chronic obstructive pulmonary disease, unspecified: Secondary | ICD-10-CM | POA: Diagnosis not present

## 2016-08-02 DIAGNOSIS — Z48812 Encounter for surgical aftercare following surgery on the circulatory system: Secondary | ICD-10-CM | POA: Diagnosis not present

## 2016-08-02 NOTE — Telephone Encounter (Signed)
Is the pain feel like it's externam anus or more internal? Trying to decide if suppositories may help versus needing an OV.

## 2016-08-02 NOTE — Telephone Encounter (Signed)
Tried to call pt- NA- LMOM 

## 2016-08-03 ENCOUNTER — Telehealth: Payer: Self-pay | Admitting: Cardiology

## 2016-08-03 NOTE — Telephone Encounter (Signed)
Pt voices understanding - scheduled nurse visit for next week.

## 2016-08-03 NOTE — Telephone Encounter (Signed)
The pain is external, she said the hemorrhoids are sticking out.

## 2016-08-03 NOTE — Telephone Encounter (Signed)
[name] [name] called to report her BP for today.  104/69  HR 108

## 2016-08-03 NOTE — Telephone Encounter (Signed)
Call and talked with the patient. She has been having soft/normal stools but going frequently. On colace. Anusol has helps. Recommended fiber supplement to help coalesce stools and prevent constipation. Continue Anusol. Use a sitz bath.  Please call and check on patient on Monday. If no better, we can schedule a follow-up appointment.

## 2016-08-03 NOTE — Telephone Encounter (Signed)
Have her increase amiodarone to 200mg  bid x 2 weeks, then resume 200mg  daily. Can she come in mid next week for vitals check and EKG nursing visit   J Holliday Sheaffer MD

## 2016-08-06 ENCOUNTER — Ambulatory Visit (INDEPENDENT_AMBULATORY_CARE_PROVIDER_SITE_OTHER): Payer: Medicare Other | Admitting: *Deleted

## 2016-08-06 ENCOUNTER — Telehealth: Payer: Self-pay | Admitting: *Deleted

## 2016-08-06 ENCOUNTER — Ambulatory Visit (INDEPENDENT_AMBULATORY_CARE_PROVIDER_SITE_OTHER): Payer: Medicare Other

## 2016-08-06 VITALS — BP 116/78 | HR 97

## 2016-08-06 DIAGNOSIS — I4819 Other persistent atrial fibrillation: Secondary | ICD-10-CM

## 2016-08-06 DIAGNOSIS — I481 Persistent atrial fibrillation: Secondary | ICD-10-CM | POA: Diagnosis not present

## 2016-08-06 DIAGNOSIS — Z9889 Other specified postprocedural states: Secondary | ICD-10-CM

## 2016-08-06 DIAGNOSIS — I482 Chronic atrial fibrillation: Secondary | ICD-10-CM | POA: Diagnosis not present

## 2016-08-06 DIAGNOSIS — E785 Hyperlipidemia, unspecified: Secondary | ICD-10-CM | POA: Diagnosis not present

## 2016-08-06 DIAGNOSIS — J449 Chronic obstructive pulmonary disease, unspecified: Secondary | ICD-10-CM | POA: Diagnosis not present

## 2016-08-06 DIAGNOSIS — I5033 Acute on chronic diastolic (congestive) heart failure: Secondary | ICD-10-CM | POA: Diagnosis not present

## 2016-08-06 DIAGNOSIS — Z5181 Encounter for therapeutic drug level monitoring: Secondary | ICD-10-CM

## 2016-08-06 DIAGNOSIS — Z48812 Encounter for surgical aftercare following surgery on the circulatory system: Secondary | ICD-10-CM | POA: Diagnosis not present

## 2016-08-06 DIAGNOSIS — N390 Urinary tract infection, site not specified: Secondary | ICD-10-CM | POA: Diagnosis not present

## 2016-08-06 LAB — POCT INR: INR: 2.7

## 2016-08-06 NOTE — Telephone Encounter (Signed)
Done.  See coumadin note. 

## 2016-08-06 NOTE — Progress Notes (Signed)
Patient came into office today complaining of shortness of breath and feeling of heart racing. Patient has noticed no swelling or any other symptoms. Patient states she feels this way most of the time. Patient stated she felt like she probably could have waited to come into the office because she was seeming to feel better. EKG obtained and in chart.

## 2016-08-06 NOTE — Telephone Encounter (Signed)
Tried to call pt- phone rang multiple times, no answer. Did not get voicemail.

## 2016-08-06 NOTE — Telephone Encounter (Signed)
INR  2.7  Per [facility]    [phone]

## 2016-08-07 ENCOUNTER — Telehealth: Payer: Self-pay

## 2016-08-07 NOTE — Telephone Encounter (Signed)
-----   Message from Arnoldo Lenis, MD sent at 08/07/2016 11:38 AM EDT -----   ----- Message ----- From: Acquanetta Chain, LPN Sent: 0/14/8403   9:44 AM To: Arnoldo Lenis, MD

## 2016-08-07 NOTE — Progress Notes (Signed)
EKG shows she is back in afib, heart rates are improving though. Have her increase amiodarone to 400mg  bid x 1 week, then resume the previously planned 200mg  bid. She should see me back in 2-3 weeks  [name] MD

## 2016-08-07 NOTE — Telephone Encounter (Signed)
Patient notified. Apt scheduled for 08/27/16 at 1:20 pm

## 2016-08-07 NOTE — Telephone Encounter (Signed)
Tried to call the pt, NA did not get voicemail.

## 2016-08-07 NOTE — Telephone Encounter (Signed)
Message sent in my chart

## 2016-08-08 ENCOUNTER — Telehealth: Payer: Self-pay | Admitting: Cardiology

## 2016-08-08 ENCOUNTER — Other Ambulatory Visit: Payer: Self-pay | Admitting: Physician Assistant

## 2016-08-08 DIAGNOSIS — I482 Chronic atrial fibrillation: Secondary | ICD-10-CM | POA: Diagnosis not present

## 2016-08-08 DIAGNOSIS — N390 Urinary tract infection, site not specified: Secondary | ICD-10-CM | POA: Diagnosis not present

## 2016-08-08 DIAGNOSIS — I5033 Acute on chronic diastolic (congestive) heart failure: Secondary | ICD-10-CM | POA: Diagnosis not present

## 2016-08-08 DIAGNOSIS — J449 Chronic obstructive pulmonary disease, unspecified: Secondary | ICD-10-CM | POA: Diagnosis not present

## 2016-08-08 DIAGNOSIS — E785 Hyperlipidemia, unspecified: Secondary | ICD-10-CM | POA: Diagnosis not present

## 2016-08-08 DIAGNOSIS — Z48812 Encounter for surgical aftercare following surgery on the circulatory system: Secondary | ICD-10-CM | POA: Diagnosis not present

## 2016-08-08 NOTE — Telephone Encounter (Signed)
[name] [name] called in regards to questions about how she is suppose to be taking  Amiodarone.

## 2016-08-09 ENCOUNTER — Telehealth: Payer: Self-pay | Admitting: *Deleted

## 2016-08-09 DIAGNOSIS — E785 Hyperlipidemia, unspecified: Secondary | ICD-10-CM | POA: Diagnosis not present

## 2016-08-09 DIAGNOSIS — Z48812 Encounter for surgical aftercare following surgery on the circulatory system: Secondary | ICD-10-CM | POA: Diagnosis not present

## 2016-08-09 DIAGNOSIS — J449 Chronic obstructive pulmonary disease, unspecified: Secondary | ICD-10-CM | POA: Diagnosis not present

## 2016-08-09 DIAGNOSIS — N390 Urinary tract infection, site not specified: Secondary | ICD-10-CM | POA: Diagnosis not present

## 2016-08-09 DIAGNOSIS — I5033 Acute on chronic diastolic (congestive) heart failure: Secondary | ICD-10-CM | POA: Diagnosis not present

## 2016-08-09 DIAGNOSIS — I482 Chronic atrial fibrillation: Secondary | ICD-10-CM | POA: Diagnosis not present

## 2016-08-09 MED ORDER — AMIODARONE HCL 200 MG PO TABS: ORAL_TABLET | ORAL | 1 refills | Status: DC

## 2016-08-09 NOTE — Telephone Encounter (Signed)
Bethena Roys with [facility] says pt has been confused on medications and Bethena Roys was helping with medication management - confirmed pt has only been taking 200 mg bid of amio since 4/16 nurse visit - per 4/16 nurse visit pt was to take 400 mg bid X 1 week then return to amio 200 mg bid - how do you want pt to take amio moving forward

## 2016-08-09 NOTE — Telephone Encounter (Signed)
400mg  bid x 1 week, then 224md bid until follow up which should be within 2-3 weeks   [name] MD

## 2016-08-09 NOTE — Telephone Encounter (Signed)
Pt wanted clarification on amio directions - per nurse visit/Dr Branch on 4/11 pt was to take 400 mg bid for 1 week then resume 200 mg bid - pt also confirmed upcoming echo and f/u with Dr [name]

## 2016-08-09 NOTE — Telephone Encounter (Signed)
LM on confidential VM for Bethena Roys with [facility] as requested with amio instructions. Sent directions to pharmacy

## 2016-08-10 NOTE — Telephone Encounter (Signed)
Pt called- left voicemail, she said to let Randall Hiss know that she was feeling better.

## 2016-08-10 NOTE — Telephone Encounter (Signed)
Noted, no further recommendations at this time. 

## 2016-08-13 ENCOUNTER — Other Ambulatory Visit: Payer: Self-pay | Admitting: Thoracic Surgery (Cardiothoracic Vascular Surgery)

## 2016-08-13 ENCOUNTER — Telehealth: Payer: Self-pay | Admitting: *Deleted

## 2016-08-13 DIAGNOSIS — N390 Urinary tract infection, site not specified: Secondary | ICD-10-CM | POA: Diagnosis not present

## 2016-08-13 DIAGNOSIS — I482 Chronic atrial fibrillation: Secondary | ICD-10-CM | POA: Diagnosis not present

## 2016-08-13 DIAGNOSIS — I5033 Acute on chronic diastolic (congestive) heart failure: Secondary | ICD-10-CM | POA: Diagnosis not present

## 2016-08-13 DIAGNOSIS — Z48812 Encounter for surgical aftercare following surgery on the circulatory system: Secondary | ICD-10-CM | POA: Diagnosis not present

## 2016-08-13 DIAGNOSIS — I340 Nonrheumatic mitral (valve) insufficiency: Secondary | ICD-10-CM

## 2016-08-13 DIAGNOSIS — J449 Chronic obstructive pulmonary disease, unspecified: Secondary | ICD-10-CM | POA: Diagnosis not present

## 2016-08-13 DIAGNOSIS — E785 Hyperlipidemia, unspecified: Secondary | ICD-10-CM | POA: Diagnosis not present

## 2016-08-13 NOTE — Telephone Encounter (Signed)
Debbie from [facility] called with update - says pt HR remained at 48 with activity and at rest. Says she can walk very slow and respirations went from 17 to 30 with activity - wanting to know if pt should remain on home health until upcoming echo and f/u appts or to discharge her from home health - says she is compliant and understood medications and can walk/bathe/drive alone - says she is to start cardiac rehab in May

## 2016-08-13 NOTE — Telephone Encounter (Signed)
LM on [name] [name] w/ [facility] VM to continue home health

## 2016-08-13 NOTE — Telephone Encounter (Signed)
Would like to continue home health at least until further follow up   [name] MD

## 2016-08-14 ENCOUNTER — Ambulatory Visit
Admission: RE | Admit: 2016-08-14 | Discharge: 2016-08-14 | Disposition: A | Discharge status: Home / Self Care | Payer: Medicare Other | Source: Ambulatory Visit | Attending: Thoracic Surgery (Cardiothoracic Vascular Surgery) | Admitting: Thoracic Surgery (Cardiothoracic Vascular Surgery)

## 2016-08-14 ENCOUNTER — Ambulatory Visit (INDEPENDENT_AMBULATORY_CARE_PROVIDER_SITE_OTHER): Payer: Self-pay | Admitting: Thoracic Surgery (Cardiothoracic Vascular Surgery)

## 2016-08-14 ENCOUNTER — Encounter: Payer: Self-pay | Admitting: Thoracic Surgery (Cardiothoracic Vascular Surgery)

## 2016-08-14 VITALS — BP 116/74 | HR 90 | Resp 20 | Ht 62.0 in | Wt 152.0 lb

## 2016-08-14 DIAGNOSIS — I361 Nonrheumatic tricuspid (valve) insufficiency: Secondary | ICD-10-CM

## 2016-08-14 DIAGNOSIS — I340 Nonrheumatic mitral (valve) insufficiency: Secondary | ICD-10-CM

## 2016-08-14 DIAGNOSIS — I482 Chronic atrial fibrillation: Secondary | ICD-10-CM | POA: Diagnosis not present

## 2016-08-14 DIAGNOSIS — J449 Chronic obstructive pulmonary disease, unspecified: Secondary | ICD-10-CM | POA: Diagnosis not present

## 2016-08-14 DIAGNOSIS — Z48812 Encounter for surgical aftercare following surgery on the circulatory system: Secondary | ICD-10-CM | POA: Diagnosis not present

## 2016-08-14 DIAGNOSIS — R0602 Shortness of breath: Secondary | ICD-10-CM | POA: Diagnosis not present

## 2016-08-14 DIAGNOSIS — Z8679 Personal history of other diseases of the circulatory system: Secondary | ICD-10-CM

## 2016-08-14 DIAGNOSIS — N390 Urinary tract infection, site not specified: Secondary | ICD-10-CM | POA: Diagnosis not present

## 2016-08-14 DIAGNOSIS — Z9889 Other specified postprocedural states: Secondary | ICD-10-CM

## 2016-08-14 DIAGNOSIS — I5033 Acute on chronic diastolic (congestive) heart failure: Secondary | ICD-10-CM | POA: Diagnosis not present

## 2016-08-14 DIAGNOSIS — E785 Hyperlipidemia, unspecified: Secondary | ICD-10-CM | POA: Diagnosis not present

## 2016-08-14 MED ORDER — AMIODARONE HCL 200 MG PO TABS: 200.0000 mg | ORAL_TABLET | Freq: Every day | ORAL | 1 refills | Status: DC

## 2016-08-14 NOTE — Progress Notes (Signed)
[address]       [place] [address]             [phone]     CARDIOTHORACIC SURGERY OFFICE NOTE  Referring Provider is Branch, Alphonse Guild, MD PCP is Glenda Chroman, MD   HPI:  Patient is a 76 year old female with multiple medical problems who returns to the office today for routine follow-up status post minimally invasive mitral valve repair and Maze procedure on 07/05/2016 for mitral valve prolapse with severe symptomatic primary mitral regurgitation and long-standing persistent atrial fibrillation.  Her early postoperative recovery in the hospital was notable for the development of a Klebsiella urinary tract infection for which she was treated with oral ciprofloxacin. She was ultimately discharged from the hospital on the ninth postoperative day.  She was last seen in our office on 07/23/2016 at which time she was noted to have a moderate size right pleural effusion. She subsequently underwent ultrasound-guided thoracentesis yielding 450 mL of serosanguineous fluid. She has also been seen in follow-up by Dr. [name] who noted that the patient appeared to be in junctional tachycardia and increased her dose of Toprol-XL and amiodarone. She has been scheduled for routine follow-up echocardiogram that reportedly is planned for next week. Her Coumadin dose has been adjusted through the Coumadin clinic and her most recent INR was therapeutic at 2.7 on 08/06/2016. She returns to our office for routine follow-up today. She reports that she is doing fairly well. She still gets some degree of shortness of breath with activity, but she notes that this is better than it was prior to surgery and she no longer has any pain or soreness in her chest at all. She denies any fevers chills or productive cough. She complains of some nausea and she seems that this has been worse since her dose of amiodarone was increased. She also complains that she has had some minor bleeding from her  hemorrhoids.   Current Outpatient Prescriptions  Medication Sig Dispense Refill  . acetaminophen (TYLENOL) 325 MG tablet Take 650 mg by mouth every 6 (six) hours as needed for pain.    Marland Kitchen albuterol (PROVENTIL HFA;VENTOLIN HFA) 108 (90 BASE) MCG/ACT inhaler Inhale 2 puffs into the lungs every 6 (six) hours as needed for wheezing.    Marland Kitchen ALPRAZolam (XANAX) 0.5 MG tablet Take 0.5 mg by mouth 2 (two) times daily as needed (for anxiety/sleep (scheduled at bedtime)).     Marland Kitchen amiodarone (PACERONE) 200 MG tablet TAKE 400 MG TWICE DAILY FOR 1 WEEK THEN TAKE 200 MG TWICE DAILY 180 tablet 1  . aspirin EC 81 MG EC tablet Take 1 tablet (81 mg total) by mouth daily.    . calcium carbonate (OS-CAL) 600 MG TABS tablet Take 600 mg by mouth 2 (two) times daily with a meal.    . Coenzyme Q10 100 MG TABS Take 100 mg by mouth every evening.     . docusate sodium (COLACE) 100 MG capsule Take 200 mg by mouth daily as needed for mild constipation.     . furosemide (LASIX) 40 MG tablet Take 1 tablet (40 mg total) by mouth daily. 30 tablet 1  . hydrocortisone (ANUSOL-HC) 2.5 % rectal cream Place 1 application rectally 2 (two) times daily. FOR UP TO 10 DAYS AT A TIME (Patient taking differently: Place 1 application rectally 2 (two) times daily as needed for hemorrhoids or itching. FOR UP TO 10 DAYS AT A TIME) 30 g 1  .  metoprolol succinate (TOPROL-XL) 25 MG 24 hr tablet Take 25 mg by mouth 2 (two) times daily.    . Multiple Vitamin (MULTIVITAMIN) tablet Take 1 tablet by mouth daily.      Marland Kitchen omeprazole (PRILOSEC) 20 MG capsule Take 1 capsule (20 mg total) by mouth daily. 30 capsule 3  . PARoxetine (PAXIL) 20 MG tablet Take 20 mg by mouth daily.     Vladimir Faster Glycol-Propyl Glycol (SYSTANE ULTRA) 0.4-0.3 % SOLN Place 1 drop into both eyes 3 (three) times daily.    . potassium chloride SA (K-DUR,KLOR-CON) 20 MEQ tablet Take 1 tablet (20 mEq total) by mouth daily. 30 tablet 1  . sodium chloride (OCEAN) 0.65 % SOLN nasal spray  Place 1 spray into both nostrils as needed for congestion.    . traMADol (ULTRAM) 50 MG tablet Take 50 mg by mouth every 4-6 hours PRN severe pain. 30 tablet 0  . warfarin (COUMADIN) 2.5 MG tablet Take 1 tablet (2.5 mg total) by mouth daily at 6 PM. Or as directed. 30 tablet 1   No current facility-administered medications for this visit.       Physical Exam:   BP 116/74   Pulse 90   Resp 20   Ht 5\' 2"  (1.575 m)   Wt 152 lb (68.9 kg)   SpO2 98% Comment: RA  BMI 27.80 kg/m   General:  Well-appearing  Chest:   Clear to auscultation with symmetrical breath sounds  CV:   Regular rate and rhythm without murmur  Incisions:  Healing nicely  Abdomen:  Soft nontender  Extremities:  Warm and well-perfused, no edema  Diagnostic Tests:  2 channel telemetry rhythm strip performed in our office today demonstrates what appears to be junctional rhythm or sinus rhythm, heart rate 90s   CHEST  2 VIEW  COMPARISON:  07/27/2016.  FINDINGS: Prior cardiac valve replacement. Left atrial appendage clip. Cardiomegaly with mild pulmonary vascular prominence and bilateral interstitial prominence. Small bilateral pleural effusions. Findings consistent CHF. Findings have improved from prior study 07/27/2016. No pneumothorax. Sliding hiatal hernia again noted.  IMPRESSION: 1. Prior cardiac valve replacement. Findings consistent with mild CHF. Findings of CHF have improved from prior exam.  2. Sliding hiatal hernia.   Electronically Signed   By: Marcello Moores  Register   On: 08/14/2016 15:53    Impression:  Patient is doing well more than 1 month status post minimally invasive mitral valve repair and Maze procedure. She appears to be maintaining a stable narrow complex rhythm consistent with junctional rhythm, ectopic atrial rhythm, or sinus rhythm without definite discernible P waves. Follow-up chest x-ray looks good.  The patient complains of some increased nausea since her amiodarone dose  was increased recently.    Plan:  I have instructed the patient to cut her dose of amiodarone back to 200 mg daily. I would favor stopping it in 1-2 months if her rhythm remained stable. We have otherwise not recommended any changes to her current medications. I've encouraged the patient to participate in outpatient cardiac rehabilitation program. She will return to our office for routine follow-up and rhythm check in approximately 2 months. During the interim period time she will continue to follow-up with Dr. [name]. All of her questions have been addressed.    Valentina Gu. Roxy Manns, MD 08/14/2016 4:05 PM

## 2016-08-14 NOTE — Patient Instructions (Signed)
Decrease your dose of amiodarone to 200 mg daily  You may continue to gradually increase your physical activity as tolerated.  Refrain from any heavy lifting or strenuous use of your arms and shoulders until at least 8 weeks from the time of your surgery, and avoid activities that cause increased pain in your chest on the side of your surgical incision.  Otherwise you may continue to increase activities without any particular limitations.  Increase the intensity and duration of physical activity gradually.  You may return to driving an automobile as long as you are no longer requiring oral narcotic pain relievers during the daytime.  It would be wise to start driving only short distances during the daylight and gradually increase from there as you feel comfortable.  You are encouraged to enroll and participate in the outpatient cardiac rehab program beginning as soon as practical.  Endocarditis is a potentially serious infection of heart valves or inside lining of the heart.  It occurs more commonly in patients with diseased heart valves (such as patient's with aortic or mitral valve disease) and in patients who have undergone heart valve repair or replacement.  Certain surgical and dental procedures may put you at risk, such as dental cleaning, other dental procedures, or any surgery involving the respiratory, urinary, gastrointestinal tract, gallbladder or prostate gland.   To minimize your chances for develooping endocarditis, maintain good oral health and seek prompt medical attention for any infections involving the mouth, teeth, gums, skin or urinary tract.    Always notify your doctor or dentist about your underlying heart valve condition before having any invasive procedures. You will need to take antibiotics before certain procedures, including all routine dental cleanings or other dental procedures.  Your cardiologist or dentist should prescribe these antibiotics for you to be taken ahead of  time.

## 2016-08-15 ENCOUNTER — Telehealth: Payer: Self-pay | Admitting: Cardiology

## 2016-08-15 ENCOUNTER — Ambulatory Visit (INDEPENDENT_AMBULATORY_CARE_PROVIDER_SITE_OTHER): Payer: Medicare Other | Admitting: *Deleted

## 2016-08-15 DIAGNOSIS — I4819 Other persistent atrial fibrillation: Secondary | ICD-10-CM

## 2016-08-15 DIAGNOSIS — Z9889 Other specified postprocedural states: Secondary | ICD-10-CM

## 2016-08-15 DIAGNOSIS — I482 Chronic atrial fibrillation: Secondary | ICD-10-CM | POA: Diagnosis not present

## 2016-08-15 DIAGNOSIS — I5033 Acute on chronic diastolic (congestive) heart failure: Secondary | ICD-10-CM | POA: Diagnosis not present

## 2016-08-15 DIAGNOSIS — N390 Urinary tract infection, site not specified: Secondary | ICD-10-CM | POA: Diagnosis not present

## 2016-08-15 DIAGNOSIS — I481 Persistent atrial fibrillation: Secondary | ICD-10-CM

## 2016-08-15 DIAGNOSIS — Z48812 Encounter for surgical aftercare following surgery on the circulatory system: Secondary | ICD-10-CM | POA: Diagnosis not present

## 2016-08-15 DIAGNOSIS — E785 Hyperlipidemia, unspecified: Secondary | ICD-10-CM | POA: Diagnosis not present

## 2016-08-15 DIAGNOSIS — Z5181 Encounter for therapeutic drug level monitoring: Secondary | ICD-10-CM

## 2016-08-15 DIAGNOSIS — J449 Chronic obstructive pulmonary disease, unspecified: Secondary | ICD-10-CM | POA: Diagnosis not present

## 2016-08-15 LAB — POCT INR: INR: 2.3

## 2016-08-15 NOTE — Telephone Encounter (Signed)
LM with Jackelyn Poling

## 2016-08-15 NOTE — Telephone Encounter (Signed)
Im ok with change. Looks like higher dose may have caused some nausea, and her heart rates look to be better  J Ferguson Gertner MD

## 2016-08-15 NOTE — Telephone Encounter (Signed)
Done.  See coumadin note. 

## 2016-08-15 NOTE — Telephone Encounter (Signed)
inr 2.3

## 2016-08-15 NOTE — Telephone Encounter (Signed)
[name] [name] w.[facility] called to state that Dr Roxy Manns changed her dose to Take 1 tablet (200 mg total) by mouth daily. TAKE 200 MG DAILY - Oral amiodarone (PACERONE) 200 MG tablet   Heart Rate was today 80

## 2016-08-16 DIAGNOSIS — I5033 Acute on chronic diastolic (congestive) heart failure: Secondary | ICD-10-CM | POA: Diagnosis not present

## 2016-08-16 DIAGNOSIS — N390 Urinary tract infection, site not specified: Secondary | ICD-10-CM | POA: Diagnosis not present

## 2016-08-16 DIAGNOSIS — I482 Chronic atrial fibrillation: Secondary | ICD-10-CM | POA: Diagnosis not present

## 2016-08-16 DIAGNOSIS — E785 Hyperlipidemia, unspecified: Secondary | ICD-10-CM | POA: Diagnosis not present

## 2016-08-16 DIAGNOSIS — Z48812 Encounter for surgical aftercare following surgery on the circulatory system: Secondary | ICD-10-CM | POA: Diagnosis not present

## 2016-08-16 DIAGNOSIS — J449 Chronic obstructive pulmonary disease, unspecified: Secondary | ICD-10-CM | POA: Diagnosis not present

## 2016-08-20 DIAGNOSIS — Z48812 Encounter for surgical aftercare following surgery on the circulatory system: Secondary | ICD-10-CM | POA: Diagnosis not present

## 2016-08-20 DIAGNOSIS — J449 Chronic obstructive pulmonary disease, unspecified: Secondary | ICD-10-CM | POA: Diagnosis not present

## 2016-08-20 DIAGNOSIS — I5033 Acute on chronic diastolic (congestive) heart failure: Secondary | ICD-10-CM | POA: Diagnosis not present

## 2016-08-20 DIAGNOSIS — I482 Chronic atrial fibrillation: Secondary | ICD-10-CM | POA: Diagnosis not present

## 2016-08-20 DIAGNOSIS — E785 Hyperlipidemia, unspecified: Secondary | ICD-10-CM | POA: Diagnosis not present

## 2016-08-20 DIAGNOSIS — N390 Urinary tract infection, site not specified: Secondary | ICD-10-CM | POA: Diagnosis not present

## 2016-08-22 ENCOUNTER — Ambulatory Visit (INDEPENDENT_AMBULATORY_CARE_PROVIDER_SITE_OTHER): Payer: Medicare Other

## 2016-08-22 ENCOUNTER — Ambulatory Visit (INDEPENDENT_AMBULATORY_CARE_PROVIDER_SITE_OTHER): Payer: Medicare Other | Admitting: *Deleted

## 2016-08-22 ENCOUNTER — Telehealth: Payer: Self-pay | Admitting: Cardiology

## 2016-08-22 ENCOUNTER — Other Ambulatory Visit: Payer: Self-pay

## 2016-08-22 DIAGNOSIS — I340 Nonrheumatic mitral (valve) insufficiency: Secondary | ICD-10-CM | POA: Diagnosis not present

## 2016-08-22 DIAGNOSIS — N390 Urinary tract infection, site not specified: Secondary | ICD-10-CM | POA: Diagnosis not present

## 2016-08-22 DIAGNOSIS — I4819 Other persistent atrial fibrillation: Secondary | ICD-10-CM

## 2016-08-22 DIAGNOSIS — E785 Hyperlipidemia, unspecified: Secondary | ICD-10-CM | POA: Diagnosis not present

## 2016-08-22 DIAGNOSIS — Z9889 Other specified postprocedural states: Secondary | ICD-10-CM

## 2016-08-22 DIAGNOSIS — Z5181 Encounter for therapeutic drug level monitoring: Secondary | ICD-10-CM

## 2016-08-22 DIAGNOSIS — J449 Chronic obstructive pulmonary disease, unspecified: Secondary | ICD-10-CM | POA: Diagnosis not present

## 2016-08-22 DIAGNOSIS — I5033 Acute on chronic diastolic (congestive) heart failure: Secondary | ICD-10-CM | POA: Diagnosis not present

## 2016-08-22 DIAGNOSIS — I481 Persistent atrial fibrillation: Secondary | ICD-10-CM

## 2016-08-22 DIAGNOSIS — I482 Chronic atrial fibrillation: Secondary | ICD-10-CM | POA: Diagnosis not present

## 2016-08-22 DIAGNOSIS — Z48812 Encounter for surgical aftercare following surgery on the circulatory system: Secondary | ICD-10-CM | POA: Diagnosis not present

## 2016-08-22 LAB — POCT INR: INR: 2.8

## 2016-08-22 NOTE — Telephone Encounter (Signed)
Done.  See coumadin note. 

## 2016-08-22 NOTE — Telephone Encounter (Signed)
RESULTS  2.8  DEBBIE   [phone]

## 2016-08-23 DIAGNOSIS — E785 Hyperlipidemia, unspecified: Secondary | ICD-10-CM | POA: Diagnosis not present

## 2016-08-23 DIAGNOSIS — J449 Chronic obstructive pulmonary disease, unspecified: Secondary | ICD-10-CM | POA: Diagnosis not present

## 2016-08-23 DIAGNOSIS — I5033 Acute on chronic diastolic (congestive) heart failure: Secondary | ICD-10-CM | POA: Diagnosis not present

## 2016-08-23 DIAGNOSIS — I482 Chronic atrial fibrillation: Secondary | ICD-10-CM | POA: Diagnosis not present

## 2016-08-23 DIAGNOSIS — N390 Urinary tract infection, site not specified: Secondary | ICD-10-CM | POA: Diagnosis not present

## 2016-08-23 DIAGNOSIS — Z48812 Encounter for surgical aftercare following surgery on the circulatory system: Secondary | ICD-10-CM | POA: Diagnosis not present

## 2016-08-27 ENCOUNTER — Ambulatory Visit (INDEPENDENT_AMBULATORY_CARE_PROVIDER_SITE_OTHER): Payer: Medicare Other | Admitting: Cardiology

## 2016-08-27 ENCOUNTER — Telehealth: Payer: Self-pay | Admitting: *Deleted

## 2016-08-27 ENCOUNTER — Encounter: Payer: Self-pay | Admitting: Cardiology

## 2016-08-27 VITALS — BP 100/61 | HR 74 | Ht 62.0 in | Wt 150.6 lb

## 2016-08-27 DIAGNOSIS — I482 Chronic atrial fibrillation: Secondary | ICD-10-CM | POA: Diagnosis not present

## 2016-08-27 DIAGNOSIS — I4819 Other persistent atrial fibrillation: Secondary | ICD-10-CM

## 2016-08-27 DIAGNOSIS — Z48812 Encounter for surgical aftercare following surgery on the circulatory system: Secondary | ICD-10-CM | POA: Diagnosis not present

## 2016-08-27 DIAGNOSIS — N390 Urinary tract infection, site not specified: Secondary | ICD-10-CM | POA: Diagnosis not present

## 2016-08-27 DIAGNOSIS — J449 Chronic obstructive pulmonary disease, unspecified: Secondary | ICD-10-CM | POA: Diagnosis not present

## 2016-08-27 DIAGNOSIS — Z9889 Other specified postprocedural states: Secondary | ICD-10-CM

## 2016-08-27 DIAGNOSIS — I5033 Acute on chronic diastolic (congestive) heart failure: Secondary | ICD-10-CM | POA: Diagnosis not present

## 2016-08-27 DIAGNOSIS — E785 Hyperlipidemia, unspecified: Secondary | ICD-10-CM | POA: Diagnosis not present

## 2016-08-27 DIAGNOSIS — I481 Persistent atrial fibrillation: Secondary | ICD-10-CM

## 2016-08-27 MED ORDER — TIOTROPIUM BROMIDE MONOHYDRATE 18 MCG IN CAPS: 18.0000 ug | ORAL_CAPSULE | Freq: Every day | RESPIRATORY_TRACT | 6 refills | Status: DC

## 2016-08-27 NOTE — Progress Notes (Signed)
Clinical Summary [name] [name] is a 76 y.o.female seen today for follow up of the following medical problems.     1. Mitral regurgitation - echo at last [facility] Jan 2017 showed normal LVEF, 3+(modarte)eccentric MR with immobile posterior leaflet.  - repeat echo 01/2016 moderate to severe MR. LVEF 60-65%. LVIDs 31. Symptoms unchanged since last visit.  - 01/2016 TEE moderate to severe eccentric MR, severe TR.  - she is s/p mitral valve repair 07/05/16, Sorin Memo 3D Ring Annuloplasty (size 54mm, catalog #[id number], serial [id number])   - has had some SOB. Worst with humid/heat. No recent edema   2. Paroxysmal afib - occasional palpiations, about once month. Lasts for a few minutes - s/p MAZE procedure during recent MV repair - no recent palpitations. Heart rates at home around 110s.    - last visit accelerated junctional rhythm, amio and beta blocker were increased - had nasuea on higer amio doses, decreased dose - no recent palpitations.    3. COPD - better with rescue inhaler.  - worst with warmer temperatures, increased SOB over the last few weeks Past Medical History:  Diagnosis Date  . Anxiety   . Arthritis   . Asthma   . Atrial fibrillation, persistent ([place])   . Chronic diastolic congestive heart failure ([place])   . Colon adenoma   . Colon cancer ([place])    status post low anterior resection, limited stage disease requiring no adjuvant therapy  . COPD (chronic obstructive pulmonary disease) ([place])   . Depression   . Diverticulosis   . DM (dermatomyositis)   . DVT (deep venous thrombosis) ([name])    in leg- long time ago  . Dyspnea    with activity  . Dysrhythmia   . Esophageal dysphagia   . GERD (gastroesophageal reflux disease)   . Headache   . Heart murmur   . Hematuria   . Hemorrhoids   . Hiatal hernia   . History of kidney stones    x 2  . Hypercholesterolemia   . Incidental pulmonary nodule 05/08/2016   8 mm vague opacity RML noted on CT scan  .  Mitral regurgitation   . PONV (postoperative nausea and vomiting) 2003 ish    with breast biopsy  . S/P minimally invasive maze operation for atrial fibrillation 07/05/2016   Complete bilateral atrial lesion set using cryothermy and bipolar radiofrequency ablation with clipping of LA appendage via right mini thoracotomy approach  . 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 Schatzki's ring   . Tricuspid regurgitation      Allergies  Allergen Reactions  . Iohexol Hives and Shortness Of Breath     Pt. had a severe allergic reaction to IV contrast the last time she was injected and had to be seen in the ER.   Marland Kitchen Hydrocodone-Acetaminophen Hives  . Nabumetone Rash  . Prednisone Rash     Current Outpatient Prescriptions  Medication Sig Dispense Refill  . acetaminophen (TYLENOL) 325 MG tablet Take 650 mg by mouth every 6 (six) hours as needed for pain.    Marland Kitchen albuterol (PROVENTIL HFA;VENTOLIN HFA) 108 (90 BASE) MCG/ACT inhaler Inhale 2 puffs into the lungs every 6 (six) hours as needed for wheezing.    Marland Kitchen ALPRAZolam (XANAX) 0.5 MG tablet Take 0.5 mg by mouth 2 (two) times daily as needed (for anxiety/sleep (scheduled  at bedtime)).     Marland Kitchen amiodarone (PACERONE) 200 MG tablet Take 1 tablet (200 mg total) by mouth daily. TAKE 200 MG DAILY 180 tablet 1  . aspirin EC 81 MG EC tablet Take 1 tablet (81 mg total) by mouth daily.    . calcium carbonate (OS-CAL) 600 MG TABS tablet Take 600 mg by mouth 2 (two) times daily with a meal.    . Coenzyme Q10 100 MG TABS Take 100 mg by mouth every evening.     . docusate sodium (COLACE) 100 MG capsule Take 200 mg by mouth daily as needed for mild constipation.     . furosemide (LASIX) 40 MG tablet Take 1 tablet (40 mg total) by mouth daily. 30 tablet 1  . hydrocortisone (ANUSOL-HC) 2.5 % rectal cream  Place 1 application rectally 2 (two) times daily. FOR UP TO 10 DAYS AT A TIME (Patient taking differently: Place 1 application rectally 2 (two) times daily as needed for hemorrhoids or itching. FOR UP TO 10 DAYS AT A TIME) 30 g 1  . metoprolol succinate (TOPROL-XL) 25 MG 24 hr tablet Take 25 mg by mouth 2 (two) times daily.    . Multiple Vitamin (MULTIVITAMIN) tablet Take 1 tablet by mouth daily.      Marland Kitchen omeprazole (PRILOSEC) 20 MG capsule Take 1 capsule (20 mg total) by mouth daily. 30 capsule 3  . PARoxetine (PAXIL) 20 MG tablet Take 20 mg by mouth daily.     Vladimir Faster Glycol-Propyl Glycol (SYSTANE ULTRA) 0.4-0.3 % SOLN Place 1 drop into both eyes 3 (three) times daily.    . potassium chloride SA (K-DUR,KLOR-CON) 20 MEQ tablet Take 1 tablet (20 mEq total) by mouth daily. 30 tablet 1  . sodium chloride (OCEAN) 0.65 % SOLN nasal spray Place 1 spray into both nostrils as needed for congestion.    . traMADol (ULTRAM) 50 MG tablet Take 50 mg by mouth every 4-6 hours PRN severe pain. 30 tablet 0  . warfarin (COUMADIN) 2.5 MG tablet Take 1 tablet (2.5 mg total) by mouth daily at 6 PM. Or as directed. 30 tablet 1   No current facility-administered medications for this visit.      Past Surgical History:  Procedure Laterality Date  . APPENDECTOMY    . BREAST SURGERY Right 2003ish   biopsy  . CARDIAC CATHETERIZATION N/A 04/12/2016   Procedure: Right/Left Heart Cath and Coronary Angiography;  Surgeon: Leonie Man, MD;  Location: [facility];  Service: Cardiovascular;  Laterality: N/A;  . CATARACT EXTRACTION Bilateral 2017  . CLIPPING OF ATRIAL APPENDAGE  07/05/2016   Procedure: CLIPPING OF ATRIAL APPENDAGE;  Surgeon: Rexene Alberts, MD;  Location: [facility];  Service: Open Heart Surgery;;  . COLONOSCOPY  11/09   Dr. Gala Romney- external hemorrhoidal tags o/w normal rectal mucosa, s/p surgical resection with a normal appearing anastomosis 12cm, pan colonic diverticulum  . COLONOSCOPY N/A 06/02/2012    NTI:RWERXV post low anterior resection. Pancolonic diverticulosis. Colonic polyp-tubular adenoma. Surveillance due 2019.   Marland Kitchen COLONOSCOPY N/A 10/13/2015   Procedure: COLONOSCOPY;  Surgeon: Daneil Dolin, MD;  [name];  Service: Endoscopy;  Laterality: N/A;  0930  . ESOPHAGOGASTRODUODENOSCOPY  07/2007   Dr. Daiva Nakayama cervical esophageal web, schatzki ring, large hiatal hernia  . INGUNAL HERNIA REPAIR Left   . LOW ANTERIOR BOWEL RESECTION     NO ADJ CHEMO  . MINIMALLY INVASIVE MAZE PROCEDURE N/A 07/05/2016   Procedure: MINIMALLY INVASIVE MAZE PROCEDURE;  Surgeon:  Rexene Alberts, MD;  Location: [facility];  Service: Open Heart Surgery;  Laterality: N/A;  . MITRAL VALVE REPAIR Right 07/05/2016   Procedure: MINIMALLY INVASIVE MITRAL VALVE REPAIR (MVR);  Surgeon: Rexene Alberts, MD;  Location: [facility];  Service: Open Heart Surgery;  Laterality: Right;  . MULTIPLE EXTRACTIONS WITH ALVEOLOPLASTY N/A 05/21/2016   Procedure: MULTIPLE EXTRACTION OF TOOTH #'S 5, 21 WITH ALVEOLOPLASTY AND GROSS DEBRIDEMENT OF TEETH;  Surgeon: Lenn Cal, DDS;  Location: [facility];  Service: Oral Surgery;  Laterality: N/A;  . TEE WITHOUT CARDIOVERSION N/A 02/20/2016   Procedure: TRANSESOPHAGEAL ECHOCARDIOGRAM (TEE);  Surgeon: Arnoldo Lenis, MD;  [name];  Service: Endoscopy;  Laterality: N/A;  . TEE WITHOUT CARDIOVERSION N/A 07/05/2016   Procedure: TRANSESOPHAGEAL ECHOCARDIOGRAM (TEE);  Surgeon: Rexene Alberts, MD;  Location: [facility];  Service: Open Heart Surgery;  Laterality: N/A;  . TOOTH EXTRACTION    . TUBAL LIGATION    . VEIN LIGATION AND STRIPPING       Allergies  Allergen Reactions  . Iohexol Hives and Shortness Of Breath     Pt. had a severe allergic reaction to IV contrast the last time she was injected and had to be seen in the ER.   Marland Kitchen Hydrocodone-Acetaminophen Hives  . Nabumetone Rash  . Prednisone Rash      Family History  Problem Relation Age of Onset  . Breast  cancer Mother   . Rheum arthritis Father   . Cancer - Lung Sister   . Brain cancer Sister   . Prostate cancer Brother   . Aneurysm Brother   . Colon cancer Neg Hx      Social History Ms. Mcfann reports that she has never smoked. She has never used smokeless tobacco. Ms. Spohr reports that she does not drink alcohol.   Review of Systems CONSTITUTIONAL: No weight loss, fever, chills, weakness or fatigue.  HEENT: Eyes: No visual loss, blurred vision, double vision or yellow sclerae.No hearing loss, sneezing, congestion, runny nose or sore throat.  SKIN: No rash or itching.  CARDIOVASCULAR: per hpi RESPIRATORY: per hpi GASTROINTESTINAL: No anorexia, nausea, vomiting or diarrhea. No abdominal pain or blood.  GENITOURINARY: No burning on urination, no polyuria NEUROLOGICAL: No headache, dizziness, syncope, paralysis, ataxia, numbness or tingling in the extremities. No change in bowel or bladder control.  MUSCULOSKELETAL: No muscle, back pain, joint pain or stiffness.  LYMPHATICS: No enlarged nodes. No history of splenectomy.  PSYCHIATRIC: No history of depression or anxiety.  ENDOCRINOLOGIC: No reports of sweating, cold or heat intolerance. No polyuria or polydipsia.  Marland Kitchen   Physical Examination Vitals:   08/27/16 1322  BP: 100/61  Pulse: 74   Vitals:   08/27/16 1322  Weight: 150 lb 9.6 oz (68.3 kg)  Height: 5\' 2"  (1.575 m)    Gen: resting comfortably, no acute distress HEENT: no scleral icterus, pupils equal round and reactive, no palptable cervical adenopathy,  CV: RRR, n om/r/g, no jvd Resp: Clear to auscultation bilaterally GI: abdomen is soft, non-tender, non-distended, normal bowel sounds, no hepatosplenomegaly MSK: extremities are warm, no edema.  Skin: warm, no rash Neuro:  no focal deficits Psych: appropriate affect   Diagnostic Studies 01/2016 echo Study Conclusions  - Left ventricle: The cavity size was normal. Wall thickness was normal. Systolic  function was normal. The estimated ejection fraction was in the range of 60% to 65%. Wall motion was normal; there were no regional wall motion abnormalities. The study is not  technically sufficient to allow evaluation of LV diastolic function. Doppler parameters are consistent with high ventricular filling pressure. - Mitral valve: Calcified annulus. There was moderate to severe (closer to severe) eccentric regurgitation. - Left atrium: The atrium was severely dilated. - Right atrium: The atrium was moderately dilated. - Tricuspid valve: There was moderate-severe regurgitation. - Pulmonary arteries: PA peak pressure: 40 mm Hg (S).   01/2016 TEE eccentric moderate to severe MR. Moderate to severe TR. F/u full report for final findings.  08/22/16 echo  Study Conclusions  - Left ventricle: The cavity size was normal. Wall thickness was   normal. Systolic function was normal. The estimated ejection   fraction was in the range of 60% to 65%. Wall motion was normal;   there were no regional wall motion abnormalities. The study is   not technically sufficient to allow evaluation of LV diastolic   function. - Aortic valve: Mildly calcified annulus. Trileaflet. Valve area   (VTI): 1.6 cm^2. Valve area (Vmax): 1.59 cm^2. Valve area   (Vmean): 1.61 cm^2. - Mitral valve: Status post complex valvuloplasty including   artificial Gore-tex neochord placement and Sorin Memo 3D Ring   Annuloplasty (size 24mm, catalog #[id number], serial [id number]). Mildly   thickened leaflets. There was trivial regurgitation. Mean   gradient (D): 4 mm Hg. - Left atrium: The atrium was mildly to moderately dilated. - Right ventricle: The cavity size was mildly dilated. - Right atrium: The atrium was at the upper limits of normal in   size. Central venous pressure (est): 3 mm Hg. - Atrial septum: No defect or patent foramen ovale was identified. - Tricuspid valve: There was moderate regurgitation. -  Pulmonary arteries: Systolic pressure was moderately increased.   PA peak pressure: 50 mm Hg (S). - Pericardium, extracardiac: There was no pericardial effusion.  Impressions:  - Normal LV wall thickness with LVEF 60-65%. Indeterminate   diastolic function. Mild to moderate left atrial enlargement.   Status post mitral valve repair and annuloplasty as indicated   with mildly thickened leaflets and trivial mitral regurgitation.   Mean gradient 4 mmHg. Mild right ventricular enlargement. At   least moderate tricuspid regurgitation noted. Moderate pulmonary   hypertension with PASP 50 mmHg.  Assessment and Plan  1. Mitral regurgitation/Mitral valve repair - s/p mitral valve repair - doing well continue current meds  2. PAF - s/p MAZE procedure - currently on amio, Toprol - no recent symptoms, EKG today shows SR - continue amio, likely d/c at next f/u   3. COPD - recent increase in symptoms, will start spiriva  F/u 6 weeks      Arnoldo Lenis, M.D., F.A.C.C.

## 2016-08-27 NOTE — Patient Instructions (Signed)
Your physician recommends that you schedule a follow-up appointment in: [place] has recommended you make the following change in your medication:   START SPIRIVA 18 MCG DAILY  Thank you for choosing [facility]!!

## 2016-08-27 NOTE — Telephone Encounter (Signed)
-----   Message from Arnoldo Lenis, MD sent at 08/27/2016 12:33 PM EDT ----- Echo looks good  [name] MD

## 2016-08-27 NOTE — Telephone Encounter (Signed)
Pt made aware at today's office visit - routed to pcp

## 2016-08-28 DIAGNOSIS — N390 Urinary tract infection, site not specified: Secondary | ICD-10-CM | POA: Diagnosis not present

## 2016-08-28 DIAGNOSIS — J449 Chronic obstructive pulmonary disease, unspecified: Secondary | ICD-10-CM | POA: Diagnosis not present

## 2016-08-28 DIAGNOSIS — E785 Hyperlipidemia, unspecified: Secondary | ICD-10-CM | POA: Diagnosis not present

## 2016-08-28 DIAGNOSIS — I482 Chronic atrial fibrillation: Secondary | ICD-10-CM | POA: Diagnosis not present

## 2016-08-28 DIAGNOSIS — I5033 Acute on chronic diastolic (congestive) heart failure: Secondary | ICD-10-CM | POA: Diagnosis not present

## 2016-08-28 DIAGNOSIS — Z48812 Encounter for surgical aftercare following surgery on the circulatory system: Secondary | ICD-10-CM | POA: Diagnosis not present

## 2016-08-29 ENCOUNTER — Telehealth: Payer: Self-pay | Admitting: *Deleted

## 2016-08-29 ENCOUNTER — Ambulatory Visit (INDEPENDENT_AMBULATORY_CARE_PROVIDER_SITE_OTHER): Payer: Medicare Other | Admitting: *Deleted

## 2016-08-29 DIAGNOSIS — M159 Polyosteoarthritis, unspecified: Secondary | ICD-10-CM | POA: Diagnosis not present

## 2016-08-29 DIAGNOSIS — N390 Urinary tract infection, site not specified: Secondary | ICD-10-CM | POA: Diagnosis not present

## 2016-08-29 DIAGNOSIS — I4891 Unspecified atrial fibrillation: Secondary | ICD-10-CM | POA: Diagnosis not present

## 2016-08-29 DIAGNOSIS — I481 Persistent atrial fibrillation: Secondary | ICD-10-CM

## 2016-08-29 DIAGNOSIS — Z48812 Encounter for surgical aftercare following surgery on the circulatory system: Secondary | ICD-10-CM | POA: Diagnosis not present

## 2016-08-29 DIAGNOSIS — I482 Chronic atrial fibrillation: Secondary | ICD-10-CM | POA: Diagnosis not present

## 2016-08-29 DIAGNOSIS — I5033 Acute on chronic diastolic (congestive) heart failure: Secondary | ICD-10-CM | POA: Diagnosis not present

## 2016-08-29 DIAGNOSIS — Z5181 Encounter for therapeutic drug level monitoring: Secondary | ICD-10-CM

## 2016-08-29 DIAGNOSIS — I4819 Other persistent atrial fibrillation: Secondary | ICD-10-CM

## 2016-08-29 DIAGNOSIS — E785 Hyperlipidemia, unspecified: Secondary | ICD-10-CM | POA: Diagnosis not present

## 2016-08-29 DIAGNOSIS — J449 Chronic obstructive pulmonary disease, unspecified: Secondary | ICD-10-CM | POA: Diagnosis not present

## 2016-08-29 DIAGNOSIS — Z9889 Other specified postprocedural states: Secondary | ICD-10-CM

## 2016-08-29 DIAGNOSIS — E7800 Pure hypercholesterolemia, unspecified: Secondary | ICD-10-CM | POA: Diagnosis not present

## 2016-08-29 LAB — POCT INR: INR: 3.1

## 2016-08-30 NOTE — Telephone Encounter (Signed)
See anticoagulation encounter for details.

## 2016-09-04 ENCOUNTER — Telehealth: Payer: Self-pay | Admitting: Cardiology

## 2016-09-04 ENCOUNTER — Other Ambulatory Visit: Payer: Self-pay | Admitting: Physician Assistant

## 2016-09-04 DIAGNOSIS — E785 Hyperlipidemia, unspecified: Secondary | ICD-10-CM | POA: Diagnosis not present

## 2016-09-04 DIAGNOSIS — J449 Chronic obstructive pulmonary disease, unspecified: Secondary | ICD-10-CM | POA: Diagnosis not present

## 2016-09-04 DIAGNOSIS — I5033 Acute on chronic diastolic (congestive) heart failure: Secondary | ICD-10-CM | POA: Diagnosis not present

## 2016-09-04 DIAGNOSIS — N390 Urinary tract infection, site not specified: Secondary | ICD-10-CM | POA: Diagnosis not present

## 2016-09-04 DIAGNOSIS — Z48812 Encounter for surgical aftercare following surgery on the circulatory system: Secondary | ICD-10-CM | POA: Diagnosis not present

## 2016-09-04 DIAGNOSIS — I482 Chronic atrial fibrillation: Secondary | ICD-10-CM | POA: Diagnosis not present

## 2016-09-04 NOTE — Telephone Encounter (Signed)
Lynda Rainwater (home health nurse) that per last office visit 08/27/16 patient is to be taking Spiriva 18 mcg daily. Home health nurse verbalized understanding and stated she would have patient pick up inhaler today.

## 2016-09-04 NOTE — Telephone Encounter (Signed)
[name] [name] with [facility] called stating that [name] [name] has 2 different types of inhalers. They need to know exactly what Dr. [name] wants her to use.  Please call [phone].

## 2016-09-07 ENCOUNTER — Other Ambulatory Visit: Payer: Self-pay | Admitting: Physician Assistant

## 2016-09-07 ENCOUNTER — Ambulatory Visit: Payer: Self-pay | Admitting: Cardiology

## 2016-09-07 DIAGNOSIS — I5033 Acute on chronic diastolic (congestive) heart failure: Secondary | ICD-10-CM | POA: Diagnosis not present

## 2016-09-07 DIAGNOSIS — E785 Hyperlipidemia, unspecified: Secondary | ICD-10-CM | POA: Diagnosis not present

## 2016-09-07 DIAGNOSIS — I482 Chronic atrial fibrillation: Secondary | ICD-10-CM | POA: Diagnosis not present

## 2016-09-07 DIAGNOSIS — J449 Chronic obstructive pulmonary disease, unspecified: Secondary | ICD-10-CM | POA: Diagnosis not present

## 2016-09-07 DIAGNOSIS — N390 Urinary tract infection, site not specified: Secondary | ICD-10-CM | POA: Diagnosis not present

## 2016-09-07 DIAGNOSIS — Z48812 Encounter for surgical aftercare following surgery on the circulatory system: Secondary | ICD-10-CM | POA: Diagnosis not present

## 2016-09-10 ENCOUNTER — Other Ambulatory Visit: Payer: Self-pay | Admitting: Physician Assistant

## 2016-09-10 ENCOUNTER — Telehealth: Payer: Self-pay | Admitting: *Deleted

## 2016-09-10 DIAGNOSIS — Z48812 Encounter for surgical aftercare following surgery on the circulatory system: Secondary | ICD-10-CM | POA: Diagnosis not present

## 2016-09-10 DIAGNOSIS — I5033 Acute on chronic diastolic (congestive) heart failure: Secondary | ICD-10-CM | POA: Diagnosis not present

## 2016-09-10 DIAGNOSIS — N390 Urinary tract infection, site not specified: Secondary | ICD-10-CM | POA: Diagnosis not present

## 2016-09-10 DIAGNOSIS — J449 Chronic obstructive pulmonary disease, unspecified: Secondary | ICD-10-CM | POA: Diagnosis not present

## 2016-09-10 DIAGNOSIS — E785 Hyperlipidemia, unspecified: Secondary | ICD-10-CM | POA: Diagnosis not present

## 2016-09-10 DIAGNOSIS — I482 Chronic atrial fibrillation: Secondary | ICD-10-CM | POA: Diagnosis not present

## 2016-09-10 MED ORDER — UMECLIDINIUM-VILANTEROL 62.5-25 MCG/INH IN AEPB: 1.0000 | INHALATION_SPRAY | Freq: Every day | RESPIRATORY_TRACT | 6 refills | Status: DC

## 2016-09-10 NOTE — Telephone Encounter (Signed)
Ok to use anoro ellipta instead. The dose of this combination medicine is 62.5 mcg/ 25 mcg: One inhalation once daily  [name] MD

## 2016-09-10 NOTE — Telephone Encounter (Signed)
Patient recently prescribed Spiriva.  Insurance does not want to pay for this & wants her to use [place] Ellipta 62.5 mcg inh.  Please advise.

## 2016-09-10 NOTE — Telephone Encounter (Signed)
Noted. Will send new rx to pharmacy.   

## 2016-09-11 ENCOUNTER — Ambulatory Visit (INDEPENDENT_AMBULATORY_CARE_PROVIDER_SITE_OTHER): Payer: Medicare Other | Admitting: *Deleted

## 2016-09-11 ENCOUNTER — Other Ambulatory Visit: Payer: Self-pay | Admitting: Cardiology

## 2016-09-11 DIAGNOSIS — Z48812 Encounter for surgical aftercare following surgery on the circulatory system: Secondary | ICD-10-CM | POA: Diagnosis not present

## 2016-09-11 DIAGNOSIS — N390 Urinary tract infection, site not specified: Secondary | ICD-10-CM | POA: Diagnosis not present

## 2016-09-11 DIAGNOSIS — J449 Chronic obstructive pulmonary disease, unspecified: Secondary | ICD-10-CM | POA: Diagnosis not present

## 2016-09-11 DIAGNOSIS — I482 Chronic atrial fibrillation: Secondary | ICD-10-CM | POA: Diagnosis not present

## 2016-09-11 DIAGNOSIS — I481 Persistent atrial fibrillation: Secondary | ICD-10-CM

## 2016-09-11 DIAGNOSIS — Z9889 Other specified postprocedural states: Secondary | ICD-10-CM

## 2016-09-11 DIAGNOSIS — I5033 Acute on chronic diastolic (congestive) heart failure: Secondary | ICD-10-CM | POA: Diagnosis not present

## 2016-09-11 DIAGNOSIS — Z5181 Encounter for therapeutic drug level monitoring: Secondary | ICD-10-CM

## 2016-09-11 DIAGNOSIS — I4819 Other persistent atrial fibrillation: Secondary | ICD-10-CM

## 2016-09-11 DIAGNOSIS — E785 Hyperlipidemia, unspecified: Secondary | ICD-10-CM | POA: Diagnosis not present

## 2016-09-11 LAB — POCT INR: INR: 2

## 2016-09-18 ENCOUNTER — Encounter (HOSPITAL_COMMUNITY)
Admission: RE | Admit: 2016-09-18 | Discharge: 2016-09-18 | Disposition: A | Discharge status: Home / Self Care | Payer: Medicare Other | Source: Ambulatory Visit | Attending: Cardiology | Admitting: Cardiology

## 2016-09-18 VITALS — BP 122/64 | HR 74 | Ht 62.0 in | Wt 148.3 lb

## 2016-09-18 DIAGNOSIS — Z7901 Long term (current) use of anticoagulants: Secondary | ICD-10-CM | POA: Insufficient documentation

## 2016-09-18 DIAGNOSIS — Z9889 Other specified postprocedural states: Secondary | ICD-10-CM | POA: Diagnosis not present

## 2016-09-18 DIAGNOSIS — K219 Gastro-esophageal reflux disease without esophagitis: Secondary | ICD-10-CM | POA: Diagnosis not present

## 2016-09-18 DIAGNOSIS — Z86718 Personal history of other venous thrombosis and embolism: Secondary | ICD-10-CM | POA: Diagnosis not present

## 2016-09-18 DIAGNOSIS — I5032 Chronic diastolic (congestive) heart failure: Secondary | ICD-10-CM | POA: Insufficient documentation

## 2016-09-18 NOTE — Progress Notes (Signed)
Cardiac Individual Treatment Plan  Patient Details  Name: [name] [name] MRN: [id number] Date of Birth: 1940-06-04 Referring Provider:     [facility] from 09/18/2016 in [place]  Referring Provider  Dr. [name]      Initial Encounter Date:    CARDIAC REHAB PHASE II ORIENTATION from 09/18/2016 in [place]  Date  09/18/16  Referring Provider  Dr. [name]      Visit Diagnosis: S/P mitral valve repair  Patient's Home Medications on Admission:  Current Outpatient Prescriptions:  .  acetaminophen (TYLENOL) 325 MG tablet, Take 650 mg by mouth every 6 (six) hours as needed for pain., Disp: , Rfl:  .  albuterol (PROVENTIL HFA;VENTOLIN HFA) 108 (90 BASE) MCG/ACT inhaler, Inhale 2 puffs into the lungs every 6 (six) hours as needed for wheezing., Disp: , Rfl:  .  ALPRAZolam (XANAX) 0.5 MG tablet, Take 0.5 mg by mouth 2 (two) times daily as needed (for anxiety/sleep (scheduled at bedtime)). , Disp: , Rfl:  .  amiodarone (PACERONE) 200 MG tablet, Take 1 tablet (200 mg total) by mouth daily. TAKE 200 MG DAILY, Disp: 180 tablet, Rfl: 1 .  aspirin EC 81 MG EC tablet, Take 1 tablet (81 mg total) by mouth daily., Disp: , Rfl:  .  calcium carbonate (OS-CAL) 600 MG TABS tablet, Take 600 mg by mouth 2 (two) times daily with a meal., Disp: , Rfl:  .  Coenzyme Q10 100 MG TABS, Take 100 mg by mouth every evening. , Disp: , Rfl:  .  docusate sodium (COLACE) 100 MG capsule, Take 200 mg by mouth daily as needed for mild constipation. , Disp: , Rfl:  .  furosemide (LASIX) 40 MG tablet, Take 1 tablet (40 mg total) by mouth daily., Disp: 30 tablet, Rfl: 1 .  hydrocortisone (ANUSOL-HC) 2.5 % rectal cream, Place 1 application rectally 2 (two) times daily. FOR UP TO 10 DAYS AT A TIME (Patient taking differently: Place 1 application rectally 2 (two) times daily as needed for hemorrhoids or itching. FOR UP TO 10 DAYS AT A TIME), Disp: 30 g, Rfl:  1 .  metoprolol succinate (TOPROL-XL) 25 MG 24 hr tablet, Take 25 mg by mouth 2 (two) times daily., Disp: , Rfl:  .  Multiple Vitamin (MULTIVITAMIN) tablet, Take 1 tablet by mouth daily.  , Disp: , Rfl:  .  omeprazole (PRILOSEC) 20 MG capsule, Take 1 capsule (20 mg total) by mouth daily., Disp: 30 capsule, Rfl: 3 .  PARoxetine (PAXIL) 20 MG tablet, Take 20 mg by mouth daily. , Disp: , Rfl:  .  Polyethyl Glycol-Propyl Glycol (SYSTANE ULTRA) 0.4-0.3 % SOLN, Place 1 drop into both eyes 3 (three) times daily., Disp: , Rfl:  .  potassium chloride SA (K-DUR,KLOR-CON) 20 MEQ tablet, Take 1 tablet (20 mEq total) by mouth daily., Disp: 30 tablet, Rfl: 1 .  sodium chloride (OCEAN) 0.65 % SOLN nasal spray, Place 1 spray into both nostrils as needed for congestion., Disp: , Rfl:  .  traMADol (ULTRAM) 50 MG tablet, Take 50 mg by mouth every 4-6 hours PRN severe pain., Disp: 30 tablet, Rfl: 0 .  umeclidinium-vilanterol (ANORO ELLIPTA) 62.5-25 MCG/INH AEPB, Inhale 1 puff into the lungs daily., Disp: 30 each, Rfl: 6 .  warfarin (COUMADIN) 2.5 MG tablet, TAKE 1 TABLET BY MOUTH DAILY AT 6PM, Disp: 36 tablet, Rfl: 3  Past Medical History: Past Medical History:  Diagnosis Date  . Anxiety   . Arthritis   .  Asthma   . Atrial fibrillation, persistent ([place])   . Chronic diastolic congestive heart failure ([place])   . Colon adenoma   . Colon cancer ([place])    status post low anterior resection, limited stage disease requiring no adjuvant therapy  . COPD (chronic obstructive pulmonary disease) ([place])   . Depression   . Diverticulosis   . DM (dermatomyositis)   . DVT (deep venous thrombosis) ([name])    in leg- long time ago  . Dyspnea    with activity  . Dysrhythmia   . Esophageal dysphagia   . GERD (gastroesophageal reflux disease)   . Headache   . Heart murmur   . Hematuria   . Hemorrhoids   . Hiatal hernia   . History of kidney stones    x 2  . Hypercholesterolemia   . Incidental pulmonary nodule 05/08/2016    8 mm vague opacity RML noted on CT scan  . Mitral regurgitation   . PONV (postoperative nausea and vomiting) 2003 ish    with breast biopsy  . S/P minimally invasive maze operation for atrial fibrillation 07/05/2016   Complete bilateral atrial lesion set using cryothermy and bipolar radiofrequency ablation with clipping of LA appendage via right mini thoracotomy approach  . 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 Schatzki's ring   . Tricuspid regurgitation     Tobacco Use: History  Smoking Status  . Never Smoker  Smokeless Tobacco  . Never Used    Labs: Recent Review Flowsheet Data    Labs for ITP Cardiac and Pulmonary Rehab Latest Ref Rng & Units 07/05/2016 07/06/2016 07/06/2016 07/06/2016 07/06/2016   Hemoglobin A1c 4.8 - 5.6 % - - - - -   PHART 7.350 - 7.450 7.278(L) 7.288(L) 7.246(L) 7.330(L) -   PCO2ART 32.0 - 48.0 mmHg 54.1(H) 55.1(H) 63.4(H) 46.7 -   HCO3 20.0 - 28.0 mmol/L 25.4 26.4 26.1 24.9 -   TCO2 0 - 100 mmol/L 27 28 28 26 27    ACIDBASEDEF 0.0 - 2.0 mmol/L 2.0 1.0 - 1.0 -   O2SAT % 96.0 98.0 98.0 97.0 -      Capillary Blood Glucose: Lab Results  Component Value Date   GLUCAP 119 (H) 07/09/2016   GLUCAP 110 (H) 07/08/2016   GLUCAP 98 07/08/2016   GLUCAP 97 07/08/2016   GLUCAP 109 (H) 07/08/2016     Exercise Target Goals: Date: 09/18/16  Exercise Program Goal: Individual exercise prescription set with THRR, safety & activity barriers. Participant demonstrates ability to understand and report RPE using BORG scale, to self-measure pulse accurately, and to acknowledge the importance of the exercise prescription.  Exercise Prescription Goal: Starting with aerobic activity 30 plus minutes a day, 3 days per week for initial exercise prescription. Provide home exercise prescription and guidelines  that participant acknowledges understanding prior to discharge.  Activity Barriers & Risk Stratification:   6 Minute Walk:     6 Minute Walk    Row Name 09/18/16 1432         6 Minute Walk   Phase Initial     Distance 1200 feet     Distance % Change 0 %     Walk Time 6 minutes     # of Rest Breaks 0     MPH 2.27     METS 2.74     RPE 11  Perceived Dyspnea  13     VO2 Peak 9.04     Symptoms No     Resting HR 74 bpm     Resting BP 122/64     Max Ex. HR 114 bpm     Max Ex. BP 146/70     2 Minute Post BP 130/60        Oxygen Initial Assessment:   Oxygen Re-Evaluation:   Oxygen Discharge (Final Oxygen Re-Evaluation):   Initial Exercise Prescription:     Initial Exercise Prescription - 09/18/16 1400      Date of Initial Exercise RX and Referring Provider   Date 09/18/16   Referring Provider Dr. [name]     Treadmill   MPH 1.3   Grade 0   Minutes 15   METs 1.9     NuStep   Level 2   SPM 10   Minutes 20   METs 1.7     Prescription Details   Frequency (times per week) 3   Duration Progress to 30 minutes of continuous aerobic without signs/symptoms of physical distress     Intensity   THRR 40-80% of Max Heartrate 102-116-130   Ratings of Perceived Exertion 11-13   Perceived Dyspnea 0-4     Progression   Progression Continue progressive overload as per policy without signs/symptoms or physical distress.     Resistance Training   Training Prescription Yes   Weight 1   Reps 10-15      Perform Capillary Blood Glucose checks as needed.  Exercise Prescription Changes:   Exercise Comments:   Exercise Goals and Review:      Exercise Goals    Row Name 09/18/16 1454             Exercise Goals   Increase Physical Activity Yes       Intervention Provide advice, education, support and counseling about physical activity/exercise needs.;Develop an individualized exercise prescription for aerobic and resistive training based on initial  evaluation findings, risk stratification, comorbidities and participant's personal goals.       Expected Outcomes Achievement of increased cardiorespiratory fitness and enhanced flexibility, muscular endurance and strength shown through measurements of functional capacity and personal statement of participant.       Increase Strength and Stamina Yes       Intervention Provide advice, education, support and counseling about physical activity/exercise needs.;Develop an individualized exercise prescription for aerobic and resistive training based on initial evaluation findings, risk stratification, comorbidities and participant's personal goals.       Expected Outcomes Achievement of increased cardiorespiratory fitness and enhanced flexibility, muscular endurance and strength shown through measurements of functional capacity and personal statement of participant.          Exercise Goals Re-Evaluation :    Discharge Exercise Prescription (Final Exercise Prescription Changes):   Nutrition:  Target Goals: Understanding of nutrition guidelines, daily intake of sodium 1500mg , cholesterol 200mg , calories 30% from fat and 7% or less from saturated fats, daily to have 5 or more servings of fruits and vegetables.  Biometrics:     Pre Biometrics - 09/18/16 1434      Pre Biometrics   Height 5\' 2"  (1.575 m)   Weight 148 lb 2.4 oz (67.2 kg)   Waist Circumference 36 inches   Hip Circumference 39 inches   Waist to Hip Ratio 0.92 %   BMI (Calculated) 27.2   Triceps Skinfold 15 mm   % Body Fat 36.7 %   Grip Strength 48.4 kg  Flexibility 14.75 in   Single Leg Stand 12.5 seconds       Nutrition Therapy Plan and Nutrition Goals:   Nutrition Discharge: Rate Your Plate Scores:     Nutrition Assessments - 09/18/16 1455      MEDFICTS Scores   Pre Score 45      Nutrition Goals Re-Evaluation:   Nutrition Goals Discharge (Final Nutrition Goals Re-Evaluation):   Psychosocial: Target  Goals: Acknowledge presence or absence of significant depression and/or stress, maximize coping skills, provide positive support system. Participant is able to verbalize types and ability to use techniques and skills needed for reducing stress and depression.  Initial Review & Psychosocial Screening:     Initial Psych Review & Screening - 09/18/16 1457      Initial Review   Current issues with Current Depression     Family Dynamics   Good Support System? Yes     Barriers   Psychosocial barriers to participate in program The patient should benefit from training in stress management and relaxation.;Psychosocial barriers identified (see note)     Screening Interventions   Interventions Encouraged to exercise;Program counselor consult      Quality of Life Scores:     Quality of Life - 09/18/16 1435      Quality of Life Scores   Health/Function Pre 16.7 %   Socioeconomic Pre 10.43 %   Psych/Spiritual Pre 8.93 %   Family Pre 10.1 %   GLOBAL Pre 12.84 %      PHQ-9: Recent Review Flowsheet Data    Depression screen [facility] 2/9 09/18/2016   Decreased Interest 1   Down, Depressed, Hopeless 1   PHQ - 2 Score 2   Altered sleeping 1   Tired, decreased energy 1   Change in appetite 1   Feeling bad or failure about yourself  1   Trouble concentrating 1   Moving slowly or fidgety/restless 1   Suicidal thoughts 1   PHQ-9 Score 9   Difficult doing work/chores Very difficult     Interpretation of Total Score  Total Score Depression Severity:  1-4 = Minimal depression, 5-9 = Mild depression, 10-14 = Moderate depression, 15-19 = Moderately severe depression, 20-27 = Severe depression   Psychosocial Evaluation and Intervention:     Psychosocial Evaluation - 09/18/16 1458      Psychosocial Evaluation & Interventions   Interventions Relaxation education;Encouraged to exercise with the program and follow exercise prescription;Therapist referral   Comments She will consult her MD  about a therapist referral   Continue Psychosocial Services  Follow up required by staff      Psychosocial Re-Evaluation:   Psychosocial Discharge (Final Psychosocial Re-Evaluation):   Vocational Rehabilitation: Provide vocational rehab assistance to qualifying candidates.   Vocational Rehab Evaluation & Intervention:     Vocational Rehab - 09/18/16 1453      Initial Vocational Rehab Evaluation & Intervention   Assessment shows need for Vocational Rehabilitation No      Education: Education Goals: Education classes will be provided on a weekly basis, covering required topics. Participant will state understanding/return demonstration of topics presented.  Learning Barriers/Preferences:     Learning Barriers/Preferences - 09/18/16 1452      Learning Barriers/Preferences   Learning Barriers None   Learning Preferences Group Instruction;Skilled Demonstration;Verbal Instruction;Video;Pictoral      Education Topics: Hypertension, Hypertension Reduction -Define heart disease and high blood pressure. Discus how high blood pressure affects the body and ways to reduce high blood pressure.   Exercise and  Your Heart -Discuss why it is important to exercise, the FITT principles of exercise, normal and abnormal responses to exercise, and how to exercise safely.   Angina -Discuss definition of angina, causes of angina, treatment of angina, and how to decrease risk of having angina.   Cardiac Medications -Review what the following cardiac medications are used for, how they affect the body, and side effects that may occur when taking the medications.  Medications include Aspirin, Beta blockers, calcium channel blockers, ACE Inhibitors, angiotensin receptor blockers, diuretics, digoxin, and antihyperlipidemics.   Congestive Heart Failure -Discuss the definition of CHF, how to live with CHF, the signs and symptoms of CHF, and how keep track of weight and sodium intake.   Heart  Disease and Intimacy -Discus the effect sexual activity has on the heart, how changes occur during intimacy as we age, and safety during sexual activity.   Smoking Cessation / COPD -Discuss different methods to quit smoking, the health benefits of quitting smoking, and the definition of COPD.   Nutrition I: Fats -Discuss the types of cholesterol, what cholesterol does to the heart, and how cholesterol levels can be controlled.   Nutrition II: Labels -Discuss the different components of food labels and how to read food label   Heart Parts and Heart Disease -Discuss the anatomy of the heart, the pathway of blood circulation through the heart, and these are affected by heart disease.   Stress I: Signs and Symptoms -Discuss the causes of stress, how stress may lead to anxiety and depression, and ways to limit stress.   Stress II: Relaxation -Discuss different types of relaxation techniques to limit stress.   Warning Signs of Stroke / TIA -Discuss definition of a stroke, what the signs and symptoms are of a stroke, and how to identify when someone is having stroke.   Knowledge Questionnaire Score:     Knowledge Questionnaire Score - 09/18/16 1453      Knowledge Questionnaire Score   Pre Score 20/24      Core Components/Risk Factors/Patient Goals at Admission:     Personal Goals and Risk Factors at Admission - 09/18/16 1455      Core Components/Risk Factors/Patient Goals on Admission    Weight Management Weight Maintenance   Improve shortness of breath with ADL's Yes   Intervention Provide education, individualized exercise plan and daily activity instruction to help decrease symptoms of SOB with activities of daily living.   Expected Outcomes Short Term: Achieves a reduction of symptoms when performing activities of daily living.   Personal Goal Other Yes   Personal Goal Keep exercising, Be able to get up and do things more regularily.   Intervention Attend CR 3xweek  and supplement at home 2xweek.   Expected Outcomes Achieve personal goals      Core Components/Risk Factors/Patient Goals Review:      Goals and Risk Factor Review    Row Name 09/18/16 1457             Core Components/Risk Factors/Patient Goals Review   Personal Goals Review Weight Management/Obesity;Improve shortness of breath with ADL's;Stress          Core Components/Risk Factors/Patient Goals at Discharge (Final Review):      Goals and Risk Factor Review - 09/18/16 1457      Core Components/Risk Factors/Patient Goals Review   Personal Goals Review Weight Management/Obesity;Improve shortness of breath with ADL's;Stress      ITP Comments:   Comments: Patient arrived for 1st visit/orientation/education at 13:30. Patient  was referred to CR by Dr. [name] due to Mitral Valve Repair [phone]). During orientation advised patient on arrival and appointment times what to wear, what to do before, during and after exercise. Reviewed attendance and class policy. Talked about inclement weather and class consultation policy. Pt is scheduled to return Cardiac Rehab on 09/24/16 at 0815. Pt was advised to come to class 15 minutes before class starts. Patient was also given instructions on meeting with the dietician and attending the Family Structure classes. Pt is eager to get started. Patient participated in warm up stretches followed by light weights and resistance bands. Patient was then able to complete 6 minute walk test. Patient was measured for the equipment. Discussed equipment safety with patient. Took patient pre-anthropometric measurements. Patient finished visit at 14:45.

## 2016-09-18 NOTE — Progress Notes (Signed)
Daily Session Note  Patient Details  Name: [name] [name] MRN: [id number] Date of Birth: 07-Nov-1940 Referring Provider:     CARDIAC REHAB PHASE II ORIENTATION from 09/18/2016 in [place]  Referring Provider  Dr. [name]      Encounter Date: 09/18/2016  Check In:     Session Check In - 09/18/16 1230      Check-In   Location [facility]   Staff Present Marnisha Stampley Angelina Pih, MS, EP, [facility], Exercise Physiologist;Gregory Luther Parody, BS, EP, Exercise Physiologist   Supervising physician immediately available to respond to emergencies See telemetry face sheet for immediately available MD   Medication changes reported     No   Fall or balance concerns reported    No   Tobacco Cessation --  Never smoked   Warm-up and Cool-down Performed as group-led instruction   Resistance Training Performed Yes   VAD Patient? No     Pain Assessment   Currently in Pain? No/denies   Pain Score 0-No pain   Multiple Pain Sites No      Capillary Blood Glucose: No results found for this or any previous visit (from the past 24 hour(s)).    History  Smoking Status  . Never Smoker  Smokeless Tobacco  . Never Used    Goals Met:  Independence with exercise equipment Achieving weight loss Personal goals reviewed No report of cardiac concerns or symptoms Strength training completed today  Goals Unmet:  Not Applicable  Comments: Check out 2:45pm   Dr. Kate Sable is Medical Director for [place] and Pulmonary Rehab.

## 2016-09-18 NOTE — Progress Notes (Signed)
Cardiac/Pulmonary Rehab Medication Review by a Pharmacist  Does the patient  feel that his/her medications are working for him/her?  yes  Has the patient been experiencing any side effects to the medications prescribed?  no  Does the patient measure his/her own blood pressure or blood glucose at home?  yes   Does the patient have any problems obtaining medications due to transportation or finances?   no  Understanding of regimen: good Understanding of indications: good Potential of compliance: excellent  Questions asked to Determine Patient Understanding of Medication Regimen:  1. What is the name of the medication?  2. What is the medication used for?  3. When should it be taken?  4. How much should be taken?  5. How will you take it?  6. What side effects should you report?  Understanding Defined as: Excellent: All questions above are correct Good: Questions 1-4 are correct Fair: Questions 1-2 are correct  Poor: 1 or none of the above questions are correct   Pharmacist comments: Pt does not c/o any side effects from medications.  Pt does use daily inhaler (Anoro) and rescue inhaler (albuterol).  Pt states she takes her BP at home daily most times.  Hart Robinsons A 09/18/2016 2:14 PM

## 2016-09-24 ENCOUNTER — Encounter (HOSPITAL_COMMUNITY)
Admission: RE | Admit: 2016-09-24 | Discharge: 2016-09-24 | Disposition: A | Discharge status: Home / Self Care | Payer: Medicare Other | Source: Ambulatory Visit | Attending: Cardiology | Admitting: Cardiology

## 2016-09-24 DIAGNOSIS — Z86718 Personal history of other venous thrombosis and embolism: Secondary | ICD-10-CM | POA: Diagnosis not present

## 2016-09-24 DIAGNOSIS — K219 Gastro-esophageal reflux disease without esophagitis: Secondary | ICD-10-CM | POA: Diagnosis not present

## 2016-09-24 DIAGNOSIS — Z9889 Other specified postprocedural states: Secondary | ICD-10-CM

## 2016-09-24 DIAGNOSIS — Z7901 Long term (current) use of anticoagulants: Secondary | ICD-10-CM | POA: Diagnosis not present

## 2016-09-24 DIAGNOSIS — I5032 Chronic diastolic (congestive) heart failure: Secondary | ICD-10-CM | POA: Diagnosis not present

## 2016-09-24 NOTE — Progress Notes (Signed)
Daily Session Note  Patient Details  Name: [name] [name] MRN: [id number] Date of Birth: [birth year] Referring Provider:     CARDIAC REHAB PHASE II ORIENTATION from 09/18/2016 in [place]  Referring Provider  Dr. [name]      Encounter Date: 09/24/2016  Check In:     Session Check In - 09/24/16 0817      Check-In   Location [facility]   Staff Present Aundra Dubin, RN, BSN;Jayvien Rowlette Luther Parody, BS, EP, Exercise Physiologist   Supervising physician immediately available to respond to emergencies See telemetry face sheet for immediately available MD   Medication changes reported     No   Fall or balance concerns reported    No   Warm-up and Cool-down Performed as group-led instruction   Resistance Training Performed Yes   VAD Patient? No     Pain Assessment   Currently in Pain? No/denies   Pain Score 0-No pain   Multiple Pain Sites No      Capillary Blood Glucose: No results found for this or any previous visit (from the past 24 hour(s)).    History  Smoking Status  . Never Smoker  Smokeless Tobacco  . Never Used    Goals Met:  Independence with exercise equipment Exercise tolerated well No report of cardiac concerns or symptoms Strength training completed today  Goals Unmet:  Not Applicable  Comments: Check out 915   Dr. Kate Sable is Medical Director for [place] and Pulmonary Rehab.

## 2016-09-25 ENCOUNTER — Ambulatory Visit (INDEPENDENT_AMBULATORY_CARE_PROVIDER_SITE_OTHER): Payer: Medicare Other | Admitting: *Deleted

## 2016-09-25 DIAGNOSIS — I481 Persistent atrial fibrillation: Secondary | ICD-10-CM

## 2016-09-25 DIAGNOSIS — Z5181 Encounter for therapeutic drug level monitoring: Secondary | ICD-10-CM | POA: Diagnosis not present

## 2016-09-25 DIAGNOSIS — I4819 Other persistent atrial fibrillation: Secondary | ICD-10-CM

## 2016-09-25 DIAGNOSIS — Z9889 Other specified postprocedural states: Secondary | ICD-10-CM | POA: Diagnosis not present

## 2016-09-25 LAB — POCT INR: INR: 1.7

## 2016-09-26 ENCOUNTER — Encounter (HOSPITAL_COMMUNITY)
Admission: RE | Admit: 2016-09-26 | Discharge: 2016-09-26 | Disposition: A | Discharge status: Home / Self Care | Payer: Medicare Other | Source: Ambulatory Visit | Attending: Cardiology | Admitting: Cardiology

## 2016-09-26 DIAGNOSIS — Z7901 Long term (current) use of anticoagulants: Secondary | ICD-10-CM | POA: Diagnosis not present

## 2016-09-26 DIAGNOSIS — I5032 Chronic diastolic (congestive) heart failure: Secondary | ICD-10-CM | POA: Diagnosis not present

## 2016-09-26 DIAGNOSIS — K219 Gastro-esophageal reflux disease without esophagitis: Secondary | ICD-10-CM | POA: Diagnosis not present

## 2016-09-26 DIAGNOSIS — Z86718 Personal history of other venous thrombosis and embolism: Secondary | ICD-10-CM | POA: Diagnosis not present

## 2016-09-26 DIAGNOSIS — Z9889 Other specified postprocedural states: Secondary | ICD-10-CM | POA: Diagnosis not present

## 2016-09-26 NOTE — Progress Notes (Signed)
Daily Session Note  Patient Details  Name: [name] [name] MRN: [id number] Date of Birth: 03/20/1941 Referring Provider:     CARDIAC REHAB PHASE II ORIENTATION from 09/18/2016 in [place] CARDIAC REHABILITATION  Referring Provider  Dr. Branch      Encounter Date: 09/26/2016  Check In:     Session Check In - 09/26/16 0815      Check-In   Location [facility]   Staff Present  , MS, EP, [facility], Exercise Physiologist;Debra Johnson, RN, BSN;Gregory Cowan, BS, EP, Exercise Physiologist   Supervising physician immediately available to respond to emergencies See telemetry face sheet for immediately available MD   Medication changes reported     No   Fall or balance concerns reported    No   Tobacco Cessation No Change   Warm-up and Cool-down Performed as group-led instruction   Resistance Training Performed Yes   VAD Patient? No     Pain Assessment   Currently in Pain? No/denies   Pain Score 0-No pain   Multiple Pain Sites No      Capillary Blood Glucose: Results for orders placed or performed in visit on 09/25/16 (from the past 24 hour(s))  POCT INR     Status: Abnormal   Collection Time: 09/25/16  1:12 PM  Result Value Ref Range   INR 1.7       History  Smoking Status  . Never Smoker  Smokeless Tobacco  . Never Used    Goals Met:  Independence with exercise equipment Exercise tolerated well No report of cardiac concerns or symptoms Strength training completed today  Goals Unmet:  Not Applicable  Comments: Check out: 9:30    Dr. Suresh Koneswaran is Medical Director for [place] Cardiac and Pulmonary Rehab. 

## 2016-09-28 ENCOUNTER — Encounter (HOSPITAL_COMMUNITY)
Admission: RE | Admit: 2016-09-28 | Discharge: 2016-09-28 | Disposition: A | Discharge status: Home / Self Care | Payer: Medicare Other | Source: Ambulatory Visit | Attending: Cardiology | Admitting: Cardiology

## 2016-09-28 DIAGNOSIS — Z86718 Personal history of other venous thrombosis and embolism: Secondary | ICD-10-CM | POA: Diagnosis not present

## 2016-09-28 DIAGNOSIS — Z9889 Other specified postprocedural states: Secondary | ICD-10-CM

## 2016-09-28 DIAGNOSIS — I5032 Chronic diastolic (congestive) heart failure: Secondary | ICD-10-CM | POA: Diagnosis not present

## 2016-09-28 DIAGNOSIS — K219 Gastro-esophageal reflux disease without esophagitis: Secondary | ICD-10-CM | POA: Diagnosis not present

## 2016-09-28 DIAGNOSIS — Z7901 Long term (current) use of anticoagulants: Secondary | ICD-10-CM | POA: Diagnosis not present

## 2016-09-28 NOTE — Progress Notes (Signed)
Daily Session Note  Patient Details  Name: BEE MARCHIANO MRN: [id number] Date of Birth: 01-Oct-1940 Referring Provider:     CARDIAC REHAB PHASE II ORIENTATION from 09/18/2016 in [place]  Referring Provider  Dr. [name]      Encounter Date: 09/28/2016  Check In:     Session Check In - 09/28/16 0827      Check-In   Location [facility]   Staff Present Aundra Dubin, RN, BSN;Jon Kasparek Luther Parody, BS, EP, Exercise Physiologist   Supervising physician immediately available to respond to emergencies See telemetry face sheet for immediately available MD   Medication changes reported     No   Fall or balance concerns reported    No   Warm-up and Cool-down Performed as group-led instruction   Resistance Training Performed Yes   VAD Patient? No     Pain Assessment   Currently in Pain? No/denies   Pain Score 0-No pain   Multiple Pain Sites No      Capillary Blood Glucose: No results found for this or any previous visit (from the past 24 hour(s)).    History  Smoking Status  . Never Smoker  Smokeless Tobacco  . Never Used    Goals Met:  Independence with exercise equipment Exercise tolerated well No report of cardiac concerns or symptoms Strength training completed today  Goals Unmet:  Not Applicable  Comments: Check out 915   Dr. Kate Sable is Medical Director for [place] and Pulmonary Rehab.

## 2016-10-01 ENCOUNTER — Encounter (HOSPITAL_COMMUNITY)
Admission: RE | Admit: 2016-10-01 | Discharge: 2016-10-01 | Disposition: A | Discharge status: Home / Self Care | Payer: Medicare Other | Source: Ambulatory Visit | Attending: Cardiology | Admitting: Cardiology

## 2016-10-01 DIAGNOSIS — Z86718 Personal history of other venous thrombosis and embolism: Secondary | ICD-10-CM | POA: Diagnosis not present

## 2016-10-01 DIAGNOSIS — K219 Gastro-esophageal reflux disease without esophagitis: Secondary | ICD-10-CM | POA: Diagnosis not present

## 2016-10-01 DIAGNOSIS — Z9889 Other specified postprocedural states: Secondary | ICD-10-CM | POA: Diagnosis not present

## 2016-10-01 DIAGNOSIS — I5032 Chronic diastolic (congestive) heart failure: Secondary | ICD-10-CM | POA: Diagnosis not present

## 2016-10-01 DIAGNOSIS — Z7901 Long term (current) use of anticoagulants: Secondary | ICD-10-CM | POA: Diagnosis not present

## 2016-10-01 NOTE — Progress Notes (Signed)
Daily Session Note  Patient Details  Name: [name] [name] MRN: [id number] Date of Birth: March 16, 1941 Referring Provider:     CARDIAC REHAB PHASE II ORIENTATION from 09/18/2016 in [facility]  Referring Provider  Dr. [name]      Encounter Date: 10/01/2016  Check In:     Session Check In - 10/01/16 0824      Check-In   Location [facility]   Staff Present Aundra Dubin, RN, BSN;Devontae Casasola Luther Parody, BS, EP, Exercise Physiologist   Supervising physician immediately available to respond to emergencies See telemetry face sheet for immediately available MD   Medication changes reported     No   Fall or balance concerns reported    No   Warm-up and Cool-down Performed as group-led instruction   Resistance Training Performed Yes   VAD Patient? No     Pain Assessment   Currently in Pain? No/denies   Pain Score 0-No pain   Multiple Pain Sites No      Capillary Blood Glucose: No results found for this or any previous visit (from the past 24 hour(s)).    History  Smoking Status  . Never Smoker  Smokeless Tobacco  . Never Used    Goals Met:  Independence with exercise equipment Exercise tolerated well No report of cardiac concerns or symptoms Strength training completed today  Goals Unmet:  Not Applicable  Comments: Check out 915   Dr. Kate Sable is Medical Director for [facility] and [facility].

## 2016-10-02 ENCOUNTER — Ambulatory Visit (INDEPENDENT_AMBULATORY_CARE_PROVIDER_SITE_OTHER): Payer: Medicare Other | Admitting: *Deleted

## 2016-10-02 DIAGNOSIS — Z9889 Other specified postprocedural states: Secondary | ICD-10-CM | POA: Diagnosis not present

## 2016-10-02 DIAGNOSIS — I481 Persistent atrial fibrillation: Secondary | ICD-10-CM

## 2016-10-02 DIAGNOSIS — I4819 Other persistent atrial fibrillation: Secondary | ICD-10-CM

## 2016-10-02 DIAGNOSIS — Z5181 Encounter for therapeutic drug level monitoring: Secondary | ICD-10-CM | POA: Diagnosis not present

## 2016-10-02 LAB — POCT INR: INR: 2.4

## 2016-10-03 ENCOUNTER — Encounter (HOSPITAL_COMMUNITY)
Admission: RE | Admit: 2016-10-03 | Discharge: 2016-10-03 | Disposition: A | Discharge status: Home / Self Care | Payer: Medicare Other | Source: Ambulatory Visit | Attending: Cardiology | Admitting: Cardiology

## 2016-10-03 DIAGNOSIS — Z9889 Other specified postprocedural states: Secondary | ICD-10-CM

## 2016-10-03 DIAGNOSIS — Z86718 Personal history of other venous thrombosis and embolism: Secondary | ICD-10-CM | POA: Diagnosis not present

## 2016-10-03 DIAGNOSIS — Z7901 Long term (current) use of anticoagulants: Secondary | ICD-10-CM | POA: Diagnosis not present

## 2016-10-03 DIAGNOSIS — K219 Gastro-esophageal reflux disease without esophagitis: Secondary | ICD-10-CM | POA: Diagnosis not present

## 2016-10-03 DIAGNOSIS — I5032 Chronic diastolic (congestive) heart failure: Secondary | ICD-10-CM | POA: Diagnosis not present

## 2016-10-03 NOTE — Progress Notes (Signed)
Daily Session Note  Patient Details  Name: SHAKEIRA RHEE MRN: [id number] Date of Birth: 1941/02/17 Referring Provider:     CARDIAC REHAB PHASE II ORIENTATION from 09/18/2016 in [facility]  Referring Provider  Dr. [name]      Encounter Date: 10/03/2016  Check In:     Session Check In - 10/03/16 0826      Check-In   Location [facility]   Staff Present Aundra Dubin, RN, BSN;Dezi Brauner Luther Parody, BS, EP, Exercise Physiologist   Supervising physician immediately available to respond to emergencies See telemetry face sheet for immediately available MD   Medication changes reported     No   Fall or balance concerns reported    No   Warm-up and Cool-down Performed as group-led instruction   Resistance Training Performed Yes   VAD Patient? No     Pain Assessment   Currently in Pain? No/denies   Pain Score 0-No pain   Multiple Pain Sites No      Capillary Blood Glucose: Results for orders placed or performed in visit on 10/02/16 (from the past 24 hour(s))  POCT INR     Status: Abnormal   Collection Time: 10/02/16  8:48 AM  Result Value Ref Range   INR 2.4       History  Smoking Status  . Never Smoker  Smokeless Tobacco  . Never Used    Goals Met:  Independence with exercise equipment Exercise tolerated well No report of cardiac concerns or symptoms Strength training completed today  Goals Unmet:  Not Applicable  Comments: Check out 915   Dr. Kate Sable is Medical Director for [facility] and [facility].

## 2016-10-04 DIAGNOSIS — E7800 Pure hypercholesterolemia, unspecified: Secondary | ICD-10-CM | POA: Diagnosis not present

## 2016-10-04 DIAGNOSIS — I4891 Unspecified atrial fibrillation: Secondary | ICD-10-CM | POA: Diagnosis not present

## 2016-10-04 DIAGNOSIS — J449 Chronic obstructive pulmonary disease, unspecified: Secondary | ICD-10-CM | POA: Diagnosis not present

## 2016-10-04 DIAGNOSIS — M159 Polyosteoarthritis, unspecified: Secondary | ICD-10-CM | POA: Diagnosis not present

## 2016-10-05 ENCOUNTER — Encounter (HOSPITAL_COMMUNITY)
Admission: RE | Admit: 2016-10-05 | Discharge: 2016-10-05 | Disposition: A | Discharge status: Home / Self Care | Payer: Medicare Other | Source: Ambulatory Visit | Attending: Cardiology | Admitting: Cardiology

## 2016-10-05 DIAGNOSIS — Z7901 Long term (current) use of anticoagulants: Secondary | ICD-10-CM | POA: Diagnosis not present

## 2016-10-05 DIAGNOSIS — Z9889 Other specified postprocedural states: Secondary | ICD-10-CM | POA: Diagnosis not present

## 2016-10-05 DIAGNOSIS — I5032 Chronic diastolic (congestive) heart failure: Secondary | ICD-10-CM | POA: Diagnosis not present

## 2016-10-05 DIAGNOSIS — Z86718 Personal history of other venous thrombosis and embolism: Secondary | ICD-10-CM | POA: Diagnosis not present

## 2016-10-05 DIAGNOSIS — K219 Gastro-esophageal reflux disease without esophagitis: Secondary | ICD-10-CM | POA: Diagnosis not present

## 2016-10-05 NOTE — Progress Notes (Signed)
Daily Session Note  Patient Details  Name: [name] [name] MRN: [id number] Date of Birth: 11/18/1940 Referring Provider:     CARDIAC REHAB PHASE II ORIENTATION from 09/18/2016 in [facility]  Referring Provider  Dr. [name]      Encounter Date: 10/05/2016  Check In:     Session Check In - 10/05/16 0835      Check-In   Location [facility]   Staff Present Aundra Dubin, RN, BSN;Francesco Provencal Luther Parody, BS, EP, Exercise Physiologist   Supervising physician immediately available to respond to emergencies See telemetry face sheet for immediately available MD   Medication changes reported     No   Fall or balance concerns reported    No   Warm-up and Cool-down Performed as group-led instruction   Resistance Training Performed Yes   VAD Patient? No     Pain Assessment   Currently in Pain? No/denies   Pain Score 0-No pain   Multiple Pain Sites No      Capillary Blood Glucose: No results found for this or any previous visit (from the past 24 hour(s)).      Exercise Prescription Changes - 10/04/16 1500      Response to Exercise   Blood Pressure (Admit) 120/66   Blood Pressure (Exercise) 136/68   Blood Pressure (Exit) 126/64   Heart Rate (Admit) 70 bpm   Heart Rate (Exercise) 76 bpm   Heart Rate (Exit) 76 bpm   Rating of Perceived Exertion (Exercise) 11   Duration Progress to 30 minutes of  aerobic without signs/symptoms of physical distress   Intensity THRR unchanged     Progression   Progression Continue to progress workloads to maintain intensity without signs/symptoms of physical distress.     Resistance Training   Training Prescription Yes   Weight 2   Reps 10-15     Treadmill   MPH 1.4   Grade 0   Minutes 15   METs 2     NuStep   Level 2   SPM 15   Minutes 20   METs 3.45     Home Exercise Plan   Plans to continue exercise at Home (comment)   Frequency Add 2 additional days to program exercise sessions.       History  Smoking Status  . Never Smoker  Smokeless Tobacco  . Never Used    Goals Met:  Independence with exercise equipment Exercise tolerated well No report of cardiac concerns or symptoms Strength training completed today  Goals Unmet:  Not Applicable  Comments: Check out 915   Dr. Kate Sable is Medical Director for [facility] and [facility].

## 2016-10-08 ENCOUNTER — Encounter (HOSPITAL_COMMUNITY)
Admission: RE | Admit: 2016-10-08 | Discharge: 2016-10-08 | Disposition: A | Discharge status: Home / Self Care | Payer: Medicare Other | Source: Ambulatory Visit | Attending: Cardiology | Admitting: Cardiology

## 2016-10-08 ENCOUNTER — Telehealth: Payer: Self-pay

## 2016-10-08 ENCOUNTER — Encounter: Payer: Self-pay | Admitting: Thoracic Surgery (Cardiothoracic Vascular Surgery)

## 2016-10-08 ENCOUNTER — Ambulatory Visit: Payer: Self-pay | Admitting: Cardiology

## 2016-10-08 DIAGNOSIS — K219 Gastro-esophageal reflux disease without esophagitis: Secondary | ICD-10-CM | POA: Diagnosis not present

## 2016-10-08 DIAGNOSIS — I5032 Chronic diastolic (congestive) heart failure: Secondary | ICD-10-CM | POA: Diagnosis not present

## 2016-10-08 DIAGNOSIS — Z7901 Long term (current) use of anticoagulants: Secondary | ICD-10-CM | POA: Diagnosis not present

## 2016-10-08 DIAGNOSIS — Z86718 Personal history of other venous thrombosis and embolism: Secondary | ICD-10-CM | POA: Diagnosis not present

## 2016-10-08 DIAGNOSIS — Z9889 Other specified postprocedural states: Secondary | ICD-10-CM | POA: Diagnosis not present

## 2016-10-08 NOTE — Progress Notes (Signed)
Daily Session Note  Patient Details  Name: [name] [name] MRN: [id number] Date of Birth: 1940/08/09 Referring Provider:     CARDIAC REHAB PHASE II ORIENTATION from 09/18/2016 in [facility]  Referring Provider  Dr. [name]      Encounter Date: 10/08/2016  Check In:     Session Check In - 10/08/16 0830      Check-In   Location [facility]   Staff Present Aundra Dubin, RN, BSN;Kanden Carey Luther Parody, BS, EP, Exercise Physiologist   Supervising physician immediately available to respond to emergencies See telemetry face sheet for immediately available MD   Medication changes reported     No   Fall or balance concerns reported    No   Warm-up and Cool-down Performed as group-led instruction   Resistance Training Performed Yes   VAD Patient? No     Pain Assessment   Currently in Pain? No/denies   Pain Score 0-No pain   Multiple Pain Sites No      Capillary Blood Glucose: No results found for this or any previous visit (from the past 24 hour(s)).    History  Smoking Status  . Never Smoker  Smokeless Tobacco  . Never Used    Goals Met:  Independence with exercise equipment Exercise tolerated well No report of cardiac concerns or symptoms Strength training completed today  Goals Unmet:  Not Applicable  Comments: Check out 915   Dr. Kate Sable is Medical Director for [facility] and [facility].

## 2016-10-08 NOTE — Telephone Encounter (Signed)
Spoke with pt. She is taking lasix 40 mg daily, but she will take it BID for next 3 days. I have forwarded message to front office rep. To schedule patient.

## 2016-10-08 NOTE — Telephone Encounter (Signed)
-----   Message from Arnoldo Lenis, MD sent at 10/08/2016 12:54 PM EDT ----- Regarding: FW: Weight gain. [name], can we verify [name] is taking her lasix 40mg  daily, if so increase to 40mg  bid x 3 days. She needs a PA appt toward the end of the week   [name] MD ----- Message ----- From: [name], RN Sent: 10/08/2016   8:36 AM To: Arnoldo Lenis, MD Subject: Weight gain.                                   Dr. [name],  [name] is in Cardiac Rehab for [facility]. She has gained 10 lbs in 2 weeks and she is exhibiting periorbital edema. She does have a h/o CHF. Her O2 sat is 91. Her b/p is WNL. She says her SOB is about the same. She also has COPD.   Please Advise.  Thanks!  Lisabeth Register, RN

## 2016-10-10 ENCOUNTER — Encounter (HOSPITAL_COMMUNITY)
Admission: RE | Admit: 2016-10-10 | Discharge: 2016-10-10 | Disposition: A | Discharge status: Home / Self Care | Payer: Medicare Other | Source: Ambulatory Visit | Attending: Cardiology | Admitting: Cardiology

## 2016-10-10 ENCOUNTER — Ambulatory Visit (INDEPENDENT_AMBULATORY_CARE_PROVIDER_SITE_OTHER): Payer: Medicare Other | Admitting: Physician Assistant

## 2016-10-10 ENCOUNTER — Encounter: Payer: Self-pay | Admitting: Physician Assistant

## 2016-10-10 VITALS — BP 118/60 | HR 53 | Ht 62.0 in | Wt 154.0 lb

## 2016-10-10 DIAGNOSIS — Z7901 Long term (current) use of anticoagulants: Secondary | ICD-10-CM | POA: Diagnosis not present

## 2016-10-10 DIAGNOSIS — Z9889 Other specified postprocedural states: Secondary | ICD-10-CM

## 2016-10-10 DIAGNOSIS — Z79899 Other long term (current) drug therapy: Secondary | ICD-10-CM | POA: Diagnosis not present

## 2016-10-10 DIAGNOSIS — R0602 Shortness of breath: Secondary | ICD-10-CM

## 2016-10-10 DIAGNOSIS — K219 Gastro-esophageal reflux disease without esophagitis: Secondary | ICD-10-CM | POA: Diagnosis not present

## 2016-10-10 DIAGNOSIS — Z86718 Personal history of other venous thrombosis and embolism: Secondary | ICD-10-CM | POA: Diagnosis not present

## 2016-10-10 DIAGNOSIS — I481 Persistent atrial fibrillation: Secondary | ICD-10-CM

## 2016-10-10 DIAGNOSIS — I5032 Chronic diastolic (congestive) heart failure: Secondary | ICD-10-CM | POA: Diagnosis not present

## 2016-10-10 DIAGNOSIS — I4819 Other persistent atrial fibrillation: Secondary | ICD-10-CM

## 2016-10-10 NOTE — Progress Notes (Signed)
Cardiology Office Note    Date:  10/10/2016   ID:  [name] [name], DOB 09-17-1940, MRN [id number]  PCP:  Glenda Chroman, MD  Cardiologist: Dr. [name]   Chief Complaint  Patient presents with  . Leg Swelling    History of Present Illness:  [name] [name] is a 76 y.o. female status post mitral valve repair and maze procedure 07/05/16, echo 08/2016 LVEF 60-65% with trivial MR, moderate TR and moderate pulmonary hypertension, PAF with accelerated junctional rhythm treated with increase in amiodarone beta blocker, chronic diastolic CHF, COPD.  Patient last saw Dr. [name] 08/27/16 and doing well.Plan was to hopefully stop amiodarone at the next office visit. Weight was 150 pounds that day. Patient was doing cardiac rehabilitation and we received a call that she's had a 10 pound weight gain with increasing edema. Lasix was increased to 40 mg twice a day for 3 days and she was added onto my schedule.  Patient admits to eating [employer] over the weekend. Since taking extra her swelling is down and she's lost 6 lbs. She's having worsening trouble with wheezing and breathing- always in the summer.   Past Medical History:  Diagnosis Date  . Anxiety   . Arthritis   . Asthma   . Atrial fibrillation, persistent ([facility])   . Chronic diastolic congestive heart failure ([facility])   . Colon adenoma   . Colon cancer ([facility])    status post low anterior resection, limited stage disease requiring no adjuvant therapy  . COPD (chronic obstructive pulmonary disease) ([facility])   . Depression   . Diverticulosis   . DM (dermatomyositis)   . DVT (deep venous thrombosis) ([name])    in leg- long time ago  . Dyspnea    with activity  . Dysrhythmia   . Esophageal dysphagia   . GERD (gastroesophageal reflux disease)   . Headache   . Heart murmur   . Hematuria   . Hemorrhoids   . Hiatal hernia   . History of kidney stones    x 2  . Hypercholesterolemia   . Incidental pulmonary nodule 05/08/2016   8 mm vague  opacity RML noted on CT scan  . Mitral regurgitation   . PONV (postoperative nausea and vomiting) 2003 ish    with breast biopsy  . S/P minimally invasive maze operation for atrial fibrillation 07/05/2016   Complete bilateral atrial lesion set using cryothermy and bipolar radiofrequency ablation with clipping of LA appendage via right mini thoracotomy approach  . 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 Schatzki's ring   . Tricuspid regurgitation     Past Surgical History:  Procedure Laterality Date  . APPENDECTOMY    . BREAST SURGERY Right 2003ish   biopsy  . CARDIAC CATHETERIZATION N/A 04/12/2016   Procedure: Right/Left Heart Cath and Coronary Angiography;  Surgeon: Leonie Man, MD;  Location: [facility];  Service: Cardiovascular;  Laterality: N/A;  . CATARACT EXTRACTION Bilateral 2017  . CLIPPING OF ATRIAL APPENDAGE  07/05/2016   Procedure: CLIPPING OF ATRIAL APPENDAGE;  Surgeon: Rexene Alberts, MD;  Location: [facility];  Service: Open Heart Surgery;;  . COLONOSCOPY  11/09   Dr. Gala Romney- external hemorrhoidal tags o/w normal rectal mucosa, s/p surgical resection with a normal appearing anastomosis 12cm, pan colonic diverticulum  . COLONOSCOPY N/A 06/02/2012   QIO:NGEXBM post  low anterior resection. Pancolonic diverticulosis. Colonic polyp-tubular adenoma. Surveillance due 2019.   Marland Kitchen COLONOSCOPY N/A 10/13/2015   Procedure: COLONOSCOPY;  Surgeon: Daneil Dolin, MD;  [name];  Service: Endoscopy;  Laterality: N/A;  0930  . ESOPHAGOGASTRODUODENOSCOPY  07/2007   Dr. Daiva Nakayama cervical esophageal web, schatzki ring, large hiatal hernia  . INGUNAL HERNIA REPAIR Left   . LOW ANTERIOR BOWEL RESECTION     NO ADJ CHEMO  . MINIMALLY INVASIVE MAZE PROCEDURE N/A 07/05/2016   Procedure: MINIMALLY INVASIVE  MAZE PROCEDURE;  Surgeon: Rexene Alberts, MD;  Location: [facility];  Service: Open Heart Surgery;  Laterality: N/A;  . MITRAL VALVE REPAIR Right 07/05/2016   Procedure: MINIMALLY INVASIVE MITRAL VALVE REPAIR (MVR);  Surgeon: Rexene Alberts, MD;  Location: [facility];  Service: Open Heart Surgery;  Laterality: Right;  . MULTIPLE EXTRACTIONS WITH ALVEOLOPLASTY N/A 05/21/2016   Procedure: MULTIPLE EXTRACTION OF TOOTH #'S 5, 21 WITH ALVEOLOPLASTY AND GROSS DEBRIDEMENT OF TEETH;  Surgeon: Lenn Cal, DDS;  Location: [facility];  Service: Oral Surgery;  Laterality: N/A;  . TEE WITHOUT CARDIOVERSION N/A 02/20/2016   Procedure: TRANSESOPHAGEAL ECHOCARDIOGRAM (TEE);  Surgeon: Arnoldo Lenis, MD;  [name];  Service: Endoscopy;  Laterality: N/A;  . TEE WITHOUT CARDIOVERSION N/A 07/05/2016   Procedure: TRANSESOPHAGEAL ECHOCARDIOGRAM (TEE);  Surgeon: Rexene Alberts, MD;  Location: [facility];  Service: Open Heart Surgery;  Laterality: N/A;  . TOOTH EXTRACTION    . TUBAL LIGATION    . VEIN LIGATION AND STRIPPING      Current Medications: Outpatient Medications Prior to Visit  Medication Sig Dispense Refill  . acetaminophen (TYLENOL) 325 MG tablet Take 650 mg by mouth every 6 (six) hours as needed for pain.    Marland Kitchen albuterol (PROVENTIL HFA;VENTOLIN HFA) 108 (90 BASE) MCG/ACT inhaler Inhale 2 puffs into the lungs every 6 (six) hours as needed for wheezing.    Marland Kitchen ALPRAZolam (XANAX) 0.5 MG tablet Take 0.5 mg by mouth 2 (two) times daily as needed (for anxiety/sleep (scheduled at bedtime)).     Marland Kitchen aspirin EC 81 MG EC tablet Take 1 tablet (81 mg total) by mouth daily.    . calcium carbonate (OS-CAL) 600 MG TABS tablet Take 600 mg by mouth 2 (two) times daily with a meal.    . Coenzyme Q10 100 MG TABS Take 100 mg by mouth every evening.     . docusate sodium (COLACE) 100 MG capsule Take 200 mg by mouth daily as needed for mild constipation.     . furosemide (LASIX) 40 MG tablet Take 1 tablet (40 mg total) by  mouth daily. 30 tablet 1  . hydrocortisone (ANUSOL-HC) 2.5 % rectal cream Place 1 application rectally 2 (two) times daily. FOR UP TO 10 DAYS AT A TIME (Patient taking differently: Place 1 application rectally 2 (two) times daily as needed for hemorrhoids or itching. FOR UP TO 10 DAYS AT A TIME) 30 g 1  . metoprolol succinate (TOPROL-XL) 25 MG 24 hr tablet Take 25 mg by mouth 2 (two) times daily.    . Multiple Vitamin (MULTIVITAMIN) tablet Take 1 tablet by mouth daily.      Marland Kitchen omeprazole (PRILOSEC) 20 MG capsule Take 1 capsule (20 mg total) by mouth daily. 30 capsule 3  . PARoxetine (PAXIL) 20 MG tablet Take 20 mg by mouth daily.     Vladimir Faster Glycol-Propyl Glycol (SYSTANE ULTRA) 0.4-0.3 % SOLN Place 1 drop into both eyes 3 (  three) times daily.    . potassium chloride SA (K-DUR,KLOR-CON) 20 MEQ tablet Take 1 tablet (20 mEq total) by mouth daily. 30 tablet 1  . sodium chloride (OCEAN) 0.65 % SOLN nasal spray Place 1 spray into both nostrils as needed for congestion.    . traMADol (ULTRAM) 50 MG tablet Take 50 mg by mouth every 4-6 hours PRN severe pain. 30 tablet 0  . umeclidinium-vilanterol (ANORO ELLIPTA) 62.5-25 MCG/INH AEPB Inhale 1 puff into the lungs daily. 30 each 6  . warfarin (COUMADIN) 2.5 MG tablet TAKE 1 TABLET BY MOUTH DAILY AT 6PM 36 tablet 3  . amiodarone (PACERONE) 200 MG tablet Take 1 tablet (200 mg total) by mouth daily. TAKE 200 MG DAILY 180 tablet 1   No facility-administered medications prior to visit.      Allergies:   Iohexol; Hydrocodone-acetaminophen; Nabumetone; and Prednisone   Social History   Social History  . Marital status: Married    Spouse name: N/A  . Number of children: 2  . Years of education: N/A   Social History Main Topics  . Smoking status: Never Smoker  . Smokeless tobacco: Never Used  . Alcohol use No     Comment: Rarely  . Drug use: No  . Sexual activity: Not Asked   Other Topics Concern  . None   Social History Narrative  . None      Family History:  The patient's   family history includes Aneurysm in her brother; Brain cancer in her sister; Breast cancer in her mother; Cancer - Lung in her sister; Prostate cancer in her brother; Rheum arthritis in her father.   ROS:   Please see the history of present illness.    Review of Systems  Cardiovascular: Positive for dyspnea on exertion and leg swelling.  Respiratory: Positive for wheezing.    All other systems reviewed and are negative.   PHYSICAL EXAM:   VS:  BP 118/60   Pulse (!) 53   Ht 5\' 2"  (1.575 m)   Wt 154 lb (69.9 kg)   SpO2 93%   BMI 28.17 kg/m   Physical Exam  GEN: Well nourished, well developed, in no acute distress  Neck: no JVD, carotid bruits, or masses Cardiac:RRR; no murmurs, rubs, or gallops  Respiratory:  Decreased breath sounds with inspiratory wheezing in the upper lung fields GI: soft, nontender, nondistended, + BS Ext: Trace of edema bilaterally without cyanosis, clubbing,  Good distal pulses bilaterally Neuro:  Alert and Oriented x 3 Psych: euthymic mood, full affect  Wt Readings from Last 3 Encounters:  10/10/16 154 lb (69.9 kg)  09/18/16 148 lb 4.8 oz (67.3 kg)  08/27/16 150 lb 9.6 oz (68.3 kg)      Studies/Labs Reviewed:   EKG:  EKG is ordered today.  The ekg ordered today demonstrates Sinus bradycardia  Recent Labs: 07/03/2016: ALT 19 07/27/2016: BUN 10; Creatinine, Ser 0.80; Hemoglobin 9.9; Magnesium 2.0; Platelets 469; Potassium 3.9; Sodium 137; TSH 3.327   Lipid Panel No results found for: CHOL, TRIG, HDL, CHOLHDL, VLDL, LDLCALC, LDLDIRECT  Additional studies/ records that were reviewed today include:   08/22/16 echo  Study Conclusions   - Left ventricle: The cavity size was normal. Wall thickness was   normal. Systolic function was normal. The estimated ejection   fraction was in the range of 60% to 65%. Wall motion was normal;   there were no regional wall motion abnormalities. The study is   not technically  sufficient to allow  evaluation of LV diastolic   function. - Aortic valve: Mildly calcified annulus. Trileaflet. Valve area   (VTI): 1.6 cm^2. Valve area (Vmax): 1.59 cm^2. Valve area   (Vmean): 1.61 cm^2. - Mitral valve: Status post complex valvuloplasty including   artificial Gore-tex neochord placement and Sorin Memo 3D Ring   Annuloplasty (size 41mm, catalog #[id number], serial [id number]). Mildly   thickened leaflets. There was trivial regurgitation. Mean   gradient (D): 4 mm Hg. - Left atrium: The atrium was mildly to moderately dilated. - Right ventricle: The cavity size was mildly dilated. - Right atrium: The atrium was at the upper limits of normal in   size. Central venous pressure (est): 3 mm Hg. - Atrial septum: No defect or patent foramen ovale was identified. - Tricuspid valve: There was moderate regurgitation. - Pulmonary arteries: Systolic pressure was moderately increased.   PA peak pressure: 50 mm Hg (S). - Pericardium, extracardiac: There was no pericardial effusion.   Impressions:   - Normal LV wall thickness with LVEF 60-65%. Indeterminate   diastolic function. Mild to moderate left atrial enlargement.   Status post mitral valve repair and annuloplasty as indicated   with mildly thickened leaflets and trivial mitral regurgitation.   Mean gradient 4 mmHg. Mild right ventricular enlargement. At   least moderate tricuspid regurgitation noted. Moderate pulmonary   hypertension with PASP 50 mmHg.   01/2016 echo Study Conclusions   - Left ventricle: The cavity size was normal. Wall thickness was   normal. Systolic function was normal. The estimated ejection   fraction was in the range of 60% to 65%. Wall motion was normal;   there were no regional wall motion abnormalities. The study is   not technically sufficient to allow evaluation of LV diastolic   function. Doppler parameters are consistent with high ventricular   filling pressure. - Mitral valve: Calcified  annulus. There was moderate to severe   (closer to severe) eccentric regurgitation. - Left atrium: The atrium was severely dilated. - Right atrium: The atrium was moderately dilated. - Tricuspid valve: There was moderate-severe regurgitation. - Pulmonary arteries: PA peak pressure: 40 mm Hg (S).     01/2016 TEE eccentric moderate to severe MR. Moderate to severe TR. F/u full report for final findings.   08/22/16 echo  Study Conclusions   - Left ventricle: The cavity size was normal. Wall thickness was   normal. Systolic function was normal. The estimated ejection   fraction was in the range of 60% to 65%. Wall motion was normal;   there were no regional wall motion abnormalities. The study is   not technically sufficient to allow evaluation of LV diastolic   function. - Aortic valve: Mildly calcified annulus. Trileaflet. Valve area   (VTI): 1.6 cm^2. Valve area (Vmax): 1.59 cm^2. Valve area   (Vmean): 1.61 cm^2. - Mitral valve: Status post complex valvuloplasty including   artificial Gore-tex neochord placement and Sorin Memo 3D Ring   Annuloplasty (size 9mm, catalog #[id number], serial [id number]). Mildly   thickened leaflets. There was trivial regurgitation. Mean   gradient (D): 4 mm Hg. - Left atrium: The atrium was mildly to moderately dilated. - Right ventricle: The cavity size was mildly dilated. - Right atrium: The atrium was at the upper limits of normal in   size. Central venous pressure (est): 3 mm Hg. - Atrial septum: No defect or patent foramen ovale was identified. - Tricuspid valve: There was moderate regurgitation. - Pulmonary arteries: Systolic pressure was moderately increased.  PA peak pressure: 50 mm Hg (S). - Pericardium, extracardiac: There was no pericardial effusion.   Impressions:   - Normal LV wall thickness with LVEF 60-65%. Indeterminate   diastolic function. Mild to moderate left atrial enlargement.   Status post mitral valve repair and annuloplasty as  indicated   with mildly thickened leaflets and trivial mitral regurgitation.   Mean gradient 4 mmHg. Mild right ventricular enlargement. At   least moderate tricuspid regurgitation noted. Moderate pulmonary   hypertension with PASP 50 mmHg.      ASSESSMENT:    1. Chronic diastolic congestive heart failure ([facility])   2. S/P minimally invasive mitral valve repair and maze procedure   3. Atrial fibrillation, persistent ([facility])   4. SOB (shortness of breath)   5. Drug therapy      PLAN:  In order of problems listed above:  Chronic diastolic CHF with recent exacerbation most likely due to dietary indiscretion. Symptoms have improved after Lasix 40 mg twice a day for 2 days. Will take again today. She's lost 6 pounds. 2 g sodium diet. Reduce Lasix to 40 mg once daily starting tomorrow. Check renal function today. Follow-up with Dr. [name] in 2 months.  Status post minimally invasive MVR Maze procedure 06/2016 doing well  Atrial fibrillationOn amiodarone and metoprolol. We'll stop amiodarone today per Dr. Nelly Laurence last note.. This may also help with her pulmonary problems.  Shortness of breath most likely secondary to COPD with wheezing. Patient states this is always worse in the summer. Will refer to Dr. Luan Pulling for pulmonary evaluation.  Drug therapy check renal function today after extra lasix  Medication Adjustments/Labs and Tests Ordered: Current medicines are reviewed at length with the patient today.  Concerns regarding medicines are outlined above.  Medication changes, Labs and Tests ordered today are listed in the Patient Instructions below. Patient Instructions  Medication Instructions:  STOP AMIODARONE TAKE LASIX 40 MG DAILY   Labwork: TODAY   Testing/Procedures: NONE  Follow-Up: Your physician recommends that you schedule a follow-up appointment in: 2 MONTHS    Any Other Special Instructions Will Be Listed Below (If Applicable). You have been referred to DR. HAWKINS,  SOMEONE FORM HIS OFFICE WILL CONTACT YOU SOON.     If you need a refill on your cardiac medications before your next appointment, please call your pharmacy.      Sumner Boast, PA-C  10/10/2016 11:32 AM    [facility] [name], [place], [place]  [address] Phone: [phone]; Fax: [phone]

## 2016-10-10 NOTE — Progress Notes (Signed)
Cardiac Individual Treatment Plan  Patient Details  Name: [name] [name] MRN: [id number] Date of Birth: Aug 09, 1940 Referring Provider:     [place] from 09/18/2016 in [place]  Referring Provider  Dr. [name]      Initial Encounter Date:    CARDIAC REHAB PHASE II ORIENTATION from 09/18/2016 in [place]  Date  09/18/16  Referring Provider  Dr. [name]      Visit Diagnosis: S/P mitral valve repair  Patient's Home Medications on Admission:  Current Outpatient Prescriptions:  .  acetaminophen (TYLENOL) 325 MG tablet, Take 650 mg by mouth every 6 (six) hours as needed for pain., Disp: , Rfl:  .  albuterol (PROVENTIL HFA;VENTOLIN HFA) 108 (90 BASE) MCG/ACT inhaler, Inhale 2 puffs into the lungs every 6 (six) hours as needed for wheezing., Disp: , Rfl:  .  ALPRAZolam (XANAX) 0.5 MG tablet, Take 0.5 mg by mouth 2 (two) times daily as needed (for anxiety/sleep (scheduled at bedtime)). , Disp: , Rfl:  .  aspirin EC 81 MG EC tablet, Take 1 tablet (81 mg total) by mouth daily., Disp: , Rfl:  .  calcium carbonate (OS-CAL) 600 MG TABS tablet, Take 600 mg by mouth 2 (two) times daily with a meal., Disp: , Rfl:  .  Coenzyme Q10 100 MG TABS, Take 100 mg by mouth every evening. , Disp: , Rfl:  .  docusate sodium (COLACE) 100 MG capsule, Take 200 mg by mouth daily as needed for mild constipation. , Disp: , Rfl:  .  furosemide (LASIX) 40 MG tablet, Take 1 tablet (40 mg total) by mouth daily., Disp: 30 tablet, Rfl: 1 .  hydrocortisone (ANUSOL-HC) 2.5 % rectal cream, Place 1 application rectally 2 (two) times daily. FOR UP TO 10 DAYS AT A TIME (Patient taking differently: Place 1 application rectally 2 (two) times daily as needed for hemorrhoids or itching. FOR UP TO 10 DAYS AT A TIME), Disp: 30 g, Rfl: 1 .  metoprolol succinate (TOPROL-XL) 25 MG 24 hr tablet, Take 25 mg by mouth 2 (two) times daily., Disp: , Rfl:  .  Multiple  Vitamin (MULTIVITAMIN) tablet, Take 1 tablet by mouth daily.  , Disp: , Rfl:  .  omeprazole (PRILOSEC) 20 MG capsule, Take 1 capsule (20 mg total) by mouth daily., Disp: 30 capsule, Rfl: 3 .  PARoxetine (PAXIL) 20 MG tablet, Take 20 mg by mouth daily. , Disp: , Rfl:  .  Polyethyl Glycol-Propyl Glycol (SYSTANE ULTRA) 0.4-0.3 % SOLN, Place 1 drop into both eyes 3 (three) times daily., Disp: , Rfl:  .  potassium chloride SA (K-DUR,KLOR-CON) 20 MEQ tablet, Take 1 tablet (20 mEq total) by mouth daily., Disp: 30 tablet, Rfl: 1 .  sodium chloride (OCEAN) 0.65 % SOLN nasal spray, Place 1 spray into both nostrils as needed for congestion., Disp: , Rfl:  .  traMADol (ULTRAM) 50 MG tablet, Take 50 mg by mouth every 4-6 hours PRN severe pain., Disp: 30 tablet, Rfl: 0 .  umeclidinium-vilanterol (ANORO ELLIPTA) 62.5-25 MCG/INH AEPB, Inhale 1 puff into the lungs daily., Disp: 30 each, Rfl: 6 .  warfarin (COUMADIN) 2.5 MG tablet, TAKE 1 TABLET BY MOUTH DAILY AT 6PM, Disp: 36 tablet, Rfl: 3  Past Medical History: Past Medical History:  Diagnosis Date  . Anxiety   . Arthritis   . Asthma   . Atrial fibrillation, persistent ([place])   . Chronic diastolic congestive heart failure ([place])   . Colon adenoma   .  Colon cancer ([place])    status post low anterior resection, limited stage disease requiring no adjuvant therapy  . COPD (chronic obstructive pulmonary disease) ([place])   . Depression   . Diverticulosis   . DM (dermatomyositis)   . DVT (deep venous thrombosis) ([name])    in leg- long time ago  . Dyspnea    with activity  . Dysrhythmia   . Esophageal dysphagia   . GERD (gastroesophageal reflux disease)   . Headache   . Heart murmur   . Hematuria   . Hemorrhoids   . Hiatal hernia   . History of kidney stones    x 2  . Hypercholesterolemia   . Incidental pulmonary nodule 05/08/2016   8 mm vague opacity RML noted on CT scan  . Mitral regurgitation   . PONV (postoperative nausea and vomiting) 2003 ish     with breast biopsy  . S/P minimally invasive maze operation for atrial fibrillation 07/05/2016   Complete bilateral atrial lesion set using cryothermy and bipolar radiofrequency ablation with clipping of LA appendage via right mini thoracotomy approach  . 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 Schatzki's ring   . Tricuspid regurgitation     Tobacco Use: History  Smoking Status  . Never Smoker  Smokeless Tobacco  . Never Used    Labs: Recent Review Flowsheet Data    Labs for ITP Cardiac and Pulmonary Rehab Latest Ref Rng & Units 07/05/2016 07/06/2016 07/06/2016 07/06/2016 07/06/2016   Hemoglobin A1c 4.8 - 5.6 % - - - - -   PHART 7.350 - 7.450 7.278(L) 7.288(L) 7.246(L) 7.330(L) -   PCO2ART 32.0 - 48.0 mmHg 54.1(H) 55.1(H) 63.4(H) 46.7 -   HCO3 20.0 - 28.0 mmol/L 25.4 26.4 26.1 24.9 -   TCO2 0 - 100 mmol/L _0 ACIDBASEDEF 0.0 - 2.0 mmol/L 2.0 1.0 - 1.0 -   O2SAT % 96.0 98.0 98.0 97.0 -      Capillary Blood Glucose: Lab Results  Component Value Date   GLUCAP 119 (H) 07/09/2016   GLUCAP 110 (H) 07/08/2016   GLUCAP 98 07/08/2016   GLUCAP 97 07/08/2016   GLUCAP 109 (H) 07/08/2016     Exercise Target Goals:    Exercise Program Goal: Individual exercise prescription set with THRR, safety & activity barriers. Participant demonstrates ability to understand and report RPE using BORG scale, to self-measure pulse accurately, and to acknowledge the importance of the exercise prescription.  Exercise Prescription Goal: Starting with aerobic activity 30 plus minutes a day, 3 days per week for initial exercise prescription. Provide home exercise prescription and guidelines that participant acknowledges understanding prior to discharge.  Activity Barriers & Risk Stratification:   6 Minute Walk:     6  Minute Walk    Row Name 09/18/16 1432         6 Minute Walk   Phase Initial     Distance 1200 feet     Distance % Change 0 %     Walk Time 6 minutes     # of Rest Breaks 0     MPH 2.27     METS 2.74     RPE 11     Perceived Dyspnea  13     VO2 Peak 9.04     Symptoms No     Resting  HR 74 bpm     Resting BP 122/64     Max Ex. HR 114 bpm     Max Ex. BP 146/70     2 Minute Post BP 130/60        Oxygen Initial Assessment:   Oxygen Re-Evaluation:   Oxygen Discharge (Final Oxygen Re-Evaluation):   Initial Exercise Prescription:     Initial Exercise Prescription - 09/18/16 1400      Date of Initial Exercise RX and Referring Provider   Date 09/18/16   Referring Provider Dr. [name]     Treadmill   MPH 1.3   Grade 0   Minutes 15   METs 1.9     NuStep   Level 2   SPM 10   Minutes 20   METs 1.7     Prescription Details   Frequency (times per week) 3   Duration Progress to 30 minutes of continuous aerobic without signs/symptoms of physical distress     Intensity   THRR 40-80% of Max Heartrate 102-116-130   Ratings of Perceived Exertion 11-13   Perceived Dyspnea 0-4     Progression   Progression Continue progressive overload as per policy without signs/symptoms or physical distress.     Resistance Training   Training Prescription Yes   Weight 1   Reps 10-15      Perform Capillary Blood Glucose checks as needed.  Exercise Prescription Changes:      Exercise Prescription Changes    Row Name 09/24/16 1400 10/04/16 1500           Response to Exercise   Blood Pressure (Admit) 128/66 120/66      Blood Pressure (Exercise) 126/68 136/68      Blood Pressure (Exit) 126/64 126/64      Heart Rate (Admit) 89 bpm 70 bpm      Heart Rate (Exercise) 88 bpm 76 bpm      Heart Rate (Exit) 87 bpm 76 bpm      Rating of Perceived Exertion (Exercise) 13 11      Duration Progress to 30 minutes of  aerobic without signs/symptoms of physical distress Progress to 30  minutes of  aerobic without signs/symptoms of physical distress      Intensity THRR unchanged THRR unchanged        Progression   Progression Continue to progress workloads to maintain intensity without signs/symptoms of physical distress. Continue to progress workloads to maintain intensity without signs/symptoms of physical distress.        Resistance Training   Training Prescription Yes Yes      Weight 1 2      Reps 10-15 10-15        Treadmill   MPH 1.3 1.4      Grade 0 0      Minutes 15 15      METs 1.9 2        NuStep   Level 2 2      SPM 11 15      Minutes 20 20      METs 3.43 3.45        Home Exercise Plan   Plans to continue exercise at Home (comment) Home (comment)      Frequency Add 2 additional days to program exercise sessions. Add 2 additional days to program exercise sessions.         Exercise Comments:      Exercise Comments    Row Name 09/24/16 1436 10/04/16 1538  Exercise Comments Patient has just started CR and will be progressed in time.  Patient is doing well in CR.          Exercise Goals and Review:      Exercise Goals    Row Name 09/18/16 1454             Exercise Goals   Increase Physical Activity Yes       Intervention Provide advice, education, support and counseling about physical activity/exercise needs.;Develop an individualized exercise prescription for aerobic and resistive training based on initial evaluation findings, risk stratification, comorbidities and participant's personal goals.       Expected Outcomes Achievement of increased cardiorespiratory fitness and enhanced flexibility, muscular endurance and strength shown through measurements of functional capacity and personal statement of participant.       Increase Strength and Stamina Yes       Intervention Provide advice, education, support and counseling about physical activity/exercise needs.;Develop an individualized exercise prescription for aerobic and  resistive training based on initial evaluation findings, risk stratification, comorbidities and participant's personal goals.       Expected Outcomes Achievement of increased cardiorespiratory fitness and enhanced flexibility, muscular endurance and strength shown through measurements of functional capacity and personal statement of participant.          Exercise Goals Re-Evaluation :     Exercise Goals Re-Evaluation    Row Name 10/10/16 1401             Exercise Goal Re-Evaluation   Exercise Goals Review Increase Physical Activity;Increase Strenth and Stamina       Comments Patient has completed 9 sessions with some progression. She says she feels stronger and is able to walk better since she started. Will continue to monitor.        Expected Outcomes Patient will complete the program with increased strength, stamina, and activity.           Discharge Exercise Prescription (Final Exercise Prescription Changes):     Exercise Prescription Changes - 10/04/16 1500      Response to Exercise   Blood Pressure (Admit) 120/66   Blood Pressure (Exercise) 136/68   Blood Pressure (Exit) 126/64   Heart Rate (Admit) 70 bpm   Heart Rate (Exercise) 76 bpm   Heart Rate (Exit) 76 bpm   Rating of Perceived Exertion (Exercise) 11   Duration Progress to 30 minutes of  aerobic without signs/symptoms of physical distress   Intensity THRR unchanged     Progression   Progression Continue to progress workloads to maintain intensity without signs/symptoms of physical distress.     Resistance Training   Training Prescription Yes   Weight 2   Reps 10-15     Treadmill   MPH 1.4   Grade 0   Minutes 15   METs 2     NuStep   Level 2   SPM 15   Minutes 20   METs 3.45     Home Exercise Plan   Plans to continue exercise at Home (comment)   Frequency Add 2 additional days to program exercise sessions.      Nutrition:  Target Goals: Understanding of nutrition guidelines, daily intake of  sodium <1562m, cholesterol <2014m calories 30% from fat and 7% or less from saturated fats, daily to have 5 or more servings of fruits and vegetables.  Biometrics:     Pre Biometrics - 09/18/16 1434      Pre Biometrics   Height 5' 2" (1.575  m)   Weight 148 lb 2.4 oz (67.2 kg)   Waist Circumference 36 inches   Hip Circumference 39 inches   Waist to Hip Ratio 0.92 %   BMI (Calculated) 27.2   Triceps Skinfold 15 mm   % Body Fat 36.7 %   Grip Strength 48.4 kg   Flexibility 14.75 in   Single Leg Stand 12.5 seconds       Nutrition Therapy Plan and Nutrition Goals:   Nutrition Discharge: Rate Your Plate Scores:     Nutrition Assessments - 09/18/16 1455      MEDFICTS Scores   Pre Score 45      Nutrition Goals Re-Evaluation:   Nutrition Goals Discharge (Final Nutrition Goals Re-Evaluation):   Psychosocial: Target Goals: Acknowledge presence or absence of significant depression and/or stress, maximize coping skills, provide positive support system. Participant is able to verbalize types and ability to use techniques and skills needed for reducing stress and depression.  Initial Review & Psychosocial Screening:     Initial Psych Review & Screening - 09/18/16 1457      Initial Review   Current issues with Current Depression     Family Dynamics   Good Support System? Yes     Barriers   Psychosocial barriers to participate in program The patient should benefit from training in stress management and relaxation.;Psychosocial barriers identified (see note)     Screening Interventions   Interventions Encouraged to exercise;Program counselor consult      Quality of Life Scores:     Quality of Life - 09/18/16 1435      Quality of Life Scores   Health/Function Pre 16.7 %   Socioeconomic Pre 10.43 %   Psych/Spiritual Pre 8.93 %   Family Pre 10.1 %   GLOBAL Pre 12.84 %      PHQ-9: Recent Review Flowsheet Data    Depression screen [facility] 2/9 09/18/2016    Decreased Interest 1   Down, Depressed, Hopeless 1   PHQ - 2 Score 2   Altered sleeping 1   Tired, decreased energy 1   Change in appetite 1   Feeling bad or failure about yourself  1   Trouble concentrating 1   Moving slowly or fidgety/restless 1   Suicidal thoughts 1   PHQ-9 Score 9   Difficult doing work/chores Very difficult     Interpretation of Total Score  Total Score Depression Severity:  1-4 = Minimal depression, 5-9 = Mild depression, 10-14 = Moderate depression, 15-19 = Moderately severe depression, 20-27 = Severe depression   Psychosocial Evaluation and Intervention:     Psychosocial Evaluation - 09/18/16 1458      Psychosocial Evaluation & Interventions   Interventions Relaxation education;Encouraged to exercise with the program and follow exercise prescription;Therapist referral   Comments She will consult her MD about a therapist referral   Continue Psychosocial Services  Follow up required by staff      Psychosocial Re-Evaluation:     Psychosocial Re-Evaluation    [place] Name 10/10/16 1402             Psychosocial Re-Evaluation   Current issues with Current Depression       Comments Patient says she has an appointment to see her pcp in July. She is going to talk to him then about counseling and other treatment options for her depression.       Expected Outcomes Patient's QOL and PHQ-9 scores will improve or remain the same at discharge.  Interventions Stress management education;Encouraged to attend Cardiac Rehabilitation for the exercise;Relaxation education       Continue Psychosocial Services  Follow up required by staff          Psychosocial Discharge (Final Psychosocial Re-Evaluation):     Psychosocial Re-Evaluation - 10/10/16 1402      Psychosocial Re-Evaluation   Current issues with Current Depression   Comments Patient says she has an appointment to see her pcp in July. She is going to talk to him then about counseling and other  treatment options for her depression.   Expected Outcomes Patient's QOL and PHQ-9 scores will improve or remain the same at discharge.    Interventions Stress management education;Encouraged to attend Cardiac Rehabilitation for the exercise;Relaxation education   Continue Psychosocial Services  Follow up required by staff      Vocational Rehabilitation: Provide vocational rehab assistance to qualifying candidates.   Vocational Rehab Evaluation & Intervention:     Vocational Rehab - 09/18/16 1453      Initial Vocational Rehab Evaluation & Intervention   Assessment shows need for Vocational Rehabilitation No      Education: Education Goals: Education classes will be provided on a weekly basis, covering required topics. Participant will state understanding/return demonstration of topics presented.  Learning Barriers/Preferences:     Learning Barriers/Preferences - 09/18/16 1452      Learning Barriers/Preferences   Learning Barriers None   Learning Preferences Group Instruction;Skilled Demonstration;Verbal Instruction;Video;Pictoral      Education Topics: Hypertension, Hypertension Reduction -Define heart disease and high blood pressure. Discus how high blood pressure affects the body and ways to reduce high blood pressure.   Exercise and Your Heart -Discuss why it is important to exercise, the FITT principles of exercise, normal and abnormal responses to exercise, and how to exercise safely.   Angina -Discuss definition of angina, causes of angina, treatment of angina, and how to decrease risk of having angina.   Cardiac Medications -Review what the following cardiac medications are used for, how they affect the body, and side effects that may occur when taking the medications.  Medications include Aspirin, Beta blockers, calcium channel blockers, ACE Inhibitors, angiotensin receptor blockers, diuretics, digoxin, and antihyperlipidemics.   Congestive Heart  Failure -Discuss the definition of CHF, how to live with CHF, the signs and symptoms of CHF, and how keep track of weight and sodium intake.   Heart Disease and Intimacy -Discus the effect sexual activity has on the heart, how changes occur during intimacy as we age, and safety during sexual activity.   CARDIAC REHAB PHASE II EXERCISE from 10/10/2016 in [place]  Date  09/26/16  Educator  DJ  Instruction Review Code  2- meets goals/outcomes      Smoking Cessation / COPD -Discuss different methods to quit smoking, the health benefits of quitting smoking, and the definition of COPD.   CARDIAC REHAB PHASE II EXERCISE from 10/10/2016 in [place]  Date  10/03/16  Educator  Russella Dar  Instruction Review Code  2- meets goals/outcomes      Nutrition I: Fats -Discuss the types of cholesterol, what cholesterol does to the heart, and how cholesterol levels can be controlled.   CARDIAC REHAB PHASE II EXERCISE from 10/10/2016 in [place]  Date  10/10/16  Educator  DC  Instruction Review Code  2- meets goals/outcomes      Nutrition II: Labels -Discuss the different components of food labels and how to read food  label   Heart Parts and Heart Disease -Discuss the anatomy of the heart, the pathway of blood circulation through the heart, and these are affected by heart disease.   Stress I: Signs and Symptoms -Discuss the causes of stress, how stress may lead to anxiety and depression, and ways to limit stress.   Stress II: Relaxation -Discuss different types of relaxation techniques to limit stress.   Warning Signs of Stroke / TIA -Discuss definition of a stroke, what the signs and symptoms are of a stroke, and how to identify when someone is having stroke.   Knowledge Questionnaire Score:     Knowledge Questionnaire Score - 09/18/16 1453      Knowledge Questionnaire Score   Pre Score 20/24      Core  Components/Risk Factors/Patient Goals at Admission:     Personal Goals and Risk Factors at Admission - 09/18/16 1455      Core Components/Risk Factors/Patient Goals on Admission    Weight Management Weight Maintenance   Improve shortness of breath with ADL's Yes   Intervention Provide education, individualized exercise plan and daily activity instruction to help decrease symptoms of SOB with activities of daily living.   Expected Outcomes Short Term: Achieves a reduction of symptoms when performing activities of daily living.   Personal Goal Other Yes   Personal Goal Keep exercising, Be able to get up and do things more regularily.   Intervention Attend CR 3xweek and supplement at home 2xweek.   Expected Outcomes Achieve personal goals      Core Components/Risk Factors/Patient Goals Review:      Goals and Risk Factor Review    Row Name 09/18/16 1457 10/10/16 1357           Core Components/Risk Factors/Patient Goals Review   Personal Goals Review Weight Management/Obesity;Improve shortness of breath with ADL's;Stress Weight Management/Obesity;Improve shortness of breath with ADL's  Be able to get up and do things; keep excercising.      Review  - Patient has completed 9 sessions gaining 7.2 lbs. Patient says she is walking better and feels more stable on her feet. She says she feels stronger. We contacted her cardiologist d/t at 10 lbs weight gain in 2 weeks with periorbital edema. She say PA today. Her Lasix dosage was adjusted and she was put on a low Na diet. She is to f/u in 2 months. Will continue to monitor.       Expected Outcomes  - Patient will complete the program meeting her personal goals.          Core Components/Risk Factors/Patient Goals at Discharge (Final Review):      Goals and Risk Factor Review - 10/10/16 1357      Core Components/Risk Factors/Patient Goals Review   Personal Goals Review Weight Management/Obesity;Improve shortness of breath with ADL's  Be  able to get up and do things; keep excercising.   Review Patient has completed 9 sessions gaining 7.2 lbs. Patient says she is walking better and feels more stable on her feet. She says she feels stronger. We contacted her cardiologist d/t at 10 lbs weight gain in 2 weeks with periorbital edema. She say PA today. Her Lasix dosage was adjusted and she was put on a low Na diet. She is to f/u in 2 months. Will continue to monitor.    Expected Outcomes Patient will complete the program meeting her personal goals.       ITP Comments:     ITP Comments  [place] Name 09/27/16 1244           ITP Comments Patient met with Registered Dietitian to discuss nutrition topics including: Heart healthty eating, heart healthy cooking and making smart choices when shopping; Portion control; weight management; and hydration. Patient attended a group session with the hospital chaplian called [facility] to discuss and share how this recent diagnosis has effected their life          Comments: ITP 30 Day REVIEW Patient doing well in the program. Will continue to monitor for progress.

## 2016-10-10 NOTE — Patient Instructions (Signed)
Medication Instructions:  STOP AMIODARONE TAKE LASIX 40 MG DAILY   Labwork: TODAY   Testing/Procedures: NONE  Follow-Up: Your physician recommends that you schedule a follow-up appointment in: 2 MONTHS    Any Other Special Instructions Will Be Listed Below (If Applicable). You have been referred to DR. HAWKINS, SOMEONE FORM HIS OFFICE WILL CONTACT YOU SOON.     If you need a refill on your cardiac medications before your next appointment, please call your pharmacy.

## 2016-10-10 NOTE — Progress Notes (Signed)
Daily Session Note  Patient Details  Name: [name] [name] MRN: [id number] Date of Birth: 11/27/1940 Referring Provider:     CARDIAC REHAB PHASE II ORIENTATION from 09/18/2016 in [place] C[facility]ITATION  Referring Provider  Dr. Branch      Encounter Date: 10/10/2016  Check In:     Session Check In - 10/10/16 0815      Check-In   Location [facility]   Staff Present Diane Coad, MS, EP, CHC, Exercise Physiologist;Gregory Cowan, BS, EP, Exercise Physiologist; , RN, BSN   Supervising physician immediately available to respond to emergencies See telemetry face sheet for immediately available MD   Medication changes reported     Yes   Comments Dr. Branch doubled her Lasix for 3 days starting Tuesday 10/09/16.    Fall or balance concerns reported    No   Tobacco Cessation No Change   Warm-up and Cool-down Performed as group-led instruction   Resistance Training Performed Yes   VAD Patient? No     Pain Assessment   Currently in Pain? No/denies   Pain Score 0-No pain   Multiple Pain Sites No      Capillary Blood Glucose: No results found for this or any previous visit (from the past 24 hour(s)).    History  Smoking Status  . Never Smoker  Smokeless Tobacco  . Never Used    Goals Met:  Independence with exercise equipment Exercise tolerated well No report of cardiac concerns or symptoms Strength training completed today  Goals Unmet:  Not Applicable  Comments: Dr. Branch doubled patient's Lasix dosage for 3 days starting Tuesday 10/09/16. Dr. Branch wanted her to be seen this week. We contacted his office to get her worked in today. She has an appointment at 11:00 today with PA Michelle Lenze. Patient in agreement. Check out 9:15.   Dr. Suresh Koneswaran is Medical Director for [place] [facility]. 

## 2016-10-12 ENCOUNTER — Encounter (HOSPITAL_COMMUNITY)
Admission: RE | Admit: 2016-10-12 | Discharge: 2016-10-12 | Disposition: A | Discharge status: Home / Self Care | Payer: Medicare Other | Source: Ambulatory Visit | Attending: Cardiology | Admitting: Cardiology

## 2016-10-12 ENCOUNTER — Telehealth: Payer: Self-pay | Admitting: *Deleted

## 2016-10-12 DIAGNOSIS — I5032 Chronic diastolic (congestive) heart failure: Secondary | ICD-10-CM | POA: Diagnosis not present

## 2016-10-12 DIAGNOSIS — Z9889 Other specified postprocedural states: Secondary | ICD-10-CM | POA: Diagnosis not present

## 2016-10-12 DIAGNOSIS — Z7901 Long term (current) use of anticoagulants: Secondary | ICD-10-CM | POA: Diagnosis not present

## 2016-10-12 DIAGNOSIS — Z86718 Personal history of other venous thrombosis and embolism: Secondary | ICD-10-CM | POA: Diagnosis not present

## 2016-10-12 DIAGNOSIS — K219 Gastro-esophageal reflux disease without esophagitis: Secondary | ICD-10-CM | POA: Diagnosis not present

## 2016-10-12 MED ORDER — METOPROLOL SUCCINATE ER 25 MG PO TB24: 25.0000 mg | ORAL_TABLET | Freq: Two times a day (BID) | ORAL | 3 refills | Status: DC

## 2016-10-12 NOTE — Progress Notes (Signed)
Daily Session Note  Patient Details  Name: [name] [name] MRN: [id number] Date of Birth: February 17, 1941 Referring Provider:     CARDIAC REHAB PHASE II ORIENTATION from 09/18/2016 in [place]  Referring Provider  Dr. [name]      Encounter Date: 10/12/2016  Check In:     Session Check In - 10/12/16 0820      Check-In   Location [facility]   Staff Present Aundra Dubin, RN, BSN;Hoby Kawai Luther Parody, BS, EP, Exercise Physiologist   Supervising physician immediately available to respond to emergencies See telemetry face sheet for immediately available MD   Medication changes reported     No   Fall or balance concerns reported    No   Warm-up and Cool-down Performed as group-led instruction   Resistance Training Performed Yes   VAD Patient? No     Pain Assessment   Currently in Pain? No/denies   Pain Score 0-No pain   Multiple Pain Sites No      Capillary Blood Glucose: No results found for this or any previous visit (from the past 24 hour(s)).    History  Smoking Status  . Never Smoker  Smokeless Tobacco  . Never Used    Goals Met:  Independence with exercise equipment Exercise tolerated well No report of cardiac concerns or symptoms Strength training completed today  Goals Unmet:  Not Applicable  Comments: Check out 915   Dr. Kate Sable is Medical Director for [place] and Pulmonary Rehab.

## 2016-10-12 NOTE — Telephone Encounter (Signed)
Received call from pharmacy requesting clarification on toprol xl dose. [facility] informed that our records show toprol xl 25 mg twice daily.

## 2016-10-15 ENCOUNTER — Encounter (HOSPITAL_COMMUNITY)
Admission: RE | Admit: 2016-10-15 | Discharge: 2016-10-15 | Disposition: A | Discharge status: Home / Self Care | Payer: Medicare Other | Source: Ambulatory Visit | Attending: Cardiology | Admitting: Cardiology

## 2016-10-15 ENCOUNTER — Telehealth: Payer: Self-pay

## 2016-10-15 DIAGNOSIS — I5032 Chronic diastolic (congestive) heart failure: Secondary | ICD-10-CM | POA: Diagnosis not present

## 2016-10-15 DIAGNOSIS — K219 Gastro-esophageal reflux disease without esophagitis: Secondary | ICD-10-CM | POA: Diagnosis not present

## 2016-10-15 DIAGNOSIS — Z7901 Long term (current) use of anticoagulants: Secondary | ICD-10-CM | POA: Diagnosis not present

## 2016-10-15 DIAGNOSIS — Z86718 Personal history of other venous thrombosis and embolism: Secondary | ICD-10-CM | POA: Diagnosis not present

## 2016-10-15 DIAGNOSIS — Z9889 Other specified postprocedural states: Secondary | ICD-10-CM | POA: Diagnosis not present

## 2016-10-15 NOTE — Telephone Encounter (Signed)
Spouse says pt went to cardiac rehab this am and he would leave her a note to call us.He is worried she could have asbestosis as she always washed his clothes over the years.i will touch base with her after rehab

## 2016-10-15 NOTE — Telephone Encounter (Signed)
-----   Message from Arnoldo Lenis, MD sent at 10/12/2016  3:19 PM EDT ----- Can we call this patient Monday to see how her swelling and breathing is doing?   [name] MD

## 2016-10-15 NOTE — Progress Notes (Signed)
Daily Session Note  Patient Details  Name: [name] [name] MRN: [id number] Date of Birth: 17-Mar-1941 Referring Provider:     CARDIAC REHAB PHASE II ORIENTATION from 09/18/2016 in [place]  Referring Provider  Dr. [name]      Encounter Date: 10/15/2016  Check In:     Session Check In - 10/15/16 0837      Check-In   Location [facility]   Staff Present Aundra Dubin, RN, BSN;Manly Nestle Luther Parody, BS, EP, Exercise Physiologist   Supervising physician immediately available to respond to emergencies See telemetry face sheet for immediately available MD   Medication changes reported     No   Fall or balance concerns reported    No   Warm-up and Cool-down Performed as group-led instruction   Resistance Training Performed Yes   VAD Patient? No     Pain Assessment   Currently in Pain? No/denies   Pain Score 0-No pain   Multiple Pain Sites No      Capillary Blood Glucose: No results found for this or any previous visit (from the past 24 hour(s)).    History  Smoking Status  . Never Smoker  Smokeless Tobacco  . Never Used    Goals Met:  Independence with exercise equipment Exercise tolerated well No report of cardiac concerns or symptoms Strength training completed today  Goals Unmet:  Not Applicable  Comments: Check out 915   Dr. Kate Sable is Medical Director for [place] and Pulmonary Rehab.

## 2016-10-16 ENCOUNTER — Ambulatory Visit (INDEPENDENT_AMBULATORY_CARE_PROVIDER_SITE_OTHER): Payer: Medicare Other | Admitting: *Deleted

## 2016-10-16 DIAGNOSIS — I481 Persistent atrial fibrillation: Secondary | ICD-10-CM | POA: Diagnosis not present

## 2016-10-16 DIAGNOSIS — Z9889 Other specified postprocedural states: Secondary | ICD-10-CM

## 2016-10-16 DIAGNOSIS — I4819 Other persistent atrial fibrillation: Secondary | ICD-10-CM

## 2016-10-16 DIAGNOSIS — Z5181 Encounter for therapeutic drug level monitoring: Secondary | ICD-10-CM

## 2016-10-16 LAB — POCT INR: INR: 3

## 2016-10-16 MED ORDER — WARFARIN SODIUM 2.5 MG PO TABS: ORAL_TABLET | ORAL | 3 refills | Status: DC

## 2016-10-17 ENCOUNTER — Encounter (HOSPITAL_COMMUNITY)
Admission: RE | Admit: 2016-10-17 | Discharge: 2016-10-17 | Disposition: A | Discharge status: Home / Self Care | Payer: Medicare Other | Source: Ambulatory Visit | Attending: Cardiology | Admitting: Cardiology

## 2016-10-17 DIAGNOSIS — Z9889 Other specified postprocedural states: Secondary | ICD-10-CM

## 2016-10-17 DIAGNOSIS — K219 Gastro-esophageal reflux disease without esophagitis: Secondary | ICD-10-CM | POA: Diagnosis not present

## 2016-10-17 DIAGNOSIS — Z7901 Long term (current) use of anticoagulants: Secondary | ICD-10-CM | POA: Diagnosis not present

## 2016-10-17 DIAGNOSIS — Z86718 Personal history of other venous thrombosis and embolism: Secondary | ICD-10-CM | POA: Diagnosis not present

## 2016-10-17 DIAGNOSIS — I5032 Chronic diastolic (congestive) heart failure: Secondary | ICD-10-CM | POA: Diagnosis not present

## 2016-10-17 NOTE — Progress Notes (Signed)
Daily Session Note  Patient Details  Name: [name] [name] MRN: [id number] Date of Birth: Jan 29, 1941 Referring Provider:     CARDIAC REHAB PHASE II ORIENTATION from 09/18/2016 in [place]  Referring Provider  Dr. [name]      Encounter Date: 10/17/2016  Check In:     Session Check In - 10/17/16 0828      Check-In   Location [facility]   Staff Present Aundra Dubin, RN, BSN;Janaiah Vetrano Luther Parody, BS, EP, Exercise Physiologist   Supervising physician immediately available to respond to emergencies See telemetry face sheet for immediately available MD   Medication changes reported     No   Fall or balance concerns reported    No   Warm-up and Cool-down Performed as group-led instruction   Resistance Training Performed Yes   VAD Patient? No     Pain Assessment   Currently in Pain? No/denies   Pain Score 0-No pain   Multiple Pain Sites No      Capillary Blood Glucose: Results for orders placed or performed in visit on 10/16/16 (from the past 24 hour(s))  POCT INR     Status: Normal   Collection Time: 10/16/16  8:57 AM  Result Value Ref Range   INR 3.0       History  Smoking Status  . Never Smoker  Smokeless Tobacco  . Never Used    Goals Met:  Independence with exercise equipment Exercise tolerated well No report of cardiac concerns or symptoms Strength training completed today  Goals Unmet:  Not Applicable  Comments: Check out 915   Dr. Kate Sable is Medical Director for [place] and Pulmonary Rehab.

## 2016-10-19 ENCOUNTER — Encounter (HOSPITAL_COMMUNITY)
Admission: RE | Admit: 2016-10-19 | Discharge: 2016-10-19 | Disposition: A | Discharge status: Home / Self Care | Payer: Medicare Other | Source: Ambulatory Visit | Attending: Cardiology | Admitting: Cardiology

## 2016-10-19 DIAGNOSIS — Z9889 Other specified postprocedural states: Secondary | ICD-10-CM | POA: Diagnosis not present

## 2016-10-19 DIAGNOSIS — Z86718 Personal history of other venous thrombosis and embolism: Secondary | ICD-10-CM | POA: Diagnosis not present

## 2016-10-19 DIAGNOSIS — K219 Gastro-esophageal reflux disease without esophagitis: Secondary | ICD-10-CM | POA: Diagnosis not present

## 2016-10-19 DIAGNOSIS — I5032 Chronic diastolic (congestive) heart failure: Secondary | ICD-10-CM | POA: Diagnosis not present

## 2016-10-19 DIAGNOSIS — Z7901 Long term (current) use of anticoagulants: Secondary | ICD-10-CM | POA: Diagnosis not present

## 2016-10-19 NOTE — Progress Notes (Signed)
Daily Session Note  Patient Details  Name: [name] [name] MRN: [id number] Date of Birth: 07-12-1940 Referring Provider:     CARDIAC REHAB PHASE II ORIENTATION from 09/18/2016 in [place]  Referring Provider  Dr. [name]      Encounter Date: 10/19/2016  Check In:     Session Check In - 10/19/16 0815      Check-In   Location [facility]   Staff Present Aundra Dubin, RN, BSN;Giorgia Wahler Luther Parody, BS, EP, Exercise Physiologist   Supervising physician immediately available to respond to emergencies See telemetry face sheet for immediately available MD   Medication changes reported     No   Fall or balance concerns reported    No   Warm-up and Cool-down Performed as group-led instruction   Resistance Training Performed Yes   VAD Patient? No     Pain Assessment   Currently in Pain? No/denies   Pain Score 0-No pain   Multiple Pain Sites No      Capillary Blood Glucose: No results found for this or any previous visit (from the past 24 hour(s)).    History  Smoking Status  . Never Smoker  Smokeless Tobacco  . Never Used    Goals Met:  Independence with exercise equipment Exercise tolerated well No report of cardiac concerns or symptoms Strength training completed today  Goals Unmet:  Not Applicable  Comments: Check out 915   Dr. Kate Sable is Medical Director for [place] and Pulmonary Rehab.

## 2016-10-22 ENCOUNTER — Encounter (HOSPITAL_COMMUNITY)
Admission: RE | Admit: 2016-10-22 | Discharge: 2016-10-22 | Disposition: A | Discharge status: Home / Self Care | Payer: Medicare Other | Source: Ambulatory Visit | Attending: Cardiology | Admitting: Cardiology

## 2016-10-22 DIAGNOSIS — I5032 Chronic diastolic (congestive) heart failure: Secondary | ICD-10-CM | POA: Diagnosis not present

## 2016-10-22 DIAGNOSIS — Z7901 Long term (current) use of anticoagulants: Secondary | ICD-10-CM | POA: Diagnosis not present

## 2016-10-22 DIAGNOSIS — Z9889 Other specified postprocedural states: Secondary | ICD-10-CM | POA: Diagnosis not present

## 2016-10-22 DIAGNOSIS — Z86718 Personal history of other venous thrombosis and embolism: Secondary | ICD-10-CM | POA: Diagnosis not present

## 2016-10-22 DIAGNOSIS — K219 Gastro-esophageal reflux disease without esophagitis: Secondary | ICD-10-CM | POA: Diagnosis not present

## 2016-10-22 NOTE — Progress Notes (Addendum)
Daily Session Note  Patient Details  Name: [name] [name] MRN: [id number] Date of Birth: 28-Jan-1941 Referring Provider:     CARDIAC REHAB PHASE II ORIENTATION from 09/18/2016 in [place]  Referring Provider  Dr. [name]      Encounter Date: 10/22/2016  Check In:     Session Check In - 10/22/16 0815      Check-In   Location [facility]   Staff Present Russella Dar, MS, EP, [facility], Exercise Physiologist;Gregory Luther Parody, BS, EP, Exercise Physiologist   Supervising physician immediately available to respond to emergencies See telemetry face sheet for immediately available MD   Medication changes reported     No   Fall or balance concerns reported    No   Tobacco Cessation No Change   Warm-up and Cool-down Performed as group-led instruction   Resistance Training Performed Yes   VAD Patient? No     Pain Assessment   Currently in Pain? No/denies   Pain Score 0-No pain   Multiple Pain Sites No      Capillary Blood Glucose: No results found for this or any previous visit (from the past 24 hour(s)).    History  Smoking Status  . Never Smoker  Smokeless Tobacco  . Never Used    Goals Met:  Independence with exercise equipment Exercise tolerated well No report of cardiac concerns or symptoms Strength training completed today  Goals Unmet:  Not Applicable  Comments: Check out: 9:15 Patient is wheezing. She informed us that she has a doctors appointment on July l5, 2018 with Dr. [name].    Dr. Kate Sable is Medical Director for [facility] Card[facility] and Pulmonary Rehab.

## 2016-10-24 ENCOUNTER — Encounter (HOSPITAL_COMMUNITY): Payer: Medicare Other

## 2016-10-25 ENCOUNTER — Encounter: Payer: Self-pay | Admitting: Cardiology

## 2016-10-25 ENCOUNTER — Ambulatory Visit (INDEPENDENT_AMBULATORY_CARE_PROVIDER_SITE_OTHER): Payer: Medicare Other | Admitting: Cardiology

## 2016-10-25 ENCOUNTER — Telehealth: Payer: Self-pay

## 2016-10-25 VITALS — BP 138/76 | HR 67 | Ht 62.0 in | Wt 159.0 lb

## 2016-10-25 DIAGNOSIS — F411 Generalized anxiety disorder: Secondary | ICD-10-CM | POA: Diagnosis not present

## 2016-10-25 DIAGNOSIS — I499 Cardiac arrhythmia, unspecified: Secondary | ICD-10-CM | POA: Diagnosis not present

## 2016-10-25 DIAGNOSIS — E785 Hyperlipidemia, unspecified: Secondary | ICD-10-CM | POA: Diagnosis not present

## 2016-10-25 DIAGNOSIS — J449 Chronic obstructive pulmonary disease, unspecified: Secondary | ICD-10-CM

## 2016-10-25 DIAGNOSIS — Z9889 Other specified postprocedural states: Secondary | ICD-10-CM

## 2016-10-25 DIAGNOSIS — R0602 Shortness of breath: Secondary | ICD-10-CM | POA: Diagnosis not present

## 2016-10-25 DIAGNOSIS — Z0000 Encounter for general adult medical examination without abnormal findings: Secondary | ICD-10-CM | POA: Diagnosis not present

## 2016-10-25 DIAGNOSIS — Z6823 Body mass index (BMI) 23.0-23.9, adult: Secondary | ICD-10-CM | POA: Diagnosis not present

## 2016-10-25 DIAGNOSIS — Z7189 Other specified counseling: Secondary | ICD-10-CM | POA: Diagnosis not present

## 2016-10-25 DIAGNOSIS — Z1211 Encounter for screening for malignant neoplasm of colon: Secondary | ICD-10-CM | POA: Diagnosis not present

## 2016-10-25 DIAGNOSIS — I340 Nonrheumatic mitral (valve) insufficiency: Secondary | ICD-10-CM | POA: Diagnosis not present

## 2016-10-25 DIAGNOSIS — I4891 Unspecified atrial fibrillation: Secondary | ICD-10-CM

## 2016-10-25 DIAGNOSIS — Z299 Encounter for prophylactic measures, unspecified: Secondary | ICD-10-CM | POA: Diagnosis not present

## 2016-10-25 DIAGNOSIS — Z1389 Encounter for screening for other disorder: Secondary | ICD-10-CM | POA: Diagnosis not present

## 2016-10-25 DIAGNOSIS — I5033 Acute on chronic diastolic (congestive) heart failure: Secondary | ICD-10-CM | POA: Diagnosis not present

## 2016-10-25 MED ORDER — FUROSEMIDE 40 MG PO TABS: 40.0000 mg | ORAL_TABLET | Freq: Two times a day (BID) | ORAL | 1 refills | Status: DC

## 2016-10-25 NOTE — Telephone Encounter (Signed)
Pt has laryngitis today,did not have labs drawn from [facility]  Visit, will see Dr [name] today

## 2016-10-25 NOTE — Patient Instructions (Signed)
Medication Instructions:  INCREASE LASIX TO 40 MG TWO TIMES DAILY   Labwork: 1 WEEK  CMET TSH MAGNESIUM   Testing/Procedures: Your physician has requested that you have an echocardiogram. Echocardiography is a painless test that uses sound waves to create images of your heart. It provides your doctor with information about the size and shape of your heart and how well your heart's chambers and valves are working. This procedure takes approximately one hour. There are no restrictions for this procedure.    Follow-Up: Your physician recommends that you schedule a follow-up appointment in: 2 WEEKS    Any Other Special Instructions Will Be Listed Below (If Applicable).     If you need a refill on your cardiac medications before your next appointment, please call your pharmacy.

## 2016-10-25 NOTE — Telephone Encounter (Signed)
-----   Message from Arnoldo Lenis, MD sent at 10/12/2016  3:19 PM EDT ----- Can we call this patient Monday to see how her swelling and breathing is doing?   [name] MD

## 2016-10-25 NOTE — Progress Notes (Signed)
Clinical Summary [name] [name] is a 76 y.o.female seen today for follow up of the following medical problems.     1. Mitral regurgitation - echo at last [facility] Jan 2017 showed normal LVEF, 3+(modarte)eccentric MR with immobile posterior leaflet.  - repeat echo 01/2016 moderate to severe MR. LVEF 60-65%. LVIDs 31. Symptoms unchanged since last visit.  - 01/2016 TEE moderate to severe eccentric MR, severe TR.  - she is s/p mitral valve repair 07/05/16, Sorin Memo 3D Ring Annuloplasty (size 35mm, catalog #[id number], serial [id number])    - seen 10/10/16 by PA Lenze for increased leg swelling and SOB. From that visit appeared patient had some recent very high sodium intake. Weight reportedly increased from 150 to 160. Lasix was increased to 40mg  bid x 3 days, weight started to trend down - increased edema. Has had some intermittent SOB, wheezing. No orthopnea - taking 40mg  daily. -has labs with pcp today.   2.Paroxysmal afib - occasional palpiations, about once month. Lasts for a few minutes - s/p MAZE procedure during recent MV repair - no recent palpitations. Heart rates at home around 110s.   - denies any palpitations.  3. COPD - referred to Dr Luan Pulling   4. HTN - compliant with meds Past Medical History:  Diagnosis Date  . Anxiety   . Arthritis   . Asthma   . Atrial fibrillation, persistent ([place])   . Chronic diastolic congestive heart failure ([place])   . Colon adenoma   . Colon cancer ([place])    status post low anterior resection, limited stage disease requiring no adjuvant therapy  . COPD (chronic obstructive pulmonary disease) ([place])   . Depression   . Diverticulosis   . DM (dermatomyositis)   . DVT (deep venous thrombosis) ([name])    in leg- long time ago  . Dyspnea    with activity  . Dysrhythmia   . Esophageal dysphagia   . GERD (gastroesophageal reflux disease)   . Headache   . Heart murmur   . Hematuria   . Hemorrhoids   . Hiatal hernia   . History of  kidney stones    x 2  . Hypercholesterolemia   . Incidental pulmonary nodule 05/08/2016   8 mm vague opacity RML noted on CT scan  . Mitral regurgitation   . PONV (postoperative nausea and vomiting) 2003 ish    with breast biopsy  . S/P minimally invasive maze operation for atrial fibrillation 07/05/2016   Complete bilateral atrial lesion set using cryothermy and bipolar radiofrequency ablation with clipping of LA appendage via right mini thoracotomy approach  . 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 Schatzki's ring   . Tricuspid regurgitation      Allergies  Allergen Reactions  . Iohexol Hives and Shortness Of Breath     Pt. had a severe allergic reaction to IV contrast the last time she was injected and had to be seen in the ER.   Marland Kitchen Hydrocodone-Acetaminophen Hives  . Nabumetone Rash  . Prednisone Rash     Current Outpatient Prescriptions  Medication Sig Dispense Refill  . acetaminophen (TYLENOL) 325 MG tablet Take 650 mg by mouth every 6 (six) hours as needed for pain.    Marland Kitchen albuterol (PROVENTIL HFA;VENTOLIN HFA) 108 (90 BASE) MCG/ACT inhaler Inhale 2 puffs into the lungs every 6 (six) hours as needed  for wheezing.    Marland Kitchen ALPRAZolam (XANAX) 0.5 MG tablet Take 0.5 mg by mouth 2 (two) times daily as needed (for anxiety/sleep (scheduled at bedtime)).     Marland Kitchen aspirin EC 81 MG EC tablet Take 1 tablet (81 mg total) by mouth daily.    . calcium carbonate (OS-CAL) 600 MG TABS tablet Take 600 mg by mouth 2 (two) times daily with a meal.    . Coenzyme Q10 100 MG TABS Take 100 mg by mouth every evening.     . docusate sodium (COLACE) 100 MG capsule Take 200 mg by mouth daily as needed for mild constipation.     . furosemide (LASIX) 40 MG tablet Take 1 tablet (40 mg total) by mouth daily. 30 tablet 1  . hydrocortisone  (ANUSOL-HC) 2.5 % rectal cream Place 1 application rectally 2 (two) times daily. FOR UP TO 10 DAYS AT A TIME (Patient taking differently: Place 1 application rectally 2 (two) times daily as needed for hemorrhoids or itching. FOR UP TO 10 DAYS AT A TIME) 30 g 1  . metoprolol succinate (TOPROL-XL) 25 MG 24 hr tablet Take 1 tablet (25 mg total) by mouth 2 (two) times daily. 60 tablet 3  . Multiple Vitamin (MULTIVITAMIN) tablet Take 1 tablet by mouth daily.      Marland Kitchen omeprazole (PRILOSEC) 20 MG capsule Take 1 capsule (20 mg total) by mouth daily. 30 capsule 3  . PARoxetine (PAXIL) 20 MG tablet Take 20 mg by mouth daily.     Vladimir Faster Glycol-Propyl Glycol (SYSTANE ULTRA) 0.4-0.3 % SOLN Place 1 drop into both eyes 3 (three) times daily.    . potassium chloride SA (K-DUR,KLOR-CON) 20 MEQ tablet Take 1 tablet (20 mEq total) by mouth daily. 30 tablet 1  . sodium chloride (OCEAN) 0.65 % SOLN nasal spray Place 1 spray into both nostrils as needed for congestion.    . traMADol (ULTRAM) 50 MG tablet Take 50 mg by mouth every 4-6 hours PRN severe pain. 30 tablet 0  . umeclidinium-vilanterol (ANORO ELLIPTA) 62.5-25 MCG/INH AEPB Inhale 1 puff into the lungs daily. 30 each 6  . warfarin (COUMADIN) 2.5 MG tablet Take 1 tablet daily except 1 1/2 tablets on Sundays, Tuesdays and Thursdays 36 tablet 3   No current facility-administered medications for this visit.      Past Surgical History:  Procedure Laterality Date  . APPENDECTOMY    . BREAST SURGERY Right 2003ish   biopsy  . CARDIAC CATHETERIZATION N/A 04/12/2016   Procedure: Right/Left Heart Cath and Coronary Angiography;  Surgeon: Leonie Man, MD;  Location: [facility];  Service: Cardiovascular;  Laterality: N/A;  . CATARACT EXTRACTION Bilateral 2017  . CLIPPING OF ATRIAL APPENDAGE  07/05/2016   Procedure: CLIPPING OF ATRIAL APPENDAGE;  Surgeon: Rexene Alberts, MD;  Location: [facility];  Service: Open Heart Surgery;;  . COLONOSCOPY  11/09   Dr.  Gala Romney- external hemorrhoidal tags o/w normal rectal mucosa, s/p surgical resection with a normal appearing anastomosis 12cm, pan colonic diverticulum  . COLONOSCOPY N/A 06/02/2012   LPF:XTKWIO post low anterior resection. Pancolonic diverticulosis. Colonic polyp-tubular adenoma. Surveillance due 2019.   Marland Kitchen COLONOSCOPY N/A 10/13/2015   Procedure: COLONOSCOPY;  Surgeon: Daneil Dolin, MD;  [name];  Service: Endoscopy;  Laterality: N/A;  0930  . ESOPHAGOGASTRODUODENOSCOPY  07/2007   Dr. Daiva Nakayama cervical esophageal web, schatzki ring, large hiatal hernia  . INGUNAL HERNIA REPAIR Left   . LOW ANTERIOR BOWEL RESECTION  NO ADJ CHEMO  . MINIMALLY INVASIVE MAZE PROCEDURE N/A 07/05/2016   Procedure: MINIMALLY INVASIVE MAZE PROCEDURE;  Surgeon: Rexene Alberts, MD;  Location: [facility];  Service: Open Heart Surgery;  Laterality: N/A;  . MITRAL VALVE REPAIR Right 07/05/2016   Procedure: MINIMALLY INVASIVE MITRAL VALVE REPAIR (MVR);  Surgeon: Rexene Alberts, MD;  Location: [facility];  Service: Open Heart Surgery;  Laterality: Right;  . MULTIPLE EXTRACTIONS WITH ALVEOLOPLASTY N/A 05/21/2016   Procedure: MULTIPLE EXTRACTION OF TOOTH #'S 5, 21 WITH ALVEOLOPLASTY AND GROSS DEBRIDEMENT OF TEETH;  Surgeon: Lenn Cal, DDS;  Location: [facility];  Service: Oral Surgery;  Laterality: N/A;  . TEE WITHOUT CARDIOVERSION N/A 02/20/2016   Procedure: TRANSESOPHAGEAL ECHOCARDIOGRAM (TEE);  Surgeon: Arnoldo Lenis, MD;  [name];  Service: Endoscopy;  Laterality: N/A;  . TEE WITHOUT CARDIOVERSION N/A 07/05/2016   Procedure: TRANSESOPHAGEAL ECHOCARDIOGRAM (TEE);  Surgeon: Rexene Alberts, MD;  Location: [place];  Service: Open Heart Surgery;  Laterality: N/A;  . TOOTH EXTRACTION    . TUBAL LIGATION    . VEIN LIGATION AND STRIPPING       Allergies  Allergen Reactions  . Iohexol Hives and Shortness Of Breath     Pt. had a severe allergic reaction to IV contrast the last time she was  injected and had to be seen in the ER.   Marland Kitchen Hydrocodone-Acetaminophen Hives  . Nabumetone Rash  . Prednisone Rash      Family History  Problem Relation Age of Onset  . Breast cancer Mother   . Rheum arthritis Father   . Cancer - Lung Sister   . Brain cancer Sister   . Prostate cancer Brother   . Aneurysm Brother   . Colon cancer Neg Hx      Social History Ms. Shuping reports that she has never smoked. She has never used smokeless tobacco. Ms. Talwar reports that she does not drink alcohol.   Review of Systems CONSTITUTIONAL: No weight loss, fever, chills, weakness or fatigue.  HEENT: Eyes: No visual loss, blurred vision, double vision or yellow sclerae.No hearing loss, sneezing, congestion, runny nose or sore throat.  SKIN: No rash or itching.  CARDIOVASCULAR: per hpi RESPIRATORY: per hpi GASTROINTESTINAL: No anorexia, nausea, vomiting or diarrhea. No abdominal pain or blood.  GENITOURINARY: No burning on urination, no polyuria NEUROLOGICAL: No headache, dizziness, syncope, paralysis, ataxia, numbness or tingling in the extremities. No change in bowel or bladder control.  MUSCULOSKELETAL: No muscle, back pain, joint pain or stiffness.  LYMPHATICS: No enlarged nodes. No history of splenectomy.  PSYCHIATRIC: No history of depression or anxiety.  ENDOCRINOLOGIC: No reports of sweating, cold or heat intolerance. No polyuria or polydipsia.  Marland Kitchen   Physical Examination Vitals:   10/25/16 0940  BP: 138/76  Pulse: 67   Vitals:   10/25/16 0940  Weight: 159 lb (72.1 kg)  Height: 5\' 2"  (1.575 m)    Gen: resting comfortably, no acute distress HEENT: no scleral icterus, pupils equal round and reactive, no palptable cervical adenopathy,  CV: RRR, no m/r/g, elevated JVD Resp: Clear to auscultation bilaterally GI: abdomen is soft, non-tender, non-distended, normal bowel sounds, no hepatosplenomegaly MSK: extremities are warm, no edema.  Skin: warm, no rash Neuro:  no  focal deficits Psych: appropriate affect   Diagnostic Studies 01/2016 echo Study Conclusions  - Left ventricle: The cavity size was normal. Wall thickness was normal. Systolic function was normal. The estimated ejection fraction was in the range of  60% to 65%. Wall motion was normal; there were no regional wall motion abnormalities. The study is not technically sufficient to allow evaluation of LV diastolic function. Doppler parameters are consistent with high ventricular filling pressure. - Mitral valve: Calcified annulus. There was moderate to severe (closer to severe) eccentric regurgitation. - Left atrium: The atrium was severely dilated. - Right atrium: The atrium was moderately dilated. - Tricuspid valve: There was moderate-severe regurgitation. - Pulmonary arteries: PA peak pressure: 40 mm Hg (S).   01/2016 TEE eccentric moderate to severe MR. Moderate to severe TR. F/u full report for final findings.  08/22/16 echo  Study Conclusions  - Left ventricle: The cavity size was normal. Wall thickness was normal. Systolic function was normal. The estimated ejection fraction was in the range of 60% to 65%. Wall motion was normal; there were no regional wall motion abnormalities. The study is not technically sufficient to allow evaluation of LV diastolic function. - Aortic valve: Mildly calcified annulus. Trileaflet. Valve area (VTI): 1.6 cm^2. Valve area (Vmax): 1.59 cm^2. Valve area (Vmean): 1.61 cm^2. - Mitral valve: Status post complex valvuloplasty including artificial Gore-tex neochord placement and Sorin Memo 3D Ring Annuloplasty (size 65mm, catalog #[id number], serial [id number]). Mildly thickened leaflets. There was trivial regurgitation. Mean gradient (D): 4 mm Hg. - Left atrium: The atrium was mildly to moderately dilated. - Right ventricle: The cavity size was mildly dilated. - Right atrium: The atrium was at the upper limits of  normal in size. Central venous pressure (est): 3 mm Hg. - Atrial septum: No defect or patent foramen ovale was identified. - Tricuspid valve: There was moderate regurgitation. - Pulmonary arteries: Systolic pressure was moderately increased. PA peak pressure: 50 mm Hg (S). - Pericardium, extracardiac: There was no pericardial effusion.  Impressions:  - Normal LV wall thickness with LVEF 60-65%. Indeterminate diastolic function. Mild to moderate left atrial enlargement. Status post mitral valve repair and annuloplasty as indicated with mildly thickened leaflets and trivial mitral regurgitation. Mean gradient 4 mmHg. Mild right ventricular enlargement. At least moderate tricuspid regurgitation noted. Moderate pulmonary hypertension with PASP 50 mmHg.    Assessment and Plan  1. Mitral regurgitation/Mitral valve repair - s/p mitral valve repair - with recent issues with weight gain and edema we will repeat echo  2. PAF - no recent symptoms,  - continue current meds including coumadin   3. COPD - follow with Dr Luan Pulling  4. Acute on chronic diastolic HF - recurrent weight gain and SOB, weight up nearly 9 lbs - we will increase lasix to 40mg  bid. CHeck BMET/Mg/TSH in 1 week - repeat echo   F/u 2 weeks.       Arnoldo Lenis, M.D.

## 2016-10-26 ENCOUNTER — Encounter (HOSPITAL_COMMUNITY)
Admission: RE | Admit: 2016-10-26 | Discharge: 2016-10-26 | Disposition: A | Discharge status: Home / Self Care | Payer: Medicare Other | Source: Ambulatory Visit | Attending: Cardiology | Admitting: Cardiology

## 2016-10-26 ENCOUNTER — Ambulatory Visit (HOSPITAL_COMMUNITY)
Admission: RE | Admit: 2016-10-26 | Discharge: 2016-10-26 | Disposition: A | Discharge status: Home / Self Care | Payer: Medicare Other | Source: Ambulatory Visit | Attending: Cardiology | Admitting: Cardiology

## 2016-10-26 DIAGNOSIS — R0602 Shortness of breath: Secondary | ICD-10-CM | POA: Insufficient documentation

## 2016-10-26 DIAGNOSIS — E119 Type 2 diabetes mellitus without complications: Secondary | ICD-10-CM | POA: Insufficient documentation

## 2016-10-26 DIAGNOSIS — D649 Anemia, unspecified: Secondary | ICD-10-CM | POA: Diagnosis not present

## 2016-10-26 DIAGNOSIS — I071 Rheumatic tricuspid insufficiency: Secondary | ICD-10-CM | POA: Diagnosis not present

## 2016-10-26 DIAGNOSIS — Z79899 Other long term (current) drug therapy: Secondary | ICD-10-CM | POA: Diagnosis not present

## 2016-10-26 DIAGNOSIS — J449 Chronic obstructive pulmonary disease, unspecified: Secondary | ICD-10-CM | POA: Diagnosis not present

## 2016-10-26 DIAGNOSIS — Z9889 Other specified postprocedural states: Secondary | ICD-10-CM

## 2016-10-26 DIAGNOSIS — Z86718 Personal history of other venous thrombosis and embolism: Secondary | ICD-10-CM | POA: Diagnosis not present

## 2016-10-26 DIAGNOSIS — F411 Generalized anxiety disorder: Secondary | ICD-10-CM | POA: Diagnosis not present

## 2016-10-26 DIAGNOSIS — K219 Gastro-esophageal reflux disease without esophagitis: Secondary | ICD-10-CM | POA: Diagnosis not present

## 2016-10-26 DIAGNOSIS — I4891 Unspecified atrial fibrillation: Secondary | ICD-10-CM | POA: Diagnosis not present

## 2016-10-26 DIAGNOSIS — E785 Hyperlipidemia, unspecified: Secondary | ICD-10-CM | POA: Diagnosis not present

## 2016-10-26 DIAGNOSIS — I5032 Chronic diastolic (congestive) heart failure: Secondary | ICD-10-CM | POA: Diagnosis not present

## 2016-10-26 DIAGNOSIS — Z7901 Long term (current) use of anticoagulants: Secondary | ICD-10-CM | POA: Diagnosis not present

## 2016-10-26 NOTE — [place] Notes (Signed)
*  PRELIMINARY RESULTS* Echocardiogram 2D Echocardiogram has been performed.  Leavy Cella 10/26/2016, 10:37 AM

## 2016-10-26 NOTE — Progress Notes (Signed)
Daily Session Note  Patient Details  Name: [name] [name] MRN: [id number] Date of Birth: [birth year] Referring Provider:     CARDIAC REHAB PHASE II ORIENTATION from 09/18/2016 in [place]  Referring Provider  Dr. [name]      Encounter Date: 10/26/2016  Check In:     Session Check In - 10/26/16 0807      Check-In   Location [facility]   Staff Present Aundra Dubin, RN, BSN;Tawnia Schirm Luther Parody, BS, EP, Exercise Physiologist   Supervising physician immediately available to respond to emergencies See telemetry face sheet for immediately available MD   Medication changes reported     No   Fall or balance concerns reported    No   Warm-up and Cool-down Performed as group-led instruction   Resistance Training Performed Yes   VAD Patient? No     Pain Assessment   Currently in Pain? No/denies   Pain Score 0-No pain   Multiple Pain Sites No      Capillary Blood Glucose: No results found for this or any previous visit (from the past 24 hour(s)).    History  Smoking Status  . Never Smoker  Smokeless Tobacco  . Never Used    Goals Met:  Independence with exercise equipment Exercise tolerated well No report of cardiac concerns or symptoms Strength training completed today  Goals Unmet:  Not Applicable  Comments: Check out 915   Dr. Kate Sable is Medical Director for [place] [facility].

## 2016-10-29 ENCOUNTER — Encounter (HOSPITAL_COMMUNITY)
Admission: RE | Admit: 2016-10-29 | Discharge: 2016-10-29 | Disposition: A | Discharge status: Home / Self Care | Payer: Medicare Other | Source: Ambulatory Visit | Attending: Cardiology | Admitting: Cardiology

## 2016-10-29 DIAGNOSIS — Z9889 Other specified postprocedural states: Secondary | ICD-10-CM

## 2016-10-29 DIAGNOSIS — I5032 Chronic diastolic (congestive) heart failure: Secondary | ICD-10-CM | POA: Diagnosis not present

## 2016-10-29 DIAGNOSIS — Z7901 Long term (current) use of anticoagulants: Secondary | ICD-10-CM | POA: Diagnosis not present

## 2016-10-29 DIAGNOSIS — Z86718 Personal history of other venous thrombosis and embolism: Secondary | ICD-10-CM | POA: Diagnosis not present

## 2016-10-29 DIAGNOSIS — K219 Gastro-esophageal reflux disease without esophagitis: Secondary | ICD-10-CM | POA: Diagnosis not present

## 2016-10-29 NOTE — Progress Notes (Signed)
Cardiac Individual Treatment Plan  Patient Details  Name: [name] [name] MRN: [id number] Date of Birth: 26-May-1940 Referring Provider:     [place] from 09/18/2016 in [facility]  Referring Provider  Dr. [name]      Initial Encounter Date:    CARDIAC REHAB PHASE II ORIENTATION from 09/18/2016 in [facility]  Date  09/18/16  Referring Provider  Dr. [name]      Visit Diagnosis: S/P mitral valve repair  Patient's Home Medications on Admission:  Current Outpatient Prescriptions:  .  acetaminophen (TYLENOL) 325 MG tablet, Take 650 mg by mouth every 6 (six) hours as needed for pain., Disp: , Rfl:  .  albuterol (PROVENTIL HFA;VENTOLIN HFA) 108 (90 BASE) MCG/ACT inhaler, Inhale 2 puffs into the lungs every 6 (six) hours as needed for wheezing., Disp: , Rfl:  .  ALPRAZolam (XANAX) 0.5 MG tablet, Take 0.5 mg by mouth 2 (two) times daily as needed (for anxiety/sleep (scheduled at bedtime)). , Disp: , Rfl:  .  aspirin EC 81 MG EC tablet, Take 1 tablet (81 mg total) by mouth daily., Disp: , Rfl:  .  calcium carbonate (OS-CAL) 600 MG TABS tablet, Take 600 mg by mouth 2 (two) times daily with a meal., Disp: , Rfl:  .  Coenzyme Q10 100 MG TABS, Take 100 mg by mouth every evening. , Disp: , Rfl:  .  docusate sodium (COLACE) 100 MG capsule, Take 200 mg by mouth daily as needed for mild constipation. , Disp: , Rfl:  .  furosemide (LASIX) 40 MG tablet, Take 1 tablet (40 mg total) by mouth 2 (two) times daily., Disp: 30 tablet, Rfl: 1 .  hydrocortisone (ANUSOL-HC) 2.5 % rectal cream, Place 1 application rectally 2 (two) times daily. FOR UP TO 10 DAYS AT A TIME (Patient taking differently: Place 1 application rectally 2 (two) times daily as needed for hemorrhoids or itching. FOR UP TO 10 DAYS AT A TIME), Disp: 30 g, Rfl: 1 .  metoprolol succinate (TOPROL-XL) 25 MG 24 hr tablet, Take 1 tablet (25 mg total) by mouth 2 (two) times daily.,  Disp: 60 tablet, Rfl: 3 .  Multiple Vitamin (MULTIVITAMIN) tablet, Take 1 tablet by mouth daily.  , Disp: , Rfl:  .  omeprazole (PRILOSEC) 20 MG capsule, Take 1 capsule (20 mg total) by mouth daily., Disp: 30 capsule, Rfl: 3 .  PARoxetine (PAXIL) 20 MG tablet, Take 20 mg by mouth daily. , Disp: , Rfl:  .  Polyethyl Glycol-Propyl Glycol (SYSTANE ULTRA) 0.4-0.3 % SOLN, Place 1 drop into both eyes 3 (three) times daily., Disp: , Rfl:  .  potassium chloride (KLOR-CON) 20 MEQ packet, Take by mouth 2 (two) times daily., Disp: , Rfl:  .  potassium chloride SA (K-DUR,KLOR-CON) 20 MEQ tablet, Take 20 mEq by mouth 2 (two) times daily., Disp: , Rfl:  .  sodium chloride (OCEAN) 0.65 % SOLN nasal spray, Place 1 spray into both nostrils as needed for congestion., Disp: , Rfl:  .  traMADol (ULTRAM) 50 MG tablet, Take 50 mg by mouth every 4-6 hours PRN severe pain., Disp: 30 tablet, Rfl: 0 .  umeclidinium-vilanterol (ANORO ELLIPTA) 62.5-25 MCG/INH AEPB, Inhale 1 puff into the lungs daily., Disp: 30 each, Rfl: 6 .  warfarin (COUMADIN) 2.5 MG tablet, Take 1 tablet daily except 1 1/2 tablets on Sundays, Tuesdays and Thursdays, Disp: 36 tablet, Rfl: 3  Past Medical History: Past Medical History:  Diagnosis Date  . Anxiety   .  Arthritis   . Asthma   . Atrial fibrillation, persistent ([place])   . Chronic diastolic congestive heart failure ([place])   . Colon adenoma   . Colon cancer ([place])    status post low anterior resection, limited stage disease requiring no adjuvant therapy  . COPD (chronic obstructive pulmonary disease) ([place])   . Depression   . Diverticulosis   . DM (dermatomyositis)   . DVT (deep venous thrombosis) ([name])    in leg- long time ago  . Dyspnea    with activity  . Dysrhythmia   . Esophageal dysphagia   . GERD (gastroesophageal reflux disease)   . Headache   . Heart murmur   . Hematuria   . Hemorrhoids   . Hiatal hernia   . History of kidney stones    x 2  . Hypercholesterolemia   .  Incidental pulmonary nodule 05/08/2016   8 mm vague opacity RML noted on CT scan  . Mitral regurgitation   . PONV (postoperative nausea and vomiting) 2003 ish    with breast biopsy  . S/P minimally invasive maze operation for atrial fibrillation 07/05/2016   Complete bilateral atrial lesion set using cryothermy and bipolar radiofrequency ablation with clipping of LA appendage via right mini thoracotomy approach  . 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 Schatzki's ring   . Tricuspid regurgitation     Tobacco Use: History  Smoking Status  . Never Smoker  Smokeless Tobacco  . Never Used    Labs: Recent Review Flowsheet Data    Labs for ITP Cardiac and Pulmonary Rehab Latest Ref Rng & Units 07/05/2016 07/06/2016 07/06/2016 07/06/2016 07/06/2016   Hemoglobin A1c 4.8 - 5.6 % - - - - -   PHART 7.350 - 7.450 7.278(L) 7.288(L) 7.246(L) 7.330(L) -   PCO2ART 32.0 - 48.0 mmHg 54.1(H) 55.1(H) 63.4(H) 46.7 -   HCO3 20.0 - 28.0 mmol/L 25.4 26.4 26.1 24.9 -   TCO2 0 - 100 mmol/L _0 ACIDBASEDEF 0.0 - 2.0 mmol/L 2.0 1.0 - 1.0 -   O2SAT % 96.0 98.0 98.0 97.0 -      Capillary Blood Glucose: Lab Results  Component Value Date   GLUCAP 119 (H) 07/09/2016   GLUCAP 110 (H) 07/08/2016   GLUCAP 98 07/08/2016   GLUCAP 97 07/08/2016   GLUCAP 109 (H) 07/08/2016     Exercise Target Goals:    Exercise Program Goal: Individual exercise prescription set with THRR, safety & activity barriers. Participant demonstrates ability to understand and report RPE using BORG scale, to self-measure pulse accurately, and to acknowledge the importance of the exercise prescription.  Exercise Prescription Goal: Starting with aerobic activity 30 plus minutes a day, 3 days per week for initial exercise prescription. Provide home exercise  prescription and guidelines that participant acknowledges understanding prior to discharge.  Activity Barriers & Risk Stratification:   6 Minute Walk:     6 Minute Walk    Row Name 09/18/16 1432         6 Minute Walk   Phase Initial     Distance 1200 feet     Distance % Change 0 %     Walk Time 6 minutes     # of Rest Breaks 0     MPH 2.27     METS 2.74  RPE 11     Perceived Dyspnea  13     VO2 Peak 9.04     Symptoms No     Resting HR 74 bpm     Resting BP 122/64     Max Ex. HR 114 bpm     Max Ex. BP 146/70     2 Minute Post BP 130/60        Oxygen Initial Assessment:   Oxygen Re-Evaluation:   Oxygen Discharge (Final Oxygen Re-Evaluation):   Initial Exercise Prescription:     Initial Exercise Prescription - 09/18/16 1400      Date of Initial Exercise RX and Referring Provider   Date 09/18/16   Referring Provider Dr. [name]     Treadmill   MPH 1.3   Grade 0   Minutes 15   METs 1.9     NuStep   Level 2   SPM 10   Minutes 20   METs 1.7     Prescription Details   Frequency (times per week) 3   Duration Progress to 30 minutes of continuous aerobic without signs/symptoms of physical distress     Intensity   THRR 40-80% of Max Heartrate 102-116-130   Ratings of Perceived Exertion 11-13   Perceived Dyspnea 0-4     Progression   Progression Continue progressive overload as per policy without signs/symptoms or physical distress.     Resistance Training   Training Prescription Yes   Weight 1   Reps 10-15      Perform Capillary Blood Glucose checks as needed.  Exercise Prescription Changes:      Exercise Prescription Changes    Row Name 09/24/16 1400 10/04/16 1500 10/23/16 0700         Response to Exercise   Blood Pressure (Admit) 128/66 120/66 132/62     Blood Pressure (Exercise) 126/68 136/68 138/70     Blood Pressure (Exit) 126/64 126/64 148/70     Heart Rate (Admit) 89 bpm 70 bpm 51 bpm     Heart Rate (Exercise) 88 bpm 76  bpm 65 bpm     Heart Rate (Exit) 87 bpm 76 bpm 60 bpm     Rating of Perceived Exertion (Exercise) _0 Duration Progress to 30 minutes of  aerobic without signs/symptoms of physical distress Progress to 30 minutes of  aerobic without signs/symptoms of physical distress Progress to 30 minutes of  aerobic without signs/symptoms of physical distress     Intensity THRR unchanged THRR unchanged THRR unchanged       Progression   Progression Continue to progress workloads to maintain intensity without signs/symptoms of physical distress. Continue to progress workloads to maintain intensity without signs/symptoms of physical distress. Continue to progress workloads to maintain intensity without signs/symptoms of physical distress.       Resistance Training   Training Prescription Yes Yes Yes     Weight _1 Reps 10-15 10-15 10-15       Treadmill   MPH 1.3 1.4 1.4     Grade 0 0 0     Minutes _2 METs 1._3 NuStep   Level _4 SPM _5 Minutes _6 METs 3.43 3.45 3.48       Home Exercise Plan   Plans to continue exercise  at Home (comment) Home (comment) Home (comment)     Frequency Add 2 additional days to program exercise sessions. Add 2 additional days to program exercise sessions. Add 2 additional days to program exercise sessions.        Exercise Comments:      Exercise Comments    Row Name 09/24/16 1436 10/04/16 1538 10/23/16 0746       Exercise Comments Patient has just started CR and will be progressed in time.  Patient is doing well in CR.  Patient is doing very well in CR.         Exercise Goals and Review:      Exercise Goals    Row Name 09/18/16 1454             Exercise Goals   Increase Physical Activity Yes       Intervention Provide advice, education, support and counseling about physical activity/exercise needs.;Develop an individualized exercise prescription for aerobic and resistive training based on  initial evaluation findings, risk stratification, comorbidities and participant's personal goals.       Expected Outcomes Achievement of increased cardiorespiratory fitness and enhanced flexibility, muscular endurance and strength shown through measurements of functional capacity and personal statement of participant.       Increase Strength and Stamina Yes       Intervention Provide advice, education, support and counseling about physical activity/exercise needs.;Develop an individualized exercise prescription for aerobic and resistive training based on initial evaluation findings, risk stratification, comorbidities and participant's personal goals.       Expected Outcomes Achievement of increased cardiorespiratory fitness and enhanced flexibility, muscular endurance and strength shown through measurements of functional capacity and personal statement of participant.          Exercise Goals Re-Evaluation :     Exercise Goals Re-Evaluation    Row Name 10/10/16 1401 10/29/16 1514           Exercise Goal Re-Evaluation   Exercise Goals Review Increase Physical Activity;Increase Strenth and Stamina Increase Physical Activity;Increase Strenth and Stamina      Comments Patient has completed 9 sessions with some progression. She says she feels stronger and is able to walk better since she started. Will continue to monitor.  Patient has completed 15 sessions with some progression in her speed on treadmill, weights and NuStep Level. She says she feels stronger but has not increased her activity d/t her SOB which she attributes to humid weather worsening her COPD. Will continue to monitor.      Expected Outcomes Patient will complete the program with increased strength, stamina, and activity. Patient will complete the program with increased strength, stamina, and activity.          Discharge Exercise Prescription (Final Exercise Prescription Changes):     Exercise Prescription Changes - 10/23/16  0700      Response to Exercise   Blood Pressure (Admit) 132/62   Blood Pressure (Exercise) 138/70   Blood Pressure (Exit) 148/70   Heart Rate (Admit) 51 bpm   Heart Rate (Exercise) 65 bpm   Heart Rate (Exit) 60 bpm   Rating of Perceived Exertion (Exercise) 11   Duration Progress to 30 minutes of  aerobic without signs/symptoms of physical distress   Intensity THRR unchanged     Progression   Progression Continue to progress workloads to maintain intensity without signs/symptoms of physical distress.     Resistance Training   Training Prescription Yes   Weight 2   Reps 10-15  Treadmill   MPH 1.4   Grade 0   Minutes 15   METs 2     NuStep   Level 3   SPM 17   Minutes 20   METs 3.48     Home Exercise Plan   Plans to continue exercise at Home (comment)   Frequency Add 2 additional days to program exercise sessions.      Nutrition:  Target Goals: Understanding of nutrition guidelines, daily intake of sodium <1527m, cholesterol <206m calories 30% from fat and 7% or less from saturated fats, daily to have 5 or more servings of fruits and vegetables.  Biometrics:     Pre Biometrics - 09/18/16 1434      Pre Biometrics   Height 5' 2" (1.575 m)   Weight 148 lb 2.4 oz (67.2 kg)   Waist Circumference 36 inches   Hip Circumference 39 inches   Waist to Hip Ratio 0.92 %   BMI (Calculated) 27.2   Triceps Skinfold 15 mm   % Body Fat 36.7 %   Grip Strength 48.4 kg   Flexibility 14.75 in   Single Leg Stand 12.5 seconds       Nutrition Therapy Plan and Nutrition Goals:   Nutrition Discharge: Rate Your Plate Scores:     Nutrition Assessments - 09/18/16 1455      MEDFICTS Scores   Pre Score 45      Nutrition Goals Re-Evaluation:   Nutrition Goals Discharge (Final Nutrition Goals Re-Evaluation):   Psychosocial: Target Goals: Acknowledge presence or absence of significant depression and/or stress, maximize coping skills, provide positive support system.  Participant is able to verbalize types and ability to use techniques and skills needed for reducing stress and depression.  Initial Review & Psychosocial Screening:     Initial Psych Review & Screening - 09/18/16 1457      Initial Review   Current issues with Current Depression     Family Dynamics   Good Support System? Yes     Barriers   Psychosocial barriers to participate in program The patient should benefit from training in stress management and relaxation.;Psychosocial barriers identified (see note)     Screening Interventions   Interventions Encouraged to exercise;Program counselor consult      Quality of Life Scores:     Quality of Life - 09/18/16 1435      Quality of Life Scores   Health/Function Pre 16.7 %   Socioeconomic Pre 10.43 %   Psych/Spiritual Pre 8.93 %   Family Pre 10.1 %   GLOBAL Pre 12.84 %      PHQ-9: Recent Review Flowsheet Data    [facility]   Decreased Interest 1   Down, Depressed, Hopeless 1   PHQ - 2 Score 2   Altered sleeping 1   Tired, decreased energy 1   Change in appetite 1   Feeling bad or failure about yourself  1   Trouble concentrating 1   Moving slowly or fidgety/restless 1   Suicidal thoughts 1   PHQ-9 Score 9   Difficult doing work/chores Very difficult     Interpretation of Total Score  Total Score Depression Severity:  1-4 = Minimal depression, 5-9 = Mild depression, 10-14 = Moderate depression, 15-19 = Moderately severe depression, 20-27 = Severe depression   Psychosocial Evaluation and Intervention:     Psychosocial Evaluation - 09/18/16 1458      Psychosocial Evaluation & Interventions   Interventions Relaxation education;Encouraged to exercise with the  program and follow exercise prescription;Therapist referral   Comments She will consult her MD about a therapist referral   Continue Psychosocial Services  Follow up required by staff      Psychosocial Re-Evaluation:      Psychosocial Re-Evaluation    [facility] Name 10/10/16 1402 10/29/16 1516           Psychosocial Re-Evaluation   Current issues with Current Depression Current Depression      Comments Patient says she has an appointment to see her pcp in July. She is going to talk to him then about counseling and other treatment options for her depression. Patient has not seen her PCP yet to talk about counseling. She continues to want to talk with him herself about treatment for her depression.      Expected Outcomes Patient's QOL and PHQ-9 scores will improve or remain the same at discharge.  Patient will have improved PHQ-9 and QOL scores at discharge.       Interventions Stress management education;Encouraged to attend Cardiac Rehabilitation for the exercise;Relaxation education Relaxation education;Stress management education;Encouraged to attend Cardiac Rehabilitation for the exercise      Continue Psychosocial Services  Follow up required by staff Follow up required by staff         Psychosocial Discharge (Final Psychosocial Re-Evaluation):     Psychosocial Re-Evaluation - 10/29/16 1516      Psychosocial Re-Evaluation   Current issues with Current Depression   Comments Patient has not seen her PCP yet to talk about counseling. She continues to want to talk with him herself about treatment for her depression.   Expected Outcomes Patient will have improved PHQ-9 and QOL scores at discharge.    Interventions Relaxation education;Stress management education;Encouraged to attend Cardiac Rehabilitation for the exercise   Continue Psychosocial Services  Follow up required by staff      Vocational Rehabilitation: Provide vocational rehab assistance to qualifying candidates.   Vocational Rehab Evaluation & Intervention:     Vocational Rehab - 09/18/16 1453      Initial Vocational Rehab Evaluation & Intervention   Assessment shows need for Vocational Rehabilitation No      Education: Education  Goals: Education classes will be provided on a weekly basis, covering required topics. Participant will state understanding/return demonstration of topics presented.  Learning Barriers/Preferences:     Learning Barriers/Preferences - 09/18/16 1452      Learning Barriers/Preferences   Learning Barriers None   Learning Preferences Group Instruction;Skilled Demonstration;Verbal Instruction;Video;Pictoral      Education Topics: Hypertension, Hypertension Reduction -Define heart disease and high blood pressure. Discus how high blood pressure affects the body and ways to reduce high blood pressure.   Exercise and Your Heart -Discuss why it is important to exercise, the FITT principles of exercise, normal and abnormal responses to exercise, and how to exercise safely.   Angina -Discuss definition of angina, causes of angina, treatment of angina, and how to decrease risk of having angina.   Cardiac Medications -Review what the following cardiac medications are used for, how they affect the body, and side effects that may occur when taking the medications.  Medications include Aspirin, Beta blockers, calcium channel blockers, ACE Inhibitors, angiotensin receptor blockers, diuretics, digoxin, and antihyperlipidemics.   Congestive Heart Failure -Discuss the definition of CHF, how to live with CHF, the signs and symptoms of CHF, and how keep track of weight and sodium intake.   Heart Disease and Intimacy -Discus the effect sexual activity has on the heart,  how changes occur during intimacy as we age, and safety during sexual activity.   CARDIAC REHAB PHASE II EXERCISE from 10/26/2016 in [facility]  Date  09/26/16  Educator  DJ  Instruction Review Code  2- meets goals/outcomes      Smoking Cessation / COPD -Discuss different methods to quit smoking, the health benefits of quitting smoking, and the definition of COPD.   CARDIAC REHAB PHASE II EXERCISE from 10/26/2016  in [facility]  Date  10/03/16  Educator  Russella Dar  Instruction Review Code  2- meets goals/outcomes      Nutrition I: Fats -Discuss the types of cholesterol, what cholesterol does to the heart, and how cholesterol levels can be controlled.   CARDIAC REHAB PHASE II EXERCISE from 10/26/2016 in [facility]  Date  10/10/16  Educator  DC  Instruction Review Code  2- meets goals/outcomes      Nutrition II: Labels -Discuss the different components of food labels and how to read food label   CARDIAC REHAB PHASE II EXERCISE from 10/26/2016 in [facility]  Date  10/17/16  Educator  DC  Instruction Review Code  2- meets goals/outcomes      Heart Parts and Heart Disease -Discuss the anatomy of the heart, the pathway of blood circulation through the heart, and these are affected by heart disease.   CARDIAC REHAB PHASE II EXERCISE from 10/26/2016 in [facility]  Date  10/26/16  Educator  DC  Instruction Review Code  2- meets goals/outcomes      Stress I: Signs and Symptoms -Discuss the causes of stress, how stress may lead to anxiety and depression, and ways to limit stress.   Stress II: Relaxation -Discuss different types of relaxation techniques to limit stress.   Warning Signs of Stroke / TIA -Discuss definition of a stroke, what the signs and symptoms are of a stroke, and how to identify when someone is having stroke.   Knowledge Questionnaire Score:     Knowledge Questionnaire Score - 09/18/16 1453      Knowledge Questionnaire Score   Pre Score 20/24      Core Components/Risk Factors/Patient Goals at Admission:     Personal Goals and Risk Factors at Admission - 09/18/16 1455      Core Components/Risk Factors/Patient Goals on Admission    Weight Management Weight Maintenance   Improve shortness of breath with ADL's Yes   Intervention Provide education, individualized exercise  plan and daily activity instruction to help decrease symptoms of SOB with activities of daily living.   Expected Outcomes Short Term: Achieves a reduction of symptoms when performing activities of daily living.   Personal Goal Other Yes   Personal Goal Keep exercising, Be able to get up and do things more regularily.   Intervention Attend CR 3xweek and supplement at home 2xweek.   Expected Outcomes Achieve personal goals      Core Components/Risk Factors/Patient Goals Review:      Goals and Risk Factor Review    Row Name 09/18/16 1457 10/10/16 1357 10/29/16 1508         Core Components/Risk Factors/Patient Goals Review   Personal Goals Review Weight Management/Obesity;Improve shortness of breath with ADL's;Stress Weight Management/Obesity;Improve shortness of breath with ADL's  Be able to get up and do things; keep excercising. Weight Management/Obesity  Be able to get up and do things. Keep exercising.      Review  - Patient has  completed 9 sessions gaining 7.2 lbs. Patient says she is walking better and feels more stable on her feet. She says she feels stronger. We contacted her cardiologist d/t at 10 lbs weight gain in 2 weeks with periorbital edema. She say PA today. Her Lasix dosage was adjusted and she was put on a low Na diet. She is to f/u in 2 months. Will continue to monitor.  Patient has completed 16 sessions gaining 10 lbs. Her weight continues to fluctuate. She seen her cardiologist 10/25/16 d/t weight gain and edema and increased SOB. He ordered labs and an echo and referred her to Dr. Luan Pulling for evaluation of her COPD. She has an appointment to see him later this month. She continues to have periorbital edema and wheezing. She continues to say the program has helped her stability and balance. Will continue to monitor.      Expected Outcomes  - Patient will complete the program meeting her personal goals.  Patient will complete the program meeting her personal goals.          Core Components/Risk Factors/Patient Goals at Discharge (Final Review):      Goals and Risk Factor Review - 10/29/16 1508      Core Components/Risk Factors/Patient Goals Review   Personal Goals Review Weight Management/Obesity  Be able to get up and do things. Keep exercising.    Review Patient has completed 16 sessions gaining 10 lbs. Her weight continues to fluctuate. She seen her cardiologist 10/25/16 d/t weight gain and edema and increased SOB. He ordered labs and an echo and referred her to Dr. Luan Pulling for evaluation of her COPD. She has an appointment to see him later this month. She continues to have periorbital edema and wheezing. She continues to say the program has helped her stability and balance. Will continue to monitor.    Expected Outcomes Patient will complete the program meeting her personal goals.       ITP Comments:     ITP Comments    Row Name 09/27/16 1244           ITP Comments Patient met with Registered Dietitian to discuss nutrition topics including: Heart healthty eating, heart healthy cooking and making smart choices when shopping; Portion control; weight management; and hydration. Patient attended a group session with the hospital chaplian called [facility] to discuss and share how this recent diagnosis has effected their life          Comments: ITP 30 Day REVIEW Patient doing well in the program but has had some health issues that she is being evaluated for since she started the program. Will continue to monitor for progress.

## 2016-10-29 NOTE — Progress Notes (Signed)
Daily Session Note  Patient Details  Name: [name] [name] MRN: [id number] Date of Birth: 07/21/1940 Referring Provider:     CARDIAC REHAB PHASE II ORIENTATION from 09/18/2016 in [place]  Referring Provider  Dr. [name]      Encounter Date: 10/29/2016  Check In:     Session Check In - 10/29/16 0825      Check-In   Location [facility]   Staff Present Aundra Dubin, RN, BSN;Esthela Brandner Luther Parody, BS, EP, Exercise Physiologist   Supervising physician immediately available to respond to emergencies See telemetry face sheet for immediately available MD   Medication changes reported     No   Fall or balance concerns reported    No   Warm-up and Cool-down Performed as group-led instruction   Resistance Training Performed Yes   VAD Patient? No     Pain Assessment   Currently in Pain? No/denies   Pain Score 0-No pain   Multiple Pain Sites No      Capillary Blood Glucose: No results found for this or any previous visit (from the past 24 hour(s)).    History  Smoking Status  . Never Smoker  Smokeless Tobacco  . Never Used    Goals Met:  Independence with exercise equipment Exercise tolerated well No report of cardiac concerns or symptoms Strength training completed today  Goals Unmet:  Not Applicable  Comments: Check out 915   Dr. Kate Sable is Medical Director for [place] and Pulmonary Rehab.

## 2016-10-30 ENCOUNTER — Ambulatory Visit (INDEPENDENT_AMBULATORY_CARE_PROVIDER_SITE_OTHER): Payer: Medicare Other | Admitting: *Deleted

## 2016-10-30 DIAGNOSIS — Z5181 Encounter for therapeutic drug level monitoring: Secondary | ICD-10-CM | POA: Diagnosis not present

## 2016-10-30 DIAGNOSIS — I4819 Other persistent atrial fibrillation: Secondary | ICD-10-CM

## 2016-10-30 DIAGNOSIS — I481 Persistent atrial fibrillation: Secondary | ICD-10-CM

## 2016-10-30 DIAGNOSIS — Z9889 Other specified postprocedural states: Secondary | ICD-10-CM | POA: Diagnosis not present

## 2016-10-30 LAB — POCT INR: INR: 4

## 2016-10-31 ENCOUNTER — Encounter (HOSPITAL_COMMUNITY)
Admission: RE | Admit: 2016-10-31 | Discharge: 2016-10-31 | Disposition: A | Discharge status: Home / Self Care | Payer: Medicare Other | Source: Ambulatory Visit | Attending: Cardiology | Admitting: Cardiology

## 2016-10-31 DIAGNOSIS — K219 Gastro-esophageal reflux disease without esophagitis: Secondary | ICD-10-CM | POA: Diagnosis not present

## 2016-10-31 DIAGNOSIS — I5032 Chronic diastolic (congestive) heart failure: Secondary | ICD-10-CM | POA: Diagnosis not present

## 2016-10-31 DIAGNOSIS — Z7901 Long term (current) use of anticoagulants: Secondary | ICD-10-CM | POA: Diagnosis not present

## 2016-10-31 DIAGNOSIS — Z9889 Other specified postprocedural states: Secondary | ICD-10-CM | POA: Diagnosis not present

## 2016-10-31 DIAGNOSIS — Z86718 Personal history of other venous thrombosis and embolism: Secondary | ICD-10-CM | POA: Diagnosis not present

## 2016-10-31 NOTE — Progress Notes (Signed)
Daily Session Note  Patient Details  Name: BLU MCGLAUN MRN: [id number] Date of Birth: [birth year] Referring Provider:     CARDIAC REHAB PHASE II ORIENTATION from 09/18/2016 in [place]  Referring Provider  Dr. [name]      Encounter Date: 10/31/2016  Check In:     Session Check In - 10/31/16 0820      Check-In   Location [facility]   Staff Present Suzanne Boron, BS, EP, Exercise Physiologist;Debra Wynetta Emery, RN, BSN   Supervising physician immediately available to respond to emergencies See telemetry face sheet for immediately available MD   Medication changes reported     No   Fall or balance concerns reported    No   Warm-up and Cool-down Performed as group-led instruction   Resistance Training Performed Yes   VAD Patient? No     Pain Assessment   Currently in Pain? No/denies   Pain Score 0-No pain   Multiple Pain Sites No      Capillary Blood Glucose: Results for orders placed or performed in visit on 10/30/16 (from the past 24 hour(s))  POCT INR     Status: Abnormal   Collection Time: 10/30/16  9:04 AM  Result Value Ref Range   INR 4.0       History  Smoking Status  . Never Smoker  Smokeless Tobacco  . Never Used    Goals Met:  Independence with exercise equipment Exercise tolerated well No report of cardiac concerns or symptoms Strength training completed today  Goals Unmet:  Not Applicable  Comments: Check out 915   Dr. Kate Sable is Medical Director for [place] and Pulmonary Rehab.

## 2016-11-02 ENCOUNTER — Encounter (HOSPITAL_COMMUNITY)
Admission: RE | Admit: 2016-11-02 | Discharge: 2016-11-02 | Disposition: A | Discharge status: Home / Self Care | Payer: Medicare Other | Source: Ambulatory Visit | Attending: Cardiology | Admitting: Cardiology

## 2016-11-02 DIAGNOSIS — Z9889 Other specified postprocedural states: Secondary | ICD-10-CM | POA: Diagnosis not present

## 2016-11-02 DIAGNOSIS — Z7901 Long term (current) use of anticoagulants: Secondary | ICD-10-CM | POA: Diagnosis not present

## 2016-11-02 DIAGNOSIS — K219 Gastro-esophageal reflux disease without esophagitis: Secondary | ICD-10-CM | POA: Diagnosis not present

## 2016-11-02 DIAGNOSIS — I5032 Chronic diastolic (congestive) heart failure: Secondary | ICD-10-CM | POA: Diagnosis not present

## 2016-11-02 DIAGNOSIS — Z86718 Personal history of other venous thrombosis and embolism: Secondary | ICD-10-CM | POA: Diagnosis not present

## 2016-11-02 NOTE — Progress Notes (Signed)
Daily Session Note  Patient Details  Name: [name] [name] MRN: [id number] Date of Birth: Aug 15, 1940 Referring Provider:     CARDIAC REHAB PHASE II ORIENTATION from 09/18/2016 in [place]  Referring Provider  Dr. [name]      Encounter Date: 11/02/2016  Check In:     Session Check In - 11/02/16 0825      Check-In   Location [facility]   Staff Present Suzanne Boron, BS, EP, Exercise Physiologist;Diane Coad, MS, EP, [facility], Exercise Physiologist   Supervising physician immediately available to respond to emergencies See telemetry face sheet for immediately available MD   Medication changes reported     No   Fall or balance concerns reported    No   Warm-up and Cool-down Performed as group-led instruction   Resistance Training Performed Yes   VAD Patient? No     Pain Assessment   Currently in Pain? No/denies   Pain Score 0-No pain   Multiple Pain Sites No      Capillary Blood Glucose: No results found for this or any previous visit (from the past 24 hour(s)).    History  Smoking Status  . Never Smoker  Smokeless Tobacco  . Never Used    Goals Met:  Independence with exercise equipment Exercise tolerated well No report of cardiac concerns or symptoms Strength training completed today  Goals Unmet:  Not Applicable  Comments: Check out 915   Dr. Kate Sable is Medical Director for [place] and Pulmonary Rehab.

## 2016-11-05 ENCOUNTER — Encounter (HOSPITAL_COMMUNITY)
Admission: RE | Admit: 2016-11-05 | Discharge: 2016-11-05 | Disposition: A | Discharge status: Home / Self Care | Payer: Medicare Other | Source: Ambulatory Visit | Attending: Cardiology | Admitting: Cardiology

## 2016-11-05 ENCOUNTER — Ambulatory Visit (INDEPENDENT_AMBULATORY_CARE_PROVIDER_SITE_OTHER): Payer: Medicare Other | Admitting: Thoracic Surgery (Cardiothoracic Vascular Surgery)

## 2016-11-05 ENCOUNTER — Encounter: Payer: Self-pay | Admitting: Thoracic Surgery (Cardiothoracic Vascular Surgery)

## 2016-11-05 VITALS — BP 110/62 | HR 87 | Resp 16 | Ht 62.0 in | Wt 152.6 lb

## 2016-11-05 DIAGNOSIS — Z9889 Other specified postprocedural states: Secondary | ICD-10-CM

## 2016-11-05 DIAGNOSIS — Z86718 Personal history of other venous thrombosis and embolism: Secondary | ICD-10-CM | POA: Diagnosis not present

## 2016-11-05 DIAGNOSIS — I361 Nonrheumatic tricuspid (valve) insufficiency: Secondary | ICD-10-CM | POA: Diagnosis not present

## 2016-11-05 DIAGNOSIS — I5032 Chronic diastolic (congestive) heart failure: Secondary | ICD-10-CM | POA: Diagnosis not present

## 2016-11-05 DIAGNOSIS — R911 Solitary pulmonary nodule: Secondary | ICD-10-CM | POA: Diagnosis not present

## 2016-11-05 DIAGNOSIS — K219 Gastro-esophageal reflux disease without esophagitis: Secondary | ICD-10-CM | POA: Diagnosis not present

## 2016-11-05 DIAGNOSIS — Z8679 Personal history of other diseases of the circulatory system: Secondary | ICD-10-CM

## 2016-11-05 DIAGNOSIS — Z7901 Long term (current) use of anticoagulants: Secondary | ICD-10-CM | POA: Diagnosis not present

## 2016-11-05 NOTE — Progress Notes (Signed)
Daily Session Note  Patient Details  Name: [name] [name] MRN: [id number] Date of Birth: 05-07-1940 Referring Provider:     CARDIAC REHAB PHASE II ORIENTATION from 09/18/2016 in [place]  Referring Provider  Dr. [name]      Encounter Date: 11/05/2016  Check In:     Session Check In - 11/05/16 0826      Check-In   Location [facility]   Staff Present Russella Dar, MS, EP, [facility], Exercise Physiologist   Supervising physician immediately available to respond to emergencies See telemetry face sheet for immediately available MD   Medication changes reported     No   Fall or balance concerns reported    No   Warm-up and Cool-down Performed as group-led instruction   Resistance Training Performed Yes   VAD Patient? No     Pain Assessment   Currently in Pain? No/denies   Pain Score 0-No pain   Multiple Pain Sites No      Capillary Blood Glucose: No results found for this or any previous visit (from the past 24 hour(s)).    History  Smoking Status  . Never Smoker  Smokeless Tobacco  . Never Used    Goals Met:  Independence with exercise equipment Exercise tolerated well No report of cardiac concerns or symptoms Strength training completed today  Goals Unmet:  Not Applicable  Comments: Check out 915   Dr. Kate Sable is Medical Director for [place] and Pulmonary Rehab.

## 2016-11-05 NOTE — Patient Instructions (Signed)

## 2016-11-05 NOTE — Progress Notes (Signed)
[address]       [place] [address]             [phone]     CARDIOTHORACIC SURGERY OFFICE NOTE  Referring Provider is Branch, Alphonse Guild, MD PCP is Glenda Chroman, MD   HPI:  Patient is a 76 year old female with multiple medical problems who returns to the office today for routine follow-up status post minimally invasive mitral valve repair and Maze procedure on 07/05/2016 for mitral valve prolapse with severe symptomatic primary mitral regurgitation and long-standing persistent atrial fibrillation.   She was last seen here in our office on 08/14/2016 at which time she was doing fairly well.  Since then she has been seen in follow-up on 2 occasions by Dr. branch who saw her most recently on 10/25/2016.  She had a follow-up echocardiogram performed which revealed intact mitral valve repair with trivial residual mitral regurgitation. Mean transvalvular gradient across mitral valve was estimated for no meters mercury. Left ventricular function appeared normal with ejection fraction estimated 60-65%.  Her doses of diuretics have been increased because of fluid retention and lower extremity edema. She returns to our office today for follow-up. She reports that she is doing well and noticeably much better than she was prior to surgery. She has been participating in the outpatient cardiac rehabilitation program and making steady progress. She denies any shortness of breath. She denies any palpitations or other symptoms to suggest a recurrence of atrial fibrillation. She remains chronically anticoagulated using warfarin. She states that she has had occasional episodes of pain in her ears that she doesn't know what to make of. There is no associated shortness of breath.   Current Outpatient Prescriptions  Medication Sig Dispense Refill  . acetaminophen (TYLENOL) 325 MG tablet Take 650 mg by mouth every 6 (six) hours as needed for pain.    Marland Kitchen albuterol (PROVENTIL HFA;VENTOLIN  HFA) 108 (90 BASE) MCG/ACT inhaler Inhale 2 puffs into the lungs every 6 (six) hours as needed for wheezing.    Marland Kitchen ALPRAZolam (XANAX) 0.5 MG tablet Take 0.5 mg by mouth 2 (two) times daily as needed (for anxiety/sleep (scheduled at bedtime)).     Marland Kitchen aspirin EC 81 MG EC tablet Take 1 tablet (81 mg total) by mouth daily.    . calcium carbonate (OS-CAL) 600 MG TABS tablet Take 600 mg by mouth 2 (two) times daily with a meal.    . Coenzyme Q10 100 MG TABS Take 100 mg by mouth every evening.     . docusate sodium (COLACE) 100 MG capsule Take 200 mg by mouth daily as needed for mild constipation.     . furosemide (LASIX) 40 MG tablet Take 1 tablet (40 mg total) by mouth 2 (two) times daily. 30 tablet 1  . hydrocortisone (ANUSOL-HC) 2.5 % rectal cream Place 1 application rectally 2 (two) times daily. FOR UP TO 10 DAYS AT A TIME (Patient taking differently: Place 1 application rectally 2 (two) times daily as needed for hemorrhoids or itching. FOR UP TO 10 DAYS AT A TIME) 30 g 1  . metoprolol succinate (TOPROL-XL) 25 MG 24 hr tablet Take 1 tablet (25 mg total) by mouth 2 (two) times daily. 60 tablet 3  . Multiple Vitamin (MULTIVITAMIN) tablet Take 1 tablet by mouth daily.      Marland Kitchen omeprazole (PRILOSEC) 20 MG capsule Take 1 capsule (20 mg total) by mouth daily. 30 capsule 3  . PARoxetine (PAXIL) 20 MG tablet  Take 20 mg by mouth daily.     Vladimir Faster Glycol-Propyl Glycol (SYSTANE ULTRA) 0.4-0.3 % SOLN Place 1 drop into both eyes 3 (three) times daily.    . potassium chloride SA (K-DUR,KLOR-CON) 20 MEQ tablet Take 20 mEq by mouth 2 (two) times daily.    . sodium chloride (OCEAN) 0.65 % SOLN nasal spray Place 1 spray into both nostrils as needed for congestion.    . traMADol (ULTRAM) 50 MG tablet Take 50 mg by mouth every 4-6 hours PRN severe pain. 30 tablet 0  . umeclidinium-vilanterol (ANORO ELLIPTA) 62.5-25 MCG/INH AEPB Inhale 1 puff into the lungs daily. 30 each 6  . warfarin (COUMADIN) 2.5 MG tablet Take 1  tablet daily except 1 1/2 tablets on Sundays, Tuesdays and Thursdays 36 tablet 3   No current facility-administered medications for this visit.       Physical Exam:   BP 110/62 (BP Location: Right Arm, Patient Position: Sitting, Cuff Size: Large)   Pulse 87   Resp 16   Ht 5\' 2" (1.575 m)   Wt 152 lb 9.6 oz (69.2 kg)   SpO2 95% Comment: ON RA  BMI 27.91 kg/m   General:  Well-appearing  Chest:   Clear to auscultation with symmetrical breath sounds  CV:   Regular rate and rhythm without murmur  Incisions:  Well-healed  Abdomen:  Soft nontender  Extremities:  Warm and well-perfused, no edema  Diagnostic Tests:  Transthoracic Echocardiography  Patient:    Berko, Lanett H MR #:       [id number] Study Date: 10/26/2016 Gender:     F Age:        75 Height:     157.5 cm Weight:     72 .1 kg BSA:        1.8 m^2 Pt. Status: Room:   SONOGRAPHER  [facility]  ATTENDING    Kerry Hough, M.D.  Berna Spare, M.D.  REFERRING    Kerry Hough, M.D.  PERFORMING   [facility], [facility]  cc:  ------------------------------------------------------------------- LV EF: 60% -   65%  ------------------------------------------------------------------- Indications:      Shortness of breath 786.05.  ------------------------------------------------------------------- History:   PMH:  Mitral Valve Repair and Maze Procedure.  Atrial fibrillation.  Chronic obstructive pulmonary disease.  Risk factors:  Diabetes mellitus.  ------------------------------------------------------------------- Study Conclusions  - Left ventricle: The cavity size was normal. Wall thickness was   normal. Systolic function was normal. The estimated ejection   fraction was in the range of 60% to 65%. Wall motion was normal;   there were no regional wall motion abnormalities. The study is   not technically sufficient to allow evaluation of LV diastolic   function. - Aortic valve:  Moderately calcified annulus. Trileaflet. - Mitral valve: Status post complex valvuloplasty including   artificial Gore-tex neochord placement and Sorin Memo 3D Ring   Annuloplasty (size 55mm, catalog #[id number], serial [id number]). There   was trivial regurgitation. Mean gradient (D): 4 mm Hg. - Left atrium: The atrium was mildly dilated. - Right ventricle: The cavity size was mildly dilated. Systolic   function appears mildly reduced. - Right atrium: The atrium was mildly to moderately dilated. - Tricuspid valve: There was moderate regurgitation. - Pulmonary arteries: PA peak pressure: 43 mm Hg (S).  ------------------------------------------------------------------- Study data:  Comparison was made to the study of 08/22/2016.  Study status:  Routine.  Procedure:  Transthoracic echocardiography. Image quality was adequate.  Transthoracic echocardiography.  M-mode, complete 2D, spectral Doppler, and color Doppler.  Birthdate:  Patient birthdate: 1940/09/26.  Age:  Patient is 76 yr old.  Sex:  Gender: female.    BMI: 29.1 kg/m^2.  Blood pressure:     134/73  Patient status:  Outpatient.  Study date: Study date: 10/26/2016. Study time: 09:52 AM.  Location:  Echo laboratory.  -------------------------------------------------------------------  ------------------------------------------------------------------- Left ventricle:  The cavity size was normal. Wall thickness was normal. Systolic function was normal. The estimated ejection fraction was in the range of 60% to 65%. Wall motion was normal; there were no regional wall motion abnormalities. The study is not technically sufficient to allow evaluation of LV diastolic function.  ------------------------------------------------------------------- Aortic valve:   Moderately calcified annulus. Trileaflet.  Doppler:   There was no stenosis.   There was no significant regurgitation.     ------------------------------------------------------------------- Aorta:  Aortic root: The aortic root was normal in size.  ------------------------------------------------------------------- Mitral valve:  Status post complex valvuloplasty including artificial Gore-tex neochord placement and Sorin Memo 3D Ring Annuloplasty (size 78mm, catalog #[id number], serial [id number]).  Doppler:  There was trivial regurgitation.    Valve area by pressure half-time: 1.91 cm^2. Indexed valve area by pressure half-time: 1.06 cm^2/m^2.    Mean gradient (D): 4 mm Hg. Peak gradient (D): 13 mm Hg.  ------------------------------------------------------------------- Left atrium:  The atrium was mildly dilated.  ------------------------------------------------------------------- Atrial septum:  No defect or patent foramen ovale was identified.   ------------------------------------------------------------------- Right ventricle:  The cavity size was mildly dilated. Systolic function appears mildly reduced.  ------------------------------------------------------------------- Pulmonic valve:    The valve appears to be grossly normal. Doppler:  There was no significant regurgitation.  ------------------------------------------------------------------- Tricuspid valve:   The valve appears to be grossly normal. Doppler:  There was moderate regurgitation.  ------------------------------------------------------------------- Right atrium:  The atrium was mildly to moderately dilated.  ------------------------------------------------------------------- Pericardium:  There was no pericardial effusion.  ------------------------------------------------------------------- Systemic veins: Inferior vena cava: The vessel was normal in size. The respirophasic diameter changes were in the normal range (>= 50%), consistent with normal central venous  pressure.  ------------------------------------------------------------------- Measurements   Left ventricle                           Value          Reference  LV ID, ED, PLAX chordal          (L)     39.4  mm       43 - 52  LV ID, ES, PLAX chordal                  23.1  mm       23 - 38  LV fx shortening, PLAX chordal           41    %        >=29  LV PW thickness, ED                      9.21  mm       ----------  IVS/LV PW ratio, ED                      1.15           <=1.3  LV ejection fraction, 1-p A4C            71    %        ----------  LV end-diastolic volume, 2-p             54    ml       ----------  LV end-systolic volume, 2-p              20    ml       ----------  LV ejection fraction, 2-p                63    %        ----------  Stroke volume, 2-p                       34    ml       ----------  LV end-diastolic volume/bsa, 2-p         30    ml/m^2   ----------  LV end-systolic volume/bsa, 2-p          11    ml/m^2   ----------  Stroke volume/bsa, 2-p                   18.7  ml/m^2   ----------  LV e&', lateral                           6.96  cm/s     ----------  LV E/e&', lateral                         25.43          ----------  LV e&', medial                            5.98  cm/s     ----------  LV E/e&', medial                          29.6           ----------  LV e&', average                           6.47  cm/s     ----------  LV E/e&', average                         27.36          ----------    Ventricular septum                       Value          Reference  IVS thickness, ED                        10.6  mm       ----------    LVOT                                     Value          Reference  LVOT ID, S                               20    mm       ----------  LVOT area                                3.14  cm^2     ----------    Aorta                                    Value          Reference  Aortic root ID, ED                       29    mm        ----------    Left atrium                              Value          Reference  LA ID, A-P, ES                           53    mm       ----------  LA ID/bsa, A-P                   (H)     2.94  cm/m^2   <=2.2  LA volume, S                             68.3  ml       ----------  LA volume/bsa, S                         37.9  ml/m^2   ----------  LA volume, ES, 1-p A4C                   66.6  ml       ----------  LA volume/bsa, ES, 1-p A4C               37    ml/m^2   ----------  LA volume, ES, 1-p A2C                   64    ml       ----------  LA volume/bsa, ES, 1-p A2C               35.6  ml/m^2   ----------    Mitral valve                             Value          Reference  Mitral E-wave peak velocity              177   cm/s     ----------  Mitral A-wave peak velocity              48.2  cm/s     ----------  Mitral mean velocity, D                  82.4  cm/s     ----------  Mitral deceleration time         (H)     275   ms  150 - 230  Mitral pressure half-time                115   ms       ----------  Mitral mean gradient, D                  4     mm Hg    ----------  Mitral peak gradient, D                  13    mm Hg    ----------  Mitral E/A ratio, peak                   3.7            ----------  Mitral valve area, PHT, DP               1.91  cm^2     ----------  Mitral valve area/bsa, PHT, DP           1.06  cm^2/m^2 ----------  Mitral annulus VTI, D                    51.6  cm       ----------    Pulmonary arteries                       Value          Reference  PA pressure, S, DP               (H)     43    mm Hg    <=30    Tricuspid valve                          Value          Reference  Tricuspid regurg peak velocity           316   cm/s     ----------  Tricuspid peak RV-RA gradient            40    mm Hg    ----------    Right atrium                             Value          Reference  RA ID, S-I, ES, A4C              (H)     53.9  mm       34 - 49  RA area,  ES, A4C                 (H)     21.2  cm^2     8.3 - 19.5  RA volume, ES, A/L                       68    ml       ----------  RA volume/bsa, ES, A/L                   37.8  ml/m^2   ----------    Systemic veins                           Value  Reference  Estimated CVP                            3     mm Hg    ----------    Right ventricle                          Value          Reference  TAPSE                                    19.4  mm       ----------  RV pressure, S, DP               (H)     43    mm Hg    <=30  Legend: (L)  and  (H)  mark values outside specified reference range.  ------------------------------------------------------------------- Prepared and Electronically Authenticated by  Kate Sable, MD 2018-07-06T12:29:07    2 channel telemetry rhythm strip performed in our office today demonstrates sinus rhythm   Impression:  Patient is doing well approximately 4 months status post minimally invasive mitral valve repair and Maze procedure. She is maintaining sinus rhythm. Follow-up echocardiogram looked good was intact mitral valve repair and normal left ventricular systolic function.  Plan:  We have not recommended any changes to the patient's current medications. The patient has been encouraged to continue to increase her activity without any particular limitations. She has been cautioned to remain careful when she is out in the very hot weather.  The patient has been reminded regarding the importance of dental hygiene and the lifelong need for antibiotic prophylaxis for all dental cleanings and other related invasive procedures.   I spent in excess of 15 minutes during the conduct of this office consultation and >50% of this time involved direct face-to-face encounter with the patient for counseling and/or coordination of their care.    Valentina Gu. Roxy Manns, MD 11/05/2016 1:41 PM

## 2016-11-07 ENCOUNTER — Ambulatory Visit: Payer: Self-pay | Admitting: Physician Assistant

## 2016-11-07 ENCOUNTER — Encounter (HOSPITAL_COMMUNITY)
Admission: RE | Admit: 2016-11-07 | Discharge: 2016-11-07 | Disposition: A | Discharge status: Home / Self Care | Payer: Medicare Other | Source: Ambulatory Visit | Attending: Cardiology | Admitting: Cardiology

## 2016-11-07 DIAGNOSIS — I5030 Unspecified diastolic (congestive) heart failure: Secondary | ICD-10-CM | POA: Diagnosis not present

## 2016-11-07 DIAGNOSIS — I5032 Chronic diastolic (congestive) heart failure: Secondary | ICD-10-CM | POA: Diagnosis not present

## 2016-11-07 DIAGNOSIS — D509 Iron deficiency anemia, unspecified: Secondary | ICD-10-CM | POA: Diagnosis not present

## 2016-11-07 DIAGNOSIS — Z9889 Other specified postprocedural states: Secondary | ICD-10-CM | POA: Diagnosis not present

## 2016-11-07 DIAGNOSIS — Z7901 Long term (current) use of anticoagulants: Secondary | ICD-10-CM | POA: Diagnosis not present

## 2016-11-07 DIAGNOSIS — J45909 Unspecified asthma, uncomplicated: Secondary | ICD-10-CM | POA: Diagnosis not present

## 2016-11-07 DIAGNOSIS — J449 Chronic obstructive pulmonary disease, unspecified: Secondary | ICD-10-CM | POA: Diagnosis not present

## 2016-11-07 DIAGNOSIS — I10 Essential (primary) hypertension: Secondary | ICD-10-CM | POA: Diagnosis not present

## 2016-11-07 DIAGNOSIS — K219 Gastro-esophageal reflux disease without esophagitis: Secondary | ICD-10-CM | POA: Diagnosis not present

## 2016-11-07 DIAGNOSIS — Z86718 Personal history of other venous thrombosis and embolism: Secondary | ICD-10-CM | POA: Diagnosis not present

## 2016-11-07 DIAGNOSIS — N183 Chronic kidney disease, stage 3 (moderate): Secondary | ICD-10-CM | POA: Diagnosis not present

## 2016-11-07 NOTE — Progress Notes (Signed)
Daily Session Note  Patient Details  Name: [name] [name] MRN: [id number] Date of Birth: [birth year] Referring Provider:     CARDIAC REHAB PHASE II ORIENTATION from 09/18/2016 in [place]  Referring Provider  Dr. [name]      Encounter Date: 11/07/2016  Check In:     Session Check In - 11/07/16 0813      Check-In   Location [facility]   Staff Present Russella Dar, MS, EP, [facility], Exercise Physiologist;Nataliee Shurtz Luther Parody, BS, EP, Exercise Physiologist   Supervising physician immediately available to respond to emergencies See telemetry face sheet for immediately available MD   Medication changes reported     No   Fall or balance concerns reported    No   Warm-up and Cool-down Performed as group-led instruction   Resistance Training Performed Yes   VAD Patient? No     Pain Assessment   Currently in Pain? No/denies   Pain Score 0-No pain   Multiple Pain Sites No      Capillary Blood Glucose: No results found for this or any previous visit (from the past 24 hour(s)).    History  Smoking Status  . Never Smoker  Smokeless Tobacco  . Never Used    Goals Met:  Independence with exercise equipment Exercise tolerated well No report of cardiac concerns or symptoms Strength training completed today  Goals Unmet:  Not Applicable  Comments: Check out 915   Dr. Kate Sable is Medical Director for [place] and Pulmonary Rehab.

## 2016-11-08 ENCOUNTER — Encounter: Payer: Self-pay | Admitting: Adult Health

## 2016-11-08 ENCOUNTER — Encounter: Payer: Self-pay | Admitting: *Deleted

## 2016-11-08 ENCOUNTER — Ambulatory Visit (INDEPENDENT_AMBULATORY_CARE_PROVIDER_SITE_OTHER): Payer: Medicare Other | Admitting: Adult Health

## 2016-11-08 ENCOUNTER — Ambulatory Visit: Payer: Self-pay | Admitting: Cardiology

## 2016-11-08 VITALS — BP 140/74 | HR 68 | Ht 62.0 in | Wt 153.0 lb

## 2016-11-08 DIAGNOSIS — I4819 Other persistent atrial fibrillation: Secondary | ICD-10-CM

## 2016-11-08 DIAGNOSIS — I5033 Acute on chronic diastolic (congestive) heart failure: Secondary | ICD-10-CM | POA: Diagnosis not present

## 2016-11-08 DIAGNOSIS — Z9889 Other specified postprocedural states: Secondary | ICD-10-CM

## 2016-11-08 DIAGNOSIS — I481 Persistent atrial fibrillation: Secondary | ICD-10-CM

## 2016-11-08 MED ORDER — WARFARIN SODIUM 2.5 MG PO TABS: ORAL_TABLET | ORAL | 3 refills | Status: DC

## 2016-11-08 NOTE — Patient Instructions (Signed)
Your physician recommends that you schedule a follow-up appointment with Dr. Branch  Your physician recommends that you continue on your current medications as directed. Please refer to the Current Medication list given to you today.  If you need a refill on your cardiac medications before your next appointment, please call your pharmacy.  Thank you for choosing [facility]!    

## 2016-11-08 NOTE — Progress Notes (Signed)
Cardiology Office Note   Date:  11/08/2016   ID:  [name] [name], DOB 1941-02-13, MRN [id number]  PCP:  Glenda Chroman, MD  Cardiologist:  [facility] Chief Complaint  Patient presents with  . Cardiac Valve Problem    s/p MV repair      History of Present Illness: [name] [name] is a 76 y.o. female who presents for ongoing assessment management of mitral valve regurgitation with severe MR and severe TR, status post mitral valve repair March 2018 using a Sorin Memo 3-D ring angioplasty (size 30 mm catalog #[id number], serial [id number]), paroxysmal fibrillation, CHF, hypertension.  Due to recent issues with weight gain and fluid retention echocardiogram was repeated 10/26/2016 Left ventricle: The cavity size was normal. Wall thickness was   normal. Systolic function was normal. The estimated ejection   fraction was in the range of 60% to 65%. Wall motion was normal;   there were no regional wall motion abnormalities. The study is   not technically sufficient to allow evaluation of LV diastolic   function. - Aortic valve: Moderately calcified annulus. Trileaflet. - Mitral valve: Status post complex valvuloplasty including   artificial Gore-tex neochord placement and Sorin Memo 3D Ring   Annuloplasty (size 27mm, catalog #[id number], serial [id number]). There   was trivial regurgitation. Mean gradient (D): 4 mm Hg. - Left atrium: The atrium was mildly dilated. - Right ventricle: The cavity size was mildly dilated. Systolic   function appears mildly reduced. - Right atrium: The atrium was mildly to moderately dilated. - Tricuspid valve: There was moderate regurgitation. - Pulmonary arteries: PA peak pressure: 43 mm Hg (S).  The patient was given extra doses of Lasix, with weight of 159 pounds on 10/25/2016 , a follow-up appointment with Dr. Ricard Dillon on 11/05/2016 with a weight of 152 pounds. She is feeling much better. She still has mild dependent edema in her feet. She saw pulmonologist yesterday, and  is now on Neb tx at home. She saw her PCP this month, and has had labs completed.  She sees Edrick Oh RN in [place] office coumadin clinic. Last INR on 10/30/2016 was. 4.0.   Past Medical History:  Diagnosis Date  . Anxiety   . Arthritis   . Asthma   . Atrial fibrillation, persistent ([place])   . Chronic diastolic congestive heart failure ([place])   . Colon adenoma   . Colon cancer ([place])    status post low anterior resection, limited stage disease requiring no adjuvant therapy  . COPD (chronic obstructive pulmonary disease) ([place])   . Depression   . Diverticulosis   . DM (dermatomyositis)   . DVT (deep venous thrombosis) ([name])    in leg- long time ago  . Dyspnea    with activity  . Dysrhythmia   . Esophageal dysphagia   . GERD (gastroesophageal reflux disease)   . Headache   . Heart murmur   . Hematuria   . Hemorrhoids   . Hiatal hernia   . History of kidney stones    x 2  . Hypercholesterolemia   . Incidental pulmonary nodule 05/08/2016   8 mm vague opacity RML noted on CT scan  . Mitral regurgitation   . PONV (postoperative nausea and vomiting) 2003 ish    with breast biopsy  . S/P minimally invasive maze operation for atrial fibrillation 07/05/2016   Complete bilateral atrial lesion set using cryothermy and bipolar radiofrequency ablation with clipping of LA appendage via right mini thoracotomy approach  . S/P minimally invasive  mitral valve repair 07/05/2016   Complex valvuloplasty including artificial Gore-tex neochord placement x6 and 30 mm Sorin Memo 3D ring annuloplasty via right mini thoracotomy approach  . Schatzki's ring   . Tricuspid regurgitation     Past Surgical History:  Procedure Laterality Date  . APPENDECTOMY    . BREAST SURGERY Right 2003ish   biopsy  . CARDIAC CATHETERIZATION N/A 04/12/2016   Procedure: Right/Left Heart Cath and Coronary Angiography;  Surgeon: Leonie Man, MD;  Location: [facility];  Service: Cardiovascular;  Laterality: N/A;  .  CATARACT EXTRACTION Bilateral 2017  . CLIPPING OF ATRIAL APPENDAGE  07/05/2016   Procedure: CLIPPING OF ATRIAL APPENDAGE;  Surgeon: Rexene Alberts, MD;  Location: [facility];  Service: Open Heart Surgery;;  . COLONOSCOPY  11/09   Dr. Gala Romney- external hemorrhoidal tags o/w normal rectal mucosa, s/p surgical resection with a normal appearing anastomosis 12cm, pan colonic diverticulum  . COLONOSCOPY N/A 06/02/2012   RXV:QMGQQP post low anterior resection. Pancolonic diverticulosis. Colonic polyp-tubular adenoma. Surveillance due 2019.   Marland Kitchen COLONOSCOPY N/A 10/13/2015   Procedure: COLONOSCOPY;  Surgeon: Daneil Dolin, MD;  [name];  Service: Endoscopy;  Laterality: N/A;  0930  . ESOPHAGOGASTRODUODENOSCOPY  07/2007   Dr. Daiva Nakayama cervical esophageal web, schatzki ring, large hiatal hernia  . INGUNAL HERNIA REPAIR Left   . LOW ANTERIOR BOWEL RESECTION     NO ADJ CHEMO  . MINIMALLY INVASIVE MAZE PROCEDURE N/A 07/05/2016   Procedure: MINIMALLY INVASIVE MAZE PROCEDURE;  Surgeon: Rexene Alberts, MD;  Location: [place];  Service: Open Heart Surgery;  Laterality: N/A;  . MITRAL VALVE REPAIR Right 07/05/2016   Procedure: MINIMALLY INVASIVE MITRAL VALVE REPAIR (MVR);  Surgeon: Rexene Alberts, MD;  Location: [place] [facility];  Service: Open Heart Surgery;  Laterality: Right;  . MULTIPLE EXTRACTIONS WITH ALVEOLOPLASTY N/A 05/21/2016   Procedure: MULTIPLE EXTRACTION OF TOOTH #'S 5, 21 WITH ALVEOLOPLASTY AND GROSS DEBRIDEMENT OF TEETH;  Surgeon: Lenn Cal, DDS;  Location: [place];  Service: Oral Surgery;  Laterality: N/A;  . TEE WITHOUT CARDIOVERSION N/A 02/20/2016   Procedure: TRANSESOPHAGEAL ECHOCARDIOGRAM (TEE);  Surgeon: Arnoldo Lenis, MD;  [name];  Service: Endoscopy;  Laterality: N/A;  . TEE WITHOUT CARDIOVERSION N/A 07/05/2016   Procedure: TRANSESOPHAGEAL ECHOCARDIOGRAM (TEE);  Surgeon: Rexene Alberts, MD;  Location: [facility];  Service: Open Heart Surgery;  Laterality: N/A;  .  TOOTH EXTRACTION    . TUBAL LIGATION    . VEIN LIGATION AND STRIPPING       Current Outpatient Prescriptions  Medication Sig Dispense Refill  . acetaminophen (TYLENOL) 325 MG tablet Take 650 mg by mouth every 6 (six) hours as needed for pain.    Marland Kitchen albuterol (PROVENTIL HFA;VENTOLIN HFA) 108 (90 BASE) MCG/ACT inhaler Inhale 2 puffs into the lungs every 6 (six) hours as needed for wheezing.    Marland Kitchen ALPRAZolam (XANAX) 0.5 MG tablet Take 0.5 mg by mouth 2 (two) times daily as needed (for anxiety/sleep (scheduled at bedtime)).     Marland Kitchen aspirin EC 81 MG EC tablet Take 1 tablet (81 mg total) by mouth daily.    . calcium carbonate (OS-CAL) 600 MG TABS tablet Take 600 mg by mouth 2 (two) times daily with a meal.    . Coenzyme Q10 100 MG TABS Take 100 mg by mouth every evening.     . docusate sodium (COLACE) 100 MG capsule Take 200 mg by mouth daily as needed for mild  constipation.     . furosemide (LASIX) 40 MG tablet Take 1 tablet (40 mg total) by mouth 2 (two) times daily. 30 tablet 1  . hydrocortisone (ANUSOL-HC) 2.5 % rectal cream Place 1 application rectally 2 (two) times daily. FOR UP TO 10 DAYS AT A TIME (Patient taking differently: Place 1 application rectally 2 (two) times daily as needed for hemorrhoids or itching. FOR UP TO 10 DAYS AT A TIME) 30 g 1  . metoprolol succinate (TOPROL-XL) 25 MG 24 hr tablet Take 1 tablet (25 mg total) by mouth 2 (two) times daily. 60 tablet 3  . Multiple Vitamin (MULTIVITAMIN) tablet Take 1 tablet by mouth daily.      Marland Kitchen omeprazole (PRILOSEC) 20 MG capsule Take 1 capsule (20 mg total) by mouth daily. 30 capsule 3  . PARoxetine (PAXIL) 20 MG tablet Take 20 mg by mouth daily.     Vladimir Faster Glycol-Propyl Glycol (SYSTANE ULTRA) 0.4-0.3 % SOLN Place 1 drop into both eyes 3 (three) times daily.    . potassium chloride SA (K-DUR,KLOR-CON) 20 MEQ tablet Take 20 mEq by mouth 2 (two) times daily.    . sodium chloride (OCEAN) 0.65 % SOLN nasal spray Place 1 spray into both  nostrils as needed for congestion.    . traMADol (ULTRAM) 50 MG tablet Take 50 mg by mouth every 4-6 hours PRN severe pain. 30 tablet 0  . umeclidinium-vilanterol (ANORO ELLIPTA) 62.5-25 MCG/INH AEPB Inhale 1 puff into the lungs daily. 30 each 6  . warfarin (COUMADIN) 2.5 MG tablet Take 1 tablet daily except 1 1/2 tablets on Sundays, Tuesdays and Thursdays 36 tablet 3   No current facility-administered medications for this visit.     Allergies:   Iohexol; Hydrocodone-acetaminophen; Nabumetone; and Prednisone    Social History:  The patient  reports that she has never smoked. She has never used smokeless tobacco. She reports that she does not drink alcohol or use drugs.   Family History:  The patient's family history includes Aneurysm in her brother; Brain cancer in her sister; Breast cancer in her mother; Cancer - Lung in her sister; Prostate cancer in her brother; Rheum arthritis in her father.    ROS: All other systems are reviewed and negative. Unless otherwise mentioned in H&P    PHYSICAL EXAM: VS:  BP 140/74   Pulse 68   Ht 5\' 2"  (1.575 m)   Wt 153 lb (69.4 kg)   SpO2 98%   BMI 27.98 kg/m  , BMI Body mass index is 27.98 kg/m. GEN: Well nourished, well developed, in no acute distress  HEENT: normal  Neck: no JVD, carotid bruits, or masses Cardiac: IRRR; 2/6 systolic murmur  No  rubs, or gallops, mild non-pitting edema  Respiratory: Mild crackles without wheezing or coughing. I: soft, nontender, nondistended, + BS MS: no deformity or atrophy  Skin: warm and dry, no rash Neuro:  Strength and sensation are intact Psych: euthymic mood, full affect  Recent Labs: 07/03/2016: ALT 19 07/27/2016: BUN 10; Creatinine, Ser 0.80; Hemoglobin 9.9; Magnesium 2.0; Platelets 469; Potassium 3.9; Sodium 137; TSH 3.327    Lipid Panel No results found for: CHOL, TRIG, HDL, CHOLHDL, VLDL, LDLCALC, LDLDIRECT    Wt Readings from Last 3 Encounters:  11/08/16 153 lb (69.4 kg)  11/05/16 152 lb  9.6 oz (69.2 kg)  10/25/16 159 lb (72.1 kg)    ASSESSMENT AND PLAN:  1. Mitral Valve regurgitation: S/P repair in March of 2018. Is doing well post operatively and  will be seeing CVTS annually. Last echo in July demonstrated normal function mitral valve, normal EF, and moderately calcified AoV. Continue current regimen. Continue coumadin therapy, with dosage adjustments per INR.   2. Atrial fib: Heart rate is well controlled currently. No changes in regimen. Continue anticoagulation.   3. COPD: Followed by pulmonology. Now on Neb tx at home.   4. Chronic LEE: Much better with addition of lasix. She is still having some dependent edema. She is asked to watch salt intake.      Current medicines are reviewed at length with the patient today.    Labs/ tests ordered today include: Get labs from Dr. Woody Seller. Phill Myron. [place] Pugh, ANP, AACC   11/08/2016 1:01 PM    [facility] [address] [place], [place] [address] Phone: [phone]; Fax: [phone]

## 2016-11-09 ENCOUNTER — Encounter (HOSPITAL_COMMUNITY)
Admission: RE | Admit: 2016-11-09 | Discharge: 2016-11-09 | Disposition: A | Discharge status: Home / Self Care | Payer: Medicare Other | Source: Ambulatory Visit | Attending: Cardiology | Admitting: Cardiology

## 2016-11-09 DIAGNOSIS — Z9889 Other specified postprocedural states: Secondary | ICD-10-CM | POA: Diagnosis not present

## 2016-11-09 DIAGNOSIS — K219 Gastro-esophageal reflux disease without esophagitis: Secondary | ICD-10-CM | POA: Diagnosis not present

## 2016-11-09 DIAGNOSIS — Z86718 Personal history of other venous thrombosis and embolism: Secondary | ICD-10-CM | POA: Diagnosis not present

## 2016-11-09 DIAGNOSIS — I5032 Chronic diastolic (congestive) heart failure: Secondary | ICD-10-CM | POA: Diagnosis not present

## 2016-11-09 DIAGNOSIS — Z7901 Long term (current) use of anticoagulants: Secondary | ICD-10-CM | POA: Diagnosis not present

## 2016-11-09 NOTE — Progress Notes (Signed)
Daily Session Note  Patient Details  Name: SANYIAH KANZLER MRN: [id number] Date of Birth: 1940/09/18 Referring Provider:     CARDIAC REHAB PHASE II ORIENTATION from 09/18/2016 in [place]  Referring Provider  Dr. [name]      Encounter Date: 11/09/2016  Check In:     Session Check In - 11/09/16 0831      Check-In   Location [facility]   Staff Present Russella Dar, MS, EP, [facility], Exercise Physiologist;Geno Sydnor Luther Parody, BS, EP, Exercise Physiologist   Supervising physician immediately available to respond to emergencies See telemetry face sheet for immediately available MD   Medication changes reported     No   Fall or balance concerns reported    No   Warm-up and Cool-down Performed as group-led instruction   Resistance Training Performed Yes   VAD Patient? No     Pain Assessment   Currently in Pain? No/denies   Pain Score 0-No pain   Multiple Pain Sites No      Capillary Blood Glucose: No results found for this or any previous visit (from the past 24 hour(s)).    History  Smoking Status  . Never Smoker  Smokeless Tobacco  . Never Used    Goals Met:  Independence with exercise equipment Exercise tolerated well No report of cardiac concerns or symptoms Strength training completed today  Goals Unmet:  Not Applicable  Comments: Check out 915   Dr. Kate Sable is Medical Director for [facility] and Pulmonary Rehab.

## 2016-11-12 ENCOUNTER — Encounter (HOSPITAL_COMMUNITY)
Admission: RE | Admit: 2016-11-12 | Discharge: 2016-11-12 | Disposition: A | Discharge status: Home / Self Care | Payer: Medicare Other | Source: Ambulatory Visit | Attending: Cardiology | Admitting: Cardiology

## 2016-11-12 DIAGNOSIS — Z86718 Personal history of other venous thrombosis and embolism: Secondary | ICD-10-CM | POA: Diagnosis not present

## 2016-11-12 DIAGNOSIS — Z9889 Other specified postprocedural states: Secondary | ICD-10-CM

## 2016-11-12 DIAGNOSIS — Z7901 Long term (current) use of anticoagulants: Secondary | ICD-10-CM | POA: Diagnosis not present

## 2016-11-12 DIAGNOSIS — I5032 Chronic diastolic (congestive) heart failure: Secondary | ICD-10-CM | POA: Diagnosis not present

## 2016-11-12 DIAGNOSIS — K219 Gastro-esophageal reflux disease without esophagitis: Secondary | ICD-10-CM | POA: Diagnosis not present

## 2016-11-12 NOTE — Progress Notes (Signed)
Daily Session Note  Patient Details  Name: [name] [name] MRN: [id number] Date of Birth: Mar 12, 1941 Referring Provider:     CARDIAC REHAB PHASE II ORIENTATION from 09/18/2016 in [place]  Referring Provider  Dr. [name]      Encounter Date: 11/12/2016  Check In:     Session Check In - 11/12/16 0824      Check-In   Location [facility]   Staff Present Russella Dar, MS, EP, [facility], Exercise Physiologist;Paisli Silfies Luther Parody, BS, EP, Exercise Physiologist   Supervising physician immediately available to respond to emergencies See telemetry face sheet for immediately available MD   Medication changes reported     No   Fall or balance concerns reported    No   Warm-up and Cool-down Performed as group-led instruction   Resistance Training Performed Yes   VAD Patient? No     Pain Assessment   Currently in Pain? No/denies   Pain Score 0-No pain   Multiple Pain Sites No      Capillary Blood Glucose: No results found for this or any previous visit (from the past 24 hour(s)).    History  Smoking Status  . Never Smoker  Smokeless Tobacco  . Never Used    Goals Met:  Independence with exercise equipment Exercise tolerated well No report of cardiac concerns or symptoms Strength training completed today  Goals Unmet:  Not Applicable  Comments: Check out 915   Dr. Kate Sable is Medical Director for [facility] and Pulmonary Rehab.

## 2016-11-13 ENCOUNTER — Ambulatory Visit (INDEPENDENT_AMBULATORY_CARE_PROVIDER_SITE_OTHER): Payer: Medicare Other | Admitting: *Deleted

## 2016-11-13 DIAGNOSIS — Z5181 Encounter for therapeutic drug level monitoring: Secondary | ICD-10-CM

## 2016-11-13 DIAGNOSIS — Z9889 Other specified postprocedural states: Secondary | ICD-10-CM

## 2016-11-13 DIAGNOSIS — I481 Persistent atrial fibrillation: Secondary | ICD-10-CM | POA: Diagnosis not present

## 2016-11-13 DIAGNOSIS — I4819 Other persistent atrial fibrillation: Secondary | ICD-10-CM

## 2016-11-13 LAB — POCT INR: INR: 1.9

## 2016-11-14 ENCOUNTER — Encounter (HOSPITAL_COMMUNITY)
Admission: RE | Admit: 2016-11-14 | Discharge: 2016-11-14 | Disposition: A | Discharge status: Home / Self Care | Payer: Medicare Other | Source: Ambulatory Visit | Attending: Cardiology | Admitting: Cardiology

## 2016-11-14 DIAGNOSIS — K219 Gastro-esophageal reflux disease without esophagitis: Secondary | ICD-10-CM | POA: Diagnosis not present

## 2016-11-14 DIAGNOSIS — D509 Iron deficiency anemia, unspecified: Secondary | ICD-10-CM | POA: Diagnosis not present

## 2016-11-14 DIAGNOSIS — N183 Chronic kidney disease, stage 3 (moderate): Secondary | ICD-10-CM | POA: Diagnosis not present

## 2016-11-14 DIAGNOSIS — I5032 Chronic diastolic (congestive) heart failure: Secondary | ICD-10-CM | POA: Diagnosis not present

## 2016-11-14 DIAGNOSIS — Z86718 Personal history of other venous thrombosis and embolism: Secondary | ICD-10-CM | POA: Diagnosis not present

## 2016-11-14 DIAGNOSIS — Z7901 Long term (current) use of anticoagulants: Secondary | ICD-10-CM | POA: Diagnosis not present

## 2016-11-14 DIAGNOSIS — Z9889 Other specified postprocedural states: Secondary | ICD-10-CM | POA: Diagnosis not present

## 2016-11-14 NOTE — Progress Notes (Signed)
Daily Session Note  Patient Details  Name: [name] [name] MRN: [id number] Date of Birth: 1940/07/11 Referring Provider:     CARDIAC REHAB PHASE II ORIENTATION from 09/18/2016 in [facility]  Referring Provider  Dr. [name]      Encounter Date: 11/14/2016  Check In:     Session Check In - 11/14/16 0815      Check-In   Location [facility]   Staff Present Russella Dar, MS, EP, [facility], Exercise Physiologist;Gregory Luther Parody, BS, EP, Exercise Physiologist   Supervising physician immediately available to respond to emergencies See telemetry face sheet for immediately available MD   Medication changes reported     No   Fall or balance concerns reported    No   Tobacco Cessation No Change   Warm-up and Cool-down Performed as group-led instruction   Resistance Training Performed Yes   VAD Patient? No     Pain Assessment   Currently in Pain? No/denies   Pain Score 0-No pain   Multiple Pain Sites No      Capillary Blood Glucose: Results for orders placed or performed in visit on 11/13/16 (from the past 24 hour(s))  POCT INR     Status: Abnormal   Collection Time: 11/13/16  8:58 AM  Result Value Ref Range   INR 1.9       History  Smoking Status  . Never Smoker  Smokeless Tobacco  . Never Used    Goals Met:  Independence with exercise equipment Exercise tolerated well No report of cardiac concerns or symptoms Strength training completed today  Goals Unmet:  Not Applicable  Comments: Check out: 9:15   Dr. Kate Sable is Medical Director for [place] and Pulmonary Rehab.

## 2016-11-15 DIAGNOSIS — E2839 Other primary ovarian failure: Secondary | ICD-10-CM | POA: Diagnosis not present

## 2016-11-16 ENCOUNTER — Encounter (HOSPITAL_COMMUNITY)
Admission: RE | Admit: 2016-11-16 | Discharge: 2016-11-16 | Disposition: A | Discharge status: Home / Self Care | Payer: Medicare Other | Source: Ambulatory Visit | Attending: Cardiology | Admitting: Cardiology

## 2016-11-16 DIAGNOSIS — K219 Gastro-esophageal reflux disease without esophagitis: Secondary | ICD-10-CM | POA: Diagnosis not present

## 2016-11-16 DIAGNOSIS — Z9889 Other specified postprocedural states: Secondary | ICD-10-CM

## 2016-11-16 DIAGNOSIS — Z7901 Long term (current) use of anticoagulants: Secondary | ICD-10-CM | POA: Diagnosis not present

## 2016-11-16 DIAGNOSIS — I5032 Chronic diastolic (congestive) heart failure: Secondary | ICD-10-CM | POA: Diagnosis not present

## 2016-11-16 DIAGNOSIS — Z86718 Personal history of other venous thrombosis and embolism: Secondary | ICD-10-CM | POA: Diagnosis not present

## 2016-11-16 NOTE — Progress Notes (Signed)
Daily Session Note  Patient Details  Name: KIANDRA SANGUINETTI MRN: [id number] Date of Birth: 02/02/1941 Referring Provider:     CARDIAC REHAB PHASE II ORIENTATION from 09/18/2016 in [place]  Referring Provider  Dr. [name]      Encounter Date: 11/16/2016  Check In:     Session Check In - 11/16/16 0825      Check-In   Location [facility]   Staff Present Russella Dar, MS, EP, [facility], Exercise Physiologist;Alric Geise Luther Parody, BS, EP, Exercise Physiologist   Supervising physician immediately available to respond to emergencies See telemetry face sheet for immediately available MD   Medication changes reported     No   Fall or balance concerns reported    No   Warm-up and Cool-down Performed as group-led instruction   Resistance Training Performed Yes   VAD Patient? No     Pain Assessment   Currently in Pain? No/denies   Pain Score 0-No pain   Multiple Pain Sites No      Capillary Blood Glucose: No results found for this or any previous visit (from the past 24 hour(s)).    History  Smoking Status  . Never Smoker  Smokeless Tobacco  . Never Used    Goals Met:  Independence with exercise equipment Exercise tolerated well No report of cardiac concerns or symptoms Strength training completed today  Goals Unmet:  Not Applicable  Comments: Check out 915   Dr. Kate Sable is Medical Director for [place] and Pulmonary Rehab.

## 2016-11-19 ENCOUNTER — Encounter (HOSPITAL_COMMUNITY)
Admission: RE | Admit: 2016-11-19 | Discharge: 2016-11-19 | Disposition: A | Discharge status: Home / Self Care | Payer: Medicare Other | Source: Ambulatory Visit | Attending: Cardiology | Admitting: Cardiology

## 2016-11-19 DIAGNOSIS — Z9889 Other specified postprocedural states: Secondary | ICD-10-CM | POA: Diagnosis not present

## 2016-11-19 DIAGNOSIS — Z86718 Personal history of other venous thrombosis and embolism: Secondary | ICD-10-CM | POA: Diagnosis not present

## 2016-11-19 DIAGNOSIS — K219 Gastro-esophageal reflux disease without esophagitis: Secondary | ICD-10-CM | POA: Diagnosis not present

## 2016-11-19 DIAGNOSIS — I5032 Chronic diastolic (congestive) heart failure: Secondary | ICD-10-CM | POA: Diagnosis not present

## 2016-11-19 DIAGNOSIS — Z7901 Long term (current) use of anticoagulants: Secondary | ICD-10-CM | POA: Diagnosis not present

## 2016-11-19 NOTE — Progress Notes (Signed)
Daily Session Note  Patient Details  Name: [name] [name] MRN: [id number] Date of Birth: [birth year] Referring Provider:     CARDIAC REHAB PHASE II ORIENTATION from 09/18/2016 in [place]  Referring Provider  Dr. [name]      Encounter Date: 11/19/2016  Check In:     Session Check In - 11/19/16 0815      Check-In   Location [facility]   Staff Present Russella Dar, MS, EP, [facility], Exercise Physiologist;Jasper Ruminski Wynetta Emery, RN, BSN   Supervising physician immediately available to respond to emergencies See telemetry face sheet for immediately available MD   Medication changes reported     No   Fall or balance concerns reported    No   Tobacco Cessation No Change   Warm-up and Cool-down Performed as group-led instruction   Resistance Training Performed Yes   VAD Patient? No     Pain Assessment   Currently in Pain? No/denies   Pain Score 0-No pain   Multiple Pain Sites No      Capillary Blood Glucose: No results found for this or any previous visit (from the past 24 hour(s)).    History  Smoking Status  . Never Smoker  Smokeless Tobacco  . Never Used    Goals Met:  Independence with exercise equipment Exercise tolerated well No report of cardiac concerns or symptoms Strength training completed today  Goals Unmet:  Not Applicable  Comments: Check out 915.   Dr. Kate Sable is Medical Director for [facility] Cardiac and Pulmonary Rehab.

## 2016-11-21 ENCOUNTER — Ambulatory Visit (INDEPENDENT_AMBULATORY_CARE_PROVIDER_SITE_OTHER): Payer: Medicare Other | Admitting: *Deleted

## 2016-11-21 ENCOUNTER — Encounter (HOSPITAL_COMMUNITY)
Admission: RE | Admit: 2016-11-21 | Discharge: 2016-11-21 | Disposition: A | Discharge status: Home / Self Care | Payer: Medicare Other | Source: Ambulatory Visit | Attending: Cardiology | Admitting: Cardiology

## 2016-11-21 DIAGNOSIS — Z5181 Encounter for therapeutic drug level monitoring: Secondary | ICD-10-CM

## 2016-11-21 DIAGNOSIS — I481 Persistent atrial fibrillation: Secondary | ICD-10-CM | POA: Diagnosis not present

## 2016-11-21 DIAGNOSIS — Z9889 Other specified postprocedural states: Secondary | ICD-10-CM

## 2016-11-21 DIAGNOSIS — K219 Gastro-esophageal reflux disease without esophagitis: Secondary | ICD-10-CM | POA: Insufficient documentation

## 2016-11-21 DIAGNOSIS — Z7901 Long term (current) use of anticoagulants: Secondary | ICD-10-CM | POA: Diagnosis not present

## 2016-11-21 DIAGNOSIS — Z86718 Personal history of other venous thrombosis and embolism: Secondary | ICD-10-CM | POA: Insufficient documentation

## 2016-11-21 DIAGNOSIS — I4819 Other persistent atrial fibrillation: Secondary | ICD-10-CM

## 2016-11-21 DIAGNOSIS — I5032 Chronic diastolic (congestive) heart failure: Secondary | ICD-10-CM | POA: Diagnosis not present

## 2016-11-21 LAB — POCT INR: INR: 1.5

## 2016-11-21 NOTE — Progress Notes (Signed)
Daily Session Note  Patient Details  Name: [name] [name] MRN: [id number] Date of Birth: 11-13-1940 Referring Provider:     CARDIAC REHAB PHASE II ORIENTATION from 09/18/2016 in [place]  Referring Provider  Dr. [name]      Encounter Date: 11/21/2016  Check In:     Session Check In - 11/21/16 0815      Check-In   Location [facility]   Staff Present Russella Dar, MS, EP, [facility], Exercise Physiologist;Ameir Faria Wynetta Emery, RN, BSN   Supervising physician immediately available to respond to emergencies See telemetry face sheet for immediately available MD   Medication changes reported     No   Fall or balance concerns reported    No   Tobacco Cessation No Change   Warm-up and Cool-down Performed as group-led instruction   Resistance Training Performed Yes   VAD Patient? No     Pain Assessment   Currently in Pain? No/denies   Pain Score 0-No pain   Multiple Pain Sites No      Capillary Blood Glucose: Results for orders placed or performed in visit on 11/21/16 (from the past 24 hour(s))  POCT INR     Status: Abnormal   Collection Time: 11/21/16  7:50 AM  Result Value Ref Range   INR 1.5       History  Smoking Status  . Never Smoker  Smokeless Tobacco  . Never Used    Goals Met:  Independence with exercise equipment Exercise tolerated well No report of cardiac concerns or symptoms Strength training completed today  Goals Unmet:  Not Applicable  Comments: Check out 915.   Dr. Kate Sable is Medical Director for [facility] Cardiac and Pulmonary Rehab.

## 2016-11-23 ENCOUNTER — Encounter (HOSPITAL_COMMUNITY)
Admission: RE | Admit: 2016-11-23 | Discharge: 2016-11-23 | Disposition: A | Discharge status: Home / Self Care | Payer: Medicare Other | Source: Ambulatory Visit | Attending: Cardiology | Admitting: Cardiology

## 2016-11-23 DIAGNOSIS — Z9889 Other specified postprocedural states: Secondary | ICD-10-CM | POA: Diagnosis not present

## 2016-11-23 DIAGNOSIS — K219 Gastro-esophageal reflux disease without esophagitis: Secondary | ICD-10-CM | POA: Diagnosis not present

## 2016-11-23 DIAGNOSIS — Z7901 Long term (current) use of anticoagulants: Secondary | ICD-10-CM | POA: Diagnosis not present

## 2016-11-23 DIAGNOSIS — Z86718 Personal history of other venous thrombosis and embolism: Secondary | ICD-10-CM | POA: Diagnosis not present

## 2016-11-23 DIAGNOSIS — I5032 Chronic diastolic (congestive) heart failure: Secondary | ICD-10-CM | POA: Diagnosis not present

## 2016-11-23 NOTE — Progress Notes (Signed)
Daily Session Note  Patient Details  Name: [name] [name] MRN: [id number] Date of Birth: 01/21/1941 Referring Provider:     CARDIAC REHAB PHASE II ORIENTATION from 09/18/2016 in [place] [facility]  Referring Provider  Dr. Branch      Encounter Date: 11/23/2016  Check In:     Session Check In - 11/23/16 0843      Check-In   Location [facility]   Staff Present Meryem Haertel, BS, EP, Exercise Physiologist;Debra Johnson, RN, BSN   Supervising physician immediately available to respond to emergencies See telemetry face sheet for immediately available MD   Medication changes reported     No   Fall or balance concerns reported    No   Warm-up and Cool-down Performed as group-led instruction   Resistance Training Performed Yes   VAD Patient? No     Pain Assessment   Currently in Pain? No/denies   Pain Score 0-No pain   Multiple Pain Sites No      Capillary Blood Glucose: No results found for this or any previous visit (from the past 24 hour(s)).    History  Smoking Status  . Never Smoker  Smokeless Tobacco  . Never Used    Goals Met:  Independence with exercise equipment Exercise tolerated well No report of cardiac concerns or symptoms Strength training completed today  Goals Unmet:  Not Applicable  Comments: Check out 915   Dr. Suresh Koneswaran is Medical Director for [place] [facility] and Pulmonary Rehab. 

## 2016-11-23 NOTE — Progress Notes (Signed)
Cardiac Individual Treatment Plan  Patient Details  Name: [name] [name] MRN: [id number] Date of Birth: 1941-02-09 Referring Provider:     [facility] from 09/18/2016 in [place]  Referring Provider  Dr. [name]      Initial Encounter Date:    CARDIAC REHAB PHASE II ORIENTATION from 09/18/2016 in [place]  Date  09/18/16  Referring Provider  Dr. [name]      Visit Diagnosis: S/P mitral valve repair  Patient's Home Medications on Admission:  Current Outpatient Prescriptions:  .  acetaminophen (TYLENOL) 325 MG tablet, Take 650 mg by mouth every 6 (six) hours as needed for pain., Disp: , Rfl:  .  albuterol (PROVENTIL HFA;VENTOLIN HFA) 108 (90 BASE) MCG/ACT inhaler, Inhale 2 puffs into the lungs every 6 (six) hours as needed for wheezing., Disp: , Rfl:  .  ALPRAZolam (XANAX) 0.5 MG tablet, Take 0.5 mg by mouth 2 (two) times daily as needed (for anxiety/sleep (scheduled at bedtime)). , Disp: , Rfl:  .  aspirin EC 81 MG EC tablet, Take 1 tablet (81 mg total) by mouth daily., Disp: , Rfl:  .  calcium carbonate (OS-CAL) 600 MG TABS tablet, Take 600 mg by mouth 2 (two) times daily with a meal., Disp: , Rfl:  .  Coenzyme Q10 100 MG TABS, Take 100 mg by mouth every evening. , Disp: , Rfl:  .  docusate sodium (COLACE) 100 MG capsule, Take 200 mg by mouth daily as needed for mild constipation. , Disp: , Rfl:  .  furosemide (LASIX) 40 MG tablet, Take 1 tablet (40 mg total) by mouth 2 (two) times daily., Disp: 30 tablet, Rfl: 1 .  hydrocortisone (ANUSOL-HC) 2.5 % rectal cream, Place 1 application rectally 2 (two) times daily. FOR UP TO 10 DAYS AT A TIME (Patient taking differently: Place 1 application rectally 2 (two) times daily as needed for hemorrhoids or itching. FOR UP TO 10 DAYS AT A TIME), Disp: 30 g, Rfl: 1 .  metoprolol succinate (TOPROL-XL) 25 MG 24 hr tablet, Take 1 tablet (25 mg total) by mouth 2 (two) times daily.,  Disp: 60 tablet, Rfl: 3 .  Multiple Vitamin (MULTIVITAMIN) tablet, Take 1 tablet by mouth daily.  , Disp: , Rfl:  .  omeprazole (PRILOSEC) 20 MG capsule, Take 1 capsule (20 mg total) by mouth daily., Disp: 30 capsule, Rfl: 3 .  PARoxetine (PAXIL) 20 MG tablet, Take 20 mg by mouth daily. , Disp: , Rfl:  .  Polyethyl Glycol-Propyl Glycol (SYSTANE ULTRA) 0.4-0.3 % SOLN, Place 1 drop into both eyes 3 (three) times daily., Disp: , Rfl:  .  potassium chloride SA (K-DUR,KLOR-CON) 20 MEQ tablet, Take 20 mEq by mouth 2 (two) times daily., Disp: , Rfl:  .  sodium chloride (OCEAN) 0.65 % SOLN nasal spray, Place 1 spray into both nostrils as needed for congestion., Disp: , Rfl:  .  traMADol (ULTRAM) 50 MG tablet, Take 50 mg by mouth every 4-6 hours PRN severe pain., Disp: 30 tablet, Rfl: 0 .  umeclidinium-vilanterol (ANORO ELLIPTA) 62.5-25 MCG/INH AEPB, Inhale 1 puff into the lungs daily., Disp: 30 each, Rfl: 6 .  warfarin (COUMADIN) 2.5 MG tablet, Take 1 tablet daily except 1 1/2 tablets on Sundays, Tuesdays and Thursdays, Disp: 60 tablet, Rfl: 3  Past Medical History: Past Medical History:  Diagnosis Date  . Anxiety   . Arthritis   . Asthma   . Atrial fibrillation, persistent ([place])   . Chronic  diastolic congestive heart failure ([place])   . Colon adenoma   . Colon cancer ([place])    status post low anterior resection, limited stage disease requiring no adjuvant therapy  . COPD (chronic obstructive pulmonary disease) ([place])   . Depression   . Diverticulosis   . DM (dermatomyositis)   . DVT (deep venous thrombosis) ([name])    in leg- long time ago  . Dyspnea    with activity  . Dysrhythmia   . Esophageal dysphagia   . GERD (gastroesophageal reflux disease)   . Headache   . Heart murmur   . Hematuria   . Hemorrhoids   . Hiatal hernia   . History of kidney stones    x 2  . Hypercholesterolemia   . Incidental pulmonary nodule 05/08/2016   8 mm vague opacity RML noted on CT scan  . Mitral  regurgitation   . PONV (postoperative nausea and vomiting) 2003 ish    with breast biopsy  . S/P minimally invasive maze operation for atrial fibrillation 07/05/2016   Complete bilateral atrial lesion set using cryothermy and bipolar radiofrequency ablation with clipping of LA appendage via right mini thoracotomy approach  . 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 Schatzki's ring   . Tricuspid regurgitation     Tobacco Use: History  Smoking Status  . Never Smoker  Smokeless Tobacco  . Never Used    Labs: Recent Review Flowsheet Data    Labs for ITP Cardiac and Pulmonary Rehab Latest Ref Rng & Units 07/05/2016 07/06/2016 07/06/2016 07/06/2016 07/06/2016   Hemoglobin A1c 4.8 - 5.6 % - - - - -   PHART 7.350 - 7.450 7.278(L) 7.288(L) 7.246(L) 7.330(L) -   PCO2ART 32.0 - 48.0 mmHg 54.1(H) 55.1(H) 63.4(H) 46.7 -   HCO3 20.0 - 28.0 mmol/L 25.4 26.4 26.1 24.9 -   TCO2 0 - 100 mmol/L '27 28 28 26 27   '$ ACIDBASEDEF 0.0 - 2.0 mmol/L 2.0 1.0 - 1.0 -   O2SAT % 96.0 98.0 98.0 97.0 -      Capillary Blood Glucose: Lab Results  Component Value Date   GLUCAP 119 (H) 07/09/2016   GLUCAP 110 (H) 07/08/2016   GLUCAP 98 07/08/2016   GLUCAP 97 07/08/2016   GLUCAP 109 (H) 07/08/2016     Exercise Target Goals:    Exercise Program Goal: Individual exercise prescription set with THRR, safety & activity barriers. Participant demonstrates ability to understand and report RPE using BORG scale, to self-measure pulse accurately, and to acknowledge the importance of the exercise prescription.  Exercise Prescription Goal: Starting with aerobic activity 30 plus minutes a day, 3 days per week for initial exercise prescription. Provide home exercise prescription and guidelines that participant acknowledges understanding prior to  discharge.  Activity Barriers & Risk Stratification:   6 Minute Walk:     6 Minute Walk    Row Name 09/18/16 1432         6 Minute Walk   Phase Initial     Distance 1200 feet     Distance % Change 0 %     Walk Time 6 minutes     # of Rest Breaks 0     MPH 2.27     METS 2.74     RPE 11     Perceived Dyspnea  13     VO2  Peak 9.04     Symptoms No     Resting HR 74 bpm     Resting BP 122/64     Max Ex. HR 114 bpm     Max Ex. BP 146/70     2 Minute Post BP 130/60        Oxygen Initial Assessment:   Oxygen Re-Evaluation:   Oxygen Discharge (Final Oxygen Re-Evaluation):   Initial Exercise Prescription:     Initial Exercise Prescription - 09/18/16 1400      Date of Initial Exercise RX and Referring Provider   Date 09/18/16   Referring Provider Dr. [name]     Treadmill   MPH 1.3   Grade 0   Minutes 15   METs 1.9     NuStep   Level 2   SPM 10   Minutes 20   METs 1.7     Prescription Details   Frequency (times per week) 3   Duration Progress to 30 minutes of continuous aerobic without signs/symptoms of physical distress     Intensity   THRR 40-80% of Max Heartrate 102-116-130   Ratings of Perceived Exertion 11-13   Perceived Dyspnea 0-4     Progression   Progression Continue progressive overload as per policy without signs/symptoms or physical distress.     Resistance Training   Training Prescription Yes   Weight 1   Reps 10-15      Perform Capillary Blood Glucose checks as needed.  Exercise Prescription Changes:      Exercise Prescription Changes    Row Name 09/24/16 1400 10/04/16 1500 10/23/16 0700 11/12/16 1400       Response to Exercise   Blood Pressure (Admit) 128/66 120/66 132/62 138/62    Blood Pressure (Exercise) 126/68 136/68 138/70 146/72    Blood Pressure (Exit) 126/64 126/64 148/70 134/66    Heart Rate (Admit) 89 bpm 70 bpm 51 bpm 64 bpm    Heart Rate (Exercise) 88 bpm 76 bpm 65 bpm 77 bpm    Heart Rate (Exit) 87 bpm  76 bpm 60 bpm 72 bpm    Rating of Perceived Exertion (Exercise) '13 11 11 11    '$ Duration Progress to 30 minutes of  aerobic without signs/symptoms of physical distress Progress to 30 minutes of  aerobic without signs/symptoms of physical distress Progress to 30 minutes of  aerobic without signs/symptoms of physical distress Progress to 30 minutes of  aerobic without signs/symptoms of physical distress    Intensity THRR unchanged THRR unchanged THRR unchanged THRR unchanged      Progression   Progression Continue to progress workloads to maintain intensity without signs/symptoms of physical distress. Continue to progress workloads to maintain intensity without signs/symptoms of physical distress. Continue to progress workloads to maintain intensity without signs/symptoms of physical distress. Continue to progress workloads to maintain intensity without signs/symptoms of physical distress.      Resistance Training   Training Prescription Yes Yes Yes Yes    Weight '1 2 2 2    '$ Reps 10-15 10-15 10-15 10-15      Treadmill   MPH 1.3 1.4 1.4 1.4    Grade 0 0 0 0    Minutes '15 15 15 15    '$ METs 1.'9 2 2 2      '$ NuStep   Level '2 2 3 3    '$ SPM '11 15 17 15    '$ Minutes '20 20 20 20    '$ METs 3.43 3.45 3.48 1.9  Home Exercise Plan   Plans to continue exercise at Home (comment) Home (comment) Home (comment) Home (comment)    Frequency Add 2 additional days to program exercise sessions. Add 2 additional days to program exercise sessions. Add 2 additional days to program exercise sessions. Add 2 additional days to program exercise sessions.       Exercise Comments:      Exercise Comments    Row Name 09/24/16 1436 10/04/16 1538 10/23/16 0746 11/12/16 1415     Exercise Comments Patient has just started CR and will be progressed in time.  Patient is doing well in CR.  Patient is doing very well in CR.  Patient is doing well in CR. She has maintained her levels but hasn ot progressed due to wheezing and  substantial weight gain       Exercise Goals and Review:      Exercise Goals    Row Name 09/18/16 1454             Exercise Goals   Increase Physical Activity Yes       Intervention Provide advice, education, support and counseling about physical activity/exercise needs.;Develop an individualized exercise prescription for aerobic and resistive training based on initial evaluation findings, risk stratification, comorbidities and participant's personal goals.       Expected Outcomes Achievement of increased cardiorespiratory fitness and enhanced flexibility, muscular endurance and strength shown through measurements of functional capacity and personal statement of participant.       Increase Strength and Stamina Yes       Intervention Provide advice, education, support and counseling about physical activity/exercise needs.;Develop an individualized exercise prescription for aerobic and resistive training based on initial evaluation findings, risk stratification, comorbidities and participant's personal goals.       Expected Outcomes Achievement of increased cardiorespiratory fitness and enhanced flexibility, muscular endurance and strength shown through measurements of functional capacity and personal statement of participant.          Exercise Goals Re-Evaluation :     Exercise Goals Re-Evaluation    Row Name 10/10/16 1401 10/29/16 1514 11/23/16 1423         Exercise Goal Re-Evaluation   Exercise Goals Review Increase Physical Activity;Increase Strenth and Stamina Increase Physical Activity;Increase Strenth and Stamina Increase Physical Activity;Increase Strenth and Stamina  Be able to get up and do things and keep exercising.      Comments Patient has completed 9 sessions with some progression. She says she feels stronger and is able to walk better since she started. Will continue to monitor.  Patient has completed 15 sessions with some progression in her speed on treadmill,  weights and NuStep Level. She says she feels stronger but has not increased her activity d/t her SOB which she attributes to humid weather worsening her COPD. Will continue to monitor. Patient has completed 27 sessions with some progression in her treadmill speed since her last ITP. She has not been able to progress more d/t her SOB. Will continue to monitor.      Expected Outcomes Patient will complete the program with increased strength, stamina, and activity. Patient will complete the program with increased strength, stamina, and activity. Patient will complete the program with continued increased strength, stamina, and activity.          Discharge Exercise Prescription (Final Exercise Prescription Changes):     Exercise Prescription Changes - 11/12/16 1400      Response to Exercise   Blood Pressure (Admit) 138/62  Blood Pressure (Exercise) 146/72   Blood Pressure (Exit) 134/66   Heart Rate (Admit) 64 bpm   Heart Rate (Exercise) 77 bpm   Heart Rate (Exit) 72 bpm   Rating of Perceived Exertion (Exercise) 11   Duration Progress to 30 minutes of  aerobic without signs/symptoms of physical distress   Intensity THRR unchanged     Progression   Progression Continue to progress workloads to maintain intensity without signs/symptoms of physical distress.     Resistance Training   Training Prescription Yes   Weight 2   Reps 10-15     Treadmill   MPH 1.4   Grade 0   Minutes 15   METs 2     NuStep   Level 3   SPM 15   Minutes 20   METs 1.9     Home Exercise Plan   Plans to continue exercise at Home (comment)   Frequency Add 2 additional days to program exercise sessions.      Nutrition:  Target Goals: Understanding of nutrition guidelines, daily intake of sodium '1500mg'$ , cholesterol '200mg'$ , calories 30% from fat and 7% or less from saturated fats, daily to have 5 or more servings of fruits and vegetables.  Biometrics:     Pre Biometrics - 09/18/16 1434      Pre  Biometrics   Height '5\' 2"'$  (1.575 m)   Weight 148 lb 2.4 oz (67.2 kg)   Waist Circumference 36 inches   Hip Circumference 39 inches   Waist to Hip Ratio 0.92 %   BMI (Calculated) 27.2   Triceps Skinfold 15 mm   % Body Fat 36.7 %   Grip Strength 48.4 kg   Flexibility 14.75 in   Single Leg Stand 12.5 seconds       Nutrition Therapy Plan and Nutrition Goals:   Nutrition Discharge: Rate Your Plate Scores:     Nutrition Assessments - 09/18/16 1455      MEDFICTS Scores   Pre Score 45      Nutrition Goals Re-Evaluation:   Nutrition Goals Discharge (Final Nutrition Goals Re-Evaluation):   Psychosocial: Target Goals: Acknowledge presence or absence of significant depression and/or stress, maximize coping skills, provide positive support system. Participant is able to verbalize types and ability to use techniques and skills needed for reducing stress and depression.  Initial Review & Psychosocial Screening:     Initial Psych Review & Screening - 09/18/16 1457      Initial Review   Current issues with Current Depression     Family Dynamics   Good Support System? Yes     Barriers   Psychosocial barriers to participate in program The patient should benefit from training in stress management and relaxation.;Psychosocial barriers identified (see note)     Screening Interventions   Interventions Encouraged to exercise;Program counselor consult      Quality of Life Scores:     Quality of Life - 09/18/16 1435      Quality of Life Scores   Health/Function Pre 16.7 %   Socioeconomic Pre 10.43 %   Psych/Spiritual Pre 8.93 %   Family Pre 10.1 %   GLOBAL Pre 12.84 %      PHQ-9: Recent Review Flowsheet Data    Depression screen [facility] 2/9 09/18/2016   Decreased Interest 1   Down, Depressed, Hopeless 1   PHQ - 2 Score 2   Altered sleeping 1   Tired, decreased energy 1   Change in appetite 1   Feeling bad or  failure about yourself  1   Trouble concentrating 1    Moving slowly or fidgety/restless 1   Suicidal thoughts 1   PHQ-9 Score 9   Difficult doing work/chores Very difficult     Interpretation of Total Score  Total Score Depression Severity:  1-4 = Minimal depression, 5-9 = Mild depression, 10-14 = Moderate depression, 15-19 = Moderately severe depression, 20-27 = Severe depression   Psychosocial Evaluation and Intervention:     Psychosocial Evaluation - 09/18/16 1458      Psychosocial Evaluation & Interventions   Interventions Relaxation education;Encouraged to exercise with the program and follow exercise prescription;Therapist referral   Comments She will consult her MD about a therapist referral   Continue Psychosocial Services  Follow up required by staff      Psychosocial Re-Evaluation:     Psychosocial Re-Evaluation    [place] Name 10/10/16 1402 10/29/16 1516 11/23/16 1431         Psychosocial Re-Evaluation   Current issues with Current Depression Current Depression Current Depression     Comments Patient says she has an appointment to see her pcp in July. She is going to talk to him then about counseling and other treatment options for her depression. Patient has not seen her PCP yet to talk about counseling. She continues to want to talk with him herself about treatment for her depression. Patient is currently taking Paxil 20 mg daily and Alprazolam 0.5 mg as needed for anxiety. She feels her depression is better controlled now.      Expected Outcomes Patient's QOL and PHQ-9 scores will improve or remain the same at discharge.  Patient will have improved PHQ-9 and QOL scores at discharge.  Patient will have improved QOL and PHQ-9 scores at dishcarge and no additional psychosocial issues identified.      Interventions Stress management education;Encouraged to attend Cardiac Rehabilitation for the exercise;Relaxation education Relaxation education;Stress management education;Encouraged to attend Cardiac Rehabilitation for the  exercise Encouraged to attend Cardiac Rehabilitation for the exercise;Stress management education;Relaxation education     Continue Psychosocial Services  Follow up required by staff Follow up required by staff Follow up required by staff        Psychosocial Discharge (Final Psychosocial Re-Evaluation):     Psychosocial Re-Evaluation - 11/23/16 1431      Psychosocial Re-Evaluation   Current issues with Current Depression   Comments Patient is currently taking Paxil 20 mg daily and Alprazolam 0.5 mg as needed for anxiety. She feels her depression is better controlled now.    Expected Outcomes Patient will have improved QOL and PHQ-9 scores at dishcarge and no additional psychosocial issues identified.    Interventions Encouraged to attend Cardiac Rehabilitation for the exercise;Stress management education;Relaxation education   Continue Psychosocial Services  Follow up required by staff      Vocational Rehabilitation: Provide vocational rehab assistance to qualifying candidates.   Vocational Rehab Evaluation & Intervention:     Vocational Rehab - 09/18/16 1453      Initial Vocational Rehab Evaluation & Intervention   Assessment shows need for Vocational Rehabilitation No      Education: Education Goals: Education classes will be provided on a weekly basis, covering required topics. Participant will state understanding/return demonstration of topics presented.  Learning Barriers/Preferences:     Learning Barriers/Preferences - 09/18/16 1452      Learning Barriers/Preferences   Learning Barriers None   Learning Preferences Group Instruction;Skilled Demonstration;Verbal Instruction;Video;Pictoral      Education Topics: Hypertension, Hypertension Reduction -  Define heart disease and high blood pressure. Discus how high blood pressure affects the body and ways to reduce high blood pressure.   CARDIAC REHAB PHASE II EXERCISE from 11/21/2016 in [place]   Date  11/21/16  Educator  [place]  Instruction Review Code  2- meets goals/outcomes      Exercise and Your Heart -Discuss why it is important to exercise, the FITT principles of exercise, normal and abnormal responses to exercise, and how to exercise safely.   Angina -Discuss definition of angina, causes of angina, treatment of angina, and how to decrease risk of having angina.   Cardiac Medications -Review what the following cardiac medications are used for, how they affect the body, and side effects that may occur when taking the medications.  Medications include Aspirin, Beta blockers, calcium channel blockers, ACE Inhibitors, angiotensin receptor blockers, diuretics, digoxin, and antihyperlipidemics.   Congestive Heart Failure -Discuss the definition of CHF, how to live with CHF, the signs and symptoms of CHF, and how keep track of weight and sodium intake.   Heart Disease and Intimacy -Discus the effect sexual activity has on the heart, how changes occur during intimacy as we age, and safety during sexual activity.   CARDIAC REHAB PHASE II EXERCISE from 11/21/2016 in [place]  Date  09/26/16  Educator  DJ  Instruction Review Code  2- meets goals/outcomes      Smoking Cessation / COPD -Discuss different methods to quit smoking, the health benefits of quitting smoking, and the definition of COPD.   CARDIAC REHAB PHASE II EXERCISE from 11/21/2016 in [place]  Date  10/03/16  Educator  Russella Dar  Instruction Review Code  2- meets goals/outcomes      Nutrition I: Fats -Discuss the types of cholesterol, what cholesterol does to the heart, and how cholesterol levels can be controlled.   CARDIAC REHAB PHASE II EXERCISE from 11/21/2016 in [place]  Date  10/10/16  Educator  DC  Instruction Review Code  2- meets goals/outcomes      Nutrition II: Labels -Discuss the different components of food labels and  how to read food label   CARDIAC REHAB PHASE II EXERCISE from 11/21/2016 in [place]  Date  10/17/16  Educator  DC  Instruction Review Code  2- meets goals/outcomes      Heart Parts and Heart Disease -Discuss the anatomy of the heart, the pathway of blood circulation through the heart, and these are affected by heart disease.   CARDIAC REHAB PHASE II EXERCISE from 11/21/2016 in [place]  Date  10/26/16  Educator  DC  Instruction Review Code  2- meets goals/outcomes      Stress I: Signs and Symptoms -Discuss the causes of stress, how stress may lead to anxiety and depression, and ways to limit stress.   CARDIAC REHAB PHASE II EXERCISE from 11/21/2016 in [place]  Date  10/31/16  Educator  [place]  Instruction Review Code  2- meets goals/outcomes      Stress II: Relaxation -Discuss different types of relaxation techniques to limit stress.   CARDIAC REHAB PHASE II EXERCISE from 11/21/2016 in [place]  Date  11/07/16  Educator  [place]  Instruction Review Code  2- meets goals/outcomes      Warning Signs of Stroke / TIA -Discuss definition of a stroke, what the signs and symptoms are of a stroke, and how to identify  when someone is having stroke.   CARDIAC REHAB PHASE II EXERCISE from 11/21/2016 in [place] [facility]  Date  11/14/16  Educator  DC  Instruction Review Code  2- meets goals/outcomes      Knowledge Questionnaire Score:     Knowledge Questionnaire Score - 09/18/16 1453      Knowledge Questionnaire Score   Pre Score 20/24      Core Components/Risk Factors/Patient Goals at Admission:     Personal Goals and Risk Factors at Admission - 09/18/16 1455      Core Components/Risk Factors/Patient Goals on Admission    Weight Management Weight Maintenance   Improve shortness of breath with ADL's Yes   Intervention Provide education, individualized exercise plan and  daily activity instruction to help decrease symptoms of SOB with activities of daily living.   Expected Outcomes Short Term: Achieves a reduction of symptoms when performing activities of daily living.   Personal Goal Other Yes   Personal Goal Keep exercising, Be able to get up and do things more regularily.   Intervention Attend CR 3xweek and supplement at home 2xweek.   Expected Outcomes Achieve personal goals      Core Components/Risk Factors/Patient Goals Review:      Goals and Risk Factor Review    Row Name 09/18/16 1457 10/10/16 1357 10/29/16 1508 11/23/16 1420       Core Components/Risk Factors/Patient Goals Review   Personal Goals Review Weight Management/Obesity;Improve shortness of breath with ADL's;Stress Weight Management/Obesity;Improve shortness of breath with ADL's  Be able to get up and do things; keep excercising. Weight Management/Obesity  Be able to get up and do things. Keep exercising.  Weight Management/Obesity;Improve shortness of breath with ADL's    Review  - Patient has completed 9 sessions gaining 7.2 lbs. Patient says she is walking better and feels more stable on her feet. She says she feels stronger. We contacted her cardiologist d/t at 10 lbs weight gain in 2 weeks with periorbital edema. She say PA today. Her Lasix dosage was adjusted and she was put on a low Na diet. She is to f/u in 2 months. Will continue to monitor.  Patient has completed 16 sessions gaining 10 lbs. Her weight continues to fluctuate. She seen her cardiologist 10/25/16 d/t weight gain and edema and increased SOB. He ordered labs and an echo and referred her to Dr. Juanetta Gosling for evaluation of her COPD. She has an appointment to see him later this month. She continues to have periorbital edema and wheezing. She continues to say the program has helped her stability and balance. Will continue to monitor.  Patient has completed 27 sessions gaining 5 lbs. Her weight continues to fluctuates. She was  evaluated by Dr. Juanetta Gosling since her last ITP and he added an inhaler. She says her SOB has improved some but the humidity increases the SOB. She continues to do well in the program with some progression. She says her SOB has improved some and she feels stronger. Will continue to monitor.     Expected Outcomes  - Patient will complete the program meeting her personal goals.  Patient will complete the program meeting her personal goals.  Patient will complete the program and continue to meet her personal goals.        Core Components/Risk Factors/Patient Goals at Discharge (Final Review):      Goals and Risk Factor Review - 11/23/16 1420      Core Components/Risk Factors/Patient Goals Review   Personal Goals  Review Weight Management/Obesity;Improve shortness of breath with ADL's   Review Patient has completed 27 sessions gaining 5 lbs. Her weight continues to fluctuates. She was evaluated by Dr. Luan Pulling since her last ITP and he added an inhaler. She says her SOB has improved some but the humidity increases the SOB. She continues to do well in the program with some progression. She says her SOB has improved some and she feels stronger. Will continue to monitor.    Expected Outcomes Patient will complete the program and continue to meet her personal goals.       ITP Comments:     ITP Comments    Row Name 09/27/16 1244           ITP Comments Patient met with Registered Dietitian to discuss nutrition topics including: Heart healthty eating, heart healthy cooking and making smart choices when shopping; Portion control; weight management; and hydration. Patient attended a group session with the hospital chaplian called [facility] to discuss and share how this recent diagnosis has effected their life          Comments: ITP 30 Day REVIEW Patient continues to do well in the program. Will continue to monitor for progress.

## 2016-11-26 ENCOUNTER — Encounter (HOSPITAL_COMMUNITY)
Admission: RE | Admit: 2016-11-26 | Discharge: 2016-11-26 | Disposition: A | Discharge status: Home / Self Care | Payer: Medicare Other | Source: Ambulatory Visit | Attending: Cardiology | Admitting: Cardiology

## 2016-11-26 DIAGNOSIS — Z86718 Personal history of other venous thrombosis and embolism: Secondary | ICD-10-CM | POA: Diagnosis not present

## 2016-11-26 DIAGNOSIS — I5032 Chronic diastolic (congestive) heart failure: Secondary | ICD-10-CM | POA: Diagnosis not present

## 2016-11-26 DIAGNOSIS — Z9889 Other specified postprocedural states: Secondary | ICD-10-CM

## 2016-11-26 DIAGNOSIS — K219 Gastro-esophageal reflux disease without esophagitis: Secondary | ICD-10-CM | POA: Diagnosis not present

## 2016-11-26 DIAGNOSIS — Z7901 Long term (current) use of anticoagulants: Secondary | ICD-10-CM | POA: Diagnosis not present

## 2016-11-26 NOTE — Progress Notes (Signed)
Daily Session Note  Patient Details  Name: [name] [name] MRN: [id number] Date of Birth: [birth year] Referring Provider:     CARDIAC REHAB PHASE II ORIENTATION from 09/18/2016 in [place]  Referring Provider  Dr. [name]      Encounter Date: 11/26/2016  Check In:     Session Check In - 11/26/16 0825      Check-In   Location [facility]   Staff Present Suzanne Boron, BS, EP, Exercise Physiologist;Debra Wynetta Emery, RN, BSN   Supervising physician immediately available to respond to emergencies See telemetry face sheet for immediately available MD   Medication changes reported     No   Fall or balance concerns reported    No   Warm-up and Cool-down Performed as group-led instruction   Resistance Training Performed Yes   VAD Patient? No     Pain Assessment   Currently in Pain? No/denies   Pain Score 0-No pain   Multiple Pain Sites No      Capillary Blood Glucose: No results found for this or any previous visit (from the past 24 hour(s)).    History  Smoking Status  . Never Smoker  Smokeless Tobacco  . Never Used    Goals Met:  Independence with exercise equipment Exercise tolerated well No report of cardiac concerns or symptoms Strength training completed today  Goals Unmet:  Not Applicable  Comments: Check out 915   Dr. Kate Sable is Medical Director for [place] and Pulmonary Rehab.

## 2016-11-27 ENCOUNTER — Ambulatory Visit (INDEPENDENT_AMBULATORY_CARE_PROVIDER_SITE_OTHER): Payer: Medicare Other | Admitting: *Deleted

## 2016-11-27 DIAGNOSIS — J449 Chronic obstructive pulmonary disease, unspecified: Secondary | ICD-10-CM | POA: Diagnosis not present

## 2016-11-27 DIAGNOSIS — Z713 Dietary counseling and surveillance: Secondary | ICD-10-CM | POA: Diagnosis not present

## 2016-11-27 DIAGNOSIS — Z9889 Other specified postprocedural states: Secondary | ICD-10-CM

## 2016-11-27 DIAGNOSIS — Z5181 Encounter for therapeutic drug level monitoring: Secondary | ICD-10-CM | POA: Diagnosis not present

## 2016-11-27 DIAGNOSIS — Z6823 Body mass index (BMI) 23.0-23.9, adult: Secondary | ICD-10-CM | POA: Diagnosis not present

## 2016-11-27 DIAGNOSIS — D649 Anemia, unspecified: Secondary | ICD-10-CM | POA: Diagnosis not present

## 2016-11-27 DIAGNOSIS — F411 Generalized anxiety disorder: Secondary | ICD-10-CM | POA: Diagnosis not present

## 2016-11-27 DIAGNOSIS — I481 Persistent atrial fibrillation: Secondary | ICD-10-CM | POA: Diagnosis not present

## 2016-11-27 DIAGNOSIS — I4891 Unspecified atrial fibrillation: Secondary | ICD-10-CM | POA: Diagnosis not present

## 2016-11-27 DIAGNOSIS — I4819 Other persistent atrial fibrillation: Secondary | ICD-10-CM

## 2016-11-27 DIAGNOSIS — E785 Hyperlipidemia, unspecified: Secondary | ICD-10-CM | POA: Diagnosis not present

## 2016-11-27 DIAGNOSIS — Z299 Encounter for prophylactic measures, unspecified: Secondary | ICD-10-CM | POA: Diagnosis not present

## 2016-11-27 DIAGNOSIS — N183 Chronic kidney disease, stage 3 (moderate): Secondary | ICD-10-CM | POA: Diagnosis not present

## 2016-11-27 LAB — POCT INR: INR: 1.9

## 2016-11-28 ENCOUNTER — Encounter (HOSPITAL_COMMUNITY)
Admission: RE | Admit: 2016-11-28 | Discharge: 2016-11-28 | Disposition: A | Discharge status: Home / Self Care | Payer: Medicare Other | Source: Ambulatory Visit | Attending: Cardiology | Admitting: Cardiology

## 2016-11-28 DIAGNOSIS — K219 Gastro-esophageal reflux disease without esophagitis: Secondary | ICD-10-CM | POA: Diagnosis not present

## 2016-11-28 DIAGNOSIS — Z7901 Long term (current) use of anticoagulants: Secondary | ICD-10-CM | POA: Diagnosis not present

## 2016-11-28 DIAGNOSIS — Z86718 Personal history of other venous thrombosis and embolism: Secondary | ICD-10-CM | POA: Diagnosis not present

## 2016-11-28 DIAGNOSIS — Z9889 Other specified postprocedural states: Secondary | ICD-10-CM

## 2016-11-28 DIAGNOSIS — I5032 Chronic diastolic (congestive) heart failure: Secondary | ICD-10-CM | POA: Diagnosis not present

## 2016-11-28 NOTE — Progress Notes (Signed)
Daily Session Note  Patient Details  Name: KEYANI RIGDON MRN: [id number] Date of Birth: [birth year] Referring Provider:     CARDIAC REHAB PHASE II ORIENTATION from 09/18/2016 in [place]  Referring Provider  Dr. [name]      Encounter Date: 11/28/2016  Check In:     Session Check In - 11/28/16 0820      Check-In   Location [facility]   Staff Present Aundra Dubin, RN, BSN;Anquanette Bahner Luther Parody, BS, EP, Exercise Physiologist   Supervising physician immediately available to respond to emergencies See telemetry face sheet for immediately available MD   Medication changes reported     No   Fall or balance concerns reported    No   Warm-up and Cool-down Performed as group-led instruction   Resistance Training Performed Yes   VAD Patient? No     Pain Assessment   Currently in Pain? No/denies   Pain Score 0-No pain   Multiple Pain Sites No      Capillary Blood Glucose: Results for orders placed or performed in visit on 11/27/16 (from the past 24 hour(s))  POCT INR     Status: Abnormal   Collection Time: 11/27/16  8:53 AM  Result Value Ref Range   INR 1.9       History  Smoking Status  . Never Smoker  Smokeless Tobacco  . Never Used    Goals Met:  Independence with exercise equipment Exercise tolerated well No report of cardiac concerns or symptoms Strength training completed today  Goals Unmet:  Not Applicable  Comments: Check out 915   Dr. Kate Sable is Medical Director for [place] and Pulmonary Rehab.

## 2016-11-29 DIAGNOSIS — M159 Polyosteoarthritis, unspecified: Secondary | ICD-10-CM | POA: Diagnosis not present

## 2016-11-29 DIAGNOSIS — I4891 Unspecified atrial fibrillation: Secondary | ICD-10-CM | POA: Diagnosis not present

## 2016-11-29 DIAGNOSIS — E7800 Pure hypercholesterolemia, unspecified: Secondary | ICD-10-CM | POA: Diagnosis not present

## 2016-11-29 DIAGNOSIS — J449 Chronic obstructive pulmonary disease, unspecified: Secondary | ICD-10-CM | POA: Diagnosis not present

## 2016-11-30 ENCOUNTER — Encounter (HOSPITAL_COMMUNITY)
Admission: RE | Admit: 2016-11-30 | Discharge: 2016-11-30 | Disposition: A | Discharge status: Home / Self Care | Payer: Medicare Other | Source: Ambulatory Visit | Attending: Cardiology | Admitting: Cardiology

## 2016-11-30 DIAGNOSIS — I5032 Chronic diastolic (congestive) heart failure: Secondary | ICD-10-CM | POA: Diagnosis not present

## 2016-11-30 DIAGNOSIS — K219 Gastro-esophageal reflux disease without esophagitis: Secondary | ICD-10-CM | POA: Diagnosis not present

## 2016-11-30 DIAGNOSIS — Z86718 Personal history of other venous thrombosis and embolism: Secondary | ICD-10-CM | POA: Diagnosis not present

## 2016-11-30 DIAGNOSIS — Z9889 Other specified postprocedural states: Secondary | ICD-10-CM

## 2016-11-30 DIAGNOSIS — Z7901 Long term (current) use of anticoagulants: Secondary | ICD-10-CM | POA: Diagnosis not present

## 2016-11-30 NOTE — Progress Notes (Signed)
Daily Session Note  Patient Details  Name: [name] [name] MRN: [id number] Date of Birth: 08/13/1940 Referring Provider:     CARDIAC REHAB PHASE II ORIENTATION from 09/18/2016 in [place]  Referring Provider  Dr. [name]      Encounter Date: 11/30/2016  Check In:     Session Check In - 11/30/16 0826      Check-In   Location [facility]   Staff Present Russella Dar, MS, EP, [facility] Exercise Physiologist;Margie Brink Luther Parody, BS, EP, Exercise Physiologist   Supervising physician immediately available to respond to emergencies See telemetry face sheet for immediately available MD   Medication changes reported     No   Fall or balance concerns reported    No   Warm-up and Cool-down Performed as group-led instruction   Resistance Training Performed Yes   VAD Patient? No     Pain Assessment   Currently in Pain? No/denies   Pain Score 0-No pain   Multiple Pain Sites No      Capillary Blood Glucose: No results found for this or any previous visit (from the past 24 hour(s)).    History  Smoking Status  . Never Smoker  Smokeless Tobacco  . Never Used    Goals Met:  Independence with exercise equipment Exercise tolerated well No report of cardiac concerns or symptoms Strength training completed today  Goals Unmet:  Not Applicable  Comments: Check out 915   Dr. Kate Sable is Medical Director for [place] and Pulmonary Rehab.

## 2016-12-03 ENCOUNTER — Encounter (HOSPITAL_COMMUNITY)
Admission: RE | Admit: 2016-12-03 | Discharge: 2016-12-03 | Disposition: A | Discharge status: Home / Self Care | Payer: Medicare Other | Source: Ambulatory Visit | Attending: Cardiology | Admitting: Cardiology

## 2016-12-03 DIAGNOSIS — Z7901 Long term (current) use of anticoagulants: Secondary | ICD-10-CM | POA: Diagnosis not present

## 2016-12-03 DIAGNOSIS — I5032 Chronic diastolic (congestive) heart failure: Secondary | ICD-10-CM | POA: Diagnosis not present

## 2016-12-03 DIAGNOSIS — Z86718 Personal history of other venous thrombosis and embolism: Secondary | ICD-10-CM | POA: Diagnosis not present

## 2016-12-03 DIAGNOSIS — Z9889 Other specified postprocedural states: Secondary | ICD-10-CM

## 2016-12-03 DIAGNOSIS — K219 Gastro-esophageal reflux disease without esophagitis: Secondary | ICD-10-CM | POA: Diagnosis not present

## 2016-12-03 NOTE — Progress Notes (Signed)
Daily Session Note  Patient Details  Name: [name] [name] MRN: [id number] Date of Birth: [birth year] Referring Provider:     CARDIAC REHAB PHASE II ORIENTATION from 09/18/2016 in [place]  Referring Provider  Dr. [name]      Encounter Date: 12/03/2016  Check In:     Session Check In - 12/03/16 0828      Check-In   Location [facility]   Staff Present Russella Dar, MS, EP, [facility], Exercise Physiologist;Loredana Medellin Luther Parody, BS, EP, Exercise Physiologist   Supervising physician immediately available to respond to emergencies See telemetry face sheet for immediately available MD   Medication changes reported     No   Fall or balance concerns reported    No   Warm-up and Cool-down Performed as group-led instruction   Resistance Training Performed Yes   VAD Patient? No     Pain Assessment   Currently in Pain? No/denies   Pain Score 0-No pain   Multiple Pain Sites No      Capillary Blood Glucose: No results found for this or any previous visit (from the past 24 hour(s)).    History  Smoking Status  . Never Smoker  Smokeless Tobacco  . Never Used    Goals Met:  Independence with exercise equipment Exercise tolerated well No report of cardiac concerns or symptoms Strength training completed today  Goals Unmet:  Not Applicable  Comments: Check out 915   Dr. Kate Sable is Medical Director for [place] and Pulmonary Rehab.

## 2016-12-04 ENCOUNTER — Ambulatory Visit (INDEPENDENT_AMBULATORY_CARE_PROVIDER_SITE_OTHER): Payer: Medicare Other | Admitting: *Deleted

## 2016-12-04 DIAGNOSIS — Z9889 Other specified postprocedural states: Secondary | ICD-10-CM | POA: Diagnosis not present

## 2016-12-04 DIAGNOSIS — Z5181 Encounter for therapeutic drug level monitoring: Secondary | ICD-10-CM | POA: Diagnosis not present

## 2016-12-04 DIAGNOSIS — I481 Persistent atrial fibrillation: Secondary | ICD-10-CM

## 2016-12-04 DIAGNOSIS — I4819 Other persistent atrial fibrillation: Secondary | ICD-10-CM

## 2016-12-04 DIAGNOSIS — M810 Age-related osteoporosis without current pathological fracture: Secondary | ICD-10-CM | POA: Diagnosis not present

## 2016-12-04 LAB — POCT INR: INR: 2.4

## 2016-12-05 ENCOUNTER — Encounter (HOSPITAL_COMMUNITY)
Admission: RE | Admit: 2016-12-05 | Discharge: 2016-12-05 | Disposition: A | Discharge status: Home / Self Care | Payer: Medicare Other | Source: Ambulatory Visit | Attending: Cardiology | Admitting: Cardiology

## 2016-12-05 DIAGNOSIS — I5032 Chronic diastolic (congestive) heart failure: Secondary | ICD-10-CM | POA: Diagnosis not present

## 2016-12-05 DIAGNOSIS — Z9889 Other specified postprocedural states: Secondary | ICD-10-CM | POA: Diagnosis not present

## 2016-12-05 DIAGNOSIS — K219 Gastro-esophageal reflux disease without esophagitis: Secondary | ICD-10-CM | POA: Diagnosis not present

## 2016-12-05 DIAGNOSIS — Z86718 Personal history of other venous thrombosis and embolism: Secondary | ICD-10-CM | POA: Diagnosis not present

## 2016-12-05 DIAGNOSIS — Z7901 Long term (current) use of anticoagulants: Secondary | ICD-10-CM | POA: Diagnosis not present

## 2016-12-05 NOTE — Progress Notes (Signed)
Daily Session Note  Patient Details  Name: [name] [name] MRN: [id number] Date of Birth: [birth year] Referring Provider:     CARDIAC REHAB PHASE II ORIENTATION from 09/18/2016 in [place]  Referring Provider  Dr. [name]      Encounter Date: 12/05/2016  Check In:     Session Check In - 12/05/16 0824      Check-In   Location [facility]   Staff Present Aundra Dubin, RN, BSN;[place] Athanas Luther Parody, BS, EP, Exercise Physiologist   Supervising physician immediately available to respond to emergencies See telemetry face sheet for immediately available MD   Medication changes reported     No   Fall or balance concerns reported    No   Warm-up and Cool-down Performed as group-led instruction   Resistance Training Performed Yes   VAD Patient? No     Pain Assessment   Currently in Pain? No/denies   Pain Score 0-No pain   Multiple Pain Sites No      Capillary Blood Glucose: Results for orders placed or performed in visit on 12/04/16 (from the past 24 hour(s))  POCT INR     Status: Abnormal   Collection Time: 12/04/16  8:28 AM  Result Value Ref Range   INR 2.4       History  Smoking Status  . Never Smoker  Smokeless Tobacco  . Never Used    Goals Met:  Independence with exercise equipment Exercise tolerated well No report of cardiac concerns or symptoms Strength training completed today  Goals Unmet:  Not Applicable  Comments: Check out 915   Dr. Kate Sable is Medical Director for [place] and Pulmonary Rehab.

## 2016-12-06 ENCOUNTER — Other Ambulatory Visit: Payer: Self-pay | Admitting: Physician Assistant

## 2016-12-07 ENCOUNTER — Encounter (HOSPITAL_COMMUNITY)
Admission: RE | Admit: 2016-12-07 | Discharge: 2016-12-07 | Disposition: A | Discharge status: Home / Self Care | Payer: Medicare Other | Source: Ambulatory Visit | Attending: Cardiology | Admitting: Cardiology

## 2016-12-07 DIAGNOSIS — Z9889 Other specified postprocedural states: Secondary | ICD-10-CM

## 2016-12-07 DIAGNOSIS — K219 Gastro-esophageal reflux disease without esophagitis: Secondary | ICD-10-CM | POA: Diagnosis not present

## 2016-12-07 DIAGNOSIS — Z7901 Long term (current) use of anticoagulants: Secondary | ICD-10-CM | POA: Diagnosis not present

## 2016-12-07 DIAGNOSIS — I5032 Chronic diastolic (congestive) heart failure: Secondary | ICD-10-CM | POA: Diagnosis not present

## 2016-12-07 DIAGNOSIS — Z86718 Personal history of other venous thrombosis and embolism: Secondary | ICD-10-CM | POA: Diagnosis not present

## 2016-12-07 NOTE — Progress Notes (Signed)
Daily Session Note  Patient Details  Name: JALINA BLOWERS MRN: [id number] Date of Birth: 1940-09-27 Referring Provider:     CARDIAC REHAB PHASE II ORIENTATION from 09/18/2016 in [place]  Referring Provider  Dr. [name]      Encounter Date: 12/07/2016  Check In:     Session Check In - 12/07/16 0837      Check-In   Location [facility]   Staff Present Aundra Dubin, RN, BSN;Semaj Coburn Luther Parody, BS, EP, Exercise Physiologist   Supervising physician immediately available to respond to emergencies See telemetry face sheet for immediately available MD   Medication changes reported     No   Fall or balance concerns reported    No   Warm-up and Cool-down Performed as group-led instruction   Resistance Training Performed Yes   VAD Patient? No     Pain Assessment   Currently in Pain? No/denies   Pain Score 0-No pain   Multiple Pain Sites No      Capillary Blood Glucose: No results found for this or any previous visit (from the past 24 hour(s)).    History  Smoking Status  . Never Smoker  Smokeless Tobacco  . Never Used    Goals Met:  Independence with exercise equipment Exercise tolerated well No report of cardiac concerns or symptoms Strength training completed today  Goals Unmet:  Not Applicable  Comments: Check out 915   Dr. Kate Sable is Medical Director for [place] and Pulmonary Rehab.

## 2016-12-10 ENCOUNTER — Encounter (HOSPITAL_COMMUNITY)
Admission: RE | Admit: 2016-12-10 | Discharge: 2016-12-10 | Disposition: A | Discharge status: Home / Self Care | Payer: Medicare Other | Source: Ambulatory Visit | Attending: Cardiology | Admitting: Cardiology

## 2016-12-10 DIAGNOSIS — K219 Gastro-esophageal reflux disease without esophagitis: Secondary | ICD-10-CM | POA: Diagnosis not present

## 2016-12-10 DIAGNOSIS — Z86718 Personal history of other venous thrombosis and embolism: Secondary | ICD-10-CM | POA: Diagnosis not present

## 2016-12-10 DIAGNOSIS — I5032 Chronic diastolic (congestive) heart failure: Secondary | ICD-10-CM | POA: Diagnosis not present

## 2016-12-10 DIAGNOSIS — Z9889 Other specified postprocedural states: Secondary | ICD-10-CM

## 2016-12-10 DIAGNOSIS — Z7901 Long term (current) use of anticoagulants: Secondary | ICD-10-CM | POA: Diagnosis not present

## 2016-12-10 NOTE — Progress Notes (Signed)
Daily Session Note  Patient Details  Name: VERNELLA NIZNIK MRN: [id number] Date of Birth: 12-02-1940 Referring Provider:     CARDIAC REHAB PHASE II ORIENTATION from 09/18/2016 in [place]  Referring Provider  Dr. [name]      Encounter Date: 12/10/2016  Check In:     Session Check In - 12/10/16 0811      Check-In   Location [facility]   Staff Present Aundra Dubin, RN, BSN;Sigourney Portillo Luther Parody, BS, EP, Exercise Physiologist   Supervising physician immediately available to respond to emergencies See telemetry face sheet for immediately available MD   Medication changes reported     No   Fall or balance concerns reported    No   Warm-up and Cool-down Performed as group-led instruction   Resistance Training Performed Yes   VAD Patient? No     Pain Assessment   Currently in Pain? No/denies   Pain Score 0-No pain   Multiple Pain Sites No      Capillary Blood Glucose: No results found for this or any previous visit (from the past 24 hour(s)).    History  Smoking Status  . Never Smoker  Smokeless Tobacco  . Never Used    Goals Met:  Independence with exercise equipment Exercise tolerated well No report of cardiac concerns or symptoms Strength training completed today  Goals Unmet:  Not Applicable  Comments: Check out 915   Dr. Kate Sable is Medical Director for [place] and Pulmonary Rehab.

## 2016-12-12 ENCOUNTER — Encounter (HOSPITAL_COMMUNITY)
Admission: RE | Admit: 2016-12-12 | Discharge: 2016-12-12 | Disposition: A | Discharge status: Home / Self Care | Payer: Medicare Other | Source: Ambulatory Visit | Attending: Cardiology | Admitting: Cardiology

## 2016-12-12 DIAGNOSIS — Z9889 Other specified postprocedural states: Secondary | ICD-10-CM

## 2016-12-12 DIAGNOSIS — Z86718 Personal history of other venous thrombosis and embolism: Secondary | ICD-10-CM | POA: Diagnosis not present

## 2016-12-12 DIAGNOSIS — I5032 Chronic diastolic (congestive) heart failure: Secondary | ICD-10-CM | POA: Diagnosis not present

## 2016-12-12 DIAGNOSIS — K219 Gastro-esophageal reflux disease without esophagitis: Secondary | ICD-10-CM | POA: Diagnosis not present

## 2016-12-12 DIAGNOSIS — Z7901 Long term (current) use of anticoagulants: Secondary | ICD-10-CM | POA: Diagnosis not present

## 2016-12-12 NOTE — Progress Notes (Signed)
Daily Session Note  Patient Details  Name: [name] [name] MRN: [id number] Date of Birth: Aug 26, 1940 Referring Provider:     CARDIAC REHAB PHASE II ORIENTATION from 09/18/2016 in [place]  Referring Provider  Dr. [name]      Encounter Date: 12/12/2016  Check In:     Session Check In - 12/12/16 0809      Check-In   Location [facility]   Staff Present Aundra Dubin, RN, BSN;Jerone Cud[place] Luther Parody, BS, EP, Exercise Physiologist   Supervising physician immediately available to respond to emergencies See telemetry face sheet for immediately available MD   Medication changes reported     No   Fall or balance concerns reported    No   Warm-up and Cool-down Performed as group-led instruction   Resistance Training Performed Yes   VAD Patient? No     Pain Assessment   Currently in Pain? No/denies   Pain Score 0-No pain   Multiple Pain Sites No      Capillary Blood Glucose: No results found for this or any previous visit (from the past 24 hour(s)).    History  Smoking Status  . Never Smoker  Smokeless Tobacco  . Never Used    Goals Met:  Independence with exercise equipment Exercise tolerated well No report of cardiac concerns or symptoms Strength training completed today  Goals Unmet:  Not Applicable  Comments: Check out 915   Dr. Kate Sable is Medical Director for [place] and Pulmonary Rehab.

## 2016-12-13 ENCOUNTER — Ambulatory Visit (INDEPENDENT_AMBULATORY_CARE_PROVIDER_SITE_OTHER): Payer: Medicare Other | Admitting: *Deleted

## 2016-12-13 ENCOUNTER — Ambulatory Visit (HOSPITAL_COMMUNITY): Payer: Self-pay | Admitting: Nurse Practitioner

## 2016-12-13 DIAGNOSIS — I481 Persistent atrial fibrillation: Secondary | ICD-10-CM

## 2016-12-13 DIAGNOSIS — Z5181 Encounter for therapeutic drug level monitoring: Secondary | ICD-10-CM

## 2016-12-13 DIAGNOSIS — Z9889 Other specified postprocedural states: Secondary | ICD-10-CM

## 2016-12-13 DIAGNOSIS — Z23 Encounter for immunization: Secondary | ICD-10-CM | POA: Diagnosis not present

## 2016-12-13 DIAGNOSIS — I4819 Other persistent atrial fibrillation: Secondary | ICD-10-CM

## 2016-12-13 LAB — POCT INR: INR: 2.3

## 2016-12-14 ENCOUNTER — Encounter (HOSPITAL_COMMUNITY)
Admission: RE | Admit: 2016-12-14 | Discharge: 2016-12-14 | Disposition: A | Discharge status: Home / Self Care | Payer: Medicare Other | Source: Ambulatory Visit | Attending: Cardiology | Admitting: Cardiology

## 2016-12-14 VITALS — Ht 62.0 in | Wt 143.7 lb

## 2016-12-14 DIAGNOSIS — K219 Gastro-esophageal reflux disease without esophagitis: Secondary | ICD-10-CM | POA: Diagnosis not present

## 2016-12-14 DIAGNOSIS — Z86718 Personal history of other venous thrombosis and embolism: Secondary | ICD-10-CM | POA: Diagnosis not present

## 2016-12-14 DIAGNOSIS — Z9889 Other specified postprocedural states: Secondary | ICD-10-CM

## 2016-12-14 DIAGNOSIS — Z7901 Long term (current) use of anticoagulants: Secondary | ICD-10-CM | POA: Diagnosis not present

## 2016-12-14 DIAGNOSIS — I5032 Chronic diastolic (congestive) heart failure: Secondary | ICD-10-CM | POA: Diagnosis not present

## 2016-12-14 NOTE — Progress Notes (Signed)
Discharge Summary  Patient Details  Name: [name] [name] MRN: [id number] Date of Birth: [birth year] Referring Provider:     CARDIAC REHAB PHASE II ORIENTATION from 09/18/2016 in [place]  Referring Provider  Dr. [name]       Number of Visits: 36  Reason for Discharge:  Patient reached a stable level of exercise. Patient independent in their exercise.  Smoking History:  History  Smoking Status  . Never Smoker  Smokeless Tobacco  . Never Used    Diagnosis:  S/P mitral valve repair  ADL UCSD:   Initial Exercise Prescription:     Initial Exercise Prescription - 09/18/16 1400      Date of Initial Exercise RX and Referring Provider   Date 09/18/16   Referring Provider Dr. [name]     Treadmill   MPH 1.3   Grade 0   Minutes 15   METs 1.9     NuStep   Level 2   SPM 10   Minutes 20   METs 1.7     Prescription Details   Frequency (times per week) 3   Duration Progress to 30 minutes of continuous aerobic without signs/symptoms of physical distress     Intensity   THRR 40-80% of Max Heartrate 102-116-130   Ratings of Perceived Exertion 11-13   Perceived Dyspnea 0-4     Progression   Progression Continue progressive overload as per policy without signs/symptoms or physical distress.     Resistance Training   Training Prescription Yes   Weight 1   Reps 10-15      Discharge Exercise Prescription (Final Exercise Prescription Changes):     Exercise Prescription Changes - 12/04/16 0700      Response to Exercise   Blood Pressure (Admit) 134/60   Blood Pressure (Exercise) 130/64   Blood Pressure (Exit) 130/70   Heart Rate (Admit) 64 bpm   Heart Rate (Exercise) 82 bpm   Heart Rate (Exit) 70 bpm   Rating of Perceived Exertion (Exercise) 11   Duration Progress to 30 minutes of  aerobic without signs/symptoms of physical distress   Intensity THRR unchanged     Progression   Progression Continue to progress workloads to  maintain intensity without signs/symptoms of physical distress.     Resistance Training   Training Prescription Yes   Weight 2   Reps 10-15     Treadmill   MPH 1.5   Grade 0   Minutes 15   METs 2.1     NuStep   Level 3   SPM 84   Minutes 20   METs 1.9     Home Exercise Plan   Plans to continue exercise at Home (comment)   Frequency Add 2 additional days to program exercise sessions.      Functional Capacity:     6 Minute Walk    Row Name 09/18/16 1432 12/14/16 1411       6 Minute Walk   Phase Initial Discharge    Distance 1200 feet 1450 feet    Distance % Change 0 % 20.83 %    Walk Time 6 minutes 60 minutes    # of Rest Breaks 0 0    MPH 2.27 2.74    METS 2.74 31    RPE 11 12    Perceived Dyspnea  13 11    VO2 Peak 9.04 9    Symptoms No No    Resting HR 74 bpm 76 bpm    Resting  BP 122/64 120/62    Max Ex. HR 114 bpm 63 bpm    Max Ex. BP 146/70 160/76    2 Minute Post BP 130/60 130/70       Psychological, QOL, Others - Outcomes: PHQ 2/9: Depression screen [facility] 2/9 12/14/2016 09/18/2016  Decreased Interest 0 1  Down, Depressed, Hopeless 1 1  PHQ - 2 Score 1 2  Altered sleeping 1 1  Tired, decreased energy 1 1  Change in appetite 0 1  Feeling bad or failure about yourself  0 1  Trouble concentrating 1 1  Moving slowly or fidgety/restless 0 1  Suicidal thoughts 0 1  PHQ-9 Score 4 9  Difficult doing work/chores Somewhat difficult Very difficult    Quality of Life:     Quality of Life - 12/14/16 1414      Quality of Life Scores   Health/Function Pre 16.7 %   Health/Function Post 18.03 %   Health/Function % Change 7.96 %   Socioeconomic Pre 10.43 %   Socioeconomic Post 12.57 %   Socioeconomic % Change  20.52 %   Psych/Spiritual Pre 8.93 %   Psych/Spiritual Post 7.5 %   Psych/Spiritual % Change -16.01 %   Family Pre 10.1 %   Family Post 18.8 %   Family % Change 86.14 %   GLOBAL Pre 12.84 %   GLOBAL Post 14.85 %   GLOBAL % Change 15.65 %       Personal Goals: Goals established at orientation with interventions provided to work toward goal.     Personal Goals and Risk Factors at Admission - 09/18/16 1455      Core Components/Risk Factors/Patient Goals on Admission    Weight Management Weight Maintenance   Improve shortness of breath with ADL's Yes   Intervention Provide education, individualized exercise plan and daily activity instruction to help decrease symptoms of SOB with activities of daily living.   Expected Outcomes Short Term: Achieves a reduction of symptoms when performing activities of daily living.   Personal Goal Other Yes   Personal Goal Keep exercising, Be able to get up and do things more regularily.   Intervention Attend CR 3xweek and supplement at home 2xweek.   Expected Outcomes Achieve personal goals       Personal Goals Discharge:     Goals and Risk Factor Review    Row Name 09/18/16 1457 10/10/16 1357 10/29/16 1508 11/23/16 1420 12/14/16 1427     Core Components/Risk Factors/Patient Goals Review   Personal Goals Review Weight Management/Obesity;Improve shortness of breath with ADL's;Stress Weight Management/Obesity;Improve shortness of breath with ADL's  Be able to get up and do things; keep excercising. Weight Management/Obesity  Be able to get up and do things. Keep exercising.  Weight Management/Obesity;Improve shortness of breath with ADL's Weight Management/Obesity  Be able to get up and do things; keep exercising.   Review  - Patient has completed 9 sessions gaining 7.2 lbs. Patient says she is walking better and feels more stable on her feet. She says she feels stronger. We contacted her cardiologist d/t at 10 lbs weight gain in 2 weeks with periorbital edema. She say PA today. Her Lasix dosage was adjusted and she was put on a low Na diet. She is to f/u in 2 months. Will continue to monitor.  Patient has completed 16 sessions gaining 10 lbs. Her weight continues to fluctuate. She seen her  cardiologist 10/25/16 d/t weight gain and edema and increased SOB. He ordered labs and an  echo and referred her to Dr. Luan Pulling for evaluation of her COPD. She has an appointment to see him later this month. She continues to have periorbital edema and wheezing. She continues to say the program has helped her stability and balance. Will continue to monitor.  Patient has completed 27 sessions gaining 5 lbs. Her weight continues to fluctuates. She was evaluated by Dr. Luan Pulling since her last ITP and he added an inhaler. She says her SOB has improved some but the humidity increases the SOB. She continues to do well in the program with some progression. She says her SOB has improved some and she feels stronger. Will continue to monitor.  Patient graduated with 36 sessions losing 4.9 lbs and also losing inches in her waist and hip measurements. Her exit measurements improved in flexibility and balance. She says she feels better overall since she started the program and is doing more at home. She says her SOB has also improved. She says she plans to continue exercising at the [facility].    Expected Outcomes  - Patient will complete the program meeting her personal goals.  Patient will complete the program meeting her personal goals.  Patient will complete the program and continue to meet her personal goals.  Patient will continue to exercise and continue to meet her personal goals.       Nutrition & Weight - Outcomes:     Pre Biometrics - 09/18/16 1434      Pre Biometrics   Height 5\' 2"  (1.575 m)   Weight 148 lb 2.4 oz (67.2 kg)   Waist Circumference 36 inches   Hip Circumference 39 inches   Waist to Hip Ratio 0.92 %   BMI (Calculated) 27.2   Triceps Skinfold 15 mm   % Body Fat 36.7 %   Grip Strength 48.4 kg   Flexibility 14.75 in   Single Leg Stand 12.5 seconds         Post Biometrics - 12/14/16 1413       Post  Biometrics   Height 5\' 2"  (1.575 m)   Weight 143 lb 11.8 oz (65.2 kg)   Waist  Circumference 32 inches   Hip Circumference 38 inches   Waist to Hip Ratio 0.84 %   BMI (Calculated) 26.28   Triceps Skinfold 16 mm   % Body Fat 35.1 %   Grip Strength 47.27 kg   Flexibility 16.5 in   Single Leg Stand 15 seconds      Nutrition:   Nutrition Discharge:     Nutrition Assessments - 12/14/16 1426      MEDFICTS Scores   Pre Score 45   Post Score 57   Score Difference 12      Education Questionnaire Score:     Knowledge Questionnaire Score - 12/14/16 1426      Knowledge Questionnaire Score   Pre Score 20/24   Post Score 23/24      Goals reviewed with patient; copy given to patient.

## 2016-12-14 NOTE — Progress Notes (Signed)
Cardiac Individual Treatment Plan  Patient Details  Name: [name] [name] MRN: [id number] Date of Birth: 1941-02-09 Referring Provider:     [facility] from 09/18/2016 in [place]  Referring Provider  Dr. [name]      Initial Encounter Date:    CARDIAC REHAB PHASE II ORIENTATION from 09/18/2016 in [place]  Date  09/18/16  Referring Provider  Dr. [name]      Visit Diagnosis: S/P mitral valve repair  Patient's Home Medications on Admission:  Current Outpatient Prescriptions:  .  acetaminophen (TYLENOL) 325 MG tablet, Take 650 mg by mouth every 6 (six) hours as needed for pain., Disp: , Rfl:  .  albuterol (PROVENTIL HFA;VENTOLIN HFA) 108 (90 BASE) MCG/ACT inhaler, Inhale 2 puffs into the lungs every 6 (six) hours as needed for wheezing., Disp: , Rfl:  .  ALPRAZolam (XANAX) 0.5 MG tablet, Take 0.5 mg by mouth 2 (two) times daily as needed (for anxiety/sleep (scheduled at bedtime)). , Disp: , Rfl:  .  aspirin EC 81 MG EC tablet, Take 1 tablet (81 mg total) by mouth daily., Disp: , Rfl:  .  calcium carbonate (OS-CAL) 600 MG TABS tablet, Take 600 mg by mouth 2 (two) times daily with a meal., Disp: , Rfl:  .  Coenzyme Q10 100 MG TABS, Take 100 mg by mouth every evening. , Disp: , Rfl:  .  docusate sodium (COLACE) 100 MG capsule, Take 200 mg by mouth daily as needed for mild constipation. , Disp: , Rfl:  .  furosemide (LASIX) 40 MG tablet, Take 1 tablet (40 mg total) by mouth 2 (two) times daily., Disp: 30 tablet, Rfl: 1 .  hydrocortisone (ANUSOL-HC) 2.5 % rectal cream, Place 1 application rectally 2 (two) times daily. FOR UP TO 10 DAYS AT A TIME (Patient taking differently: Place 1 application rectally 2 (two) times daily as needed for hemorrhoids or itching. FOR UP TO 10 DAYS AT A TIME), Disp: 30 g, Rfl: 1 .  metoprolol succinate (TOPROL-XL) 25 MG 24 hr tablet, Take 1 tablet (25 mg total) by mouth 2 (two) times daily.,  Disp: 60 tablet, Rfl: 3 .  Multiple Vitamin (MULTIVITAMIN) tablet, Take 1 tablet by mouth daily.  , Disp: , Rfl:  .  omeprazole (PRILOSEC) 20 MG capsule, Take 1 capsule (20 mg total) by mouth daily., Disp: 30 capsule, Rfl: 3 .  PARoxetine (PAXIL) 20 MG tablet, Take 20 mg by mouth daily. , Disp: , Rfl:  .  Polyethyl Glycol-Propyl Glycol (SYSTANE ULTRA) 0.4-0.3 % SOLN, Place 1 drop into both eyes 3 (three) times daily., Disp: , Rfl:  .  potassium chloride SA (K-DUR,KLOR-CON) 20 MEQ tablet, Take 20 mEq by mouth 2 (two) times daily., Disp: , Rfl:  .  sodium chloride (OCEAN) 0.65 % SOLN nasal spray, Place 1 spray into both nostrils as needed for congestion., Disp: , Rfl:  .  traMADol (ULTRAM) 50 MG tablet, Take 50 mg by mouth every 4-6 hours PRN severe pain., Disp: 30 tablet, Rfl: 0 .  umeclidinium-vilanterol (ANORO ELLIPTA) 62.5-25 MCG/INH AEPB, Inhale 1 puff into the lungs daily., Disp: 30 each, Rfl: 6 .  warfarin (COUMADIN) 2.5 MG tablet, Take 1 tablet daily except 1 1/2 tablets on Sundays, Tuesdays and Thursdays, Disp: 60 tablet, Rfl: 3  Past Medical History: Past Medical History:  Diagnosis Date  . Anxiety   . Arthritis   . Asthma   . Atrial fibrillation, persistent ([place])   . Chronic  diastolic congestive heart failure ([place])   . Colon adenoma   . Colon cancer ([place])    status post low anterior resection, limited stage disease requiring no adjuvant therapy  . COPD (chronic obstructive pulmonary disease) ([place])   . Depression   . Diverticulosis   . DM (dermatomyositis)   . DVT (deep venous thrombosis) ([name])    in leg- long time ago  . Dyspnea    with activity  . Dysrhythmia   . Esophageal dysphagia   . GERD (gastroesophageal reflux disease)   . Headache   . Heart murmur   . Hematuria   . Hemorrhoids   . Hiatal hernia   . History of kidney stones    x 2  . Hypercholesterolemia   . Incidental pulmonary nodule 05/08/2016   8 mm vague opacity RML noted on CT scan  . Mitral  regurgitation   . PONV (postoperative nausea and vomiting) 2003 ish    with breast biopsy  . S/P minimally invasive maze operation for atrial fibrillation 07/05/2016   Complete bilateral atrial lesion set using cryothermy and bipolar radiofrequency ablation with clipping of LA appendage via right mini thoracotomy approach  . 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 Schatzki's ring   . Tricuspid regurgitation     Tobacco Use: History  Smoking Status  . Never Smoker  Smokeless Tobacco  . Never Used    Labs: Recent Review Flowsheet Data    Labs for ITP Cardiac and Pulmonary Rehab Latest Ref Rng & Units 07/05/2016 07/06/2016 07/06/2016 07/06/2016 07/06/2016   Hemoglobin A1c 4.8 - 5.6 % - - - - -   PHART 7.350 - 7.450 7.278(L) 7.288(L) 7.246(L) 7.330(L) -   PCO2ART 32.0 - 48.0 mmHg 54.1(H) 55.1(H) 63.4(H) 46.7 -   HCO3 20.0 - 28.0 mmol/L 25.4 26.4 26.1 24.9 -   TCO2 0 - 100 mmol/L _0 ACIDBASEDEF 0.0 - 2.0 mmol/L 2.0 1.0 - 1.0 -   O2SAT % 96.0 98.0 98.0 97.0 -      Capillary Blood Glucose: Lab Results  Component Value Date   GLUCAP 119 (H) 07/09/2016   GLUCAP 110 (H) 07/08/2016   GLUCAP 98 07/08/2016   GLUCAP 97 07/08/2016   GLUCAP 109 (H) 07/08/2016     Exercise Target Goals:    Exercise Program Goal: Individual exercise prescription set with THRR, safety & activity barriers. Participant demonstrates ability to understand and report RPE using BORG scale, to self-measure pulse accurately, and to acknowledge the importance of the exercise prescription.  Exercise Prescription Goal: Starting with aerobic activity 30 plus minutes a day, 3 days per week for initial exercise prescription. Provide home exercise prescription and guidelines that participant acknowledges understanding prior to  discharge.  Activity Barriers & Risk Stratification:   6 Minute Walk:     6 Minute Walk    Row Name 09/18/16 1432 12/14/16 1411       6 Minute Walk   Phase Initial Discharge    Distance 1200 feet 1450 feet    Distance % Change 0 % 20.83 %    Walk Time 6 minutes 60 minutes    # of Rest Breaks 0 0    MPH 2.27 2.74    METS 2.74 31    RPE 11 12    Perceived Dyspnea  13 11  VO2 Peak 9.04 9    Symptoms No No    Resting HR 74 bpm 76 bpm    Resting BP 122/64 120/62    Max Ex. HR 114 bpm 63 bpm    Max Ex. BP 146/70 160/76    2 Minute Post BP 130/60 130/70       Oxygen Initial Assessment:   Oxygen Re-Evaluation:   Oxygen Discharge (Final Oxygen Re-Evaluation):   Initial Exercise Prescription:     Initial Exercise Prescription - 09/18/16 1400      Date of Initial Exercise RX and Referring Provider   Date 09/18/16   Referring Provider Dr. [name]     Treadmill   MPH 1.3   Grade 0   Minutes 15   METs 1.9     NuStep   Level 2   SPM 10   Minutes 20   METs 1.7     Prescription Details   Frequency (times per week) 3   Duration Progress to 30 minutes of continuous aerobic without signs/symptoms of physical distress     Intensity   THRR 40-80% of Max Heartrate 102-116-130   Ratings of Perceived Exertion 11-13   Perceived Dyspnea 0-4     Progression   Progression Continue progressive overload as per policy without signs/symptoms or physical distress.     Resistance Training   Training Prescription Yes   Weight 1   Reps 10-15      Perform Capillary Blood Glucose checks as needed.  Exercise Prescription Changes:      Exercise Prescription Changes    Row Name 09/24/16 1400 10/04/16 1500 10/23/16 0700 11/12/16 1400 12/04/16 0700     Response to Exercise   Blood Pressure (Admit) 128/66 120/66 132/62 138/62 134/60   Blood Pressure (Exercise) 126/68 136/68 138/70 146/72 130/64   Blood Pressure (Exit) 126/64 126/64 148/70 134/66 130/70   Heart Rate  (Admit) 89 bpm 70 bpm 51 bpm 64 bpm 64 bpm   Heart Rate (Exercise) 88 bpm 76 bpm 65 bpm 77 bpm 82 bpm   Heart Rate (Exit) 87 bpm 76 bpm 60 bpm 72 bpm 70 bpm   Rating of Perceived Exertion (Exercise) _0 Duration Progress to 30 minutes of  aerobic without signs/symptoms of physical distress Progress to 30 minutes of  aerobic without signs/symptoms of physical distress Progress to 30 minutes of  aerobic without signs/symptoms of physical distress Progress to 30 minutes of  aerobic without signs/symptoms of physical distress Progress to 30 minutes of  aerobic without signs/symptoms of physical distress   Intensity _1      Progression   Progression Continue to progress workloads to maintain intensity without signs/symptoms of physical distress. Continue to progress workloads to maintain intensity without signs/symptoms of physical distress. Continue to progress workloads to maintain intensity without signs/symptoms of physical distress. Continue to progress workloads to maintain intensity without signs/symptoms of physical distress. Continue to progress workloads to maintain intensity without signs/symptoms of physical distress.     Resistance Training   Training Prescription _2    Weight _3 Reps 10-15 10-15 10-15 10-15 10-15     Treadmill   MPH 1.3 1.4 1.4 1.4 1.5   Grade 0 0 0 0 0   Minutes _4 METs 1._5 2.1     NuStep   Level 2 2  _0 SPM _1 84   Minutes _2 METs 3.43 3.45 3.48 1.9 1.9     Home Exercise Plan   Plans to continue exercise at Home (comment) Home (comment) Home (comment) Home (comment) Home (comment)   Frequency Add 2 additional days to program exercise sessions. Add 2 additional days to program exercise sessions. Add 2 additional days to program exercise sessions. Add 2 additional days to program exercise sessions. Add 2  additional days to program exercise sessions.      Exercise Comments:      Exercise Comments    Row Name 09/24/16 1436 10/04/16 1538 10/23/16 0746 11/12/16 1415 12/04/16 0750   Exercise Comments Patient has just started CR and will be progressed in time.  Patient is doing well in CR.  Patient is doing very well in CR.  Patient is doing well in CR. She has maintained her levels but hasn ot progressed due to wheezing and substantial weight gain Patient is slowly progressing in CR.       Exercise Goals and Review:      Exercise Goals    Row Name 09/18/16 1454             Exercise Goals   Increase Physical Activity Yes       Intervention Provide advice, education, support and counseling about physical activity/exercise needs.;Develop an individualized exercise prescription for aerobic and resistive training based on initial evaluation findings, risk stratification, comorbidities and participant's personal goals.       Expected Outcomes Achievement of increased cardiorespiratory fitness and enhanced flexibility, muscular endurance and strength shown through measurements of functional capacity and personal statement of participant.       Increase Strength and Stamina Yes       Intervention Provide advice, education, support and counseling about physical activity/exercise needs.;Develop an individualized exercise prescription for aerobic and resistive training based on initial evaluation findings, risk stratification, comorbidities and participant's personal goals.       Expected Outcomes Achievement of increased cardiorespiratory fitness and enhanced flexibility, muscular endurance and strength shown through measurements of functional capacity and personal statement of participant.          Exercise Goals Re-Evaluation :     Exercise Goals Re-Evaluation    Row Name 10/10/16 1401 10/29/16 1514 11/23/16 1423 12/14/16 1430       Exercise Goal Re-Evaluation   Exercise Goals Review  Increase Physical Activity;Increase Strenth and Stamina Increase Physical Activity;Increase Strenth and Stamina Increase Physical Activity;Increase Strenth and Stamina  Be able to get up and do things and keep exercising.  Increase Physical Activity;Increase Strenth and Stamina    Comments Patient has completed 9 sessions with some progression. She says she feels stronger and is able to walk better since she started. Will continue to monitor.  Patient has completed 15 sessions with some progression in her speed on treadmill, weights and NuStep Level. She says she feels stronger but has not increased her activity d/t her SOB which she attributes to humid weather worsening her COPD. Will continue to monitor. Patient has completed 27 sessions with some progression in her treadmill speed since her last ITP. She has not been able to progress more d/t her SOB. Will continue to monitor.  Patient graduated with 36 sessions. She progressed well in the program and reports feeling stronger and having more endurance. Her exit walk test improved by 20.83%. [facility]    Expected Outcomes Patient will complete the program with increased strength, stamina, and activity. Patient will complete the program with increased strength, stamina, and activity. Patient will complete the program with continued increased strength, stamina, and activity.  Patient will continue to exercise with continued increased strength, stamina, and activity.        Discharge Exercise Prescription (Final Exercise Prescription Changes):     Exercise Prescription Changes - 12/04/16 0700      Response to Exercise   Blood Pressure (Admit) 134/60   Blood Pressure (Exercise) 130/64   Blood Pressure (Exit) 130/70   Heart Rate (Admit) 64 bpm   Heart Rate (Exercise) 82 bpm   Heart Rate (Exit) 70 bpm   Rating of Perceived Exertion (Exercise) 11   Duration Progress to 30 minutes of  aerobic without signs/symptoms  of physical distress   Intensity THRR unchanged     Progression   Progression Continue to progress workloads to maintain intensity without signs/symptoms of physical distress.     Resistance Training   Training Prescription Yes   Weight 2   Reps 10-15     Treadmill   MPH 1.5   Grade 0   Minutes 15   METs 2.1     NuStep   Level 3   SPM 84   Minutes 20   METs 1.9     Home Exercise Plan   Plans to continue exercise at Home (comment)   Frequency Add 2 additional days to program exercise sessions.      Nutrition:  Target Goals: Understanding of nutrition guidelines, daily intake of sodium <1595m, cholesterol <2026m calories 30% from fat and 7% or less from saturated fats, daily to have 5 or more servings of fruits and vegetables.  Biometrics:     Pre Biometrics - 09/18/16 1434      Pre Biometrics   Height _0  (1.575 m)   Weight 148 lb 2.4 oz (67.2 kg)   Waist Circumference 36 inches   Hip Circumference 39 inches   Waist to Hip Ratio 0.92 %   BMI (Calculated) 27.2   Triceps Skinfold 15 mm   % Body Fat 36.7 %   Grip Strength 48.4 kg   Flexibility 14.75 in   Single Leg Stand 12.5 seconds         Post Biometrics - 12/14/16 1413       Post  Biometrics   Height _1  (1.575 m)   Weight 143 lb 11.8 oz (65.2 kg)   Waist Circumference 32 inches   Hip Circumference 38 inches   Waist to Hip Ratio 0.84 %   BMI (Calculated) 26.28   Triceps Skinfold 16 mm   % Body Fat 35.1 %   Grip Strength 47.27 kg   Flexibility 16.5 in   Single Leg Stand 15 seconds      Nutrition Therapy Plan and Nutrition Goals:   Nutrition Discharge: Rate Your Plate Scores:     Nutrition Assessments - 12/14/16 1426      MEDFICTS Scores   Pre Score 45   Post Score 57   Score Difference 12      Nutrition Goals Re-Evaluation:   Nutrition Goals Discharge (Final Nutrition Goals Re-Evaluation):   Psychosocial: Target Goals: Acknowledge presence or absence of significant  depression and/or stress, maximize coping skills, provide positive support system. Participant is able to verbalize types and ability to use techniques and skills needed for reducing stress and depression.  Initial Review &  Psychosocial Screening:     Initial Psych Review & Screening - 09/18/16 1457      Initial Review   Current issues with Current Depression     Family Dynamics   Good Support System? Yes     Barriers   Psychosocial barriers to participate in program The patient should benefit from training in stress management and relaxation.;Psychosocial barriers identified (see note)     Screening Interventions   Interventions Encouraged to exercise;Program counselor consult      Quality of Life Scores:     Quality of Life - 12/14/16 1414      Quality of Life Scores   Health/Function Pre 16.7 %   Health/Function Post 18.03 %   Health/Function % Change 7.96 %   Socioeconomic Pre 10.43 %   Socioeconomic Post 12.57 %   Socioeconomic % Change  20.52 %   Psych/Spiritual Pre 8.93 %   Psych/Spiritual Post 7.5 %   Psych/Spiritual % Change -16.01 %   Family Pre 10.1 %   Family Post 18.8 %   Family % Change 86.14 %   GLOBAL Pre 12.84 %   GLOBAL Post 14.85 %   GLOBAL % Change 15.65 %      PHQ-9: Recent Review Flowsheet Data    Depression screen [facility] 2/9 12/14/2016 09/18/2016   Decreased Interest 0 1   Down, Depressed, Hopeless 1 1   PHQ - 2 Score 1 2   Altered sleeping 1 1   Tired, decreased energy 1 1   Change in appetite 0 1   Feeling bad or failure about yourself  0 1   Trouble concentrating 1 1   Moving slowly or fidgety/restless 0 1   Suicidal thoughts 0 1   PHQ-9 Score 4 9   Difficult doing work/chores Somewhat difficult Very difficult     Interpretation of Total Score  Total Score Depression Severity:  1-4 = Minimal depression, 5-9 = Mild depression, 10-14 = Moderate depression, 15-19 = Moderately severe depression, 20-27 = Severe depression    Psychosocial Evaluation and Intervention:     Psychosocial Evaluation - 12/14/16 1433      Discharge Psychosocial Assessment & Intervention   Comments Patient's QOL score improved by 15.65% at discharge. Her exit PHQ9 score was 4. She continues taking Paxil daily and Xanax as needed. She says she does not feel like she need counseling but feels her depression is managed with medication.       Psychosocial Re-Evaluation:     Psychosocial Re-Evaluation    [place] Name 10/10/16 1402 10/29/16 1516 11/23/16 1431         Psychosocial Re-Evaluation   Current issues with Current Depression Current Depression Current Depression     Comments Patient says she has an appointment to see her pcp in July. She is going to talk to him then about counseling and other treatment options for her depression. Patient has not seen her PCP yet to talk about counseling. She continues to want to talk with him herself about treatment for her depression. Patient is currently taking Paxil 20 mg daily and Alprazolam 0.5 mg as needed for anxiety. She feels her depression is better controlled now.      Expected Outcomes Patient's QOL and PHQ-9 scores will improve or remain the same at discharge.  Patient will have improved PHQ-9 and QOL scores at discharge.  Patient will have improved QOL and PHQ-9 scores at [place]ge and no additional psychosocial issues identified.      Interventions  Stress management education;Encouraged to attend Cardiac Rehabilitation for the exercise;Relaxation education Relaxation education;Stress management education;Encouraged to attend Cardiac Rehabilitation for the exercise Encouraged to attend Cardiac Rehabilitation for the exercise;Stress management education;Relaxation education     Continue Psychosocial Services  Follow up required by staff Follow up required by staff Follow up required by staff        Psychosocial Discharge (Final Psychosocial Re-Evaluation):     Psychosocial  Re-Evaluation - 11/23/16 1431      Psychosocial Re-Evaluation   Current issues with Current Depression   Comments Patient is currently taking Paxil 20 mg daily and Alprazolam 0.5 mg as needed for anxiety. She feels her depression is better controlled now.    Expected Outcomes Patient will have improved QOL and PHQ-9 scores at dishcarge and no additional psychosocial issues identified.    Interventions Encouraged to attend Cardiac Rehabilitation for the exercise;Stress management education;Relaxation education   Continue Psychosocial Services  Follow up required by staff      Vocational Rehabilitation: Provide vocational rehab assistance to qualifying candidates.   Vocational Rehab Evaluation & Intervention:     Vocational Rehab - 09/18/16 1453      Initial Vocational Rehab Evaluation & Intervention   Assessment shows need for Vocational Rehabilitation No      Education: Education Goals: Education classes will be provided on a weekly basis, covering required topics. Participant will state understanding/return demonstration of topics presented.  Learning Barriers/Preferences:     Learning Barriers/Preferences - 09/18/16 1452      Learning Barriers/Preferences   Learning Barriers None   Learning Preferences Group Instruction;Skilled Demonstration;Verbal Instruction;Video;Pictoral      Education Topics: Hypertension, Hypertension Reduction -Define heart disease and high blood pressure. Discus how high blood pressure affects the body and ways to reduce high blood pressure.   CARDIAC REHAB PHASE II EXERCISE from 12/12/2016 in [place]  Date  11/21/16  Educator  [place]  Instruction Review Code  2- meets goals/outcomes      Exercise and Your Heart -Discuss why it is important to exercise, the FITT principles of exercise, normal and abnormal responses to exercise, and how to exercise safely.   CARDIAC REHAB PHASE II EXERCISE from 12/12/2016 in [place]  Date  11/28/16  Educator  DC  Instruction Review Code  2- meets goals/outcomes      Angina -Discuss definition of angina, causes of angina, treatment of angina, and how to decrease risk of having angina.   CARDIAC REHAB PHASE II EXERCISE from 12/12/2016 in [place]  Date  12/05/16  Educator  DC  Instruction Review Code  2- meets goals/outcomes      Cardiac Medications -Review what the following cardiac medications are used for, how they affect the body, and side effects that may occur when taking the medications.  Medications include Aspirin, Beta blockers, calcium channel blockers, ACE Inhibitors, angiotensin receptor blockers, diuretics, digoxin, and antihyperlipidemics.   CARDIAC REHAB PHASE II EXERCISE from 12/12/2016 in [place]  Date  12/12/16  Educator  DJ  Instruction Review Code  2- meets goals/outcomes      Congestive Heart Failure -Discuss the definition of CHF, how to live with CHF, the signs and symptoms of CHF, and how keep track of weight and sodium intake.   Heart Disease and Intimacy -Discus the effect sexual activity has on the heart, how changes occur during intimacy as we age, and safety during sexual activity.   CARDIAC REHAB PHASE  II EXERCISE from 12/12/2016 in [place]  Date  09/26/16  Educator  DJ  Instruction Review Code  2- meets goals/outcomes      Smoking Cessation / COPD -Discuss different methods to quit smoking, the health benefits of quitting smoking, and the definition of COPD.   CARDIAC REHAB PHASE II EXERCISE from 12/12/2016 in [place]  Date  10/03/16  Educator  Russella Dar  Instruction Review Code  2- meets goals/outcomes      Nutrition I: Fats -Discuss the types of cholesterol, what cholesterol does to the heart, and how cholesterol levels can be controlled.   CARDIAC REHAB PHASE II EXERCISE from 12/12/2016 in  [place]  Date  10/10/16  Educator  DC  Instruction Review Code  2- meets goals/outcomes      Nutrition II: Labels -Discuss the different components of food labels and how to read food label   CARDIAC REHAB PHASE II EXERCISE from 12/12/2016 in [place]  Date  10/17/16  Educator  DC  Instruction Review Code  2- meets goals/outcomes      Heart Parts and Heart Disease -Discuss the anatomy of the heart, the pathway of blood circulation through the heart, and these are affected by heart disease.   CARDIAC REHAB PHASE II EXERCISE from 12/12/2016 in [place]  Date  10/26/16  Educator  DC  Instruction Review Code  2- meets goals/outcomes      Stress I: Signs and Symptoms -Discuss the causes of stress, how stress may lead to anxiety and depression, and ways to limit stress.   CARDIAC REHAB PHASE II EXERCISE from 12/12/2016 in [place]  Date  10/31/16  Educator  [place]  Instruction Review Code  2- meets goals/outcomes      Stress II: Relaxation -Discuss different types of relaxation techniques to limit stress.   CARDIAC REHAB PHASE II EXERCISE from 12/12/2016 in [place]  Date  11/07/16  Educator  [place]  Instruction Review Code  2- meets goals/outcomes      Warning Signs of Stroke / TIA -Discuss definition of a stroke, what the signs and symptoms are of a stroke, and how to identify when someone is having stroke.   CARDIAC REHAB PHASE II EXERCISE from 12/12/2016 in [place]  Date  11/14/16  Educator  DC  Instruction Review Code  2- meets goals/outcomes      Knowledge Questionnaire Score:     Knowledge Questionnaire Score - 12/14/16 1426      Knowledge Questionnaire Score   Pre Score 20/24   Post Score 23/24      Core Components/Risk Factors/Patient Goals at Admission:     Personal Goals and Risk Factors at Admission -  09/18/16 1455      Core Components/Risk Factors/Patient Goals on Admission    Weight Management Weight Maintenance   Improve shortness of breath with ADL's Yes   Intervention Provide education, individualized exercise plan and daily activity instruction to help decrease symptoms of SOB with activities of daily living.   Expected Outcomes Short Term: Achieves a reduction of symptoms when performing activities of daily living.   Personal Goal Other Yes   Personal Goal Keep exercising, Be able to get up and do things more regularily.   Intervention Attend CR 3xweek and supplement at home 2xweek.   Expected Outcomes Achieve personal goals      Core Components/Risk Factors/Patient Goals Review:  Goals and Risk Factor Review    Row Name 09/18/16 1457 10/10/16 1357 10/29/16 1508 11/23/16 1420 12/14/16 1427     Core Components/Risk Factors/Patient Goals Review   Personal Goals Review Weight Management/Obesity;Improve shortness of breath with ADL's;Stress Weight Management/Obesity;Improve shortness of breath with ADL's  Be able to get up and do things; keep excercising. Weight Management/Obesity  Be able to get up and do things. Keep exercising.  Weight Management/Obesity;Improve shortness of breath with ADL's Weight Management/Obesity  Be able to get up and do things; keep exercising.   Review  - Patient has completed 9 sessions gaining 7.2 lbs. Patient says she is walking better and feels more stable on her feet. She says she feels stronger. We contacted her cardiologist d/t at 10 lbs weight gain in 2 weeks with periorbital edema. She say PA today. Her Lasix dosage was adjusted and she was put on a low Na diet. She is to f/u in 2 months. Will continue to monitor.  Patient has completed 16 sessions gaining 10 lbs. Her weight continues to fluctuate. She seen her cardiologist 10/25/16 d/t weight gain and edema and increased SOB. He ordered labs and an echo and referred her to Dr. Luan Pulling for  evaluation of her COPD. She has an appointment to see him later this month. She continues to have periorbital edema and wheezing. She continues to say the program has helped her stability and balance. Will continue to monitor.  Patient has completed 27 sessions gaining 5 lbs. Her weight continues to fluctuates. She was evaluated by Dr. Luan Pulling since her last ITP and he added an inhaler. She says her SOB has improved some but the humidity increases the SOB. She continues to do well in the program with some progression. She says her SOB has improved some and she feels stronger. Will continue to monitor.  Patient graduated with 36 sessions losing 4.9 lbs and also losing inches in her waist and hip measurements. Her exit measurements improved in flexibility and balance. She says she feels better overall since she started the program and is doing more at home. She says her SOB has also improved. She says she plans to continue exercising at the [facility].    Expected Outcomes  - Patient will complete the program meeting her personal goals.  Patient will complete the program meeting her personal goals.  Patient will complete the program and continue to meet her personal goals.  Patient will continue to exercise and continue to meet her personal goals.       Core Components/Risk Factors/Patient Goals at Discharge (Final Review):      Goals and Risk Factor Review - 12/14/16 1427      Core Components/Risk Factors/Patient Goals Review   Personal Goals Review Weight Management/Obesity  Be able to get up and do things; keep exercising.   Review Patient graduated with 36 sessions losing 4.9 lbs and also losing inches in her waist and hip measurements. Her exit measurements improved in flexibility and balance. She says she feels better overall since she started the program and is doing more at home. She says her SOB has also improved. She says she plans to continue exercising at the [facility].    Expected Outcomes Patient will  continue to exercise and continue to meet her personal goals.       ITP Comments:     ITP Comments    Row Name 09/27/16 1244           ITP Comments Patient met  with Registered Dietitian to discuss nutrition topics including: Heart healthty eating, heart healthy cooking and making smart choices when shopping; Portion control; weight management; and hydration. Patient attended a group session with the hospital chaplian called [facility] to discuss and share how this recent diagnosis has effected their life          Comments: Patient graduated from [facility] today on 12/14/16 after completing 36 sessions. She achieved LTG of 30 minutes of aerobic exercise at Max Met level of 2.1. All patients vitals are WNL. Patient has met with dietician. Discharge instruction has been reviewed in detail and patient stated an understanding of material given. Patient plans to continue exercising at the [facility]. Cardiac Rehab staff will make f/u calls at 1 month, 6 months, and 1 year. Patient had no complaints of any abnormal S/S or pain on their exit visit.

## 2016-12-14 NOTE — Progress Notes (Signed)
Daily Session Note  Patient Details  Name: [name] [name] MRN: [id number] Date of Birth: [birth year] Referring Provider:     CARDIAC REHAB PHASE II ORIENTATION from 09/18/2016 in [place]  Referring Provider  Dr. [name]      Encounter Date: 12/14/2016  Check In:     Session Check In - 12/14/16 0810      Check-In   Location [facility]   Staff Present Aundra Dubin, RN, BSN;Kaheem Halleck Luther Parody, BS, EP, Exercise Physiologist   Supervising physician immediately available to respond to emergencies See telemetry face sheet for immediately available MD   Medication changes reported     No   Fall or balance concerns reported    No   Warm-up and Cool-down Performed as group-led instruction   Resistance Training Performed Yes   VAD Patient? No     Pain Assessment   Currently in Pain? No/denies   Pain Score 0-No pain   Multiple Pain Sites No      Capillary Blood Glucose: Results for orders placed or performed in visit on 12/13/16 (from the past 24 hour(s))  POCT INR     Status: Normal   Collection Time: 12/13/16  9:09 AM  Result Value Ref Range   INR 2.3       History  Smoking Status  . Never Smoker  Smokeless Tobacco  . Never Used    Goals Met:  Independence with exercise equipment Exercise tolerated well No report of cardiac concerns or symptoms Strength training completed today  Goals Unmet:  Not Applicable  Comments: Check out 915   Dr. Kate Sable is Medical Director for [facility] and Pulmonary Rehab.

## 2016-12-18 ENCOUNTER — Ambulatory Visit: Payer: Medicare Other | Admitting: Nurse Practitioner

## 2016-12-19 DIAGNOSIS — J449 Chronic obstructive pulmonary disease, unspecified: Secondary | ICD-10-CM | POA: Diagnosis not present

## 2016-12-19 DIAGNOSIS — J301 Allergic rhinitis due to pollen: Secondary | ICD-10-CM | POA: Diagnosis not present

## 2016-12-19 DIAGNOSIS — I5030 Unspecified diastolic (congestive) heart failure: Secondary | ICD-10-CM | POA: Diagnosis not present

## 2016-12-19 DIAGNOSIS — I10 Essential (primary) hypertension: Secondary | ICD-10-CM | POA: Diagnosis not present

## 2016-12-20 ENCOUNTER — Ambulatory Visit (INDEPENDENT_AMBULATORY_CARE_PROVIDER_SITE_OTHER): Payer: Medicare Other | Admitting: Cardiology

## 2016-12-20 ENCOUNTER — Other Ambulatory Visit (HOSPITAL_COMMUNITY)
Admission: RE | Admit: 2016-12-20 | Discharge: 2016-12-20 | Disposition: A | Discharge status: Home / Self Care | Payer: Medicare Other | Source: Ambulatory Visit | Attending: Cardiology | Admitting: Cardiology

## 2016-12-20 ENCOUNTER — Encounter: Payer: Self-pay | Admitting: Cardiology

## 2016-12-20 VITALS — BP 126/82 | HR 84 | Ht 62.0 in | Wt 145.0 lb

## 2016-12-20 DIAGNOSIS — I4891 Unspecified atrial fibrillation: Secondary | ICD-10-CM | POA: Diagnosis not present

## 2016-12-20 DIAGNOSIS — Z79899 Other long term (current) drug therapy: Secondary | ICD-10-CM | POA: Diagnosis not present

## 2016-12-20 DIAGNOSIS — I5032 Chronic diastolic (congestive) heart failure: Secondary | ICD-10-CM | POA: Diagnosis not present

## 2016-12-20 DIAGNOSIS — Z9889 Other specified postprocedural states: Secondary | ICD-10-CM | POA: Diagnosis not present

## 2016-12-20 LAB — BASIC METABOLIC PANEL
Anion gap: 6 (ref 5–15)
BUN: 15 mg/dL (ref 6–20)
CALCIUM: 9.2 mg/dL (ref 8.9–10.3)
CHLORIDE: 99 mmol/L — AB (ref 101–111)
CO2: 34 mmol/L — ABNORMAL HIGH (ref 22–32)
CREATININE: 0.77 mg/dL (ref 0.44–1.00)
Glucose, Bld: 99 mg/dL (ref 65–99)
Potassium: 4.3 mmol/L (ref 3.5–5.1)
SODIUM: 139 mmol/L (ref 135–145)

## 2016-12-20 LAB — MAGNESIUM: MAGNESIUM: 2.1 mg/dL (ref 1.7–2.4)

## 2016-12-20 MED ORDER — POTASSIUM CHLORIDE CRYS ER 20 MEQ PO TBCR: 20.0000 meq | EXTENDED_RELEASE_TABLET | Freq: Two times a day (BID) | ORAL | 3 refills | Status: DC

## 2016-12-20 NOTE — Patient Instructions (Signed)

## 2016-12-20 NOTE — Progress Notes (Signed)
Clinical Summary [name] [name] is a 76 y.o.female seen today for follow up of the following medical problems.     1. Mitral regurgitation - echo at last [facility] Jan 2017 showed normal LVEF, 3+(modarte)eccentric MR with immobile posterior leaflet.  - repeat echo 01/2016 moderate to severe MR. LVEF 60-65%. LVIDs 31. Symptoms unchanged since last visit.  - 01/2016 TEE moderate to severe eccentric MR, severe TR.  - she is s/p mitral valve repair 07/05/16, Sorin Memo 3D Ring Annuloplasty (size 39mm, catalog #[id number], serial [id number])     - repeat echo 10/2016 with LVEF 60-65%, no WMAs, normal MV repair and ring, moderate TR.  - last visit we increased lasix to 40mg  bid due to significant weight gain and SOB  - weigght down 8 lbs since last visit. Breathing much improved - goal weight 145-150 - she has completed cardiac rehab  2.Paroxysmal afib - occasional palpiations, about once month. Lasts for a few minutes - s/p MAZE procedure during recent MV repair - no recent palpitations. Heart rates at home around 110s.   - denies any palpitations. - occasional blood in stool, no major bleeding on anticoagulatoin.   3. COPD - referred to Dr Luan Pulling   4. HTN - she remains compliant with meds   Past Medical History:  Diagnosis Date  . Anxiety   . Arthritis   . Asthma   . Atrial fibrillation, persistent ([place])   . Chronic diastolic congestive heart failure ([place])   . Colon adenoma   . Colon cancer ([place])    status post low anterior resection, limited stage disease requiring no adjuvant therapy  . COPD (chronic obstructive pulmonary disease) ([place])   . Depression   . Diverticulosis   . DM (dermatomyositis)   . DVT (deep venous thrombosis) ([name])    in leg- long time ago  . Dyspnea    with activity  . Dysrhythmia   . Esophageal dysphagia   . GERD (gastroesophageal reflux disease)   . Headache   . Heart murmur   . Hematuria   . Hemorrhoids   . Hiatal hernia   .  History of kidney stones    x 2  . Hypercholesterolemia   . Incidental pulmonary nodule 05/08/2016   8 mm vague opacity RML noted on CT scan  . Mitral regurgitation   . PONV (postoperative nausea and vomiting) 2003 ish    with breast biopsy  . S/P minimally invasive maze operation for atrial fibrillation 07/05/2016   Complete bilateral atrial lesion set using cryothermy and bipolar radiofrequency ablation with clipping of LA appendage via right mini thoracotomy approach  . 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 Schatzki's ring   . Tricuspid regurgitation      Allergies  Allergen Reactions  . Iohexol Hives and Shortness Of Breath     Pt. had a severe allergic reaction to IV contrast the last time she was injected and had to be seen in the ER.   Marland Kitchen Hydrocodone-Acetaminophen Hives  . Nabumetone Rash  . Prednisone Rash     Current Outpatient Prescriptions  Medication Sig Dispense Refill  . acetaminophen (TYLENOL) 325 MG tablet Take 650 mg by mouth every 6 (six) hours as needed for pain.    Marland Kitchen albuterol (PROVENTIL HFA;VENTOLIN HFA) 108 (90 BASE) MCG/ACT inhaler Inhale 2 puffs into the lungs every  6 (six) hours as needed for wheezing.    Marland Kitchen ALPRAZolam (XANAX) 0.5 MG tablet Take 0.5 mg by mouth 2 (two) times daily as needed (for anxiety/sleep (scheduled at bedtime)).     Marland Kitchen aspirin EC 81 MG EC tablet Take 1 tablet (81 mg total) by mouth daily.    . calcium carbonate (OS-CAL) 600 MG TABS tablet Take 600 mg by mouth 2 (two) times daily with a meal.    . Coenzyme Q10 100 MG TABS Take 100 mg by mouth every evening.     . docusate sodium (COLACE) 100 MG capsule Take 200 mg by mouth daily as needed for mild constipation.     . furosemide (LASIX) 40 MG tablet Take 1 tablet (40 mg total) by mouth 2 (two) times daily. 30 tablet 1  .  hydrocortisone (ANUSOL-HC) 2.5 % rectal cream Place 1 application rectally 2 (two) times daily. FOR UP TO 10 DAYS AT A TIME (Patient taking differently: Place 1 application rectally 2 (two) times daily as needed for hemorrhoids or itching. FOR UP TO 10 DAYS AT A TIME) 30 g 1  . metoprolol succinate (TOPROL-XL) 25 MG 24 hr tablet Take 1 tablet (25 mg total) by mouth 2 (two) times daily. 60 tablet 3  . Multiple Vitamin (MULTIVITAMIN) tablet Take 1 tablet by mouth daily.      Marland Kitchen omeprazole (PRILOSEC) 20 MG capsule Take 1 capsule (20 mg total) by mouth daily. 30 capsule 3  . PARoxetine (PAXIL) 20 MG tablet Take 20 mg by mouth daily.     Vladimir Faster Glycol-Propyl Glycol (SYSTANE ULTRA) 0.4-0.3 % SOLN Place 1 drop into both eyes 3 (three) times daily.    . potassium chloride SA (K-DUR,KLOR-CON) 20 MEQ tablet Take 20 mEq by mouth 2 (two) times daily.    . sodium chloride (OCEAN) 0.65 % SOLN nasal spray Place 1 spray into both nostrils as needed for congestion.    . traMADol (ULTRAM) 50 MG tablet Take 50 mg by mouth every 4-6 hours PRN severe pain. 30 tablet 0  . umeclidinium-vilanterol (ANORO ELLIPTA) 62.5-25 MCG/INH AEPB Inhale 1 puff into the lungs daily. 30 each 6  . warfarin (COUMADIN) 2.5 MG tablet Take 1 tablet daily except 1 1/2 tablets on Sundays, Tuesdays and Thursdays 60 tablet 3   No current facility-administered medications for this visit.      Past Surgical History:  Procedure Laterality Date  . APPENDECTOMY    . BREAST SURGERY Right 2003ish   biopsy  . CARDIAC CATHETERIZATION N/A 04/12/2016   Procedure: Right/Left Heart Cath and Coronary Angiography;  Surgeon: Leonie Man, MD;  Location: [facility];  Service: Cardiovascular;  Laterality: N/A;  . CATARACT EXTRACTION Bilateral 2017  . CLIPPING OF ATRIAL APPENDAGE  07/05/2016   Procedure: CLIPPING OF ATRIAL APPENDAGE;  Surgeon: Rexene Alberts, MD;  Location: [facility];  Service: Open Heart Surgery;;  . COLONOSCOPY  11/09   Dr.  Gala Romney- external hemorrhoidal tags o/w normal rectal mucosa, s/p surgical resection with a normal appearing anastomosis 12cm, pan colonic diverticulum  . COLONOSCOPY N/A 06/02/2012   EPP:IRJJOA post low anterior resection. Pancolonic diverticulosis. Colonic polyp-tubular adenoma. Surveillance due 2019.   Marland Kitchen COLONOSCOPY N/A 10/13/2015   Procedure: COLONOSCOPY;  Surgeon: Daneil Dolin, MD;  Location[name];  Service: Endoscopy;  Laterality: N/A;  0930  . ESOPHAGOGASTRODUODENOSCOPY  07/2007   Dr. Daiva Nakayama cervical esophageal web, schatzki ring, large hiatal hernia  . INGUNAL HERNIA REPAIR Left   .  LOW ANTERIOR BOWEL RESECTION     NO ADJ CHEMO  . MINIMALLY INVASIVE MAZE PROCEDURE N/A 07/05/2016   Procedure: MINIMALLY INVASIVE MAZE PROCEDURE;  Surgeon: Rexene Alberts, MD;  Location: [place];  Service: Open Heart Surgery;  Laterality: N/A;  . MITRAL VALVE REPAIR Right 07/05/2016   Procedure: MINIMALLY INVASIVE MITRAL VALVE REPAIR (MVR);  Surgeon: Rexene Alberts, MD;  Location: [facility];  Service: Open Heart Surgery;  Laterality: Right;  . MULTIPLE EXTRACTIONS WITH ALVEOLOPLASTY N/A 05/21/2016   Procedure: MULTIPLE EXTRACTION OF TOOTH #'S 5, 21 WITH ALVEOLOPLASTY AND GROSS DEBRIDEMENT OF TEETH;  Surgeon: Lenn Cal, DDS;  Location: [place];  Service: Oral Surgery;  Laterality: N/A;  . TEE WITHOUT CARDIOVERSION N/A 02/20/2016   Procedure: TRANSESOPHAGEAL ECHOCARDIOGRAM (TEE);  Surgeon: Arnoldo Lenis, MD;  [name];  Service: Endoscopy;  Laterality: N/A;  . TEE WITHOUT CARDIOVERSION N/A 07/05/2016   Procedure: TRANSESOPHAGEAL ECHOCARDIOGRAM (TEE);  Surgeon: Rexene Alberts, MD;  Location: [place];  Service: Open Heart Surgery;  Laterality: N/A;  . TOOTH EXTRACTION    . TUBAL LIGATION    . VEIN LIGATION AND STRIPPING       Allergies  Allergen Reactions  . Iohexol Hives and Shortness Of Breath     Pt. had a severe allergic reaction to IV contrast the last time she was  injected and had to be seen in the ER.   Marland Kitchen Hydrocodone-Acetaminophen Hives  . Nabumetone Rash  . Prednisone Rash      Family History  Problem Relation Age of Onset  . Breast cancer Mother   . Rheum arthritis Father   . Cancer - Lung Sister   . Brain cancer Sister   . Prostate cancer Brother   . Aneurysm Brother   . Colon cancer Neg Hx      Social History Ms. Neis reports that she has never smoked. She has never used smokeless tobacco. Ms. Scholes reports that she does not drink alcohol.   Review of Systems CONSTITUTIONAL: No weight loss, fever, chills, weakness or fatigue.  HEENT: Eyes: No visual loss, blurred vision, double vision or yellow sclerae.No hearing loss, sneezing, congestion, runny nose or sore throat.  SKIN: No rash or itching.  CARDIOVASCULAR: per hpi RESPIRATORY: No shortness of breath, cough or sputum.  GASTROINTESTINAL: No anorexia, nausea, vomiting or diarrhea. No abdominal pain or blood.  GENITOURINARY: No burning on urination, no polyuria NEUROLOGICAL: No headache, dizziness, syncope, paralysis, ataxia, numbness or tingling in the extremities. No change in bowel or bladder control.  MUSCULOSKELETAL: No muscle, back pain, joint pain or stiffness.  LYMPHATICS: No enlarged nodes. No history of splenectomy.  PSYCHIATRIC: No history of depression or anxiety.  ENDOCRINOLOGIC: No reports of sweating, cold or heat intolerance. No polyuria or polydipsia.  Marland Kitchen   Physical Examination Vitals:   12/20/16 1058  BP: 126/82  Pulse: 84  SpO2: 95%   Vitals:   12/20/16 1058  Weight: 145 lb (65.8 kg)  Height: 5\' 2"  (1.575 m)    Gen: resting comfortably, no acute distress HEENT: no scleral icterus, pupils equal round and reactive, no palptable cervical adenopathy,  CV: RRR, no m/r/g, no jvd Resp: Clear to auscultation bilaterally GI: abdomen is soft, non-tender, non-distended, normal bowel sounds, no hepatosplenomegaly MSK: extremities are warm, no  edema.  Skin: warm, no rash Neuro:  no focal deficits Psych: appropriate affect   Diagnostic Studies 01/2016 echo Study Conclusions  - Left ventricle: The cavity size was normal.  Wall thickness was normal. Systolic function was normal. The estimated ejection fraction was in the range of 60% to 65%. Wall motion was normal; there were no regional wall motion abnormalities. The study is not technically sufficient to allow evaluation of LV diastolic function. Doppler parameters are consistent with high ventricular filling pressure. - Mitral valve: Calcified annulus. There was moderate to severe (closer to severe) eccentric regurgitation. - Left atrium: The atrium was severely dilated. - Right atrium: The atrium was moderately dilated. - Tricuspid valve: There was moderate-severe regurgitation. - Pulmonary arteries: PA peak pressure: 40 mm Hg (S).   01/2016 TEE eccentric moderate to severe MR. Moderate to severe TR. F/u full report for final findings.  08/22/16 echo  Study Conclusions  - Left ventricle: The cavity size was normal. Wall thickness was normal. Systolic function was normal. The estimated ejection fraction was in the range of 60% to 65%. Wall motion was normal; there were no regional wall motion abnormalities. The study is not technically sufficient to allow evaluation of LV diastolic function. - Aortic valve: Mildly calcified annulus. Trileaflet. Valve area (VTI): 1.6 cm^2. Valve area (Vmax): 1.59 cm^2. Valve area (Vmean): 1.61 cm^2. - Mitral valve: Status post complex valvuloplasty including artificial Gore-tex neochord placement and Sorin Memo 3D Ring Annuloplasty (size 49mm, catalog #[id number], serial [id number]). Mildly thickened leaflets. There was trivial regurgitation. Mean gradient (D): 4 mm Hg. - Left atrium: The atrium was mildly to moderately dilated. - Right ventricle: The cavity size was mildly dilated. - Right  atrium: The atrium was at the upper limits of normal in size. Central venous pressure (est): 3 mm Hg. - Atrial septum: No defect or patent foramen ovale was identified. - Tricuspid valve: There was moderate regurgitation. - Pulmonary arteries: Systolic pressure was moderately increased. PA peak pressure: 50 mm Hg (S). - Pericardium, extracardiac: There was no pericardial effusion.  Impressions:  - Normal LV wall thickness with LVEF 60-65%. Indeterminate diastolic function. Mild to moderate left atrial enlargement. Status post mitral valve repair and annuloplasty as indicated with mildly thickened leaflets and trivial mitral regurgitation. Mean gradient 4 mmHg. Mild right ventricular enlargement. At least moderate tricuspid regurgitation noted. Moderate pulmonary hypertension with PASP 50 mmHg.   10/2016 echo Study Conclusions  - Left ventricle: The cavity size was normal. Wall thickness was   normal. Systolic function was normal. The estimated ejection   fraction was in the range of 60% to 65%. Wall motion was normal;   there were no regional wall motion abnormalities. The study is   not technically sufficient to allow evaluation of LV diastolic   function. - Aortic valve: Moderately calcified annulus. Trileaflet. - Mitral valve: Status post complex valvuloplasty including   artificial Gore-tex neochord placement and Sorin Memo 3D Ring   Annuloplasty (size 58mm, catalog #[id number], serial [id number]). There   was trivial regurgitation. Mean gradient (D): 4 mm Hg. - Left atrium: The atrium was mildly dilated. - Right ventricle: The cavity size was mildly dilated. Systolic   function appears mildly reduced. - Right atrium: The atrium was mildly to moderately dilated. - Tricuspid valve: There was moderate regurgitation. - Pulmonary arteries: PA peak pressure: 43 mm Hg (S).   Assessment and Plan  1. Mitral regurgitation/Mitral valve repair - s/p mitral valve  repair - weight and breathing much improved with recent increase in lasix dose. Repeat echo shows stable MV repair - we will repeat BMET/Mg  2. PAF - no recent symptoms,  - continue current meds including anticoag  3. COPD - follow with Dr Luan Pulling  4. Chronic diastolic HF - symptoms improved with recent lasix increase, repeat BMET/Mg to f/u renal function and electrolytes.    F/u 6 months      Arnoldo Lenis, M.D.

## 2016-12-27 ENCOUNTER — Ambulatory Visit (INDEPENDENT_AMBULATORY_CARE_PROVIDER_SITE_OTHER): Payer: Medicare Other | Admitting: *Deleted

## 2016-12-27 DIAGNOSIS — I481 Persistent atrial fibrillation: Secondary | ICD-10-CM | POA: Diagnosis not present

## 2016-12-27 DIAGNOSIS — Z9889 Other specified postprocedural states: Secondary | ICD-10-CM

## 2016-12-27 DIAGNOSIS — I4819 Other persistent atrial fibrillation: Secondary | ICD-10-CM

## 2016-12-27 DIAGNOSIS — Z5181 Encounter for therapeutic drug level monitoring: Secondary | ICD-10-CM | POA: Diagnosis not present

## 2016-12-27 LAB — POCT INR: INR: 1.5

## 2017-01-03 ENCOUNTER — Ambulatory Visit (INDEPENDENT_AMBULATORY_CARE_PROVIDER_SITE_OTHER): Payer: Medicare Other | Admitting: *Deleted

## 2017-01-03 DIAGNOSIS — Z9889 Other specified postprocedural states: Secondary | ICD-10-CM

## 2017-01-03 DIAGNOSIS — Z5181 Encounter for therapeutic drug level monitoring: Secondary | ICD-10-CM

## 2017-01-03 DIAGNOSIS — I481 Persistent atrial fibrillation: Secondary | ICD-10-CM | POA: Diagnosis not present

## 2017-01-03 DIAGNOSIS — I4819 Other persistent atrial fibrillation: Secondary | ICD-10-CM

## 2017-01-03 LAB — POCT INR: INR: 3

## 2017-01-14 ENCOUNTER — Encounter (HOSPITAL_COMMUNITY): Payer: Self-pay | Admitting: Nurse Practitioner

## 2017-01-14 ENCOUNTER — Ambulatory Visit (HOSPITAL_COMMUNITY)
Admission: RE | Admit: 2017-01-14 | Discharge: 2017-01-14 | Disposition: A | Discharge status: Home / Self Care | Payer: Medicare Other | Source: Ambulatory Visit | Attending: Nurse Practitioner | Admitting: Nurse Practitioner

## 2017-01-14 VITALS — BP 114/64 | HR 69 | Ht 62.0 in | Wt 140.2 lb

## 2017-01-14 DIAGNOSIS — Z91041 Radiographic dye allergy status: Secondary | ICD-10-CM | POA: Insufficient documentation

## 2017-01-14 DIAGNOSIS — I340 Nonrheumatic mitral (valve) insufficiency: Secondary | ICD-10-CM | POA: Insufficient documentation

## 2017-01-14 DIAGNOSIS — F329 Major depressive disorder, single episode, unspecified: Secondary | ICD-10-CM | POA: Insufficient documentation

## 2017-01-14 DIAGNOSIS — Z85038 Personal history of other malignant neoplasm of large intestine: Secondary | ICD-10-CM | POA: Diagnosis not present

## 2017-01-14 DIAGNOSIS — F419 Anxiety disorder, unspecified: Secondary | ICD-10-CM | POA: Diagnosis not present

## 2017-01-14 DIAGNOSIS — Z888 Allergy status to other drugs, medicaments and biological substances status: Secondary | ICD-10-CM | POA: Insufficient documentation

## 2017-01-14 DIAGNOSIS — E119 Type 2 diabetes mellitus without complications: Secondary | ICD-10-CM | POA: Insufficient documentation

## 2017-01-14 DIAGNOSIS — Z7901 Long term (current) use of anticoagulants: Secondary | ICD-10-CM | POA: Diagnosis not present

## 2017-01-14 DIAGNOSIS — K219 Gastro-esophageal reflux disease without esophagitis: Secondary | ICD-10-CM | POA: Diagnosis not present

## 2017-01-14 DIAGNOSIS — I4891 Unspecified atrial fibrillation: Secondary | ICD-10-CM | POA: Diagnosis not present

## 2017-01-14 DIAGNOSIS — E7800 Pure hypercholesterolemia, unspecified: Secondary | ICD-10-CM | POA: Diagnosis not present

## 2017-01-14 DIAGNOSIS — J449 Chronic obstructive pulmonary disease, unspecified: Secondary | ICD-10-CM | POA: Diagnosis not present

## 2017-01-14 DIAGNOSIS — Z7982 Long term (current) use of aspirin: Secondary | ICD-10-CM | POA: Insufficient documentation

## 2017-01-14 DIAGNOSIS — Z9851 Tubal ligation status: Secondary | ICD-10-CM | POA: Diagnosis not present

## 2017-01-14 DIAGNOSIS — Z86718 Personal history of other venous thrombosis and embolism: Secondary | ICD-10-CM | POA: Diagnosis not present

## 2017-01-14 DIAGNOSIS — Z9889 Other specified postprocedural states: Secondary | ICD-10-CM | POA: Insufficient documentation

## 2017-01-14 DIAGNOSIS — Z955 Presence of coronary angioplasty implant and graft: Secondary | ICD-10-CM | POA: Diagnosis not present

## 2017-01-14 DIAGNOSIS — Z8042 Family history of malignant neoplasm of prostate: Secondary | ICD-10-CM | POA: Insufficient documentation

## 2017-01-14 DIAGNOSIS — Z87442 Personal history of urinary calculi: Secondary | ICD-10-CM | POA: Diagnosis not present

## 2017-01-14 DIAGNOSIS — I5032 Chronic diastolic (congestive) heart failure: Secondary | ICD-10-CM | POA: Insufficient documentation

## 2017-01-14 DIAGNOSIS — M1990 Unspecified osteoarthritis, unspecified site: Secondary | ICD-10-CM | POA: Diagnosis not present

## 2017-01-14 DIAGNOSIS — Z808 Family history of malignant neoplasm of other organs or systems: Secondary | ICD-10-CM | POA: Insufficient documentation

## 2017-01-14 DIAGNOSIS — Z8719 Personal history of other diseases of the digestive system: Secondary | ICD-10-CM | POA: Diagnosis not present

## 2017-01-14 DIAGNOSIS — Z9842 Cataract extraction status, left eye: Secondary | ICD-10-CM | POA: Diagnosis not present

## 2017-01-14 DIAGNOSIS — Z8261 Family history of arthritis: Secondary | ICD-10-CM | POA: Insufficient documentation

## 2017-01-14 DIAGNOSIS — Z885 Allergy status to narcotic agent status: Secondary | ICD-10-CM | POA: Insufficient documentation

## 2017-01-14 DIAGNOSIS — I481 Persistent atrial fibrillation: Secondary | ICD-10-CM | POA: Insufficient documentation

## 2017-01-14 DIAGNOSIS — Z9841 Cataract extraction status, right eye: Secondary | ICD-10-CM | POA: Insufficient documentation

## 2017-01-14 DIAGNOSIS — Z820 Family history of epilepsy and other diseases of the nervous system: Secondary | ICD-10-CM | POA: Insufficient documentation

## 2017-01-14 DIAGNOSIS — Z801 Family history of malignant neoplasm of trachea, bronchus and lung: Secondary | ICD-10-CM | POA: Insufficient documentation

## 2017-01-14 DIAGNOSIS — Z803 Family history of malignant neoplasm of breast: Secondary | ICD-10-CM | POA: Insufficient documentation

## 2017-01-14 DIAGNOSIS — I4819 Other persistent atrial fibrillation: Secondary | ICD-10-CM

## 2017-01-14 NOTE — Progress Notes (Signed)
Primary Care Physician: Glenda Chroman, MD Referring Physician: Dr.Owen Cardiologist: Dr. [place]   [name] [name] is a 76 y.o. female with a h/o afib, s/p mitral valve repair 07/05/16 with Dr. Roxy Manns, as well as Maze procedure and atrial appendage clipping.  She is in the afib clinic, for long term surveillance of afib s/p maze. Pt reports that she feels so much better since surgery. Shortness of breath much better, just a rare skipped beat. Continues on warfarin for a chadsvasc score of at least 5. No bleeding issues.H/o DVT.  Today, she denies symptoms of palpitations, chest pain, shortness of breath, orthopnea, PND, lower extremity edema, dizziness, presyncope, syncope, or neurologic sequela. The patient is tolerating medications without difficulties and is otherwise without complaint today.   Past Medical History:  Diagnosis Date  . Anxiety   . Arthritis   . Asthma   . Atrial fibrillation, persistent ([place])   . Chronic diastolic congestive heart failure ([place])   . Colon adenoma   . Colon cancer ([place])    status post low anterior resection, limited stage disease requiring no adjuvant therapy  . COPD (chronic obstructive pulmonary disease) ([place])   . Depression   . Diverticulosis   . DM (dermatomyositis)   . DVT (deep venous thrombosis) ([name])    in leg- long time ago  . Dyspnea    with activity  . Dysrhythmia   . Esophageal dysphagia   . GERD (gastroesophageal reflux disease)   . Headache   . Heart murmur   . Hematuria   . Hemorrhoids   . Hiatal hernia   . History of kidney stones    x 2  . Hypercholesterolemia   . Incidental pulmonary nodule 05/08/2016   8 mm vague opacity RML noted on CT scan  . Mitral regurgitation   . PONV (postoperative nausea and vomiting) 2003 ish    with breast biopsy  . S/P minimally invasive maze operation for atrial fibrillation 07/05/2016   Complete bilateral atrial lesion set using cryothermy and bipolar radiofrequency ablation with clipping of  LA appendage via right mini thoracotomy approach  . 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 Schatzki's ring   . Tricuspid regurgitation    Past Surgical History:  Procedure Laterality Date  . APPENDECTOMY    . BREAST SURGERY Right 2003ish   biopsy  . CARDIAC CATHETERIZATION N/A 04/12/2016   Procedure: Right/Left Heart Cath and Coronary Angiography;  Surgeon: Leonie Man, MD;  Location: [facility];  Service: Cardiovascular;  Laterality: N/A;  . CATARACT EXTRACTION Bilateral 2017  . CLIPPING OF ATRIAL APPENDAGE  07/05/2016   Procedure: CLIPPING OF ATRIAL APPENDAGE;  Surgeon: Rexene Alberts, MD;  Location: [facility];  Service: Open Heart Surgery;;  . COLONOSCOPY  11/09   Dr. Gala Romney- external hemorrhoidal tags o/w normal rectal mucosa, s/p surgical resection with a normal appearing anastomosis 12cm, pan colonic diverticulum  . COLONOSCOPY N/A 06/02/2012   [id number] post low anterior resection. Pancolonic diverticulosis. Colonic polyp-tubular adenoma. Surveillance due 2019.   Marland Kitchen COLONOSCOPY N/A 10/13/2015   Procedure: COLONOSCOPY;  Surgeon: Daneil Dolin, MD;  [name];  Service: Endoscopy;  Laterality: N/A;  0930  . ESOPHAGOGASTRODUODENOSCOPY  07/2007   Dr. Daiva Nakayama cervical esophageal web, schatzki ring, large hiatal hernia  . INGUNAL HERNIA REPAIR Left   . LOW  ANTERIOR BOWEL RESECTION     NO ADJ CHEMO  . MINIMALLY INVASIVE MAZE PROCEDURE N/A 07/05/2016   Procedure: MINIMALLY INVASIVE MAZE PROCEDURE;  Surgeon: Rexene Alberts, MD;  Location: [facility];  Service: Open Heart Surgery;  Laterality: N/A;  . MITRAL VALVE REPAIR Right 07/05/2016   Procedure: MINIMALLY INVASIVE MITRAL VALVE REPAIR (MVR);  Surgeon: Rexene Alberts, MD;  Location: [facility];  Service: Open Heart Surgery;  Laterality: Right;  .  MULTIPLE EXTRACTIONS WITH ALVEOLOPLASTY N/A 05/21/2016   Procedure: MULTIPLE EXTRACTION OF TOOTH #'S 5, 21 WITH ALVEOLOPLASTY AND GROSS DEBRIDEMENT OF TEETH;  Surgeon: Lenn Cal, DDS;  Location: [facility];  Service: Oral Surgery;  Laterality: N/A;  . TEE WITHOUT CARDIOVERSION N/A 02/20/2016   Procedure: TRANSESOPHAGEAL ECHOCARDIOGRAM (TEE);  Surgeon: Arnoldo Lenis, MD;  [name];  Service: Endoscopy;  Laterality: N/A;  . TEE WITHOUT CARDIOVERSION N/A 07/05/2016   Procedure: TRANSESOPHAGEAL ECHOCARDIOGRAM (TEE);  Surgeon: Rexene Alberts, MD;  Location: [facility];  Service: Open Heart Surgery;  Laterality: N/A;  . TOOTH EXTRACTION    . TUBAL LIGATION    . VEIN LIGATION AND STRIPPING      Current Outpatient Prescriptions  Medication Sig Dispense Refill  . acetaminophen (TYLENOL) 325 MG tablet Take 650 mg by mouth every 6 (six) hours as needed for pain.    Marland Kitchen albuterol (PROVENTIL HFA;VENTOLIN HFA) 108 (90 BASE) MCG/ACT inhaler Inhale 2 puffs into the lungs every 6 (six) hours as needed for wheezing.    Marland Kitchen ALPRAZolam (XANAX) 0.5 MG tablet Take 0.5 mg by mouth 2 (two) times daily as needed (for anxiety/sleep (scheduled at bedtime)).     Marland Kitchen aspirin EC 81 MG EC tablet Take 1 tablet (81 mg total) by mouth daily.    . calcium carbonate (OS-CAL) 600 MG TABS tablet Take 600 mg by mouth 2 (two) times daily with a meal.    . Coenzyme Q10 100 MG TABS Take 100 mg by mouth every evening.     . docusate sodium (COLACE) 100 MG capsule Take 200 mg by mouth daily as needed for mild constipation.     . furosemide (LASIX) 40 MG tablet Take 1 tablet (40 mg total) by mouth 2 (two) times daily. 30 tablet 1  . hydrocortisone (ANUSOL-HC) 2.5 % rectal cream Place 1 application rectally 2 (two) times daily. FOR UP TO 10 DAYS AT A TIME (Patient taking differently: Place 1 application rectally 2 (two) times daily as needed for hemorrhoids or itching. FOR UP TO 10 DAYS AT A TIME) 30 g 1  . metoprolol succinate  (TOPROL-XL) 25 MG 24 hr tablet Take 1 tablet (25 mg total) by mouth 2 (two) times daily. 60 tablet 3  . Multiple Vitamin (MULTIVITAMIN) tablet Take 1 tablet by mouth daily.      Marland Kitchen omeprazole (PRILOSEC) 20 MG capsule Take 1 capsule (20 mg total) by mouth daily. 30 capsule 3  . PARoxetine (PAXIL) 20 MG tablet Take 20 mg by mouth daily.     Vladimir Faster Glycol-Propyl Glycol (SYSTANE ULTRA) 0.4-0.3 % SOLN Place 1 drop into both eyes 3 (three) times daily.    . potassium chloride SA (K-DUR,KLOR-CON) 20 MEQ tablet Take 1 tablet (20 mEq total) by mouth 2 (two) times daily. 180 tablet 3  . sodium chloride (OCEAN) 0.65 % SOLN nasal spray Place 1 spray into both nostrils as needed for congestion.    Marland Kitchen umeclidinium-vilanterol (ANORO ELLIPTA) 62.5-25 MCG/INH AEPB Inhale 1 puff into the  lungs daily. 30 each 6  . warfarin (COUMADIN) 2.5 MG tablet Take 1 tablet daily except 1 1/2 tablets on Sundays, Tuesdays and Thursdays 60 tablet 3  . traMADol (ULTRAM) 50 MG tablet Take 50 mg by mouth every 4-6 hours PRN severe pain. (Patient not taking: Reported on 01/14/2017) 30 tablet 0   No current facility-administered medications for this encounter.     Allergies  Allergen Reactions  . Iohexol Hives and Shortness Of Breath     Pt. had a severe allergic reaction to IV contrast the last time she was injected and had to be seen in the ER.   Marland Kitchen Hydrocodone-Acetaminophen Hives  . Nabumetone Rash  . Prednisone Rash    Social History   Social History  . Marital status: Married    Spouse name: N/A  . Number of children: 2  . Years of education: N/A   Occupational History  . Not on file.   Social History Main Topics  . Smoking status: Never Smoker  . Smokeless tobacco: Never Used  . Alcohol use No     Comment: Rarely  . Drug use: No  . Sexual activity: Not on file   Other Topics Concern  . Not on file   Social History Narrative  . No narrative on file    Family History  Problem Relation Age of Onset    . Breast cancer Mother   . Rheum arthritis Father   . Cancer - Lung Sister   . Brain cancer Sister   . Prostate cancer Brother   . Aneurysm Brother   . Colon cancer Neg Hx     ROS- All systems are reviewed and negative except as per the HPI above  Physical Exam: Vitals:   01/14/17 1151  BP: 114/64  Pulse: 69  Weight: 140 lb 3.2 oz (63.6 kg)  Height: 5\' 2"  (1.575 m)   Wt Readings from Last 3 Encounters:  01/14/17 140 lb 3.2 oz (63.6 kg)  12/20/16 145 lb (65.8 kg)  12/14/16 143 lb 11.8 oz (65.2 kg)    Labs: Lab Results  Component Value Date   NA 139 12/20/2016   K 4.3 12/20/2016   CL 99 (L) 12/20/2016   CO2 34 (H) 12/20/2016   GLUCOSE 99 12/20/2016   BUN 15 12/20/2016   CREATININE 0.77 12/20/2016   CALCIUM 9.2 12/20/2016   MG 2.1 12/20/2016   Lab Results  Component Value Date   INR 3.0 01/03/2017   No results found for: CHOL, HDL, LDLCALC, TRIG   GEN- The patient is well appearing, alert and oriented x 3 today.   Head- normocephalic, atraumatic Eyes-  Sclera clear, conjunctiva pink Ears- hearing intact Oropharynx- clear Neck- supple, no JVP Lymph- no cervical lymphadenopathy Lungs- Clear to ausculation bilaterally, normal work of breathing Heart- Regular rate and rhythm, no murmurs, rubs or gallops, PMI not laterally displaced GI- soft, NT, ND, + BS Extremities- no clubbing, cyanosis, or edema MS- no significant deformity or atrophy Skin- no rash or lesion Psych- euthymic mood, full affect Neuro- strength and sensation are intact  EKG-SR at 69 bpm with Sinus arrythmia, pr int 136 ms, qrs int 88 ms, qtc 437 ms Epic records reviewed    Assessment and Plan: 1. Afib S/p Maze procedure as well as MVR 07/05/16 Doing well, does not sound like pt has appreciated any significant afib since procedure Continue on warfarin Continue metoprolol  F/u in one year for long term surveillance of afib and Maze procedure F/u  with Dr. Roxy Manns March 2019 F/u with Dr.  [name] in [place] as scheduled  Geroge Baseman. [place], [facility] [address] [place],  [address] [phone]

## 2017-01-17 ENCOUNTER — Ambulatory Visit (INDEPENDENT_AMBULATORY_CARE_PROVIDER_SITE_OTHER): Payer: Medicare Other | Admitting: *Deleted

## 2017-01-17 DIAGNOSIS — I481 Persistent atrial fibrillation: Secondary | ICD-10-CM | POA: Diagnosis not present

## 2017-01-17 DIAGNOSIS — Z9889 Other specified postprocedural states: Secondary | ICD-10-CM

## 2017-01-17 DIAGNOSIS — I4819 Other persistent atrial fibrillation: Secondary | ICD-10-CM

## 2017-01-17 DIAGNOSIS — Z5181 Encounter for therapeutic drug level monitoring: Secondary | ICD-10-CM | POA: Diagnosis not present

## 2017-01-17 LAB — POCT INR: INR: 3.8

## 2017-01-22 DIAGNOSIS — S6991XA Unspecified injury of right wrist, hand and finger(s), initial encounter: Secondary | ICD-10-CM | POA: Diagnosis not present

## 2017-01-22 DIAGNOSIS — M25551 Pain in right hip: Secondary | ICD-10-CM | POA: Diagnosis not present

## 2017-01-22 DIAGNOSIS — S79911A Unspecified injury of right hip, initial encounter: Secondary | ICD-10-CM | POA: Diagnosis not present

## 2017-01-22 DIAGNOSIS — J449 Chronic obstructive pulmonary disease, unspecified: Secondary | ICD-10-CM | POA: Diagnosis not present

## 2017-01-22 DIAGNOSIS — Z299 Encounter for prophylactic measures, unspecified: Secondary | ICD-10-CM | POA: Diagnosis not present

## 2017-01-22 DIAGNOSIS — W19XXXA Unspecified fall, initial encounter: Secondary | ICD-10-CM | POA: Diagnosis not present

## 2017-01-22 DIAGNOSIS — M25511 Pain in right shoulder: Secondary | ICD-10-CM | POA: Diagnosis not present

## 2017-01-22 DIAGNOSIS — M1711 Unilateral primary osteoarthritis, right knee: Secondary | ICD-10-CM | POA: Diagnosis not present

## 2017-01-22 DIAGNOSIS — M25461 Effusion, right knee: Secondary | ICD-10-CM | POA: Diagnosis not present

## 2017-01-22 DIAGNOSIS — M25561 Pain in right knee: Secondary | ICD-10-CM | POA: Diagnosis not present

## 2017-01-22 DIAGNOSIS — I4891 Unspecified atrial fibrillation: Secondary | ICD-10-CM | POA: Diagnosis not present

## 2017-01-22 DIAGNOSIS — Z6821 Body mass index (BMI) 21.0-21.9, adult: Secondary | ICD-10-CM | POA: Diagnosis not present

## 2017-01-22 DIAGNOSIS — S79912A Unspecified injury of left hip, initial encounter: Secondary | ICD-10-CM | POA: Diagnosis not present

## 2017-01-24 DIAGNOSIS — Z1231 Encounter for screening mammogram for malignant neoplasm of breast: Secondary | ICD-10-CM | POA: Diagnosis not present

## 2017-01-28 ENCOUNTER — Ambulatory Visit (INDEPENDENT_AMBULATORY_CARE_PROVIDER_SITE_OTHER): Payer: Medicare Other | Admitting: Nurse Practitioner

## 2017-01-28 ENCOUNTER — Encounter: Payer: Self-pay | Admitting: Nurse Practitioner

## 2017-01-28 VITALS — BP 106/67 | HR 87 | Temp 98.3°F | Ht 62.0 in | Wt 145.8 lb

## 2017-01-28 DIAGNOSIS — K5900 Constipation, unspecified: Secondary | ICD-10-CM | POA: Diagnosis not present

## 2017-01-28 DIAGNOSIS — K649 Unspecified hemorrhoids: Secondary | ICD-10-CM | POA: Insufficient documentation

## 2017-01-28 NOTE — Patient Instructions (Signed)
1. Continue your current medications, including Anusol rectal cream. 2. Return for follow-up in 6 months. 3. Call us if you have any questions or concerns before then.

## 2017-01-28 NOTE — Assessment & Plan Note (Addendum)
The patient has a chronic history of hemorrhoids. She is on Coumadin anticoagulation and has intermittent to rare spotting on the toilet tissue after bowel movement. No worse then baseline. Not overtly bothersome. No significant blood. Recommend she continue using Anusol cream which she feels helps. Return for follow-up in 6 months. She is due for colonoscopy in 2020, if health permits.

## 2017-01-28 NOTE — Assessment & Plan Note (Signed)
Constipation is improved on Colace and increase dietary fiber. No significant symptoms recently. Recommend she continue her current medication treatments and return for follow-up in 6 months.

## 2017-01-28 NOTE — Progress Notes (Signed)
Referring Provider: Glenda Chroman, MD Primary Care Physician:  Glenda Chroman, MD Primary GI:  Dr. Gala Romney  Chief Complaint  Patient presents with  . Constipation  . Hemorrhoids    HPI:   [name] [name] is a 76 y.o. female who presents for follow-up on constipation and hemorrhoids. The patient was last seen in our office 06/20/2016, on anticoagulation for A. fib. Colonoscopy up-to-date and do in 2020 if health permits. At her last visit occasional hemorrhoid bleeding with over-the-counter treatment, occasional constipation as Colace once a day. Mid to lower abdominal pain which improves after bowel movement. Recommended continue Colace, take MiraLAX one to 2 times a day as needed, Anusol rectal cream was sent to the pharmacy, return for follow-up in 6 months.  She underwent minimally invasive mitral valve repair on 07/05/2016.  Today she states she's doing well overall. Heart surgery went well, can tell a difference. Constipation improved on Colace and increased dietary fiber. Still with intermittent hemorrhoid bleeding on anticoagulation (Coumadin) which occurs between once every week to several weeks, limited to scant toilet tissue hematochezia. Denies abdominal pain, N/V, melena. Denies chest pain, dyspnea, dizziness, lightheadedness, syncope, near syncope. Denies any other upper or lower GI symptoms.  Past Medical History:  Diagnosis Date  . Anxiety   . Arthritis   . Asthma   . Atrial fibrillation, persistent ([place])   . Chronic diastolic congestive heart failure ([place])   . Colon adenoma   . Colon cancer ([place])    status post low anterior resection, limited stage disease requiring no adjuvant therapy  . COPD (chronic obstructive pulmonary disease) ([facility])   . Depression   . Diverticulosis   . DM (dermatomyositis)   . DVT (deep venous thrombosis) ([name])    in leg- long time ago  . Dyspnea    with activity  . Dysrhythmia   . Esophageal dysphagia   . GERD (gastroesophageal  reflux disease)   . Headache   . Heart murmur   . Hematuria   . Hemorrhoids   . Hiatal hernia   . History of kidney stones    x 2  . Hypercholesterolemia   . Incidental pulmonary nodule 05/08/2016   8 mm vague opacity RML noted on CT scan  . Mitral regurgitation   . PONV (postoperative nausea and vomiting) 2003 ish    with breast biopsy  . S/P minimally invasive maze operation for atrial fibrillation 07/05/2016   Complete bilateral atrial lesion set using cryothermy and bipolar radiofrequency ablation with clipping of LA appendage via right mini thoracotomy approach  . 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 Schatzki's ring   . Tricuspid regurgitation     Past Surgical History:  Procedure Laterality Date  . APPENDECTOMY    . BREAST SURGERY Right 2003ish   biopsy  . CARDIAC CATHETERIZATION N/A 04/12/2016   Procedure: Right/Left Heart Cath and Coronary Angiography;  Surgeon: Leonie Man, MD;  Location: [facility];  Service: Cardiovascular;  Laterality: N/A;  . CATARACT EXTRACTION Bilateral 2017  . CLIPPING OF ATRIAL APPENDAGE  07/05/2016   Procedure: CLIPPING OF ATRIAL APPENDAGE;  Surgeon: Rexene Alberts, MD;  Location: [facility];  Service: Open Heart Surgery;;  . COLONOSCOPY  11/09   Dr. Gala Romney- external hemorrhoidal tags o/w normal rectal mucosa, s/p surgical resection with a normal appearing  anastomosis 12cm, pan colonic diverticulum  . COLONOSCOPY N/A 06/02/2012   [id number] post low anterior resection. Pancolonic diverticulosis. Colonic polyp-tubular adenoma. Surveillance due 2019.   Marland Kitchen COLONOSCOPY N/A 10/13/2015   Procedure: COLONOSCOPY;  Surgeon: Daneil Dolin, MD;  [name];  Service: Endoscopy;  Laterality: N/A;  0930  . ESOPHAGOGASTRODUODENOSCOPY  07/2007   Dr. Daiva Nakayama cervical  esophageal web, schatzki ring, large hiatal hernia  . INGUNAL HERNIA REPAIR Left   . LOW ANTERIOR BOWEL RESECTION     NO ADJ CHEMO  . MINIMALLY INVASIVE MAZE PROCEDURE N/A 07/05/2016   Procedure: MINIMALLY INVASIVE MAZE PROCEDURE;  Surgeon: Rexene Alberts, MD;  Location: [facility];  Service: Open Heart Surgery;  Laterality: N/A;  . MITRAL VALVE REPAIR Right 07/05/2016   Procedure: MINIMALLY INVASIVE MITRAL VALVE REPAIR (MVR);  Surgeon: Rexene Alberts, MD;  Location: [facility];  Service: Open Heart Surgery;  Laterality: Right;  . MULTIPLE EXTRACTIONS WITH ALVEOLOPLASTY N/A 05/21/2016   Procedure: MULTIPLE EXTRACTION OF TOOTH #'S 5, 21 WITH ALVEOLOPLASTY AND GROSS DEBRIDEMENT OF TEETH;  Surgeon: Lenn Cal, DDS;  Location: [facility];  Service: Oral Surgery;  Laterality: N/A;  . TEE WITHOUT CARDIOVERSION N/A 02/20/2016   Procedure: TRANSESOPHAGEAL ECHOCARDIOGRAM (TEE);  Surgeon: Arnoldo Lenis, MD;  [name];  Service: Endoscopy;  Laterality: N/A;  . TEE WITHOUT CARDIOVERSION N/A 07/05/2016   Procedure: TRANSESOPHAGEAL ECHOCARDIOGRAM (TEE);  Surgeon: Rexene Alberts, MD;  Location: [facility];  Service: Open Heart Surgery;  Laterality: N/A;  . TOOTH EXTRACTION    . TUBAL LIGATION    . VEIN LIGATION AND STRIPPING      Current Outpatient Prescriptions  Medication Sig Dispense Refill  . albuterol (PROVENTIL HFA;VENTOLIN HFA) 108 (90 BASE) MCG/ACT inhaler Inhale 2 puffs into the lungs every 6 (six) hours as needed for wheezing.    Marland Kitchen ALPRAZolam (XANAX) 0.5 MG tablet Take 0.5 mg by mouth 2 (two) times daily as needed (for anxiety/sleep (scheduled at bedtime)).     Marland Kitchen aspirin EC 81 MG EC tablet Take 1 tablet (81 mg total) by mouth daily.    . calcium carbonate (OS-CAL) 600 MG TABS tablet Take 600 mg by mouth 2 (two) times daily with a meal.    . Coenzyme Q10 100 MG TABS Take 100 mg by mouth every evening.     . docusate sodium (COLACE) 100 MG capsule Take 200 mg by mouth daily as needed for mild  constipation.     . furosemide (LASIX) 40 MG tablet Take 1 tablet (40 mg total) by mouth 2 (two) times daily. 30 tablet 1  . hydrocortisone (ANUSOL-HC) 2.5 % rectal cream Place 1 application rectally 2 (two) times daily. FOR UP TO 10 DAYS AT A TIME (Patient taking differently: Place 1 application rectally 2 (two) times daily as needed for hemorrhoids or itching. FOR UP TO 10 DAYS AT A TIME) 30 g 1  . metoprolol succinate (TOPROL-XL) 25 MG 24 hr tablet Take 1 tablet (25 mg total) by mouth 2 (two) times daily. 60 tablet 3  . Multiple Vitamin (MULTIVITAMIN) tablet Take 1 tablet by mouth daily.      Marland Kitchen omeprazole (PRILOSEC) 20 MG capsule Take 1 capsule (20 mg total) by mouth daily. 30 capsule 3  . PARoxetine (PAXIL) 20 MG tablet Take 20 mg by mouth daily.     Vladimir Faster Glycol-Propyl Glycol (SYSTANE ULTRA) 0.4-0.3 % SOLN Place 1 drop into both eyes 3 (three) times daily.    Marland Kitchen  potassium chloride SA (K-DUR,KLOR-CON) 20 MEQ tablet Take 1 tablet (20 mEq total) by mouth 2 (two) times daily. 180 tablet 3  . sodium chloride (OCEAN) 0.65 % SOLN nasal spray Place 1 spray into both nostrils as needed for congestion.    . traMADol (ULTRAM) 50 MG tablet Take 50 mg by mouth every 4-6 hours PRN severe pain. 30 tablet 0  . umeclidinium-vilanterol (ANORO ELLIPTA) 62.5-25 MCG/INH AEPB Inhale 1 puff into the lungs daily. 30 each 6  . warfarin (COUMADIN) 2.5 MG tablet Take 1 tablet daily except 1 1/2 tablets on Sundays, Tuesdays and Thursdays 60 tablet 3   No current facility-administered medications for this visit.     Allergies as of 01/28/2017 - Review Complete 01/28/2017  Allergen Reaction Noted  . Iohexol Hives and Shortness Of Breath 07/16/2008  . Hydrocodone-acetaminophen Hives 04/26/2010  . Nabumetone Rash 04/26/2010  . Prednisone Rash 10/13/2015    Family History  Problem Relation Age of Onset  . Breast cancer Mother   . Rheum arthritis Father   . Cancer - Lung Sister   . Brain cancer Sister   .  Prostate cancer Brother   . Aneurysm Brother   . Colon cancer Neg Hx     Social History   Social History  . Marital status: Married    Spouse name: N/A  . Number of children: 2  . Years of education: N/A   Social History Main Topics  . Smoking status: Never Smoker  . Smokeless tobacco: Never Used  . Alcohol use No     Comment: Rarely  . Drug use: No  . Sexual activity: Not Asked   Other Topics Concern  . None   Social History Narrative  . None    Review of Systems: General: Negative for anorexia, weight loss, fever, chills, fatigue, weakness. ENT: Negative for hoarseness, difficulty swallowing. CV: Negative for chest pain, angina, palpitations, peripheral edema.  Respiratory: Negative for dyspnea at rest, cough, sputum, wheezing.  GI: See history of present illness. MS: Chronic/worsening knee pain.  Derm: Negative for rash or itching.  Endo: Negative for unusual weight change.  Heme: Negative for bruising or bleeding.   Physical Exam: BP 106/67   Pulse 87   Temp 98.3 F (36.8 C) (Oral)   Ht 5\' 2"  (1.575 m)   Wt 145 lb 12.8 oz (66.1 kg)   BMI 26.67 kg/m  General:   Alert and oriented. Pleasant and cooperative. Well-nourished and well-developed.  Eyes:  Without icterus, sclera clear and conjunctiva pink.  Ears:  Normal auditory acuity. Cardiovascular:  S1, S2 present with 1-2/7 systolic murmur appreciated. Extremities without clubbing or edema. Respiratory:  Clear to auscultation bilaterally. No wheezes, rales, or rhonchi. No distress.  Gastrointestinal:  +BS, soft, non-tender and non-distended. No HSM noted. No guarding or rebound. No masses appreciated.  Rectal:  Deferred  Musculoskalatal:  Right knee appears a bit swollen. Neurologic:  Alert and oriented x4;  grossly normal neurologically. Psych:  Alert and cooperative. Normal mood and affect. Heme/Lymph/Immune: No excessive bruising noted.    01/28/2017 9:52 AM   Disclaimer: This note was dictated  with voice recognition software. Similar sounding words can inadvertently be transcribed and may not be corrected upon review.

## 2017-01-29 ENCOUNTER — Ambulatory Visit (INDEPENDENT_AMBULATORY_CARE_PROVIDER_SITE_OTHER): Payer: Medicare Other | Admitting: *Deleted

## 2017-01-29 DIAGNOSIS — I481 Persistent atrial fibrillation: Secondary | ICD-10-CM

## 2017-01-29 DIAGNOSIS — I4819 Other persistent atrial fibrillation: Secondary | ICD-10-CM

## 2017-01-29 DIAGNOSIS — Z9889 Other specified postprocedural states: Secondary | ICD-10-CM | POA: Diagnosis not present

## 2017-01-29 DIAGNOSIS — Z5181 Encounter for therapeutic drug level monitoring: Secondary | ICD-10-CM | POA: Diagnosis not present

## 2017-01-29 LAB — POCT INR: INR: 3.7

## 2017-01-29 NOTE — Progress Notes (Signed)
cc'ed to pcp °

## 2017-02-05 DIAGNOSIS — M1711 Unilateral primary osteoarthritis, right knee: Secondary | ICD-10-CM | POA: Diagnosis not present

## 2017-02-12 ENCOUNTER — Ambulatory Visit (INDEPENDENT_AMBULATORY_CARE_PROVIDER_SITE_OTHER): Payer: Medicare Other | Admitting: *Deleted

## 2017-02-12 DIAGNOSIS — Z5181 Encounter for therapeutic drug level monitoring: Secondary | ICD-10-CM

## 2017-02-12 DIAGNOSIS — I4819 Other persistent atrial fibrillation: Secondary | ICD-10-CM

## 2017-02-12 DIAGNOSIS — I481 Persistent atrial fibrillation: Secondary | ICD-10-CM

## 2017-02-12 DIAGNOSIS — Z9889 Other specified postprocedural states: Secondary | ICD-10-CM

## 2017-02-12 LAB — POCT INR: INR: 1.7

## 2017-02-13 DIAGNOSIS — E7800 Pure hypercholesterolemia, unspecified: Secondary | ICD-10-CM | POA: Diagnosis not present

## 2017-02-13 DIAGNOSIS — I4891 Unspecified atrial fibrillation: Secondary | ICD-10-CM | POA: Diagnosis not present

## 2017-02-13 DIAGNOSIS — J449 Chronic obstructive pulmonary disease, unspecified: Secondary | ICD-10-CM | POA: Diagnosis not present

## 2017-02-13 DIAGNOSIS — M159 Polyosteoarthritis, unspecified: Secondary | ICD-10-CM | POA: Diagnosis not present

## 2017-02-22 DIAGNOSIS — M159 Polyosteoarthritis, unspecified: Secondary | ICD-10-CM | POA: Diagnosis not present

## 2017-02-22 DIAGNOSIS — I4891 Unspecified atrial fibrillation: Secondary | ICD-10-CM | POA: Diagnosis not present

## 2017-02-22 DIAGNOSIS — E7800 Pure hypercholesterolemia, unspecified: Secondary | ICD-10-CM | POA: Diagnosis not present

## 2017-02-22 DIAGNOSIS — J449 Chronic obstructive pulmonary disease, unspecified: Secondary | ICD-10-CM | POA: Diagnosis not present

## 2017-02-24 ENCOUNTER — Other Ambulatory Visit: Payer: Self-pay | Admitting: Cardiology

## 2017-02-25 ENCOUNTER — Telehealth: Payer: Self-pay

## 2017-02-25 NOTE — Telephone Encounter (Signed)
Patient contacted office stating she has not been feeling very well the past week. Has been keeping a check on HR and it has been over 100 the past week. Today HR is 114. BP 117/71 Patient states it feels like her heart is "beating out of her chest". Patient reports some shortness of breath, a little lightheaded but denies any dizziness. Medications reviewed with patient and patient reports all meds have been taken as prescribed. Advised patient if she developed chest pain or shortness of breath increased she needed to go to the ER. Patient verbalized understanding.

## 2017-02-25 NOTE — Telephone Encounter (Signed)
Are there any PA spots Tues or Wed in [place], if not the earliest spot would be Thursday 340pm with me in [place] Caster MD

## 2017-02-25 NOTE — Telephone Encounter (Signed)
Appointment scheduled 11/7 with [facility]

## 2017-02-26 NOTE — Progress Notes (Signed)
Cardiology Office Note    Date:  02/27/2017   ID:  [name], DOB [birth year], MRN [id number]  PCP:  Glenda Chroman, MD  Cardiologist: Dr. [name]  Chief Complaint  Patient presents with  . Follow-up    Worsening palpitations    History of Present Illness:    [name] [name] is a 76 y.o. female with past medical history of PAF (s/p Maze procedure in 06/2016 with atrial appendage clipping), chronic diastolic CHF, severe MR (s/p MVR in 06/2016), and COPD who presents to the office today for evaluation of worsening palpitations.   She was most recently evaluated by Roderic Palau, NP in the [facility] on 01/14/2017 and denied any recent chest pain, dyspnea, or palpitations. She was continued on Toprol-XL 25mg  BID for rate-control along with Coumadin for anticoagulation.   She called the office on 02/25/2017 reporting not feeling well over the past week and noticing an elevated HR in the 110's. Reported feeling like her heart was "beating out of her chest", therefore Cardiology follow-up was recommended.   In talking with the patient today, she reports having significant right knee pain for the past 2 weeks. She has followed up with Orthopedics and received a steroid injection with improvement in her symptoms. Around the time she started to have pain, she also noted her heart rate was elevated and felt like it was "fluttering" at times. She checked her HR and it has consistently been in the low-100's to 110's since. She reports the fluttering only lasts for seconds at a time when it does occur. She denies any associated chest pain, dyspnea, orthopnea, PND, or lower extremity edema.   She reports good compliance with her medication regimen, including Coumadin. She denies any recent melena, hematochezia, or hematuria.   Past Medical History:  Diagnosis Date  . Anxiety   . Arthritis   . Asthma   . Atrial fibrillation, persistent ([place])   . Chronic diastolic  congestive heart failure ([place])   . Colon adenoma   . Colon cancer ([place])    status post low anterior resection, limited stage disease requiring no adjuvant therapy  . COPD (chronic obstructive pulmonary disease) ([place])   . Depression   . Diverticulosis   . DM (dermatomyositis)   . DVT (deep venous thrombosis) ([name])    in leg- long time ago  . Dyspnea    with activity  . Dysrhythmia   . Esophageal dysphagia   . GERD (gastroesophageal reflux disease)   . Headache   . Heart murmur   . Hematuria   . Hemorrhoids   . Hiatal hernia   . History of kidney stones    x 2  . Hypercholesterolemia   . Incidental pulmonary nodule 05/08/2016   8 mm vague opacity RML noted on CT scan  . Mitral regurgitation   . PONV (postoperative nausea and vomiting) 2003 ish    with breast biopsy  . S/P minimally invasive maze operation for atrial fibrillation 07/05/2016   Complete bilateral atrial lesion set using cryothermy and bipolar radiofrequency ablation with clipping of LA appendage via right mini thoracotomy approach  . 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 Schatzki's ring   . Tricuspid regurgitation     Past Surgical History:  Procedure Laterality Date  . APPENDECTOMY    . BREAST SURGERY Right 2003ish  biopsy  . CATARACT EXTRACTION Bilateral 2017  . COLONOSCOPY  11/09   Dr. Gala Romney- external hemorrhoidal tags o/w normal rectal mucosa, s/p surgical resection with a normal appearing anastomosis 12cm, pan colonic diverticulum  . ESOPHAGOGASTRODUODENOSCOPY  07/2007   Dr. Daiva Nakayama cervical esophageal web, schatzki ring, large hiatal hernia  . INGUNAL HERNIA REPAIR Left   . LOW ANTERIOR BOWEL RESECTION     NO ADJ CHEMO  . TOOTH EXTRACTION    . TUBAL LIGATION    . VEIN LIGATION AND STRIPPING      Current  Medications: Outpatient Medications Prior to Visit  Medication Sig Dispense Refill  . albuterol (PROVENTIL HFA;VENTOLIN HFA) 108 (90 BASE) MCG/ACT inhaler Inhale 2 puffs into the lungs every 6 (six) hours as needed for wheezing.    Marland Kitchen ALPRAZolam (XANAX) 0.5 MG tablet Take 0.5 mg by mouth 2 (two) times daily as needed (for anxiety/sleep (scheduled at bedtime)).     Marland Kitchen aspirin EC 81 MG EC tablet Take 1 tablet (81 mg total) by mouth daily.    . calcium carbonate (OS-CAL) 600 MG TABS tablet Take 600 mg by mouth 2 (two) times daily with a meal.    . Coenzyme Q10 100 MG TABS Take 100 mg by mouth every evening.     . docusate sodium (COLACE) 100 MG capsule Take 200 mg by mouth daily as needed for mild constipation.     . furosemide (LASIX) 40 MG tablet Take 1 tablet (40 mg total) by mouth 2 (two) times daily. 30 tablet 1  . hydrocortisone (ANUSOL-HC) 2.5 % rectal cream Place 1 application rectally 2 (two) times daily. FOR UP TO 10 DAYS AT A TIME (Patient taking differently: Place 1 application rectally 2 (two) times daily as needed for hemorrhoids or itching. FOR UP TO 10 DAYS AT A TIME) 30 g 1  . Multiple Vitamin (MULTIVITAMIN) tablet Take 1 tablet by mouth daily.      Marland Kitchen omeprazole (PRILOSEC) 20 MG capsule Take 1 capsule (20 mg total) by mouth daily. 30 capsule 3  . PARoxetine (PAXIL) 20 MG tablet Take 20 mg by mouth daily.     Vladimir Faster Glycol-Propyl Glycol (SYSTANE ULTRA) 0.4-0.3 % SOLN Place 1 drop into both eyes 3 (three) times daily.    . potassium chloride SA (K-DUR,KLOR-CON) 20 MEQ tablet Take 1 tablet (20 mEq total) by mouth 2 (two) times daily. 180 tablet 3  . sodium chloride (OCEAN) 0.65 % SOLN nasal spray Place 1 spray into both nostrils as needed for congestion.    . traMADol (ULTRAM) 50 MG tablet Take 50 mg by mouth every 4-6 hours PRN severe pain. 30 tablet 0  . umeclidinium-vilanterol (ANORO ELLIPTA) 62.5-25 MCG/INH AEPB Inhale 1 puff into the lungs daily. 30 each 6  . warfarin (COUMADIN)  2.5 MG tablet Take 1 tablet daily except 1 1/2 tablets on Sundays, Tuesdays and Thursdays 60 tablet 3  . metoprolol succinate (TOPROL-XL) 25 MG 24 hr tablet TAKE 1 TABLET BY MOUTH TWICE A DAY 60 tablet 6   No facility-administered medications prior to visit.      Allergies:   Iohexol; Hydrocodone-acetaminophen; Nabumetone; and Prednisone   Social History   Socioeconomic History  . Marital status: Married    Spouse name: None  . Number of children: 2  . Years of education: None  . Highest education level: None  Social Needs  . Financial resource strain: None  . Food insecurity - worry: None  . Food insecurity - inability: None  .  Transportation needs - medical: None  . Transportation needs - non-medical: None  Occupational History  . None  Tobacco Use  . Smoking status: Never Smoker  . Smokeless tobacco: Never Used  Substance and Sexual Activity  . Alcohol use: No    Alcohol/week: 0.0 oz    Comment: Rarely  . Drug use: No  . Sexual activity: None  Other Topics Concern  . None  Social History Narrative  . None     Family History:  The patient's family history includes Aneurysm in her brother; Brain cancer in her sister; Breast cancer in her mother; Cancer - Lung in her sister; Prostate cancer in her brother; Rheum arthritis in her father.   Review of Systems:   Please see the history of present illness.     General:  No chills, fever, night sweats or weight changes.  Cardiovascular:  No chest pain, dyspnea on exertion, edema, orthopnea, paroxysmal nocturnal dyspnea. Positive for palpitations.  Dermatological: No rash, lesions/masses Respiratory: No cough, dyspnea Urologic: No hematuria, dysuria Abdominal:   No nausea, vomiting, diarrhea, bright red blood per rectum, melena, or hematemesis Neurologic:  No visual changes, wkns, changes in mental status. All other systems reviewed and are otherwise negative except as noted above.   Physical Exam:    VS:  BP 128/68    Pulse (!) 116   Ht 5\' 2"  (1.575 m)   Wt 144 lb 3.2 oz (65.4 kg)   SpO2 96% Comment: on room air  BMI 26.37 kg/m    General: Well developed, well nourished Caucasian female appearing in no acute distress. Head: Normocephalic, atraumatic, sclera non-icteric, no xanthomas, nares are without discharge.  Neck: No carotid bruits. JVD not elevated.  Lungs: Respirations regular and unlabored, without wheezes or rales.  Heart:Irregularly irregular. No S3 or S4.  No murmur, no rubs, or gallops appreciated. Abdomen: Soft, non-tender, non-distended with normoactive bowel sounds. No hepatomegaly. No rebound/guarding. No obvious abdominal masses. Msk:  Strength and tone appear normal for age. No joint deformities or effusions. Extremities: No clubbing or cyanosis. No lower extremity edema.  Distal pedal pulses are 2+ bilaterally. Neuro: Alert and oriented X 3. Moves all extremities spontaneously. No focal deficits noted. Psych:  Responds to questions appropriately with a normal affect. Skin: No rashes or lesions noted  Wt Readings from Last 3 Encounters:  02/27/17 144 lb 3.2 oz (65.4 kg)  01/28/17 145 lb 12.8 oz (66.1 kg)  01/14/17 140 lb 3.2 oz (63.6 kg)     Studies/Labs Reviewed:   EKG:  EKG is ordered today.  The ekg ordered today demonstrates atrial fibrillation with RVR, HR 111.  Recent Labs: 07/03/2016: ALT 19 07/27/2016: Hemoglobin 9.9; Platelets 469; TSH 3.327 12/20/2016: BUN 15; Creatinine, Ser 0.77; Magnesium 2.1; Potassium 4.3; Sodium 139   Lipid Panel No results found for: CHOL, TRIG, HDL, CHOLHDL, VLDL, LDLCALC, LDLDIRECT  Additional studies/ records that were reviewed today include:   Echocardiogram: 10/2016 Study Conclusions  - Left ventricle: The cavity size was normal. Wall thickness was   normal. Systolic function was normal. The estimated ejection   fraction was in the range of 60% to 65%. Wall motion was normal;   there were no regional wall motion abnormalities. The  study is   not technically sufficient to allow evaluation of LV diastolic   function. - Aortic valve: Moderately calcified annulus. Trileaflet. - Mitral valve: Status post complex valvuloplasty including   artificial Gore-tex neochord placement and Sorin Memo 3D Ring   Annuloplasty (  size 37mm, catalog #[id number], serial [id number]). There   was trivial regurgitation. Mean gradient (D): 4 mm Hg. - Left atrium: The atrium was mildly dilated. - Right ventricle: The cavity size was mildly dilated. Systolic   function appears mildly reduced. - Right atrium: The atrium was mildly to moderately dilated. - Tricuspid valve: There was moderate regurgitation. - Pulmonary arteries: PA peak pressure: 43 mm Hg (S).   Assessment:    1. Paroxysmal atrial fibrillation ([name])   2. Current use of long term anticoagulation   3. S/P mitral valve repair   4. Chronic diastolic congestive heart failure ([place])   5. Chronic obstructive pulmonary disease, unspecified COPD type ([place])      Plan:   In order of problems listed above:  1. Paroxysmal Atrial Fibrillation - the patient has a known history of atrial fibrillation, having undergone a Maze procedure in 06/2016 with atrial appendage clipping at that time. She has been followed by the Atrial Fibrillation clinic and maintained NSR.  - starting two weeks ago at which time she began to experience right knee pain, she also noticed her heart was "skipping beats". HR has been elevated in the low-100's since.  - overall, she is not very symptomatic with the arrhythmia. At the time of today's visit, her HR is in the 110's and she is unaware of her tachycardia. She is currently on Toprol-XL 25mg  BID and BP is stable at 125/68. Will increase Toprol-XL to 50mg  BID. I recommended she follow-up with the atrial fibrillation clinic but she lives in [place] and wants to avoid driving to [place]. Therefore, we will reassess her at our [facility] or [place] location in two weeks and if  rates still remain difficult to control, she will need closer EP evaluation.   2. Use of Long-Term Anticoagulation - she is on Coumadin for atrial fibrillation and in the setting of her mitral valve repair.  - she denies any evidence of active bleeding. Dosing per the Coumadin Clinic.   3. Severe MR - s/p MVR in 06/2016.   4. Chronic Diastolic CHF - EF was at 44-81% by her most recent echocardiogram.  - she does not appear volume overloaded by physical examination.  - continue Lasix 40mg  BID.   5. COPD - lungs are clear on examination. Followed by PCP.   Medication Adjustments/Labs and Tests Ordered: Current medicines are reviewed at length with the patient today.  Concerns regarding medicines are outlined above.  Medication changes, Labs and Tests ordered today are listed in the Patient Instructions below. Patient Instructions  Medication Instructions:  Your physician has recommended you make the following change in your medication:  Toprol XL 50 mg Two Times Daily   Labwork: NONE   Testing/Procedures: NONE   Follow-Up: Your physician recommends that you schedule a follow-up appointment in: 2 Week   Any Other Special Instructions Will Be Listed Below (If Applicable).  If you need a refill on your cardiac medications before your next appointment, please call your pharmacy.  Thank you for choosing [facility]!    Signed, Erma Heritage, PA-C  02/27/2017 7:59 PM    [facility] [place], [place], [place]  [address] Phone: [phone]; Fax: [phone]  [address] [place], [place] [address] Phone: [phone]

## 2017-02-27 ENCOUNTER — Encounter: Payer: Self-pay | Admitting: Student

## 2017-02-27 ENCOUNTER — Ambulatory Visit (INDEPENDENT_AMBULATORY_CARE_PROVIDER_SITE_OTHER): Payer: Medicare Other | Admitting: Student

## 2017-02-27 VITALS — BP 128/68 | HR 116 | Ht 62.0 in | Wt 144.2 lb

## 2017-02-27 DIAGNOSIS — Z9889 Other specified postprocedural states: Secondary | ICD-10-CM | POA: Diagnosis not present

## 2017-02-27 DIAGNOSIS — I480 Paroxysmal atrial fibrillation: Secondary | ICD-10-CM | POA: Diagnosis not present

## 2017-02-27 DIAGNOSIS — Z299 Encounter for prophylactic measures, unspecified: Secondary | ICD-10-CM | POA: Diagnosis not present

## 2017-02-27 DIAGNOSIS — J449 Chronic obstructive pulmonary disease, unspecified: Secondary | ICD-10-CM | POA: Diagnosis not present

## 2017-02-27 DIAGNOSIS — Z7901 Long term (current) use of anticoagulants: Secondary | ICD-10-CM | POA: Diagnosis not present

## 2017-02-27 DIAGNOSIS — I5032 Chronic diastolic (congestive) heart failure: Secondary | ICD-10-CM | POA: Diagnosis not present

## 2017-02-27 DIAGNOSIS — Z713 Dietary counseling and surveillance: Secondary | ICD-10-CM | POA: Diagnosis not present

## 2017-02-27 DIAGNOSIS — I4891 Unspecified atrial fibrillation: Secondary | ICD-10-CM | POA: Diagnosis not present

## 2017-02-27 DIAGNOSIS — Z6821 Body mass index (BMI) 21.0-21.9, adult: Secondary | ICD-10-CM | POA: Diagnosis not present

## 2017-02-27 DIAGNOSIS — F419 Anxiety disorder, unspecified: Secondary | ICD-10-CM | POA: Diagnosis not present

## 2017-02-27 MED ORDER — METOPROLOL SUCCINATE ER 50 MG PO TB24: 50.0000 mg | ORAL_TABLET | Freq: Two times a day (BID) | ORAL | 11 refills | Status: DC

## 2017-02-27 NOTE — Patient Instructions (Signed)
Medication Instructions:  Your physician has recommended you make the following change in your medication:  Toprol XL 50 mg Two Times Daily    Labwork: NONE   Testing/Procedures: NONE   Follow-Up: Your physician recommends that you schedule a follow-up appointment in: 2 Week    Any Other Special Instructions Will Be Listed Below (If Applicable).     If you need a refill on your cardiac medications before your next appointment, please call your pharmacy.  Thank you for choosing [facility]!

## 2017-03-05 ENCOUNTER — Ambulatory Visit (INDEPENDENT_AMBULATORY_CARE_PROVIDER_SITE_OTHER): Payer: Medicare Other | Admitting: *Deleted

## 2017-03-05 DIAGNOSIS — I481 Persistent atrial fibrillation: Secondary | ICD-10-CM | POA: Diagnosis not present

## 2017-03-05 DIAGNOSIS — I4819 Other persistent atrial fibrillation: Secondary | ICD-10-CM

## 2017-03-05 DIAGNOSIS — Z9889 Other specified postprocedural states: Secondary | ICD-10-CM | POA: Diagnosis not present

## 2017-03-05 DIAGNOSIS — Z5181 Encounter for therapeutic drug level monitoring: Secondary | ICD-10-CM | POA: Diagnosis not present

## 2017-03-05 LAB — POCT INR: INR: 2.4

## 2017-03-12 NOTE — Progress Notes (Signed)
Cardiology Office Note    Date:  03/13/2017   ID:  [name] [name], DOB May 31, 1940, MRN [id number]  PCP:  Glenda Chroman, MD  Cardiologist: Dr. [name]  Chief Complaint  Patient presents with  . Follow-up    2-week visit    History of Present Illness:    [name] [name] is a 76 y.o. female with past medical history of PAF (s/p Maze procedure in 06/2016 with atrial appendage clipping), chronic diastolic CHF, severe MR (s/p MVR in 06/2016), and COPD who presents to the office today for 2-week follow-up.   She was last examined by myself on 02/27/2017 for worsening palpitations, reporting a fluttering sensation in her chest for the past two weeks. She had checked her HR at home and it was elevated into the 110's at rest. EKG at the time of her visit confirmed she was in atrial fibrillation with HR at 111. Her Toprol-XL was increased from 25mg  BID to 50mg  BID with her being continued on Coumadin for anticoagulation.   In talking with the patient today, she reports having episodes of palpitations since her last office visit. She has been monitoring her HR at home and it has been stable at 110 - 115 bpm per her report. Has noticed improvement in her BP since her Toprol-XL dosing was adjusted. She denies any associated dyspnea, chest pain, orthopnea, PND, lightheadedness, dizziness, or presyncope. Does have occasional lower extremity edema at the end of the day.   She does consume 3-4 cups of caffeinated tea per day. Denies any alcohol use.    Past Medical History:  Diagnosis Date  . Anxiety   . Arthritis   . Asthma   . Atrial fibrillation, persistent ([place])   . Chronic diastolic congestive heart failure ([place])   . Colon adenoma   . Colon cancer ([place])    status post low anterior resection, limited stage disease requiring no adjuvant therapy  . COPD (chronic obstructive pulmonary disease) ([place])   . Depression   . Diverticulosis   . DM (dermatomyositis)   . DVT (deep venous  thrombosis) ([name])    in leg- long time ago  . Dyspnea    with activity  . Dysrhythmia   . Esophageal dysphagia   . GERD (gastroesophageal reflux disease)   . Headache   . Heart murmur   . Hematuria   . Hemorrhoids   . Hiatal hernia   . History of kidney stones    x 2  . Hypercholesterolemia   . Incidental pulmonary nodule 05/08/2016   8 mm vague opacity RML noted on CT scan  . Mitral regurgitation   . PONV (postoperative nausea and vomiting) 2003 ish    with breast biopsy  . S/P minimally invasive maze operation for atrial fibrillation 07/05/2016   Complete bilateral atrial lesion set using cryothermy and bipolar radiofrequency ablation with clipping of LA appendage via right mini thoracotomy approach  . 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 Schatzki's ring   . Tricuspid regurgitation     Past Surgical History:  Procedure Laterality Date  . APPENDECTOMY    . BREAST SURGERY Right 2003ish   biopsy  . CARDIAC CATHETERIZATION N/A 04/12/2016   Procedure: Right/Left Heart Cath and Coronary Angiography;  Surgeon: Leonie Man, MD;  Location: [facility];  Service: Cardiovascular;  Laterality: N/A;  .  CATARACT EXTRACTION Bilateral 2017  . CLIPPING OF ATRIAL APPENDAGE  07/05/2016   Procedure: CLIPPING OF ATRIAL APPENDAGE;  Surgeon: Rexene Alberts, MD;  Location: [facility];  Service: Open Heart Surgery;;  . COLONOSCOPY  11/09   Dr. Gala Romney- external hemorrhoidal tags o/w normal rectal mucosa, s/p surgical resection with a normal appearing anastomosis 12cm, pan colonic diverticulum  . COLONOSCOPY N/A 06/02/2012   EVO:[id number] post low anterior resection. Pancolonic diverticulosis. Colonic polyp-tubular adenoma. Surveillance due 2019.   Marland Kitchen COLONOSCOPY N/A 10/13/2015   Procedure: COLONOSCOPY;  Surgeon: Daneil Dolin,  MD;  [name];  Service: Endoscopy;  Laterality: N/A;  0930  . ESOPHAGOGASTRODUODENOSCOPY  07/2007   Dr. Daiva Nakayama cervical esophageal web, schatzki ring, large hiatal hernia  . INGUNAL HERNIA REPAIR Left   . LOW ANTERIOR BOWEL RESECTION     NO ADJ CHEMO  . MINIMALLY INVASIVE MAZE PROCEDURE N/A 07/05/2016   Procedure: MINIMALLY INVASIVE MAZE PROCEDURE;  Surgeon: Rexene Alberts, MD;  Location: [facility];  Service: Open Heart Surgery;  Laterality: N/A;  . MITRAL VALVE REPAIR Right 07/05/2016   Procedure: MINIMALLY INVASIVE MITRAL VALVE REPAIR (MVR);  Surgeon: Rexene Alberts, MD;  Location: [facility];  Service: Open Heart Surgery;  Laterality: Right;  . MULTIPLE EXTRACTIONS WITH ALVEOLOPLASTY N/A 05/21/2016   Procedure: MULTIPLE EXTRACTION OF TOOTH #'S 5, 21 WITH ALVEOLOPLASTY AND GROSS DEBRIDEMENT OF TEETH;  Surgeon: Lenn Cal, DDS;  Location: [facility];  Service: Oral Surgery;  Laterality: N/A;  . TEE WITHOUT CARDIOVERSION N/A 02/20/2016   Procedure: TRANSESOPHAGEAL ECHOCARDIOGRAM (TEE);  Surgeon: Arnoldo Lenis, MD;  [name];  Service: Endoscopy;  Laterality: N/A;  . TEE WITHOUT CARDIOVERSION N/A 07/05/2016   Procedure: TRANSESOPHAGEAL ECHOCARDIOGRAM (TEE);  Surgeon: Rexene Alberts, MD;  Location: [facility];  Service: Open Heart Surgery;  Laterality: N/A;  . TOOTH EXTRACTION    . TUBAL LIGATION    . VEIN LIGATION AND STRIPPING      Current Medications: Outpatient Medications Prior to Visit  Medication Sig Dispense Refill  . albuterol (PROVENTIL HFA;VENTOLIN HFA) 108 (90 BASE) MCG/ACT inhaler Inhale 2 puffs into the lungs every 6 (six) hours as needed for wheezing.    Marland Kitchen ALPRAZolam (XANAX) 0.5 MG tablet Take 0.5 mg by mouth 2 (two) times daily as needed (for anxiety/sleep (scheduled at bedtime)).     Marland Kitchen aspirin EC 81 MG EC tablet Take 1 tablet (81 mg total) by mouth daily.    . calcium carbonate (OS-CAL) 600 MG TABS tablet Take 600 mg by mouth 2 (two) times daily  with a meal.    . Coenzyme Q10 100 MG TABS Take 100 mg by mouth every evening.     . docusate sodium (COLACE) 100 MG capsule Take 200 mg by mouth daily as needed for mild constipation.     . furosemide (LASIX) 40 MG tablet Take 1 tablet (40 mg total) by mouth 2 (two) times daily. 30 tablet 1  . hydrocortisone (ANUSOL-HC) 2.5 % rectal cream Place 1 application rectally 2 (two) times daily. FOR UP TO 10 DAYS AT A TIME (Patient taking differently: Place 1 application rectally 2 (two) times daily as needed for hemorrhoids or itching. FOR UP TO 10 DAYS AT A TIME) 30 g 1  . Multiple Vitamin (MULTIVITAMIN) tablet Take 1 tablet by mouth daily.      Marland Kitchen omeprazole (PRILOSEC) 20 MG capsule Take 1 capsule (20 mg total) by mouth daily. 30 capsule 3  .  PARoxetine (PAXIL) 20 MG tablet Take 20 mg by mouth daily.     Vladimir Faster Glycol-Propyl Glycol (SYSTANE ULTRA) 0.4-0.3 % SOLN Place 1 drop into both eyes 3 (three) times daily.    . potassium chloride SA (K-DUR,KLOR-CON) 20 MEQ tablet Take 1 tablet (20 mEq total) by mouth 2 (two) times daily. 180 tablet 3  . simvastatin (ZOCOR) 40 MG tablet Take 40 mg daily by mouth.    . sodium chloride (OCEAN) 0.65 % SOLN nasal spray Place 1 spray into both nostrils as needed for congestion.    . traMADol (ULTRAM) 50 MG tablet Take 50 mg by mouth every 4-6 hours PRN severe pain. 30 tablet 0  . umeclidinium-vilanterol (ANORO ELLIPTA) 62.5-25 MCG/INH AEPB Inhale 1 puff into the lungs daily. 30 each 6  . warfarin (COUMADIN) 2.5 MG tablet Take 1 tablet daily except 1 1/2 tablets on Sundays, Tuesdays and Thursdays 60 tablet 3  . metoprolol succinate (TOPROL-XL) 50 MG 24 hr tablet Take 1 tablet (50 mg total) 2 (two) times daily by mouth. 60 tablet 11   No facility-administered medications prior to visit.      Allergies:   Iohexol; Hydrocodone-acetaminophen; Nabumetone; and Prednisone   Social History   Socioeconomic History  . Marital status: Married    Spouse name: None  .  Number of children: 2  . Years of education: None  . Highest education level: None  Social Needs  . Financial resource strain: None  . Food insecurity - worry: None  . Food insecurity - inability: None  . Transportation needs - medical: None  . Transportation needs - non-medical: None  Occupational History  . None  Tobacco Use  . Smoking status: Never Smoker  . Smokeless tobacco: Never Used  Substance and Sexual Activity  . Alcohol use: No    Alcohol/week: 0.0 oz    Comment: Rarely  . Drug use: No  . Sexual activity: None  Other Topics Concern  . None  Social History Narrative  . None     Family History:  The patient's family history includes Aneurysm in her brother; Brain cancer in her sister; Breast cancer in her mother; Cancer - Lung in her sister; Prostate cancer in her brother; Rheum arthritis in her father.   Review of Systems:   Please see the history of present illness.     General:  No chills, fever, night sweats or weight changes.  Cardiovascular:  No chest pain, dyspnea on exertion, edema, orthopnea, paroxysmal nocturnal dyspnea. Positive for palpitations.  Dermatological: No rash, lesions/masses Respiratory: No cough, dyspnea Urologic: No hematuria, dysuria Abdominal:   No nausea, vomiting, diarrhea, bright red blood per rectum, melena, or hematemesis Neurologic:  No visual changes, wkns, changes in mental status. All other systems reviewed and are otherwise negative except as noted above.   Physical Exam:    VS:  BP 122/66   Pulse (!) 110   Ht 5\' 2"  (1.575 m)   Wt 149 lb (67.6 kg)   SpO2 97%   BMI 27.25 kg/m    General: Well developed, well nourished Caucasian female appearing in no acute distress. Head: Normocephalic, atraumatic, sclera non-icteric, no xanthomas, nares are without discharge.  Neck: No carotid bruits. JVD not elevated.  Lungs: Respirations regular and unlabored, without wheezes or rales.  Heart: Tachycardiac rate and regular rhythm  with occasional ectopic beats. No S3 or S4.  No murmur, no rubs, or gallops appreciated. Abdomen: Soft, non-tender, non-distended with normoactive bowel sounds. No hepatomegaly.  No rebound/guarding. No obvious abdominal masses. Msk:  Strength and tone appear normal for age. No joint deformities or effusions. Extremities: No clubbing or cyanosis. No lower extremity edema.  Distal pedal pulses are 2+ bilaterally. Neuro: Alert and oriented X 3. Moves all extremities spontaneously. No focal deficits noted. Psych:  Responds to questions appropriately with a normal affect. Skin: No rashes or lesions noted  Wt Readings from Last 3 Encounters:  03/13/17 149 lb (67.6 kg)  02/27/17 144 lb 3.2 oz (65.4 kg)  01/28/17 145 lb 12.8 oz (66.1 kg)     Studies/Labs Reviewed:   EKG:  EKG is ordered today.  The ekg ordered today demonstrates atrial tachycardia, HR 115 with occasional PVC's.   Recent Labs: 07/03/2016: ALT 19 07/27/2016: Hemoglobin 9.9; Platelets 469; TSH 3.327 12/20/2016: BUN 15; Creatinine, Ser 0.77; Magnesium 2.1; Potassium 4.3; Sodium 139   Lipid Panel No results found for: CHOL, TRIG, HDL, CHOLHDL, VLDL, LDLCALC, LDLDIRECT  Additional studies/ records that were reviewed today include:   Echocardiogram: 10/2016 Study Conclusions  - Left ventricle: The cavity size was normal. Wall thickness was   normal. Systolic function was normal. The estimated ejection   fraction was in the range of 60% to 65%. Wall motion was normal;   there were no regional wall motion abnormalities. The study is   not technically sufficient to allow evaluation of LV diastolic   function. - Aortic valve: Moderately calcified annulus. Trileaflet. - Mitral valve: Status post complex valvuloplasty including   artificial Gore-tex neochord placement and Sorin Memo 3D Ring   Annuloplasty (size 11mm, catalog #[id number], serial [id number]). There   was trivial regurgitation. Mean gradient (D): 4 mm Hg. - Left atrium: The  atrium was mildly dilated. - Right ventricle: The cavity size was mildly dilated. Systolic   function appears mildly reduced. - Right atrium: The atrium was mildly to moderately dilated. - Tricuspid valve: There was moderate regurgitation. - Pulmonary arteries: PA peak pressure: 43 mm Hg (S).  Assessment:    1. Atrial tachycardia ([place])   2. PAF (paroxysmal atrial fibrillation) ([name])   3. Current use of long term anticoagulation   4. S/P mitral valve repair   5. Chronic diastolic congestive heart failure ([place])      Plan:   In order of problems listed above:  1. Paroxysmal Atrial Fibrillation/ Atrial Tachycardia - the patient recently underwent the Maze procedure in 06/2016 with atrial appendage clipping at that time. Was maintaining NSR but at the time of her office visit on 11/7, she reported worsening palpitations and an elevated HR, found to be back in atrial fibrillation with HR in the 110's. Toprol-XL dosing was increased at that time.  - she reports her HR has remained elevated at 110-115 bpm since her last office visit. Notes palpitations but denies any associated dyspnea, chest pain, orthopnea, PND, lightheadedness, dizziness, or presyncope.  - EKG today is most consistent with an atrial tachycardia, HR at 115 beats per minute. Reviewed with Dr. [name]. Will recheck TSH, BMET, and Mg levels. She is currently on Toprol-XL 50mg  BID. Will increase to 100mg  in AM and 50mg  in PM. Recommended she limit her caffeine intake. Does not consume alcohol. - she will continue to monitor her HR at home and report back if HR remains elevated despite medication titration. Will arrange for close follow-up in 2 weeks.   2. Use of Long-Term Anticoagulation - she denies any evidence of active bleeding. Remains on Coumadin for anticoagulation.   3. Severe MR -  s/p MVR in 06/2016.  - valve was functioning normally by echo in 09/2016.  4. Chronic Diastolic CHF - EF was preserved at 60-65% by her  most recent echocardiogram.  - she does not appear volume overloaded by physical examination. Continue Lasix at current dosing. Will recheck BMET as above.   5. COPD - followed by PCP. Reports her breathing is at baseline.   Medication Adjustments/Labs and Tests Ordered: Current medicines are reviewed at length with the patient today.  Concerns regarding medicines are outlined above.  Medication changes, Labs and Tests ordered today are listed in the Patient Instructions below. Patient Instructions  Medication Instructions:  Your physician has recommended you make the following change in your medication:  Increase Toprol XL to 100 mg in the Morning and 50 mg in the Evening    Labwork: Your physician recommends that you return for lab work in: Today   Testing/Procedures: NONE  Follow-Up: Your physician recommends that you schedule a follow-up appointment in: [place] with Dr. [name]   Any Other Special Instructions Will Be Listed Below (If Applicable).   If you need a refill on your cardiac medications before your next appointment, please call your pharmacy. Thank you for choosing [facility]!  Signed, Erma Heritage, PA-C  03/13/2017 2:17 PM    [facility] [place], [place], [place]  [address] Phone: [phone]; Fax: [phone]  [address], [place], [place] [address] Phone: [phone]

## 2017-03-13 ENCOUNTER — Other Ambulatory Visit (HOSPITAL_COMMUNITY)
Admission: RE | Admit: 2017-03-13 | Discharge: 2017-03-13 | Disposition: A | Discharge status: Home / Self Care | Payer: Medicare Other | Source: Ambulatory Visit | Attending: Student | Admitting: Student

## 2017-03-13 ENCOUNTER — Ambulatory Visit (INDEPENDENT_AMBULATORY_CARE_PROVIDER_SITE_OTHER): Payer: Medicare Other | Admitting: Student

## 2017-03-13 ENCOUNTER — Encounter: Payer: Self-pay | Admitting: Student

## 2017-03-13 VITALS — BP 122/66 | HR 110 | Ht 62.0 in | Wt 149.0 lb

## 2017-03-13 DIAGNOSIS — Z9889 Other specified postprocedural states: Secondary | ICD-10-CM | POA: Diagnosis not present

## 2017-03-13 DIAGNOSIS — I480 Paroxysmal atrial fibrillation: Secondary | ICD-10-CM | POA: Diagnosis not present

## 2017-03-13 DIAGNOSIS — I471 Supraventricular tachycardia: Secondary | ICD-10-CM | POA: Insufficient documentation

## 2017-03-13 DIAGNOSIS — Z7901 Long term (current) use of anticoagulants: Secondary | ICD-10-CM

## 2017-03-13 DIAGNOSIS — I4719 Other supraventricular tachycardia: Secondary | ICD-10-CM

## 2017-03-13 DIAGNOSIS — I5032 Chronic diastolic (congestive) heart failure: Secondary | ICD-10-CM | POA: Diagnosis not present

## 2017-03-13 LAB — BASIC METABOLIC PANEL
ANION GAP: 5 (ref 5–15)
BUN: 15 mg/dL (ref 6–20)
CO2: 29 mmol/L (ref 22–32)
CREATININE: 0.94 mg/dL (ref 0.44–1.00)
Calcium: 8.7 mg/dL — ABNORMAL LOW (ref 8.9–10.3)
Chloride: 104 mmol/L (ref 101–111)
GFR, EST NON AFRICAN AMERICAN: 57 mL/min — AB (ref >60)
Glucose, Bld: 82 mg/dL (ref 65–99)
Potassium: 4.3 mmol/L (ref 3.5–5.1)
SODIUM: 138 mmol/L (ref 135–145)

## 2017-03-13 LAB — MAGNESIUM: MAGNESIUM: 2.1 mg/dL (ref 1.7–2.4)

## 2017-03-13 LAB — TSH: TSH: 2.7 u[IU]/mL (ref 0.350–4.500)

## 2017-03-13 MED ORDER — METOPROLOL SUCCINATE ER 50 MG PO TB24: ORAL_TABLET | ORAL | 3 refills | Status: DC

## 2017-03-13 NOTE — Patient Instructions (Signed)
Medication Instructions:  Your physician has recommended you make the following change in your medication:  Increase Toprol XL to 100 mg in the Morning and 50 mg in the Evening    Labwork: Your physician recommends that you return for lab work in: Today    Testing/Procedures: NONE  Follow-Up: Your physician recommends that you schedule a follow-up appointment in: [place] with Dr. [name]   Any Other Special Instructions Will Be Listed Below (If Applicable).     If you need a refill on your cardiac medications before your next appointment, please call your pharmacy. Thank you for choosing [facility]!

## 2017-03-18 ENCOUNTER — Telehealth: Payer: Self-pay | Admitting: Cardiology

## 2017-03-18 MED ORDER — METOPROLOL SUCCINATE ER 100 MG PO TB24: 100.0000 mg | ORAL_TABLET | Freq: Two times a day (BID) | ORAL | 3 refills | Status: DC

## 2017-03-18 NOTE — Telephone Encounter (Signed)
  Yes, her HR does appear to be remaining elevated. We just increased her Toprol-XL to 100mg  in AM and 50mg  in PM five days ago. Can increase to 100mg  BID with BP remaining stable. Labs at that time showed no acute abnormalities. Please keep follow-up with Dr. [name] for next week (or move up if sooner appointment with [name] is available).   Thanks,  [place]

## 2017-03-18 NOTE — Telephone Encounter (Signed)
Pt will increase Toprol XL to 100 mg twice a day.She will call back with update

## 2017-03-18 NOTE — Telephone Encounter (Signed)
Patient reports BP 117/84, HR 110, 120/70 HR 114  Reports more SOB when she goes outside and wanted you to know

## 2017-03-18 NOTE — Telephone Encounter (Signed)
Please give pt a call concerning BP readings  

## 2017-03-19 ENCOUNTER — Other Ambulatory Visit: Payer: Self-pay | Admitting: Adult Health

## 2017-03-19 DIAGNOSIS — M1711 Unilateral primary osteoarthritis, right knee: Secondary | ICD-10-CM | POA: Diagnosis not present

## 2017-03-25 ENCOUNTER — Other Ambulatory Visit: Payer: Self-pay

## 2017-03-25 ENCOUNTER — Ambulatory Visit (INDEPENDENT_AMBULATORY_CARE_PROVIDER_SITE_OTHER): Payer: Medicare Other | Admitting: Cardiology

## 2017-03-25 ENCOUNTER — Encounter: Payer: Self-pay | Admitting: Cardiology

## 2017-03-25 VITALS — BP 102/70 | HR 106 | Ht 62.0 in | Wt 149.0 lb

## 2017-03-25 DIAGNOSIS — Z9889 Other specified postprocedural states: Secondary | ICD-10-CM

## 2017-03-25 DIAGNOSIS — I5032 Chronic diastolic (congestive) heart failure: Secondary | ICD-10-CM | POA: Diagnosis not present

## 2017-03-25 DIAGNOSIS — I480 Paroxysmal atrial fibrillation: Secondary | ICD-10-CM

## 2017-03-25 DIAGNOSIS — I340 Nonrheumatic mitral (valve) insufficiency: Secondary | ICD-10-CM

## 2017-03-25 DIAGNOSIS — I471 Supraventricular tachycardia: Secondary | ICD-10-CM

## 2017-03-25 MED ORDER — DILTIAZEM HCL 30 MG PO TABS: 30.0000 mg | ORAL_TABLET | Freq: Two times a day (BID) | ORAL | 6 refills | Status: DC | PRN

## 2017-03-25 NOTE — Progress Notes (Signed)
Clinical Summary [name] [name] is a 76 y.o.female seen today for follow up of the following medical problems.     1. Mitral regurgitation - echo at last [facility] Jan 2017 showed normal LVEF, 3+(modarte)eccentric MR with immobile posterior leaflet.  - repeat echo 01/2016 moderate to severe MR. LVEF 60-65%. LVIDs 31. Symptoms unchanged since last visit.  - 01/2016 TEE moderate to severe eccentric MR, severe TR.  - she is s/p mitral valve repair 07/05/16, Sorin Memo 3D Ring Annuloplasty (size 28mm, catalog #[id number], serial [id number])     - repeat echo 10/2016 with LVEF 60-65%, no WMAs, normal MV repair and ring, moderate TR.  - goal weight 145-150 - she has completed cardiac rehab  - no recent LE edema, SOB, DOE.  2.Paroxysmal afib - occasional palpiations, about once month. Lasts for a few minutes - s/p MAZE procedure during recent MV repair - no recurrent afib noted.   3. COPD - followed by Dr Luan Pulling   4. HTN - compliant with meds   5. Atrial tachcyardia - recent issues with palpitations, elevated heart rates - Toprol XL has been increased, up to 100mg  bid. - symptoms improved, but rates still remain low 100s   Past Medical History:  Diagnosis Date  . Anxiety   . Arthritis   . Asthma   . Atrial fibrillation, persistent ([place])   . Chronic diastolic congestive heart failure ([place])   . Colon adenoma   . Colon cancer ([place])    status post low anterior resection, limited stage disease requiring no adjuvant therapy  . COPD (chronic obstructive pulmonary disease) ([place])   . Depression   . Diverticulosis   . DM (dermatomyositis)   . DVT (deep venous thrombosis) ([name])    in leg- long time ago  . Dyspnea    with activity  . Dysrhythmia   . Esophageal dysphagia   . GERD (gastroesophageal reflux disease)   . Headache   . Heart murmur   . Hematuria   . Hemorrhoids   . Hiatal hernia   . History of kidney stones    x 2  . Hypercholesterolemia   .  Incidental pulmonary nodule 05/08/2016   8 mm vague opacity RML noted on CT scan  . Mitral regurgitation   . PONV (postoperative nausea and vomiting) 2003 ish    with breast biopsy  . S/P minimally invasive maze operation for atrial fibrillation 07/05/2016   Complete bilateral atrial lesion set using cryothermy and bipolar radiofrequency ablation with clipping of LA appendage via right mini thoracotomy approach  . 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 Schatzki's ring   . Tricuspid regurgitation      Allergies  Allergen Reactions  . Iohexol Hives and Shortness Of Breath     Pt. had a severe allergic reaction to IV contrast the last time she was injected and had to be seen in the ER.   Marland Kitchen Hydrocodone-Acetaminophen Hives  . Nabumetone Rash  . Prednisone Rash     Current Outpatient Medications  Medication Sig Dispense Refill  . albuterol (PROVENTIL HFA;VENTOLIN HFA) 108 (90 BASE) MCG/ACT inhaler Inhale 2 puffs into the lungs every 6 (six) hours as needed for wheezing.    Marland Kitchen ALPRAZolam (XANAX) 0.5 MG tablet Take 0.5 mg by mouth 2 (two) times daily as needed (for anxiety/sleep (scheduled at bedtime)).     Marland Kitchen  aspirin EC 81 MG EC tablet Take 1 tablet (81 mg total) by mouth daily.    . calcium carbonate (OS-CAL) 600 MG TABS tablet Take 600 mg by mouth 2 (two) times daily with a meal.    . Coenzyme Q10 100 MG TABS Take 100 mg by mouth every evening.     . docusate sodium (COLACE) 100 MG capsule Take 200 mg by mouth daily as needed for mild constipation.     . furosemide (LASIX) 40 MG tablet Take 1 tablet (40 mg total) by mouth 2 (two) times daily. 30 tablet 1  . hydrocortisone (ANUSOL-HC) 2.5 % rectal cream Place 1 application rectally 2 (two) times daily. FOR UP TO 10 DAYS AT A TIME (Patient taking differently: Place 1  application rectally 2 (two) times daily as needed for hemorrhoids or itching. FOR UP TO 10 DAYS AT A TIME) 30 g 1  . metoprolol succinate (TOPROL-XL) 100 MG 24 hr tablet Take 1 tablet (100 mg total) by mouth 2 (two) times daily. Take with or immediately following a meal. 180 tablet 3  . Multiple Vitamin (MULTIVITAMIN) tablet Take 1 tablet by mouth daily.      Marland Kitchen omeprazole (PRILOSEC) 20 MG capsule Take 1 capsule (20 mg total) by mouth daily. 30 capsule 3  . PARoxetine (PAXIL) 20 MG tablet Take 20 mg by mouth daily.     Vladimir Faster Glycol-Propyl Glycol (SYSTANE ULTRA) 0.4-0.3 % SOLN Place 1 drop into both eyes 3 (three) times daily.    . potassium chloride SA (K-DUR,KLOR-CON) 20 MEQ tablet Take 1 tablet (20 mEq total) by mouth 2 (two) times daily. 180 tablet 3  . simvastatin (ZOCOR) 40 MG tablet Take 40 mg daily by mouth.    . sodium chloride (OCEAN) 0.65 % SOLN nasal spray Place 1 spray into both nostrils as needed for congestion.    . traMADol (ULTRAM) 50 MG tablet Take 50 mg by mouth every 4-6 hours PRN severe pain. 30 tablet 0  . umeclidinium-vilanterol (ANORO ELLIPTA) 62.5-25 MCG/INH AEPB Inhale 1 puff into the lungs daily. 30 each 6  . warfarin (COUMADIN) 2.5 MG tablet Take 2 tablets daily except 1 1/2 tablets on Mondays, Wednesdays and Fridays 60 tablet 3   No current facility-administered medications for this visit.      Past Surgical History:  Procedure Laterality Date  . APPENDECTOMY    . BREAST SURGERY Right 2003ish   biopsy  . CARDIAC CATHETERIZATION N/A 04/12/2016   Procedure: Right/Left Heart Cath and Coronary Angiography;  Surgeon: Leonie Man, MD;  Location: [place] [facility];  Service: Cardiovascular;  Laterality: N/A;  . CATARACT EXTRACTION Bilateral 2017  . CLIPPING OF ATRIAL APPENDAGE  07/05/2016   Procedure: CLIPPING OF ATRIAL APPENDAGE;  Surgeon: Rexene Alberts, MD;  Location: [place];  Service: Open Heart Surgery;;  . COLONOSCOPY  11/09   Dr. Gala Romney- external  hemorrhoidal tags o/w normal rectal mucosa, s/p surgical resection with a normal appearing anastomosis 12cm, pan colonic diverticulum  . COLONOSCOPY N/A 06/02/2012   MEQ:ASTMHD post low anterior resection. Pancolonic diverticulosis. Colonic polyp-tubular adenoma. Surveillance due 2019.   Marland Kitchen COLONOSCOPY N/A 10/13/2015   Procedure: COLONOSCOPY;  Surgeon: Daneil Dolin, MD;  [name];  Service: Endoscopy;  Laterality: N/A;  0930  . ESOPHAGOGASTRODUODENOSCOPY  07/2007   Dr. Daiva Nakayama cervical esophageal web, schatzki ring, large hiatal hernia  . INGUNAL HERNIA REPAIR Left   . LOW ANTERIOR BOWEL RESECTION     NO ADJ  CHEMO  . MINIMALLY INVASIVE MAZE PROCEDURE N/A 07/05/2016   Procedure: MINIMALLY INVASIVE MAZE PROCEDURE;  Surgeon: Rexene Alberts, MD;  Location: [place];  Service: Open Heart Surgery;  Laterality: N/A;  . MITRAL VALVE REPAIR Right 07/05/2016   Procedure: MINIMALLY INVASIVE MITRAL VALVE REPAIR (MVR);  Surgeon: Rexene Alberts, MD;  Location: [place];  Service: Open Heart Surgery;  Laterality: Right;  . MULTIPLE EXTRACTIONS WITH ALVEOLOPLASTY N/A 05/21/2016   Procedure: MULTIPLE EXTRACTION OF TOOTH #'S 5, 21 WITH ALVEOLOPLASTY AND GROSS DEBRIDEMENT OF TEETH;  Surgeon: Lenn Cal, DDS;  Location: [place];  Service: Oral Surgery;  Laterality: N/A;  . TEE WITHOUT CARDIOVERSION N/A 02/20/2016   Procedure: TRANSESOPHAGEAL ECHOCARDIOGRAM (TEE);  Surgeon: Arnoldo Lenis, MD;  [name];  Service: Endoscopy;  Laterality: N/A;  . TEE WITHOUT CARDIOVERSION N/A 07/05/2016   Procedure: TRANSESOPHAGEAL ECHOCARDIOGRAM (TEE);  Surgeon: Rexene Alberts, MD;  Location: [place];  Service: Open Heart Surgery;  Laterality: N/A;  . TOOTH EXTRACTION    . TUBAL LIGATION    . VEIN LIGATION AND STRIPPING       Allergies  Allergen Reactions  . Iohexol Hives and Shortness Of Breath     Pt. had a severe allergic reaction to IV contrast the last time she was injected and had to  be seen in the ER.   Marland Kitchen Hydrocodone-Acetaminophen Hives  . Nabumetone Rash  . Prednisone Rash      Family History  Problem Relation Age of Onset  . Breast cancer Mother   . Rheum arthritis Father   . Cancer - Lung Sister   . Brain cancer Sister   . Prostate cancer Brother   . Aneurysm Brother   . Colon cancer Neg Hx      Social History [name] [name] reports that  has never smoked. she has never used smokeless tobacco. [name] [name] reports that she does not drink alcohol.   Review of Systems CONSTITUTIONAL: No weight loss, fever, chills, weakness or fatigue.  HEENT: Eyes: No visual loss, blurred vision, double vision or yellow sclerae.No hearing loss, sneezing, congestion, runny nose or sore throat.  SKIN: No rash or itching.  CARDIOVASCULAR: per hpi RESPIRATORY: per hpi GASTROINTESTINAL: No anorexia, nausea, vomiting or diarrhea. No abdominal pain or blood.  GENITOURINARY: No burning on urination, no polyuria NEUROLOGICAL: No headache, dizziness, syncope, paralysis, ataxia, numbness or tingling in the extremities. No change in bowel or bladder control.  MUSCULOSKELETAL: No muscle, back pain, joint pain or stiffness.  LYMPHATICS: No enlarged nodes. No history of splenectomy.  PSYCHIATRIC: No history of depression or anxiety.  ENDOCRINOLOGIC: No reports of sweating, cold or heat intolerance. No polyuria or polydipsia.  Marland Kitchen   Physical Examination Vitals:   03/25/17 0843  BP: 102/70  Pulse: (!) 106  SpO2: 95%   Vitals:   03/25/17 0843  Weight: 149 lb (67.6 kg)  Height: 5\' 2"  (1.575 m)    Gen: resting comfortably, no acute distress HEENT: no scleral icterus, pupils equal round and reactive, no palptable cervical adenopathy,  CV: RRR, n om/r/g, no jvd Resp: Clear to auscultation bilaterally GI: abdomen is soft, non-tender, non-distended, normal bowel sounds, no hepatosplenomegaly MSK: extremities are warm, no edema.  Skin: warm, no rash Neuro:  no focal  deficits Psych: appropriate affect   Diagnostic Studies 01/2016 echo Study Conclusions  - Left ventricle: The cavity size was normal. Wall thickness was normal. Systolic function was normal. The estimated ejection fraction was in the  range of 60% to 65%. Wall motion was normal; there were no regional wall motion abnormalities. The study is not technically sufficient to allow evaluation of LV diastolic function. Doppler parameters are consistent with high ventricular filling pressure. - Mitral valve: Calcified annulus. There was moderate to severe (closer to severe) eccentric regurgitation. - Left atrium: The atrium was severely dilated. - Right atrium: The atrium was moderately dilated. - Tricuspid valve: There was moderate-severe regurgitation. - Pulmonary arteries: PA peak pressure: 40 mm Hg (S).   01/2016 TEE eccentric moderate to severe MR. Moderate to severe TR. F/u full report for final findings.  08/22/16 echo  Study Conclusions  - Left ventricle: The cavity size was normal. Wall thickness was normal. Systolic function was normal. The estimated ejection fraction was in the range of 60% to 65%. Wall motion was normal; there were no regional wall motion abnormalities. The study is not technically sufficient to allow evaluation of LV diastolic function. - Aortic valve: Mildly calcified annulus. Trileaflet. Valve area (VTI): 1.6 cm^2. Valve area (Vmax): 1.59 cm^2. Valve area (Vmean): 1.61 cm^2. - Mitral valve: Status post complex valvuloplasty including artificial Gore-tex neochord placement and Sorin Memo 3D Ring Annuloplasty (size 22mm, catalog #[id number], serial [id number]). Mildly thickened leaflets. There was trivial regurgitation. Mean gradient (D): 4 mm Hg. - Left atrium: The atrium was mildly to moderately dilated. - Right ventricle: The cavity size was mildly dilated. - Right atrium: The atrium was at the upper limits of normal  in size. Central venous pressure (est): 3 mm Hg. - Atrial septum: No defect or patent foramen ovale was identified. - Tricuspid valve: There was moderate regurgitation. - Pulmonary arteries: Systolic pressure was moderately increased. PA peak pressure: 50 mm Hg (S). - Pericardium, extracardiac: There was no pericardial effusion.  Impressions:  - Normal LV wall thickness with LVEF 60-65%. Indeterminate diastolic function. Mild to moderate left atrial enlargement. Status post mitral valve repair and annuloplasty as indicated with mildly thickened leaflets and trivial mitral regurgitation. Mean gradient 4 mmHg. Mild right ventricular enlargement. At least moderate tricuspid regurgitation noted. Moderate pulmonary hypertension with PASP 50 mmHg.   10/2016 echo Study Conclusions  - Left ventricle: The cavity size was normal. Wall thickness was normal. Systolic function was normal. The estimated ejection fraction was in the range of 60% to 65%. Wall motion was normal; there were no regional wall motion abnormalities. The study is not technically sufficient to allow evaluation of LV diastolic function. - Aortic valve: Moderately calcified annulus. Trileaflet. - Mitral valve: Status post complex valvuloplasty including artificial Gore-tex neochord placement and Sorin Memo 3D Ring Annuloplasty (size 16mm, catalog #[id number], serial [id number]). There was trivial regurgitation. Mean gradient (D): 4 mm Hg. - Left atrium: The atrium was mildly dilated. - Right ventricle: The cavity size was mildly dilated. Systolic function appears mildly reduced. - Right atrium: The atrium was mildly to moderately dilated. - Tricuspid valve: There was moderate regurgitation. - Pulmonary arteries: PA peak pressure: 43 mm Hg (S).    Assessment and Plan  1. Mitral regurgitation/Mitral valve repair - s/p mitral valve repair - no symptoms, continue current meds  2.  PAF - no noted recurrence - continue current meds   3. COPD - follow with Dr Luan Pulling  4. Chronic diastolic HF - appears euvolemic, continue current meds  5. Atach - best seen by her 03/13/17 EKG - symptoms improved with uptitration of beta blocker, soft bp's limit further daily av nodal agents. Will add prn diltiazem for now, refer  to EP.         Arnoldo Lenis, M.D.

## 2017-03-25 NOTE — Patient Instructions (Signed)
Medication Instructions:   Begin Diltiazem 30mg  (short acting) twice a day as needed for palpitations.  Increase Lasix to 60mg  every morning & 40mg  every evening x 3 days only, then back to regular dose of 40mg  twice a day.    Continue all other medications.    Labwork: none  Testing/Procedures: none  Follow-Up: 6 weeks   Any Other Special Instructions Will Be Listed Below (If Applicable). You have been referred to:  EP - Dr. Lovena Le in our [place] office.    If you need a refill on your cardiac medications before your next appointment, please call your pharmacy.

## 2017-03-26 ENCOUNTER — Ambulatory Visit (INDEPENDENT_AMBULATORY_CARE_PROVIDER_SITE_OTHER): Payer: Medicare Other | Admitting: *Deleted

## 2017-03-26 ENCOUNTER — Encounter: Payer: Self-pay | Admitting: Cardiology

## 2017-03-26 DIAGNOSIS — I481 Persistent atrial fibrillation: Secondary | ICD-10-CM

## 2017-03-26 DIAGNOSIS — Z5181 Encounter for therapeutic drug level monitoring: Secondary | ICD-10-CM

## 2017-03-26 DIAGNOSIS — M159 Polyosteoarthritis, unspecified: Secondary | ICD-10-CM | POA: Diagnosis not present

## 2017-03-26 DIAGNOSIS — I4819 Other persistent atrial fibrillation: Secondary | ICD-10-CM

## 2017-03-26 DIAGNOSIS — J449 Chronic obstructive pulmonary disease, unspecified: Secondary | ICD-10-CM | POA: Diagnosis not present

## 2017-03-26 DIAGNOSIS — I4891 Unspecified atrial fibrillation: Secondary | ICD-10-CM | POA: Diagnosis not present

## 2017-03-26 DIAGNOSIS — Z9889 Other specified postprocedural states: Secondary | ICD-10-CM | POA: Diagnosis not present

## 2017-03-26 DIAGNOSIS — E7800 Pure hypercholesterolemia, unspecified: Secondary | ICD-10-CM | POA: Diagnosis not present

## 2017-03-26 LAB — POCT INR: INR: 3.7

## 2017-03-27 ENCOUNTER — Ambulatory Visit (INDEPENDENT_AMBULATORY_CARE_PROVIDER_SITE_OTHER): Payer: Medicare Other | Admitting: Internal Medicine

## 2017-03-27 ENCOUNTER — Encounter: Payer: Self-pay | Admitting: Internal Medicine

## 2017-03-27 VITALS — BP 100/80 | HR 111 | Ht 62.0 in | Wt 146.4 lb

## 2017-03-27 DIAGNOSIS — I471 Supraventricular tachycardia: Secondary | ICD-10-CM

## 2017-03-27 DIAGNOSIS — R002 Palpitations: Secondary | ICD-10-CM

## 2017-03-27 MED ORDER — AMIODARONE HCL 200 MG PO TABS: 200.0000 mg | ORAL_TABLET | Freq: Every day | ORAL | 3 refills | Status: DC

## 2017-03-27 MED ORDER — AMIODARONE HCL 200 MG PO TABS: 200.0000 mg | ORAL_TABLET | Freq: Two times a day (BID) | ORAL | 0 refills | Status: DC

## 2017-03-27 NOTE — Progress Notes (Signed)
HPI [name] [name] is referred today by Dr. [name] for evaluation of atrial tachycardia.  She is a 76 year old woman with a history of mitral valve disease status post mitral valve repair.  The patient actually has sinus node dysfunction which is fairly mild and resting heart rates in the 50s when she is in sinus rhythm.  Over the last 2 months, she is experienced the sensation of fast and hard heart beating.  No syncope.  Her dyspnea is class II.  She has had documented atrial tachycardia/flutter at 110 bpm.  Review of her EKGs demonstrates either 1-1 or 2-1 AV conduction.  The P wave is positive in lead V1, isoelectric in the inferior leads, and positive in lead aVR and slightly negative in lead aVL, suggestive of a left atrial arrhythmia. Allergies  Allergen Reactions  . Iohexol Hives and Shortness Of Breath     Pt. had a severe allergic reaction to IV contrast the last time she was injected and had to be seen in the ER.   Marland Kitchen Hydrocodone-Acetaminophen Hives  . Nabumetone Rash  . Prednisone Rash     Current Outpatient Medications  Medication Sig Dispense Refill  . albuterol (PROVENTIL HFA;VENTOLIN HFA) 108 (90 BASE) MCG/ACT inhaler Inhale 2 puffs into the lungs every 6 (six) hours as needed for wheezing.    Marland Kitchen ALPRAZolam (XANAX) 0.5 MG tablet Take 0.5 mg by mouth 2 (two) times daily as needed (for anxiety/sleep (scheduled at bedtime)).     Marland Kitchen aspirin EC 81 MG EC tablet Take 1 tablet (81 mg total) by mouth daily.    . calcium carbonate (OS-CAL) 600 MG TABS tablet Take 600 mg by mouth 2 (two) times daily with a meal.    . Coenzyme Q10 100 MG TABS Take 100 mg by mouth every evening.     . diltiazem (CARDIZEM) 30 MG tablet Take 1 tablet (30 mg total) by mouth 2 (two) times daily as needed (palpitations). 60 tablet 6  . docusate sodium (COLACE) 100 MG capsule Take 200 mg by mouth daily as needed for mild constipation.     . furosemide (LASIX) 40 MG tablet Take 1 tablet (40 mg total) by mouth  2 (two) times daily. 30 tablet 1  . hydrocortisone (ANUSOL-HC) 2.5 % rectal cream Place 1 application rectally 2 (two) times daily. FOR UP TO 10 DAYS AT A TIME (Patient taking differently: Place 1 application rectally 2 (two) times daily as needed for hemorrhoids or itching. FOR UP TO 10 DAYS AT A TIME) 30 g 1  . metoprolol succinate (TOPROL-XL) 100 MG 24 hr tablet Take 1 tablet (100 mg total) by mouth 2 (two) times daily. Take with or immediately following a meal. 180 tablet 3  . Multiple Vitamin (MULTIVITAMIN) tablet Take 1 tablet by mouth daily.      Marland Kitchen omeprazole (PRILOSEC) 20 MG capsule Take 1 capsule (20 mg total) by mouth daily. 30 capsule 3  . PARoxetine (PAXIL) 20 MG tablet Take 20 mg by mouth daily.     Vladimir Faster Glycol-Propyl Glycol (SYSTANE ULTRA) 0.4-0.3 % SOLN Place 1 drop into both eyes 3 (three) times daily.    . potassium chloride SA (K-DUR,KLOR-CON) 20 MEQ tablet Take 1 tablet (20 mEq total) by mouth 2 (two) times daily. 180 tablet 3  . simvastatin (ZOCOR) 40 MG tablet Take 40 mg daily by mouth.    . sodium chloride (OCEAN) 0.65 % SOLN nasal spray Place 1 spray into both nostrils as needed  for congestion.    . traMADol (ULTRAM) 50 MG tablet Take 50 mg by mouth every 4-6 hours PRN severe pain. 30 tablet 0  . umeclidinium-vilanterol (ANORO ELLIPTA) 62.5-25 MCG/INH AEPB Inhale 1 puff into the lungs daily. 30 each 6  . warfarin (COUMADIN) 2.5 MG tablet Take 2 tablets daily except 1 1/2 tablets on Mondays, Wednesdays and Fridays 60 tablet 3  . amiodarone (PACERONE) 200 MG tablet Take 1 tablet (200 mg total) by mouth 2 (two) times daily for 14 days. 28 tablet 0  . [START ON 04/11/2017] amiodarone (PACERONE) 200 MG tablet Take 1 tablet (200 mg total) by mouth daily. 90 tablet 3   No current facility-administered medications for this visit.      Past Medical History:  Diagnosis Date  . Anxiety   . Arthritis   . Asthma   . Atrial fibrillation, persistent ([facility])   . Chronic  diastolic congestive heart failure ([place])   . Colon adenoma   . Colon cancer ([place])    status post low anterior resection, limited stage disease requiring no adjuvant therapy  . COPD (chronic obstructive pulmonary disease) ([place])   . Depression   . Diverticulosis   . DM (dermatomyositis)   . DVT (deep venous thrombosis) ([name])    in leg- long time ago  . Dyspnea    with activity  . Dysrhythmia   . Esophageal dysphagia   . GERD (gastroesophageal reflux disease)   . Headache   . Heart murmur   . Hematuria   . Hemorrhoids   . Hiatal hernia   . History of kidney stones    x 2  . Hypercholesterolemia   . Incidental pulmonary nodule 05/08/2016   8 mm vague opacity RML noted on CT scan  . Mitral regurgitation   . PONV (postoperative nausea and vomiting) 2003 ish    with breast biopsy  . S/P minimally invasive maze operation for atrial fibrillation 07/05/2016   Complete bilateral atrial lesion set using cryothermy and bipolar radiofrequency ablation with clipping of LA appendage via right mini thoracotomy approach  . 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 Schatzki's ring   . Tricuspid regurgitation     ROS:   All systems reviewed and negative except as noted in the HPI.   Past Surgical History:  Procedure Laterality Date  . APPENDECTOMY    . BREAST SURGERY Right 2003ish   biopsy  . CARDIAC CATHETERIZATION N/A 04/12/2016   Procedure: Right/Left Heart Cath and Coronary Angiography;  Surgeon: Leonie Man, MD;  Location: [facility];  Service: Cardiovascular;  Laterality: N/A;  . CATARACT EXTRACTION Bilateral 2017  . CLIPPING OF ATRIAL APPENDAGE  07/05/2016   Procedure: CLIPPING OF ATRIAL APPENDAGE;  Surgeon: Rexene Alberts, MD;  Location: [facility];  Service: Open Heart Surgery;;  . COLONOSCOPY  11/09   Dr.  Gala Romney- external hemorrhoidal tags o/w normal rectal mucosa, s/p surgical resection with a normal appearing anastomosis 12cm, pan colonic diverticulum  . COLONOSCOPY N/A 06/02/2012   ZDG:LOVFIE post low anterior resection. Pancolonic diverticulosis. Colonic polyp-tubular adenoma. Surveillance due 2019.   Marland Kitchen COLONOSCOPY N/A 10/13/2015   Procedure: COLONOSCOPY;  Surgeon: Daneil Dolin, MD;  [name];  Service: Endoscopy;  Laterality: N/A;  0930  . ESOPHAGOGASTRODUODENOSCOPY  07/2007   Dr. Daiva Nakayama cervical esophageal web, schatzki ring, large  hiatal hernia  . INGUNAL HERNIA REPAIR Left   . LOW ANTERIOR BOWEL RESECTION     NO ADJ CHEMO  . MINIMALLY INVASIVE MAZE PROCEDURE N/A 07/05/2016   Procedure: MINIMALLY INVASIVE MAZE PROCEDURE;  Surgeon: Rexene Alberts, MD;  Location: [facility];  Service: Open Heart Surgery;  Laterality: N/A;  . MITRAL VALVE REPAIR Right 07/05/2016   Procedure: MINIMALLY INVASIVE MITRAL VALVE REPAIR (MVR);  Surgeon: Rexene Alberts, MD;  Location: [facility];  Service: Open Heart Surgery;  Laterality: Right;  . MULTIPLE EXTRACTIONS WITH ALVEOLOPLASTY N/A 05/21/2016   Procedure: MULTIPLE EXTRACTION OF TOOTH #'S 5, 21 WITH ALVEOLOPLASTY AND GROSS DEBRIDEMENT OF TEETH;  Surgeon: Lenn Cal, DDS;  Location: [facility];  Service: Oral Surgery;  Laterality: N/A;  . TEE WITHOUT CARDIOVERSION N/A 02/20/2016   Procedure: TRANSESOPHAGEAL ECHOCARDIOGRAM (TEE);  Surgeon: Arnoldo Lenis, MD;  [name];  Service: Endoscopy;  Laterality: N/A;  . TEE WITHOUT CARDIOVERSION N/A 07/05/2016   Procedure: TRANSESOPHAGEAL ECHOCARDIOGRAM (TEE);  Surgeon: Rexene Alberts, MD;  Location: [facility];  Service: Open Heart Surgery;  Laterality: N/A;  . TOOTH EXTRACTION    . TUBAL LIGATION    . VEIN LIGATION AND STRIPPING       Family History  Problem Relation Age of Onset  . Breast cancer Mother   . Rheum arthritis Father   . Cancer - Lung Sister   . Brain cancer Sister   .  Prostate cancer Brother   . Aneurysm Brother   . Colon cancer Neg Hx      Social History   Socioeconomic History  . Marital status: Married    Spouse name: Not on file  . Number of children: 2  . Years of education: Not on file  . Highest education level: Not on file  Social Needs  . Financial resource strain: Not on file  . Food insecurity - worry: Not on file  . Food insecurity - inability: Not on file  . Transportation needs - medical: Not on file  . Transportation needs - non-medical: Not on file  Occupational History  . Not on file  Tobacco Use  . Smoking status: Never Smoker  . Smokeless tobacco: Never Used  Substance and Sexual Activity  . Alcohol use: No    Alcohol/week: 0.0 oz    Comment: Rarely  . Drug use: No  . Sexual activity: Not on file  Other Topics Concern  . Not on file  Social History Narrative  . Not on file     BP 100/80   Pulse (!) 111   Ht 5\' 2"  (1.575 m)   Wt 146 lb 6.4 oz (66.4 kg)   SpO2 91%   BMI 26.78 kg/m   Physical Exam:  Well appearing 76 year old woman, NAD HEENT: Unremarkable Neck: 6 cm JVD, no thyromegally Lymphatics:  No adenopathy Back:  No CVA tenderness Lungs:  Clear, with no wheezes, rales, or rhonchi.   HEART:  Regular tachycardia rhythm, no murmurs, no rubs, no clicks Abd:  soft, positive bowel sounds, no organomegally, no rebound, no guarding Ext:  2 plus pulses, no edema, no cyanosis, no clubbing Skin:  No rashes no nodules Neuro:  CN II through XII intact, motor grossly intact  EKG -atrial tachycardia with one-to-one conduction versus atrial flutter with 2-1 AV conduction and frequent PVCs which are monomorphic  Assess/Plan: 1.  Sustained atrial tachycardia versus atypical flutter -I have discussed the various treatment options with the patient.  She  is symptomatic, and I have recommended initiation of amiodarone.  She will take 200 mg twice daily for 2 weeks then 200 mg daily.  When I see her back in a month,  I would anticipate cardioversion.  The patient is on systemic anticoagulation with warfarin.  I have asked her to return to our Coumadin clinic in 1 week to adjust her warfarin dose. 2.  Mitral regurgitation -she is status post valve repair and is doing well.  She has no residual MR on physical examination. 3.  Chronic diastolic heart failure -her symptoms are class II.  She will continue her current medication and she is encouraged to maintain a low-sodium diet.  My expectation is that she will have improvement in her symptoms when she is back in sinus rhythm. 4.  Sinus node dysfunction - I encouraged the patient Crissie Sickles, MD to take her amiodarone but I have also informed her that she may develop worsening sinus node dysfunction on this medication.  There is a possibility that she will end up with a pacemaker when she goes back to sinus rhythm secondary to bradycardia.  Crissie Sickles, MD

## 2017-03-27 NOTE — Patient Instructions (Signed)
Medication Instructions:  Your physician has recommended you make the following change in your medication:  1.  Start taking amiodarone.  A.  Take 200 mg amiodarone one tablet by mouth twice a day for the FIRST 2 WEEKS.  B.  Then reduce your dose of amiodarone to 200 mg (one tablet) by mouth daily.    Labwork: Get your PT/INR checked at the [facility] clinic in one week due to starting a new medication.  Your warfarin dose may need to be adjusted.  Testing/Procedures: None ordered.  Follow-Up: Your physician wants you to follow-up after the 1st of January in [place].  Any Other Special Instructions Will Be Listed Below (If Applicable).  Amiodarone tablets What is this medicine? AMIODARONE (a MEE oh da rone) is an antiarrhythmic drug. It helps make your heart beat regularly. Because of the side effects caused by this medicine, it is only used when other medicines have not worked. It is usually used for heartbeat problems that may be life threatening. This medicine may be used for other purposes; ask your health care provider or pharmacist if you have questions. COMMON BRAND NAME(S): Cordarone, Pacerone What should I tell my health care provider before I take this medicine? They need to know if you have any of these conditions: -liver disease -lung disease -other heart problems -thyroid disease -an unusual or allergic reaction to amiodarone, iodine, other medicines, foods, dyes, or preservatives -pregnant or trying to get pregnant -breast-feeding How should I use this medicine? Take this medicine by mouth with a glass of water. Follow the directions on the prescription label. You can take this medicine with or without food. However, you should always take it the same way each time. Take your doses at regular intervals. Do not take your medicine more often than directed. Do not stop taking except on the advice of your doctor or health care professional. A special MedGuide  will be given to you by the pharmacist with each prescription and refill. Be sure to read this information carefully each time. Talk to your pediatrician regarding the use of this medicine in children. Special care may be needed. Overdosage: If you think you have taken too much of this medicine contact a poison control center or emergency room at once. NOTE: This medicine is only for you. Do not share this medicine with others. What if I miss a dose? If you miss a dose, take it as soon as you can. If it is almost time for your next dose, take only that dose. Do not take double or extra doses. What may interact with this medicine? Do not take this medicine with any of the following medications: -abarelix -apomorphine -arsenic trioxide -certain antibiotics like erythromycin, gemifloxacin, levofloxacin, pentamidine -certain medicines for depression like amoxapine, tricyclic antidepressants -certain medicines for fungal infections like fluconazole, itraconazole, ketoconazole, posaconazole, voriconazole -certain medicines for irregular heart beat like disopyramide, dofetilide, dronedarone, ibutilide, propafenone, sotalol -certain medicines for malaria like chloroquine, halofantrine -cisapride -droperidol -haloperidol -hawthorn -maprotiline -methadone -phenothiazines like chlorpromazine, mesoridazine, thioridazine -pimozide -ranolazine -red yeast rice -vardenafil -ziprasidone This medicine may also interact with the following medications: -antiviral medicines for HIV or AIDS -certain medicines for blood pressure, heart disease, irregular heart beat -certain medicines for cholesterol like atorvastatin, cerivastatin, lovastatin, simvastatin -certain medicines for hepatitis C like sofosbuvir and ledipasvir; sofosbuvir -certain medicines for seizures like phenytoin -certain medicines for thyroid problems -certain medicines that treat or prevent blood clots like  warfarin -cholestyramine -cimetidine -clopidogrel -cyclosporine -dextromethorphan -diuretics -fentanyl -general  anesthetics -grapefruit juice -lidocaine -loratadine -methotrexate -other medicines that prolong the QT interval (cause an abnormal heart rhythm) -procainamide -quinidine -rifabutin, rifampin, or rifapentine -St. John's Wort -trazodone This list may not describe all possible interactions. Give your health care provider a list of all the medicines, herbs, non-prescription drugs, or dietary supplements you use. Also tell them if you smoke, drink alcohol, or use illegal drugs. Some items may interact with your medicine. What should I watch for while using this medicine? Your condition will be monitored closely when you first begin therapy. Often, this drug is first started in a hospital or other monitored health care setting. Once you are on maintenance therapy, visit your doctor or health care professional for regular checks on your progress. Because your condition and use of this medicine carry some risk, it is a good idea to carry an identification card, necklace or bracelet with details of your condition, medications, and doctor or health care professional. Dennis Bast may get drowsy or dizzy. Do not drive, use machinery, or do anything that needs mental alertness until you know how this medicine affects you. Do not stand or sit up quickly, especially if you are an older patient. This reduces the risk of dizzy or fainting spells. This medicine can make you more sensitive to the sun. Keep out of the sun. If you cannot avoid being in the sun, wear protective clothing and use sunscreen. Do not use sun lamps or tanning beds/booths. You should have regular eye exams before and during treatment. Call your doctor if you have blurred vision, see halos, or your eyes become sensitive to light. Your eyes may get dry. It may be helpful to use a lubricating eye solution or artificial tears  solution. If you are going to have surgery or a procedure that requires contrast dyes, tell your doctor or health care professional that you are taking this medicine. What side effects may I notice from receiving this medicine? Side effects that you should report to your doctor or health care professional as soon as possible: -allergic reactions like skin rash, itching or hives, swelling of the face, lips, or tongue -blue-gray coloring of the skin -blurred vision, seeing blue green halos, increased sensitivity of the eyes to light -breathing problems -chest pain -dark urine -fast, irregular heartbeat -feeling faint or light-headed -intolerance to heat or cold -nausea or vomiting -pain and swelling of the scrotum -pain, tingling, numbness in feet, hands -redness, blistering, peeling or loosening of the skin, including inside the mouth -spitting up blood -stomach pain -sweating -unusual or uncontrolled movements of body -unusually weak or tired -weight gain or loss -yellowing of the eyes or skin Side effects that usually do not require medical attention (report to your doctor or health care professional if they continue or are bothersome): -change in sex drive or performance -constipation -dizziness -headache -loss of appetite -trouble sleeping This list may not describe all possible side effects. Call your doctor for medical advice about side effects. You may report side effects to FDA at 1-800-FDA-1088. Where should I keep my medicine? Keep out of the reach of children. Store at room temperature between 20 and 25 degrees C (68 and 77 degrees F). Protect from light. Keep container tightly closed. Throw away any unused medicine after the expiration date. NOTE: This sheet is a summary. It may not cover all possible information. If you have questions about this medicine, talk to your doctor, pharmacist, or health care provider.  2018 Elsevier/Gold Standard (2013-07-13 19:48:11)  If  you need a refill on your cardiac medications before your next appointment, please call your pharmacy.

## 2017-04-04 ENCOUNTER — Ambulatory Visit (INDEPENDENT_AMBULATORY_CARE_PROVIDER_SITE_OTHER): Payer: Medicare Other | Admitting: *Deleted

## 2017-04-04 DIAGNOSIS — Z9889 Other specified postprocedural states: Secondary | ICD-10-CM

## 2017-04-04 DIAGNOSIS — Z5181 Encounter for therapeutic drug level monitoring: Secondary | ICD-10-CM

## 2017-04-04 DIAGNOSIS — I4819 Other persistent atrial fibrillation: Secondary | ICD-10-CM

## 2017-04-04 DIAGNOSIS — I481 Persistent atrial fibrillation: Secondary | ICD-10-CM | POA: Diagnosis not present

## 2017-04-04 LAB — POCT INR: INR: 2.3

## 2017-04-09 ENCOUNTER — Institutional Professional Consult (permissible substitution): Payer: Self-pay | Admitting: Internal Medicine

## 2017-04-09 ENCOUNTER — Ambulatory Visit (INDEPENDENT_AMBULATORY_CARE_PROVIDER_SITE_OTHER): Payer: Medicare Other | Admitting: *Deleted

## 2017-04-09 DIAGNOSIS — I4819 Other persistent atrial fibrillation: Secondary | ICD-10-CM

## 2017-04-09 DIAGNOSIS — Z9889 Other specified postprocedural states: Secondary | ICD-10-CM | POA: Diagnosis not present

## 2017-04-09 DIAGNOSIS — I481 Persistent atrial fibrillation: Secondary | ICD-10-CM | POA: Diagnosis not present

## 2017-04-09 DIAGNOSIS — Z5181 Encounter for therapeutic drug level monitoring: Secondary | ICD-10-CM | POA: Diagnosis not present

## 2017-04-09 LAB — POCT INR: INR: 3.4

## 2017-04-18 DIAGNOSIS — I5030 Unspecified diastolic (congestive) heart failure: Secondary | ICD-10-CM | POA: Diagnosis not present

## 2017-04-18 DIAGNOSIS — R911 Solitary pulmonary nodule: Secondary | ICD-10-CM | POA: Diagnosis not present

## 2017-04-18 DIAGNOSIS — J449 Chronic obstructive pulmonary disease, unspecified: Secondary | ICD-10-CM | POA: Diagnosis not present

## 2017-04-18 DIAGNOSIS — I480 Paroxysmal atrial fibrillation: Secondary | ICD-10-CM | POA: Diagnosis not present

## 2017-04-19 ENCOUNTER — Other Ambulatory Visit (HOSPITAL_COMMUNITY): Payer: Self-pay | Admitting: Pulmonary Disease

## 2017-04-19 DIAGNOSIS — H43813 Vitreous degeneration, bilateral: Secondary | ICD-10-CM | POA: Diagnosis not present

## 2017-04-19 DIAGNOSIS — R911 Solitary pulmonary nodule: Secondary | ICD-10-CM

## 2017-04-25 ENCOUNTER — Ambulatory Visit (INDEPENDENT_AMBULATORY_CARE_PROVIDER_SITE_OTHER): Payer: Medicare Other | Admitting: *Deleted

## 2017-04-25 DIAGNOSIS — Z9889 Other specified postprocedural states: Secondary | ICD-10-CM | POA: Diagnosis not present

## 2017-04-25 DIAGNOSIS — Z5181 Encounter for therapeutic drug level monitoring: Secondary | ICD-10-CM | POA: Diagnosis not present

## 2017-04-25 DIAGNOSIS — I481 Persistent atrial fibrillation: Secondary | ICD-10-CM

## 2017-04-25 DIAGNOSIS — I4819 Other persistent atrial fibrillation: Secondary | ICD-10-CM

## 2017-04-25 LAB — POCT INR: INR: 3.7

## 2017-04-25 NOTE — Patient Instructions (Signed)
Take 1 tablet tonight then decrease dose to 1 1/2 tablets daily except 2 tablets on Tuesdays and Saturdays.   Recheck in 1 week Started amiodarone 03/27/17   Decreased dose to 200mg  daily on 04/11/17 Recheck in 3 weeks

## 2017-04-30 ENCOUNTER — Encounter: Payer: Self-pay | Admitting: Internal Medicine

## 2017-05-10 ENCOUNTER — Ambulatory Visit: Payer: Self-pay | Admitting: Cardiology

## 2017-05-14 ENCOUNTER — Ambulatory Visit (INDEPENDENT_AMBULATORY_CARE_PROVIDER_SITE_OTHER): Payer: Medicare Other | Admitting: *Deleted

## 2017-05-14 DIAGNOSIS — Z5181 Encounter for therapeutic drug level monitoring: Secondary | ICD-10-CM | POA: Diagnosis not present

## 2017-05-14 DIAGNOSIS — Z9889 Other specified postprocedural states: Secondary | ICD-10-CM

## 2017-05-14 DIAGNOSIS — I481 Persistent atrial fibrillation: Secondary | ICD-10-CM

## 2017-05-14 DIAGNOSIS — I4819 Other persistent atrial fibrillation: Secondary | ICD-10-CM

## 2017-05-14 LAB — POCT INR: INR: 2.3

## 2017-05-14 NOTE — Patient Instructions (Signed)
Take 1 1/2 tablets tonight then resume 1 1/2 tablets daily except 2 tablets on Tuesdays and Saturdays.   Recheck in 2 weeks Started amiodarone 03/27/17   Decreased dose to 200mg  daily on 04/11/17

## 2017-05-20 ENCOUNTER — Encounter: Payer: Self-pay | Admitting: Nurse Practitioner

## 2017-05-20 ENCOUNTER — Ambulatory Visit (INDEPENDENT_AMBULATORY_CARE_PROVIDER_SITE_OTHER): Payer: Medicare Other | Admitting: Nurse Practitioner

## 2017-05-20 VITALS — BP 127/79 | HR 88 | Temp 96.4°F | Ht 62.0 in | Wt 147.0 lb

## 2017-05-20 DIAGNOSIS — K625 Hemorrhage of anus and rectum: Secondary | ICD-10-CM | POA: Diagnosis not present

## 2017-05-20 DIAGNOSIS — K649 Unspecified hemorrhoids: Secondary | ICD-10-CM | POA: Diagnosis not present

## 2017-05-20 NOTE — Progress Notes (Signed)
Referring Provider: Glenda Chroman, MD Primary Care Physician:  Glenda Chroman, MD Primary GI:  Dr. Gala Romney  Chief Complaint  Patient presents with  . Constipation  . Rectal Bleeding    HPI:   [name] [name] is a 77 y.o. female who presents for follow-up on constipation.  The patient was last seen in our office 01/28/2017 for the same as well as hemorrhoids.  She is chronically on anticoagulation for atrial fibrillation.  Colonoscopy up-to-date 2017 (5 mm semi-pedunculated polyp found to be sessile serrated polyp/adenoma without cytologic dysplasia) and recommend repeat 2020 if health permits.  Underwent minimally invasive mitral valve repair on 07/05/2016.  At her last visit she was doing well overall, heart surgery went well and she can tell a difference.  Constipation improved on Colace with addition of increased dietary fiber.  Still intermittent hemorrhoid bleeding on Coumadin about once 1-8 weeks but limited to scant toilet tissue hematochezia.  No other GI symptoms.  Recommended continue current medications including Anusol rectal cream, follow-up in 6 months.  Today she states she's doing ok overall. Constipation doing well, currently having 1-3 bowel movements daily, soft/pass easily ([place] 4). Still having rectal bleeding from her hemorrhoids. This occurs as hematochezia about once every 3-7 days (about 1-2 times a week). Is using rectal cream which helps a little. Having rectal stinging/burning. Bleeding is only with bowel movements. Pain starts with bowel movements and can last longer then during bowel movement. Denies persistent abdominal pain, N/V, melena, fever, chills, unintentional weight loss. Denies chest pain, dyspnea, dizziness, lightheadedness, syncope, near syncope. Denies any other upper or lower GI symptoms.  When she uses the cream she will use it "if I need it, after a bowel movement."  Colonoscopy noted grade 3/4 hemorrhoids.   Past Medical History:  Diagnosis  Date  . Anxiety   . Arthritis   . Asthma   . Atrial fibrillation, persistent ([place])   . Chronic diastolic congestive heart failure ([place])   . Colon adenoma   . Colon cancer ([place])    status post low anterior resection, limited stage disease requiring no adjuvant therapy  . COPD (chronic obstructive pulmonary disease) ([place])   . Depression   . Diverticulosis   . DM (dermatomyositis)   . DVT (deep venous thrombosis) ([name])    in leg- long time ago  . Dyspnea    with activity  . Dysrhythmia   . Esophageal dysphagia   . GERD (gastroesophageal reflux disease)   . Headache   . Heart murmur   . Hematuria   . Hemorrhoids   . Hiatal hernia   . History of kidney stones    x 2  . Hypercholesterolemia   . Incidental pulmonary nodule 05/08/2016   8 mm vague opacity RML noted on CT scan  . Mitral regurgitation   . PONV (postoperative nausea and vomiting) 2003 ish    with breast biopsy  . S/P minimally invasive maze operation for atrial fibrillation 07/05/2016   Complete bilateral atrial lesion set using cryothermy and bipolar radiofrequency ablation with clipping of LA appendage via right mini thoracotomy approach  . 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 Schatzki's ring   . Tricuspid regurgitation     Past Surgical History:  Procedure Laterality Date  . APPENDECTOMY    . BREAST SURGERY Right 2003ish  biopsy  . CARDIAC CATHETERIZATION N/A 04/12/2016   Procedure: Right/Left Heart Cath and Coronary Angiography;  Surgeon: Leonie Man, MD;  Location: [facility];  Service: Cardiovascular;  Laterality: N/A;  . CATARACT EXTRACTION Bilateral 2017  . CLIPPING OF ATRIAL APPENDAGE  07/05/2016   Procedure: CLIPPING OF ATRIAL APPENDAGE;  Surgeon: Rexene Alberts, MD;  Location: [facility];  Service: Open Heart Surgery;;  .  COLONOSCOPY  11/09   Dr. Gala Romney- external hemorrhoidal tags o/w normal rectal mucosa, s/p surgical resection with a normal appearing anastomosis 12cm, pan colonic diverticulum  . COLONOSCOPY N/A 06/02/2012   [id number] post low anterior resection. Pancolonic diverticulosis. Colonic polyp-tubular adenoma. Surveillance due 2019.   Marland Kitchen COLONOSCOPY N/A 10/13/2015   Procedure: COLONOSCOPY;  Surgeon: Daneil Dolin, MD;  [name];  Service: Endoscopy;  Laterality: N/A;  0930  . ESOPHAGOGASTRODUODENOSCOPY  07/2007   Dr. Daiva Nakayama cervical esophageal web, schatzki ring, large hiatal hernia  . INGUNAL HERNIA REPAIR Left   . LOW ANTERIOR BOWEL RESECTION     NO ADJ CHEMO  . MINIMALLY INVASIVE MAZE PROCEDURE N/A 07/05/2016   Procedure: MINIMALLY INVASIVE MAZE PROCEDURE;  Surgeon: Rexene Alberts, MD;  Location: [facility];  Service: Open Heart Surgery;  Laterality: N/A;  . MITRAL VALVE REPAIR Right 07/05/2016   Procedure: MINIMALLY INVASIVE MITRAL VALVE REPAIR (MVR);  Surgeon: Rexene Alberts, MD;  Location: [facility];  Service: Open Heart Surgery;  Laterality: Right;  . MULTIPLE EXTRACTIONS WITH ALVEOLOPLASTY N/A 05/21/2016   Procedure: MULTIPLE EXTRACTION OF TOOTH #'S 5, 21 WITH ALVEOLOPLASTY AND GROSS DEBRIDEMENT OF TEETH;  Surgeon: Lenn Cal, DDS;  Location: [facility];  Service: Oral Surgery;  Laterality: N/A;  . TEE WITHOUT CARDIOVERSION N/A 02/20/2016   Procedure: TRANSESOPHAGEAL ECHOCARDIOGRAM (TEE);  Surgeon: Arnoldo Lenis, MD;  [name];  Service: Endoscopy;  Laterality: N/A;  . TEE WITHOUT CARDIOVERSION N/A 07/05/2016   Procedure: TRANSESOPHAGEAL ECHOCARDIOGRAM (TEE);  Surgeon: Rexene Alberts, MD;  Location: [facility];  Service: Open Heart Surgery;  Laterality: N/A;  . TOOTH EXTRACTION    . TUBAL LIGATION    . VEIN LIGATION AND STRIPPING      Current Outpatient Medications  Medication Sig Dispense Refill  . albuterol (PROVENTIL HFA;VENTOLIN HFA) 108 (90 BASE) MCG/ACT  inhaler Inhale 2 puffs into the lungs every 6 (six) hours as needed for wheezing.    Marland Kitchen ALPRAZolam (XANAX) 0.5 MG tablet Take 0.5 mg by mouth 2 (two) times daily as needed (for anxiety/sleep (scheduled at bedtime)).     Marland Kitchen amiodarone (PACERONE) 200 MG tablet Take 1 tablet (200 mg total) by mouth daily. 90 tablet 3  . aspirin EC 81 MG EC tablet Take 1 tablet (81 mg total) by mouth daily.    . calcium carbonate (OS-CAL) 600 MG TABS tablet Take 600 mg by mouth 2 (two) times daily with a meal.    . Coenzyme Q10 100 MG TABS Take 100 mg by mouth every evening.     . diltiazem (CARDIZEM) 30 MG tablet Take 1 tablet (30 mg total) by mouth 2 (two) times daily as needed (palpitations). 60 tablet 6  . docusate sodium (COLACE) 100 MG capsule Take 200 mg by mouth daily as needed for mild constipation.     . furosemide (LASIX) 40 MG tablet Take 1 tablet (40 mg total) by mouth 2 (two) times daily. 30 tablet 1  . hydrocortisone (ANUSOL-HC) 2.5 % rectal cream Place 1 application rectally 2 (  two) times daily. FOR UP TO 10 DAYS AT A TIME (Patient taking differently: Place 1 application rectally 2 (two) times daily as needed for hemorrhoids or itching. FOR UP TO 10 DAYS AT A TIME) 30 g 1  . metoprolol succinate (TOPROL-XL) 100 MG 24 hr tablet Take 1 tablet (100 mg total) by mouth 2 (two) times daily. Take with or immediately following a meal. 180 tablet 3  . Multiple Vitamin (MULTIVITAMIN) tablet Take 1 tablet by mouth daily.      Marland Kitchen omeprazole (PRILOSEC) 20 MG capsule Take 1 capsule (20 mg total) by mouth daily. 30 capsule 3  . PARoxetine (PAXIL) 20 MG tablet Take 20 mg by mouth daily.     Vladimir Faster Glycol-Propyl Glycol (SYSTANE ULTRA) 0.4-0.3 % SOLN Place 1 drop into both eyes 3 (three) times daily.    . potassium chloride SA (K-DUR,KLOR-CON) 20 MEQ tablet Take 1 tablet (20 mEq total) by mouth 2 (two) times daily. 180 tablet 3  . simvastatin (ZOCOR) 40 MG tablet Take 40 mg daily by mouth.    . sodium chloride (OCEAN)  0.65 % SOLN nasal spray Place 1 spray into both nostrils as needed for congestion.    . traMADol (ULTRAM) 50 MG tablet Take 50 mg by mouth every 4-6 hours PRN severe pain. 30 tablet 0  . umeclidinium-vilanterol (ANORO ELLIPTA) 62.5-25 MCG/INH AEPB Inhale 1 puff into the lungs daily. 30 each 6  . warfarin (COUMADIN) 2.5 MG tablet Take 2 tablets daily except 1 1/2 tablets on Mondays, Wednesdays and Fridays (Patient taking differently: Take 2 tablets Saturday and Tuesday; 1 1/2 tablets all other days) 60 tablet 3  . amiodarone (PACERONE) 200 MG tablet Take 1 tablet (200 mg total) by mouth 2 (two) times daily for 14 days. (Patient taking differently: Take 200 mg by mouth daily. ) 28 tablet 0   No current facility-administered medications for this visit.     Allergies as of 05/20/2017 - Review Complete 05/20/2017  Allergen Reaction Noted  . Iohexol Hives and Shortness Of Breath 07/16/2008  . Hydrocodone-acetaminophen Hives 04/26/2010  . Nabumetone Rash 04/26/2010  . Prednisone Rash 10/13/2015    Family History  Problem Relation Age of Onset  . Breast cancer Mother   . Rheum arthritis Father   . Cancer - Lung Sister   . Brain cancer Sister   . Prostate cancer Brother   . Aneurysm Brother   . Colon cancer Neg Hx     Social History   Socioeconomic History  . Marital status: Married    Spouse name: None  . Number of children: 2  . Years of education: None  . Highest education level: None  Social Needs  . Financial resource strain: None  . Food insecurity - worry: None  . Food insecurity - inability: None  . Transportation needs - medical: None  . Transportation needs - non-medical: None  Occupational History  . None  Tobacco Use  . Smoking status: Never Smoker  . Smokeless tobacco: Never Used  Substance and Sexual Activity  . Alcohol use: No    Alcohol/week: 0.0 oz    Comment: Rarely  . Drug use: No  . Sexual activity: None  Other Topics Concern  . None  Social History  Narrative  . None    Review of Systems: General: Negative for anorexia, weight loss, fever, chills, fatigue, weakness. ENT: Negative for hoarseness, difficulty swallowing. CV: Negative for chest pain, angina, palpitations, peripheral edema.  Respiratory: Negative for dyspnea at  rest, cough, sputum, wheezing.  GI: See history of present illness. MS: Negative for joint pain, low back pain.  Derm: Negative for rash or itching.  Endo: Negative for unusual weight change.  Heme: Negative for bruising or bleeding.   Physical Exam: BP 127/79   Pulse 88   Temp (!) 96.4 F (35.8 C) (Oral)   Ht 5\' 2"  (1.575 m)   Wt 147 lb (66.7 kg)   BMI 26.89 kg/m  General:   Alert and oriented. Pleasant and cooperative. Well-nourished and well-developed.  Eyes:  Without icterus, sclera clear and conjunctiva pink.  Ears:  Normal auditory acuity. Cardiovascular:  S1, S2 present without murmurs appreciated. Extremities without clubbing or edema. Respiratory:  Clear to auscultation bilaterally. No wheezes, rales, or rhonchi. No distress.  Gastrointestinal:  +BS, soft, non-tender and non-distended. No HSM noted. No guarding or rebound. No masses appreciated.  Rectal:  Deferred  Musculoskalatal:  Symmetrical without gross deformities. Neurologic:  Alert and oriented x4;  grossly normal neurologically. Psych:  Alert and cooperative. Normal mood and affect. Heme/Lymph/Immune: No excessive bruising noted.    05/20/2017 3:58 PM   Disclaimer: This note was dictated with voice recognition software. Similar sounding words can inadvertently be transcribed and may not be corrected upon review.

## 2017-05-20 NOTE — Progress Notes (Deleted)
Referring Provider: Glenda Chroman, MD Primary Care Physician:  Glenda Chroman, MD Primary GI:  Dr. Gala Romney  No chief complaint on file.   HPI:   [name] [name] is a 77 y.o. female who presents for follow-up on constipation.  The patient was last seen in our office 01/28/2017 for the same as well as hemorrhoids.  She is chronically on anticoagulation for atrial fibrillation.  Colonoscopy up-to-date and recommend repeat 2020 if health permits.  Underwent minimally invasive mitral valve repair on 07/05/2016.  At her last visit she was doing well overall, heart surgery went well and she can tell a difference.  Constipation improved on Colace with increased dietary fiber addition.  Still intermittent hemorrhoid bleeding on Coumadin about once 1-8 weeks but limited to scant toilet tissue hematochezia.  No other GI symptoms.  Recommended continue current medications including Anusol rectal cream, follow-up in 6 months.  Today she states   Past Medical History:  Diagnosis Date  . Anxiety   . Arthritis   . Asthma   . Atrial fibrillation, persistent ([place])   . Chronic diastolic congestive heart failure ([place])   . Colon adenoma   . Colon cancer ([place])    status post low anterior resection, limited stage disease requiring no adjuvant therapy  . COPD (chronic obstructive pulmonary disease) ([place])   . Depression   . Diverticulosis   . DM (dermatomyositis)   . DVT (deep venous thrombosis) ([name])    in leg- long time ago  . Dyspnea    with activity  . Dysrhythmia   . Esophageal dysphagia   . GERD (gastroesophageal reflux disease)   . Headache   . Heart murmur   . Hematuria   . Hemorrhoids   . Hiatal hernia   . History of kidney stones    x 2  . Hypercholesterolemia   . Incidental pulmonary nodule 05/08/2016   8 mm vague opacity RML noted on CT scan  . Mitral regurgitation   . PONV (postoperative nausea and vomiting) 2003 ish    with breast biopsy  . S/P minimally invasive maze operation  for atrial fibrillation 07/05/2016   Complete bilateral atrial lesion set using cryothermy and bipolar radiofrequency ablation with clipping of LA appendage via right mini thoracotomy approach  . 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 Schatzki's ring   . Tricuspid regurgitation     Past Surgical History:  Procedure Laterality Date  . APPENDECTOMY    . BREAST SURGERY Right 2003ish   biopsy  . CARDIAC CATHETERIZATION N/A 04/12/2016   Procedure: Right/Left Heart Cath and Coronary Angiography;  Surgeon: Leonie Man, MD;  Location: [facility];  Service: Cardiovascular;  Laterality: N/A;  . CATARACT EXTRACTION Bilateral 2017  . CLIPPING OF ATRIAL APPENDAGE  07/05/2016   Procedure: CLIPPING OF ATRIAL APPENDAGE;  Surgeon: Rexene Alberts, MD;  Location: [facility] [facility];  Service: Open Heart Surgery;;  . COLONOSCOPY  11/09   Dr. Gala Romney- external hemorrhoidal tags o/w normal rectal mucosa, s/p surgical resection with a normal appearing anastomosis 12cm, pan colonic diverticulum  . COLONOSCOPY N/A 06/02/2012   [id number] post low anterior resection. Pancolonic diverticulosis. Colonic polyp-tubular adenoma. Surveillance due 2019.   Marland Kitchen COLONOSCOPY N/A 10/13/2015   Procedure: COLONOSCOPY;  Surgeon: Daneil Dolin, MD;  [name];  Service: Endoscopy;  Laterality: N/A;  0930  . ESOPHAGOGASTRODUODENOSCOPY  07/2007   Dr. Daiva Nakayama cervical esophageal web, schatzki ring, large hiatal hernia  . INGUNAL HERNIA REPAIR Left   . LOW ANTERIOR BOWEL RESECTION     NO ADJ CHEMO  . MINIMALLY INVASIVE MAZE PROCEDURE N/A 07/05/2016   Procedure: MINIMALLY INVASIVE MAZE PROCEDURE;  Surgeon: Rexene Alberts, MD;  Location: [facility];  Service: Open Heart Surgery;  Laterality: N/A;  . MITRAL VALVE REPAIR Right 07/05/2016   Procedure:  MINIMALLY INVASIVE MITRAL VALVE REPAIR (MVR);  Surgeon: Rexene Alberts, MD;  Location: [facility] [place];  Service: Open Heart Surgery;  Laterality: Right;  . MULTIPLE EXTRACTIONS WITH ALVEOLOPLASTY N/A 05/21/2016   Procedure: MULTIPLE EXTRACTION OF TOOTH #'S 5, 21 WITH ALVEOLOPLASTY AND GROSS DEBRIDEMENT OF TEETH;  Surgeon: Lenn Cal, DDS;  Location: [facility];  Service: Oral Surgery;  Laterality: N/A;  . TEE WITHOUT CARDIOVERSION N/A 02/20/2016   Procedure: TRANSESOPHAGEAL ECHOCARDIOGRAM (TEE);  Surgeon: Arnoldo Lenis, MD;  [name];  Service: Endoscopy;  Laterality: N/A;  . TEE WITHOUT CARDIOVERSION N/A 07/05/2016   Procedure: TRANSESOPHAGEAL ECHOCARDIOGRAM (TEE);  Surgeon: Rexene Alberts, MD;  Location: [facility];  Service: Open Heart Surgery;  Laterality: N/A;  . TOOTH EXTRACTION    . TUBAL LIGATION    . VEIN LIGATION AND STRIPPING      Current Outpatient Medications  Medication Sig Dispense Refill  . albuterol (PROVENTIL HFA;VENTOLIN HFA) 108 (90 BASE) MCG/ACT inhaler Inhale 2 puffs into the lungs every 6 (six) hours as needed for wheezing.    Marland Kitchen ALPRAZolam (XANAX) 0.5 MG tablet Take 0.5 mg by mouth 2 (two) times daily as needed (for anxiety/sleep (scheduled at bedtime)).     Marland Kitchen amiodarone (PACERONE) 200 MG tablet Take 1 tablet (200 mg total) by mouth 2 (two) times daily for 14 days. (Patient taking differently: Take 200 mg by mouth daily. ) 28 tablet 0  . amiodarone (PACERONE) 200 MG tablet Take 1 tablet (200 mg total) by mouth daily. 90 tablet 3  . aspirin EC 81 MG EC tablet Take 1 tablet (81 mg total) by mouth daily.    . calcium carbonate (OS-CAL) 600 MG TABS tablet Take 600 mg by mouth 2 (two) times daily with a meal.    . Coenzyme Q10 100 MG TABS Take 100 mg by mouth every evening.     . diltiazem (CARDIZEM) 30 MG tablet Take 1 tablet (30 mg total) by mouth 2 (two) times daily as needed (palpitations). 60 tablet 6  . docusate sodium (COLACE) 100 MG capsule Take 200 mg by mouth  daily as needed for mild constipation.     . furosemide (LASIX) 40 MG tablet Take 1 tablet (40 mg total) by mouth 2 (two) times daily. 30 tablet 1  . hydrocortisone (ANUSOL-HC) 2.5 % rectal cream Place 1 application rectally 2 (two) times daily. FOR UP TO 10 DAYS AT A TIME (Patient taking differently: Place 1 application rectally 2 (two) times daily as needed for hemorrhoids or itching. FOR UP TO 10 DAYS AT A TIME) 30 g 1  . metoprolol succinate (TOPROL-XL) 100 MG 24 hr tablet Take 1 tablet (100 mg total) by mouth 2 (two) times daily. Take with or immediately following a meal. 180 tablet 3  . Multiple Vitamin (MULTIVITAMIN) tablet Take 1 tablet by mouth daily.      Marland Kitchen omeprazole (PRILOSEC) 20 MG capsule Take 1 capsule (20 mg total) by mouth daily. 30 capsule 3  .  PARoxetine (PAXIL) 20 MG tablet Take 20 mg by mouth daily.     Vladimir Faster Glycol-Propyl Glycol (SYSTANE ULTRA) 0.4-0.3 % SOLN Place 1 drop into both eyes 3 (three) times daily.    . potassium chloride SA (K-DUR,KLOR-CON) 20 MEQ tablet Take 1 tablet (20 mEq total) by mouth 2 (two) times daily. 180 tablet 3  . simvastatin (ZOCOR) 40 MG tablet Take 40 mg daily by mouth.    . sodium chloride (OCEAN) 0.65 % SOLN nasal spray Place 1 spray into both nostrils as needed for congestion.    . traMADol (ULTRAM) 50 MG tablet Take 50 mg by mouth every 4-6 hours PRN severe pain. 30 tablet 0  . umeclidinium-vilanterol (ANORO ELLIPTA) 62.5-25 MCG/INH AEPB Inhale 1 puff into the lungs daily. 30 each 6  . warfarin (COUMADIN) 2.5 MG tablet Take 2 tablets daily except 1 1/2 tablets on Mondays, Wednesdays and Fridays 60 tablet 3   No current facility-administered medications for this visit.     Allergies as of 05/20/2017 - Review Complete 03/27/2017  Allergen Reaction Noted  . Iohexol Hives and Shortness Of Breath 07/16/2008  . Hydrocodone-acetaminophen Hives 04/26/2010  . Nabumetone Rash 04/26/2010  . Prednisone Rash 10/13/2015    Family History    Problem Relation Age of Onset  . Breast cancer Mother   . Rheum arthritis Father   . Cancer - Lung Sister   . Brain cancer Sister   . Prostate cancer Brother   . Aneurysm Brother   . Colon cancer Neg Hx     Social History   Socioeconomic History  . Marital status: Married    Spouse name: Not on file  . Number of children: 2  . Years of education: Not on file  . Highest education level: Not on file  Social Needs  . Financial resource strain: Not on file  . Food insecurity - worry: Not on file  . Food insecurity - inability: Not on file  . Transportation needs - medical: Not on file  . Transportation needs - non-medical: Not on file  Occupational History  . Not on file  Tobacco Use  . Smoking status: Never Smoker  . Smokeless tobacco: Never Used  Substance and Sexual Activity  . Alcohol use: No    Alcohol/week: 0.0 oz    Comment: Rarely  . Drug use: No  . Sexual activity: Not on file  Other Topics Concern  . Not on file  Social History Narrative  . Not on file    Review of Systems: General: Negative for anorexia, weight loss, fever, chills, fatigue, weakness. Eyes: Negative for vision changes.  ENT: Negative for hoarseness, difficulty swallowing , nasal congestion. CV: Negative for chest pain, angina, palpitations, dyspnea on exertion, peripheral edema.  Respiratory: Negative for dyspnea at rest, dyspnea on exertion, cough, sputum, wheezing.  GI: See history of present illness. GU:  Negative for dysuria, hematuria, urinary incontinence, urinary frequency, nocturnal urination.  MS: Negative for joint pain, low back pain.  Derm: Negative for rash or itching.  Neuro: Negative for weakness, abnormal sensation, seizure, frequent headaches, memory loss, confusion.  Psych: Negative for anxiety, depression, suicidal ideation, hallucinations.  Endo: Negative for unusual weight change.  Heme: Negative for bruising or bleeding. Allergy: Negative for rash or  hives.   Physical Exam: There were no vitals taken for this visit. General:   Alert and oriented. Pleasant and cooperative. Well-nourished and well-developed.  Head:  Normocephalic and atraumatic. Eyes:  Without icterus, sclera clear and  conjunctiva pink.  Ears:  Normal auditory acuity. Mouth:  No deformity or lesions, oral mucosa pink.  Throat/Neck:  Supple, without mass or thyromegaly. Cardiovascular:  S1, S2 present without murmurs appreciated. Normal pulses noted. Extremities without clubbing or edema. Respiratory:  Clear to auscultation bilaterally. No wheezes, rales, or rhonchi. No distress.  Gastrointestinal:  +BS, soft, non-tender and non-distended. No HSM noted. No guarding or rebound. No masses appreciated.  Rectal:  Deferred  Musculoskalatal:  Symmetrical without gross deformities. Normal posture. Skin:  Intact without significant lesions or rashes. Neurologic:  Alert and oriented x4;  grossly normal neurologically. Psych:  Alert and cooperative. Normal mood and affect. Heme/Lymph/Immune: No significant cervical adenopathy. No excessive bruising noted.    05/20/2017 12:04 PM   Disclaimer: This note was dictated with voice recognition software. Similar sounding words can inadvertently be transcribed and may not be corrected upon review.

## 2017-05-20 NOTE — Assessment & Plan Note (Signed)
Colonoscopy up-to-date.  She has significant hemorrhoids as per above.  This is likely the cause of her intermittent rectal bleeding.  Further hemorrhoid management as per above.

## 2017-05-20 NOTE — Assessment & Plan Note (Addendum)
The patient's last colonoscopy demonstrated grade 3/4 hemorrhoids.  She does continue to have hemorrhoid symptoms.  She has rectal pain/stinging and burning that starts when she has a bowel movement and lasts quite a bit longer.  She has symptoms about every 3-7 days.  This is associated with scant toilet tissue hematochezia as per below.  She has been using Anusol but only uses it intermittently/occasionally.  I will send in [place] [facility] cream which is a mix of hydrocortisone and lidocaine to [facility].  I have discussed when she does have a day when she has symptoms go ahead and use the cream twice a day for at least 2 days until her symptoms subside and then she can stop.  Return for follow-up in 2 months.  As far as a more "permanent fix" she is not a candidate for hemorrhoid banding due to chronic anticoagulation on Coumadin for A. fib.  I am not sure if she would be a surgical candidate either as she has recently had heart surgery for valvular repair and has some comorbidities that would make her probably a higher risk.  Additionally, she is not wanting to consider surgery at this time.

## 2017-05-20 NOTE — Patient Instructions (Signed)
1. I sent the apothecary cream prescription to [facility] in [place]. 2. Unfortunately, the [place], [place] location of [facility] does not custom make medications. 3. They will call you to get the okay to mix the cream.  At that point it will take about an hour.  It would likely be best for you to pick it up another day when you are in [place]. 4. As we discussed, start using this cream when you experience symptoms.  He can take it twice a day for up to 10 days at a time.  I would try 1-2 days of twice a day application or until your symptoms subside rather than intermittent use. 5. Return for follow-up in 2 months. 6. Call us if you have any questions or concerns.

## 2017-05-21 ENCOUNTER — Encounter: Payer: Self-pay | Admitting: Internal Medicine

## 2017-05-21 NOTE — Progress Notes (Signed)
cc'd to pcp 

## 2017-05-22 DIAGNOSIS — E7800 Pure hypercholesterolemia, unspecified: Secondary | ICD-10-CM | POA: Diagnosis not present

## 2017-05-22 DIAGNOSIS — M159 Polyosteoarthritis, unspecified: Secondary | ICD-10-CM | POA: Diagnosis not present

## 2017-05-22 DIAGNOSIS — I4891 Unspecified atrial fibrillation: Secondary | ICD-10-CM | POA: Diagnosis not present

## 2017-05-22 DIAGNOSIS — J449 Chronic obstructive pulmonary disease, unspecified: Secondary | ICD-10-CM | POA: Diagnosis not present

## 2017-05-24 ENCOUNTER — Other Ambulatory Visit: Payer: Self-pay

## 2017-05-24 ENCOUNTER — Encounter: Payer: Self-pay | Admitting: Internal Medicine

## 2017-05-24 ENCOUNTER — Emergency Department (HOSPITAL_COMMUNITY): Payer: Medicare Other

## 2017-05-24 ENCOUNTER — Encounter (HOSPITAL_COMMUNITY): Payer: Self-pay

## 2017-05-24 ENCOUNTER — Ambulatory Visit (INDEPENDENT_AMBULATORY_CARE_PROVIDER_SITE_OTHER): Payer: Medicare Other | Admitting: Internal Medicine

## 2017-05-24 ENCOUNTER — Emergency Department (HOSPITAL_COMMUNITY)
Admission: EM | Admit: 2017-05-24 | Discharge: 2017-05-24 | Disposition: A | Discharge status: Home / Self Care | Payer: Medicare Other | Attending: Emergency Medicine | Admitting: Emergency Medicine

## 2017-05-24 VITALS — BP 122/78 | HR 60 | Ht 62.0 in | Wt 148.6 lb

## 2017-05-24 DIAGNOSIS — I4891 Unspecified atrial fibrillation: Secondary | ICD-10-CM | POA: Diagnosis not present

## 2017-05-24 DIAGNOSIS — J449 Chronic obstructive pulmonary disease, unspecified: Secondary | ICD-10-CM | POA: Diagnosis not present

## 2017-05-24 DIAGNOSIS — Z79899 Other long term (current) drug therapy: Secondary | ICD-10-CM | POA: Diagnosis not present

## 2017-05-24 DIAGNOSIS — W01190A Fall on same level from slipping, tripping and stumbling with subsequent striking against furniture, initial encounter: Secondary | ICD-10-CM | POA: Diagnosis not present

## 2017-05-24 DIAGNOSIS — J45909 Unspecified asthma, uncomplicated: Secondary | ICD-10-CM | POA: Diagnosis not present

## 2017-05-24 DIAGNOSIS — S2242XA Multiple fractures of ribs, left side, initial encounter for closed fracture: Secondary | ICD-10-CM

## 2017-05-24 DIAGNOSIS — Z7982 Long term (current) use of aspirin: Secondary | ICD-10-CM | POA: Diagnosis not present

## 2017-05-24 DIAGNOSIS — Y999 Unspecified external cause status: Secondary | ICD-10-CM | POA: Insufficient documentation

## 2017-05-24 DIAGNOSIS — Y92512 Supermarket, store or market as the place of occurrence of the external cause: Secondary | ICD-10-CM | POA: Insufficient documentation

## 2017-05-24 DIAGNOSIS — S59912A Unspecified injury of left forearm, initial encounter: Secondary | ICD-10-CM | POA: Diagnosis not present

## 2017-05-24 DIAGNOSIS — Z7901 Long term (current) use of anticoagulants: Secondary | ICD-10-CM | POA: Diagnosis not present

## 2017-05-24 DIAGNOSIS — S20302A Unspecified superficial injuries of left front wall of thorax, initial encounter: Secondary | ICD-10-CM | POA: Diagnosis present

## 2017-05-24 DIAGNOSIS — I481 Persistent atrial fibrillation: Secondary | ICD-10-CM

## 2017-05-24 DIAGNOSIS — Y9301 Activity, walking, marching and hiking: Secondary | ICD-10-CM | POA: Diagnosis not present

## 2017-05-24 DIAGNOSIS — S5012XA Contusion of left forearm, initial encounter: Secondary | ICD-10-CM | POA: Insufficient documentation

## 2017-05-24 DIAGNOSIS — I5032 Chronic diastolic (congestive) heart failure: Secondary | ICD-10-CM | POA: Diagnosis not present

## 2017-05-24 DIAGNOSIS — M79632 Pain in left forearm: Secondary | ICD-10-CM | POA: Diagnosis not present

## 2017-05-24 DIAGNOSIS — I4819 Other persistent atrial fibrillation: Secondary | ICD-10-CM

## 2017-05-24 DIAGNOSIS — S59902A Unspecified injury of left elbow, initial encounter: Secondary | ICD-10-CM | POA: Diagnosis not present

## 2017-05-24 LAB — PROTIME-INR
INR: 2.37
Prothrombin Time: 25.7 seconds — ABNORMAL HIGH (ref 11.4–15.2)

## 2017-05-24 MED ORDER — TRAMADOL HCL 50 MG PO TABS: 50.0000 mg | ORAL_TABLET | Freq: Four times a day (QID) | ORAL | 0 refills | Status: DC | PRN

## 2017-05-24 MED ORDER — ACETAMINOPHEN 500 MG PO TABS
1000.0000 mg | ORAL_TABLET | Freq: Once | ORAL | Status: AC
  Administered 2017-05-24: 1000 mg via ORAL
  Filled 2017-05-24: qty 2

## 2017-05-24 NOTE — Progress Notes (Signed)
HPI [name] [name] returns today for followup of her atrial arrhythmias. She has a h/o MV repair and developed rapid atrial flutter which is atypical and originating in the LA based on her P wave morphology. In the interim she feels ok and her HR has improved but still goes up. She has had no problems taking amiodarone and is on 200 mg daily. Minimal peripheral edema. Allergies  Allergen Reactions  . Iohexol Hives and Shortness Of Breath     Pt. had a severe allergic reaction to IV contrast the last time she was injected and had to be seen in the ER.   Marland Kitchen Hydrocodone-Acetaminophen Hives  . Nabumetone Rash  . Prednisone Rash     Current Outpatient Medications  Medication Sig Dispense Refill  . albuterol (PROVENTIL HFA;VENTOLIN HFA) 108 (90 BASE) MCG/ACT inhaler Inhale 2 puffs into the lungs every 6 (six) hours as needed for wheezing.    Marland Kitchen ALPRAZolam (XANAX) 0.5 MG tablet Take 0.5 mg by mouth 2 (two) times daily as needed (for anxiety/sleep (scheduled at bedtime)).     Marland Kitchen amiodarone (PACERONE) 200 MG tablet Take 1 tablet (200 mg total) by mouth daily. 90 tablet 3  . aspirin EC 81 MG EC tablet Take 1 tablet (81 mg total) by mouth daily.    . calcium carbonate (OS-CAL) 600 MG TABS tablet Take 600 mg by mouth 2 (two) times daily with a meal.    . Coenzyme Q10 100 MG TABS Take 100 mg by mouth every evening.     . diltiazem (CARDIZEM) 30 MG tablet Take 1 tablet (30 mg total) by mouth 2 (two) times daily as needed (palpitations). 60 tablet 6  . docusate sodium (COLACE) 100 MG capsule Take 200 mg by mouth daily as needed for mild constipation.     . furosemide (LASIX) 40 MG tablet Take 1 tablet (40 mg total) by mouth 2 (two) times daily. 30 tablet 1  . hydrocortisone (ANUSOL-HC) 2.5 % rectal cream Place 1 application rectally 2 (two) times daily. FOR UP TO 10 DAYS AT A TIME (Patient taking differently: Place 1 application rectally 2 (two) times daily as needed for hemorrhoids or itching. FOR UP  TO 10 DAYS AT A TIME) 30 g 1  . metoprolol succinate (TOPROL-XL) 100 MG 24 hr tablet Take 1 tablet (100 mg total) by mouth 2 (two) times daily. Take with or immediately following a meal. 180 tablet 3  . Multiple Vitamin (MULTIVITAMIN) tablet Take 1 tablet by mouth daily.      Marland Kitchen omeprazole (PRILOSEC) 20 MG capsule Take 1 capsule (20 mg total) by mouth daily. 30 capsule 3  . PARoxetine (PAXIL) 20 MG tablet Take 20 mg by mouth daily.     Vladimir Faster Glycol-Propyl Glycol (SYSTANE ULTRA) 0.4-0.3 % SOLN Place 1 drop into both eyes 3 (three) times daily.    . potassium chloride SA (K-DUR,KLOR-CON) 20 MEQ tablet Take 1 tablet (20 mEq total) by mouth 2 (two) times daily. 180 tablet 3  . simvastatin (ZOCOR) 40 MG tablet Take 40 mg daily by mouth.    . sodium chloride (OCEAN) 0.65 % SOLN nasal spray Place 1 spray into both nostrils as needed for congestion.    . traMADol (ULTRAM) 50 MG tablet Take 50 mg by mouth every 4-6 hours PRN severe pain. 30 tablet 0  . umeclidinium-vilanterol (ANORO ELLIPTA) 62.5-25 MCG/INH AEPB Inhale 1 puff into the lungs daily. 30 each 6  . warfarin (COUMADIN) 2.5 MG tablet  Take 2 tablets daily except 1 1/2 tablets on Mondays, Wednesdays and Fridays (Patient taking differently: Take 2 tablets Saturday and Tuesday; 1 1/2 tablets all other days) 60 tablet 3   No current facility-administered medications for this visit.      Past Medical History:  Diagnosis Date  . Anxiety   . Arthritis   . Asthma   . Atrial fibrillation, persistent ([place])   . Chronic diastolic congestive heart failure ([place])   . Colon adenoma   . Colon cancer ([place])    status post low anterior resection, limited stage disease requiring no adjuvant therapy  . COPD (chronic obstructive pulmonary disease) ([place])   . Depression   . Diverticulosis   . DM (dermatomyositis)   . DVT (deep venous thrombosis) ([name])    in leg- long time ago  . Dyspnea    with activity  . Dysrhythmia   . Esophageal dysphagia   .  GERD (gastroesophageal reflux disease)   . Headache   . Heart murmur   . Hematuria   . Hemorrhoids   . Hiatal hernia   . History of kidney stones    x 2  . Hypercholesterolemia   . Incidental pulmonary nodule 05/08/2016   8 mm vague opacity RML noted on CT scan  . Mitral regurgitation   . PONV (postoperative nausea and vomiting) 2003 ish    with breast biopsy  . S/P minimally invasive maze operation for atrial fibrillation 07/05/2016   Complete bilateral atrial lesion set using cryothermy and bipolar radiofrequency ablation with clipping of LA appendage via right mini thoracotomy approach  . 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 Schatzki's ring   . Tricuspid regurgitation     ROS:   All systems reviewed and negative except as noted in the HPI.   Past Surgical History:  Procedure Laterality Date  . APPENDECTOMY    . BREAST SURGERY Right 2003ish   biopsy  . CARDIAC CATHETERIZATION N/A 04/12/2016   Procedure: Right/Left Heart Cath and Coronary Angiography;  Surgeon: Leonie Man, MD;  Location: [facility];  Service: Cardiovascular;  Laterality: N/A;  . CATARACT EXTRACTION Bilateral 2017  . CLIPPING OF ATRIAL APPENDAGE  07/05/2016   Procedure: CLIPPING OF ATRIAL APPENDAGE;  Surgeon: Rexene Alberts, MD;  Location: [facility];  Service: Open Heart Surgery;;  . COLONOSCOPY  11/09   Dr. Gala Romney- external hemorrhoidal tags o/w normal rectal mucosa, s/p surgical resection with a normal appearing anastomosis 12cm, pan colonic diverticulum  . COLONOSCOPY N/A 06/02/2012   PVV:ZSMOLM post low anterior resection. Pancolonic diverticulosis. Colonic polyp-tubular adenoma. Surveillance due 2019.   Marland Kitchen COLONOSCOPY N/A 10/13/2015   Procedure: COLONOSCOPY;  Surgeon: Daneil Dolin, MD;  [name];  Service:  Endoscopy;  Laterality: N/A;  0930  . ESOPHAGOGASTRODUODENOSCOPY  07/2007   Dr. Daiva Nakayama cervical esophageal web, schatzki ring, large hiatal hernia  . INGUNAL HERNIA REPAIR Left   . LOW ANTERIOR BOWEL RESECTION     NO ADJ CHEMO  . MINIMALLY INVASIVE MAZE PROCEDURE N/A 07/05/2016   Procedure: MINIMALLY INVASIVE MAZE PROCEDURE;  Surgeon: Rexene Alberts, MD;  Location: [facility];  Service: Open Heart Surgery;  Laterality: N/A;  . MITRAL VALVE REPAIR Right 07/05/2016   Procedure: MINIMALLY INVASIVE MITRAL VALVE REPAIR (MVR);  Surgeon: Rexene Alberts, MD;  Location: [facility];  Service: Open Heart Surgery;  Laterality: Right;  . MULTIPLE EXTRACTIONS WITH ALVEOLOPLASTY N/A 05/21/2016   Procedure: MULTIPLE EXTRACTION OF TOOTH #'S 5, 21 WITH ALVEOLOPLASTY AND GROSS DEBRIDEMENT OF TEETH;  Surgeon: Lenn Cal, DDS;  Location: [facility];  Service: Oral Surgery;  Laterality: N/A;  . TEE WITHOUT CARDIOVERSION N/A 02/20/2016   Procedure: TRANSESOPHAGEAL ECHOCARDIOGRAM (TEE);  Surgeon: Arnoldo Lenis, MD;  [name];  Service: Endoscopy;  Laterality: N/A;  . TEE WITHOUT CARDIOVERSION N/A 07/05/2016   Procedure: TRANSESOPHAGEAL ECHOCARDIOGRAM (TEE);  Surgeon: Rexene Alberts, MD;  Location: [facility];  Service: Open Heart Surgery;  Laterality: N/A;  . TOOTH EXTRACTION    . TUBAL LIGATION    . VEIN LIGATION AND STRIPPING       Family History  Problem Relation Age of Onset  . Breast cancer Mother   . Rheum arthritis Father   . Cancer - Lung Sister   . Brain cancer Sister   . Prostate cancer Brother   . Aneurysm Brother   . Colon cancer Neg Hx      Social History   Socioeconomic History  . Marital status: Married    Spouse name: Not on file  . Number of children: 2  . Years of education: Not on file  . Highest education level: Not on file  Social Needs  . Financial resource strain: Not on file  . Food insecurity - worry: Not on file  . Food insecurity - inability: Not on file    . Transportation needs - medical: Not on file  . Transportation needs - non-medical: Not on file  Occupational History  . Not on file  Tobacco Use  . Smoking status: Never Smoker  . Smokeless tobacco: Never Used  Substance and Sexual Activity  . Alcohol use: No    Alcohol/week: 0.0 oz    Comment: Rarely  . Drug use: No  . Sexual activity: Not on file  Other Topics Concern  . Not on file  Social History Narrative  . Not on file     BP 122/78   Pulse 60   Ht 5\' 2"  (1.575 m)   Wt 148 lb 9.6 oz (67.4 kg)   SpO2 97% Comment: on room air  BMI 27.18 kg/m   Physical Exam:  Well appearing NAD HEENT: Unremarkable Neck:  No JVD, no thyromegally Lymphatics:  No adenopathy Back:  No CVA tenderness Lungs:  Clear HEART:  Regular rate rhythm, no murmurs, no rubs, no clicks Abd:  soft, positive bowel sounds, no organomegally, no rebound, no guarding Ext:  2 plus pulses, no edema, no cyanosis, no clubbing Skin:  No rashes no nodules Neuro:  CN II through XII intact, motor grossly intact  EKG -atrial flutter with a cvr  Assess/Plan: 1. Atypical atrial flutter - she is slower today and I have recommended she be scheduled for DCCV with Dr. [name] in the coming weeks. She will have her INR checked no Tuesday and if good, she will be set up for DCCV with Dr. [name]. 2. Sinus node dysfunction - I think she will be fine but I suspect that there is 1/10 or 1/20 of ending up with PPM after her DCCV. 3. HTN - her blood pressure is well controlled. She is encouraged to reduce her salt intake. 4. Coags - she has been therapeutic with INR. She is encouraged to avoid green leafy vegetables.  Mikle Bosworth.D.

## 2017-05-24 NOTE — ED Triage Notes (Signed)
Pt stated she fell at the [employer] and stumbled over a bed frame. Pt has bruises to both knees, left forearm, as well as left breast. Pt also has scratches on left forearm. Pt is in NAD. Pt did not hit head. Pt is on blood thinners, Coumadin.

## 2017-05-24 NOTE — Patient Instructions (Signed)
Medication Instructions:  Your physician recommends that you continue on your current medications as directed. Please refer to the Current Medication list given to you today.   Labwork: NONE  Testing/Procedures: NONE  Follow-Up: Your physician recommends that you schedule a follow-up appointment in: 3 Months with Dr. Taylor.   Any Other Special Instructions Will Be Listed Below (If Applicable).     If you need a refill on your cardiac medications before your next appointment, please call your pharmacy.  Thank you for choosing [facility]!   

## 2017-05-24 NOTE — Discharge Instructions (Signed)
Your x-ray today shows that you have 3 small rib fractures on the left side.  These will heal over the next month but may cause ongoing pain and difficulty breathing Use the breathing tube at that we have given you once an hour while you are awake to help take deep breaths, taking small breaths will cause possible pneumonia Tylenol for mild pain, tramadol for severe or worsening pain Emergency department for severe or worsening pain difficulty breathing fevers

## 2017-05-24 NOTE — ED Notes (Addendum)
Pt gone over to xray

## 2017-05-24 NOTE — ED Provider Notes (Signed)
[facility] EMERGENCY DEPARTMENT Provider Note   CSN: [id number] Arrival date & time: 05/24/17  1051     History   Chief Complaint Chief Complaint  Patient presents with  . Fall    HPI [name] [name] is a 76 y.o. female.  HPI   The patient is a 77 year old female, she has multiple medical problems including congestive heart failure, atrial fibrillation, she has had a valve repair and is currently taking Coumadin, she had a fall when she was at the [facility] just prior to arrival when she lost her footing and fell on top of the bed frame.  She struck her left forearm elbow and the left side of her ribs and complains of pain in those areas.  She does have pain with deep breathing.  She denies head injury neck pain or injury to her legs of any significant concern.  This occurred just prior to arrival, symptoms are constant, moderate, worse with palpation or deep breathing, not associated with numbness or weakness.  She was able to ambulate after the injury  Past Medical History:  Diagnosis Date  . Anxiety   . Arthritis   . Asthma   . Atrial fibrillation, persistent ([place])   . Chronic diastolic congestive heart failure ([place])   . Colon adenoma   . Colon cancer ([place])    status post low anterior resection, limited stage disease requiring no adjuvant therapy  . COPD (chronic obstructive pulmonary disease) ([place])   . Depression   . Diverticulosis   . DM (dermatomyositis)   . DVT (deep venous thrombosis) ([name])    in leg- long time ago  . Dyspnea    with activity  . Dysrhythmia   . Esophageal dysphagia   . GERD (gastroesophageal reflux disease)   . Headache   . Heart murmur   . Hematuria   . Hemorrhoids   . Hiatal hernia   . History of kidney stones    x 2  . Hypercholesterolemia   . Incidental pulmonary nodule 05/08/2016   8 mm vague opacity RML noted on CT scan  . Mitral regurgitation   . PONV (postoperative nausea and vomiting) 2003 ish    with breast biopsy   . S/P minimally invasive maze operation for atrial fibrillation 07/05/2016   Complete bilateral atrial lesion set using cryothermy and bipolar radiofrequency ablation with clipping of LA appendage via right mini thoracotomy approach  . 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 Schatzki's ring   . Tricuspid regurgitation     Patient Active Problem List   Diagnosis Date Noted  . Atrial tachycardia ([place]) 03/27/2017  . Hemorrhoids 01/28/2017  . Encounter for therapeutic drug monitoring 07/16/2016  . S/P minimally invasive mitral valve repair and maze procedure 07/05/2016  . S/P minimally invasive maze operation for atrial fibrillation 07/05/2016  . Chronic diastolic congestive heart failure ([place])   . COPD (chronic obstructive pulmonary disease) ([place])   . Incidental pulmonary nodule 05/08/2016  . Tricuspid regurgitation   . Shortness of breath   . Atrial fibrillation, persistent ([place])   . Preoperative cardiovascular examination 04/12/2016  . Mitral regurgitation   . History of colonic polyps   . Chronic diarrhea   . Personal history of colon cancer 09/15/2015  . Rectal bleeding 09/15/2015  . Abdominal pain 09/15/2015  . RUQ pain 12/16/2012  .  Urinary tract infection symptoms 12/16/2012  . Constipation 05/01/2010  . CARCINOMA IN SITU OF COLON 04/26/2010  . Type II diabetes mellitus ([place]) 04/26/2010  . HYPERCHOLESTEROLEMIA 04/26/2010  . EXTERNAL HEMORRHOIDS 04/26/2010  . GERD 04/26/2010  . DIVERTICULOSIS OF COLON 04/26/2010  . DYSPHAGIA PHARYNGEAL PHASE 04/26/2010    Past Surgical History:  Procedure Laterality Date  . APPENDECTOMY    . BREAST SURGERY Right 2003ish   biopsy  . CARDIAC CATHETERIZATION N/A 04/12/2016   Procedure: Right/Left Heart Cath and Coronary Angiography;  Surgeon: Leonie Man, MD;   Location: [place] [facility];  Service: Cardiovascular;  Laterality: N/A;  . CATARACT EXTRACTION Bilateral 2017  . CLIPPING OF ATRIAL APPENDAGE  07/05/2016   Procedure: CLIPPING OF ATRIAL APPENDAGE;  Surgeon: Rexene Alberts, MD;  Location: [place];  Service: Open Heart Surgery;;  . COLONOSCOPY  11/09   Dr. Gala Romney- external hemorrhoidal tags o/w normal rectal mucosa, s/p surgical resection with a normal appearing anastomosis 12cm, pan colonic diverticulum  . COLONOSCOPY N/A 06/02/2012   GMW:NUUVOZ post low anterior resection. Pancolonic diverticulosis. Colonic polyp-tubular adenoma. Surveillance due 2019.   Marland Kitchen COLONOSCOPY N/A 10/13/2015   Procedure: COLONOSCOPY;  Surgeon: Daneil Dolin, MD;  [name];  Service: Endoscopy;  Laterality: N/A;  0930  . ESOPHAGOGASTRODUODENOSCOPY  07/2007   Dr. Daiva Nakayama cervical esophageal web, schatzki ring, large hiatal hernia  . INGUNAL HERNIA REPAIR Left   . LOW ANTERIOR BOWEL RESECTION     NO ADJ CHEMO  . MINIMALLY INVASIVE MAZE PROCEDURE N/A 07/05/2016   Procedure: MINIMALLY INVASIVE MAZE PROCEDURE;  Surgeon: Rexene Alberts, MD;  Location: [place];  Service: Open Heart Surgery;  Laterality: N/A;  . MITRAL VALVE REPAIR Right 07/05/2016   Procedure: MINIMALLY INVASIVE MITRAL VALVE REPAIR (MVR);  Surgeon: Rexene Alberts, MD;  Location: [place];  Service: Open Heart Surgery;  Laterality: Right;  . MULTIPLE EXTRACTIONS WITH ALVEOLOPLASTY N/A 05/21/2016   Procedure: MULTIPLE EXTRACTION OF TOOTH #'S 5, 21 WITH ALVEOLOPLASTY AND GROSS DEBRIDEMENT OF TEETH;  Surgeon: Lenn Cal, DDS;  Location: [place];  Service: Oral Surgery;  Laterality: N/A;  . TEE WITHOUT CARDIOVERSION N/A 02/20/2016   Procedure: TRANSESOPHAGEAL ECHOCARDIOGRAM (TEE);  Surgeon: Arnoldo Lenis, MD;  [name];  Service: Endoscopy;  Laterality: N/A;  . TEE WITHOUT CARDIOVERSION N/A 07/05/2016   Procedure: TRANSESOPHAGEAL ECHOCARDIOGRAM (TEE);  Surgeon: Rexene Alberts,  MD;  Location: [place];  Service: Open Heart Surgery;  Laterality: N/A;  . TOOTH EXTRACTION    . TUBAL LIGATION    . VEIN LIGATION AND STRIPPING      OB History    No data available       Home Medications    Prior to Admission medications   Medication Sig Start Date End Date Taking? Authorizing Provider  albuterol (PROVENTIL HFA;VENTOLIN HFA) 108 (90 BASE) MCG/ACT inhaler Inhale 2 puffs into the lungs every 6 (six) hours as needed for wheezing.    [provider]  ALPRAZolam [name]) 0.5 MG tablet Take 0.5 mg by mouth 2 (two) times daily as needed (for anxiety/sleep (scheduled at bedtime)).     [provider]  amiodarone (PACERONE) 200 MG tablet Take 1 tablet (200 mg total) by mouth daily. 04/11/17   Evans Lance, MD  aspirin EC 81 MG EC tablet Take 1 tablet (81 mg total) by mouth daily. 07/14/16   Nani Skillern, PA-C  calcium carbonate (OS-CAL) 600 MG TABS tablet Take 600  mg by mouth 2 (two) times daily with a meal.    [provider]  Coenzyme Q10 100 MG TABS Take 100 mg by mouth every evening.     [provider]  diltiazem (CARDIZEM) 30 MG tablet Take 1 tablet (30 mg total) by mouth 2 (two) times daily as needed (palpitations). 03/25/17   Arnoldo Lenis, MD  docusate sodium (COLACE) 100 MG capsule Take 200 mg by mouth daily as needed for mild constipation.     [provider]  furosemide (LASIX) 40 MG tablet Take 1 tablet (40 mg total) by mouth 2 (two) times daily. 10/25/16   Arnoldo Lenis, MD  hydrocortisone (ANUSOL-HC) 2.5 % rectal cream Place 1 application rectally 2 (two) times daily. FOR UP TO 10 DAYS AT A TIME Patient taking differently: Place 1 application rectally 2 (two) times daily as needed for hemorrhoids or itching. FOR UP TO 10 DAYS AT A TIME 06/20/16   Carlis Stable, NP  metoprolol succinate (TOPROL-XL) 100 MG 24 hr tablet Take 1 tablet (100 mg total) by mouth 2 (two) times daily. Take with or immediately following  a meal. 03/18/17 06/16/17  Strader, Fransisco Hertz, PA-C  Multiple Vitamin (MULTIVITAMIN) tablet Take 1 tablet by mouth daily.      [provider]  omeprazole (PRILOSEC) 20 MG capsule Take 1 capsule (20 mg total) by mouth daily. 12/16/15   Carlis Stable, NP  PARoxetine (PAXIL) 20 MG tablet Take 20 mg by mouth daily.     [provider]  Polyethyl Glycol-Propyl Glycol (SYSTANE ULTRA) 0.4-0.3 % SOLN Place 1 drop into both eyes 3 (three) times daily.    [provider]  potassium chloride SA (K-DUR,KLOR-CON) 20 MEQ tablet Take 1 tablet (20 mEq total) by mouth 2 (two) times daily. 12/20/16   Arnoldo Lenis, MD  simvastatin (ZOCOR) 40 MG tablet Take 40 mg daily by mouth.    [provider]  sodium chloride (OCEAN) 0.65 % SOLN nasal spray Place 1 spray into both nostrils as needed for congestion.    [provider]  traMADol (ULTRAM) 50 MG tablet Take 50 mg by mouth every 4-6 hours PRN severe pain. 07/14/16   Lars Pinks M, PA-C  umeclidinium-vilanterol (ANORO ELLIPTA) 62.5-25 MCG/INH AEPB Inhale 1 puff into the lungs daily. 09/10/16   Arnoldo Lenis, MD  warfarin (COUMADIN) 2.5 MG tablet Take 2 tablets daily except 1 1/2 tablets on Mondays, Wednesdays and Fridays Patient taking differently: Take 2 tablets Saturday and Tuesday; 1 1/2 tablets all other days 03/19/17   Arnoldo Lenis, MD    Family History Family History  Problem Relation Age of Onset  . Breast cancer Mother   . Rheum arthritis Father   . Cancer - Lung Sister   . Brain cancer Sister   . Prostate cancer Brother   . Aneurysm Brother   . Colon cancer Neg Hx     Social History Social History   Tobacco Use  . Smoking status: Never Smoker  . Smokeless tobacco: Never Used  Substance Use Topics  . Alcohol use: No    Alcohol/week: 0.0 oz    Comment: Rarely  . Drug use: No     Allergies   Iohexol; Hydrocodone-acetaminophen; Nabumetone; and Prednisone   Review of  Systems Review of Systems  All other systems reviewed and are negative.    Physical Exam Updated Vital Signs BP (!) 138/92 (BP Location: Right Arm)   Pulse 81  Temp 97.6 F (36.4 C) (Oral)   Resp 15   Ht 5\' 2"  (1.575 m)   Wt 67.1 kg (148 lb)   SpO2 98%   BMI 27.07 kg/m   Physical Exam  Constitutional: She appears well-nourished. No distress.  HENT:  Head: Normocephalic and atraumatic.  no facial tenderness, deformity, malocclusion or hemotympanum.  no battle's sign or racoon eyes.   Eyes: Conjunctivae and EOM are normal. Pupils are equal, round, and reactive to light. Right eye exhibits no discharge. Left eye exhibits no discharge. No scleral icterus.  Neck: Normal range of motion. Neck supple.  Cardiovascular: Normal rate, regular rhythm and intact distal pulses.  Pulmonary/Chest: Effort normal and breath sounds normal. She exhibits tenderness.  Abdominal: Soft. There is no tenderness.  Musculoskeletal: She exhibits tenderness. She exhibits no edema.  Diffusely soft compartments, supple joints, range of motion of all major joints is normal, normal grips, able to straight leg raise bilaterally and she does have some tenderness over the left forearm associated with bruising, normal range of motion of the elbow.  There is tenderness over the left chest wall and ribs with some bruising anteriorly and laterally over the breast tissue  Neurological:  Speech is clear, movements are coordinated, strength is normal in all 4 extremities, cranial nerves III through XII are normal  Skin: Skin is warm and dry. No rash noted.  Nursing note and vitals reviewed.    ED Treatments / Results  Labs (all labs ordered are listed, but only abnormal results are displayed) Labs Reviewed  PROTIME-INR - Abnormal; Notable for the following components:      Result Value   Prothrombin Time 25.7 (*)    All other components within normal limits     Radiology Dg Ribs Unilateral W/chest  Left  Result Date: 05/24/2017 CLINICAL DATA:  Left chest and anterior rib pain after tripping and falling today. EXAM: LEFT RIBS AND CHEST - 3+ VIEW COMPARISON:  Chest dated 08/14/2016.  Chest CT dated 07/03/2016. FINDINGS: Stable enlarged cardiac silhouette, left atrial appendage clip, prosthetic heart valve and moderate-sized hiatal hernia. Decreased linear density in the right mid lung zone with decreased right pleural fluid. Interval small amount of ill-defined linear density in the left upper lung zone. Mildly displaced left anterior 9th rib fracture and mild angulation of the anterior left 7th rib without a visible fracture. Similar appearance involving the anterior aspect of the left 10th rib and there is also an old, healed fracture of the anterolateral aspect of the left 9th rib. No pneumothorax. IMPRESSION: 1. Mildly displaced left anterior 9th rib fracture in addition to an old, healed left 9th rib fracture. 2. Nondisplaced acute or old left 7th and 10th anterior rib fractures. 3. Interval small amount of linear atelectasis in the left upper lung zone. 4. Stable cardiomegaly and moderate-sized hiatal hernia. 5. Decreased right pleural fluid. Electronically Signed   By: Claudie Revering M.D.   On: 05/24/2017 13:01   Dg Elbow Complete Left (3+view)  Result Date: 05/24/2017 CLINICAL DATA:  Fall, trauma. EXAM: LEFT ELBOW - COMPLETE 3+ VIEW COMPARISON:  None. FINDINGS: There is no evidence of fracture, dislocation, or joint effusion. There is no evidence of arthropathy or other focal bone abnormality. Mild soft tissue swelling. IMPRESSION: Negative for fracture or effusion. Electronically Signed   By: Staci Righter M.D.   On: 05/24/2017 12:35   Dg Forearm Left  Result Date: 05/24/2017 CLINICAL DATA:  Left forearm pain after tripping and falling today. EXAM: LEFT  FOREARM - 2 VIEW COMPARISON:  Left elbow radiographs obtained at the same time. FINDINGS: Diffuse osteopenia. No fracture or dislocation seen. Mild  distal radioulnar joint degenerative spur formation and mild shortening of the distal ulna relative to the distal radius. Subcutaneous soft tissue swelling in the medial aspect of the proximal forearm. IMPRESSION: 1. No fracture or dislocation. 2. Mild distal radioulnar joint degenerative changes and mild negative ulnar variance. Electronically Signed   By: Claudie Revering M.D.   On: 05/24/2017 12:38    Procedures Procedures (including critical care time)  Medications Ordered in ED Medications  acetaminophen (TYLENOL) tablet 1,000 mg (not administered)     Initial Impression / Assessment and Plan / ED Course  I have reviewed the triage vital signs and the nursing notes.  Pertinent labs & imaging results that were available during my care of the patient were reviewed by me and considered in my medical decision making (see chart for details).  Clinical Course as of May 24 1314  Fri May 24, 2017  1315 The patient was reexamined, she appears well, she is not short of breath, she does not fact have 3 left-sided rib fractures.  I have personally viewed the chest x-ray with ribs and agree with this finding.  I have interpreted this to show that there is no pneumothorax, there is no significant increase in effusions, there is no bleeding or hemothorax evident either clinically or on radiographic exam.  The patient was informed of all of these results, given an incentive spirometer for home and encouraged to use Tylenol for pain, she reports she can take this safely, tramadol for severe or worsening pain, patient has an intolerance to opiates otherwise  [BM]    Clinical Course User Index [BM] Noemi Chapel, MD    No neck tenderness, normal neurologic exam, patient is stable appearing but will need x-rays of the ribs and the forearm.  Patient is agreeable.  Check INR.  Final Clinical Impressions(s) / ED Diagnoses   Final diagnoses:  Closed fracture of multiple ribs of left side, initial encounter   Contusion of left forearm, initial encounter    ED Discharge Orders        Ordered    traMADol (ULTRAM) 50 MG tablet  Every 6 hours PRN     05/24/17 1316       Noemi Chapel, MD 05/24/17 1317

## 2017-05-28 ENCOUNTER — Ambulatory Visit (INDEPENDENT_AMBULATORY_CARE_PROVIDER_SITE_OTHER): Payer: Medicare Other | Admitting: *Deleted

## 2017-05-28 DIAGNOSIS — I4819 Other persistent atrial fibrillation: Secondary | ICD-10-CM

## 2017-05-28 DIAGNOSIS — Z9889 Other specified postprocedural states: Secondary | ICD-10-CM | POA: Diagnosis not present

## 2017-05-28 DIAGNOSIS — I481 Persistent atrial fibrillation: Secondary | ICD-10-CM

## 2017-05-28 DIAGNOSIS — Z5181 Encounter for therapeutic drug level monitoring: Secondary | ICD-10-CM | POA: Diagnosis not present

## 2017-05-28 LAB — POCT INR: INR: 6.6

## 2017-05-28 NOTE — Patient Instructions (Signed)
Hold coumadin tonight and tomorrow night and recheck on Thursday. Started amiodarone 03/27/17   Decreased dose to 200mg  daily on 04/11/17 Bleeding and fall precautions discussed with pt and she verbalized understanding.

## 2017-05-29 DIAGNOSIS — M159 Polyosteoarthritis, unspecified: Secondary | ICD-10-CM | POA: Diagnosis not present

## 2017-05-29 DIAGNOSIS — I4891 Unspecified atrial fibrillation: Secondary | ICD-10-CM | POA: Diagnosis not present

## 2017-05-29 DIAGNOSIS — E7800 Pure hypercholesterolemia, unspecified: Secondary | ICD-10-CM | POA: Diagnosis not present

## 2017-05-29 DIAGNOSIS — J449 Chronic obstructive pulmonary disease, unspecified: Secondary | ICD-10-CM | POA: Diagnosis not present

## 2017-05-30 ENCOUNTER — Ambulatory Visit (INDEPENDENT_AMBULATORY_CARE_PROVIDER_SITE_OTHER): Payer: Medicare Other | Admitting: *Deleted

## 2017-05-30 DIAGNOSIS — Z9889 Other specified postprocedural states: Secondary | ICD-10-CM | POA: Diagnosis not present

## 2017-05-30 DIAGNOSIS — I481 Persistent atrial fibrillation: Secondary | ICD-10-CM | POA: Diagnosis not present

## 2017-05-30 DIAGNOSIS — I4819 Other persistent atrial fibrillation: Secondary | ICD-10-CM

## 2017-05-30 DIAGNOSIS — Z5181 Encounter for therapeutic drug level monitoring: Secondary | ICD-10-CM

## 2017-05-30 LAB — POCT INR: INR: 4.9

## 2017-05-30 NOTE — Patient Instructions (Signed)
Hold coumadin tonight and tomorrow night then decrease coumadin to 1 1/2 tablets until INR check on 2/14. Bleeding and fall precautions discussed with pt and she verbalized understanding.

## 2017-06-03 DIAGNOSIS — S2239XA Fracture of one rib, unspecified side, initial encounter for closed fracture: Secondary | ICD-10-CM | POA: Diagnosis not present

## 2017-06-03 DIAGNOSIS — M25512 Pain in left shoulder: Secondary | ICD-10-CM | POA: Diagnosis not present

## 2017-06-03 DIAGNOSIS — S298XXA Other specified injuries of thorax, initial encounter: Secondary | ICD-10-CM | POA: Diagnosis not present

## 2017-06-03 DIAGNOSIS — M7989 Other specified soft tissue disorders: Secondary | ICD-10-CM | POA: Diagnosis not present

## 2017-06-03 DIAGNOSIS — Z299 Encounter for prophylactic measures, unspecified: Secondary | ICD-10-CM | POA: Diagnosis not present

## 2017-06-05 ENCOUNTER — Telehealth: Payer: Self-pay | Admitting: Cardiology

## 2017-06-05 NOTE — Telephone Encounter (Signed)
Patient states that she is on blood thinner and had blood in her underwear this morning. / tg

## 2017-06-05 NOTE — Telephone Encounter (Signed)
Spoke with pt who states that she has spotting on tissue when wiping. Pt noticed more blood in under clothes today. Pt denies blood in stool at this time. She states that the last time she took a coumadin was on 05/29/17. Pt to have INR checked on tomorrow in the [place] office. Informed to be seen in ED if sx worsen. Pt voiced understanding. [name], R notified of conversation.

## 2017-06-06 ENCOUNTER — Ambulatory Visit (INDEPENDENT_AMBULATORY_CARE_PROVIDER_SITE_OTHER): Payer: Medicare Other | Admitting: *Deleted

## 2017-06-06 DIAGNOSIS — I4819 Other persistent atrial fibrillation: Secondary | ICD-10-CM

## 2017-06-06 DIAGNOSIS — Z9889 Other specified postprocedural states: Secondary | ICD-10-CM

## 2017-06-06 DIAGNOSIS — I481 Persistent atrial fibrillation: Secondary | ICD-10-CM | POA: Diagnosis not present

## 2017-06-06 DIAGNOSIS — Z5181 Encounter for therapeutic drug level monitoring: Secondary | ICD-10-CM | POA: Diagnosis not present

## 2017-06-06 LAB — POCT INR: INR: 1.1

## 2017-06-06 NOTE — Patient Instructions (Signed)
Pt did not restart coumadin on 06/01/17 as instructed Restart coumadin 1 1/2 tablets daily Recheck in 2 weeks

## 2017-06-13 DIAGNOSIS — M810 Age-related osteoporosis without current pathological fracture: Secondary | ICD-10-CM | POA: Diagnosis not present

## 2017-06-18 ENCOUNTER — Encounter: Payer: Self-pay | Admitting: Cardiology

## 2017-06-18 ENCOUNTER — Ambulatory Visit (INDEPENDENT_AMBULATORY_CARE_PROVIDER_SITE_OTHER): Payer: Medicare Other | Admitting: Cardiology

## 2017-06-18 ENCOUNTER — Other Ambulatory Visit: Payer: Self-pay

## 2017-06-18 VITALS — BP 134/84 | HR 77 | Ht 62.0 in | Wt 148.0 lb

## 2017-06-18 DIAGNOSIS — I5033 Acute on chronic diastolic (congestive) heart failure: Secondary | ICD-10-CM

## 2017-06-18 DIAGNOSIS — I481 Persistent atrial fibrillation: Secondary | ICD-10-CM

## 2017-06-18 DIAGNOSIS — I4819 Other persistent atrial fibrillation: Secondary | ICD-10-CM

## 2017-06-18 DIAGNOSIS — I4892 Unspecified atrial flutter: Secondary | ICD-10-CM

## 2017-06-18 DIAGNOSIS — Z9889 Other specified postprocedural states: Secondary | ICD-10-CM

## 2017-06-18 MED ORDER — FUROSEMIDE 40 MG PO TABS: ORAL_TABLET | ORAL | 1 refills | Status: DC

## 2017-06-18 NOTE — Patient Instructions (Signed)
Your physician recommends that you schedule a follow-up appointment in: [place] has recommended you make the following change in your medication:   TAKE LASIX 60 MG (1 & 1/2 TABLETS) TWICE DAILY FOR 2 DAYS THEN RESUME 40 MG (1 TABLET) TWICE DAILY  PLEASE UPDATE [name] Friday ON YOUR SWELLING  Thank you for choosing [facility]!!

## 2017-06-18 NOTE — Progress Notes (Signed)
Clinical Summary [name] [name] is a 77 y.o.female seen today for follow up of the following medical problems.     1. Mitral regurgitation - echo at last [facility] Jan 2017 showed normal LVEF, 3+(modarte)eccentric MR with immobile posterior leaflet.  - repeat echo 01/2016 moderate to severe MR. LVEF 60-65%. LVIDs 31. Symptoms unchanged since last visit.  - 01/2016 TEE moderate to severe eccentric MR, severe TR.  - she is s/p mitral valve repair 07/05/16, Sorin Memo 3D Ring Annuloplasty (size 32mm, catalog #[id number], serial [id number]) - repeat echo 10/2016 with LVEF 60-65%, no WMAs, normal MV repair and ring, moderate TR.  - goal weight 145-150 - she has completed cardiac rehab  - no recent LE edema, SOB, DOE.  2.Paroxysmal afib/ Aflutter - s/p MAZE procedure during recent MV repair -no recurrent afib but recent issues with atypical aflutter. Followed by EP, recs are for cardioversion if INR at goal - has been on amiodarone.   - some recent blood on toilet patper - INRs labile recently. Up to 4.9 05/30/17, last check 06/06/17 1.1 - occasional palpitations - recent issues with tachycardia. Seen by PE, being managed for atypical aflutter. Plans for DCCV however patient with recent subtherapeutic INR.    3. GI bleed - followed by GI - notes indicate hemorroidal bleeding  3. COPD - followed by Dr Luan Pulling   4. HTN -she remains compliant with meds   5. Fall - recent mechanical  fall, CXR with multiple rib fractures.  Past Medical History:  Diagnosis Date  . Anxiety   . Arthritis   . Asthma   . Atrial fibrillation, persistent ([place])   . Chronic diastolic congestive heart failure ([place])   . Colon adenoma   . Colon cancer ([place])    status post low anterior resection, limited stage disease requiring no adjuvant therapy  . COPD (chronic obstructive pulmonary disease) ([place])   . Depression   . Diverticulosis   . DM (dermatomyositis)   . DVT (deep venous thrombosis) ([name])     in leg- long time ago  . Dyspnea    with activity  . Dysrhythmia   . Esophageal dysphagia   . GERD (gastroesophageal reflux disease)   . Headache   . Heart murmur   . Hematuria   . Hemorrhoids   . Hiatal hernia   . History of kidney stones    x 2  . Hypercholesterolemia   . Incidental pulmonary nodule 05/08/2016   8 mm vague opacity RML noted on CT scan  . Mitral regurgitation   . PONV (postoperative nausea and vomiting) 2003 ish    with breast biopsy  . S/P minimally invasive maze operation for atrial fibrillation 07/05/2016   Complete bilateral atrial lesion set using cryothermy and bipolar radiofrequency ablation with clipping of LA appendage via right mini thoracotomy approach  . 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 Schatzki's ring   . Tricuspid regurgitation      Allergies  Allergen Reactions  . Iohexol Hives and Shortness Of Breath     Pt. had a severe allergic reaction to IV contrast the last time she was injected and had to be seen in the ER.   Marland Kitchen Hydrocodone-Acetaminophen Hives  . Nabumetone Rash  . Prednisone Rash     Current Outpatient Medications  Medication Sig Dispense Refill  . albuterol (  PROVENTIL HFA;VENTOLIN HFA) 108 (90 BASE) MCG/ACT inhaler Inhale 2 puffs into the lungs every 6 (six) hours as needed for wheezing.    Marland Kitchen ALPRAZolam (XANAX) 0.5 MG tablet Take 0.5 mg by mouth 2 (two) times daily as needed (for anxiety/sleep (scheduled at bedtime)).     Marland Kitchen amiodarone (PACERONE) 200 MG tablet Take 1 tablet (200 mg total) by mouth daily. 90 tablet 3  . aspirin EC 81 MG EC tablet Take 1 tablet (81 mg total) by mouth daily.    . calcium carbonate (OS-CAL) 600 MG TABS tablet Take 600 mg by mouth 2 (two) times daily with a meal.    . Coenzyme Q10 100 MG TABS Take 100 mg by mouth every evening.      . diltiazem (CARDIZEM) 30 MG tablet Take 1 tablet (30 mg total) by mouth 2 (two) times daily as needed (palpitations). 60 tablet 6  . docusate sodium (COLACE) 100 MG capsule Take 200 mg by mouth daily as needed for mild constipation.     . furosemide (LASIX) 40 MG tablet Take 1 tablet (40 mg total) by mouth 2 (two) times daily. 30 tablet 1  . hydrocortisone (ANUSOL-HC) 2.5 % rectal cream Place 1 application rectally 2 (two) times daily. FOR UP TO 10 DAYS AT A TIME (Patient taking differently: Place 1 application rectally 2 (two) times daily as needed for hemorrhoids or itching. FOR UP TO 10 DAYS AT A TIME) 30 g 1  . metoprolol succinate (TOPROL-XL) 100 MG 24 hr tablet Take 1 tablet (100 mg total) by mouth 2 (two) times daily. Take with or immediately following a meal. 180 tablet 3  . Multiple Vitamin (MULTIVITAMIN) tablet Take 1 tablet by mouth daily.      Marland Kitchen omeprazole (PRILOSEC) 20 MG capsule Take 1 capsule (20 mg total) by mouth daily. 30 capsule 3  . PARoxetine (PAXIL) 20 MG tablet Take 20 mg by mouth daily.     Vladimir Faster Glycol-Propyl Glycol (SYSTANE ULTRA) 0.4-0.3 % SOLN Place 1 drop into both eyes 3 (three) times daily.    . potassium chloride SA (K-DUR,KLOR-CON) 20 MEQ tablet Take 1 tablet (20 mEq total) by mouth 2 (two) times daily. 180 tablet 3  . simvastatin (ZOCOR) 40 MG tablet Take 40 mg daily by mouth.    . sodium chloride (OCEAN) 0.65 % SOLN nasal spray Place 1 spray into both nostrils as needed for congestion.    . traMADol (ULTRAM) 50 MG tablet Take 1 tablet (50 mg total) by mouth every 6 (six) hours as needed. 15 tablet 0  . umeclidinium-vilanterol (ANORO ELLIPTA) 62.5-25 MCG/INH AEPB Inhale 1 puff into the lungs daily. 30 each 6  . warfarin (COUMADIN) 2.5 MG tablet Take 2 tablets daily except 1 1/2 tablets on Mondays, Wednesdays and Fridays (Patient taking differently: Take 2 tablets Saturday and Tuesday; 1 1/2 tablets all other days) 60 tablet 3   No current  facility-administered medications for this visit.      Past Surgical History:  Procedure Laterality Date  . APPENDECTOMY    . BREAST SURGERY Right 2003ish   biopsy  . CARDIAC CATHETERIZATION N/A 04/12/2016   Procedure: Right/Left Heart Cath and Coronary Angiography;  Surgeon: Leonie Man, MD;  Location: [facility];  Service: Cardiovascular;  Laterality: N/A;  . CATARACT EXTRACTION Bilateral 2017  . CLIPPING OF ATRIAL APPENDAGE  07/05/2016   Procedure: CLIPPING OF ATRIAL APPENDAGE;  Surgeon: Rexene Alberts, MD;  Location: [facility];  Service: Open Heart  Surgery;;  . COLONOSCOPY  11/09   Dr. Gala Romney- external hemorrhoidal tags o/w normal rectal mucosa, s/p surgical resection with a normal appearing anastomosis 12cm, pan colonic diverticulum  . COLONOSCOPY N/A 06/02/2012   CBJ:SEGBTD post low anterior resection. Pancolonic diverticulosis. Colonic polyp-tubular adenoma. Surveillance due 2019.   Marland Kitchen COLONOSCOPY N/A 10/13/2015   Procedure: COLONOSCOPY;  Surgeon: Daneil Dolin, MD;  [name];  Service: Endoscopy;  Laterality: N/A;  0930  . ESOPHAGOGASTRODUODENOSCOPY  07/2007   Dr. Daiva Nakayama cervical esophageal web, schatzki ring, large hiatal hernia  . INGUNAL HERNIA REPAIR Left   . LOW ANTERIOR BOWEL RESECTION     NO ADJ CHEMO  . MINIMALLY INVASIVE MAZE PROCEDURE N/A 07/05/2016   Procedure: MINIMALLY INVASIVE MAZE PROCEDURE;  Surgeon: Rexene Alberts, MD;  Location: [facility];  Service: Open Heart Surgery;  Laterality: N/A;  . MITRAL VALVE REPAIR Right 07/05/2016   Procedure: MINIMALLY INVASIVE MITRAL VALVE REPAIR (MVR);  Surgeon: Rexene Alberts, MD;  Location: [facility];  Service: Open Heart Surgery;  Laterality: Right;  . MULTIPLE EXTRACTIONS WITH ALVEOLOPLASTY N/A 05/21/2016   Procedure: MULTIPLE EXTRACTION OF TOOTH #'S 5, 21 WITH ALVEOLOPLASTY AND GROSS DEBRIDEMENT OF TEETH;  Surgeon: Lenn Cal, DDS;  Location: [facility];  Service: Oral Surgery;  Laterality: N/A;  . TEE  WITHOUT CARDIOVERSION N/A 02/20/2016   Procedure: TRANSESOPHAGEAL ECHOCARDIOGRAM (TEE);  Surgeon: Arnoldo Lenis, MD;  [name];  Service: Endoscopy;  Laterality: N/A;  . TEE WITHOUT CARDIOVERSION N/A 07/05/2016   Procedure: TRANSESOPHAGEAL ECHOCARDIOGRAM (TEE);  Surgeon: Rexene Alberts, MD;  Location: [facility];  Service: Open Heart Surgery;  Laterality: N/A;  . TOOTH EXTRACTION    . TUBAL LIGATION    . VEIN LIGATION AND STRIPPING       Allergies  Allergen Reactions  . Iohexol Hives and Shortness Of Breath     Pt. had a severe allergic reaction to IV contrast the last time she was injected and had to be seen in the ER.   Marland Kitchen Hydrocodone-Acetaminophen Hives  . Nabumetone Rash  . Prednisone Rash      Family History  Problem Relation Age of Onset  . Breast cancer Mother   . Rheum arthritis Father   . Cancer - Lung Sister   . Brain cancer Sister   . Prostate cancer Brother   . Aneurysm Brother   . Colon cancer Neg Hx      Social History Ms. Silveri reports that  has never smoked. she has never used smokeless tobacco. Ms. Seelye reports that she does not drink alcohol.   Review of Systems CONSTITUTIONAL: No weight loss, fever, chills, weakness or fatigue.  HEENT: Eyes: No visual loss, blurred vision, double vision or yellow sclerae.No hearing loss, sneezing, congestion, runny nose or sore throat.  SKIN: No rash or itching.  CARDIOVASCULAR: per hpi RESPIRATORY: No shortness of breath, cough or sputum.  GASTROINTESTINAL: No anorexia, nausea, vomiting or diarrhea. No abdominal pain or blood.  GENITOURINARY: No burning on urination, no polyuria NEUROLOGICAL: No headache, dizziness, syncope, paralysis, ataxia, numbness or tingling in the extremities. No change in bowel or bladder control.  MUSCULOSKELETAL: No muscle, back pain, joint pain or stiffness.  LYMPHATICS: No enlarged nodes. No history of splenectomy.  PSYCHIATRIC: No history of depression or anxiety.   ENDOCRINOLOGIC: No reports of sweating, cold or heat intolerance. No polyuria or polydipsia.  Marland Kitchen   Physical Examination Vitals:   06/18/17 1534  BP: 134/84  Pulse:  77  SpO2: 98%   Vitals:   06/18/17 1534  Weight: 148 lb (67.1 kg)  Height: 5\' 2"  (1.575 m)    Gen: resting comfortably, no acute distress HEENT: no scleral icterus, pupils equal round and reactive, no palptable cervical adenopathy,  CV: RRR, no m/r/, no jgvd Resp: Clear to auscultation bilaterally GI: abdomen is soft, non-tender, non-distended, normal bowel sounds, no hepatosplenomegaly MSK: extremities are warm,1+ bilateral LE edema Skin: warm, no rash Neuro:  no focal deficits Psych: appropriate affect   Diagnostic Studies 01/2016 echo Study Conclusions  - Left ventricle: The cavity size was normal. Wall thickness was normal. Systolic function was normal. The estimated ejection fraction was in the range of 60% to 65%. Wall motion was normal; there were no regional wall motion abnormalities. The study is not technically sufficient to allow evaluation of LV diastolic function. Doppler parameters are consistent with high ventricular filling pressure. - Mitral valve: Calcified annulus. There was moderate to severe (closer to severe) eccentric regurgitation. - Left atrium: The atrium was severely dilated. - Right atrium: The atrium was moderately dilated. - Tricuspid valve: There was moderate-severe regurgitation. - Pulmonary arteries: PA peak pressure: 40 mm Hg (S).   01/2016 TEE eccentric moderate to severe MR. Moderate to severe TR. F/u full report for final findings.  08/22/16 echo  Study Conclusions  - Left ventricle: The cavity size was normal. Wall thickness was normal. Systolic function was normal. The estimated ejection fraction was in the range of 60% to 65%. Wall motion was normal; there were no regional wall motion abnormalities. The study is not technically  sufficient to allow evaluation of LV diastolic function. - Aortic valve: Mildly calcified annulus. Trileaflet. Valve area (VTI): 1.6 cm^2. Valve area (Vmax): 1.59 cm^2. Valve area (Vmean): 1.61 cm^2. - Mitral valve: Status post complex valvuloplasty including artificial Gore-tex neochord placement and Sorin Memo 3D Ring Annuloplasty (size 58mm, catalog #[id number], serial [id number]). Mildly thickened leaflets. There was trivial regurgitation. Mean gradient (D): 4 mm Hg. - Left atrium: The atrium was mildly to moderately dilated. - Right ventricle: The cavity size was mildly dilated. - Right atrium: The atrium was at the upper limits of normal in size. Central venous pressure (est): 3 mm Hg. - Atrial septum: No defect or patent foramen ovale was identified. - Tricuspid valve: There was moderate regurgitation. - Pulmonary arteries: Systolic pressure was moderately increased. PA peak pressure: 50 mm Hg (S). - Pericardium, extracardiac: There was no pericardial effusion.  Impressions:  - Normal LV wall thickness with LVEF 60-65%. Indeterminate diastolic function. Mild to moderate left atrial enlargement. Status post mitral valve repair and annuloplasty as indicated with mildly thickened leaflets and trivial mitral regurgitation. Mean gradient 4 mmHg. Mild right ventricular enlargement. At least moderate tricuspid regurgitation noted. Moderate pulmonary hypertension with PASP 50 mmHg.   10/2016 echo Study Conclusions  - Left ventricle: The cavity size was normal. Wall thickness was normal. Systolic function was normal. The estimated ejection fraction was in the range of 60% to 65%. Wall motion was normal; there were no regional wall motion abnormalities. The study is not technically sufficient to allow evaluation of LV diastolic function. - Aortic valve: Moderately calcified annulus. Trileaflet. - Mitral valve: Status post complex valvuloplasty  including artificial Gore-tex neochord placement and Sorin Memo 3D Ring Annuloplasty (size 35mm, catalog #[id number], serial [id number]). There was trivial regurgitation. Mean gradient (D): 4 mm Hg. - Left atrium: The atrium was mildly dilated. - Right ventricle: The cavity size was mildly dilated.  Systolic function appears mildly reduced. - Right atrium: The atrium was mildly to moderately dilated. - Tricuspid valve: There was moderate regurgitation. - Pulmonary arteries: PA peak pressure: 43 mm Hg (S).    Assessment and Plan  1. Mitral regurgitation/Mitral valve repair - s/p mitral valve repair - recent increase in LE edema, she will increase lasix to 60mg  bid until Friday, then call and update [name] on symptoms  2. PAF/Aflutter - recent issues with intermittent tachycardia, found to have atypical aflutter - she is on amio and CCB. Plans for DCCV howevver patient with recent subtherapeutic INR, and therefore will postpone. Symptoms are not significant enough at this time to do a TEE/DCCV, ok to wait additional 3 weeks on anticoag.  - ekg today shows NSR  3. COPD - follow with Dr Luan Pulling  4.Acute on hronic diastolic HF - increased volume, increase lasix to 60mg  bid until Friday - perhaps exacerbated by her intermittent tachycardia    F/u 1 month   Arnoldo Lenis, M.D.

## 2017-06-20 ENCOUNTER — Ambulatory Visit (INDEPENDENT_AMBULATORY_CARE_PROVIDER_SITE_OTHER): Payer: Medicare Other | Admitting: *Deleted

## 2017-06-20 DIAGNOSIS — I4819 Other persistent atrial fibrillation: Secondary | ICD-10-CM

## 2017-06-20 DIAGNOSIS — Z9889 Other specified postprocedural states: Secondary | ICD-10-CM

## 2017-06-20 DIAGNOSIS — I481 Persistent atrial fibrillation: Secondary | ICD-10-CM

## 2017-06-20 DIAGNOSIS — Z5181 Encounter for therapeutic drug level monitoring: Secondary | ICD-10-CM | POA: Diagnosis not present

## 2017-06-20 LAB — POCT INR: INR: 4.5

## 2017-06-20 NOTE — Patient Instructions (Signed)
Hold coumadin tonight then decrease dose to 1 1/2 tablets daily except 1 tablet on Mondays, Wednesdays and Fridays Recheck in 10 days

## 2017-06-21 ENCOUNTER — Encounter: Payer: Self-pay | Admitting: Cardiology

## 2017-06-21 ENCOUNTER — Telehealth: Payer: Self-pay

## 2017-06-21 NOTE — Telephone Encounter (Signed)
Patient states swelling has went down in ankles and feet. Patient states she has resumed the lasix 40 mg BID

## 2017-06-26 DIAGNOSIS — M159 Polyosteoarthritis, unspecified: Secondary | ICD-10-CM | POA: Diagnosis not present

## 2017-06-26 DIAGNOSIS — E7800 Pure hypercholesterolemia, unspecified: Secondary | ICD-10-CM | POA: Diagnosis not present

## 2017-06-26 DIAGNOSIS — J449 Chronic obstructive pulmonary disease, unspecified: Secondary | ICD-10-CM | POA: Diagnosis not present

## 2017-06-26 DIAGNOSIS — I4891 Unspecified atrial fibrillation: Secondary | ICD-10-CM | POA: Diagnosis not present

## 2017-06-28 DIAGNOSIS — N183 Chronic kidney disease, stage 3 (moderate): Secondary | ICD-10-CM | POA: Diagnosis not present

## 2017-06-28 DIAGNOSIS — Z299 Encounter for prophylactic measures, unspecified: Secondary | ICD-10-CM | POA: Diagnosis not present

## 2017-06-28 DIAGNOSIS — I4891 Unspecified atrial fibrillation: Secondary | ICD-10-CM | POA: Diagnosis not present

## 2017-06-28 DIAGNOSIS — S2239XA Fracture of one rib, unspecified side, initial encounter for closed fracture: Secondary | ICD-10-CM | POA: Diagnosis not present

## 2017-06-28 DIAGNOSIS — F411 Generalized anxiety disorder: Secondary | ICD-10-CM | POA: Diagnosis not present

## 2017-06-28 DIAGNOSIS — S298XXA Other specified injuries of thorax, initial encounter: Secondary | ICD-10-CM | POA: Diagnosis not present

## 2017-06-28 DIAGNOSIS — J449 Chronic obstructive pulmonary disease, unspecified: Secondary | ICD-10-CM | POA: Diagnosis not present

## 2017-06-28 DIAGNOSIS — Z681 Body mass index (BMI) 19 or less, adult: Secondary | ICD-10-CM | POA: Diagnosis not present

## 2017-07-02 ENCOUNTER — Ambulatory Visit (INDEPENDENT_AMBULATORY_CARE_PROVIDER_SITE_OTHER): Payer: Medicare Other | Admitting: *Deleted

## 2017-07-02 DIAGNOSIS — Z9889 Other specified postprocedural states: Secondary | ICD-10-CM | POA: Diagnosis not present

## 2017-07-02 DIAGNOSIS — I481 Persistent atrial fibrillation: Secondary | ICD-10-CM

## 2017-07-02 DIAGNOSIS — Z5181 Encounter for therapeutic drug level monitoring: Secondary | ICD-10-CM

## 2017-07-02 DIAGNOSIS — I4819 Other persistent atrial fibrillation: Secondary | ICD-10-CM

## 2017-07-02 LAB — POCT INR: INR: 3.7

## 2017-07-02 NOTE — Patient Instructions (Signed)
Decrease coumadin to 1 tablet daily except 1 1/2 tablet on Sundays, Tuesdays and Thursdays Recheck in 1 week Possible DCCV

## 2017-07-08 ENCOUNTER — Ambulatory Visit: Payer: Self-pay | Admitting: Thoracic Surgery (Cardiothoracic Vascular Surgery)

## 2017-07-09 ENCOUNTER — Ambulatory Visit (INDEPENDENT_AMBULATORY_CARE_PROVIDER_SITE_OTHER): Payer: Medicare Other | Admitting: *Deleted

## 2017-07-09 DIAGNOSIS — I481 Persistent atrial fibrillation: Secondary | ICD-10-CM | POA: Diagnosis not present

## 2017-07-09 DIAGNOSIS — Z9889 Other specified postprocedural states: Secondary | ICD-10-CM

## 2017-07-09 DIAGNOSIS — Z5181 Encounter for therapeutic drug level monitoring: Secondary | ICD-10-CM | POA: Diagnosis not present

## 2017-07-09 DIAGNOSIS — I4819 Other persistent atrial fibrillation: Secondary | ICD-10-CM

## 2017-07-09 LAB — POCT INR: INR: 3.2

## 2017-07-09 NOTE — Patient Instructions (Signed)
Continue coumadin 1 tablet daily except 1 1/2 tablet on Sundays, Tuesdays and Thursdays Recheck in 1 week Possible DCCV

## 2017-07-14 ENCOUNTER — Other Ambulatory Visit: Payer: Self-pay | Admitting: Cardiology

## 2017-07-15 ENCOUNTER — Encounter: Payer: Self-pay | Admitting: Cardiology

## 2017-07-15 ENCOUNTER — Ambulatory Visit (INDEPENDENT_AMBULATORY_CARE_PROVIDER_SITE_OTHER): Payer: Medicare Other | Admitting: Cardiology

## 2017-07-15 VITALS — BP 127/84 | HR 79 | Ht 62.0 in | Wt 152.8 lb

## 2017-07-15 DIAGNOSIS — I481 Persistent atrial fibrillation: Secondary | ICD-10-CM

## 2017-07-15 DIAGNOSIS — Z9889 Other specified postprocedural states: Secondary | ICD-10-CM

## 2017-07-15 DIAGNOSIS — I10 Essential (primary) hypertension: Secondary | ICD-10-CM

## 2017-07-15 DIAGNOSIS — I4819 Other persistent atrial fibrillation: Secondary | ICD-10-CM

## 2017-07-15 MED ORDER — FUROSEMIDE 40 MG PO TABS: ORAL_TABLET | ORAL | 1 refills | Status: DC

## 2017-07-15 NOTE — Progress Notes (Signed)
Clinical Summary [name] [name] is a 77 y.o.female seen today for follow up of the following medical problems.     1. Mitral regurgitation - echo at last [facility] Jan 2017 showed normal LVEF, 3+(modarte)eccentric MR with immobile posterior leaflet.  - repeat echo 01/2016 moderate to severe MR. LVEF 60-65%. LVIDs 31. Symptoms unchanged since last visit.  - 01/2016 TEE moderate to severe eccentric MR, severe TR.  - she is s/p mitral valve repair 07/05/16, Sorin Memo 3D Ring Annuloplasty (size 77mm, catalog #[id number], serial [id number]) - repeat echo 10/2016 with LVEF 60-65%, no WMAs, normal MV repair and ring, moderate TR.  - goal weight 145-150 - she has completed cardiac rehab  - she denies any recent symptoms.   2.Paroxysmal afib/ Aflutter - s/p MAZE procedure during recent MV repair -no recurrent afib but recent issues with atypical aflutter. Followed by EP, recs are for cardioversion if INR at goal - has been on amiodarone.   - recent issues with tachycardia. Seen by EP, being managed for atypical aflutter. Plans for DCCV however patient with recent subtherapeutic INR and procedure was postponsed  3/19 INR 3.2 3/12 INR 3.7 2/28 4.5   3. GI bleed - followed by GI - notes indicate hemorroidal bleeding   4. COPD -followed by Dr Luan Pulling   5. HTN -compliant with meds  6. Leg edema - intermittent, improves with extra lasix as needed.   Past Medical History:  Diagnosis Date  . Anxiety   . Arthritis   . Asthma   . Atrial fibrillation, persistent ([place])   . Chronic diastolic congestive heart failure ([place])   . Colon adenoma   . Colon cancer ([place])    status post low anterior resection, limited stage disease requiring no adjuvant therapy  . COPD (chronic obstructive pulmonary disease) ([place])   . Depression   . Diverticulosis   . DM (dermatomyositis)   . DVT (deep venous thrombosis) ([name])    in leg- long time ago  . Dyspnea    with activity  .  Dysrhythmia   . Esophageal dysphagia   . GERD (gastroesophageal reflux disease)   . Headache   . Heart murmur   . Hematuria   . Hemorrhoids   . Hiatal hernia   . History of kidney stones    x 2  . Hypercholesterolemia   . Incidental pulmonary nodule 05/08/2016   8 mm vague opacity RML noted on CT scan  . Mitral regurgitation   . PONV (postoperative nausea and vomiting) 2003 ish    with breast biopsy  . S/P minimally invasive maze operation for atrial fibrillation 07/05/2016   Complete bilateral atrial lesion set using cryothermy and bipolar radiofrequency ablation with clipping of LA appendage via right mini thoracotomy approach  . 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 Schatzki's ring   . Tricuspid regurgitation      Allergies  Allergen Reactions  . Iohexol Hives and Shortness Of Breath     Pt. had a severe allergic reaction to IV contrast the last time she was injected and had to be seen in the ER.   Marland Kitchen Hydrocodone-Acetaminophen Hives  . Nabumetone Rash  . Prednisone Rash     Current Outpatient Medications  Medication Sig Dispense Refill  . albuterol (PROVENTIL HFA;VENTOLIN HFA) 108 (90 BASE) MCG/ACT inhaler Inhale 2 puffs into the  lungs every 6 (six) hours as needed for wheezing.    Marland Kitchen ALPRAZolam (XANAX) 0.5 MG tablet Take 0.5 mg by mouth 2 (two) times daily as needed (for anxiety/sleep (scheduled at bedtime)).     Marland Kitchen amiodarone (PACERONE) 200 MG tablet Take 1 tablet (200 mg total) by mouth daily. 90 tablet 3  . aspirin EC 81 MG EC tablet Take 1 tablet (81 mg total) by mouth daily.    . calcium carbonate (OS-CAL) 600 MG TABS tablet Take 600 mg by mouth 2 (two) times daily with a meal.    . Coenzyme Q10 100 MG TABS Take 100 mg by mouth every evening.     . diltiazem (CARDIZEM) 30 MG tablet Take 1 tablet (30  mg total) by mouth 2 (two) times daily as needed (palpitations). 60 tablet 6  . docusate sodium (COLACE) 100 MG capsule Take 200 mg by mouth daily as needed for mild constipation.     . furosemide (LASIX) 40 MG tablet TAKE 1 & 1/2 TABLETS 2 TIMES DAILY FOR 2 DAYS THEN RESUME 1 TABLET 2 TIMES DAILY 186 tablet 1  . hydrocortisone (ANUSOL-HC) 2.5 % rectal cream Place 1 application rectally 2 (two) times daily. FOR UP TO 10 DAYS AT A TIME (Patient taking differently: Place 1 application rectally 2 (two) times daily as needed for hemorrhoids or itching. FOR UP TO 10 DAYS AT A TIME) 30 g 1  . metoprolol succinate (TOPROL-XL) 100 MG 24 hr tablet Take 100 mg by mouth 2 (two) times daily. Take with or immediately following a meal.    . Multiple Vitamin (MULTIVITAMIN) tablet Take 1 tablet by mouth daily.      Marland Kitchen omeprazole (PRILOSEC) 20 MG capsule Take 1 capsule (20 mg total) by mouth daily. 30 capsule 3  . PARoxetine (PAXIL) 20 MG tablet Take 20 mg by mouth daily.     Vladimir Faster Glycol-Propyl Glycol (SYSTANE ULTRA) 0.4-0.3 % SOLN Place 1 drop into both eyes 3 (three) times daily.    . potassium chloride SA (K-DUR,KLOR-CON) 20 MEQ tablet Take 1 tablet (20 mEq total) by mouth 2 (two) times daily. 180 tablet 3  . simvastatin (ZOCOR) 40 MG tablet Take 40 mg daily by mouth.    . sodium chloride (OCEAN) 0.65 % SOLN nasal spray Place 1 spray into both nostrils as needed for congestion.    . traMADol (ULTRAM) 50 MG tablet Take 1 tablet (50 mg total) by mouth every 6 (six) hours as needed. 15 tablet 0  . umeclidinium-vilanterol (ANORO ELLIPTA) 62.5-25 MCG/INH AEPB Inhale 1 puff into the lungs daily. 30 each 6  . warfarin (COUMADIN) 2.5 MG tablet Take 2 tablets daily except 1 1/2 tablets on Mondays, Wednesdays and Fridays (Patient taking differently: Take 2 tablets Saturday and Tuesday; 1 1/2 tablets all other days) 60 tablet 3   No current facility-administered medications for this visit.      Past Surgical  History:  Procedure Laterality Date  . APPENDECTOMY    . BREAST SURGERY Right 2003ish   biopsy  . CARDIAC CATHETERIZATION N/A 04/12/2016   Procedure: Right/Left Heart Cath and Coronary Angiography;  Surgeon: Leonie Man, MD;  Location: [facility];  Service: Cardiovascular;  Laterality: N/A;  . CATARACT EXTRACTION Bilateral 2017  . CLIPPING OF ATRIAL APPENDAGE  07/05/2016   Procedure: CLIPPING OF ATRIAL APPENDAGE;  Surgeon: Rexene Alberts, MD;  Location: [facility];  Service: Open Heart Surgery;;  . COLONOSCOPY  11/09   Dr. Gala Romney- external  hemorrhoidal tags o/w normal rectal mucosa, s/p surgical resection with a normal appearing anastomosis 12cm, pan colonic diverticulum  . COLONOSCOPY N/A 06/02/2012   [id number] post low anterior resection. Pancolonic diverticulosis. Colonic polyp-tubular adenoma. Surveillance due 2019.   Marland Kitchen COLONOSCOPY N/A 10/13/2015   Procedure: COLONOSCOPY;  Surgeon: Daneil Dolin, MD;  [name];  Service: Endoscopy;  Laterality: N/A;  0930  . ESOPHAGOGASTRODUODENOSCOPY  07/2007   Dr. Daiva Nakayama cervical esophageal web, schatzki ring, large hiatal hernia  . INGUNAL HERNIA REPAIR Left   . LOW ANTERIOR BOWEL RESECTION     NO ADJ CHEMO  . MINIMALLY INVASIVE MAZE PROCEDURE N/A 07/05/2016   Procedure: MINIMALLY INVASIVE MAZE PROCEDURE;  Surgeon: Rexene Alberts, MD;  Location: [facility];  Service: Open Heart Surgery;  Laterality: N/A;  . MITRAL VALVE REPAIR Right 07/05/2016   Procedure: MINIMALLY INVASIVE MITRAL VALVE REPAIR (MVR);  Surgeon: Rexene Alberts, MD;  Location: [facility];  Service: Open Heart Surgery;  Laterality: Right;  . MULTIPLE EXTRACTIONS WITH ALVEOLOPLASTY N/A 05/21/2016   Procedure: MULTIPLE EXTRACTION OF TOOTH #'S 5, 21 WITH ALVEOLOPLASTY AND GROSS DEBRIDEMENT OF TEETH;  Surgeon: Lenn Cal, DDS;  Location: [facility];  Service: Oral Surgery;  Laterality: N/A;  . TEE WITHOUT CARDIOVERSION N/A 02/20/2016   Procedure: TRANSESOPHAGEAL  ECHOCARDIOGRAM (TEE);  Surgeon: Arnoldo Lenis, MD;  [name];  Service: Endoscopy;  Laterality: N/A;  . TEE WITHOUT CARDIOVERSION N/A 07/05/2016   Procedure: TRANSESOPHAGEAL ECHOCARDIOGRAM (TEE);  Surgeon: Rexene Alberts, MD;  Location: [place];  Service: Open Heart Surgery;  Laterality: N/A;  . TOOTH EXTRACTION    . TUBAL LIGATION    . VEIN LIGATION AND STRIPPING       Allergies  Allergen Reactions  . Iohexol Hives and Shortness Of Breath     Pt. had a severe allergic reaction to IV contrast the last time she was injected and had to be seen in the ER.   Marland Kitchen Hydrocodone-Acetaminophen Hives  . Nabumetone Rash  . Prednisone Rash      Family History  Problem Relation Age of Onset  . Breast cancer Mother   . Rheum arthritis Father   . Cancer - Lung Sister   . Brain cancer Sister   . Prostate cancer Brother   . Aneurysm Brother   . Colon cancer Neg Hx      Social History Ms. Calleros reports that she has never smoked. She has never used smokeless tobacco. Ms. Patton reports that she does not drink alcohol.   Review of Systems CONSTITUTIONAL: No weight loss, fever, chills, weakness or fatigue.  HEENT: Eyes: No visual loss, blurred vision, double vision or yellow sclerae.No hearing loss, sneezing, congestion, runny nose or sore throat.  SKIN: No rash or itching.  CARDIOVASCULAR: per hpi RESPIRATORY: No shortness of breath, cough or sputum.  GASTROINTESTINAL: No anorexia, nausea, vomiting or diarrhea. No abdominal pain or blood.  GENITOURINARY: No burning on urination, no polyuria NEUROLOGICAL: No headache, dizziness, syncope, paralysis, ataxia, numbness or tingling in the extremities. No change in bowel or bladder control.  MUSCULOSKELETAL: No muscle, back pain, joint pain or stiffness.  LYMPHATICS: No enlarged nodes. No history of splenectomy.  PSYCHIATRIC: No history of depression or anxiety.  ENDOCRINOLOGIC: No reports of sweating, cold or heat  intolerance. No polyuria or polydipsia.  Marland Kitchen   Physical Examination Vitals:   07/15/17 1346  BP: 127/84  Pulse: 79  SpO2: 95%   Vitals:   07/15/17 1346  Weight: 152 lb 12.8 oz (69.3 kg)  Height: 5\' 2"  (1.575 m)    Gen: resting comfortably, no acute distress HEENT: no scleral icterus, pupils equal round and reactive, no palptable cervical adenopathy,  CV: irreg, no m/r/g, no jvd Resp: Clear to auscultation bilaterally GI: abdomen is soft, non-tender, non-distended, normal bowel sounds, no hepatosplenomegaly MSK: extremities are warm, no edema.  Skin: warm, no rash Neuro:  no focal deficits Psych: appropriate affect   Diagnostic Studies 01/2016 echo Study Conclusions  - Left ventricle: The cavity size was normal. Wall thickness was normal. Systolic function was normal. The estimated ejection fraction was in the range of 60% to 65%. Wall motion was normal; there were no regional wall motion abnormalities. The study is not technically sufficient to allow evaluation of LV diastolic function. Doppler parameters are consistent with high ventricular filling pressure. - Mitral valve: Calcified annulus. There was moderate to severe (closer to severe) eccentric regurgitation. - Left atrium: The atrium was severely dilated. - Right atrium: The atrium was moderately dilated. - Tricuspid valve: There was moderate-severe regurgitation. - Pulmonary arteries: PA peak pressure: 40 mm Hg (S).   01/2016 TEE eccentric moderate to severe MR. Moderate to severe TR. F/u full report for final findings.  08/22/16 echo  Study Conclusions  - Left ventricle: The cavity size was normal. Wall thickness was normal. Systolic function was normal. The estimated ejection fraction was in the range of 60% to 65%. Wall motion was normal; there were no regional wall motion abnormalities. The study is not technically sufficient to allow evaluation of LV  diastolic function. - Aortic valve: Mildly calcified annulus. Trileaflet. Valve area (VTI): 1.6 cm^2. Valve area (Vmax): 1.59 cm^2. Valve area (Vmean): 1.61 cm^2. - Mitral valve: Status post complex valvuloplasty including artificial Gore-tex neochord placement and Sorin Memo 3D Ring Annuloplasty (size 29mm, catalog #[id number], serial [id number]). Mildly thickened leaflets. There was trivial regurgitation. Mean gradient (D): 4 mm Hg. - Left atrium: The atrium was mildly to moderately dilated. - Right ventricle: The cavity size was mildly dilated. - Right atrium: The atrium was at the upper limits of normal in size. Central venous pressure (est): 3 mm Hg. - Atrial septum: No defect or patent foramen ovale was identified. - Tricuspid valve: There was moderate regurgitation. - Pulmonary arteries: Systolic pressure was moderately increased. PA peak pressure: 50 mm Hg (S). - Pericardium, extracardiac: There was no pericardial effusion.  Impressions:  - Normal LV wall thickness with LVEF 60-65%. Indeterminate diastolic function. Mild to moderate left atrial enlargement. Status post mitral valve repair and annuloplasty as indicated with mildly thickened leaflets and trivial mitral regurgitation. Mean gradient 4 mmHg. Mild right ventricular enlargement. At least moderate tricuspid regurgitation noted. Moderate pulmonary hypertension with PASP 50 mmHg.   10/2016 echo Study Conclusions  - Left ventricle: The cavity size was normal. Wall thickness was normal. Systolic function was normal. The estimated ejection fraction was in the range of 60% to 65%. Wall motion was normal; there were no regional wall motion abnormalities. The study is not technically sufficient to allow evaluation of LV diastolic function. - Aortic valve: Moderately calcified annulus. Trileaflet. - Mitral valve: Status post complex valvuloplasty including artificial Gore-tex  neochord placement and Sorin Memo 3D Ring Annuloplasty (size 20mm, catalog #[id number], serial [id number]). There was trivial regurgitation. Mean gradient (D): 4 mm Hg. - Left atrium: The atrium was mildly dilated. - Right ventricle: The cavity size was mildly dilated. Systolic function appears mildly reduced. - Right atrium: The atrium  was mildly to moderately dilated. - Tricuspid valve: There was moderate regurgitation. - Pulmonary arteries: PA peak pressure: 43 mm Hg (S).      Assessment and Plan   1. Mitral regurgitation/Mitral valve repair - s/p mitral valve repair - no recent symptoms, continue current meds  2. AFib/Aflutter - recent issues with intermittent tachycardia, found to have atypical aflutter - she is on amio and CCB. Plans for DCCV were postponed due to subtherapeutic INRs, most recent INRs have been at goal - plan for DCCV - EKG today shows afib.    3. COPD - follow with Dr Luan Pulling  4. HTN - at goal, continue current meds    Arnoldo Lenis, M.D.

## 2017-07-15 NOTE — Patient Instructions (Addendum)
Your physician wants you to follow-up in:  2 weeks after cardioversion, call our office when you are ready to schedule   Take Lasix 60 mg (1 1/2 tablets ) twice a day for 2 days, and then go back to 40 mg twice a day   You may take 60 mg twice a day when you have gained greater than 3 lbs over night.      Call us when you are ready to schedule your cardioversion        Thank you for choosing [facility] !

## 2017-07-16 ENCOUNTER — Ambulatory Visit (INDEPENDENT_AMBULATORY_CARE_PROVIDER_SITE_OTHER): Payer: Medicare Other | Admitting: *Deleted

## 2017-07-16 DIAGNOSIS — I4819 Other persistent atrial fibrillation: Secondary | ICD-10-CM

## 2017-07-16 DIAGNOSIS — I481 Persistent atrial fibrillation: Secondary | ICD-10-CM

## 2017-07-16 DIAGNOSIS — Z5181 Encounter for therapeutic drug level monitoring: Secondary | ICD-10-CM | POA: Diagnosis not present

## 2017-07-16 DIAGNOSIS — Z9889 Other specified postprocedural states: Secondary | ICD-10-CM

## 2017-07-16 LAB — POCT INR: INR: 4.2

## 2017-07-16 NOTE — Patient Instructions (Signed)
Hold coumadin tonight then decrease dose to 1 tablet daily except 1 1/2 tablet on Sundays and Thursdays Recheck in 1 week Possible DCCV

## 2017-07-17 ENCOUNTER — Other Ambulatory Visit: Payer: Self-pay | Admitting: *Deleted

## 2017-07-17 MED ORDER — WARFARIN SODIUM 2.5 MG PO TABS: ORAL_TABLET | ORAL | 3 refills | Status: DC

## 2017-07-18 ENCOUNTER — Encounter: Payer: Self-pay | Admitting: Nurse Practitioner

## 2017-07-18 ENCOUNTER — Ambulatory Visit (INDEPENDENT_AMBULATORY_CARE_PROVIDER_SITE_OTHER): Payer: Medicare Other | Admitting: Nurse Practitioner

## 2017-07-18 VITALS — BP 106/69 | HR 85 | Temp 97.7°F | Ht 62.0 in | Wt 135.4 lb

## 2017-07-18 DIAGNOSIS — R634 Abnormal weight loss: Secondary | ICD-10-CM | POA: Diagnosis not present

## 2017-07-18 DIAGNOSIS — K625 Hemorrhage of anus and rectum: Secondary | ICD-10-CM | POA: Diagnosis not present

## 2017-07-18 DIAGNOSIS — K649 Unspecified hemorrhoids: Secondary | ICD-10-CM

## 2017-07-18 NOTE — Assessment & Plan Note (Signed)
The patient has lost some weight recently of about 12 pounds in the past couple months.  She did have surgery recently.  She also had a 2-week stomach bug a few weeks ago.  This is now resolved.  Her appetite was not great during this.  It still remains not great.  I recommended a supplement once a day such as Ensure, boost, [employer].  If she skips a meal during the day she should add an additional supplement that day.  Follow-up in 3 months to track weight.

## 2017-07-18 NOTE — Patient Instructions (Signed)
1. Continue doing her current treatments for your hemorrhoids including rectal cream and warm water baths. 2. Avoid getting constipated.  If you need help with this call our office. 3. Start taking a supplement at least once a day.  You can try Ensure, boost, or Carnation instant breakfast.  If you skip a meal, take an additional supplement to replace your missed meal that day. 4. Follow-up in 3 months. 5. Call us if you have any questions or concerns.   It was great seeing you today!!   At [facility] we value your feedback. You may receive a survey about your visit today. Please share your experience as we strive to create trusing relationships with our patients to provide genuine, compassionate, quality care.

## 2017-07-18 NOTE — Assessment & Plan Note (Signed)
Intermittent scant toilet tissue hematochezia related to hemorrhoids.  Colonoscopy is up-to-date.  Continue current hemorrhoid treatment as per above.  Avoid constipation.  Follow-up in 3 months.

## 2017-07-18 NOTE — Assessment & Plan Note (Signed)
The patient has grade 3-4 hemorrhoids per colonoscopy.  Rectal cream is not very effective.  Treatment that works best are warm water baths/sitz baths.  Not a very good candidate for hemorrhoid banding due to chronic anticoagulation on warfarin for atrial fibrillation.  Query surgical candidacy.  At this time she is wanting to proceed with current hemorrhoid treatments.  Follow-up in 3 months.

## 2017-07-18 NOTE — Progress Notes (Signed)
Referring Provider: Glenda Chroman, MD Primary Care Physician:  Glenda Chroman, MD Primary GI:  Dr. Gala Romney  Chief Complaint  Patient presents with  . Constipation  . Rectal Bleeding  . Weight Loss    lost 12 lbs since OV 04/2017    HPI:   [name] [name] is a 77 y.o. female who presents for follow-up on constipation.  The patient was last seen in our office 05/20/2017 for constipation and rectal bleeding, hemorrhoids.  Colonoscopy up-to-date 2017 recommended repeat in 2020 if health permits due to semi-pedunculated 5 mm sessile polyp found to be serrated polyp/adenoma without cytologic dysplasia.  Recent minimally invasive mitral valve repair which went well.  The last office visit she was doing okay overall.  Constipation doing well with 1-3 bowel movements a day consistent with [place] 4.  Some rectal bleeding from hemorrhoids about once every week.  Rectal cream helps some.  Noted hemorrhoid symptoms including rectal stinging/burning.  Bleeding only with bowel movement.  No other GI symptoms.  Recommended apothecary cream compounded with hydrocortisone and lidocaine.  Follow-up in 2 months.  Today she states she's doing ok overall. Apothecary cream didn't help much. Prefers sitz baths/warm water. Still with hemorrhoid bleeding about once a week, mostly on toilet tissue. Has had some weight loss recently (objectively confirmed). Appetite is so-so and states "I don't care if I eat or not." Feels she's eating less. Denies abdominal pain. She did have some nausea a couple weeks ago which lasted about 2 weeks which she feels affected her intake. Nausea has resolved at this time. Denies chest pain, dyspnea, dizziness, lightheadedness, syncope, near syncope. Denies any other upper or lower GI symptoms.  Noted grade 3/4 hemorrhoids on last colonoscopy, no mention to internal vs external. She is anticoagulated (Warfarin) for AFib.  Past Medical History:  Diagnosis Date  . Anxiety   . Arthritis     . Asthma   . Atrial fibrillation, persistent ([place])   . Chronic diastolic congestive heart failure ([place])   . Colon adenoma   . Colon cancer ([place])    status post low anterior resection, limited stage disease requiring no adjuvant therapy  . COPD (chronic obstructive pulmonary disease) ([place])   . Depression   . Diverticulosis   . DM (dermatomyositis)   . DVT (deep venous thrombosis) ([name])    in leg- long time ago  . Dyspnea    with activity  . Dysrhythmia   . Esophageal dysphagia   . GERD (gastroesophageal reflux disease)   . Headache   . Heart murmur   . Hematuria   . Hemorrhoids   . Hiatal hernia   . History of kidney stones    x 2  . Hypercholesterolemia   . Incidental pulmonary nodule 05/08/2016   8 mm vague opacity RML noted on CT scan  . Mitral regurgitation   . PONV (postoperative nausea and vomiting) 2003 ish    with breast biopsy  . S/P minimally invasive maze operation for atrial fibrillation 07/05/2016   Complete bilateral atrial lesion set using cryothermy and bipolar radiofrequency ablation with clipping of LA appendage via right mini thoracotomy approach  . 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 Schatzki's ring   . Tricuspid regurgitation     Past Surgical History:  Procedure Laterality Date  . APPENDECTOMY    .  BREAST SURGERY Right 2003ish   biopsy  . CARDIAC CATHETERIZATION N/A 04/12/2016   Procedure: Right/Left Heart Cath and Coronary Angiography;  Surgeon: Leonie Man, MD;  Location: [facility];  Service: Cardiovascular;  Laterality: N/A;  . CATARACT EXTRACTION Bilateral 2017  . CLIPPING OF ATRIAL APPENDAGE  07/05/2016   Procedure: CLIPPING OF ATRIAL APPENDAGE;  Surgeon: Rexene Alberts, MD;  Location: [facility];  Service: Open Heart Surgery;;  . COLONOSCOPY  11/09   Dr. Gala Romney-  external hemorrhoidal tags o/w normal rectal mucosa, s/p surgical resection with a normal appearing anastomosis 12cm, pan colonic diverticulum  . COLONOSCOPY N/A 06/02/2012   TFT:DDUKGU post low anterior resection. Pancolonic diverticulosis. Colonic polyp-tubular adenoma. Surveillance due 2019.   Marland Kitchen COLONOSCOPY N/A 10/13/2015   Procedure: COLONOSCOPY;  Surgeon: Daneil Dolin, MD;  [name];  Service: Endoscopy;  Laterality: N/A;  0930  . ESOPHAGOGASTRODUODENOSCOPY  07/2007   Dr. Daiva Nakayama cervical esophageal web, schatzki ring, large hiatal hernia  . INGUNAL HERNIA REPAIR Left   . LOW ANTERIOR BOWEL RESECTION     NO ADJ CHEMO  . MINIMALLY INVASIVE MAZE PROCEDURE N/A 07/05/2016   Procedure: MINIMALLY INVASIVE MAZE PROCEDURE;  Surgeon: Rexene Alberts, MD;  Location: [facility];  Service: Open Heart Surgery;  Laterality: N/A;  . MITRAL VALVE REPAIR Right 07/05/2016   Procedure: MINIMALLY INVASIVE MITRAL VALVE REPAIR (MVR);  Surgeon: Rexene Alberts, MD;  Location: [facility];  Service: Open Heart Surgery;  Laterality: Right;  . MULTIPLE EXTRACTIONS WITH ALVEOLOPLASTY N/A 05/21/2016   Procedure: MULTIPLE EXTRACTION OF TOOTH #'S 5, 21 WITH ALVEOLOPLASTY AND GROSS DEBRIDEMENT OF TEETH;  Surgeon: Lenn Cal, DDS;  Location: [facility];  Service: Oral Surgery;  Laterality: N/A;  . TEE WITHOUT CARDIOVERSION N/A 02/20/2016   Procedure: TRANSESOPHAGEAL ECHOCARDIOGRAM (TEE);  Surgeon: Arnoldo Lenis, MD;  [name];  Service: Endoscopy;  Laterality: N/A;  . TEE WITHOUT CARDIOVERSION N/A 07/05/2016   Procedure: TRANSESOPHAGEAL ECHOCARDIOGRAM (TEE);  Surgeon: Rexene Alberts, MD;  Location: [facility];  Service: Open Heart Surgery;  Laterality: N/A;  . TOOTH EXTRACTION    . TUBAL LIGATION    . VEIN LIGATION AND STRIPPING      Current Outpatient Medications  Medication Sig Dispense Refill  . albuterol (PROVENTIL HFA;VENTOLIN HFA) 108 (90 BASE) MCG/ACT inhaler Inhale 2 puffs into the  lungs every 6 (six) hours as needed for wheezing.    Marland Kitchen ALPRAZolam (XANAX) 0.5 MG tablet Take 0.5 mg by mouth 2 (two) times daily as needed (for anxiety/sleep (scheduled at bedtime)).     Marland Kitchen amiodarone (PACERONE) 200 MG tablet Take 1 tablet (200 mg total) by mouth daily. 90 tablet 3  . aspirin EC 81 MG EC tablet Take 1 tablet (81 mg total) by mouth daily.    . calcium carbonate (OS-CAL) 600 MG TABS tablet Take 600 mg by mouth 2 (two) times daily with a meal.    . Coenzyme Q10 100 MG TABS Take 100 mg by mouth every evening.     . diltiazem (CARDIZEM) 30 MG tablet Take 1 tablet (30 mg total) by mouth 2 (two) times daily as needed (palpitations). 60 tablet 6  . docusate sodium (COLACE) 100 MG capsule Take 200 mg by mouth daily as needed for mild constipation.     . furosemide (LASIX) 40 MG tablet Take 40 mg twice a day , may take 60 mg twice a day for fluid gain 186 tablet 1  .  hydrocortisone (ANUSOL-HC) 2.5 % rectal cream Place 1 application rectally 2 (two) times daily. FOR UP TO 10 DAYS AT A TIME (Patient taking differently: Place 1 application rectally 2 (two) times daily as needed for hemorrhoids or itching. FOR UP TO 10 DAYS AT A TIME) 30 g 1  . metoprolol succinate (TOPROL-XL) 100 MG 24 hr tablet Take 100 mg by mouth 2 (two) times daily. Take with or immediately following a meal.    . Multiple Vitamin (MULTIVITAMIN) tablet Take 1 tablet by mouth daily.      Marland Kitchen omeprazole (PRILOSEC) 20 MG capsule Take 1 capsule (20 mg total) by mouth daily. 30 capsule 3  . PARoxetine (PAXIL) 20 MG tablet Take 20 mg by mouth daily.     Vladimir Faster Glycol-Propyl Glycol (SYSTANE ULTRA) 0.4-0.3 % SOLN Place 1 drop into both eyes 3 (three) times daily.    . potassium chloride SA (K-DUR,KLOR-CON) 20 MEQ tablet Take 1 tablet (20 mEq total) by mouth 2 (two) times daily. 180 tablet 3  . simvastatin (ZOCOR) 40 MG tablet Take 40 mg daily by mouth.    . sodium chloride (OCEAN) 0.65 % SOLN nasal spray Place 1 spray into both  nostrils as needed for congestion.    . traMADol (ULTRAM) 50 MG tablet Take 1 tablet (50 mg total) by mouth every 6 (six) hours as needed. 15 tablet 0  . umeclidinium-vilanterol (ANORO ELLIPTA) 62.5-25 MCG/INH AEPB Inhale 1 puff into the lungs daily. 30 each 6  . warfarin (COUMADIN) 2.5 MG tablet Take 1 tablet daily except 1 1/2 tablets on Sundays and Thursdays or as directed 100 tablet 3   No current facility-administered medications for this visit.     Allergies as of 07/18/2017 - Review Complete 07/18/2017  Allergen Reaction Noted  . Iohexol Hives and Shortness Of Breath 07/16/2008  . Hydrocodone-acetaminophen Hives 04/26/2010  . Nabumetone Rash 04/26/2010  . Prednisone Rash 10/13/2015    Family History  Problem Relation Age of Onset  . Breast cancer Mother   . Rheum arthritis Father   . Cancer - Lung Sister   . Brain cancer Sister   . Prostate cancer Brother   . Aneurysm Brother   . Colon cancer Neg Hx     Social History   Socioeconomic History  . Marital status: Married    Spouse name: Not on file  . Number of children: 2  . Years of education: Not on file  . Highest education level: Not on file  Occupational History  . Not on file  Social Needs  . Financial resource strain: Not on file  . Food insecurity:    Worry: Not on file    Inability: Not on file  . Transportation needs:    Medical: Not on file    Non-medical: Not on file  Tobacco Use  . Smoking status: Never Smoker  . Smokeless tobacco: Never Used  Substance and Sexual Activity  . Alcohol use: No    Alcohol/week: 0.0 oz    Comment: Rarely  . Drug use: No  . Sexual activity: Not on file  Lifestyle  . Physical activity:    Days per week: Not on file    Minutes per session: Not on file  . Stress: Not on file  Relationships  . Social connections:    Talks on phone: Not on file    Gets together: Not on file    Attends religious service: Not on file    Active member of club or  organization: Not  on file    Attends meetings of clubs or organizations: Not on file    Relationship status: Not on file  Other Topics Concern  . Not on file  Social History Narrative  . Not on file    Review of Systems: General: Negative for anorexia, weight loss, fever, chills, fatigue, weakness. ENT: Negative for hoarseness, difficulty swallowing , nasal congestion. CV: Negative for chest pain, angina, palpitations, dyspnea on exertion, peripheral edema.  Respiratory: Negative for dyspnea at rest, dyspnea on exertion, cough, sputum, wheezing.  GI: See history of present illness. Endo: Negative for unusual weight change.  Heme: Negative for bruising or bleeding. Allergy: Negative for rash or hives.   Physical Exam: BP 106/69   Pulse 85   Temp 97.7 F (36.5 C) (Oral)   Ht 5\' 2"  (1.575 m)   Wt 135 lb 6.4 oz (61.4 kg)   BMI 24.76 kg/m  General:   Alert and oriented. Pleasant and cooperative. Well-nourished and well-developed.  Eyes:  Without icterus, sclera clear and conjunctiva pink.  Ears:  Normal auditory acuity. Cardiovascular:  S1, S2 present without murmurs appreciated. Extremities without clubbing or edema. Respiratory:  Clear to auscultation bilaterally. No wheezes, rales, or rhonchi. No distress.  Gastrointestinal:  +BS, soft, non-tender and non-distended. No HSM noted. No guarding or rebound. No masses appreciated.  Rectal:  Deferred  Musculoskalatal:  Symmetrical without gross deformities. Normal posture. Neurologic:  Alert and oriented x4;  grossly normal neurologically. Psych:  Alert and cooperative. Normal mood and affect. Heme/Lymph/Immune: No excessive bruising noted.    07/18/2017 11:02 AM   Disclaimer: This note was dictated with voice recognition software. Similar sounding words can inadvertently be transcribed and may not be corrected upon review.

## 2017-07-19 ENCOUNTER — Encounter: Payer: Self-pay | Admitting: Cardiology

## 2017-07-19 NOTE — Progress Notes (Signed)
cc'd to pcp 

## 2017-07-22 ENCOUNTER — Ambulatory Visit (INDEPENDENT_AMBULATORY_CARE_PROVIDER_SITE_OTHER): Payer: Medicare Other | Admitting: Thoracic Surgery (Cardiothoracic Vascular Surgery)

## 2017-07-22 ENCOUNTER — Other Ambulatory Visit: Payer: Self-pay

## 2017-07-22 ENCOUNTER — Encounter: Payer: Self-pay | Admitting: Thoracic Surgery (Cardiothoracic Vascular Surgery)

## 2017-07-22 VITALS — BP 130/84 | HR 85 | Resp 18 | Ht 62.0 in | Wt 143.6 lb

## 2017-07-22 DIAGNOSIS — Z8679 Personal history of other diseases of the circulatory system: Secondary | ICD-10-CM

## 2017-07-22 DIAGNOSIS — Z9889 Other specified postprocedural states: Secondary | ICD-10-CM

## 2017-07-22 NOTE — Progress Notes (Addendum)
[address]       [place] [address]             [phone]     CARDIOTHORACIC SURGERY OFFICE NOTE  Referring Provider is Branch, Alphonse Guild, MD PCP is Glenda Chroman, MD   HPI:  Patient is a 77 year old female with multiple medical problems who returns to the office today for routine follow-up 1 year status post minimally invasive mitral valve repair and Maze procedure on 07/05/2016 for mitral valve prolapse with severe symptomatic primary mitral regurgitation and long-standing persistent atrial fibrillation.  She was last seen here in our office on 11/05/2016 at which time she was doing well and maintaining sinus rhythm.  Just prior to that she had an echocardiogram performed which revealed normal left ventricular systolic function and intact mitral valve repair.  There was reportedly trivial residual mitral regurgitation.  Mean transvalvular gradient across the mitral valve was estimated 4 mmHg.  Since then the patient has developed atrial flutter for which she has been seen by Dr. [name] and Dr. Lovena Le in the EP clinic.  She has been scheduled for DC cardioversion next week.  She is currently on amiodarone and she remains chronically anticoagulated using warfarin.  She has not had any significant bleeding complications although she does state that she intermittently has had some episodes of bright red blood in the toilet which have been attributed to hemorrhoids.  She states that her INR has been up and down and somewhat difficult to regulate.  She reports that overall she feels well.  She denies significant exertional shortness of breath.  She has not had any chest pain or chest tightness.  She denies any palpitations or dizzy spells.   Current Outpatient Medications  Medication Sig Dispense Refill  . albuterol (PROVENTIL HFA;VENTOLIN HFA) 108 (90 BASE) MCG/ACT inhaler Inhale 2 puffs into the lungs every 6 (six) hours as needed for wheezing.    Marland Kitchen ALPRAZolam (XANAX)  0.5 MG tablet Take 0.5 mg by mouth 2 (two) times daily as needed (for anxiety/sleep (scheduled at bedtime)).     Marland Kitchen amiodarone (PACERONE) 200 MG tablet Take 1 tablet (200 mg total) by mouth daily. 90 tablet 3  . aspirin EC 81 MG EC tablet Take 1 tablet (81 mg total) by mouth daily.    . calcium carbonate (OS-CAL) 600 MG TABS tablet Take 600 mg by mouth 2 (two) times daily with a meal.    . Coenzyme Q10 100 MG TABS Take 100 mg by mouth every evening.     . diltiazem (CARDIZEM) 30 MG tablet Take 1 tablet (30 mg total) by mouth 2 (two) times daily as needed (palpitations). 60 tablet 6  . docusate sodium (COLACE) 100 MG capsule Take 200 mg by mouth daily as needed for mild constipation.     . furosemide (LASIX) 40 MG tablet Take 40 mg twice a day , may take 60 mg twice a day for fluid gain 186 tablet 1  . hydrocortisone (ANUSOL-HC) 2.5 % rectal cream Place 1 application rectally 2 (two) times daily. FOR UP TO 10 DAYS AT A TIME (Patient taking differently: Place 1 application rectally 2 (two) times daily as needed for hemorrhoids or itching. FOR UP TO 10 DAYS AT A TIME) 30 g 1  . metoprolol succinate (TOPROL-XL) 100 MG 24 hr tablet Take 100 mg by mouth 2 (two) times daily. Take with or immediately following a meal.    . Multiple Vitamin (MULTIVITAMIN)  tablet Take 1 tablet by mouth daily.      Marland Kitchen omeprazole (PRILOSEC) 20 MG capsule Take 1 capsule (20 mg total) by mouth daily. 30 capsule 3  . PARoxetine (PAXIL) 20 MG tablet Take 20 mg by mouth daily.     Vladimir Faster Glycol-Propyl Glycol (SYSTANE ULTRA) 0.4-0.3 % SOLN Place 1 drop into both eyes 3 (three) times daily.    . potassium chloride SA (K-DUR,KLOR-CON) 20 MEQ tablet Take 1 tablet (20 mEq total) by mouth 2 (two) times daily. 180 tablet 3  . simvastatin (ZOCOR) 40 MG tablet Take 40 mg daily by mouth.    . sodium chloride (OCEAN) 0.65 % SOLN nasal spray Place 1 spray into both nostrils as needed for congestion.    . traMADol (ULTRAM) 50 MG tablet Take 1  tablet (50 mg total) by mouth every 6 (six) hours as needed. 15 tablet 0  . umeclidinium-vilanterol (ANORO ELLIPTA) 62.5-25 MCG/INH AEPB Inhale 1 puff into the lungs daily. 30 each 6  . warfarin (COUMADIN) 2.5 MG tablet Take 1 tablet daily except 1 1/2 tablets on Sundays and Thursdays or as directed 100 tablet 3   No current facility-administered medications for this visit.       Physical Exam:   BP 130/84 (BP Location: Left Arm, Patient Position: Sitting, Cuff Size: Normal)   Pulse 85   Resp 18   Ht 5\' 2" (1.575 m)   Wt 143 lb 9.6 oz (65.1 kg)   SpO2 97% Comment: RA  BMI 26.26 kg/m   General:  Well-appearing  Chest:   Clear to auscultation  CV:   Regular rate and rhythm with no murmur appreciated  Incisions:  Completely healed  Abdomen:  Soft nontender  Extremities:  Warm and well perfused  Diagnostic Tests:  Transthoracic Echocardiography  Patient:    [name] [name], [name] [name] MR #:       [id number] Study Date: 10/26/2016 Gender:     F Age:        75 Height:     157.5 cm Weight:     72 .1 kg BSA:        1.8 m^2 Pt. Status: Room:   SONOGRAPHER  [facility]  ATTENDING    Kerry Hough, M.D.  Berna Spare, M.D.  REFERRING    Kerry Hough, M.D.  PERFORMING   [facility]  cc:  ------------------------------------------------------------------- LV EF: 60% -   65%  ------------------------------------------------------------------- Indications:      Shortness of breath 786.05.  ------------------------------------------------------------------- History:   PMH:  Mitral Valve Repair and Maze Procedure.  Atrial fibrillation.  Chronic obstructive pulmonary disease.  Risk factors:  Diabetes mellitus.  ------------------------------------------------------------------- Study Conclusions  - Left ventricle: The cavity size was normal. Wall thickness was   normal. Systolic function was normal. The estimated ejection   fraction was in the  range of 60% to 65%. Wall motion was normal;   there were no regional wall motion abnormalities. The study is   not technically sufficient to allow evaluation of LV diastolic   function. - Aortic valve: Moderately calcified annulus. Trileaflet. - Mitral valve: Status post complex valvuloplasty including   artificial Gore-tex neochord placement and Sorin Memo 3D Ring   Annuloplasty (size 74mm, catalog #[id number], serial [id number]). There   was trivial regurgitation. Mean gradient (D): 4 mm Hg. - Left atrium: The atrium was mildly dilated. - Right ventricle: The cavity size was mildly dilated. Systolic   function appears mildly reduced. - Right  atrium: The atrium was mildly to moderately dilated. - Tricuspid valve: There was moderate regurgitation. - Pulmonary arteries: PA peak pressure: 43 mm Hg (S).  ------------------------------------------------------------------- Study data:  Comparison was made to the study of 08/22/2016.  Study status:  Routine.  Procedure:  Transthoracic echocardiography. Image quality was adequate.          Transthoracic echocardiography.  M-mode, complete 2D, spectral Doppler, and color Doppler.  Birthdate:  Patient birthdate: [birth year].  Age:  Patient is 77 yr old.  Sex:  Gender: female.    BMI: 29.1 kg/m^2.  Blood pressure:     134/73  Patient status:  Outpatient.  Study date: Study date: 10/26/2016. Study time: 09:52 AM.  Location:  Echo laboratory.  -------------------------------------------------------------------  ------------------------------------------------------------------- Left ventricle:  The cavity size was normal. Wall thickness was normal. Systolic function was normal. The estimated ejection fraction was in the range of 60% to 65%. Wall motion was normal; there were no regional wall motion abnormalities. The study is not technically sufficient to allow evaluation of LV  diastolic function.  ------------------------------------------------------------------- Aortic valve:   Moderately calcified annulus. Trileaflet.  Doppler:   There was no stenosis.   There was no significant regurgitation.   ------------------------------------------------------------------- Aorta:  Aortic root: The aortic root was normal in size.  ------------------------------------------------------------------- Mitral valve:  Status post complex valvuloplasty including artificial Gore-tex neochord placement and Sorin Memo 3D Ring Annuloplasty (size 62mm, catalog #[id number], serial [id number]).  Doppler:  There was trivial regurgitation.    Valve area by pressure half-time: 1.91 cm^2. Indexed valve area by pressure half-time: 1.06 cm^2/m^2.    Mean gradient (D): 4 mm Hg. Peak gradient (D): 13 mm Hg.  ------------------------------------------------------------------- Left atrium:  The atrium was mildly dilated.  ------------------------------------------------------------------- Atrial septum:  No defect or patent foramen ovale was identified.   ------------------------------------------------------------------- Right ventricle:  The cavity size was mildly dilated. Systolic function appears mildly reduced.  ------------------------------------------------------------------- Pulmonic valve:    The valve appears to be grossly normal. Doppler:  There was no significant regurgitation.  ------------------------------------------------------------------- Tricuspid valve:   The valve appears to be grossly normal. Doppler:  There was moderate regurgitation.  ------------------------------------------------------------------- Right atrium:  The atrium was mildly to moderately dilated.  ------------------------------------------------------------------- Pericardium:  There was no pericardial  effusion.  ------------------------------------------------------------------- Systemic veins: Inferior vena cava: The vessel was normal in size. The respirophasic diameter changes were in the normal range (>= 50%), consistent with normal central venous pressure.  ------------------------------------------------------------------- Measurements   Left ventricle                           Value          Reference  LV ID, ED, PLAX chordal          (L)     39.4  mm       43 - 52  LV ID, ES, PLAX chordal                  23.1  mm       23 - 38  LV fx shortening, PLAX chordal           41    %        >=29  LV PW thickness, ED                      9.21  mm       ----------  IVS/LV PW ratio, ED  1.15           <=1.3  LV ejection fraction, 1-p A4C            71    %        ----------  LV end-diastolic volume, 2-p             54    ml       ----------  LV end-systolic volume, 2-p              20    ml       ----------  LV ejection fraction, 2-p                63    %        ----------  Stroke volume, 2-p                       34    ml       ----------  LV end-diastolic volume/bsa, 2-p         30    ml/m^2   ----------  LV end-systolic volume/bsa, 2-p          11    ml/m^2   ----------  Stroke volume/bsa, 2-p                   18.7  ml/m^2   ----------  LV e&', lateral                           6.96  cm/s     ----------  LV E/e&', lateral                         25.43          ----------  LV e&', medial                            5.98  cm/s     ----------  LV E/e&', medial                          29.6           ----------  LV e&', average                           6.47  cm/s     ----------  LV E/e&', average                         27.36          ----------    Ventricular septum                       Value          Reference  IVS thickness, ED                        10.6  mm       ----------    LVOT                                     Value  Reference  LVOT ID,  S                               20    mm       ----------  LVOT area                                3.14  cm^2     ----------    Aorta                                    Value          Reference  Aortic root ID, ED                       29    mm       ----------    Left atrium                              Value          Reference  LA ID, A-P, ES                           53    mm       ----------  LA ID/bsa, A-P                   ([name])     2.94  cm/m^2   <=2.2  LA volume, S                             68.3  ml       ----------  LA volume/bsa, S                         37.9  ml/m^2   ----------  LA volume, ES, 1-p A4C                   66.6  ml       ----------  LA volume/bsa, ES, 1-p A4C               37    ml/m^2   ----------  LA volume, ES, 1-p A2C                   64    ml       ----------  LA volume/bsa, ES, 1-p A2C               35.6  ml/m^2   ----------    Mitral valve                             Value          Reference  Mitral E-wave peak velocity              177   cm/s     ----------  Mitral A-wave peak velocity              48.2  cm/s     ----------  Mitral mean  velocity, D                  82.4  cm/s     ----------  Mitral deceleration time         ([name])     275   ms       150 - 230  Mitral pressure half-time                115   ms       ----------  Mitral mean gradient, D                  4     mm Hg    ----------  Mitral peak gradient, D                  13    mm Hg    ----------  Mitral E/A ratio, peak                   3.7            ----------  Mitral valve area, PHT, DP               1.91  cm^2     ----------  Mitral valve area/bsa, PHT, DP           1.06  cm^2/m^2 ----------  Mitral annulus VTI, D                    51.6  cm       ----------    Pulmonary arteries                       Value          Reference  PA pressure, S, DP               ([name])     43    mm Hg    <=30    Tricuspid valve                          Value          Reference  Tricuspid regurg peak  velocity           316   cm/s     ----------  Tricuspid peak RV-RA gradient            40    mm Hg    ----------    Right atrium                             Value          Reference  RA ID, S-I, ES, A4C              ([name])     53.9  mm       34 - 49  RA area, ES, A4C                 ([name])     21.2  cm^2     8.3 - 19.5  RA volume, ES, A/L                       68    ml       ----------  RA volume/bsa, ES, A/L  37.8  ml/m^2   ----------    Systemic veins                           Value          Reference  Estimated CVP                            3     mm Hg    ----------    Right ventricle                          Value          Reference  TAPSE                                    19.4  mm       ----------  RV pressure, S, DP               ([name])     43    mm Hg    <=30  Legend: (L)  and  ([name])  mark values outside specified reference range.  ------------------------------------------------------------------- Prepared and Electronically Authenticated by  Kate Sable, MD 2018-07-06T12:29:07   2 channel telemetry rhythm strip demonstrates what appears to be atrial flutter  Impression:  Patient is doing well clinically a proximally 1 year status post minimally invasive mitral valve repair and Maze procedure although she has developed persistent atrial flutter and has been scheduled for cardioversion next week.  She remains anticoagulated using warfarin.  She is currently on amiodarone.   Plan:  We have not recommended any change the patient's current medications.  The patient has been reminded regarding the importance of dental hygiene and the lifelong need for antibiotic prophylaxis for all dental cleanings and other related invasive procedures.  She will continue to follow-up with Dr. [name] and Dr. Lovena Le and return to our office in the future only should specific problems or questions arise.   I spent in excess of 15 minutes during the conduct of this office  consultation and >50% of this time involved direct face-to-face encounter with the patient for counseling and/or coordination of their care.   Valentina Gu. Roxy Manns, MD 07/22/2017 10:47 AM

## 2017-07-22 NOTE — Patient Instructions (Signed)

## 2017-07-23 ENCOUNTER — Ambulatory Visit (INDEPENDENT_AMBULATORY_CARE_PROVIDER_SITE_OTHER): Payer: Medicare Other | Admitting: *Deleted

## 2017-07-23 DIAGNOSIS — I481 Persistent atrial fibrillation: Secondary | ICD-10-CM

## 2017-07-23 DIAGNOSIS — I4819 Other persistent atrial fibrillation: Secondary | ICD-10-CM

## 2017-07-23 DIAGNOSIS — Z9889 Other specified postprocedural states: Secondary | ICD-10-CM | POA: Diagnosis not present

## 2017-07-23 DIAGNOSIS — Z5181 Encounter for therapeutic drug level monitoring: Secondary | ICD-10-CM | POA: Diagnosis not present

## 2017-07-23 LAB — POCT INR: INR: 2

## 2017-07-23 NOTE — Patient Instructions (Signed)
Increase coumadin to 1 tablet daily except 1 1/2 tablet on Sundays, Tuesdays and Thursdays Recheck in 1 week Pending DCCV 07/31/17

## 2017-07-29 ENCOUNTER — Ambulatory Visit: Payer: Self-pay | Admitting: Gastroenterology

## 2017-07-30 ENCOUNTER — Ambulatory Visit (INDEPENDENT_AMBULATORY_CARE_PROVIDER_SITE_OTHER): Payer: Medicare Other | Admitting: *Deleted

## 2017-07-30 DIAGNOSIS — M159 Polyosteoarthritis, unspecified: Secondary | ICD-10-CM | POA: Diagnosis not present

## 2017-07-30 DIAGNOSIS — I4819 Other persistent atrial fibrillation: Secondary | ICD-10-CM

## 2017-07-30 DIAGNOSIS — E7800 Pure hypercholesterolemia, unspecified: Secondary | ICD-10-CM | POA: Diagnosis not present

## 2017-07-30 DIAGNOSIS — Z9889 Other specified postprocedural states: Secondary | ICD-10-CM

## 2017-07-30 DIAGNOSIS — I481 Persistent atrial fibrillation: Secondary | ICD-10-CM | POA: Diagnosis not present

## 2017-07-30 DIAGNOSIS — J449 Chronic obstructive pulmonary disease, unspecified: Secondary | ICD-10-CM | POA: Diagnosis not present

## 2017-07-30 DIAGNOSIS — I4891 Unspecified atrial fibrillation: Secondary | ICD-10-CM | POA: Diagnosis not present

## 2017-07-30 DIAGNOSIS — Z5181 Encounter for therapeutic drug level monitoring: Secondary | ICD-10-CM | POA: Diagnosis not present

## 2017-07-30 LAB — POCT INR: INR: 3

## 2017-07-30 NOTE — Patient Instructions (Signed)
Continue coumadin 1 tablet daily except 1 1/2 tablet on Sundays, Tuesdays and Thursdays Recheck in 1 week Scheduled for DCCV 4/26

## 2017-07-31 ENCOUNTER — Ambulatory Visit: Payer: Self-pay | Admitting: Cardiology

## 2017-07-31 ENCOUNTER — Telehealth: Payer: Self-pay | Admitting: *Deleted

## 2017-07-31 ENCOUNTER — Encounter: Payer: Self-pay | Admitting: *Deleted

## 2017-07-31 NOTE — Telephone Encounter (Signed)
-----   Message from Orinda Kenner sent at 07/30/2017  1:35 PM EDT ----- Hey girl,  Cardioversion Friday 4/26 @ 10:00. Register @ [place] @ 8:30am.  Pre op Monday 4/22 @ 1:45 @ [facility].  Thanks, Coralyn Mark  ----- Message ----- From: Massie Maroon, CMA Sent: 07/30/2017   1:27 PM To: [place] girl, can you schedule a cardioversion for this pt? Doesn't matter provider and for afib not urgent. Thank you! I can call her  Alexsis Kathman

## 2017-07-31 NOTE — Telephone Encounter (Signed)
Pt made aware and voiced understanding - also scheduled f/u appt with Dr branch - will mail pt instruction letter

## 2017-08-02 DIAGNOSIS — Z23 Encounter for immunization: Secondary | ICD-10-CM | POA: Diagnosis not present

## 2017-08-06 ENCOUNTER — Ambulatory Visit (INDEPENDENT_AMBULATORY_CARE_PROVIDER_SITE_OTHER): Payer: Medicare Other | Admitting: *Deleted

## 2017-08-06 DIAGNOSIS — I481 Persistent atrial fibrillation: Secondary | ICD-10-CM

## 2017-08-06 DIAGNOSIS — Z5181 Encounter for therapeutic drug level monitoring: Secondary | ICD-10-CM | POA: Diagnosis not present

## 2017-08-06 DIAGNOSIS — Z9889 Other specified postprocedural states: Secondary | ICD-10-CM | POA: Diagnosis not present

## 2017-08-06 DIAGNOSIS — I4819 Other persistent atrial fibrillation: Secondary | ICD-10-CM

## 2017-08-06 LAB — POCT INR: INR: 3.2

## 2017-08-06 NOTE — Patient Instructions (Signed)
Continue coumadin 1 tablet daily except 1 1/2 tablet on Sundays, Tuesdays and Thursdays Recheck in 1 week Scheduled for DCCV 4/26

## 2017-08-08 NOTE — Patient Instructions (Signed)
[name] [name]  08/08/2017     @PREFPERIOPPHARMACY @   Your procedure is scheduled on  08/16/2017 .  Report to [facility] at  800   A.M.  Call this number if you have problems the morning of surgery:  [phone]   Remember:  Do not eat food or drink liquids after midnight.  Take these medicines the morning of surgery with A SIP OF WATER  Xanax, diltiazem, pacerone, prilosec, paxil, ultram. Use your inhaler and your nebulizer before you come. DO NOT MISS ANY DOSES OF YOUR COUMADIN.   Do not wear jewelry, make-up or nail polish.  Do not wear lotions, powders, or perfumes, or deodorant.  Do not shave 48 hours prior to surgery.  Men may shave face and neck.  Do not bring valuables to the hospital.  [facility] is not responsible for any belongings or valuables.  Contacts, dentures or bridgework may not be worn into surgery.  Leave your suitcase in the car.  After surgery it may be brought to your room.  For patients admitted to the hospital, discharge time will be determined by your treatment team.  Patients discharged the day of surgery will not be allowed to drive home.   Name and phone number of your driver:   Family Special instructions:  None  Please read over the following fact sheets that you were given. Anesthesia Post-op Instructions and Care and Recovery After Surgery       Electrical Cardioversion Electrical cardioversion is the delivery of a jolt of electricity to restore a normal rhythm to the heart. A rhythm that is too fast or is not regular keeps the heart from pumping well. In this procedure, sticky patches or metal paddles are placed on the chest to deliver electricity to the heart from a device. This procedure may be done in an emergency if:  There is low or no blood pressure as a result of the heart rhythm.  Normal rhythm must be restored as fast as possible to protect the brain and heart from further damage.  It may save a  life.  This procedure may also be done for irregular or fast heart rhythms that are not immediately life-threatening. Tell a health care provider about:  Any allergies you have.  All medicines you are taking, including vitamins, herbs, eye drops, creams, and over-the-counter medicines.  Any problems you or family members have had with anesthetic medicines.  Any blood disorders you have.  Any surgeries you have had.  Any medical conditions you have.  Whether you are pregnant or may be pregnant. What are the risks? Generally, this is a safe procedure. However, problems may occur, including:  Allergic reactions to medicines.  A blood clot that breaks free and travels to other parts of your body.  The possible return of an abnormal heart rhythm within hours or days after the procedure.  Your heart stopping (cardiac arrest). This is rare.  What happens before the procedure? Medicines  Your health care provider may have you start taking: ? Blood-thinning medicines (anticoagulants) so your blood does not clot as easily. ? Medicines may be given to help stabilize your heart rate and rhythm.  Ask your health care provider about changing or stopping your regular medicines. This is especially important if you are taking diabetes medicines or blood thinners. General instructions  Plan to have someone take you home from the hospital or clinic.  If you will be  going home right after the procedure, plan to have someone with you for 24 hours.  Follow instructions from your health care provider about eating or drinking restrictions. What happens during the procedure?  To lower your risk of infection: ? Your health care team will wash or sanitize their hands. ? Your skin will be washed with soap.  An IV tube will be inserted into one of your veins.  You will be given a medicine to help you relax (sedative).  Sticky patches (electrodes) or metal paddles may be placed on your  chest.  An electrical shock will be delivered. The procedure may vary among health care providers and hospitals. What happens after the procedure?  Your blood pressure, heart rate, breathing rate, and blood oxygen level will be monitored until the medicines you were given have worn off.  Do not drive for 24 hours if you were given a sedative.  Your heart rhythm will be watched to make sure it does not change. This information is not intended to replace advice given to you by your health care provider. Make sure you discuss any questions you have with your health care provider. Document Released: 03/30/2002 Document Revised: 12/07/2015 Document Reviewed: 10/14/2015 Elsevier Interactive Patient Education  2017 [employer].  Electrical Cardioversion, Care After This sheet gives you information about how to care for yourself after your procedure. Your health care provider may also give you more specific instructions. If you have problems or questions, contact your health care provider. What can I expect after the procedure? After the procedure, it is common to have:  Some redness on the skin where the shocks were given.  Follow these instructions at home:  Do not drive for 24 hours if you were given a medicine to help you relax (sedative).  Take over-the-counter and prescription medicines only as told by your health care provider.  Ask your health care provider how to check your pulse. Check it often.  Rest for 48 hours after the procedure or as told by your health care provider.  Avoid or limit your caffeine use as told by your health care provider. Contact a health care provider if:  You feel like your heart is beating too quickly or your pulse is not regular.  You have a serious muscle cramp that does not go away. Get help right away if:  You have discomfort in your chest.  You are dizzy or you feel faint.  You have trouble breathing or you are short of breath.  Your  speech is slurred.  You have trouble moving an arm or leg on one side of your body.  Your fingers or toes turn cold or blue. This information is not intended to replace advice given to you by your health care provider. Make sure you discuss any questions you have with your health care provider. Document Released: 01/28/2013 Document Revised: 11/11/2015 Document Reviewed: 10/14/2015 Elsevier Interactive Patient Education  2018 [place] Anesthesia is a term that refers to techniques, procedures, and medicines that help a person stay safe and comfortable during a medical procedure. Monitored anesthesia care, or sedation, is one type of anesthesia. Your anesthesia specialist may recommend sedation if you will be having a procedure that does not require you to be unconscious, such as:  Cataract surgery.  A dental procedure.  A biopsy.  A colonoscopy.  During the procedure, you may receive a medicine to help you relax (sedative). There are three levels of sedation:  Mild sedation. At this level, you may feel awake and relaxed. You will be able to follow directions.  Moderate sedation. At this level, you will be sleepy. You may not remember the procedure.  Deep sedation. At this level, you will be asleep. You will not remember the procedure.  The more medicine you are given, the deeper your level of sedation will be. Depending on how you respond to the procedure, the anesthesia specialist may change your level of sedation or the type of anesthesia to fit your needs. An anesthesia specialist will monitor you closely during the procedure. Let your health care provider know about:  Any allergies you have.  All medicines you are taking, including vitamins, herbs, eye drops, creams, and over-the-counter medicines.  Any use of steroids (by mouth or as a cream).  Any problems you or family members have had with sedatives and anesthetic medicines.  Any blood  disorders you have.  Any surgeries you have had.  Any medical conditions you have, such as sleep apnea.  Whether you are pregnant or may be pregnant.  Any use of cigarettes, alcohol, or street drugs. What are the risks? Generally, this is a safe procedure. However, problems may occur, including:  Getting too much medicine (oversedation).  Nausea.  Allergic reaction to medicines.  Trouble breathing. If this happens, a breathing tube may be used to help with breathing. It will be removed when you are awake and breathing on your own.  Heart trouble.  Lung trouble.  Before the procedure Staying hydrated Follow instructions from your health care provider about hydration, which may include:  Up to 2 hours before the procedure - you may continue to drink clear liquids, such as water, clear fruit juice, black coffee, and plain tea.  Eating and drinking restrictions Follow instructions from your health care provider about eating and drinking, which may include:  8 hours before the procedure - stop eating heavy meals or foods such as meat, fried foods, or fatty foods.  6 hours before the procedure - stop eating light meals or foods, such as toast or cereal.  6 hours before the procedure - stop drinking milk or drinks that contain milk.  2 hours before the procedure - stop drinking clear liquids.  Medicines Ask your health care provider about:  Changing or stopping your regular medicines. This is especially important if you are taking diabetes medicines or blood thinners.  Taking medicines such as aspirin and ibuprofen. These medicines can thin your blood. Do not take these medicines before your procedure if your health care provider instructs you not to.  Tests and exams  You will have a physical exam.  You may have blood tests done to show: ? How well your kidneys and liver are working. ? How well your blood can clot.  General instructions  Plan to have someone take  you home from the hospital or clinic.  If you will be going home right after the procedure, plan to have someone with you for 24 hours.  What happens during the procedure?  Your blood pressure, heart rate, breathing, level of pain and overall condition will be monitored.  An IV tube will be inserted into one of your veins.  Your anesthesia specialist will give you medicines as needed to keep you comfortable during the procedure. This may mean changing the level of sedation.  The procedure will be performed. After the procedure  Your blood pressure, heart rate, breathing rate, and blood oxygen level will be  monitored until the medicines you were given have worn off.  Do not drive for 24 hours if you received a sedative.  You may: ? Feel sleepy, clumsy, or nauseous. ? Feel forgetful about what happened after the procedure. ? Have a sore throat if you had a breathing tube during the procedure. ? Vomit. This information is not intended to replace advice given to you by your health care provider. Make sure you discuss any questions you have with your health care provider. Document Released: 01/03/2005 Document Revised: 09/16/2015 Document Reviewed: 07/31/2015 Elsevier Interactive Patient Education  2018 [place], Care After These instructions provide you with information about caring for yourself after your procedure. Your health care provider may also give you more specific instructions. Your treatment has been planned according to current medical practices, but problems sometimes occur. Call your health care provider if you have any problems or questions after your procedure. What can I expect after the procedure? After your procedure, it is common to:  Feel sleepy for several hours.  Feel clumsy and have poor balance for several hours.  Feel forgetful about what happened after the procedure.  Have poor judgment for several hours.  Feel nauseous or  vomit.  Have a sore throat if you had a breathing tube during the procedure.  Follow these instructions at home: For at least 24 hours after the procedure:   Do not: ? Participate in activities in which you could fall or become injured. ? Drive. ? Use heavy machinery. ? Drink alcohol. ? Take sleeping pills or medicines that cause drowsiness. ? Make important decisions or sign legal documents. ? Take care of children on your own.  Rest. Eating and drinking  Follow the diet that is recommended by your health care provider.  If you vomit, drink water, juice, or soup when you can drink without vomiting.  Make sure you have little or no nausea before eating solid foods. General instructions  Have a responsible adult stay with you until you are awake and alert.  Take over-the-counter and prescription medicines only as told by your health care provider.  If you smoke, do not smoke without supervision.  Keep all follow-up visits as told by your health care provider. This is important. Contact a health care provider if:  You keep feeling nauseous or you keep vomiting.  You feel light-headed.  You develop a rash.  You have a fever. Get help right away if:  You have trouble breathing. This information is not intended to replace advice given to you by your health care provider. Make sure you discuss any questions you have with your health care provider. Document Released: 07/31/2015 Document Revised: 11/30/2015 Document Reviewed: 07/31/2015 Elsevier Interactive Patient Education  [employer].

## 2017-08-09 ENCOUNTER — Other Ambulatory Visit: Payer: Self-pay | Admitting: Cardiology

## 2017-08-12 ENCOUNTER — Encounter (HOSPITAL_COMMUNITY): Payer: Self-pay

## 2017-08-12 ENCOUNTER — Encounter (HOSPITAL_COMMUNITY)
Admission: RE | Admit: 2017-08-12 | Discharge: 2017-08-12 | Disposition: A | Discharge status: Home / Self Care | Payer: Medicare Other | Source: Ambulatory Visit | Attending: Cardiology | Admitting: Cardiology

## 2017-08-12 ENCOUNTER — Other Ambulatory Visit: Payer: Self-pay

## 2017-08-12 DIAGNOSIS — Z01812 Encounter for preprocedural laboratory examination: Secondary | ICD-10-CM | POA: Diagnosis not present

## 2017-08-12 LAB — CBC WITH DIFFERENTIAL/PLATELET
BASOS PCT: 1 %
Basophils Absolute: 0 10*3/uL (ref 0.0–0.1)
Eosinophils Absolute: 0.2 10*3/uL (ref 0.0–0.7)
Eosinophils Relative: 3 %
HEMATOCRIT: 38.3 % (ref 36.0–46.0)
HEMOGLOBIN: 11.6 g/dL — AB (ref 12.0–15.0)
LYMPHS ABS: 1.1 10*3/uL (ref 0.7–4.0)
LYMPHS PCT: 18 %
MCH: 30.2 pg (ref 26.0–34.0)
MCHC: 30.3 g/dL (ref 30.0–36.0)
MCV: 99.7 fL (ref 78.0–100.0)
MONO ABS: 0.8 10*3/uL (ref 0.1–1.0)
MONOS PCT: 12 %
NEUTROS ABS: 4.1 10*3/uL (ref 1.7–7.7)
NEUTROS PCT: 66 %
Platelets: 292 10*3/uL (ref 150–400)
RBC: 3.84 MIL/uL — ABNORMAL LOW (ref 3.87–5.11)
RDW: 14.5 % (ref 11.5–15.5)
WBC: 6.2 10*3/uL (ref 4.0–10.5)

## 2017-08-12 LAB — BASIC METABOLIC PANEL
Anion gap: 17 — ABNORMAL HIGH (ref 5–15)
BUN: 15 mg/dL (ref 6–20)
CHLORIDE: 100 mmol/L — AB (ref 101–111)
CO2: 27 mmol/L (ref 22–32)
CREATININE: 1.01 mg/dL — AB (ref 0.44–1.00)
Calcium: 9.2 mg/dL (ref 8.9–10.3)
GFR calc non Af Amer: 52 mL/min — ABNORMAL LOW (ref >60)
GLUCOSE: 96 mg/dL (ref 65–99)
Potassium: 3.8 mmol/L (ref 3.5–5.1)
Sodium: 144 mmol/L (ref 135–145)

## 2017-08-13 ENCOUNTER — Ambulatory Visit (INDEPENDENT_AMBULATORY_CARE_PROVIDER_SITE_OTHER): Payer: Medicare Other | Admitting: *Deleted

## 2017-08-13 DIAGNOSIS — Z5181 Encounter for therapeutic drug level monitoring: Secondary | ICD-10-CM | POA: Diagnosis not present

## 2017-08-13 DIAGNOSIS — I481 Persistent atrial fibrillation: Secondary | ICD-10-CM | POA: Diagnosis not present

## 2017-08-13 DIAGNOSIS — Z9889 Other specified postprocedural states: Secondary | ICD-10-CM | POA: Diagnosis not present

## 2017-08-13 DIAGNOSIS — I4819 Other persistent atrial fibrillation: Secondary | ICD-10-CM

## 2017-08-13 LAB — POCT INR: INR: 3.4

## 2017-08-13 NOTE — Patient Instructions (Signed)
Continue coumadin 1 tablet daily except 1 1/2 tablet on Sundays, Tuesdays and Thursdays Recheck in 1 week Scheduled for DCCV 4/26

## 2017-08-14 ENCOUNTER — Telehealth: Payer: Self-pay | Admitting: *Deleted

## 2017-08-14 NOTE — Telephone Encounter (Signed)
-----   Message from Arnoldo Lenis, MD sent at 08/14/2017  2:30 PM EDT ----- Labs look fine   [name] MD

## 2017-08-14 NOTE — Telephone Encounter (Signed)
Pt aware - routed to pcp  

## 2017-08-16 ENCOUNTER — Encounter (HOSPITAL_COMMUNITY)
Admission: RE | Disposition: A | Discharge status: Home / Self Care | Payer: Self-pay | Source: Ambulatory Visit | Attending: Cardiology

## 2017-08-16 ENCOUNTER — Encounter (HOSPITAL_COMMUNITY): Payer: Self-pay | Admitting: *Deleted

## 2017-08-16 ENCOUNTER — Ambulatory Visit (HOSPITAL_COMMUNITY)
Admission: RE | Admit: 2017-08-16 | Discharge: 2017-08-16 | Disposition: A | Discharge status: Home / Self Care | Payer: Medicare Other | Source: Ambulatory Visit | Attending: Cardiology | Admitting: Cardiology

## 2017-08-16 ENCOUNTER — Ambulatory Visit (HOSPITAL_COMMUNITY): Payer: Medicare Other | Admitting: Anesthesiology

## 2017-08-16 DIAGNOSIS — Z79899 Other long term (current) drug therapy: Secondary | ICD-10-CM | POA: Insufficient documentation

## 2017-08-16 DIAGNOSIS — K219 Gastro-esophageal reflux disease without esophagitis: Secondary | ICD-10-CM | POA: Diagnosis not present

## 2017-08-16 DIAGNOSIS — I5032 Chronic diastolic (congestive) heart failure: Secondary | ICD-10-CM | POA: Diagnosis not present

## 2017-08-16 DIAGNOSIS — I272 Pulmonary hypertension, unspecified: Secondary | ICD-10-CM | POA: Diagnosis not present

## 2017-08-16 DIAGNOSIS — I7101 Dissection of thoracic aorta: Secondary | ICD-10-CM

## 2017-08-16 DIAGNOSIS — Z885 Allergy status to narcotic agent status: Secondary | ICD-10-CM | POA: Insufficient documentation

## 2017-08-16 DIAGNOSIS — Z888 Allergy status to other drugs, medicaments and biological substances status: Secondary | ICD-10-CM | POA: Insufficient documentation

## 2017-08-16 DIAGNOSIS — Z87442 Personal history of urinary calculi: Secondary | ICD-10-CM | POA: Diagnosis not present

## 2017-08-16 DIAGNOSIS — I340 Nonrheumatic mitral (valve) insufficiency: Secondary | ICD-10-CM | POA: Insufficient documentation

## 2017-08-16 DIAGNOSIS — E119 Type 2 diabetes mellitus without complications: Secondary | ICD-10-CM | POA: Diagnosis not present

## 2017-08-16 DIAGNOSIS — E7800 Pure hypercholesterolemia, unspecified: Secondary | ICD-10-CM | POA: Insufficient documentation

## 2017-08-16 DIAGNOSIS — Z85038 Personal history of other malignant neoplasm of large intestine: Secondary | ICD-10-CM | POA: Diagnosis not present

## 2017-08-16 DIAGNOSIS — M3313 Other dermatomyositis without myopathy: Secondary | ICD-10-CM | POA: Diagnosis not present

## 2017-08-16 DIAGNOSIS — I110 Hypertensive heart disease with heart failure: Secondary | ICD-10-CM | POA: Insufficient documentation

## 2017-08-16 DIAGNOSIS — J449 Chronic obstructive pulmonary disease, unspecified: Secondary | ICD-10-CM | POA: Diagnosis not present

## 2017-08-16 DIAGNOSIS — F329 Major depressive disorder, single episode, unspecified: Secondary | ICD-10-CM | POA: Diagnosis not present

## 2017-08-16 DIAGNOSIS — I509 Heart failure, unspecified: Secondary | ICD-10-CM | POA: Diagnosis not present

## 2017-08-16 DIAGNOSIS — I480 Paroxysmal atrial fibrillation: Secondary | ICD-10-CM | POA: Diagnosis not present

## 2017-08-16 DIAGNOSIS — I4891 Unspecified atrial fibrillation: Secondary | ICD-10-CM | POA: Diagnosis not present

## 2017-08-16 DIAGNOSIS — Z8719 Personal history of other diseases of the digestive system: Secondary | ICD-10-CM | POA: Insufficient documentation

## 2017-08-16 DIAGNOSIS — J45909 Unspecified asthma, uncomplicated: Secondary | ICD-10-CM | POA: Diagnosis not present

## 2017-08-16 DIAGNOSIS — F419 Anxiety disorder, unspecified: Secondary | ICD-10-CM | POA: Insufficient documentation

## 2017-08-16 DIAGNOSIS — Z7901 Long term (current) use of anticoagulants: Secondary | ICD-10-CM | POA: Diagnosis not present

## 2017-08-16 DIAGNOSIS — Z7982 Long term (current) use of aspirin: Secondary | ICD-10-CM | POA: Diagnosis not present

## 2017-08-16 DIAGNOSIS — I71019 Dissection of thoracic aorta, unspecified: Secondary | ICD-10-CM

## 2017-08-16 HISTORY — PX: CARDIOVERSION: SHX1299

## 2017-08-16 SURGERY — CARDIOVERSION
Anesthesia: Monitor Anesthesia Care

## 2017-08-16 MED ORDER — ATROPINE SULFATE 0.4 MG/ML IJ SOLN
INTRAMUSCULAR | Status: AC
  Filled 2017-08-16: qty 1

## 2017-08-16 MED ORDER — EPHEDRINE SULFATE 50 MG/ML IJ SOLN
INTRAMUSCULAR | Status: AC
  Filled 2017-08-16: qty 1

## 2017-08-16 MED ORDER — MIDAZOLAM HCL 5 MG/5ML IJ SOLN
INTRAMUSCULAR | Status: DC | PRN
  Administered 2017-08-16: 2 mg via INTRAVENOUS

## 2017-08-16 MED ORDER — SODIUM CHLORIDE 0.9 % IJ SOLN
INTRAMUSCULAR | Status: AC
  Filled 2017-08-16: qty 10

## 2017-08-16 MED ORDER — MIDAZOLAM HCL 2 MG/2ML IJ SOLN
INTRAMUSCULAR | Status: AC
  Filled 2017-08-16: qty 2

## 2017-08-16 MED ORDER — PROPOFOL 10 MG/ML IV BOLUS
INTRAVENOUS | Status: AC
  Filled 2017-08-16: qty 20

## 2017-08-16 MED ORDER — LACTATED RINGERS IV SOLN: INTRAVENOUS | Status: DC

## 2017-08-16 MED ORDER — FENTANYL CITRATE (PF) 100 MCG/2ML IJ SOLN
INTRAMUSCULAR | Status: AC
  Filled 2017-08-16: qty 2

## 2017-08-16 MED ORDER — PROPOFOL 500 MG/50ML IV EMUL
INTRAVENOUS | Status: DC | PRN
  Administered 2017-08-16: 150 ug/kg/min via INTRAVENOUS

## 2017-08-16 MED ORDER — METOPROLOL SUCCINATE ER 50 MG PO TB24: 50.0000 mg | ORAL_TABLET | Freq: Every day | ORAL | 11 refills | Status: DC

## 2017-08-16 MED ORDER — LACTATED RINGERS IV SOLN
INTRAVENOUS | Status: DC | PRN
  Administered 2017-08-16: 10:00:00 via INTRAVENOUS

## 2017-08-16 NOTE — CV Procedure (Addendum)
Procedure note  Procedure: electrical cardioversion Physician: Dr Carlyle Dolly Indication: atrial fibrillation   Patient brought to procedure suite after appropriate consent was obtained. Sedation was achieved with the assistance of anesthesiology, for details please refer to there note. Defib pad were placed int he anterior and posterior position. She was succesfully cardioverted to sinus bradycardia in mid 40s with a single 200j synchronized shock. Cardiopulmonary monitoring was performed throughout, she tolerated the procedure without complications.   Carlyle Dolly MD   Addendum Sinus bradycardia high 40s after procedure. Patient took her toprol this AM, we will lower the dose from 100mg  bid to 50mg  daily   [name] MD MD

## 2017-08-16 NOTE — Anesthesia Preprocedure Evaluation (Signed)
Anesthesia Evaluation  Patient identified by MRN, date of birth, ID band Patient awake    Reviewed: Allergy & Precautions, H&P , NPO status , Patient's Chart, lab work & pertinent test results, reviewed documented beta blocker date and time   History of Anesthesia Complications (+) PONV and history of anesthetic complications  Airway Mallampati: II  TM Distance: >3 FB Neck ROM: full    Dental no notable dental hx.    Pulmonary neg pulmonary ROS, shortness of breath, asthma , COPD,    Pulmonary exam normal breath sounds clear to auscultation       Cardiovascular Exercise Tolerance: Good +CHF  negative cardio ROS  Atrial Fibrillation + Valvular Problems/Murmurs  Rhythm:regular Rate:Normal  S/P minimally invasive mitral valve repair   Neuro/Psych  Headaches, PSYCHIATRIC DISORDERS Anxiety Depression  Neuromuscular disease negative neurological ROS  negative psych ROS   GI/Hepatic negative GI ROS, Neg liver ROS, hiatal hernia, GERD  ,  Endo/Other  negative endocrine ROS  Renal/GU negative Renal ROS  negative genitourinary   Musculoskeletal   Abdominal   Peds  Hematology negative hematology ROS (+)   Anesthesia Other Findings   Reproductive/Obstetrics negative OB ROS                             Anesthesia Physical Anesthesia Plan  ASA: III  Anesthesia Plan: MAC   Post-op Pain Management:    Induction:   PONV Risk Score and Plan:   Airway Management Planned:   Additional Equipment:   Intra-op Plan:   Post-operative Plan:   Informed Consent: I have reviewed the patients History and Physical, chart, labs and discussed the procedure including the risks, benefits and alternatives for the proposed anesthesia with the patient or authorized representative who has indicated his/her understanding and acceptance.   Dental Advisory Given  Plan Discussed with: CRNA  Anesthesia Plan  Comments:         Anesthesia Quick Evaluation

## 2017-08-16 NOTE — H&P (Signed)
For full medical history please refer to the referenced recent clinic note below. Patient presents for electrical cardioversion for afib/aflutter. She has been on amiodarone, has been on coumadin with therapeutic INRs as reported below for more than 3 weeks.   INRs 4/23 3.4  4/16 3/2 4/9 3 4/2 2   Carlyle Dolly MD         Clinical Summary Ms. Innocent is a 77 y.o.female seen today for follow up of the following medical problems.     1. Mitral regurgitation - echo at last [facility] Jan 2017 showed normal LVEF, 3+(modarte)eccentric MR with immobile posterior leaflet.  - repeat echo 01/2016 moderate to severe MR. LVEF 60-65%. LVIDs 31. Symptoms unchanged since last visit.  - 01/2016 TEE moderate to severe eccentric MR, severe TR.  - she is s/p mitral valve repair 07/05/16, Sorin Memo 3D Ring Annuloplasty (size 8mm, catalog #[id number], serial [id number]) - repeat echo 10/2016 with LVEF 60-65%, no WMAs, normal MV repair and ring, moderate TR.  - goal weight 145-150 - she has completed cardiac rehab  - she denies any recent symptoms.   2.Paroxysmal afib/ Aflutter - s/p MAZE procedure during recent MV repair -no recurrent afib but recent issues with atypical aflutter.Followed by EP, recs are for cardioversion if INR at goal - has been on amiodarone.   - recent issues with tachycardia. Seen by EP, being managed for atypical aflutter. Plans for DCCV however patient with recent subtherapeutic INR and procedure was postponsed  3/19 INR 3.2 3/12 INR 3.7 2/28 4.5   3. GI bleed -followed by GI - notes indicate hemorroidal bleeding   4. COPD -followed by Dr Luan Pulling   5. HTN -compliant with meds  6. Leg edema - intermittent, improves with extra lasix as needed.       Past Medical History:  Diagnosis Date  . Anxiety   . Arthritis   . Asthma   . Atrial fibrillation, persistent ([place])   . Chronic diastolic congestive heart failure ([place])   .  Colon adenoma   . Colon cancer ([place])    status post low anterior resection, limited stage disease requiring no adjuvant therapy  . COPD (chronic obstructive pulmonary disease) ([place])   . Depression   . Diverticulosis   . DM (dermatomyositis)   . DVT (deep venous thrombosis) ([name])    in leg- long time ago  . Dyspnea    with activity  . Dysrhythmia   . Esophageal dysphagia   . GERD (gastroesophageal reflux disease)   . Headache   . Heart murmur   . Hematuria   . Hemorrhoids   . Hiatal hernia   . History of kidney stones    x 2  . Hypercholesterolemia   . Incidental pulmonary nodule 05/08/2016   8 mm vague opacity RML noted on CT scan  . Mitral regurgitation   . PONV (postoperative nausea and vomiting) 2003 ish    with breast biopsy  . S/P minimally invasive maze operation for atrial fibrillation 07/05/2016   Complete bilateral atrial lesion set using cryothermy and bipolar radiofrequency ablation with clipping of LA appendage via right mini thoracotomy approach  . 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 Schatzki's ring   . Tricuspid regurgitation           Allergies  Allergen Reactions  . Iohexol Hives and Shortness Of  Breath     Pt. had a severe allergic reaction to IV contrast the last time she was injected and had to be seen in the ER.   Marland Kitchen Hydrocodone-Acetaminophen Hives  . Nabumetone Rash  . Prednisone Rash           Current Outpatient Medications  Medication Sig Dispense Refill  . albuterol (PROVENTIL HFA;VENTOLIN HFA) 108 (90 BASE) MCG/ACT inhaler Inhale 2 puffs into the lungs every 6 (six) hours as needed for wheezing.    Marland Kitchen ALPRAZolam (XANAX) 0.5 MG tablet Take 0.5 mg by mouth 2 (two) times daily as needed (for anxiety/sleep (scheduled at bedtime)).     Marland Kitchen  amiodarone (PACERONE) 200 MG tablet Take 1 tablet (200 mg total) by mouth daily. 90 tablet 3  . aspirin EC 81 MG EC tablet Take 1 tablet (81 mg total) by mouth daily.    . calcium carbonate (OS-CAL) 600 MG TABS tablet Take 600 mg by mouth 2 (two) times daily with a meal.    . Coenzyme Q10 100 MG TABS Take 100 mg by mouth every evening.     . diltiazem (CARDIZEM) 30 MG tablet Take 1 tablet (30 mg total) by mouth 2 (two) times daily as needed (palpitations). 60 tablet 6  . docusate sodium (COLACE) 100 MG capsule Take 200 mg by mouth daily as needed for mild constipation.     . furosemide (LASIX) 40 MG tablet TAKE 1 & 1/2 TABLETS 2 TIMES DAILY FOR 2 DAYS THEN RESUME 1 TABLET 2 TIMES DAILY 186 tablet 1  . hydrocortisone (ANUSOL-HC) 2.5 % rectal cream Place 1 application rectally 2 (two) times daily. FOR UP TO 10 DAYS AT A TIME (Patient taking differently: Place 1 application rectally 2 (two) times daily as needed for hemorrhoids or itching. FOR UP TO 10 DAYS AT A TIME) 30 g 1  . metoprolol succinate (TOPROL-XL) 100 MG 24 hr tablet Take 100 mg by mouth 2 (two) times daily. Take with or immediately following a meal.    . Multiple Vitamin (MULTIVITAMIN) tablet Take 1 tablet by mouth daily.      Marland Kitchen omeprazole (PRILOSEC) 20 MG capsule Take 1 capsule (20 mg total) by mouth daily. 30 capsule 3  . PARoxetine (PAXIL) 20 MG tablet Take 20 mg by mouth daily.     Vladimir Faster Glycol-Propyl Glycol (SYSTANE ULTRA) 0.4-0.3 % SOLN Place 1 drop into both eyes 3 (three) times daily.    . potassium chloride SA (K-DUR,KLOR-CON) 20 MEQ tablet Take 1 tablet (20 mEq total) by mouth 2 (two) times daily. 180 tablet 3  . simvastatin (ZOCOR) 40 MG tablet Take 40 mg daily by mouth.    . sodium chloride (OCEAN) 0.65 % SOLN nasal spray Place 1 spray into both nostrils as needed for congestion.    . traMADol (ULTRAM) 50 MG tablet Take 1 tablet (50 mg total) by mouth every 6 (six) hours as needed. 15 tablet 0  .  umeclidinium-vilanterol (ANORO ELLIPTA) 62.5-25 MCG/INH AEPB Inhale 1 puff into the lungs daily. 30 each 6  . warfarin (COUMADIN) 2.5 MG tablet Take 2 tablets daily except 1 1/2 tablets on Mondays, Wednesdays and Fridays (Patient taking differently: Take 2 tablets Saturday and Tuesday; 1 1/2 tablets all other days) 60 tablet 3   No current facility-administered medications for this visit.           Past Surgical History:  Procedure Laterality Date  . APPENDECTOMY    . BREAST SURGERY Right 2003ish  biopsy  . CARDIAC CATHETERIZATION N/A 04/12/2016   Procedure: Right/Left Heart Cath and Coronary Angiography;  Surgeon: Leonie Man, MD;  Location: [facility];  Service: Cardiovascular;  Laterality: N/A;  . CATARACT EXTRACTION Bilateral 2017  . CLIPPING OF ATRIAL APPENDAGE  07/05/2016   Procedure: CLIPPING OF ATRIAL APPENDAGE;  Surgeon: Rexene Alberts, MD;  Location: [facility];  Service: Open Heart Surgery;;  . COLONOSCOPY  11/09   Dr. Gala Romney- external hemorrhoidal tags o/w normal rectal mucosa, s/p surgical resection with a normal appearing anastomosis 12cm, pan colonic diverticulum  . COLONOSCOPY N/A 06/02/2012   ION:GEXBMW post low anterior resection. Pancolonic diverticulosis. Colonic polyp-tubular adenoma. Surveillance due 2019.   Marland Kitchen COLONOSCOPY N/A 10/13/2015   Procedure: COLONOSCOPY;  Surgeon: Daneil Dolin, MD;  [name];  Service: Endoscopy;  Laterality: N/A;  0930  . ESOPHAGOGASTRODUODENOSCOPY  07/2007   Dr. Daiva Nakayama cervical esophageal web, schatzki ring, large hiatal hernia  . INGUNAL HERNIA REPAIR Left   . LOW ANTERIOR BOWEL RESECTION     NO ADJ CHEMO  . MINIMALLY INVASIVE MAZE PROCEDURE N/A 07/05/2016   Procedure: MINIMALLY INVASIVE MAZE PROCEDURE;  Surgeon: Rexene Alberts, MD;  Location: [facility];  Service: Open Heart Surgery;  Laterality: N/A;  . MITRAL VALVE REPAIR Right 07/05/2016   Procedure: MINIMALLY INVASIVE MITRAL VALVE  REPAIR (MVR);  Surgeon: Rexene Alberts, MD;  Location: [facility];  Service: Open Heart Surgery;  Laterality: Right;  . MULTIPLE EXTRACTIONS WITH ALVEOLOPLASTY N/A 05/21/2016   Procedure: MULTIPLE EXTRACTION OF TOOTH #'S 5, 21 WITH ALVEOLOPLASTY AND GROSS DEBRIDEMENT OF TEETH;  Surgeon: Lenn Cal, DDS;  Location: [facility];  Service: Oral Surgery;  Laterality: N/A;  . TEE WITHOUT CARDIOVERSION N/A 02/20/2016   Procedure: TRANSESOPHAGEAL ECHOCARDIOGRAM (TEE);  Surgeon: Arnoldo Lenis, MD;  [name];  Service: Endoscopy;  Laterality: N/A;  . TEE WITHOUT CARDIOVERSION N/A 07/05/2016   Procedure: TRANSESOPHAGEAL ECHOCARDIOGRAM (TEE);  Surgeon: Rexene Alberts, MD;  Location: [facility];  Service: Open Heart Surgery;  Laterality: N/A;  . TOOTH EXTRACTION    . TUBAL LIGATION    . VEIN LIGATION AND STRIPPING            Allergies  Allergen Reactions  . Iohexol Hives and Shortness Of Breath     Pt. had a severe allergic reaction to IV contrast the last time she was injected and had to be seen in the ER.   Marland Kitchen Hydrocodone-Acetaminophen Hives  . Nabumetone Rash  . Prednisone Rash           Family History  Problem Relation Age of Onset  . Breast cancer Mother   . Rheum arthritis Father   . Cancer - Lung Sister   . Brain cancer Sister   . Prostate cancer Brother   . Aneurysm Brother   . Colon cancer Neg Hx      Social History Ms. Mckinzie reports that she has never smoked. She has never used smokeless tobacco. Ms. Beegle reports that she does not drink alcohol.   Review of Systems CONSTITUTIONAL: No weight loss, fever, chills, weakness or fatigue.  HEENT: Eyes: No visual loss, blurred vision, double vision or yellow sclerae.No hearing loss, sneezing, congestion, runny nose or sore throat.  SKIN: No rash or itching.  CARDIOVASCULAR: per hpi RESPIRATORY: No shortness of breath, cough or sputum.  GASTROINTESTINAL: No anorexia, nausea,  vomiting or diarrhea. No abdominal pain or blood.  GENITOURINARY: No burning on urination,  no polyuria NEUROLOGICAL: No headache, dizziness, syncope, paralysis, ataxia, numbness or tingling in the extremities. No change in bowel or bladder control.  MUSCULOSKELETAL: No muscle, back pain, joint pain or stiffness.  LYMPHATICS: No enlarged nodes. No history of splenectomy.  PSYCHIATRIC: No history of depression or anxiety.  ENDOCRINOLOGIC: No reports of sweating, cold or heat intolerance. No polyuria or polydipsia.  Marland Kitchen   Physical Examination    Vitals:   07/15/17 1346  BP: 127/84  Pulse: 79  SpO2: 95%      Vitals:   07/15/17 1346  Weight: 152 lb 12.8 oz (69.3 kg)  Height: 5\' 2"  (1.575 m)    Gen: resting comfortably, no acute distress HEENT: no scleral icterus, pupils equal round and reactive, no palptable cervical adenopathy,  CV: irreg, no m/r/g, no jvd Resp: Clear to auscultation bilaterally GI: abdomen is soft, non-tender, non-distended, normal bowel sounds, no hepatosplenomegaly MSK: extremities are warm, no edema.  Skin: warm, no rash Neuro:  no focal deficits Psych: appropriate affect   Diagnostic Studies 01/2016 echo Study Conclusions  - Left ventricle: The cavity size was normal. Wall thickness was normal. Systolic function was normal. The estimated ejection fraction was in the range of 60% to 65%. Wall motion was normal; there were no regional wall motion abnormalities. The study is not technically sufficient to allow evaluation of LV diastolic function. Doppler parameters are consistent with high ventricular filling pressure. - Mitral valve: Calcified annulus. There was moderate to severe (closer to severe) eccentric regurgitation. - Left atrium: The atrium was severely dilated. - Right atrium: The atrium was moderately dilated. - Tricuspid valve: There was moderate-severe regurgitation. - Pulmonary arteries: PA peak pressure: 40 mm Hg  (S).   01/2016 TEE eccentric moderate to severe MR. Moderate to severe TR. F/u full report for final findings.  08/22/16 echo  Study Conclusions  - Left ventricle: The cavity size was normal. Wall thickness was normal. Systolic function was normal. The estimated ejection fraction was in the range of 60% to 65%. Wall motion was normal; there were no regional wall motion abnormalities. The study is not technically sufficient to allow evaluation of LV diastolic function. - Aortic valve: Mildly calcified annulus. Trileaflet. Valve area (VTI): 1.6 cm^2. Valve area (Vmax): 1.59 cm^2. Valve area (Vmean): 1.61 cm^2. - Mitral valve: Status post complex valvuloplasty including artificial Gore-tex neochord placement and Sorin Memo 3D Ring Annuloplasty (size 62mm, catalog #[id number], serial [id number]). Mildly thickened leaflets. There was trivial regurgitation. Mean gradient (D): 4 mm Hg. - Left atrium: The atrium was mildly to moderately dilated. - Right ventricle: The cavity size was mildly dilated. - Right atrium: The atrium was at the upper limits of normal in size. Central venous pressure (est): 3 mm Hg. - Atrial septum: No defect or patent foramen ovale was identified. - Tricuspid valve: There was moderate regurgitation. - Pulmonary arteries: Systolic pressure was moderately increased. PA peak pressure: 50 mm Hg (S). - Pericardium, extracardiac: There was no pericardial effusion.  Impressions:  - Normal LV wall thickness with LVEF 60-65%. Indeterminate diastolic function. Mild to moderate left atrial enlargement. Status post mitral valve repair and annuloplasty as indicated with mildly thickened leaflets and trivial mitral regurgitation. Mean gradient 4 mmHg. Mild right ventricular enlargement. At least moderate tricuspid regurgitation noted. Moderate pulmonary hypertension with PASP 50 mmHg.   10/2016 echo Study Conclusions  - Left  ventricle: The cavity size was normal. Wall thickness was normal. Systolic function was normal. The estimated ejection fraction was in the  range of 60% to 65%. Wall motion was normal; there were no regional wall motion abnormalities. The study is not technically sufficient to allow evaluation of LV diastolic function. - Aortic valve: Moderately calcified annulus. Trileaflet. - Mitral valve: Status post complex valvuloplasty including artificial Gore-tex neochord placement and Sorin Memo 3D Ring Annuloplasty (size 30mm, catalog #[id number], serial [id number]). There was trivial regurgitation. Mean gradient (D): 4 mm Hg. - Left atrium: The atrium was mildly dilated. - Right ventricle: The cavity size was mildly dilated. Systolic function appears mildly reduced. - Right atrium: The atrium was mildly to moderately dilated. - Tricuspid valve: There was moderate regurgitation. - Pulmonary arteries: PA peak pressure: 43 mm Hg (S).      Assessment and Plan   1. Mitral regurgitation/Mitral valve repair - s/p mitral valve repair - no recent symptoms, continue current meds  2. AFib/Aflutter -recent issues with intermittent tachycardia, found to have atypical aflutter - she is on amio and CCB. Plans for DCCV were postponed due to subtherapeutic INRs, most recent INRs have been at goal - plan for DCCV - EKG today shows afib.    3. COPD - follow with Dr Luan Pulling  4. HTN - at goal, continue current meds    Arnoldo Lenis, M.D.

## 2017-08-16 NOTE — Anesthesia Postprocedure Evaluation (Signed)
Anesthesia Post Note  Patient: [name] [name]  Procedure(s) Performed: CARDIOVERSION (N/A )  Patient location during evaluation: PACU Anesthesia Type: MAC Level of consciousness: awake, oriented and patient cooperative Pain management: pain level controlled Vital Signs Assessment: post-procedure vital signs reviewed and stable Respiratory status: spontaneous breathing and respiratory function stable Cardiovascular status: stable Postop Assessment: no apparent nausea or vomiting Anesthetic complications: no     Last Vitals:  Vitals:   08/16/17 0837 08/16/17 1045  BP: 127/81 (!) 98/48  Pulse: 72   Temp: 36.6 C 36.6 C    Last Pain:  Vitals:   08/16/17 1045  TempSrc:   PainSc: (P) 0-No pain                 ADAMS, AMY A

## 2017-08-16 NOTE — Transfer of Care (Signed)
Immediate Anesthesia Transfer of Care Note  Patient: [name] [name]  Procedure(s) Performed: CARDIOVERSION (N/A )  Patient Location: PACU  Anesthesia Type:MAC  Level of Consciousness: awake, oriented and patient cooperative  Airway & Oxygen Therapy: Patient Spontanous Breathing and Patient connected to face mask oxygen  Post-op Assessment: Report given to RN and Post -op Vital signs reviewed and stable  Post vital signs: Reviewed and stable  Last Vitals:  Vitals Value Taken Time  BP    Temp    Pulse    Resp    SpO2      Last Pain:  Vitals:   08/16/17 0837  TempSrc: Oral  PainSc: 2       Patients Stated Pain Goal: 7 (79/03/83 3383)  Complications: No apparent anesthesia complications

## 2017-08-16 NOTE — Progress Notes (Signed)
Electrical Cardioversion Procedure Note [facility] [id number] 1940-10-07  Procedure: Electrical Cardioversion Indications:  Atrial Fibrillation  Procedure Details Consent: Risks of procedure as well as the alternatives and risks of each were explained to the (patient/caregiver).  Consent for procedure obtained. Time Out: Verified patient identification, verified procedure, site/side was marked, verified correct patient position, special equipment/implants available, medications/allergies/relevent history reviewed, required imaging and test results available.  Performed  Patient placed on cardiac monitor, pulse oximetry, supplemental oxygen as necessary.  Sedation given: propofol per A. ADams, CRNA Pacer pads placed anterior and posterior chest.  Cardioverted 1 time(s).  Cardioverted at [place].  Evaluation Findings: Post procedure EKG shows: sinus brady with rare PACs Complications: None Patient did tolerate procedure well.   Charm Barges S 08/16/2017, 11:40 AM

## 2017-08-16 NOTE — Discharge Instructions (Signed)
° °  PATIENT INSTRUCTIONS POST-ANESTHESIA  IMMEDIATELY FOLLOWING SURGERY:  Do not drive or operate machinery for the first twenty four hours after surgery.  Do not make any important decisions for twenty four hours after surgery or while taking narcotic pain medications or sedatives.  If you develop intractable nausea and vomiting or a severe headache please notify your doctor immediately.  FOLLOW-UP:  Please make an appointment with your surgeon as instructed. You do not need to follow up with anesthesia unless specifically instructed to do so.  WOUND CARE INSTRUCTIONS (if applicable):  Keep a dry clean dressing on the anesthesia/puncture wound site if there is drainage.  Once the wound has quit draining you may leave it open to air.  Generally you should leave the bandage intact for twenty four hours unless there is drainage.  If the epidural site drains for more than 36-48 hours please call the anesthesia department.  QUESTIONS?:  Please feel free to call your physician or the hospital operator if you have any questions, and they will be happy to assist you.          Electrical Cardioversion, Care After This sheet gives you information about how to care for yourself after your procedure. Your health care provider may also give you more specific instructions. If you have problems or questions, contact your health care provider. What can I expect after the procedure? After the procedure, it is common to have:  Some redness on the skin where the shocks were given.  Follow these instructions at home:  Do not drive for 24 hours if you were given a medicine to help you relax (sedative).  Take over-the-counter and prescription medicines only as told by your health care provider.  Ask your health care provider how to check your pulse. Check it often.  Rest for 48 hours after the procedure or as told by your health care provider.  Avoid or limit your caffeine use as told by your health care  provider. Contact a health care provider if:  You feel like your heart is beating too quickly or your pulse is not regular.  You have a serious muscle cramp that does not go away. Get help right away if:  You have discomfort in your chest.  You are dizzy or you feel faint.  You have trouble breathing or you are short of breath.  Your speech is slurred.  You have trouble moving an arm or leg on one side of your body.  Your fingers or toes turn cold or blue. This information is not intended to replace advice given to you by your health care provider. Make sure you discuss any questions you have with your health care provider. Document Released: 01/28/2013 Document Revised: 11/11/2015 Document Reviewed: 10/14/2015 Elsevier Interactive Patient Education  [employer].

## 2017-08-19 ENCOUNTER — Encounter (HOSPITAL_COMMUNITY): Payer: Self-pay | Admitting: Cardiology

## 2017-08-21 NOTE — Addendum Note (Signed)
Addendum  created 08/21/17 1117 by Vista Deck, CRNA   Charge Capture section accepted

## 2017-08-22 ENCOUNTER — Ambulatory Visit (INDEPENDENT_AMBULATORY_CARE_PROVIDER_SITE_OTHER): Payer: Medicare Other | Admitting: *Deleted

## 2017-08-22 ENCOUNTER — Encounter: Payer: Self-pay | Admitting: Internal Medicine

## 2017-08-22 ENCOUNTER — Ambulatory Visit (INDEPENDENT_AMBULATORY_CARE_PROVIDER_SITE_OTHER): Payer: Medicare Other | Admitting: Internal Medicine

## 2017-08-22 VITALS — BP 120/70 | HR 44 | Ht 62.0 in | Wt 148.6 lb

## 2017-08-22 DIAGNOSIS — I481 Persistent atrial fibrillation: Secondary | ICD-10-CM

## 2017-08-22 DIAGNOSIS — I480 Paroxysmal atrial fibrillation: Secondary | ICD-10-CM

## 2017-08-22 DIAGNOSIS — I495 Sick sinus syndrome: Secondary | ICD-10-CM

## 2017-08-22 DIAGNOSIS — Z79899 Other long term (current) drug therapy: Secondary | ICD-10-CM | POA: Diagnosis not present

## 2017-08-22 DIAGNOSIS — Z9889 Other specified postprocedural states: Secondary | ICD-10-CM | POA: Diagnosis not present

## 2017-08-22 DIAGNOSIS — R001 Bradycardia, unspecified: Secondary | ICD-10-CM | POA: Diagnosis not present

## 2017-08-22 DIAGNOSIS — I4819 Other persistent atrial fibrillation: Secondary | ICD-10-CM

## 2017-08-22 DIAGNOSIS — Z5181 Encounter for therapeutic drug level monitoring: Secondary | ICD-10-CM

## 2017-08-22 LAB — PROTIME-INR: INR: 11.3 — AB (ref 0.9–1.1)

## 2017-08-22 NOTE — Progress Notes (Signed)
HPI [name] [name] returns today for followup of mitral regurge, s/p repair, MAZE, PAF, sinus node dysfunction and HTN. The patient has been feeling dizzy. She has not had syncope, chest pain or sob. No edema. She is fairly sedentary. She does note a decrease in energy. Allergies  Allergen Reactions  . Iohexol Hives and Shortness Of Breath     Pt. had a severe allergic reaction to IV contrast the last time she was injected and had to be seen in the ER.   Marland Kitchen Hydrocodone-Acetaminophen Hives  . Nabumetone Rash  . Prednisone Rash     Current Outpatient Medications  Medication Sig Dispense Refill  . acetaminophen (TYLENOL) 500 MG tablet Take 1,000 mg by mouth 2 (two) times daily as needed for headache.    . albuterol (PROVENTIL HFA;VENTOLIN HFA) 108 (90 BASE) MCG/ACT inhaler Inhale 2 puffs into the lungs every 6 (six) hours as needed for wheezing.    Marland Kitchen albuterol (PROVENTIL) (2.5 MG/3ML) 0.083% nebulizer solution Inhale 3 mLs into the lungs at bedtime. May inhale a second dose during the day as needed for shortness of breath  12  . ALPRAZolam (XANAX) 0.5 MG tablet Take 0.5 mg by mouth 2 (two) times daily as needed (for anxiety/sleep (scheduled at bedtime)).     Marland Kitchen amiodarone (PACERONE) 200 MG tablet Take 1 tablet (200 mg total) by mouth daily. 90 tablet 3  . aspirin EC 81 MG EC tablet Take 1 tablet (81 mg total) by mouth daily.    . calcium carbonate (OS-CAL) 600 MG TABS tablet Take 600 mg by mouth 2 (two) times daily with a meal.    . Calcium Carbonate-Vitamin D 600-400 MG-UNIT tablet Take 1 tablet by mouth 2 (two) times daily.    . Coenzyme Q10 100 MG TABS Take 100 mg by mouth every evening.     . diltiazem (CARDIZEM) 30 MG tablet Take 1 tablet (30 mg total) by mouth 2 (two) times daily as needed (palpitations). 60 tablet 6  . docusate sodium (COLACE) 100 MG capsule Take 200 mg by mouth daily as needed for mild constipation.     . furosemide (LASIX) 40 MG tablet Take 40 mg by mouth 2  (two) times daily as needed.    . hydrocortisone (ANUSOL-HC) 2.5 % rectal cream Place 1 application rectally 2 (two) times daily. FOR UP TO 10 DAYS AT A TIME (Patient taking differently: Place 1 application rectally 2 (two) times daily as needed for hemorrhoids or itching. FOR UP TO 10 DAYS AT A TIME) 30 g 1  . metoprolol succinate (TOPROL-XL) 50 MG 24 hr tablet Take 1 tablet (50 mg total) by mouth daily. Take with or immediately following a meal. 30 tablet 11  . Multiple Vitamin (MULTIVITAMIN) tablet Take 1 tablet by mouth daily.      Marland Kitchen omeprazole (PRILOSEC) 20 MG capsule Take 1 capsule (20 mg total) by mouth daily. 30 capsule 3  . PARoxetine (PAXIL) 20 MG tablet Take 20 mg by mouth daily.     Vladimir Faster Glycol-Propyl Glycol (SYSTANE ULTRA) 0.4-0.3 % SOLN Place 1 drop into both eyes daily as needed (dry eyes).     . potassium chloride SA (K-DUR,KLOR-CON) 20 MEQ tablet Take 1 tablet (20 mEq total) by mouth 2 (two) times daily. 180 tablet 3  . simvastatin (ZOCOR) 40 MG tablet Take 40 mg daily by mouth.    . sodium chloride (OCEAN) 0.65 % SOLN nasal spray Place 1 spray into both nostrils as  needed for congestion.    . traMADol (ULTRAM) 50 MG tablet Take 1 tablet (50 mg total) by mouth every 6 (six) hours as needed. (Patient taking differently: Take 50 mg by mouth every 6 (six) hours as needed for moderate pain. ) 15 tablet 0  . warfarin (COUMADIN) 2.5 MG tablet Take 1 tablet daily except 1 1/2 tablets on Sundays and Thursdays or as directed (Patient taking differently: Take 2.5 mgt daily around 6 pm except on Sundays, Tuesdays, and Thursdays take 3.75 mg once daily) 100 tablet 3   No current facility-administered medications for this visit.      Past Medical History:  Diagnosis Date  . Anxiety   . Arthritis   . Asthma   . Atrial fibrillation, persistent ([facility])   . Chronic diastolic congestive heart failure ([place])   . Colon adenoma   . Colon cancer ([facility])    status post low anterior resection,  limited stage disease requiring no adjuvant therapy  . COPD (chronic obstructive pulmonary disease) ([facility])   . Depression   . Diverticulosis   . DM (dermatomyositis)   . DVT (deep venous thrombosis) ([name])    in leg- long time ago  . Dyspnea    with activity  . Dysrhythmia   . Esophageal dysphagia   . GERD (gastroesophageal reflux disease)   . Headache   . Heart murmur   . Hematuria   . Hemorrhoids   . Hiatal hernia   . History of kidney stones    x 2  . Hypercholesterolemia   . Incidental pulmonary nodule 05/08/2016   8 mm vague opacity RML noted on CT scan  . Mitral regurgitation   . PONV (postoperative nausea and vomiting) 2003 ish    with breast biopsy  . S/P minimally invasive maze operation for atrial fibrillation 07/05/2016   Complete bilateral atrial lesion set using cryothermy and bipolar radiofrequency ablation with clipping of LA appendage via right mini thoracotomy approach  . 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 Schatzki's ring   . Tricuspid regurgitation     ROS:   All systems reviewed and negative except as noted in the HPI.   Past Surgical History:  Procedure Laterality Date  . APPENDECTOMY    . BREAST SURGERY Right 2003ish   biopsy  . CARDIAC CATHETERIZATION N/A 04/12/2016   Procedure: Right/Left Heart Cath and Coronary Angiography;  Surgeon: Leonie Man, MD;  Location: [facility];  Service: Cardiovascular;  Laterality: N/A;  . CARDIOVERSION N/A 08/16/2017   Procedure: CARDIOVERSION;  Surgeon: Arnoldo Lenis, MD;  Location: [name];  Service: Endoscopy;  Laterality: N/A;  . CATARACT EXTRACTION Bilateral 2017  . CLIPPING OF ATRIAL APPENDAGE  07/05/2016   Procedure: CLIPPING OF ATRIAL APPENDAGE;  Surgeon: Rexene Alberts, MD;  Location: [facility];  Service: Open Heart Surgery;;    . COLONOSCOPY  11/09   Dr. Gala Romney- external hemorrhoidal tags o/w normal rectal mucosa, s/p surgical resection with a normal appearing anastomosis 12cm, pan colonic diverticulum  . COLONOSCOPY N/A 06/02/2012   YSA:[id number] post low anterior resection. Pancolonic diverticulosis. Colonic polyp-tubular adenoma. Surveillance due 2019.   Marland Kitchen COLONOSCOPY N/A 10/13/2015   Procedure: COLONOSCOPY;  Surgeon: Daneil Dolin, MD;  [name];  Service: Endoscopy;  Laterality: N/A;  0930  . ESOPHAGOGASTRODUODENOSCOPY  07/2007   Dr. Daiva Nakayama  cervical esophageal web, schatzki ring, large hiatal hernia  . INGUNAL HERNIA REPAIR Left   . LOW ANTERIOR BOWEL RESECTION     NO ADJ CHEMO  . MINIMALLY INVASIVE MAZE PROCEDURE N/A 07/05/2016   Procedure: MINIMALLY INVASIVE MAZE PROCEDURE;  Surgeon: Rexene Alberts, MD;  Location: [facility];  Service: Open Heart Surgery;  Laterality: N/A;  . MITRAL VALVE REPAIR Right 07/05/2016   Procedure: MINIMALLY INVASIVE MITRAL VALVE REPAIR (MVR);  Surgeon: Rexene Alberts, MD;  Location: [facility];  Service: Open Heart Surgery;  Laterality: Right;  . MULTIPLE EXTRACTIONS WITH ALVEOLOPLASTY N/A 05/21/2016   Procedure: MULTIPLE EXTRACTION OF TOOTH #'S 5, 21 WITH ALVEOLOPLASTY AND GROSS DEBRIDEMENT OF TEETH;  Surgeon: Lenn Cal, DDS;  Location: [facility];  Service: Oral Surgery;  Laterality: N/A;  . TEE WITHOUT CARDIOVERSION N/A 02/20/2016   Procedure: TRANSESOPHAGEAL ECHOCARDIOGRAM (TEE);  Surgeon: Arnoldo Lenis, MD;  [name];  Service: Endoscopy;  Laterality: N/A;  . TEE WITHOUT CARDIOVERSION N/A 07/05/2016   Procedure: TRANSESOPHAGEAL ECHOCARDIOGRAM (TEE);  Surgeon: Rexene Alberts, MD;  Location: [facility];  Service: Open Heart Surgery;  Laterality: N/A;  . TOOTH EXTRACTION    . TUBAL LIGATION    . VEIN LIGATION AND STRIPPING       Family History  Problem Relation Age of Onset  . Breast cancer Mother   . Rheum arthritis Father   . Cancer - Lung Sister    . Brain cancer Sister   . Prostate cancer Brother   . Aneurysm Brother   . Colon cancer Neg Hx      Social History   Socioeconomic History  . Marital status: Married    Spouse name: Not on file  . Number of children: 2  . Years of education: Not on file  . Highest education level: Not on file  Occupational History  . Not on file  Social Needs  . Financial resource strain: Not on file  . Food insecurity:    Worry: Not on file    Inability: Not on file  . Transportation needs:    Medical: Not on file    Non-medical: Not on file  Tobacco Use  . Smoking status: Never Smoker  . Smokeless tobacco: Never Used  Substance and Sexual Activity  . Alcohol use: No    Alcohol/week: 0.0 oz    Comment: Rarely  . Drug use: No  . Sexual activity: Not on file  Lifestyle  . Physical activity:    Days per week: Not on file    Minutes per session: Not on file  . Stress: Not on file  Relationships  . Social connections:    Talks on phone: Not on file    Gets together: Not on file    Attends religious service: Not on file    Active member of club or organization: Not on file    Attends meetings of clubs or organizations: Not on file    Relationship status: Not on file  . Intimate partner violence:    Fear of current or ex partner: Not on file    Emotionally abused: Not on file    Physically abused: Not on file    Forced sexual activity: Not on file  Other Topics Concern  . Not on file  Social History Narrative  . Not on file     BP 120/70   Pulse (!) 44   Ht 5\' 2"  (1.575 m)   Wt 148 lb 9.6  oz (67.4 kg)   SpO2 99% Comment: on room air  BMI 27.18 kg/m   Physical Exam:  stable appearing 77 yo woman, NAD HEENT: Unremarkable Neck:  6 cm JVD, no thyromegally Lymphatics:  No adenopathy Back:  No CVA tenderness Lungs:  Clear with no wheezes HEART:  Regular brady rhythm, no murmurs, no rubs, no clicks Abd:  soft, positive bowel sounds, no organomegally, no rebound, no  guarding Ext:  2 plus pulses, no edema, no cyanosis, no clubbing Skin:  No rashes no nodules Neuro:  CN II through XII intact, motor grossly intact  EKG sinus bradycardia  DEVICE  Normal device function.  See PaceArt for details.   Assess/Plan: 1. Sinus node dysfunction - she appears to be symptomatic but we have not correlated her symptoms with severe bradycardia. I have recommended she undergo a 24 hour holter. 2. PAF - she appears to be maintaining NSR on amiodarone.  3. HTN - her blood pressure is reasonably well controlled. Will follow. 4. MR - she is s/p surgery and repeat echo shows minimal regurgitation.  Mikle Bosworth.D.

## 2017-08-22 NOTE — Patient Instructions (Signed)
Medication Instructions:  Your physician recommends that you continue on your current medications as directed. Please refer to the Current Medication list given to you today.   Labwork: NONE   Testing/Procedures: Your physician has recommended that you wear a 24 hour holter monitor. Holter monitors are medical devices that record the heart's electrical activity. Doctors most often use these monitors to diagnose arrhythmias. Arrhythmias are problems with the speed or rhythm of the heartbeat. The monitor is a small, portable device. You can wear one while you do your normal daily activities. This is usually used to diagnose what is causing palpitations/syncope (passing out).    Follow-Up: Your physician recommends that you schedule a follow-up appointment depending on test results.    Any Other Special Instructions Will Be Listed Below (If Applicable).     If you need a refill on your cardiac medications before your next appointment, please call your pharmacy. Thank you for choosing [facility]!

## 2017-08-26 ENCOUNTER — Ambulatory Visit (HOSPITAL_COMMUNITY)
Admission: RE | Admit: 2017-08-26 | Discharge: 2017-08-26 | Disposition: A | Discharge status: Home / Self Care | Payer: Medicare Other | Source: Ambulatory Visit | Attending: Internal Medicine | Admitting: Internal Medicine

## 2017-08-26 DIAGNOSIS — R001 Bradycardia, unspecified: Secondary | ICD-10-CM | POA: Insufficient documentation

## 2017-08-29 ENCOUNTER — Ambulatory Visit (INDEPENDENT_AMBULATORY_CARE_PROVIDER_SITE_OTHER): Payer: Medicare Other | Admitting: *Deleted

## 2017-08-29 DIAGNOSIS — I4819 Other persistent atrial fibrillation: Secondary | ICD-10-CM

## 2017-08-29 DIAGNOSIS — I481 Persistent atrial fibrillation: Secondary | ICD-10-CM | POA: Diagnosis not present

## 2017-08-29 DIAGNOSIS — Z5181 Encounter for therapeutic drug level monitoring: Secondary | ICD-10-CM | POA: Diagnosis not present

## 2017-08-29 LAB — POCT INR: INR: 1.5

## 2017-08-29 NOTE — Patient Instructions (Signed)
Restart coumadin 1 tablet daily except 1 1/2 tablets on Sundays, Tuesdays and Thursdays Recheck in 1 week

## 2017-09-05 ENCOUNTER — Ambulatory Visit (INDEPENDENT_AMBULATORY_CARE_PROVIDER_SITE_OTHER): Payer: Medicare Other | Admitting: *Deleted

## 2017-09-05 DIAGNOSIS — Z9889 Other specified postprocedural states: Secondary | ICD-10-CM | POA: Diagnosis not present

## 2017-09-05 DIAGNOSIS — I4819 Other persistent atrial fibrillation: Secondary | ICD-10-CM

## 2017-09-05 DIAGNOSIS — I481 Persistent atrial fibrillation: Secondary | ICD-10-CM

## 2017-09-05 DIAGNOSIS — Z5181 Encounter for therapeutic drug level monitoring: Secondary | ICD-10-CM | POA: Diagnosis not present

## 2017-09-05 LAB — POCT INR: INR: 3.2

## 2017-09-05 NOTE — Patient Instructions (Signed)
Continue coumadin 1 tablet daily except 1 1/2 tablets on Sundays, Tuesdays and Thursdays Recheck in 1 week

## 2017-09-12 ENCOUNTER — Ambulatory Visit (INDEPENDENT_AMBULATORY_CARE_PROVIDER_SITE_OTHER): Payer: Medicare Other | Admitting: *Deleted

## 2017-09-12 DIAGNOSIS — Z5181 Encounter for therapeutic drug level monitoring: Secondary | ICD-10-CM | POA: Diagnosis not present

## 2017-09-12 DIAGNOSIS — Z9889 Other specified postprocedural states: Secondary | ICD-10-CM

## 2017-09-12 DIAGNOSIS — I481 Persistent atrial fibrillation: Secondary | ICD-10-CM | POA: Diagnosis not present

## 2017-09-12 DIAGNOSIS — I4819 Other persistent atrial fibrillation: Secondary | ICD-10-CM

## 2017-09-12 LAB — POCT INR: INR: 4.2 — AB (ref 2.0–3.0)

## 2017-09-12 NOTE — Patient Instructions (Addendum)
Take coumadin 1/2 tablet tonight then decrease dose to 1 tablet daily except 1 1/2 tablets on Sundays and Thursdays Recheck in 2 weeks

## 2017-09-13 DIAGNOSIS — M159 Polyosteoarthritis, unspecified: Secondary | ICD-10-CM | POA: Diagnosis not present

## 2017-09-13 DIAGNOSIS — E7800 Pure hypercholesterolemia, unspecified: Secondary | ICD-10-CM | POA: Diagnosis not present

## 2017-09-13 DIAGNOSIS — J449 Chronic obstructive pulmonary disease, unspecified: Secondary | ICD-10-CM | POA: Diagnosis not present

## 2017-09-13 DIAGNOSIS — I4891 Unspecified atrial fibrillation: Secondary | ICD-10-CM | POA: Diagnosis not present

## 2017-09-17 ENCOUNTER — Encounter: Payer: Self-pay | Admitting: "Endocrinology

## 2017-09-17 DIAGNOSIS — D509 Iron deficiency anemia, unspecified: Secondary | ICD-10-CM | POA: Diagnosis not present

## 2017-09-17 DIAGNOSIS — J449 Chronic obstructive pulmonary disease, unspecified: Secondary | ICD-10-CM | POA: Diagnosis not present

## 2017-09-17 DIAGNOSIS — R195 Other fecal abnormalities: Secondary | ICD-10-CM | POA: Diagnosis not present

## 2017-09-17 DIAGNOSIS — Z6824 Body mass index (BMI) 24.0-24.9, adult: Secondary | ICD-10-CM | POA: Diagnosis not present

## 2017-09-17 DIAGNOSIS — Z299 Encounter for prophylactic measures, unspecified: Secondary | ICD-10-CM | POA: Diagnosis not present

## 2017-09-17 DIAGNOSIS — R531 Weakness: Secondary | ICD-10-CM | POA: Diagnosis not present

## 2017-09-17 DIAGNOSIS — I4891 Unspecified atrial fibrillation: Secondary | ICD-10-CM | POA: Diagnosis not present

## 2017-09-19 DIAGNOSIS — R05 Cough: Secondary | ICD-10-CM | POA: Diagnosis not present

## 2017-09-19 DIAGNOSIS — H9203 Otalgia, bilateral: Secondary | ICD-10-CM | POA: Diagnosis not present

## 2017-09-19 DIAGNOSIS — R001 Bradycardia, unspecified: Secondary | ICD-10-CM | POA: Diagnosis not present

## 2017-09-19 DIAGNOSIS — H66003 Acute suppurative otitis media without spontaneous rupture of ear drum, bilateral: Secondary | ICD-10-CM | POA: Diagnosis not present

## 2017-09-19 DIAGNOSIS — Z79899 Other long term (current) drug therapy: Secondary | ICD-10-CM | POA: Diagnosis not present

## 2017-09-19 DIAGNOSIS — J45909 Unspecified asthma, uncomplicated: Secondary | ICD-10-CM | POA: Diagnosis not present

## 2017-09-19 DIAGNOSIS — Z7901 Long term (current) use of anticoagulants: Secondary | ICD-10-CM | POA: Diagnosis not present

## 2017-09-19 DIAGNOSIS — Z85038 Personal history of other malignant neoplasm of large intestine: Secondary | ICD-10-CM | POA: Diagnosis not present

## 2017-09-19 DIAGNOSIS — R9431 Abnormal electrocardiogram [ECG] [EKG]: Secondary | ICD-10-CM | POA: Diagnosis not present

## 2017-09-19 DIAGNOSIS — Z9049 Acquired absence of other specified parts of digestive tract: Secondary | ICD-10-CM | POA: Diagnosis not present

## 2017-09-19 DIAGNOSIS — I4510 Unspecified right bundle-branch block: Secondary | ICD-10-CM | POA: Diagnosis not present

## 2017-09-20 ENCOUNTER — Telehealth: Payer: Self-pay | Admitting: Internal Medicine

## 2017-09-20 NOTE — Telephone Encounter (Signed)
Pt had a 24 hr holter and was wondering when she's supposed to follow up w/ the Dr.

## 2017-09-23 ENCOUNTER — Telehealth: Payer: Self-pay | Admitting: *Deleted

## 2017-09-23 NOTE — Telephone Encounter (Signed)
Patient called asking when she is due back to get CCR checked

## 2017-09-23 NOTE — Telephone Encounter (Signed)
Informed pt of appt for INR check on 09/26/17.  She verbalized understanding.

## 2017-09-26 ENCOUNTER — Ambulatory Visit (INDEPENDENT_AMBULATORY_CARE_PROVIDER_SITE_OTHER): Payer: Medicare Other | Admitting: *Deleted

## 2017-09-26 DIAGNOSIS — Z9889 Other specified postprocedural states: Secondary | ICD-10-CM | POA: Diagnosis not present

## 2017-09-26 DIAGNOSIS — I4819 Other persistent atrial fibrillation: Secondary | ICD-10-CM

## 2017-09-26 DIAGNOSIS — I481 Persistent atrial fibrillation: Secondary | ICD-10-CM

## 2017-09-26 DIAGNOSIS — Z5181 Encounter for therapeutic drug level monitoring: Secondary | ICD-10-CM | POA: Diagnosis not present

## 2017-09-26 LAB — POCT INR: INR: 4.9 — AB (ref 2.0–3.0)

## 2017-09-26 NOTE — Patient Instructions (Signed)
Hold coumadin tonight then decrease dose to 1 tablet daily Recheck in 2 weeks

## 2017-10-10 ENCOUNTER — Ambulatory Visit (INDEPENDENT_AMBULATORY_CARE_PROVIDER_SITE_OTHER): Payer: Medicare Other | Admitting: *Deleted

## 2017-10-10 DIAGNOSIS — I4819 Other persistent atrial fibrillation: Secondary | ICD-10-CM

## 2017-10-10 DIAGNOSIS — Z9889 Other specified postprocedural states: Secondary | ICD-10-CM | POA: Diagnosis not present

## 2017-10-10 DIAGNOSIS — Z5181 Encounter for therapeutic drug level monitoring: Secondary | ICD-10-CM

## 2017-10-10 DIAGNOSIS — I481 Persistent atrial fibrillation: Secondary | ICD-10-CM | POA: Diagnosis not present

## 2017-10-10 LAB — POCT INR: INR: 5.4 — AB (ref 2.0–3.0)

## 2017-10-10 NOTE — Patient Instructions (Addendum)
Hold coumadin tonight and tomorrow night then decrease dose to 1 tablet daily except 1/2 tablet on Sundays, Tuesdays and Thursdays Bleeding and fall precautions discussed with pt and she verbalized understanding Recheck in 1 weeks

## 2017-10-11 DIAGNOSIS — Z7901 Long term (current) use of anticoagulants: Secondary | ICD-10-CM | POA: Diagnosis not present

## 2017-10-11 DIAGNOSIS — R188 Other ascites: Secondary | ICD-10-CM | POA: Diagnosis not present

## 2017-10-11 DIAGNOSIS — Z91041 Radiographic dye allergy status: Secondary | ICD-10-CM | POA: Diagnosis not present

## 2017-10-11 DIAGNOSIS — E039 Hypothyroidism, unspecified: Secondary | ICD-10-CM | POA: Diagnosis present

## 2017-10-11 DIAGNOSIS — J441 Chronic obstructive pulmonary disease with (acute) exacerbation: Secondary | ICD-10-CM | POA: Diagnosis present

## 2017-10-11 DIAGNOSIS — K7460 Unspecified cirrhosis of liver: Secondary | ICD-10-CM | POA: Diagnosis present

## 2017-10-11 DIAGNOSIS — R1900 Intra-abdominal and pelvic swelling, mass and lump, unspecified site: Secondary | ICD-10-CM | POA: Diagnosis not present

## 2017-10-11 DIAGNOSIS — Z888 Allergy status to other drugs, medicaments and biological substances status: Secondary | ICD-10-CM | POA: Diagnosis not present

## 2017-10-11 DIAGNOSIS — R0602 Shortness of breath: Secondary | ICD-10-CM | POA: Diagnosis not present

## 2017-10-11 DIAGNOSIS — I4891 Unspecified atrial fibrillation: Secondary | ICD-10-CM | POA: Diagnosis present

## 2017-10-11 DIAGNOSIS — I5022 Chronic systolic (congestive) heart failure: Secondary | ICD-10-CM | POA: Diagnosis present

## 2017-10-11 DIAGNOSIS — N183 Chronic kidney disease, stage 3 (moderate): Secondary | ICD-10-CM | POA: Diagnosis present

## 2017-10-11 DIAGNOSIS — R001 Bradycardia, unspecified: Secondary | ICD-10-CM | POA: Diagnosis present

## 2017-10-15 ENCOUNTER — Telehealth: Payer: Self-pay | Admitting: Pharmacist

## 2017-10-15 NOTE — Telephone Encounter (Signed)
Pt was admitted on Thursday and was discharged today. She was started on Cefuroxime since discharged today.  Synthroid started on Saturday in the hospital. Advised to continue warfarin as prescribed and keep INR check as scheduled on Thursday. Pt states understanding.

## 2017-10-16 ENCOUNTER — Ambulatory Visit: Payer: Medicare Other | Admitting: Nurse Practitioner

## 2017-10-16 ENCOUNTER — Telehealth: Payer: Self-pay | Admitting: *Deleted

## 2017-10-16 NOTE — Telephone Encounter (Signed)
Pt notified of OV on 11/12/17 @ 8:45. Pt voiced understanding.

## 2017-10-16 NOTE — Telephone Encounter (Signed)
-----   Message from Damian Leavell, RN sent at 10/01/2017  5:29 PM EDT ----- Regarding: RE: 24 holter So Dr. Lovena Le wants to see pt back in [place] to discuss pacemaker. He is going to decide if he wants to pick up some days in July so he can see her. If you just want to let her know that her heart rate is too slow at times. Will that work? And let her know we will get her scheduled soon.  Sonia Baller ----- Message ----- From: Levonne Hubert, LPN Sent: 2/37/6283   4:59 PM To: Damian Leavell, RN Subject: RE: 24 holter                                  Will you have Dr. Darene Lamer  review result and give patient instructions ----- Message ----- From: Damian Leavell, RN Sent: 09/25/2017   7:36 AM To: Marikay Alar Pinnix, LPN Subject: FW: 24 holter                                  I asked Dr. Lovena Le to dictate on this for you yesterday.   I don't know if he did. Sonia Baller ----- Message ----- From: Bernita Raisin, RN Sent: 09/24/2017   2:29 PM To: Damian Leavell, RN Subject: RE: 24 holter                                  TY again, reply back to Alda Berthold ----- Message ----- From: Damian Leavell, RN Sent: 09/24/2017   2:05 PM To: Bernita Raisin, RN Subject: RE: 24 holter                                  I'll ask him to note on this today. Sonia Baller ----- Message ----- From: Bernita Raisin, RN Sent: 09/23/2017   4:03 PM To: Evans Lance, MD, Damian Leavell, RN Subject: Melton Alar: 24 holter                                    ----- Message ----- From: Arnoldo Lenis, MD Sent: 09/23/2017   1:18 PM To: Drema Dallas, CMA Subject: RE: 24 holter                                  Lovena Le ordered and would need to result  JB ----- Message ----- From: Drema Dallas, CMA Sent: 09/20/2017   3:22 PM To: Arnoldo Lenis, MD Subject: 24 holter                                      Can you result this so I can call pt. (when you have time)   Thank you, [name]

## 2017-10-17 ENCOUNTER — Encounter: Payer: Self-pay | Admitting: Internal Medicine

## 2017-10-17 ENCOUNTER — Ambulatory Visit (INDEPENDENT_AMBULATORY_CARE_PROVIDER_SITE_OTHER): Payer: Medicare Other | Admitting: *Deleted

## 2017-10-17 DIAGNOSIS — Z9889 Other specified postprocedural states: Secondary | ICD-10-CM | POA: Diagnosis not present

## 2017-10-17 DIAGNOSIS — I4819 Other persistent atrial fibrillation: Secondary | ICD-10-CM

## 2017-10-17 DIAGNOSIS — Z5181 Encounter for therapeutic drug level monitoring: Secondary | ICD-10-CM | POA: Diagnosis not present

## 2017-10-17 DIAGNOSIS — I481 Persistent atrial fibrillation: Secondary | ICD-10-CM | POA: Diagnosis not present

## 2017-10-17 LAB — POCT INR: INR: 1.4 — AB (ref 2.0–3.0)

## 2017-10-17 NOTE — Patient Instructions (Signed)
Starting on Cefuroxime 500mg  bid x 7 days and Synthroid 72mcg daily today. Has not filled Rx yet. Increase coumadin to 1 tablet daily except 1/2 tablet on Sundays and Wednesdays Recheck in 1 week

## 2017-10-18 DIAGNOSIS — H1859 Other hereditary corneal dystrophies: Secondary | ICD-10-CM | POA: Diagnosis not present

## 2017-10-18 DIAGNOSIS — Z961 Presence of intraocular lens: Secondary | ICD-10-CM | POA: Diagnosis not present

## 2017-10-18 DIAGNOSIS — H26493 Other secondary cataract, bilateral: Secondary | ICD-10-CM | POA: Diagnosis not present

## 2017-10-18 DIAGNOSIS — H1849 Other corneal degeneration: Secondary | ICD-10-CM | POA: Diagnosis not present

## 2017-10-18 DIAGNOSIS — H40013 Open angle with borderline findings, low risk, bilateral: Secondary | ICD-10-CM | POA: Diagnosis not present

## 2017-10-23 DIAGNOSIS — N183 Chronic kidney disease, stage 3 (moderate): Secondary | ICD-10-CM | POA: Diagnosis not present

## 2017-10-23 DIAGNOSIS — R6889 Other general symptoms and signs: Secondary | ICD-10-CM | POA: Diagnosis not present

## 2017-10-23 DIAGNOSIS — R600 Localized edema: Secondary | ICD-10-CM | POA: Diagnosis not present

## 2017-10-23 DIAGNOSIS — E039 Hypothyroidism, unspecified: Secondary | ICD-10-CM | POA: Diagnosis not present

## 2017-10-23 DIAGNOSIS — I340 Nonrheumatic mitral (valve) insufficiency: Secondary | ICD-10-CM | POA: Diagnosis not present

## 2017-10-23 DIAGNOSIS — Z6823 Body mass index (BMI) 23.0-23.9, adult: Secondary | ICD-10-CM | POA: Diagnosis not present

## 2017-10-23 DIAGNOSIS — J449 Chronic obstructive pulmonary disease, unspecified: Secondary | ICD-10-CM | POA: Diagnosis not present

## 2017-10-23 DIAGNOSIS — Z299 Encounter for prophylactic measures, unspecified: Secondary | ICD-10-CM | POA: Diagnosis not present

## 2017-10-23 DIAGNOSIS — I4891 Unspecified atrial fibrillation: Secondary | ICD-10-CM | POA: Diagnosis not present

## 2017-10-25 ENCOUNTER — Ambulatory Visit (INDEPENDENT_AMBULATORY_CARE_PROVIDER_SITE_OTHER): Payer: Medicare Other | Admitting: *Deleted

## 2017-10-25 DIAGNOSIS — I481 Persistent atrial fibrillation: Secondary | ICD-10-CM | POA: Diagnosis not present

## 2017-10-25 DIAGNOSIS — Z5181 Encounter for therapeutic drug level monitoring: Secondary | ICD-10-CM | POA: Diagnosis not present

## 2017-10-25 DIAGNOSIS — Z9889 Other specified postprocedural states: Secondary | ICD-10-CM | POA: Diagnosis not present

## 2017-10-25 DIAGNOSIS — I4819 Other persistent atrial fibrillation: Secondary | ICD-10-CM

## 2017-10-25 LAB — POCT INR: INR: 2.3 (ref 2.0–3.0)

## 2017-10-25 NOTE — Patient Instructions (Signed)
Take 1 1/2 tablets tonight then increase dose to 1 tablet daily except 1/2 tablet on Wednesdays Recheck in 2 weeks

## 2017-10-27 ENCOUNTER — Other Ambulatory Visit: Payer: Self-pay | Admitting: Cardiology

## 2017-10-30 DIAGNOSIS — R350 Frequency of micturition: Secondary | ICD-10-CM | POA: Diagnosis not present

## 2017-10-30 DIAGNOSIS — Z1339 Encounter for screening examination for other mental health and behavioral disorders: Secondary | ICD-10-CM | POA: Diagnosis not present

## 2017-10-30 DIAGNOSIS — E7800 Pure hypercholesterolemia, unspecified: Secondary | ICD-10-CM | POA: Diagnosis not present

## 2017-10-30 DIAGNOSIS — Z0000 Encounter for general adult medical examination without abnormal findings: Secondary | ICD-10-CM | POA: Diagnosis not present

## 2017-10-30 DIAGNOSIS — Z7189 Other specified counseling: Secondary | ICD-10-CM | POA: Diagnosis not present

## 2017-10-30 DIAGNOSIS — Z79899 Other long term (current) drug therapy: Secondary | ICD-10-CM | POA: Diagnosis not present

## 2017-10-30 DIAGNOSIS — Z1331 Encounter for screening for depression: Secondary | ICD-10-CM | POA: Diagnosis not present

## 2017-10-30 DIAGNOSIS — Z6823 Body mass index (BMI) 23.0-23.9, adult: Secondary | ICD-10-CM | POA: Diagnosis not present

## 2017-10-30 DIAGNOSIS — E039 Hypothyroidism, unspecified: Secondary | ICD-10-CM | POA: Diagnosis not present

## 2017-10-30 DIAGNOSIS — Z1211 Encounter for screening for malignant neoplasm of colon: Secondary | ICD-10-CM | POA: Diagnosis not present

## 2017-10-30 DIAGNOSIS — Z299 Encounter for prophylactic measures, unspecified: Secondary | ICD-10-CM | POA: Diagnosis not present

## 2017-10-30 DIAGNOSIS — R5383 Other fatigue: Secondary | ICD-10-CM | POA: Diagnosis not present

## 2017-11-06 ENCOUNTER — Telehealth: Payer: Self-pay | Admitting: *Deleted

## 2017-11-06 ENCOUNTER — Encounter: Payer: Self-pay | Admitting: *Deleted

## 2017-11-06 ENCOUNTER — Ambulatory Visit (INDEPENDENT_AMBULATORY_CARE_PROVIDER_SITE_OTHER): Payer: Medicare Other | Admitting: *Deleted

## 2017-11-06 DIAGNOSIS — I481 Persistent atrial fibrillation: Secondary | ICD-10-CM | POA: Diagnosis not present

## 2017-11-06 DIAGNOSIS — I4819 Other persistent atrial fibrillation: Secondary | ICD-10-CM

## 2017-11-06 DIAGNOSIS — Z5181 Encounter for therapeutic drug level monitoring: Secondary | ICD-10-CM | POA: Diagnosis not present

## 2017-11-06 DIAGNOSIS — Z9889 Other specified postprocedural states: Secondary | ICD-10-CM

## 2017-11-06 LAB — POCT INR: INR: 2.4 (ref 2.0–3.0)

## 2017-11-06 MED ORDER — METOPROLOL SUCCINATE ER 50 MG PO TB24: ORAL_TABLET | ORAL | 6 refills | Status: DC

## 2017-11-06 NOTE — Telephone Encounter (Signed)
Chart reviewed by Dr. Bronson Ing. Per Dr. Bronson Ing, patient is to stop dilitazem 30 mg. Hold toprol xl 50 mg tomorrow. On 11/08/17 decrease toprol xl to 25 mg by mouth daily. Continue all other medications the same. If symptoms get worse, go to the ED for an evaluation at [facility].

## 2017-11-06 NOTE — Telephone Encounter (Signed)
Patient informed and verbalized understanding of plan. 

## 2017-11-06 NOTE — Telephone Encounter (Signed)
Weight 168 lbs today. Patient has been taking furosemide 40 mg daily. Advised to take it twice daily as needed for swelling or weight gain. Apical pulse for one full minute 40 (2+ and regular) O2 Sat 96% on room air Swelling noted to face, under eyes Dependent edema BLE Reports more SOB today than previous Lungs clear in all lobes to auscultation Capillary refill less than 3 seconds Denies chest pain, n/v, fever. Alert and oriented x's 4 Gait steady without assistance Patient reports that she has been having problems with lightheadedness and wobbliness when standing, intermittently for 2 months.

## 2017-11-06 NOTE — Telephone Encounter (Signed)
Pt says for the last week she has been dizzy lightheaded and feeling drunk - pt walked into office thinking she had an appt today is actually tomorrow. Says only recent changes are thyroid medication but doesn't know what med name or dose. Pt says she is swelling in feet and face. Says she was recently at [facility] 4 days but doesn't know why she was admitted only that she went in for face swelling. BP 110/60 hr 40

## 2017-11-06 NOTE — Patient Instructions (Signed)
Increase coumadin to 1 tablet daily Recheck in 2 weeks

## 2017-11-08 DIAGNOSIS — I340 Nonrheumatic mitral (valve) insufficiency: Secondary | ICD-10-CM | POA: Diagnosis not present

## 2017-11-08 DIAGNOSIS — Z299 Encounter for prophylactic measures, unspecified: Secondary | ICD-10-CM | POA: Diagnosis not present

## 2017-11-08 DIAGNOSIS — E039 Hypothyroidism, unspecified: Secondary | ICD-10-CM | POA: Diagnosis not present

## 2017-11-08 DIAGNOSIS — I499 Cardiac arrhythmia, unspecified: Secondary | ICD-10-CM | POA: Diagnosis not present

## 2017-11-08 DIAGNOSIS — N183 Chronic kidney disease, stage 3 (moderate): Secondary | ICD-10-CM | POA: Diagnosis not present

## 2017-11-08 DIAGNOSIS — J449 Chronic obstructive pulmonary disease, unspecified: Secondary | ICD-10-CM | POA: Diagnosis not present

## 2017-11-08 DIAGNOSIS — I4891 Unspecified atrial fibrillation: Secondary | ICD-10-CM | POA: Diagnosis not present

## 2017-11-08 DIAGNOSIS — Z6823 Body mass index (BMI) 23.0-23.9, adult: Secondary | ICD-10-CM | POA: Diagnosis not present

## 2017-11-08 DIAGNOSIS — R600 Localized edema: Secondary | ICD-10-CM | POA: Diagnosis not present

## 2017-11-09 DIAGNOSIS — F419 Anxiety disorder, unspecified: Secondary | ICD-10-CM | POA: Diagnosis not present

## 2017-11-09 DIAGNOSIS — Z9049 Acquired absence of other specified parts of digestive tract: Secondary | ICD-10-CM | POA: Diagnosis not present

## 2017-11-09 DIAGNOSIS — S3991XA Unspecified injury of abdomen, initial encounter: Secondary | ICD-10-CM | POA: Diagnosis not present

## 2017-11-09 DIAGNOSIS — R599 Enlarged lymph nodes, unspecified: Secondary | ICD-10-CM | POA: Diagnosis not present

## 2017-11-09 DIAGNOSIS — W010XXA Fall on same level from slipping, tripping and stumbling without subsequent striking against object, initial encounter: Secondary | ICD-10-CM | POA: Diagnosis not present

## 2017-11-09 DIAGNOSIS — R1032 Left lower quadrant pain: Secondary | ICD-10-CM | POA: Diagnosis not present

## 2017-11-09 DIAGNOSIS — J45909 Unspecified asthma, uncomplicated: Secondary | ICD-10-CM | POA: Diagnosis not present

## 2017-11-09 DIAGNOSIS — Z85038 Personal history of other malignant neoplasm of large intestine: Secondary | ICD-10-CM | POA: Diagnosis not present

## 2017-11-09 DIAGNOSIS — R590 Localized enlarged lymph nodes: Secondary | ICD-10-CM | POA: Diagnosis not present

## 2017-11-09 DIAGNOSIS — Z79899 Other long term (current) drug therapy: Secondary | ICD-10-CM | POA: Diagnosis not present

## 2017-11-09 DIAGNOSIS — Z7982 Long term (current) use of aspirin: Secondary | ICD-10-CM | POA: Diagnosis not present

## 2017-11-09 DIAGNOSIS — Z7901 Long term (current) use of anticoagulants: Secondary | ICD-10-CM | POA: Diagnosis not present

## 2017-11-09 DIAGNOSIS — K219 Gastro-esophageal reflux disease without esophagitis: Secondary | ICD-10-CM | POA: Diagnosis not present

## 2017-11-09 DIAGNOSIS — S20212A Contusion of left front wall of thorax, initial encounter: Secondary | ICD-10-CM | POA: Diagnosis not present

## 2017-11-11 DIAGNOSIS — R590 Localized enlarged lymph nodes: Secondary | ICD-10-CM | POA: Diagnosis not present

## 2017-11-11 DIAGNOSIS — J449 Chronic obstructive pulmonary disease, unspecified: Secondary | ICD-10-CM | POA: Diagnosis not present

## 2017-11-11 DIAGNOSIS — Z299 Encounter for prophylactic measures, unspecified: Secondary | ICD-10-CM | POA: Diagnosis not present

## 2017-11-11 DIAGNOSIS — Z6824 Body mass index (BMI) 24.0-24.9, adult: Secondary | ICD-10-CM | POA: Diagnosis not present

## 2017-11-11 DIAGNOSIS — N183 Chronic kidney disease, stage 3 (moderate): Secondary | ICD-10-CM | POA: Diagnosis not present

## 2017-11-11 DIAGNOSIS — D509 Iron deficiency anemia, unspecified: Secondary | ICD-10-CM | POA: Diagnosis not present

## 2017-11-12 ENCOUNTER — Encounter: Payer: Self-pay | Admitting: Internal Medicine

## 2017-11-12 ENCOUNTER — Ambulatory Visit (INDEPENDENT_AMBULATORY_CARE_PROVIDER_SITE_OTHER): Payer: Medicare Other | Admitting: Internal Medicine

## 2017-11-12 VITALS — BP 132/78 | HR 64 | Ht 62.0 in | Wt 158.0 lb

## 2017-11-12 DIAGNOSIS — I4819 Other persistent atrial fibrillation: Secondary | ICD-10-CM

## 2017-11-12 DIAGNOSIS — I495 Sick sinus syndrome: Secondary | ICD-10-CM | POA: Diagnosis not present

## 2017-11-12 DIAGNOSIS — I481 Persistent atrial fibrillation: Secondary | ICD-10-CM | POA: Diagnosis not present

## 2017-11-12 MED ORDER — AMIODARONE HCL 200 MG PO TABS: 200.0000 mg | ORAL_TABLET | Freq: Every day | ORAL | 3 refills | Status: DC

## 2017-11-12 NOTE — Patient Instructions (Signed)
Medication Instructions:  Your physician has recommended you make the following change in your medication:  Stop Taking Toprol XL  Decrease Amiodarone to One Tablet Daily and NONE on Sunday.    Labwork: NONE   Testing/Procedures: NONE   Follow-Up: Your physician recommends that you schedule a follow-up appointment in: 3-4 Months with Dr. Lovena Le.    Any Other Special Instructions Will Be Listed Below (If Applicable).     If you need a refill on your cardiac medications before your next appointment, please call your pharmacy. Thank you for choosing [facility]!

## 2017-11-12 NOTE — Progress Notes (Signed)
HPI [name] [name] returns today for followup of mitral regurge, s/p repair, MAZE, PAF, sinus node dysfunction and HTN.  She has not had syncope, chest pain or sob. No edema. She is fairly sedentary. She does note a decrease in energy. She wore a 24 hour holter which demonstrated marked bradycardia with an ave HR in the 40's. She has not had syncope.  Allergies  Allergen Reactions  . Iohexol Hives and Shortness Of Breath     Pt. had a severe allergic reaction to IV contrast the last time she was injected and had to be seen in the ER.   Marland Kitchen Hydrocodone-Acetaminophen Hives  . Nabumetone Rash  . Prednisone Rash     Current Outpatient Medications  Medication Sig Dispense Refill  . acetaminophen (TYLENOL) 500 MG tablet Take 1,000 mg by mouth 2 (two) times daily as needed for headache.    . albuterol (PROVENTIL HFA;VENTOLIN HFA) 108 (90 BASE) MCG/ACT inhaler Inhale 2 puffs into the lungs every 6 (six) hours as needed for wheezing.    Marland Kitchen albuterol (PROVENTIL) (2.5 MG/3ML) 0.083% nebulizer solution Inhale 3 mLs into the lungs at bedtime. May inhale a second dose during the day as needed for shortness of breath  12  . ALPRAZolam (XANAX) 0.5 MG tablet Take 0.5 mg by mouth 2 (two) times daily as needed (for anxiety/sleep (scheduled at bedtime)).     Marland Kitchen amiodarone (PACERONE) 200 MG tablet Take 1 tablet (200 mg total) by mouth daily. 90 tablet 3  . aspirin EC 81 MG EC tablet Take 1 tablet (81 mg total) by mouth daily.    . calcium carbonate (OS-CAL) 600 MG TABS tablet Take 600 mg by mouth 2 (two) times daily with a meal.    . Calcium Carbonate-Vitamin D 600-400 MG-UNIT tablet Take 1 tablet by mouth 2 (two) times daily.    . Coenzyme Q10 100 MG TABS Take 100 mg by mouth every evening.     . docusate sodium (COLACE) 100 MG capsule Take 200 mg by mouth daily as needed for mild constipation.     . furosemide (LASIX) 40 MG tablet Take 40 mg by mouth 2 (two) times daily as needed.    . hydrocortisone  (ANUSOL-HC) 2.5 % rectal cream Place 1 application rectally 2 (two) times daily. FOR UP TO 10 DAYS AT A TIME (Patient taking differently: Place 1 application rectally 2 (two) times daily as needed for hemorrhoids or itching. FOR UP TO 10 DAYS AT A TIME) 30 g 1  . levothyroxine (SYNTHROID, LEVOTHROID) 75 MCG tablet Take 1 tablet by mouth daily.  4  . metoprolol succinate (TOPROL-XL) 50 MG 24 hr tablet Hold metoprolol succinate on 11/07/17. Take 25 mg (1/2 of 50 mg tablet) by mouth daily starting 11/08/17.Take with or immediately following a meal. 15 tablet 6  . Multiple Vitamin (MULTIVITAMIN) tablet Take 1 tablet by mouth daily.      Marland Kitchen omeprazole (PRILOSEC) 20 MG capsule Take 1 capsule (20 mg total) by mouth daily. 30 capsule 3  . PARoxetine (PAXIL) 20 MG tablet Take 20 mg by mouth daily.     Vladimir Faster Glycol-Propyl Glycol (SYSTANE ULTRA) 0.4-0.3 % SOLN Place 1 drop into both eyes daily as needed (dry eyes).     . potassium chloride SA (K-DUR,KLOR-CON) 20 MEQ tablet Take 1 tablet (20 mEq total) by mouth 2 (two) times daily. 180 tablet 3  . simvastatin (ZOCOR) 40 MG tablet Take 40 mg daily by mouth.    Marland Kitchen  sodium chloride (OCEAN) 0.65 % SOLN nasal spray Place 1 spray into both nostrils as needed for congestion.    . traMADol (ULTRAM) 50 MG tablet Take 1 tablet (50 mg total) by mouth every 6 (six) hours as needed. (Patient taking differently: Take 50 mg by mouth every 6 (six) hours as needed for moderate pain. ) 15 tablet 0  . warfarin (COUMADIN) 2.5 MG tablet Take 1 tablet daily except 1 1/2 tablets on Sundays and Thursdays or as directed (Patient taking differently: Take 2.5 mgt daily around 6 pm except on Sundays, Tuesdays, and Thursdays take 3.75 mg once daily) 100 tablet 3   No current facility-administered medications for this visit.      Past Medical History:  Diagnosis Date  . Anxiety   . Arthritis   . Asthma   . Atrial fibrillation, persistent ([place])   . Chronic diastolic congestive heart  failure ([place])   . Colon adenoma   . Colon cancer ([place])    status post low anterior resection, limited stage disease requiring no adjuvant therapy  . COPD (chronic obstructive pulmonary disease) ([place])   . Depression   . Diverticulosis   . DM (dermatomyositis)   . DVT (deep venous thrombosis) ([name])    in leg- long time ago  . Dyspnea    with activity  . Dysrhythmia   . Esophageal dysphagia   . GERD (gastroesophageal reflux disease)   . Headache   . Heart murmur   . Hematuria   . Hemorrhoids   . Hiatal hernia   . History of kidney stones    x 2  . Hypercholesterolemia   . Incidental pulmonary nodule 05/08/2016   8 mm vague opacity RML noted on CT scan  . Mitral regurgitation   . PONV (postoperative nausea and vomiting) 2003 ish    with breast biopsy  . S/P minimally invasive maze operation for atrial fibrillation 07/05/2016   Complete bilateral atrial lesion set using cryothermy and bipolar radiofrequency ablation with clipping of LA appendage via right mini thoracotomy approach  . 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 Schatzki's ring   . Tricuspid regurgitation     ROS:   All systems reviewed and negative except as noted in the HPI.   Past Surgical History:  Procedure Laterality Date  . APPENDECTOMY    . BREAST SURGERY Right 2003ish   biopsy  . CARDIAC CATHETERIZATION N/A 04/12/2016   Procedure: Right/Left Heart Cath and Coronary Angiography;  Surgeon: Leonie Man, MD;  Location: [facility];  Service: Cardiovascular;  Laterality: N/A;  . CARDIOVERSION N/A 08/16/2017   Procedure: CARDIOVERSION;  Surgeon: Arnoldo Lenis, MD;  Location: [name];  Service: Endoscopy;  Laterality: N/A;  . CATARACT EXTRACTION Bilateral 2017  . CLIPPING OF ATRIAL APPENDAGE  07/05/2016   Procedure: CLIPPING OF  ATRIAL APPENDAGE;  Surgeon: Rexene Alberts, MD;  Location: [facility];  Service: Open Heart Surgery;;  . COLONOSCOPY  11/09   Dr. Gala Romney- external hemorrhoidal tags o/w normal rectal mucosa, s/p surgical resection with a normal appearing anastomosis 12cm, pan colonic diverticulum  . COLONOSCOPY N/A 06/02/2012   JAS:[id number] post low anterior resection. Pancolonic diverticulosis. Colonic polyp-tubular adenoma. Surveillance due 2019.   Marland Kitchen COLONOSCOPY N/A 10/13/2015   Procedure: COLONOSCOPY;  Surgeon: Daneil Dolin, MD;  [name];  Service: Endoscopy;  Laterality: N/A;  0930  . ESOPHAGOGASTRODUODENOSCOPY  07/2007   Dr. Daiva Nakayama cervical esophageal web, schatzki ring, large hiatal hernia  . INGUNAL HERNIA REPAIR Left   . LOW ANTERIOR BOWEL RESECTION     NO ADJ CHEMO  . MINIMALLY INVASIVE MAZE PROCEDURE N/A 07/05/2016   Procedure: MINIMALLY INVASIVE MAZE PROCEDURE;  Surgeon: Rexene Alberts, MD;  Location: [facility];  Service: Open Heart Surgery;  Laterality: N/A;  . MITRAL VALVE REPAIR Right 07/05/2016   Procedure: MINIMALLY INVASIVE MITRAL VALVE REPAIR (MVR);  Surgeon: Rexene Alberts, MD;  Location: [facility];  Service: Open Heart Surgery;  Laterality: Right;  . MULTIPLE EXTRACTIONS WITH ALVEOLOPLASTY N/A 05/21/2016   Procedure: MULTIPLE EXTRACTION OF TOOTH #'S 5, 21 WITH ALVEOLOPLASTY AND GROSS DEBRIDEMENT OF TEETH;  Surgeon: Lenn Cal, DDS;  Location: [facility];  Service: Oral Surgery;  Laterality: N/A;  . TEE WITHOUT CARDIOVERSION N/A 02/20/2016   Procedure: TRANSESOPHAGEAL ECHOCARDIOGRAM (TEE);  Surgeon: Arnoldo Lenis, MD;  [name];  Service: Endoscopy;  Laterality: N/A;  . TEE WITHOUT CARDIOVERSION N/A 07/05/2016   Procedure: TRANSESOPHAGEAL ECHOCARDIOGRAM (TEE);  Surgeon: Rexene Alberts, MD;  Location: [facility];  Service: Open Heart Surgery;  Laterality: N/A;  . TOOTH EXTRACTION    . TUBAL LIGATION    . VEIN LIGATION AND STRIPPING       Family History  Problem  Relation Age of Onset  . Breast cancer Mother   . Rheum arthritis Father   . Cancer - Lung Sister   . Brain cancer Sister   . Prostate cancer Brother   . Aneurysm Brother   . Colon cancer Neg Hx      Social History   Socioeconomic History  . Marital status: Married    Spouse name: Not on file  . Number of children: 2  . Years of education: Not on file  . Highest education level: Not on file  Occupational History  . Not on file  Social Needs  . Financial resource strain: Not on file  . Food insecurity:    Worry: Not on file    Inability: Not on file  . Transportation needs:    Medical: Not on file    Non-medical: Not on file  Tobacco Use  . Smoking status: Never Smoker  . Smokeless tobacco: Never Used  Substance and Sexual Activity  . Alcohol use: No    Alcohol/week: 0.0 oz    Comment: Rarely  . Drug use: No  . Sexual activity: Not on file  Lifestyle  . Physical activity:    Days per week: Not on file    Minutes per session: Not on file  . Stress: Not on file  Relationships  . Social connections:    Talks on phone: Not on file    Gets together: Not on file    Attends religious service: Not on file    Active member of club or organization: Not on file    Attends meetings of clubs or organizations: Not on file    Relationship status: Not on file  . Intimate partner violence:    Fear of current or ex partner: Not on file    Emotionally abused: Not on file    Physically abused: Not on file    Forced sexual activity: Not on file  Other Topics Concern  . Not on file  Social History Narrative  . Not on file     BP 132/78 (BP Location: Left Arm)  Pulse 64   Ht 5\' 2"  (1.575 m)   Wt 158 lb (71.7 kg)   SpO2 (!) 89%   BMI 28.90 kg/m   Physical Exam:  Well appearing 77 yo woman, NAD HEENT: Unremarkable Neck:  6 cm JVD, no thyromegally Lymphatics:  No adenopathy Back:  No CVA tenderness Lungs:  Clear with no wheezes HEART:  Regular rate rhythm, no  murmurs, no rubs, no clicks Abd:  soft, positive bowel sounds, no organomegally, no rebound, no guarding Ext:  2 plus pulses, 1+ edema, no cyanosis, no clubbing Skin:  No rashes no nodules Neuro:  CN II through XII intact, motor grossly intact  DEVICE  Normal device function.  See PaceArt for details.   Assess/Plan: 1. Atrial fib - she is maintaining NSR on amiodarone. I have asked her to reduce her dose of amio but taking none on Sundays. 2. Sinus node dysfunction - she is better today but was very slow and I have asked her to stop her metoprolol. 3. HTN - her blood pressure is well controlled. 4. Chronic distolic heart failure - she appears mostly euvolemic. She will continue her current meds.  Mikle Bosworth.D.

## 2017-11-21 ENCOUNTER — Ambulatory Visit (INDEPENDENT_AMBULATORY_CARE_PROVIDER_SITE_OTHER): Payer: Medicare Other | Admitting: *Deleted

## 2017-11-21 DIAGNOSIS — Z9889 Other specified postprocedural states: Secondary | ICD-10-CM

## 2017-11-21 DIAGNOSIS — I481 Persistent atrial fibrillation: Secondary | ICD-10-CM | POA: Diagnosis not present

## 2017-11-21 DIAGNOSIS — Z5181 Encounter for therapeutic drug level monitoring: Secondary | ICD-10-CM

## 2017-11-21 DIAGNOSIS — I4819 Other persistent atrial fibrillation: Secondary | ICD-10-CM

## 2017-11-21 LAB — POCT INR: INR: 1.3 — AB (ref 2.0–3.0)

## 2017-11-21 NOTE — Patient Instructions (Signed)
Pt states somebody told her to stop coumadin until she saw me back.  No notation of this in chart. Pt to restart coumadin 1 tablet daily Recheck in 1 week

## 2017-11-27 DIAGNOSIS — M7989 Other specified soft tissue disorders: Secondary | ICD-10-CM | POA: Diagnosis not present

## 2017-11-27 DIAGNOSIS — S60221A Contusion of right hand, initial encounter: Secondary | ICD-10-CM | POA: Diagnosis not present

## 2017-11-27 DIAGNOSIS — F419 Anxiety disorder, unspecified: Secondary | ICD-10-CM | POA: Diagnosis not present

## 2017-11-27 DIAGNOSIS — I2510 Atherosclerotic heart disease of native coronary artery without angina pectoris: Secondary | ICD-10-CM | POA: Diagnosis not present

## 2017-11-27 DIAGNOSIS — S52591A Other fractures of lower end of right radius, initial encounter for closed fracture: Secondary | ICD-10-CM | POA: Diagnosis not present

## 2017-11-27 DIAGNOSIS — S52501A Unspecified fracture of the lower end of right radius, initial encounter for closed fracture: Secondary | ICD-10-CM | POA: Diagnosis not present

## 2017-11-27 DIAGNOSIS — Z7982 Long term (current) use of aspirin: Secondary | ICD-10-CM | POA: Diagnosis not present

## 2017-11-27 DIAGNOSIS — Z7901 Long term (current) use of anticoagulants: Secondary | ICD-10-CM | POA: Diagnosis not present

## 2017-11-27 DIAGNOSIS — Z79899 Other long term (current) drug therapy: Secondary | ICD-10-CM | POA: Diagnosis not present

## 2017-11-27 DIAGNOSIS — Z85038 Personal history of other malignant neoplasm of large intestine: Secondary | ICD-10-CM | POA: Diagnosis not present

## 2017-11-27 DIAGNOSIS — M79641 Pain in right hand: Secondary | ICD-10-CM | POA: Diagnosis not present

## 2017-11-27 DIAGNOSIS — W228XXA Striking against or struck by other objects, initial encounter: Secondary | ICD-10-CM | POA: Diagnosis not present

## 2017-11-27 DIAGNOSIS — Z9049 Acquired absence of other specified parts of digestive tract: Secondary | ICD-10-CM | POA: Diagnosis not present

## 2017-11-27 DIAGNOSIS — S6991XA Unspecified injury of right wrist, hand and finger(s), initial encounter: Secondary | ICD-10-CM | POA: Diagnosis not present

## 2017-11-27 DIAGNOSIS — K219 Gastro-esophageal reflux disease without esophagitis: Secondary | ICD-10-CM | POA: Diagnosis not present

## 2017-11-28 ENCOUNTER — Ambulatory Visit (INDEPENDENT_AMBULATORY_CARE_PROVIDER_SITE_OTHER): Payer: Medicare Other | Admitting: *Deleted

## 2017-11-28 DIAGNOSIS — Z5181 Encounter for therapeutic drug level monitoring: Secondary | ICD-10-CM | POA: Diagnosis not present

## 2017-11-28 DIAGNOSIS — Z9889 Other specified postprocedural states: Secondary | ICD-10-CM

## 2017-11-28 DIAGNOSIS — I481 Persistent atrial fibrillation: Secondary | ICD-10-CM | POA: Diagnosis not present

## 2017-11-28 DIAGNOSIS — I4819 Other persistent atrial fibrillation: Secondary | ICD-10-CM

## 2017-11-28 LAB — POCT INR: INR: 2 (ref 2.0–3.0)

## 2017-11-28 NOTE — Patient Instructions (Signed)
Continue coumadin 1 tablet daily Recheck in 2 weeks 

## 2017-12-02 DIAGNOSIS — I5032 Chronic diastolic (congestive) heart failure: Secondary | ICD-10-CM | POA: Diagnosis not present

## 2017-12-02 DIAGNOSIS — S52501A Unspecified fracture of the lower end of right radius, initial encounter for closed fracture: Secondary | ICD-10-CM | POA: Diagnosis not present

## 2017-12-02 DIAGNOSIS — J449 Chronic obstructive pulmonary disease, unspecified: Secondary | ICD-10-CM | POA: Diagnosis not present

## 2017-12-02 DIAGNOSIS — Z6825 Body mass index (BMI) 25.0-25.9, adult: Secondary | ICD-10-CM | POA: Diagnosis not present

## 2017-12-02 DIAGNOSIS — I071 Rheumatic tricuspid insufficiency: Secondary | ICD-10-CM | POA: Diagnosis not present

## 2017-12-02 DIAGNOSIS — W19XXXA Unspecified fall, initial encounter: Secondary | ICD-10-CM | POA: Diagnosis not present

## 2017-12-02 DIAGNOSIS — Z79899 Other long term (current) drug therapy: Secondary | ICD-10-CM | POA: Diagnosis not present

## 2017-12-02 DIAGNOSIS — Z299 Encounter for prophylactic measures, unspecified: Secondary | ICD-10-CM | POA: Diagnosis not present

## 2017-12-02 DIAGNOSIS — E039 Hypothyroidism, unspecified: Secondary | ICD-10-CM | POA: Diagnosis not present

## 2017-12-02 DIAGNOSIS — N183 Chronic kidney disease, stage 3 (moderate): Secondary | ICD-10-CM | POA: Diagnosis not present

## 2017-12-02 DIAGNOSIS — S52601A Unspecified fracture of lower end of right ulna, initial encounter for closed fracture: Secondary | ICD-10-CM | POA: Diagnosis not present

## 2017-12-05 DIAGNOSIS — J449 Chronic obstructive pulmonary disease, unspecified: Secondary | ICD-10-CM | POA: Diagnosis not present

## 2017-12-05 DIAGNOSIS — M159 Polyosteoarthritis, unspecified: Secondary | ICD-10-CM | POA: Diagnosis not present

## 2017-12-05 DIAGNOSIS — E7800 Pure hypercholesterolemia, unspecified: Secondary | ICD-10-CM | POA: Diagnosis not present

## 2017-12-05 DIAGNOSIS — I4891 Unspecified atrial fibrillation: Secondary | ICD-10-CM | POA: Diagnosis not present

## 2017-12-06 DIAGNOSIS — R809 Proteinuria, unspecified: Secondary | ICD-10-CM | POA: Diagnosis not present

## 2017-12-06 DIAGNOSIS — Z09 Encounter for follow-up examination after completed treatment for conditions other than malignant neoplasm: Secondary | ICD-10-CM | POA: Diagnosis not present

## 2017-12-06 DIAGNOSIS — Z85038 Personal history of other malignant neoplasm of large intestine: Secondary | ICD-10-CM | POA: Diagnosis not present

## 2017-12-06 DIAGNOSIS — R591 Generalized enlarged lymph nodes: Secondary | ICD-10-CM | POA: Diagnosis not present

## 2017-12-06 DIAGNOSIS — Z801 Family history of malignant neoplasm of trachea, bronchus and lung: Secondary | ICD-10-CM | POA: Diagnosis not present

## 2017-12-06 DIAGNOSIS — Z952 Presence of prosthetic heart valve: Secondary | ICD-10-CM | POA: Diagnosis not present

## 2017-12-06 DIAGNOSIS — I482 Chronic atrial fibrillation: Secondary | ICD-10-CM | POA: Diagnosis not present

## 2017-12-06 DIAGNOSIS — Z6824 Body mass index (BMI) 24.0-24.9, adult: Secondary | ICD-10-CM | POA: Diagnosis not present

## 2017-12-06 DIAGNOSIS — F329 Major depressive disorder, single episode, unspecified: Secondary | ICD-10-CM | POA: Diagnosis not present

## 2017-12-06 DIAGNOSIS — J449 Chronic obstructive pulmonary disease, unspecified: Secondary | ICD-10-CM | POA: Diagnosis not present

## 2017-12-06 DIAGNOSIS — E7800 Pure hypercholesterolemia, unspecified: Secondary | ICD-10-CM | POA: Diagnosis not present

## 2017-12-06 DIAGNOSIS — R601 Generalized edema: Secondary | ICD-10-CM | POA: Diagnosis not present

## 2017-12-06 DIAGNOSIS — Z8509 Personal history of malignant neoplasm of other digestive organs: Secondary | ICD-10-CM | POA: Diagnosis not present

## 2017-12-06 DIAGNOSIS — K219 Gastro-esophageal reflux disease without esophagitis: Secondary | ICD-10-CM | POA: Diagnosis not present

## 2017-12-06 DIAGNOSIS — R9389 Abnormal findings on diagnostic imaging of other specified body structures: Secondary | ICD-10-CM | POA: Diagnosis not present

## 2017-12-06 DIAGNOSIS — K7460 Unspecified cirrhosis of liver: Secondary | ICD-10-CM | POA: Diagnosis not present

## 2017-12-06 DIAGNOSIS — R188 Other ascites: Secondary | ICD-10-CM | POA: Diagnosis not present

## 2017-12-06 DIAGNOSIS — R7989 Other specified abnormal findings of blood chemistry: Secondary | ICD-10-CM | POA: Diagnosis not present

## 2017-12-06 DIAGNOSIS — J45909 Unspecified asthma, uncomplicated: Secondary | ICD-10-CM | POA: Diagnosis not present

## 2017-12-06 DIAGNOSIS — D124 Benign neoplasm of descending colon: Secondary | ICD-10-CM | POA: Diagnosis not present

## 2017-12-06 DIAGNOSIS — Z803 Family history of malignant neoplasm of breast: Secondary | ICD-10-CM | POA: Diagnosis not present

## 2017-12-06 DIAGNOSIS — M1990 Unspecified osteoarthritis, unspecified site: Secondary | ICD-10-CM | POA: Diagnosis not present

## 2017-12-06 DIAGNOSIS — G893 Neoplasm related pain (acute) (chronic): Secondary | ICD-10-CM | POA: Diagnosis not present

## 2017-12-06 DIAGNOSIS — R1909 Other intra-abdominal and pelvic swelling, mass and lump: Secondary | ICD-10-CM | POA: Diagnosis not present

## 2017-12-06 DIAGNOSIS — Z86718 Personal history of other venous thrombosis and embolism: Secondary | ICD-10-CM | POA: Diagnosis not present

## 2017-12-06 DIAGNOSIS — I5041 Acute combined systolic (congestive) and diastolic (congestive) heart failure: Secondary | ICD-10-CM | POA: Diagnosis not present

## 2017-12-06 DIAGNOSIS — F419 Anxiety disorder, unspecified: Secondary | ICD-10-CM | POA: Diagnosis not present

## 2017-12-09 DIAGNOSIS — E876 Hypokalemia: Secondary | ICD-10-CM | POA: Diagnosis not present

## 2017-12-10 ENCOUNTER — Ambulatory Visit (INDEPENDENT_AMBULATORY_CARE_PROVIDER_SITE_OTHER): Payer: Medicare Other | Admitting: "Endocrinology

## 2017-12-10 ENCOUNTER — Ambulatory Visit (HOSPITAL_COMMUNITY): Admission: RE | Admit: 2017-12-10 | Payer: Medicare Other | Source: Ambulatory Visit | Admitting: *Deleted

## 2017-12-10 ENCOUNTER — Encounter: Payer: Self-pay | Admitting: "Endocrinology

## 2017-12-10 ENCOUNTER — Other Ambulatory Visit: Payer: Self-pay

## 2017-12-10 ENCOUNTER — Other Ambulatory Visit (HOSPITAL_COMMUNITY)
Admission: RE | Admit: 2017-12-10 | Discharge: 2017-12-10 | Disposition: A | Discharge status: Home / Self Care | Payer: Medicare Other | Source: Ambulatory Visit | Attending: "Endocrinology | Admitting: "Endocrinology

## 2017-12-10 VITALS — BP 165/78 | HR 59 | Ht 62.0 in | Wt 137.0 lb

## 2017-12-10 DIAGNOSIS — R6889 Other general symptoms and signs: Secondary | ICD-10-CM | POA: Diagnosis not present

## 2017-12-10 DIAGNOSIS — R9389 Abnormal findings on diagnostic imaging of other specified body structures: Secondary | ICD-10-CM | POA: Diagnosis not present

## 2017-12-10 DIAGNOSIS — E032 Hypothyroidism due to medicaments and other exogenous substances: Secondary | ICD-10-CM

## 2017-12-10 DIAGNOSIS — K7460 Unspecified cirrhosis of liver: Secondary | ICD-10-CM | POA: Diagnosis not present

## 2017-12-10 DIAGNOSIS — R601 Generalized edema: Secondary | ICD-10-CM | POA: Diagnosis not present

## 2017-12-10 DIAGNOSIS — R809 Proteinuria, unspecified: Secondary | ICD-10-CM | POA: Diagnosis not present

## 2017-12-10 DIAGNOSIS — J449 Chronic obstructive pulmonary disease, unspecified: Secondary | ICD-10-CM | POA: Diagnosis not present

## 2017-12-10 DIAGNOSIS — Z8509 Personal history of malignant neoplasm of other digestive organs: Secondary | ICD-10-CM | POA: Diagnosis not present

## 2017-12-10 DIAGNOSIS — R591 Generalized enlarged lymph nodes: Secondary | ICD-10-CM | POA: Diagnosis not present

## 2017-12-10 NOTE — Progress Notes (Signed)
Endocrinology Consult Note                                            12/10/2017, 4:36 PM   Subjective:    Patient ID: [name] [name], female    DOB: 1940/06/15, PCP Glenda Chroman, MD   Past Medical History:  Diagnosis Date  . Anxiety   . Arthritis   . Asthma   . Atrial fibrillation, persistent ([place])   . Chronic diastolic congestive heart failure ([place])   . Colon adenoma   . Colon cancer ([place])    status post low anterior resection, limited stage disease requiring no adjuvant therapy  . COPD (chronic obstructive pulmonary disease) ([place])   . Depression   . Diverticulosis   . DM (dermatomyositis)   . DVT (deep venous thrombosis) ([name])    in leg- long time ago  . Dyspnea    with activity  . Dysrhythmia   . Esophageal dysphagia   . GERD (gastroesophageal reflux disease)   . Headache   . Heart murmur   . Hematuria   . Hemorrhoids   . Hiatal hernia   . History of kidney stones    x 2  . Hypercholesterolemia   . Incidental pulmonary nodule 05/08/2016   8 mm vague opacity RML noted on CT scan  . Mitral regurgitation   . PONV (postoperative nausea and vomiting) 2003 ish    with breast biopsy  . S/P minimally invasive maze operation for atrial fibrillation 07/05/2016   Complete bilateral atrial lesion set using cryothermy and bipolar radiofrequency ablation with clipping of LA appendage via right mini thoracotomy approach  . 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 Schatzki's ring   . Tricuspid regurgitation    Past Surgical History:  Procedure Laterality Date  . APPENDECTOMY    . BREAST SURGERY Right 2003ish   biopsy  . CARDIAC CATHETERIZATION N/A 04/12/2016   Procedure: Right/Left Heart Cath and Coronary Angiography;  Surgeon: Leonie Man, MD;  Location: [facility];  Service:  Cardiovascular;  Laterality: N/A;  . CARDIOVERSION N/A 08/16/2017   Procedure: CARDIOVERSION;  Surgeon: Arnoldo Lenis, MD;  Location: [name];  Service: Endoscopy;  Laterality: N/A;  . CATARACT EXTRACTION Bilateral 2017  . CLIPPING OF ATRIAL APPENDAGE  07/05/2016   Procedure: CLIPPING OF ATRIAL APPENDAGE;  Surgeon: Rexene Alberts, MD;  Location: [facility];  Service: Open Heart Surgery;;  . COLONOSCOPY  11/09   Dr. Gala Romney- external hemorrhoidal tags o/w normal rectal mucosa, s/p surgical resection with a normal appearing anastomosis 12cm, pan colonic diverticulum  . COLONOSCOPY N/A 06/02/2012   DJS:HFWYOV post low anterior resection. Pancolonic diverticulosis. Colonic polyp-tubular adenoma. Surveillance due 2019.   Marland Kitchen COLONOSCOPY N/A 10/13/2015   Procedure: COLONOSCOPY;  Surgeon: Daneil Dolin, MD;  [name];  Service: Endoscopy;  Laterality: N/A;  0930  . ESOPHAGOGASTRODUODENOSCOPY  07/2007   Dr. Daiva Nakayama cervical esophageal web, schatzki ring, large hiatal hernia  . INGUNAL HERNIA REPAIR Left   . LOW ANTERIOR BOWEL RESECTION     NO ADJ CHEMO  . MINIMALLY INVASIVE MAZE PROCEDURE N/A 07/05/2016   Procedure: MINIMALLY INVASIVE MAZE PROCEDURE;  Surgeon: Rexene Alberts,  MD;  Location: [facility];  Service: Open Heart Surgery;  Laterality: N/A;  . MITRAL VALVE REPAIR Right 07/05/2016   Procedure: MINIMALLY INVASIVE MITRAL VALVE REPAIR (MVR);  Surgeon: Rexene Alberts, MD;  Location: [place];  Service: Open Heart Surgery;  Laterality: Right;  . MULTIPLE EXTRACTIONS WITH ALVEOLOPLASTY N/A 05/21/2016   Procedure: MULTIPLE EXTRACTION OF TOOTH #'S 5, 21 WITH ALVEOLOPLASTY AND GROSS DEBRIDEMENT OF TEETH;  Surgeon: Lenn Cal, DDS;  Location: [place] [place];  Service: Oral Surgery;  Laterality: N/A;  . TEE WITHOUT CARDIOVERSION N/A 02/20/2016   Procedure: TRANSESOPHAGEAL ECHOCARDIOGRAM (TEE);  Surgeon: Arnoldo Lenis, MD;  [name];  Service: Endoscopy;  Laterality: N/A;  . TEE  WITHOUT CARDIOVERSION N/A 07/05/2016   Procedure: TRANSESOPHAGEAL ECHOCARDIOGRAM (TEE);  Surgeon: Rexene Alberts, MD;  Location: [place];  Service: Open Heart Surgery;  Laterality: N/A;  . TOOTH EXTRACTION    . TUBAL LIGATION    . VEIN LIGATION AND STRIPPING     Social History   Socioeconomic History  . Marital status: Married    Spouse name: Not on file  . Number of children: 2  . Years of education: Not on file  . Highest education level: Not on file  Occupational History  . Not on file  Social Needs  . Financial resource strain: Not on file  . Food insecurity:    Worry: Not on file    Inability: Not on file  . Transportation needs:    Medical: Not on file    Non-medical: Not on file  Tobacco Use  . Smoking status: Never Smoker  . Smokeless tobacco: Never Used  Substance and Sexual Activity  . Alcohol use: No    Alcohol/week: 0.0 standard drinks    Comment: Rarely  . Drug use: No  . Sexual activity: Not on file  Lifestyle  . Physical activity:    Days per week: Not on file    Minutes per session: Not on file  . Stress: Not on file  Relationships  . Social connections:    Talks on phone: Not on file    Gets together: Not on file    Attends religious service: Not on file    Active member of club or organization: Not on file    Attends meetings of clubs or organizations: Not on file    Relationship status: Not on file  Other Topics Concern  . Not on file  Social History Narrative  . Not on file   Outpatient Encounter Medications as of 12/10/2017  Medication Sig  . acetaminophen (TYLENOL) 500 MG tablet Take 1,000 mg by mouth 2 (two) times daily as needed for headache.  . albuterol (PROVENTIL HFA;VENTOLIN HFA) 108 (90 BASE) MCG/ACT inhaler Inhale 2 puffs into the lungs every 6 (six) hours as needed for wheezing.  Marland Kitchen albuterol (PROVENTIL) (2.5 MG/3ML) 0.083% nebulizer solution Inhale 3 mLs into the lungs at bedtime. May inhale a second dose during the day as needed for  shortness of breath  . ALPRAZolam (XANAX) 0.5 MG tablet Take 0.5 mg by mouth 2 (two) times daily as needed (for anxiety/sleep (scheduled at bedtime)).   Marland Kitchen amiodarone (PACERONE) 200 MG tablet Take 1 tablet (200 mg total) by mouth daily. NONE on Sunday  . aspirin EC 81 MG EC tablet Take 1 tablet (81 mg total) by mouth daily.  . calcium carbonate (OS-CAL) 600 MG TABS tablet Take 600 mg by mouth 2 (two) times daily with a meal.  .  Calcium Carbonate-Vitamin D 600-400 MG-UNIT tablet Take 1 tablet by mouth 2 (two) times daily.  . Coenzyme Q10 100 MG TABS Take 100 mg by mouth every evening.   . docusate sodium (COLACE) 100 MG capsule Take 200 mg by mouth daily as needed for mild constipation.   . furosemide (LASIX) 40 MG tablet Take 40 mg by mouth 2 (two) times daily as needed.  . hydrocortisone (ANUSOL-HC) 2.5 % rectal cream Place 1 application rectally 2 (two) times daily. FOR UP TO 10 DAYS AT A TIME (Patient taking differently: Place 1 application rectally 2 (two) times daily as needed for hemorrhoids or itching. FOR UP TO 10 DAYS AT A TIME)  . levothyroxine (SYNTHROID, LEVOTHROID) 75 MCG tablet Take 1 tablet by mouth daily.  . Multiple Vitamin (MULTIVITAMIN) tablet Take 1 tablet by mouth daily.    Marland Kitchen omeprazole (PRILOSEC) 20 MG capsule Take 1 capsule (20 mg total) by mouth daily.  Marland Kitchen PARoxetine (PAXIL) 20 MG tablet Take 20 mg by mouth daily.   Vladimir Faster Glycol-Propyl Glycol (SYSTANE ULTRA) 0.4-0.3 % SOLN Place 1 drop into both eyes daily as needed (dry eyes).   . potassium chloride SA (K-DUR,KLOR-CON) 20 MEQ tablet Take 1 tablet (20 mEq total) by mouth 2 (two) times daily.  . simvastatin (ZOCOR) 40 MG tablet Take 40 mg daily by mouth.  . sodium chloride (OCEAN) 0.65 % SOLN nasal spray Place 1 spray into both nostrils as needed for congestion.  . traMADol (ULTRAM) 50 MG tablet Take 1 tablet (50 mg total) by mouth every 6 (six) hours as needed. (Patient taking differently: Take 50 mg by mouth every 6  (six) hours as needed for moderate pain. )  . warfarin (COUMADIN) 2.5 MG tablet Take 1 tablet daily except 1 1/2 tablets on Sundays and Thursdays or as directed (Patient taking differently: Take 2.5 mgt daily around 6 pm except on Sundays, Tuesdays, and Thursdays take 3.75 mg once daily)   No facility-administered encounter medications on file as of 12/10/2017.    ALLERGIES: Allergies  Allergen Reactions  . Iohexol Hives and Shortness Of Breath     Pt. had a severe allergic reaction to IV contrast the last time she was injected and had to be seen in the ER.   Marland Kitchen Hydrocodone-Acetaminophen Hives  . Nabumetone Rash  . Prednisone Rash    VACCINATION STATUS:  There is no immunization history on file for this patient.  HPI [name] [name] is 77 y.o. female who presents today with a medical history as above. she is being seen in consultation for cushingoid  features requested by Glenda Chroman, MD.  -She denies any prior history of adrenal dysfunction.  She is a poor historian, however denies any exposure to high-dose oral steroids.  She has taken several forms of inhaled steroids for treatment of COPD over the years. -She does not have any specific complaints today.  She has observed recent weight loss of 11 pounds since May 2019, preceded by several years of progressive weight gain.  -She reports easy bruising.  She is on multiple medications including anticoagulation with warfarin related to her cardiac surgery for tricuspid regurgitation as well as atrial fibrillation. -She is on a high-dose amiodarone 200 mg p.o. daily which resulted in hypothyroidism currently on levothyroxine 75 mcg p.o. Nightly.   Review of Systems  Constitutional: + Weight gain followed by weight loss ,  + fatigue, no subjective hyperthermia, no subjective hypothermia Eyes: no blurry vision, no xerophthalmia ENT: no  sore throat, no nodules palpated in throat, no dysphagia/odynophagia, no hoarseness Cardiovascular:  no Chest Pain, +Shortness of Breath with exertion, no palpitations, no leg swelling Respiratory: no cough, no SOB Gastrointestinal: no Nausea/Vomiting/Diarhhea Musculoskeletal: no muscle/joint aches Skin: no rashes Neurological: no tremors, no numbness, no tingling, no dizziness Psychiatric: no depression, no anxiety  Objective:    BP (!) 165/78   Pulse (!) 59   Ht 5\' 2"  (1.575 m)   Wt 137 lb (62.1 kg)   BMI 25.06 kg/m   Wt Readings from Last 3 Encounters:  12/10/17 137 lb (62.1 kg)  11/12/17 158 lb (71.7 kg)  08/22/17 148 lb 9.6 oz (67.4 kg)    Physical Exam  Constitutional: + [place] weight for height, not in acute distress, normal state of mind Eyes: PERRLA, EOMI, no exophthalmos ENT: moist mucous membranes, no thyromegaly, no cervical lymphadenopathy Cardiovascular: normal precordial activity, Regular Rate and Rhythm, no Murmur/Rubs/Gallops Respiratory:  adequate breathing efforts, no gross chest deformity, Clear to auscultation bilaterally Gastrointestinal: abdomen soft, Non -tender, No distension, Bowel Sounds present Musculoskeletal: no gross deformities, strength intact in all four extremities Skin: moist, warm, no rashes Neurological: no tremor with outstretched hands, Deep tendon reflexes normal in all four extremities.  CMP ( most recent) CMP     Component Value Date/Time   NA 144 08/12/2017 1409   K 3.8 08/12/2017 1409   CL 100 (L) 08/12/2017 1409   CO2 27 08/12/2017 1409   GLUCOSE 96 08/12/2017 1409   BUN 15 08/12/2017 1409   CREATININE 1.01 (H) 08/12/2017 1409   CALCIUM 9.2 08/12/2017 1409   PROT 6.7 07/03/2016 1157   ALBUMIN 3.8 07/03/2016 1157   AST 23 07/03/2016 1157   ALT 19 07/03/2016 1157   ALKPHOS 113 07/03/2016 1157   BILITOT 0.5 07/03/2016 1157   GFRNONAA 52 (L) 08/12/2017 1409   GFRAA >60 08/12/2017 1409     Diabetic Labs (most recent): Lab Results  Component Value Date   HGBA1C 5.6 07/03/2016    Lab Results  Component Value Date    TSH 2.700 03/13/2017   TSH 3.327 07/27/2016       Assessment & Plan:   1. Abnormal endocrine laboratory test finding 2.  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 - I have reviewed her available to cream records and clinically evaluated the patient. - Based on reviews, she has amiodarone induced hypothyroidism for which she is on levothyroxine 75 mcg p.o. every morning.  She will not need adjustment at this time.  - We discussed about correct intake of levothyroxine, at fasting, with water, separated by at least 30 minutes from breakfast, and separated by more than 4 hours from calcium, iron, multivitamins, acid reflux medications (PPIs). -Patient is made aware of the fact that thyroid hormone replacement is needed for life, dose to be adjusted by periodic monitoring of thyroid function tests.    -Her clinical presentation is not particularly suspicious for Cushing's syndrome, however she will be offered screening study with 24-hour urine collection for free cortisol and creatinine.  -Had a long discussion with her that diagnosis of Cushing's syndrome will require multiple tests.  Her concurrent treatment with steroids they just inhalers including Advair may give false positive results.  This has to be taken into context during interpretation of her results.  If her results are equivocal, she will be offered low-dose overnight dexamethasone suppression test. -She  will return in 2 weeks to discuss her results.  - I did not initiate any new prescriptions today. - I advised her  to maintain close follow up with Glenda Chroman, MD for primary care needs.   Lorree Millar Sheffer participated in the discussions, expressed understanding, and voiced agreement with the above plans.  All questions were answered to her satisfaction. she is encouraged to contact clinic should she have any questions or concerns  prior to her return visit.  Follow up plan: Return in about 2 weeks (around 12/24/2017) for 24 Hour Urine Free Cortisol and Creatinine.   Glade Lloyd, MD [facility] [address] [place], [place] [address] Phone: [phone]  Fax: [phone]     12/10/2017, 4:36 PM  This note was partially dictated with voice recognition software. Similar sounding words can be transcribed inadequately or may not  be corrected upon review.

## 2017-12-11 LAB — CREATININE, URINE, 24 HOUR
COLLECTION INTERVAL-UCRE24: 24 h
Creatinine, 24H Ur: 456 mg/d — ABNORMAL LOW (ref 600–1800)
Creatinine, Urine: 79.37 mg/dL
URINE TOTAL VOLUME-UCRE24: 575 mL

## 2017-12-12 ENCOUNTER — Ambulatory Visit (INDEPENDENT_AMBULATORY_CARE_PROVIDER_SITE_OTHER): Payer: Medicare Other | Admitting: *Deleted

## 2017-12-12 DIAGNOSIS — Z5181 Encounter for therapeutic drug level monitoring: Secondary | ICD-10-CM | POA: Diagnosis not present

## 2017-12-12 DIAGNOSIS — Z9889 Other specified postprocedural states: Secondary | ICD-10-CM | POA: Diagnosis not present

## 2017-12-12 DIAGNOSIS — I4819 Other persistent atrial fibrillation: Secondary | ICD-10-CM

## 2017-12-12 DIAGNOSIS — I481 Persistent atrial fibrillation: Secondary | ICD-10-CM | POA: Diagnosis not present

## 2017-12-12 LAB — POCT INR: INR: 3 (ref 2.0–3.0)

## 2017-12-12 NOTE — Patient Instructions (Signed)
Description   Continue coumadin 1 tablet daily Recheck in 3 weeks

## 2017-12-16 DIAGNOSIS — S52601D Unspecified fracture of lower end of right ulna, subsequent encounter for closed fracture with routine healing: Secondary | ICD-10-CM | POA: Diagnosis not present

## 2017-12-16 DIAGNOSIS — S52501D Unspecified fracture of the lower end of right radius, subsequent encounter for closed fracture with routine healing: Secondary | ICD-10-CM | POA: Diagnosis not present

## 2017-12-16 LAB — CORTISOL, URINE, FREE
CORTISOL (UR), FREE: 13 ug/(24.h) (ref 6–42)
CORTISOL,F,UG/L,U: 22 ug/L
TOTAL VOLUME: 575

## 2017-12-17 DIAGNOSIS — J449 Chronic obstructive pulmonary disease, unspecified: Secondary | ICD-10-CM | POA: Diagnosis not present

## 2017-12-17 DIAGNOSIS — K449 Diaphragmatic hernia without obstruction or gangrene: Secondary | ICD-10-CM | POA: Diagnosis not present

## 2017-12-17 DIAGNOSIS — D124 Benign neoplasm of descending colon: Secondary | ICD-10-CM | POA: Diagnosis not present

## 2017-12-17 DIAGNOSIS — I700 Atherosclerosis of aorta: Secondary | ICD-10-CM | POA: Diagnosis not present

## 2017-12-17 DIAGNOSIS — R809 Proteinuria, unspecified: Secondary | ICD-10-CM | POA: Diagnosis not present

## 2017-12-17 DIAGNOSIS — J90 Pleural effusion, not elsewhere classified: Secondary | ICD-10-CM | POA: Diagnosis not present

## 2017-12-17 DIAGNOSIS — R9389 Abnormal findings on diagnostic imaging of other specified body structures: Secondary | ICD-10-CM | POA: Diagnosis not present

## 2017-12-17 DIAGNOSIS — I517 Cardiomegaly: Secondary | ICD-10-CM | POA: Diagnosis not present

## 2017-12-17 DIAGNOSIS — R591 Generalized enlarged lymph nodes: Secondary | ICD-10-CM | POA: Diagnosis not present

## 2017-12-17 DIAGNOSIS — R601 Generalized edema: Secondary | ICD-10-CM | POA: Diagnosis not present

## 2017-12-17 DIAGNOSIS — K7460 Unspecified cirrhosis of liver: Secondary | ICD-10-CM | POA: Diagnosis not present

## 2017-12-20 DIAGNOSIS — R935 Abnormal findings on diagnostic imaging of other abdominal regions, including retroperitoneum: Secondary | ICD-10-CM | POA: Diagnosis not present

## 2017-12-20 DIAGNOSIS — R591 Generalized enlarged lymph nodes: Secondary | ICD-10-CM | POA: Diagnosis not present

## 2017-12-20 DIAGNOSIS — Z6827 Body mass index (BMI) 27.0-27.9, adult: Secondary | ICD-10-CM | POA: Diagnosis not present

## 2017-12-20 DIAGNOSIS — R978 Other abnormal tumor markers: Secondary | ICD-10-CM | POA: Diagnosis not present

## 2017-12-26 ENCOUNTER — Ambulatory Visit: Payer: Medicare Other | Admitting: Nurse Practitioner

## 2017-12-27 ENCOUNTER — Other Ambulatory Visit (HOSPITAL_COMMUNITY): Payer: Self-pay | Admitting: Oncology

## 2017-12-27 DIAGNOSIS — R935 Abnormal findings on diagnostic imaging of other abdominal regions, including retroperitoneum: Secondary | ICD-10-CM

## 2017-12-30 DIAGNOSIS — S52501D Unspecified fracture of the lower end of right radius, subsequent encounter for closed fracture with routine healing: Secondary | ICD-10-CM | POA: Diagnosis not present

## 2017-12-30 DIAGNOSIS — S52601D Unspecified fracture of lower end of right ulna, subsequent encounter for closed fracture with routine healing: Secondary | ICD-10-CM | POA: Diagnosis not present

## 2018-01-06 ENCOUNTER — Ambulatory Visit (INDEPENDENT_AMBULATORY_CARE_PROVIDER_SITE_OTHER): Payer: Medicare Other | Admitting: "Endocrinology

## 2018-01-06 ENCOUNTER — Telehealth: Payer: Self-pay | Admitting: *Deleted

## 2018-01-06 ENCOUNTER — Encounter: Payer: Self-pay | Admitting: "Endocrinology

## 2018-01-06 VITALS — BP 94/53 | HR 46 | Ht 62.0 in | Wt 153.0 lb

## 2018-01-06 DIAGNOSIS — E032 Hypothyroidism due to medicaments and other exogenous substances: Secondary | ICD-10-CM

## 2018-01-06 DIAGNOSIS — R591 Generalized enlarged lymph nodes: Secondary | ICD-10-CM | POA: Diagnosis not present

## 2018-01-06 NOTE — Progress Notes (Signed)
Endocrinology follow up Note                                            01/06/2018, 4:49 PM   Subjective:    Patient ID: [name] [name], female    DOB: 1940/08/02, PCP Glenda Chroman, MD   Past Medical History:  Diagnosis Date  . Anxiety   . Arthritis   . Asthma   . Atrial fibrillation, persistent ([place])   . Chronic diastolic congestive heart failure ([place])   . Colon adenoma   . Colon cancer ([place])    status post low anterior resection, limited stage disease requiring no adjuvant therapy  . COPD (chronic obstructive pulmonary disease) ([place])   . Depression   . Diverticulosis   . DM (dermatomyositis)   . DVT (deep venous thrombosis) ([name])    in leg- long time ago  . Dyspnea    with activity  . Dysrhythmia   . Esophageal dysphagia   . GERD (gastroesophageal reflux disease)   . Headache   . Heart murmur   . Hematuria   . Hemorrhoids   . Hiatal hernia   . History of kidney stones    x 2  . Hypercholesterolemia   . Incidental pulmonary nodule 05/08/2016   8 mm vague opacity RML noted on CT scan  . Mitral regurgitation   . PONV (postoperative nausea and vomiting) 2003 ish    with breast biopsy  . S/P minimally invasive maze operation for atrial fibrillation 07/05/2016   Complete bilateral atrial lesion set using cryothermy and bipolar radiofrequency ablation with clipping of LA appendage via right mini thoracotomy approach  . 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 Schatzki's ring   . Tricuspid regurgitation    Past Surgical History:  Procedure Laterality Date  . APPENDECTOMY    . BREAST SURGERY Right 2003ish   biopsy  . CARDIAC CATHETERIZATION N/A 04/12/2016   Procedure: Right/Left Heart Cath and Coronary Angiography;  Surgeon: Leonie Man, MD;  Location: [facility];  Service:  Cardiovascular;  Laterality: N/A;  . CARDIOVERSION N/A 08/16/2017   Procedure: CARDIOVERSION;  Surgeon: Arnoldo Lenis, MD;  Location: [name];  Service: Endoscopy;  Laterality: N/A;  . CATARACT EXTRACTION Bilateral 2017  . CLIPPING OF ATRIAL APPENDAGE  07/05/2016   Procedure: CLIPPING OF ATRIAL APPENDAGE;  Surgeon: Rexene Alberts, MD;  Location: [facility];  Service: Open Heart Surgery;;  . COLONOSCOPY  11/09   Dr. Gala Romney- external hemorrhoidal tags o/w normal rectal mucosa, s/p surgical resection with a normal appearing anastomosis 12cm, pan colonic diverticulum  . COLONOSCOPY N/A 06/02/2012   [facility]D post low anterior resection. Pancolonic diverticulosis. Colonic polyp-tubular adenoma. Surveillance due 2019.   Marland Kitchen COLONOSCOPY N/A 10/13/2015   Procedure: COLONOSCOPY;  Surgeon: Daneil Dolin, MD;  [name];  Service: Endoscopy;  Laterality: N/A;  0930  . ESOPHAGOGASTRODUODENOSCOPY  07/2007   Dr. Daiva Nakayama cervical esophageal web, schatzki ring, large hiatal hernia  . INGUNAL HERNIA REPAIR Left   . LOW ANTERIOR BOWEL RESECTION     NO ADJ CHEMO  . MINIMALLY INVASIVE MAZE PROCEDURE N/A 07/05/2016   Procedure: MINIMALLY INVASIVE MAZE PROCEDURE;  Surgeon: Valentina Gu  Roxy Manns, MD;  Location: [facility];  Service: Open Heart Surgery;  Laterality: N/A;  . MITRAL VALVE REPAIR Right 07/05/2016   Procedure: MINIMALLY INVASIVE MITRAL VALVE REPAIR (MVR);  Surgeon: Rexene Alberts, MD;  Location: [facility];  Service: Open Heart Surgery;  Laterality: Right;  . MULTIPLE EXTRACTIONS WITH ALVEOLOPLASTY N/A 05/21/2016   Procedure: MULTIPLE EXTRACTION OF TOOTH #'S 5, 21 WITH ALVEOLOPLASTY AND GROSS DEBRIDEMENT OF TEETH;  Surgeon: Lenn Cal, DDS;  Location: [facility];  Service: Oral Surgery;  Laterality: N/A;  . TEE WITHOUT CARDIOVERSION N/A 02/20/2016   Procedure: TRANSESOPHAGEAL ECHOCARDIOGRAM (TEE);  Surgeon: Arnoldo Lenis, MD;  [name];  Service: Endoscopy;  Laterality: N/A;  . TEE  WITHOUT CARDIOVERSION N/A 07/05/2016   Procedure: TRANSESOPHAGEAL ECHOCARDIOGRAM (TEE);  Surgeon: Rexene Alberts, MD;  Location: [facility];  Service: Open Heart Surgery;  Laterality: N/A;  . TOOTH EXTRACTION    . TUBAL LIGATION    . VEIN LIGATION AND STRIPPING     Social History   Socioeconomic History  . Marital status: Married    Spouse name: Not on file  . Number of children: 2  . Years of education: Not on file  . Highest education level: Not on file  Occupational History  . Not on file  Social Needs  . Financial resource strain: Not on file  . Food insecurity:    Worry: Not on file    Inability: Not on file  . Transportation needs:    Medical: Not on file    Non-medical: Not on file  Tobacco Use  . Smoking status: Never Smoker  . Smokeless tobacco: Never Used  Substance and Sexual Activity  . Alcohol use: No    Alcohol/week: 0.0 standard drinks    Comment: Rarely  . Drug use: No  . Sexual activity: Not on file  Lifestyle  . Physical activity:    Days per week: Not on file    Minutes per session: Not on file  . Stress: Not on file  Relationships  . Social connections:    Talks on phone: Not on file    Gets together: Not on file    Attends religious service: Not on file    Active member of club or organization: Not on file    Attends meetings of clubs or organizations: Not on file    Relationship status: Not on file  Other Topics Concern  . Not on file  Social History Narrative  . Not on file   Outpatient Encounter Medications as of 01/06/2018  Medication Sig  . acetaminophen (TYLENOL) 500 MG tablet Take 1,000 mg by mouth 2 (two) times daily as needed for headache.  . albuterol (PROVENTIL HFA;VENTOLIN HFA) 108 (90 BASE) MCG/ACT inhaler Inhale 2 puffs into the lungs every 6 (six) hours as needed for wheezing.  Marland Kitchen albuterol (PROVENTIL) (2.5 MG/3ML) 0.083% nebulizer solution Inhale 3 mLs into the lungs at bedtime. May inhale a second dose during the day as needed for  shortness of breath  . ALPRAZolam (XANAX) 0.5 MG tablet Take 0.5 mg by mouth 2 (two) times daily as needed (for anxiety/sleep (scheduled at bedtime)).   Marland Kitchen amiodarone (PACERONE) 200 MG tablet Take 1 tablet (200 mg total) by mouth daily. NONE on Sunday  . aspirin EC 81 MG EC tablet Take 1 tablet (81 mg total) by mouth daily.  . calcium carbonate (OS-CAL) 600 MG TABS tablet Take 600 mg by mouth 2 (two) times daily with a meal.  .  Calcium Carbonate-Vitamin D 600-400 MG-UNIT tablet Take 1 tablet by mouth 2 (two) times daily.  . Coenzyme Q10 100 MG TABS Take 100 mg by mouth every evening.   . docusate sodium (COLACE) 100 MG capsule Take 200 mg by mouth daily as needed for mild constipation.   . furosemide (LASIX) 40 MG tablet Take 40 mg by mouth 2 (two) times daily as needed.  . hydrocortisone (ANUSOL-HC) 2.5 % rectal cream Place 1 application rectally 2 (two) times daily. FOR UP TO 10 DAYS AT A TIME (Patient taking differently: Place 1 application rectally 2 (two) times daily as needed for hemorrhoids or itching. FOR UP TO 10 DAYS AT A TIME)  . levothyroxine (SYNTHROID, LEVOTHROID) 75 MCG tablet Take 1 tablet by mouth daily.  . Multiple Vitamin (MULTIVITAMIN) tablet Take 1 tablet by mouth daily.    Marland Kitchen omeprazole (PRILOSEC) 20 MG capsule Take 1 capsule (20 mg total) by mouth daily.  Marland Kitchen PARoxetine (PAXIL) 20 MG tablet Take 20 mg by mouth daily.   Vladimir Faster Glycol-Propyl Glycol (SYSTANE ULTRA) 0.4-0.3 % SOLN Place 1 drop into both eyes daily as needed (dry eyes).   . potassium chloride SA (K-DUR,KLOR-CON) 20 MEQ tablet Take 1 tablet (20 mEq total) by mouth 2 (two) times daily.  . simvastatin (ZOCOR) 40 MG tablet Take 40 mg daily by mouth.  . sodium chloride (OCEAN) 0.65 % SOLN nasal spray Place 1 spray into both nostrils as needed for congestion.  . traMADol (ULTRAM) 50 MG tablet Take 1 tablet (50 mg total) by mouth every 6 (six) hours as needed. (Patient taking differently: Take 50 mg by mouth every 6  (six) hours as needed for moderate pain. )  . warfarin (COUMADIN) 2.5 MG tablet Take 1 tablet daily except 1 1/2 tablets on Sundays and Thursdays or as directed (Patient taking differently: Take 2.5 mgt daily around 6 pm except on Sundays, Tuesdays, and Thursdays take 3.75 mg once daily)   No facility-administered encounter medications on file as of 01/06/2018.    ALLERGIES: Allergies  Allergen Reactions  . Iohexol Hives and Shortness Of Breath     Pt. had a severe allergic reaction to IV contrast the last time she was injected and had to be seen in the ER.   Marland Kitchen Hydrocodone-Acetaminophen Hives  . Nabumetone Rash  . Prednisone Rash    VACCINATION STATUS:  There is no immunization history on file for this patient.  HPI WALDINE ZENZ is 77 y.o. female who presents today with her labs results after she was seen in consultation for  cushingoid  features requested by Glenda Chroman, MD.  -She denies any prior history of adrenal dysfunction.  She is a poor historian, however denies any exposure to high-dose oral steroids.  She has taken several forms of inhaled steroids for treatment of COPD over the years. -She does not have any specific complaints today.  -She is regaining the weight she lost unintentionally in the recent weeks.   -She reports easy bruising.  She is on multiple medications including anticoagulation with warfarin related to her cardiac surgery for tricuspid regurgitation as well as atrial fibrillation. -She is on a high-dose amiodarone 200 mg p.o. daily which resulted in hypothyroidism currently on levothyroxine 75 mcg p.o. Nightly.   Review of Systems  Constitutional: + Weight gain ,  + fatigue, no subjective hyperthermia, no subjective hypothermia Eyes: no blurry vision, no xerophthalmia ENT: no sore throat, no nodules palpated in throat, no dysphagia/odynophagia, no hoarseness Cardiovascular:  no Chest Pain, +Shortness of Breath with exertion, no palpitations, no leg  swelling Respiratory: no cough, + SOB Gastrointestinal: no Nausea/Vomiting/Diarhhea Musculoskeletal: no muscle/joint aches Skin: no rashes Neurological: no tremors, no numbness, no tingling, no dizziness Psychiatric: no depression, no anxiety  Objective:    BP (!) 94/53   Pulse (!) 46   Ht 5\' 2"  (1.575 m)   Wt 153 lb (69.4 kg)   BMI 27.98 kg/m   Wt Readings from Last 3 Encounters:  01/06/18 153 lb (69.4 kg)  12/10/17 137 lb (62.1 kg)  11/12/17 158 lb (71.7 kg)    Physical Exam  Constitutional: + Weight gain,  not in acute distress, normal state of mind Eyes: PERRLA, EOMI, no exophthalmos ENT: moist mucous membranes, no thyromegaly, no cervical lymphadenopathy  Musculoskeletal: no gross deformities, strength intact in all four extremities Skin: moist, warm, no rashes Neurological: no tremor with outstretched hands, Deep tendon reflexes normal in all four extremities.  CMP ( most recent) CMP     Component Value Date/Time   NA 144 08/12/2017 1409   K 3.8 08/12/2017 1409   CL 100 (L) 08/12/2017 1409   CO2 27 08/12/2017 1409   GLUCOSE 96 08/12/2017 1409   BUN 15 08/12/2017 1409   CREATININE 1.01 (H) 08/12/2017 1409   CALCIUM 9.2 08/12/2017 1409   PROT 6.7 07/03/2016 1157   ALBUMIN 3.8 07/03/2016 1157   AST 23 07/03/2016 1157   ALT 19 07/03/2016 1157   ALKPHOS 113 07/03/2016 1157   BILITOT 0.5 07/03/2016 1157   GFRNONAA 52 (L) 08/12/2017 1409   GFRAA >60 08/12/2017 1409     Diabetic Labs (most recent): Lab Results  Component Value Date   HGBA1C 5.6 07/03/2016    Lab Results  Component Value Date   TSH 2.700 03/13/2017   TSH 3.327 07/27/2016    Recent Results (from the past 2160 hour(s))  POCT INR     Status: Abnormal   Collection Time: 10/10/17 12:57 PM  Result Value Ref Range   INR 5.4 (A) 2.0 - 3.0  POCT INR     Status: Abnormal   Collection Time: 10/17/17  1:24 PM  Result Value Ref Range   INR 1.4 (A) 2.0 - 3.0  POCT INR     Status: Abnormal    Collection Time: 10/25/17  1:05 PM  Result Value Ref Range   INR 2.3 2.0 - 3.0  POCT INR     Status: Normal   Collection Time: 11/06/17  4:32 PM  Result Value Ref Range   INR 2.4 2.0 - 3.0  POCT INR     Status: Abnormal   Collection Time: 11/21/17  8:54 AM  Result Value Ref Range   INR 1.3 (A) 2.0 - 3.0  POCT INR     Status: Normal   Collection Time: 11/28/17 10:14 AM  Result Value Ref Range   INR 2.0 2.0 - 3.0  Creatinine, urine, 24 hour     Status: Abnormal   Collection Time: 12/11/17  4:28 PM  Result Value Ref Range   Urine Total Volume-UCRE24 575 mL   Collection Interval-UCRE24 24 hours   Creatinine, Urine 79.37 mg/dL   Creatinine, 24H Ur 456 (L) 600 - 1,800 mg/day    Comment: Performed at [facility], [address], [place], [place] [address]  Cortisol, urine, free     Status: None   Collection Time: 12/11/17  4:28 PM  Result Value Ref Range   Cortisol (Ur), Free 13 6 -  42 ug/24 hr    Comment: (NOTE) Performed At: [facility] [place], [place] [id number] Rush Farmer MD [id number]    Total Volume 575     Comment: Performed at [facility], [address], [place], [place] [address]   Cortisol,F,ug/L,U 22 Undefined ug/L    Comment: (NOTE) This test was developed and its performance characteristics determined by [facility]. It has not been cleared or approved by the Food and Drug Administration.   POCT INR     Status: None   Collection Time: 12/12/17 10:03 AM  Result Value Ref Range   INR 3.0 2.0 - 3.0      Assessment & Plan:   1.  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  -Her 24-hour urine free cortisol is within normal limits ruling out Cushing's syndrome despite her history of exposure to inhaled steroids to treat COPD. -She will not require any adrenal intervention at this time.  She does so many nonspecific symptoms related to her other several  comorbidities.    Regarding her hypothyroidism likely induced by amiodarone treatment.  I advised her to continue levothyroxine 75 mcg p.o. every morning. - - We discussed about correct intake of levothyroxine, at fasting, with water, separated by at least 30 minutes from breakfast, and separated by more than 4 hours from calcium, iron, multivitamins, acid reflux medications (PPIs). -Patient is made aware of the fact that thyroid hormone replacement is needed for life, dose to be adjusted by periodic monitoring of thyroid function tests.  - I did not initiate any new prescriptions today. - I advised her  to maintain close follow up with Glenda Chroman, MD for primary care needs.   Vera Furniss Bucio participated in the discussions, expressed understanding, and voiced agreement with the above plans.  All questions were answered to her satisfaction. she is encouraged to contact clinic should she have any questions or concerns prior to her return visit.  Follow up plan: Return in about 6 months (around 07/07/2018) for Follow up with Pre-visit Labs.   Glade Lloyd, MD [facility] [address] [place], [place] [address] Phone: [phone]  Fax: [phone]     01/06/2018, 4:49 PM  This note was partially dictated with voice recognition software. Similar sounding words can be transcribed inadequately or may not  be corrected upon review.

## 2018-01-06 NOTE — Telephone Encounter (Signed)
[name] [name] with [facility] - just wanted to report low heart rate on patient today in their office.    110/57  48  96%

## 2018-01-07 ENCOUNTER — Ambulatory Visit (INDEPENDENT_AMBULATORY_CARE_PROVIDER_SITE_OTHER): Payer: Medicare Other | Admitting: *Deleted

## 2018-01-07 DIAGNOSIS — I4819 Other persistent atrial fibrillation: Secondary | ICD-10-CM

## 2018-01-07 DIAGNOSIS — I481 Persistent atrial fibrillation: Secondary | ICD-10-CM | POA: Diagnosis not present

## 2018-01-07 DIAGNOSIS — Z5181 Encounter for therapeutic drug level monitoring: Secondary | ICD-10-CM

## 2018-01-07 DIAGNOSIS — Z9889 Other specified postprocedural states: Secondary | ICD-10-CM

## 2018-01-07 LAB — POCT INR: INR: 1.9 — AB (ref 2.0–3.0)

## 2018-01-07 NOTE — Patient Instructions (Signed)
Increase coumadin to 1 tablet daily except 1 1/2 tablets on Tuesdays Recheck in 3 weeks

## 2018-01-08 ENCOUNTER — Ambulatory Visit (HOSPITAL_COMMUNITY)
Admission: RE | Admit: 2018-01-08 | Discharge: 2018-01-08 | Disposition: A | Discharge status: Home / Self Care | Payer: Medicare Other | Source: Ambulatory Visit | Attending: Oncology | Admitting: Oncology

## 2018-01-08 DIAGNOSIS — R978 Other abnormal tumor markers: Secondary | ICD-10-CM | POA: Diagnosis not present

## 2018-01-08 DIAGNOSIS — R590 Localized enlarged lymph nodes: Secondary | ICD-10-CM | POA: Diagnosis not present

## 2018-01-08 DIAGNOSIS — R935 Abnormal findings on diagnostic imaging of other abdominal regions, including retroperitoneum: Secondary | ICD-10-CM | POA: Insufficient documentation

## 2018-01-08 DIAGNOSIS — R591 Generalized enlarged lymph nodes: Secondary | ICD-10-CM | POA: Diagnosis not present

## 2018-01-08 DIAGNOSIS — J90 Pleural effusion, not elsewhere classified: Secondary | ICD-10-CM | POA: Diagnosis not present

## 2018-01-08 DIAGNOSIS — R601 Generalized edema: Secondary | ICD-10-CM | POA: Insufficient documentation

## 2018-01-08 LAB — GLUCOSE, CAPILLARY: Glucose-Capillary: 85 mg/dL (ref 70–99)

## 2018-01-08 MED ORDER — FLUDEOXYGLUCOSE F - 18 (FDG) INJECTION
7.8800 | Freq: Once | INTRAVENOUS | Status: AC | PRN
  Administered 2018-01-08: 7.88 via INTRAVENOUS

## 2018-01-13 ENCOUNTER — Ambulatory Visit (INDEPENDENT_AMBULATORY_CARE_PROVIDER_SITE_OTHER): Payer: Medicare Other | Admitting: Nurse Practitioner

## 2018-01-13 ENCOUNTER — Encounter: Payer: Self-pay | Admitting: Nurse Practitioner

## 2018-01-13 VITALS — BP 152/70 | HR 44 | Temp 96.9°F | Ht 62.0 in | Wt 150.6 lb

## 2018-01-13 DIAGNOSIS — N183 Chronic kidney disease, stage 3 (moderate): Secondary | ICD-10-CM | POA: Diagnosis not present

## 2018-01-13 DIAGNOSIS — I4891 Unspecified atrial fibrillation: Secondary | ICD-10-CM | POA: Diagnosis not present

## 2018-01-13 DIAGNOSIS — R609 Edema, unspecified: Secondary | ICD-10-CM | POA: Diagnosis not present

## 2018-01-13 DIAGNOSIS — Z299 Encounter for prophylactic measures, unspecified: Secondary | ICD-10-CM | POA: Diagnosis not present

## 2018-01-13 DIAGNOSIS — K625 Hemorrhage of anus and rectum: Secondary | ICD-10-CM | POA: Diagnosis not present

## 2018-01-13 DIAGNOSIS — K219 Gastro-esophageal reflux disease without esophagitis: Secondary | ICD-10-CM

## 2018-01-13 DIAGNOSIS — Z23 Encounter for immunization: Secondary | ICD-10-CM | POA: Diagnosis not present

## 2018-01-13 DIAGNOSIS — J449 Chronic obstructive pulmonary disease, unspecified: Secondary | ICD-10-CM | POA: Diagnosis not present

## 2018-01-13 DIAGNOSIS — K649 Unspecified hemorrhoids: Secondary | ICD-10-CM | POA: Diagnosis not present

## 2018-01-13 DIAGNOSIS — Z6821 Body mass index (BMI) 21.0-21.9, adult: Secondary | ICD-10-CM | POA: Diagnosis not present

## 2018-01-13 NOTE — Progress Notes (Signed)
Referring Provider: Glenda Chroman, MD Primary Care Physician:  Glenda Chroman, MD Primary GI:  Dr. Gala Romney  Chief Complaint  Patient presents with  . Hemorrhoids    bleeding    HPI:   [name] [name] is a 77 y.o. female who presents for follow-up on hemorrhoids and rectal bleeding.  Patient was last seen in our office 07/18/2017 for hemorrhoids, rectal bleeding, weight loss.  Colonoscopy up-to-date 2017 recommend repeat in 2020 if health permits due to a semi-pedunculated 5 mm sessile polyp found to be serrated polyp/adenoma without cytologic dysplasia.  At her last visit she is doing okay overall.  Apothecary cream did not help much.  Prefers sitz bath/warm water and still with hemorrhoid bleeding about once weekly, mostly on the toilet tissue.  Some recent weight loss due to "so-so" appetite and "I do not care if I eat or not."  Some recent nausea may have affected her intake which lasted about 2 weeks.  Nausea resolved at the time of visit.  Grade 3/4 hemorrhoids on last colonoscopy not described as internal versus external, is on anticoagulation and likely not a candidate for hemorrhoid banding.  Recommended continue current treatments including rectal cream and warm water baths, avoid constipation (call our office if assistance needed), start a nutrition supplement at least once a day, follow-up in 3 months.  Today she states she's doing well overall. Her hemorrhoids are intermittently worse then before. Hard to keep clean and some discomfort. Has a bowel movement about daily, may skip a day or two here and there. Takes MiraLAX is she's getting constipated which she thinks helps. Still with some straining, but stools aren't hard. Drinks "not enough" water, eats "not as much as I should" fiber although she does like them. She's trying an OTC cream for hemorrhoids which helps the discomfort. Has hemorrhoid bleeding intermittently once every 1-2 weeks. She has an altered sleep schedule, at times  will sleep a large part of the day and then have difficulty going to sleep at night. GERD doing well on PPI. Denies abdominal pain, N/V, melena. Denies chest pain, dyspnea, dizziness, lightheadedness, syncope, near syncope. Denies any other upper or lower GI symptoms.  Past Medical History:  Diagnosis Date  . Anxiety   . Arthritis   . Asthma   . Atrial fibrillation, persistent ([place])   . Chronic diastolic congestive heart failure ([place])   . Colon adenoma   . Colon cancer ([place])    status post low anterior resection, limited stage disease requiring no adjuvant therapy  . COPD (chronic obstructive pulmonary disease) ([place])   . Depression   . Diverticulosis   . DM (dermatomyositis)   . DVT (deep venous thrombosis) ([name])    in leg- long time ago  . Dyspnea    with activity  . Dysrhythmia   . Esophageal dysphagia   . GERD (gastroesophageal reflux disease)   . Headache   . Heart murmur   . Hematuria   . Hemorrhoids   . Hiatal hernia   . History of kidney stones    x 2  . Hypercholesterolemia   . Incidental pulmonary nodule 05/08/2016   8 mm vague opacity RML noted on CT scan  . Mitral regurgitation   . PONV (postoperative nausea and vomiting) 2003 ish    with breast biopsy  . S/P minimally invasive maze operation for atrial fibrillation 07/05/2016   Complete bilateral atrial lesion set using cryothermy and bipolar radiofrequency ablation with clipping of LA  appendage via right mini thoracotomy approach  . 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 Schatzki's ring   . Tricuspid regurgitation     Past Surgical History:  Procedure Laterality Date  . APPENDECTOMY    . BREAST SURGERY Right 2003ish   biopsy  . CARDIAC CATHETERIZATION N/A 04/12/2016   Procedure: Right/Left Heart Cath and Coronary Angiography;  Surgeon:  Leonie Man, MD;  Location: [facility];  Service: Cardiovascular;  Laterality: N/A;  . CARDIOVERSION N/A 08/16/2017   Procedure: CARDIOVERSION;  Surgeon: Arnoldo Lenis, MD;  Location: [name];  Service: Endoscopy;  Laterality: N/A;  . CATARACT EXTRACTION Bilateral 2017  . CLIPPING OF ATRIAL APPENDAGE  07/05/2016   Procedure: CLIPPING OF ATRIAL APPENDAGE;  Surgeon: Rexene Alberts, MD;  Location: [facility];  Service: Open Heart Surgery;;  . COLONOSCOPY  11/09   Dr. Gala Romney- external hemorrhoidal tags o/w normal rectal mucosa, s/p surgical resection with a normal appearing anastomosis 12cm, pan colonic diverticulum  . COLONOSCOPY N/A 06/02/2012   DQQ:IWLNLG post low anterior resection. Pancolonic diverticulosis. Colonic polyp-tubular adenoma. Surveillance due 2019.   Marland Kitchen COLONOSCOPY N/A 10/13/2015   Procedure: COLONOSCOPY;  Surgeon: Daneil Dolin, MD;  Location: [name];  Service: Endoscopy;  Laterality: N/A;  0930  . ESOPHAGOGASTRODUODENOSCOPY  07/2007   Dr. Daiva Nakayama cervical esophageal web, schatzki ring, large hiatal hernia  . INGUNAL HERNIA REPAIR Left   . LOW ANTERIOR BOWEL RESECTION     NO ADJ CHEMO  . MINIMALLY INVASIVE MAZE PROCEDURE N/A 07/05/2016   Procedure: MINIMALLY INVASIVE MAZE PROCEDURE;  Surgeon: Rexene Alberts, MD;  Location: [facility];  Service: Open Heart Surgery;  Laterality: N/A;  . MITRAL VALVE REPAIR Right 07/05/2016   Procedure: MINIMALLY INVASIVE MITRAL VALVE REPAIR (MVR);  Surgeon: Rexene Alberts, MD;  Location: [facility];  Service: Open Heart Surgery;  Laterality: Right;  . MULTIPLE EXTRACTIONS WITH ALVEOLOPLASTY N/A 05/21/2016   Procedure: MULTIPLE EXTRACTION OF TOOTH #'S 5, 21 WITH ALVEOLOPLASTY AND GROSS DEBRIDEMENT OF TEETH;  Surgeon: Lenn Cal, DDS;  Location: [facility];  Service: Oral Surgery;  Laterality: N/A;  . TEE WITHOUT CARDIOVERSION N/A 02/20/2016   Procedure: TRANSESOPHAGEAL ECHOCARDIOGRAM (TEE);  Surgeon: Arnoldo Lenis, MD;  [facility];  Service: Endoscopy;  Laterality: N/A;  . TEE WITHOUT CARDIOVERSION N/A 07/05/2016   Procedure: TRANSESOPHAGEAL ECHOCARDIOGRAM (TEE);  Surgeon: Rexene Alberts, MD;  Location: [facility];  Service: Open Heart Surgery;  Laterality: N/A;  . TOOTH EXTRACTION    . TUBAL LIGATION    . VEIN LIGATION AND STRIPPING      Current Outpatient Medications  Medication Sig Dispense Refill  . acetaminophen (TYLENOL) 500 MG tablet Take 1,000 mg by mouth 2 (two) times daily as needed for headache.    . albuterol (PROVENTIL HFA;VENTOLIN HFA) 108 (90 BASE) MCG/ACT inhaler Inhale 2 puffs into the lungs every 6 (six) hours as needed for wheezing.    Marland Kitchen albuterol (PROVENTIL) (2.5 MG/3ML) 0.083% nebulizer solution Inhale 3 mLs into the lungs at bedtime. May inhale a second dose during the day as needed for shortness of breath  12  . ALPRAZolam (XANAX) 0.5 MG tablet Take 0.5 mg by mouth 2 (two) times daily as needed (for anxiety/sleep (scheduled at bedtime)).     Marland Kitchen amiodarone (PACERONE) 200 MG tablet Take 1  tablet (200 mg total) by mouth daily. NONE on Sunday 90 tablet 3  . aspirin EC 81 MG EC tablet Take 1 tablet (81 mg total) by mouth daily.    . calcium carbonate (OS-CAL) 600 MG TABS tablet Take 600 mg by mouth 2 (two) times daily with a meal.    . Calcium Carbonate-Vitamin D 600-400 MG-UNIT tablet Take 1 tablet by mouth 2 (two) times daily.    . Coenzyme Q10 100 MG TABS Take 100 mg by mouth every evening.     . docusate sodium (COLACE) 100 MG capsule Take 200 mg by mouth daily as needed for mild constipation.     . furosemide (LASIX) 40 MG tablet Take 40 mg by mouth 2 (two) times daily as needed.    . hydrocortisone (ANUSOL-HC) 2.5 % rectal cream Place 1 application rectally 2 (two) times daily. FOR UP TO 10 DAYS AT A TIME (Patient taking differently: Place 1 application rectally 2 (two) times daily as needed for hemorrhoids or itching. FOR UP TO 10 DAYS AT A TIME) 30 g 1  . levothyroxine (SYNTHROID,  LEVOTHROID) 75 MCG tablet Take 1 tablet by mouth daily.  4  . Multiple Vitamin (MULTIVITAMIN) tablet Take 1 tablet by mouth daily.      Marland Kitchen omeprazole (PRILOSEC) 20 MG capsule Take 1 capsule (20 mg total) by mouth daily. 30 capsule 3  . PARoxetine (PAXIL) 20 MG tablet Take 20 mg by mouth daily.     Vladimir Faster Glycol-Propyl Glycol (SYSTANE ULTRA) 0.4-0.3 % SOLN Place 1 drop into both eyes daily as needed (dry eyes).     . potassium chloride SA (K-DUR,KLOR-CON) 20 MEQ tablet Take 1 tablet (20 mEq total) by mouth 2 (two) times daily. 180 tablet 3  . simvastatin (ZOCOR) 40 MG tablet Take 40 mg daily by mouth.    . sodium chloride (OCEAN) 0.65 % SOLN nasal spray Place 1 spray into both nostrils as needed for congestion.    . traMADol (ULTRAM) 50 MG tablet Take 1 tablet (50 mg total) by mouth every 6 (six) hours as needed. (Patient taking differently: Take 50 mg by mouth every 6 (six) hours as needed for moderate pain. ) 15 tablet 0  . warfarin (COUMADIN) 2.5 MG tablet Take 1 tablet daily except 1 1/2 tablets on Sundays and Thursdays or as directed (Patient taking differently: Take 2.5 mg daily, except Tues 3.75mg ) 100 tablet 3   No current facility-administered medications for this visit.     Allergies as of 01/13/2018 - Review Complete 01/13/2018  Allergen Reaction Noted  . Iohexol Hives and Shortness Of Breath 07/16/2008  . Hydrocodone-acetaminophen Hives 04/26/2010  . Nabumetone Rash 04/26/2010  . Prednisone Rash 10/13/2015    Family History  Problem Relation Age of Onset  . Breast cancer Mother   . Rheum arthritis Father   . Cancer - Lung Sister   . Brain cancer Sister   . Prostate cancer Brother   . Aneurysm Brother   . Colon cancer Neg Hx     Social History   Socioeconomic History  . Marital status: Married    Spouse name: Not on file  . Number of children: 2  . Years of education: Not on file  . Highest education level: Not on file  Occupational History  . Not on file    Social Needs  . Financial resource strain: Not on file  . Food insecurity:    Worry: Not on file    Inability: Not on  file  . Transportation needs:    Medical: Not on file    Non-medical: Not on file  Tobacco Use  . Smoking status: Never Smoker  . Smokeless tobacco: Never Used  Substance and Sexual Activity  . Alcohol use: No    Alcohol/week: 0.0 standard drinks  . Drug use: No  . Sexual activity: Not on file  Lifestyle  . Physical activity:    Days per week: Not on file    Minutes per session: Not on file  . Stress: Not on file  Relationships  . Social connections:    Talks on phone: Not on file    Gets together: Not on file    Attends religious service: Not on file    Active member of club or organization: Not on file    Attends meetings of clubs or organizations: Not on file    Relationship status: Not on file  Other Topics Concern  . Not on file  Social History Narrative  . Not on file    Review of Systems: General: Negative for anorexia, weight loss, fever, chills, fatigue, weakness. ENT: Negative for hoarseness, difficulty swallowing CV: Negative for chest pain, angina, palpitations, peripheral edema.  Respiratory: Negative for dyspnea at rest, cough, sputum, wheezing.  GI: See history of present illness. Endo: Negative for unusual weight change.  Heme: Negative for bruising or bleeding.  Physical Exam: BP (!) 152/70   Pulse (!) 44   Temp (!) 96.9 F (36.1 C) (Oral)   Ht 5\' 2"  (1.575 m)   Wt 150 lb 9.6 oz (68.3 kg)   BMI 27.55 kg/m  General:   Alert and oriented. Pleasant and cooperative. Well-nourished and well-developed.  Eyes:  Without icterus, sclera clear and conjunctiva pink.  Ears:  Normal auditory acuity. Cardiovascular:  S1, S2 present without murmurs appreciated. Extremities without clubbing or edema. Respiratory:  Clear to auscultation bilaterally. No wheezes, rales, or rhonchi. No distress.  Gastrointestinal:  +BS, soft, non-tender and  non-distended. No HSM noted. No guarding or rebound. No masses appreciated.  Rectal:  Deferred  Musculoskalatal:  Symmetrical without gross deformities. Neurologic:  Alert and oriented x4;  grossly normal neurologically. Psych:  Alert and cooperative. Normal mood and affect. Heme/Lymph/Immune: No excessive bruising noted.    01/13/2018 8:48 AM   Disclaimer: This note was dictated with voice recognition software. Similar sounding words can inadvertently be transcribed and may not be corrected upon review.

## 2018-01-13 NOTE — Assessment & Plan Note (Signed)
Rectal bleeding likely due to known hemorrhoids.  Colonoscopy is up-to-date and is not due until 2020, if health permits.  Further treatment of hemorrhoids as per above.  Follow-up in 6 months.

## 2018-01-13 NOTE — Patient Instructions (Addendum)
1. Continue taking your current medications including your acid blocker and the rectal cream for hemorrhoids. 2. Limit toilet time to 5 minutes or less. 3. Increase the amount of water and fiber in your diet.  He should be eating 4-5 fruits and vegetables a day. 4. Return for follow-up in 6 months. 5. Call if you have any questions or concerns.  At [facility] we value your feedback. You may receive a survey about your visit today. Please share your experience as we strive to create trusting relationships with our patients to provide genuine, compassionate, quality care.  We appreciate your understanding and patience as we review any laboratory studies, imaging, and other diagnostic tests that are ordered as we care for you. Our office policy is 5 business days for review of these results, and any emergent or urgent results are addressed in a timely manner for your best interest. If you do not hear from our office in 1 week, please contact us.   We also encourage the use of MyChart, which contains your medical information for your review as well. If you are not enrolled in this feature, an access code is on this after visit summary for your convenience. Thank you for allowing [name] to be involved in your care.  It was great to see you today!  I hope you have a great Fall!!       Constipation, Adult Constipation is when a person has fewer bowel movements in a week than normal, has difficulty having a bowel movement, or has stools that are dry, hard, or larger than normal. Constipation may be caused by an underlying condition. It may become worse with age if a person takes certain medicines and does not take in enough fluids. Follow these instructions at home: Eating and drinking   Eat foods that have a lot of fiber, such as fresh fruits and vegetables, whole grains, and beans.  Limit foods that are high in fat, low in fiber, or overly processed, such as french fries,  hamburgers, cookies, candies, and soda.  Drink enough fluid to keep your urine clear or pale yellow. General instructions  Exercise regularly or as told by your health care provider.  Go to the restroom when you have the urge to go. Do not hold it in.  Take over-the-counter and prescription medicines only as told by your health care provider. These include any fiber supplements.  Practice pelvic floor retraining exercises, such as deep breathing while relaxing the lower abdomen and pelvic floor relaxation during bowel movements.  Watch your condition for any changes.  Keep all follow-up visits as told by your health care provider. This is important. Contact a health care provider if:  You have pain that gets worse.  You have a fever.  You do not have a bowel movement after 4 days.  You vomit.  You are not hungry.  You lose weight.  You are bleeding from the anus.  You have thin, pencil-like stools. Get help right away if:  You have a fever and your symptoms suddenly get worse.  You leak stool or have blood in your stool.  Your abdomen is bloated.  You have severe pain in your abdomen.  You feel dizzy or you faint. This information is not intended to replace advice given to you by your health care provider. Make sure you discuss any questions you have with your health care provider. Document Released: 01/06/2004 Document Revised: 10/28/2015 Document Reviewed: 09/28/2015 Elsevier Interactive Patient Education  2018 [place].    Hemorrhoids Hemorrhoids are swollen veins in and around the rectum or anus. There are two types of hemorrhoids:  Internal hemorrhoids. These occur in the veins that are just inside the rectum. They may poke through to the outside and become irritated and painful.  External hemorrhoids. These occur in the veins that are outside of the anus and can be felt as a painful swelling or hard lump near the anus.  Most hemorrhoids do not cause  serious problems, and they can be managed with home treatments such as diet and lifestyle changes. If home treatments do not help your symptoms, procedures can be done to shrink or remove the hemorrhoids. What are the causes? This condition is caused by increased pressure in the anal area. This pressure may result from various things, including:  Constipation.  Straining to have a bowel movement.  Diarrhea.  Pregnancy.  Obesity.  Sitting for long periods of time.  Heavy lifting or other activity that causes you to strain.  Anal sex.  What are the signs or symptoms? Symptoms of this condition include:  Pain.  Anal itching or irritation.  Rectal bleeding.  Leakage of stool (feces).  Anal swelling.  One or more lumps around the anus.  How is this diagnosed? This condition can often be diagnosed through a visual exam. Other exams or tests may also be done, such as:  Examination of the rectal area with a gloved hand (digital rectal exam).  Examination of the anal canal using a small tube (anoscope).  A blood test, if you have lost a significant amount of blood.  A test to look inside the colon (sigmoidoscopy or colonoscopy).  How is this treated? This condition can usually be treated at home. However, various procedures may be done if dietary changes, lifestyle changes, and other home treatments do not help your symptoms. These procedures can help make the hemorrhoids smaller or remove them completely. Some of these procedures involve surgery, and others do not. Common procedures include:  Rubber band ligation. Rubber bands are placed at the base of the hemorrhoids to cut off the blood supply to them.  Sclerotherapy. Medicine is injected into the hemorrhoids to shrink them.  Infrared coagulation. A type of light energy is used to get rid of the hemorrhoids.  Hemorrhoidectomy surgery. The hemorrhoids are surgically removed, and the veins that supply them are tied  off.  Stapled hemorrhoidopexy surgery. A circular stapling device is used to remove the hemorrhoids and use staples to cut off the blood supply to them.  Follow these instructions at home: Eating and drinking  Eat foods that have a lot of fiber in them, such as whole grains, beans, nuts, fruits, and vegetables. Ask your health care provider about taking products that have added fiber (fiber supplements).  Drink enough fluid to keep your urine clear or pale yellow. Managing pain and swelling  Take warm sitz baths for 20 minutes, 3-4 times a day to ease pain and discomfort.  If directed, apply ice to the affected area. Using ice packs between sitz baths may be helpful. ? Put ice in a plastic bag. ? Place a towel between your skin and the bag. ? Leave the ice on for 20 minutes, 2-3 times a day. General instructions  Take over-the-counter and prescription medicines only as told by your health care provider.  Use medicated creams or suppositories as told.  Exercise regularly.  Go to the bathroom when you have the urge to  have a bowel movement. Do not wait.  Avoid straining to have bowel movements.  Keep the anal area dry and clean. Use wet toilet paper or moist towelettes after a bowel movement.  Do not sit on the toilet for long periods of time. This increases blood pooling and pain. Contact a health care provider if:  You have increasing pain and swelling that are not controlled by treatment or medicine.  You have uncontrolled bleeding.  You have difficulty having a bowel movement, or you are unable to have a bowel movement.  You have pain or inflammation outside the area of the hemorrhoids. This information is not intended to replace advice given to you by your health care provider. Make sure you discuss any questions you have with your health care provider. Document Released: 04/06/2000 Document Revised: 09/07/2015 Document Reviewed: 12/22/2014 Elsevier Interactive Patient  Education  [employer].

## 2018-01-13 NOTE — Assessment & Plan Note (Signed)
Symptoms currently well managed on PPI.  Recommend she continue her medications and follow-up in 6 months.  Call if any problems or worsening symptoms before then.

## 2018-01-13 NOTE — Assessment & Plan Note (Addendum)
Persistent hemorrhoids with rectal bleeding but does pretty well on over-the-counter rectal cream.  She is on warfarin anticoagulation for A. fib.  I recommended that she limit toilet time to 5 minutes, try to prevent straining.  Continue MiraLAX and other medications to help prevent constipation.  Increase amount of water and fiber in her diet and follow-up in 6 months.

## 2018-01-13 NOTE — Progress Notes (Signed)
CC'D TO PCP °

## 2018-01-17 DIAGNOSIS — S52501D Unspecified fracture of the lower end of right radius, subsequent encounter for closed fracture with routine healing: Secondary | ICD-10-CM | POA: Diagnosis not present

## 2018-01-17 DIAGNOSIS — R978 Other abnormal tumor markers: Secondary | ICD-10-CM | POA: Diagnosis not present

## 2018-01-17 DIAGNOSIS — R591 Generalized enlarged lymph nodes: Secondary | ICD-10-CM | POA: Diagnosis not present

## 2018-01-17 DIAGNOSIS — Z6827 Body mass index (BMI) 27.0-27.9, adult: Secondary | ICD-10-CM | POA: Diagnosis not present

## 2018-01-17 DIAGNOSIS — S52601D Unspecified fracture of lower end of right ulna, subsequent encounter for closed fracture with routine healing: Secondary | ICD-10-CM | POA: Diagnosis not present

## 2018-01-17 DIAGNOSIS — K7460 Unspecified cirrhosis of liver: Secondary | ICD-10-CM | POA: Diagnosis not present

## 2018-01-28 ENCOUNTER — Ambulatory Visit (INDEPENDENT_AMBULATORY_CARE_PROVIDER_SITE_OTHER): Payer: Medicare Other | Admitting: *Deleted

## 2018-01-28 DIAGNOSIS — Z5181 Encounter for therapeutic drug level monitoring: Secondary | ICD-10-CM

## 2018-01-28 DIAGNOSIS — Z9889 Other specified postprocedural states: Secondary | ICD-10-CM

## 2018-01-28 DIAGNOSIS — I4819 Other persistent atrial fibrillation: Secondary | ICD-10-CM | POA: Diagnosis not present

## 2018-01-28 LAB — POCT INR: INR: 2.7 (ref 2.0–3.0)

## 2018-01-28 NOTE — Patient Instructions (Signed)
Continue coumadin 1 tablet daily except 1 1/2 tablets on Tuesdays Recheck in 4 weeks 

## 2018-01-29 DIAGNOSIS — S52601D Unspecified fracture of lower end of right ulna, subsequent encounter for closed fracture with routine healing: Secondary | ICD-10-CM | POA: Diagnosis not present

## 2018-01-29 DIAGNOSIS — S52501D Unspecified fracture of the lower end of right radius, subsequent encounter for closed fracture with routine healing: Secondary | ICD-10-CM | POA: Diagnosis not present

## 2018-02-07 ENCOUNTER — Other Ambulatory Visit: Payer: Self-pay | Admitting: Cardiology

## 2018-02-11 DIAGNOSIS — M810 Age-related osteoporosis without current pathological fracture: Secondary | ICD-10-CM | POA: Diagnosis not present

## 2018-02-13 DIAGNOSIS — E7800 Pure hypercholesterolemia, unspecified: Secondary | ICD-10-CM | POA: Diagnosis not present

## 2018-02-13 DIAGNOSIS — I4891 Unspecified atrial fibrillation: Secondary | ICD-10-CM | POA: Diagnosis not present

## 2018-02-13 DIAGNOSIS — M159 Polyosteoarthritis, unspecified: Secondary | ICD-10-CM | POA: Diagnosis not present

## 2018-02-13 DIAGNOSIS — J449 Chronic obstructive pulmonary disease, unspecified: Secondary | ICD-10-CM | POA: Diagnosis not present

## 2018-02-14 DIAGNOSIS — E039 Hypothyroidism, unspecified: Secondary | ICD-10-CM | POA: Diagnosis not present

## 2018-02-14 DIAGNOSIS — Z299 Encounter for prophylactic measures, unspecified: Secondary | ICD-10-CM | POA: Diagnosis not present

## 2018-02-14 DIAGNOSIS — F411 Generalized anxiety disorder: Secondary | ICD-10-CM | POA: Diagnosis not present

## 2018-02-14 DIAGNOSIS — Z789 Other specified health status: Secondary | ICD-10-CM | POA: Diagnosis not present

## 2018-02-14 DIAGNOSIS — J449 Chronic obstructive pulmonary disease, unspecified: Secondary | ICD-10-CM | POA: Diagnosis not present

## 2018-02-14 DIAGNOSIS — N183 Chronic kidney disease, stage 3 (moderate): Secondary | ICD-10-CM | POA: Diagnosis not present

## 2018-02-14 DIAGNOSIS — I4891 Unspecified atrial fibrillation: Secondary | ICD-10-CM | POA: Diagnosis not present

## 2018-02-21 DIAGNOSIS — Z6823 Body mass index (BMI) 23.0-23.9, adult: Secondary | ICD-10-CM | POA: Diagnosis not present

## 2018-02-21 DIAGNOSIS — I5032 Chronic diastolic (congestive) heart failure: Secondary | ICD-10-CM | POA: Diagnosis not present

## 2018-02-21 DIAGNOSIS — E785 Hyperlipidemia, unspecified: Secondary | ICD-10-CM | POA: Diagnosis not present

## 2018-02-21 DIAGNOSIS — Z299 Encounter for prophylactic measures, unspecified: Secondary | ICD-10-CM | POA: Diagnosis not present

## 2018-02-21 DIAGNOSIS — J449 Chronic obstructive pulmonary disease, unspecified: Secondary | ICD-10-CM | POA: Diagnosis not present

## 2018-02-21 DIAGNOSIS — F411 Generalized anxiety disorder: Secondary | ICD-10-CM | POA: Diagnosis not present

## 2018-02-24 DIAGNOSIS — Z6823 Body mass index (BMI) 23.0-23.9, adult: Secondary | ICD-10-CM | POA: Diagnosis not present

## 2018-02-24 DIAGNOSIS — E785 Hyperlipidemia, unspecified: Secondary | ICD-10-CM | POA: Diagnosis not present

## 2018-02-24 DIAGNOSIS — I5032 Chronic diastolic (congestive) heart failure: Secondary | ICD-10-CM | POA: Diagnosis not present

## 2018-02-24 DIAGNOSIS — Z299 Encounter for prophylactic measures, unspecified: Secondary | ICD-10-CM | POA: Diagnosis not present

## 2018-02-25 ENCOUNTER — Ambulatory Visit (INDEPENDENT_AMBULATORY_CARE_PROVIDER_SITE_OTHER): Payer: Medicare Other | Admitting: *Deleted

## 2018-02-25 DIAGNOSIS — Z9889 Other specified postprocedural states: Secondary | ICD-10-CM | POA: Diagnosis not present

## 2018-02-25 DIAGNOSIS — I4819 Other persistent atrial fibrillation: Secondary | ICD-10-CM | POA: Diagnosis not present

## 2018-02-25 DIAGNOSIS — Z5181 Encounter for therapeutic drug level monitoring: Secondary | ICD-10-CM

## 2018-02-25 LAB — POCT INR: INR: 2.1 (ref 2.0–3.0)

## 2018-02-25 NOTE — Patient Instructions (Signed)
Continue coumadin 1 tablet daily except 1 1/2 tablets on Tuesdays Recheck in 4 weeks 

## 2018-02-27 DIAGNOSIS — R0602 Shortness of breath: Secondary | ICD-10-CM | POA: Diagnosis not present

## 2018-03-02 ENCOUNTER — Other Ambulatory Visit: Payer: Self-pay | Admitting: Student

## 2018-03-07 DIAGNOSIS — J449 Chronic obstructive pulmonary disease, unspecified: Secondary | ICD-10-CM | POA: Diagnosis not present

## 2018-03-07 DIAGNOSIS — Z6822 Body mass index (BMI) 22.0-22.9, adult: Secondary | ICD-10-CM | POA: Diagnosis not present

## 2018-03-07 DIAGNOSIS — Z299 Encounter for prophylactic measures, unspecified: Secondary | ICD-10-CM | POA: Diagnosis not present

## 2018-03-07 DIAGNOSIS — Z789 Other specified health status: Secondary | ICD-10-CM | POA: Diagnosis not present

## 2018-03-07 DIAGNOSIS — R413 Other amnesia: Secondary | ICD-10-CM | POA: Diagnosis not present

## 2018-03-07 DIAGNOSIS — I4891 Unspecified atrial fibrillation: Secondary | ICD-10-CM | POA: Diagnosis not present

## 2018-03-07 DIAGNOSIS — N183 Chronic kidney disease, stage 3 (moderate): Secondary | ICD-10-CM | POA: Diagnosis not present

## 2018-03-12 ENCOUNTER — Ambulatory Visit (INDEPENDENT_AMBULATORY_CARE_PROVIDER_SITE_OTHER): Payer: Medicare Other | Admitting: Internal Medicine

## 2018-03-12 ENCOUNTER — Encounter: Payer: Self-pay | Admitting: Internal Medicine

## 2018-03-12 VITALS — BP 124/74 | HR 67 | Ht 62.0 in | Wt 145.0 lb

## 2018-03-12 DIAGNOSIS — I495 Sick sinus syndrome: Secondary | ICD-10-CM | POA: Diagnosis not present

## 2018-03-12 DIAGNOSIS — I4819 Other persistent atrial fibrillation: Secondary | ICD-10-CM | POA: Diagnosis not present

## 2018-03-12 MED ORDER — AMIODARONE HCL 200 MG PO TABS: ORAL_TABLET | ORAL | 3 refills | Status: DC

## 2018-03-12 NOTE — Progress Notes (Signed)
HPI Ms. Alatorre returns today for followup of her atrial fib, sinus node dysfunction and diastolic heart failure. Her symptoms are class 2. She has not felt her heart race. She has not had syncope. Her leg swelling is no better. She denies sodium indiscretion.  Allergies  Allergen Reactions  . Iohexol Hives and Shortness Of Breath     Pt. had a severe allergic reaction to IV contrast the last time she was injected and had to be seen in the ER.   Marland Kitchen Hydrocodone-Acetaminophen Hives  . Nabumetone Rash  . Prednisone Rash     Current Outpatient Medications  Medication Sig Dispense Refill  . acetaminophen (TYLENOL) 500 MG tablet Take 1,000 mg by mouth 2 (two) times daily as needed for headache.    . albuterol (PROVENTIL HFA;VENTOLIN HFA) 108 (90 BASE) MCG/ACT inhaler Inhale 2 puffs into the lungs every 6 (six) hours as needed for wheezing.    Marland Kitchen albuterol (PROVENTIL) (2.5 MG/3ML) 0.083% nebulizer solution Inhale 3 mLs into the lungs at bedtime. May inhale a second dose during the day as needed for shortness of breath  12  . ALPRAZolam (XANAX) 0.5 MG tablet Take 0.5 mg by mouth 2 (two) times daily as needed (for anxiety/sleep (scheduled at bedtime)).     Marland Kitchen amiodarone (PACERONE) 200 MG tablet Take 1 tablet (200 mg total) by mouth daily. NONE on Sunday 90 tablet 3  . aspirin EC 81 MG EC tablet Take 1 tablet (81 mg total) by mouth daily.    . calcium carbonate (OS-CAL) 600 MG TABS tablet Take 600 mg by mouth 2 (two) times daily with a meal.    . Calcium Carbonate-Vitamin D 600-400 MG-UNIT tablet Take 1 tablet by mouth 2 (two) times daily.    . Coenzyme Q10 100 MG TABS Take 100 mg by mouth every evening.     . docusate sodium (COLACE) 100 MG capsule Take 200 mg by mouth daily as needed for mild constipation.     . furosemide (LASIX) 40 MG tablet Take 40 mg by mouth 2 (two) times daily as needed.    . hydrocortisone (ANUSOL-HC) 2.5 % rectal cream Place 1 application rectally 2 (two) times  daily. FOR UP TO 10 DAYS AT A TIME (Patient taking differently: Place 1 application rectally 2 (two) times daily as needed for hemorrhoids or itching. FOR UP TO 10 DAYS AT A TIME) 30 g 1  . KLOR-CON M20 20 MEQ tablet TAKE 1 TABLET BY MOUTH TWICE A DAY 180 tablet 3  . levothyroxine (SYNTHROID, LEVOTHROID) 75 MCG tablet Take 1 tablet by mouth daily.  4  . Multiple Vitamin (MULTIVITAMIN) tablet Take 1 tablet by mouth daily.      Marland Kitchen omeprazole (PRILOSEC) 20 MG capsule Take 1 capsule (20 mg total) by mouth daily. 30 capsule 3  . PARoxetine (PAXIL) 20 MG tablet Take 20 mg by mouth daily.     Vladimir Faster Glycol-Propyl Glycol (SYSTANE ULTRA) 0.4-0.3 % SOLN Place 1 drop into both eyes daily as needed (dry eyes).     . simvastatin (ZOCOR) 40 MG tablet Take 40 mg daily by mouth.    . sodium chloride (OCEAN) 0.65 % SOLN nasal spray Place 1 spray into both nostrils as needed for congestion.    . traMADol (ULTRAM) 50 MG tablet Take 1 tablet (50 mg total) by mouth every 6 (six) hours as needed. (Patient taking differently: Take 50 mg by mouth every 6 (six) hours as needed for moderate  pain. ) 15 tablet 0  . warfarin (COUMADIN) 2.5 MG tablet Take 1 tablet daily except 1 1/2 tablets on Sundays and Thursdays or as directed (Patient taking differently: Take 2.5 mg daily, except Tues 3.75mg ) 100 tablet 3   No current facility-administered medications for this visit.      Past Medical History:  Diagnosis Date  . Anxiety   . Arthritis   . Asthma   . Atrial fibrillation, persistent   . Chronic diastolic congestive heart failure ([place])   . Colon adenoma   . Colon cancer ([place])    status post low anterior resection, limited stage disease requiring no adjuvant therapy  . COPD (chronic obstructive pulmonary disease) ([place])   . Depression   . Diverticulosis   . DM (dermatomyositis)   . DVT (deep venous thrombosis) ([name])    in leg- long time ago  . Dyspnea    with activity  . Dysrhythmia   . Esophageal dysphagia     . GERD (gastroesophageal reflux disease)   . Headache   . Heart murmur   . Hematuria   . Hemorrhoids   . Hiatal hernia   . History of kidney stones    x 2  . Hypercholesterolemia   . Incidental pulmonary nodule 05/08/2016   8 mm vague opacity RML noted on CT scan  . Mitral regurgitation   . PONV (postoperative nausea and vomiting) 2003 ish    with breast biopsy  . S/P minimally invasive maze operation for atrial fibrillation 07/05/2016   Complete bilateral atrial lesion set using cryothermy and bipolar radiofrequency ablation with clipping of LA appendage via right mini thoracotomy approach  . 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 Schatzki's ring   . Tricuspid regurgitation     ROS:   All systems reviewed and negative except as noted in the HPI.   Past Surgical History:  Procedure Laterality Date  . APPENDECTOMY    . BREAST SURGERY Right 2003ish   biopsy  . CARDIAC CATHETERIZATION N/A 04/12/2016   Procedure: Right/Left Heart Cath and Coronary Angiography;  Surgeon: Leonie Man, MD;  Location: [facility] [facility];  Service: Cardiovascular;  Laterality: N/A;  . CARDIOVERSION N/A 08/16/2017   Procedure: CARDIOVERSION;  Surgeon: Arnoldo Lenis, MD;  Location: [name];  Service: Endoscopy;  Laterality: N/A;  . CATARACT EXTRACTION Bilateral 2017  . CLIPPING OF ATRIAL APPENDAGE  07/05/2016   Procedure: CLIPPING OF ATRIAL APPENDAGE;  Surgeon: Rexene Alberts, MD;  Location: [facility];  Service: Open Heart Surgery;;  . COLONOSCOPY  11/09   Dr. Gala Romney- external hemorrhoidal tags o/w normal rectal mucosa, s/p surgical resection with a normal appearing anastomosis 12cm, pan colonic diverticulum  . COLONOSCOPY N/A 06/02/2012   UEA:[id number] post low anterior resection. Pancolonic diverticulosis. Colonic polyp-tubular  adenoma. Surveillance due 2019.   Marland Kitchen COLONOSCOPY N/A 10/13/2015   Procedure: COLONOSCOPY;  Surgeon: Daneil Dolin, MD;  [name];  Service: Endoscopy;  Laterality: N/A;  0930  . ESOPHAGOGASTRODUODENOSCOPY  07/2007   Dr. Daiva Nakayama cervical esophageal web, schatzki ring, large hiatal hernia  . INGUNAL HERNIA REPAIR Left   . LOW ANTERIOR BOWEL RESECTION     NO ADJ CHEMO  . MINIMALLY INVASIVE MAZE PROCEDURE N/A 07/05/2016   Procedure: MINIMALLY INVASIVE MAZE PROCEDURE;  Surgeon: Rexene Alberts, MD;  Location: [place];  Service: Open Heart Surgery;  Laterality: N/A;  . MITRAL VALVE REPAIR Right 07/05/2016   Procedure: MINIMALLY INVASIVE MITRAL VALVE REPAIR (MVR);  Surgeon: Rexene Alberts, MD;  Location: [facility];  Service: Open Heart Surgery;  Laterality: Right;  . MULTIPLE EXTRACTIONS WITH ALVEOLOPLASTY N/A 05/21/2016   Procedure: MULTIPLE EXTRACTION OF TOOTH #'S 5, 21 WITH ALVEOLOPLASTY AND GROSS DEBRIDEMENT OF TEETH;  Surgeon: Lenn Cal, DDS;  Location: [facility];  Service: Oral Surgery;  Laterality: N/A;  . TEE WITHOUT CARDIOVERSION N/A 02/20/2016   Procedure: TRANSESOPHAGEAL ECHOCARDIOGRAM (TEE);  Surgeon: Arnoldo Lenis, MD;  [name];  Service: Endoscopy;  Laterality: N/A;  . TEE WITHOUT CARDIOVERSION N/A 07/05/2016   Procedure: TRANSESOPHAGEAL ECHOCARDIOGRAM (TEE);  Surgeon: Rexene Alberts, MD;  Location: [place];  Service: Open Heart Surgery;  Laterality: N/A;  . TOOTH EXTRACTION    . TUBAL LIGATION    . VEIN LIGATION AND STRIPPING       Family History  Problem Relation Age of Onset  . Breast cancer Mother   . Rheum arthritis Father   . Cancer - Lung Sister   . Brain cancer Sister   . Prostate cancer Brother   . Aneurysm Brother   . Colon cancer Neg Hx      Social History   Socioeconomic History  . Marital status: Married    Spouse name: Not on file  . Number of children: 2  . Years of education: Not on file  . Highest education level:  Not on file  Occupational History  . Not on file  Social Needs  . Financial resource strain: Not on file  . Food insecurity:    Worry: Not on file    Inability: Not on file  . Transportation needs:    Medical: Not on file    Non-medical: Not on file  Tobacco Use  . Smoking status: Never Smoker  . Smokeless tobacco: Never Used  Substance and Sexual Activity  . Alcohol use: No    Alcohol/week: 0.0 standard drinks  . Drug use: No  . Sexual activity: Not on file  Lifestyle  . Physical activity:    Days per week: Not on file    Minutes per session: Not on file  . Stress: Not on file  Relationships  . Social connections:    Talks on phone: Not on file    Gets together: Not on file    Attends religious service: Not on file    Active member of club or organization: Not on file    Attends meetings of clubs or organizations: Not on file    Relationship status: Not on file  . Intimate partner violence:    Fear of current or ex partner: Not on file    Emotionally abused: Not on file    Physically abused: Not on file    Forced sexual activity: Not on file  Other Topics Concern  . Not on file  Social History Narrative  . Not on file     BP 124/74   Pulse 67   Ht 5\' 2"  (1.575 m)   Wt 145 lb (65.8 kg)   SpO2 90%   BMI 26.52 kg/m   Physical Exam:  Well appearing NAD HEENT: Unremarkable Neck:  No JVD, no thyromegally Lymphatics:  No adenopathy Back:  No CVA tenderness Lungs:  Clear with no wheezes HEART:  Regular brady rhythm, no murmurs, no rubs, no clicks Abd:  soft, positive bowel sounds, no  organomegally, no rebound, no guarding Ext:  2 plus pulses, no edema, no cyanosis, no clubbing Skin:  No rashes no nodules Neuro:  CN II through XII intact, motor grossly intact   Assess/Plan: 1. Sinus node dysfunction - her rates are a bit better today although she was slow on my exam. She will undergo watchful waiting. Down the road she will likely require PPM. 2. Chronic  diastolic heart failure - I discussed the importance of a low sodium diet. We discussed wearing support stockings but she declines. She will continue her lasix. 3. PAF - she is maintaining NSR. I have asked that she take her amiodarone 5 days a week, one gram a week total. 4. HTN - her blood pressure is well controlled.   Mikle Bosworth.D.

## 2018-03-12 NOTE — Patient Instructions (Addendum)
Medication Instructions:  Your physician has recommended you make the following change in your medication:  Take Amiodarone Daily Monday -Friday do Not take any on Sat or Sun.   If you need a refill on your cardiac medications before your next appointment, please call your pharmacy.   Lab work: NONE   If you have labs (blood work) drawn today and your tests are completely normal, you will receive your results only by: Marland Kitchen MyChart Message (if you have MyChart) OR . A paper copy in the mail If you have any lab test that is abnormal or we need to change your treatment, we will call you to review the results.  Testing/Procedures: NONE   Follow-Up: At [facility], you and your health needs are our priority.  As part of our continuing mission to provide you with exceptional heart care, we have created designated Provider Care Teams.  These Care Teams include your primary Cardiologist (physician) and Advanced Practice Providers (APPs -  Physician Assistants and Nurse Practitioners) who all work together to provide you with the care you need, when you need it. You will need a follow up appointment in 6 months.  Please call our office 2 months in advance to schedule this appointment.  You may see Cristopher Peru, MD or one of the following Advanced Practice Providers on your designated Care Team:   Chanetta Marshall, NP . Tommye Standard, PA-C  Any Other Special Instructions Will Be Listed Below (If Applicable). Thank you for choosing [facility]!

## 2018-03-13 DIAGNOSIS — I4891 Unspecified atrial fibrillation: Secondary | ICD-10-CM | POA: Diagnosis not present

## 2018-03-13 DIAGNOSIS — J449 Chronic obstructive pulmonary disease, unspecified: Secondary | ICD-10-CM | POA: Diagnosis not present

## 2018-03-13 DIAGNOSIS — M159 Polyosteoarthritis, unspecified: Secondary | ICD-10-CM | POA: Diagnosis not present

## 2018-03-13 DIAGNOSIS — E7800 Pure hypercholesterolemia, unspecified: Secondary | ICD-10-CM | POA: Diagnosis not present

## 2018-03-14 DIAGNOSIS — R0602 Shortness of breath: Secondary | ICD-10-CM | POA: Diagnosis not present

## 2018-03-22 ENCOUNTER — Other Ambulatory Visit: Payer: Self-pay | Admitting: Cardiology

## 2018-03-25 ENCOUNTER — Ambulatory Visit (INDEPENDENT_AMBULATORY_CARE_PROVIDER_SITE_OTHER): Payer: Medicare Other | Admitting: *Deleted

## 2018-03-25 DIAGNOSIS — Z9889 Other specified postprocedural states: Secondary | ICD-10-CM

## 2018-03-25 DIAGNOSIS — Z5181 Encounter for therapeutic drug level monitoring: Secondary | ICD-10-CM | POA: Diagnosis not present

## 2018-03-25 DIAGNOSIS — I4819 Other persistent atrial fibrillation: Secondary | ICD-10-CM | POA: Diagnosis not present

## 2018-03-25 LAB — POCT INR: INR: 1.7 — AB (ref 2.0–3.0)

## 2018-03-25 NOTE — Patient Instructions (Signed)
Take coumadin 2 tablets tonight then increase dose to 1 tablet daily except 1 1/2 tablets on Tuesdays and Fridays Recheck in 3 weeks

## 2018-04-02 DIAGNOSIS — I4891 Unspecified atrial fibrillation: Secondary | ICD-10-CM | POA: Diagnosis not present

## 2018-04-02 DIAGNOSIS — N183 Chronic kidney disease, stage 3 (moderate): Secondary | ICD-10-CM | POA: Diagnosis not present

## 2018-04-02 DIAGNOSIS — J449 Chronic obstructive pulmonary disease, unspecified: Secondary | ICD-10-CM | POA: Diagnosis not present

## 2018-04-02 DIAGNOSIS — Z6823 Body mass index (BMI) 23.0-23.9, adult: Secondary | ICD-10-CM | POA: Diagnosis not present

## 2018-04-02 DIAGNOSIS — K649 Unspecified hemorrhoids: Secondary | ICD-10-CM | POA: Diagnosis not present

## 2018-04-02 DIAGNOSIS — Z299 Encounter for prophylactic measures, unspecified: Secondary | ICD-10-CM | POA: Diagnosis not present

## 2018-04-11 DIAGNOSIS — J449 Chronic obstructive pulmonary disease, unspecified: Secondary | ICD-10-CM | POA: Diagnosis not present

## 2018-04-11 DIAGNOSIS — E7800 Pure hypercholesterolemia, unspecified: Secondary | ICD-10-CM | POA: Diagnosis not present

## 2018-04-11 DIAGNOSIS — I4891 Unspecified atrial fibrillation: Secondary | ICD-10-CM | POA: Diagnosis not present

## 2018-04-11 DIAGNOSIS — M159 Polyosteoarthritis, unspecified: Secondary | ICD-10-CM | POA: Diagnosis not present

## 2018-04-15 ENCOUNTER — Ambulatory Visit (INDEPENDENT_AMBULATORY_CARE_PROVIDER_SITE_OTHER): Payer: Medicare Other | Admitting: *Deleted

## 2018-04-15 DIAGNOSIS — I4819 Other persistent atrial fibrillation: Secondary | ICD-10-CM | POA: Diagnosis not present

## 2018-04-15 DIAGNOSIS — Z9889 Other specified postprocedural states: Secondary | ICD-10-CM | POA: Diagnosis not present

## 2018-04-15 DIAGNOSIS — Z5181 Encounter for therapeutic drug level monitoring: Secondary | ICD-10-CM | POA: Diagnosis not present

## 2018-04-15 LAB — POCT INR: INR: 1.6 — AB (ref 2.0–3.0)

## 2018-04-15 NOTE — Patient Instructions (Signed)
Take coumadin 2 tablets tonight then increase dose to 1 1/2 tablets daily except 1 tablet on Sundays, Tuesdays and Thursdays Recheck in 2 weeks

## 2018-04-24 ENCOUNTER — Other Ambulatory Visit: Payer: Self-pay | Admitting: Internal Medicine

## 2018-05-01 ENCOUNTER — Ambulatory Visit (INDEPENDENT_AMBULATORY_CARE_PROVIDER_SITE_OTHER): Payer: Medicare Other | Admitting: Pharmacist

## 2018-05-01 DIAGNOSIS — I4819 Other persistent atrial fibrillation: Secondary | ICD-10-CM

## 2018-05-01 DIAGNOSIS — Z5181 Encounter for therapeutic drug level monitoring: Secondary | ICD-10-CM

## 2018-05-01 DIAGNOSIS — Z9889 Other specified postprocedural states: Secondary | ICD-10-CM

## 2018-05-01 LAB — POCT INR: INR: 1.8 — AB (ref 2.0–3.0)

## 2018-05-01 NOTE — Patient Instructions (Signed)
Description   Take coumadin 1 1/2 tablets tonight then continue dose to 1 1/2 tablets daily except 1 tablet on Sundays, Tuesdays and Thursdays Recheck in 2 weeks

## 2018-05-07 DIAGNOSIS — I4891 Unspecified atrial fibrillation: Secondary | ICD-10-CM | POA: Diagnosis not present

## 2018-05-07 DIAGNOSIS — R609 Edema, unspecified: Secondary | ICD-10-CM | POA: Diagnosis not present

## 2018-05-07 DIAGNOSIS — J449 Chronic obstructive pulmonary disease, unspecified: Secondary | ICD-10-CM | POA: Diagnosis not present

## 2018-05-07 DIAGNOSIS — Z6821 Body mass index (BMI) 21.0-21.9, adult: Secondary | ICD-10-CM | POA: Diagnosis not present

## 2018-05-07 DIAGNOSIS — I5032 Chronic diastolic (congestive) heart failure: Secondary | ICD-10-CM | POA: Diagnosis not present

## 2018-05-07 DIAGNOSIS — Z299 Encounter for prophylactic measures, unspecified: Secondary | ICD-10-CM | POA: Diagnosis not present

## 2018-05-09 DIAGNOSIS — E7800 Pure hypercholesterolemia, unspecified: Secondary | ICD-10-CM | POA: Diagnosis not present

## 2018-05-09 DIAGNOSIS — M159 Polyosteoarthritis, unspecified: Secondary | ICD-10-CM | POA: Diagnosis not present

## 2018-05-09 DIAGNOSIS — I4891 Unspecified atrial fibrillation: Secondary | ICD-10-CM | POA: Diagnosis not present

## 2018-05-09 DIAGNOSIS — J449 Chronic obstructive pulmonary disease, unspecified: Secondary | ICD-10-CM | POA: Diagnosis not present

## 2018-05-12 DIAGNOSIS — R590 Localized enlarged lymph nodes: Secondary | ICD-10-CM | POA: Diagnosis not present

## 2018-05-12 DIAGNOSIS — R945 Abnormal results of liver function studies: Secondary | ICD-10-CM | POA: Diagnosis not present

## 2018-05-12 DIAGNOSIS — I700 Atherosclerosis of aorta: Secondary | ICD-10-CM | POA: Diagnosis not present

## 2018-05-12 DIAGNOSIS — K449 Diaphragmatic hernia without obstruction or gangrene: Secondary | ICD-10-CM | POA: Diagnosis not present

## 2018-05-12 DIAGNOSIS — R188 Other ascites: Secondary | ICD-10-CM | POA: Diagnosis not present

## 2018-05-12 DIAGNOSIS — A1839 Retroperitoneal tuberculosis: Secondary | ICD-10-CM | POA: Diagnosis not present

## 2018-05-12 DIAGNOSIS — I517 Cardiomegaly: Secondary | ICD-10-CM | POA: Diagnosis not present

## 2018-05-15 ENCOUNTER — Ambulatory Visit (INDEPENDENT_AMBULATORY_CARE_PROVIDER_SITE_OTHER): Payer: Medicare Other | Admitting: Pharmacist

## 2018-05-15 DIAGNOSIS — Z5181 Encounter for therapeutic drug level monitoring: Secondary | ICD-10-CM | POA: Diagnosis not present

## 2018-05-15 DIAGNOSIS — Z9889 Other specified postprocedural states: Secondary | ICD-10-CM

## 2018-05-15 DIAGNOSIS — I4819 Other persistent atrial fibrillation: Secondary | ICD-10-CM | POA: Diagnosis not present

## 2018-05-15 LAB — POCT INR: INR: 2.1 (ref 2.0–3.0)

## 2018-05-15 MED ORDER — WARFARIN SODIUM 2.5 MG PO TABS: ORAL_TABLET | ORAL | 0 refills | Status: DC

## 2018-05-15 NOTE — Patient Instructions (Signed)
Description   Continue dose to 1 1/2 tablets daily except 1 tablet on Sundays, Tuesdays and Thursdays Recheck in 3 weeks

## 2018-05-19 DIAGNOSIS — R7989 Other specified abnormal findings of blood chemistry: Secondary | ICD-10-CM | POA: Diagnosis not present

## 2018-05-19 DIAGNOSIS — D124 Benign neoplasm of descending colon: Secondary | ICD-10-CM | POA: Diagnosis not present

## 2018-05-19 DIAGNOSIS — Z6827 Body mass index (BMI) 27.0-27.9, adult: Secondary | ICD-10-CM | POA: Diagnosis not present

## 2018-05-19 DIAGNOSIS — R601 Generalized edema: Secondary | ICD-10-CM | POA: Diagnosis not present

## 2018-05-19 DIAGNOSIS — R9389 Abnormal findings on diagnostic imaging of other specified body structures: Secondary | ICD-10-CM | POA: Diagnosis not present

## 2018-05-19 DIAGNOSIS — J449 Chronic obstructive pulmonary disease, unspecified: Secondary | ICD-10-CM | POA: Diagnosis not present

## 2018-05-19 DIAGNOSIS — R591 Generalized enlarged lymph nodes: Secondary | ICD-10-CM | POA: Diagnosis not present

## 2018-05-19 DIAGNOSIS — K649 Unspecified hemorrhoids: Secondary | ICD-10-CM | POA: Diagnosis not present

## 2018-05-19 DIAGNOSIS — K7460 Unspecified cirrhosis of liver: Secondary | ICD-10-CM | POA: Diagnosis not present

## 2018-05-19 DIAGNOSIS — R809 Proteinuria, unspecified: Secondary | ICD-10-CM | POA: Diagnosis not present

## 2018-05-19 DIAGNOSIS — R978 Other abnormal tumor markers: Secondary | ICD-10-CM | POA: Diagnosis not present

## 2018-05-19 DIAGNOSIS — R935 Abnormal findings on diagnostic imaging of other abdominal regions, including retroperitoneum: Secondary | ICD-10-CM | POA: Diagnosis not present

## 2018-05-27 DIAGNOSIS — Z6822 Body mass index (BMI) 22.0-22.9, adult: Secondary | ICD-10-CM | POA: Diagnosis not present

## 2018-05-27 DIAGNOSIS — R0602 Shortness of breath: Secondary | ICD-10-CM | POA: Diagnosis not present

## 2018-05-27 DIAGNOSIS — Z79899 Other long term (current) drug therapy: Secondary | ICD-10-CM | POA: Diagnosis not present

## 2018-05-27 DIAGNOSIS — I5032 Chronic diastolic (congestive) heart failure: Secondary | ICD-10-CM | POA: Diagnosis not present

## 2018-05-27 DIAGNOSIS — Z789 Other specified health status: Secondary | ICD-10-CM | POA: Diagnosis not present

## 2018-05-27 DIAGNOSIS — J449 Chronic obstructive pulmonary disease, unspecified: Secondary | ICD-10-CM | POA: Diagnosis not present

## 2018-05-27 DIAGNOSIS — N183 Chronic kidney disease, stage 3 (moderate): Secondary | ICD-10-CM | POA: Diagnosis not present

## 2018-05-27 DIAGNOSIS — Z299 Encounter for prophylactic measures, unspecified: Secondary | ICD-10-CM | POA: Diagnosis not present

## 2018-05-27 DIAGNOSIS — I4891 Unspecified atrial fibrillation: Secondary | ICD-10-CM | POA: Diagnosis not present

## 2018-06-05 ENCOUNTER — Ambulatory Visit (INDEPENDENT_AMBULATORY_CARE_PROVIDER_SITE_OTHER): Payer: Medicare Other | Admitting: Pharmacist

## 2018-06-05 DIAGNOSIS — Z9889 Other specified postprocedural states: Secondary | ICD-10-CM

## 2018-06-05 DIAGNOSIS — I4819 Other persistent atrial fibrillation: Secondary | ICD-10-CM

## 2018-06-05 DIAGNOSIS — Z5181 Encounter for therapeutic drug level monitoring: Secondary | ICD-10-CM

## 2018-06-05 LAB — POCT INR: INR: 1.2 — AB (ref 2.0–3.0)

## 2018-06-05 NOTE — Patient Instructions (Signed)
Description   Take 1 1/2 tablets tonight and 2 tablets tomorrow then continue dose to 1 1/2 tablets daily except 1 tablet on Sundays, Tuesdays and Thursdays Recheck in 2 weeks

## 2018-06-12 DIAGNOSIS — E7800 Pure hypercholesterolemia, unspecified: Secondary | ICD-10-CM | POA: Diagnosis not present

## 2018-06-12 DIAGNOSIS — J449 Chronic obstructive pulmonary disease, unspecified: Secondary | ICD-10-CM | POA: Diagnosis not present

## 2018-06-12 DIAGNOSIS — M159 Polyosteoarthritis, unspecified: Secondary | ICD-10-CM | POA: Diagnosis not present

## 2018-06-12 DIAGNOSIS — I4891 Unspecified atrial fibrillation: Secondary | ICD-10-CM | POA: Diagnosis not present

## 2018-06-16 DIAGNOSIS — I4891 Unspecified atrial fibrillation: Secondary | ICD-10-CM | POA: Diagnosis not present

## 2018-06-16 DIAGNOSIS — I5032 Chronic diastolic (congestive) heart failure: Secondary | ICD-10-CM | POA: Diagnosis not present

## 2018-06-16 DIAGNOSIS — R590 Localized enlarged lymph nodes: Secondary | ICD-10-CM | POA: Diagnosis not present

## 2018-06-16 DIAGNOSIS — Z681 Body mass index (BMI) 19 or less, adult: Secondary | ICD-10-CM | POA: Diagnosis not present

## 2018-06-16 DIAGNOSIS — Z299 Encounter for prophylactic measures, unspecified: Secondary | ICD-10-CM | POA: Diagnosis not present

## 2018-06-16 DIAGNOSIS — J449 Chronic obstructive pulmonary disease, unspecified: Secondary | ICD-10-CM | POA: Diagnosis not present

## 2018-06-19 ENCOUNTER — Ambulatory Visit (INDEPENDENT_AMBULATORY_CARE_PROVIDER_SITE_OTHER): Payer: Medicare Other | Admitting: *Deleted

## 2018-06-19 DIAGNOSIS — Z9889 Other specified postprocedural states: Secondary | ICD-10-CM | POA: Diagnosis not present

## 2018-06-19 DIAGNOSIS — I4819 Other persistent atrial fibrillation: Secondary | ICD-10-CM

## 2018-06-19 DIAGNOSIS — Z5181 Encounter for therapeutic drug level monitoring: Secondary | ICD-10-CM | POA: Diagnosis not present

## 2018-06-19 LAB — POCT INR: INR: 1.4 — AB (ref 2.0–3.0)

## 2018-06-19 NOTE — Patient Instructions (Signed)
Take 1 1/2 tablets daily and recheck in 1 week

## 2018-06-20 DIAGNOSIS — D649 Anemia, unspecified: Secondary | ICD-10-CM | POA: Diagnosis not present

## 2018-06-20 DIAGNOSIS — Z79899 Other long term (current) drug therapy: Secondary | ICD-10-CM | POA: Diagnosis not present

## 2018-06-23 DIAGNOSIS — H43813 Vitreous degeneration, bilateral: Secondary | ICD-10-CM | POA: Diagnosis not present

## 2018-06-26 ENCOUNTER — Ambulatory Visit (INDEPENDENT_AMBULATORY_CARE_PROVIDER_SITE_OTHER): Payer: Medicare Other | Admitting: *Deleted

## 2018-06-26 DIAGNOSIS — Z5181 Encounter for therapeutic drug level monitoring: Secondary | ICD-10-CM | POA: Diagnosis not present

## 2018-06-26 DIAGNOSIS — Z9889 Other specified postprocedural states: Secondary | ICD-10-CM | POA: Diagnosis not present

## 2018-06-26 DIAGNOSIS — I4819 Other persistent atrial fibrillation: Secondary | ICD-10-CM | POA: Diagnosis not present

## 2018-06-26 LAB — POCT INR: INR: 1.6 — AB (ref 2.0–3.0)

## 2018-06-26 NOTE — Patient Instructions (Signed)
Take coumadin 2 tablets tonight and tomorrow night then resume 1 1/2 tablets daily and recheck in 1 week

## 2018-06-27 DIAGNOSIS — Z6822 Body mass index (BMI) 22.0-22.9, adult: Secondary | ICD-10-CM | POA: Diagnosis not present

## 2018-06-27 DIAGNOSIS — R978 Other abnormal tumor markers: Secondary | ICD-10-CM | POA: Diagnosis not present

## 2018-06-27 DIAGNOSIS — R591 Generalized enlarged lymph nodes: Secondary | ICD-10-CM | POA: Diagnosis not present

## 2018-06-27 DIAGNOSIS — I5032 Chronic diastolic (congestive) heart failure: Secondary | ICD-10-CM | POA: Diagnosis not present

## 2018-06-27 DIAGNOSIS — R634 Abnormal weight loss: Secondary | ICD-10-CM | POA: Diagnosis not present

## 2018-06-30 ENCOUNTER — Other Ambulatory Visit (HOSPITAL_COMMUNITY)
Admission: RE | Admit: 2018-06-30 | Discharge: 2018-06-30 | Disposition: A | Discharge status: Home / Self Care | Payer: Medicare Other | Source: Ambulatory Visit | Attending: "Endocrinology | Admitting: "Endocrinology

## 2018-06-30 DIAGNOSIS — E032 Hypothyroidism due to medicaments and other exogenous substances: Secondary | ICD-10-CM | POA: Insufficient documentation

## 2018-06-30 LAB — T4, FREE: Free T4: 1.23 ng/dL (ref 0.82–1.77)

## 2018-06-30 LAB — TSH: TSH: 0.311 u[IU]/mL — ABNORMAL LOW (ref 0.350–4.500)

## 2018-07-03 ENCOUNTER — Ambulatory Visit (INDEPENDENT_AMBULATORY_CARE_PROVIDER_SITE_OTHER): Payer: Medicare Other | Admitting: *Deleted

## 2018-07-03 ENCOUNTER — Other Ambulatory Visit: Payer: Self-pay

## 2018-07-03 DIAGNOSIS — Z5181 Encounter for therapeutic drug level monitoring: Secondary | ICD-10-CM | POA: Diagnosis not present

## 2018-07-03 DIAGNOSIS — I4819 Other persistent atrial fibrillation: Secondary | ICD-10-CM

## 2018-07-03 DIAGNOSIS — Z9889 Other specified postprocedural states: Secondary | ICD-10-CM

## 2018-07-03 LAB — POCT INR: INR: 1.6 — AB (ref 2.0–3.0)

## 2018-07-03 NOTE — Patient Instructions (Signed)
Increase coumadin to 2 tablets daily except 1 1/2 tablets on Mondays, Wednesdays and Fridays Recheck in 1 week

## 2018-07-04 DIAGNOSIS — Z6822 Body mass index (BMI) 22.0-22.9, adult: Secondary | ICD-10-CM | POA: Diagnosis not present

## 2018-07-04 DIAGNOSIS — R978 Other abnormal tumor markers: Secondary | ICD-10-CM | POA: Diagnosis not present

## 2018-07-04 DIAGNOSIS — I5032 Chronic diastolic (congestive) heart failure: Secondary | ICD-10-CM | POA: Diagnosis not present

## 2018-07-04 DIAGNOSIS — R634 Abnormal weight loss: Secondary | ICD-10-CM | POA: Diagnosis not present

## 2018-07-04 DIAGNOSIS — R229 Localized swelling, mass and lump, unspecified: Secondary | ICD-10-CM | POA: Diagnosis not present

## 2018-07-07 ENCOUNTER — Encounter: Payer: Self-pay | Admitting: "Endocrinology

## 2018-07-07 ENCOUNTER — Ambulatory Visit (INDEPENDENT_AMBULATORY_CARE_PROVIDER_SITE_OTHER): Payer: Medicare Other | Admitting: "Endocrinology

## 2018-07-07 ENCOUNTER — Other Ambulatory Visit: Payer: Self-pay

## 2018-07-07 VITALS — BP 142/69 | HR 79 | Ht 62.0 in | Wt 122.0 lb

## 2018-07-07 DIAGNOSIS — E032 Hypothyroidism due to medicaments and other exogenous substances: Secondary | ICD-10-CM | POA: Diagnosis not present

## 2018-07-07 MED ORDER — LEVOTHYROXINE SODIUM 100 MCG PO TABS: 100.0000 ug | ORAL_TABLET | Freq: Every day | ORAL | 3 refills | Status: DC

## 2018-07-07 NOTE — Progress Notes (Signed)
Endocrinology follow up Note                                            07/07/2018, 10:50 AM   Subjective:    Patient ID: [name] [name], female    DOB: 1941/01/21, PCP Glenda Chroman, MD   Past Medical History:  Diagnosis Date  . Anxiety   . Arthritis   . Asthma   . Atrial fibrillation, persistent   . Chronic diastolic congestive heart failure ([place])   . Colon adenoma   . Colon cancer ([place])    status post low anterior resection, limited stage disease requiring no adjuvant therapy  . COPD (chronic obstructive pulmonary disease) ([place])   . Depression   . Diverticulosis   . DM (dermatomyositis)   . DVT (deep venous thrombosis) ([name])    in leg- long time ago  . Dyspnea    with activity  . Dysrhythmia   . Esophageal dysphagia   . GERD (gastroesophageal reflux disease)   . Headache   . Heart murmur   . Hematuria   . Hemorrhoids   . Hiatal hernia   . History of kidney stones    x 2  . Hypercholesterolemia   . Incidental pulmonary nodule 05/08/2016   8 mm vague opacity RML noted on CT scan  . Mitral regurgitation   . PONV (postoperative nausea and vomiting) 2003 ish    with breast biopsy  . S/P minimally invasive maze operation for atrial fibrillation 07/05/2016   Complete bilateral atrial lesion set using cryothermy and bipolar radiofrequency ablation with clipping of LA appendage via right mini thoracotomy approach  . 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 Schatzki's ring   . Tricuspid regurgitation    Past Surgical History:  Procedure Laterality Date  . APPENDECTOMY    . BREAST SURGERY Right 2003ish   biopsy  . CARDIAC CATHETERIZATION N/A 04/12/2016   Procedure: Right/Left Heart Cath and Coronary Angiography;  Surgeon: Leonie Man, MD;  Location: [facility];  Service:  Cardiovascular;  Laterality: N/A;  . CARDIOVERSION N/A 08/16/2017   Procedure: CARDIOVERSION;  Surgeon: Arnoldo Lenis, MD;  Location: [name];  Service: Endoscopy;  Laterality: N/A;  . CATARACT EXTRACTION Bilateral 2017  . CLIPPING OF ATRIAL APPENDAGE  07/05/2016   Procedure: CLIPPING OF ATRIAL APPENDAGE;  Surgeon: Rexene Alberts, MD;  Location: [facility];  Service: Open Heart Surgery;;  . COLONOSCOPY  11/09   Dr. Gala Romney- external hemorrhoidal tags o/w normal rectal mucosa, s/p surgical resection with a normal appearing anastomosis 12cm, pan colonic diverticulum  . COLONOSCOPY N/A 06/02/2012   [id number] post low anterior resection. Pancolonic diverticulosis. Colonic polyp-tubular adenoma. Surveillance due 2019.   Marland Kitchen COLONOSCOPY N/A 10/13/2015   Procedure: COLONOSCOPY;  Surgeon: Daneil Dolin, MD;  [name];  Service: Endoscopy;  Laterality: N/A;  0930  . ESOPHAGOGASTRODUODENOSCOPY  07/2007   Dr. Daiva Nakayama cervical esophageal web, schatzki ring, large hiatal hernia  . INGUNAL HERNIA REPAIR Left   . LOW ANTERIOR BOWEL RESECTION     NO ADJ CHEMO  . MINIMALLY INVASIVE MAZE PROCEDURE N/A 07/05/2016   Procedure: MINIMALLY INVASIVE MAZE PROCEDURE;  Surgeon: Rexene Alberts,  MD;  Location: [facility];  Service: Open Heart Surgery;  Laterality: N/A;  . MITRAL VALVE REPAIR Right 07/05/2016   Procedure: MINIMALLY INVASIVE MITRAL VALVE REPAIR (MVR);  Surgeon: Rexene Alberts, MD;  Location: [facility];  Service: Open Heart Surgery;  Laterality: Right;  . MULTIPLE EXTRACTIONS WITH ALVEOLOPLASTY N/A 05/21/2016   Procedure: MULTIPLE EXTRACTION OF TOOTH #'S 5, 21 WITH ALVEOLOPLASTY AND GROSS DEBRIDEMENT OF TEETH;  Surgeon: Lenn Cal, DDS;  Location: [facility];  Service: Oral Surgery;  Laterality: N/A;  . TEE WITHOUT CARDIOVERSION N/A 02/20/2016   Procedure: TRANSESOPHAGEAL ECHOCARDIOGRAM (TEE);  Surgeon: Arnoldo Lenis, MD;  [name];  Service: Endoscopy;  Laterality: N/A;  . TEE  WITHOUT CARDIOVERSION N/A 07/05/2016   Procedure: TRANSESOPHAGEAL ECHOCARDIOGRAM (TEE);  Surgeon: Rexene Alberts, MD;  Location: [facility];  Service: Open Heart Surgery;  Laterality: N/A;  . TOOTH EXTRACTION    . TUBAL LIGATION    . VEIN LIGATION AND STRIPPING     Social History   Socioeconomic History  . Marital status: Married    Spouse name: Not on file  . Number of children: 2  . Years of education: Not on file  . Highest education level: Not on file  Occupational History  . Not on file  Social Needs  . Financial resource strain: Not on file  . Food insecurity:    Worry: Not on file    Inability: Not on file  . Transportation needs:    Medical: Not on file    Non-medical: Not on file  Tobacco Use  . Smoking status: Never Smoker  . Smokeless tobacco: Never Used  Substance and Sexual Activity  . Alcohol use: No    Alcohol/week: 0.0 standard drinks  . Drug use: No  . Sexual activity: Not on file  Lifestyle  . Physical activity:    Days per week: Not on file    Minutes per session: Not on file  . Stress: Not on file  Relationships  . Social connections:    Talks on phone: Not on file    Gets together: Not on file    Attends religious service: Not on file    Active member of club or organization: Not on file    Attends meetings of clubs or organizations: Not on file    Relationship status: Not on file  Other Topics Concern  . Not on file  Social History Narrative  . Not on file   Outpatient Encounter Medications as of 07/07/2018  Medication Sig  . acetaminophen (TYLENOL) 500 MG tablet Take 1,000 mg by mouth 2 (two) times daily as needed for headache.  . ALPRAZolam (XANAX) 0.5 MG tablet Take 0.5 mg by mouth 2 (two) times daily as needed (for anxiety/sleep (scheduled at bedtime)).   Marland Kitchen amiodarone (PACERONE) 200 MG tablet Take One Tablet Daily Monday -Friday None on Sat or Sun  . aspirin EC 81 MG EC tablet Take 1 tablet (81 mg total) by mouth daily.  . calcium carbonate  (OS-CAL) 600 MG TABS tablet Take 600 mg by mouth 2 (two) times daily with a meal.  . Coenzyme Q10 100 MG TABS Take 100 mg by mouth every evening.   . furosemide (LASIX) 40 MG tablet Take 40 mg by mouth 2 (two) times daily as needed.  Marland Kitchen levothyroxine (SYNTHROID, LEVOTHROID) 100 MCG tablet Take 1 tablet (100 mcg total) by mouth daily.  . Multiple Vitamin (MULTIVITAMIN) tablet Take 1 tablet by mouth daily.    Marland Kitchen  Polyethyl Glycol-Propyl Glycol (SYSTANE ULTRA) 0.4-0.3 % SOLN Place 1 drop into both eyes daily as needed (dry eyes).   . simvastatin (ZOCOR) 40 MG tablet Take 40 mg daily by mouth.  . sodium chloride (OCEAN) 0.65 % SOLN nasal spray Place 1 spray into both nostrils as needed for congestion.  . traMADol (ULTRAM) 50 MG tablet Take 1 tablet (50 mg total) by mouth every 6 (six) hours as needed. (Patient taking differently: Take 50 mg by mouth every 6 (six) hours as needed for moderate pain. )  . warfarin (COUMADIN) 2.5 MG tablet Take 1 or 1 1/2 tablets daily or as directed by coumadin clinic  . [DISCONTINUED] albuterol (PROVENTIL HFA;VENTOLIN HFA) 108 (90 BASE) MCG/ACT inhaler Inhale 2 puffs into the lungs every 6 (six) hours as needed for wheezing.  . [DISCONTINUED] albuterol (PROVENTIL) (2.5 MG/3ML) 0.083% nebulizer solution Inhale 3 mLs into the lungs at bedtime. May inhale a second dose during the day as needed for shortness of breath  . [DISCONTINUED] Calcium Carbonate-Vitamin D 600-400 MG-UNIT tablet Take 1 tablet by mouth 2 (two) times daily.  . [DISCONTINUED] docusate sodium (COLACE) 100 MG capsule Take 200 mg by mouth daily as needed for mild constipation.   . [DISCONTINUED] hydrocortisone (ANUSOL-HC) 2.5 % rectal cream Place 1 application rectally 2 (two) times daily. FOR UP TO 10 DAYS AT A TIME (Patient taking differently: Place 1 application rectally 2 (two) times daily as needed for hemorrhoids or itching. FOR UP TO 10 DAYS AT A TIME)  . [DISCONTINUED] KLOR-CON M20 20 MEQ tablet TAKE 1  TABLET BY MOUTH TWICE A DAY  . [DISCONTINUED] levothyroxine (SYNTHROID, LEVOTHROID) 125 MCG tablet Take 1 tablet by mouth daily.   . [DISCONTINUED] omeprazole (PRILOSEC) 20 MG capsule Take 1 capsule (20 mg total) by mouth daily.  . [DISCONTINUED] PARoxetine (PAXIL) 20 MG tablet Take 20 mg by mouth daily.    No facility-administered encounter medications on file as of 07/07/2018.    ALLERGIES: Allergies  Allergen Reactions  . Iohexol Hives and Shortness Of Breath     Pt. had a severe allergic reaction to IV contrast the last time she was injected and had to be seen in the ER.   Marland Kitchen Hydrocodone-Acetaminophen Hives  . Nabumetone Rash  . Prednisone Rash    VACCINATION STATUS:  There is no immunization history on file for this patient.  HPI [name] [name] is 78 y.o. female who presents today with repeat thyroid function tests for follow-up.  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   -During her last visit, she was kept on levothyroxine 75 mcg p.o. nightly.  In the interval her levothyroxine dose is increased to 125 mcg p.o. every morning by her PMD.  She presents with unintentional weight loss of about 30 pounds.  She reports some anxiety, tremors.  Her previsit thyroid function tests are consistent with over replacement.  -She reports easy bruising.  She is on multiple medications including anticoagulation with warfarin related to her cardiac surgery for tricuspid regurgitation as well as atrial fibrillation. -Her current medication list does not include active amiodarone, however she has not seen her cardiologist for more than 6 months.      Review of Systems  Constitutional: + Weight loss , +anxiety, + fatigue, no subjective hyperthermia, no subjective hypothermia Eyes: no blurry vision, no xerophthalmia ENT: no sore throat, no nodules palpated  in throat,  no dysphagia/odynophagia, no hoarseness Musculoskeletal: no muscle/joint aches Skin: no rashes Neurological: no tremors, no numbness, no tingling, no dizziness Psychiatric: no depression, no anxiety  Objective:    BP (!) 142/69   Pulse 79   Ht 5\' 2"  (1.575 m)   Wt 122 lb (55.3 kg)   BMI 22.31 kg/m   Wt Readings from Last 3 Encounters:  07/07/18 122 lb (55.3 kg)  03/12/18 145 lb (65.8 kg)  01/13/18 150 lb 9.6 oz (68.3 kg)    Physical Exam  Constitutional: + Weight gain,  not in acute distress, normal state of mind Eyes: PERRLA, EOMI, no exophthalmos ENT: moist mucous membranes, no thyromegaly, no cervical lymphadenopathy  Musculoskeletal: no gross deformities, strength intact in all four extremities Skin: moist, warm, no rashes Neurological: no tremor with outstretched hands, Deep tendon reflexes normal in all four extremities.  CMP ( most recent) CMP     Component Value Date/Time   NA 144 08/12/2017 1409   K 3.8 08/12/2017 1409   CL 100 (L) 08/12/2017 1409   CO2 27 08/12/2017 1409   GLUCOSE 96 08/12/2017 1409   BUN 15 08/12/2017 1409   CREATININE 1.01 (H) 08/12/2017 1409   CALCIUM 9.2 08/12/2017 1409   PROT 6.7 07/03/2016 1157   ALBUMIN 3.8 07/03/2016 1157   AST 23 07/03/2016 1157   ALT 19 07/03/2016 1157   ALKPHOS 113 07/03/2016 1157   BILITOT 0.5 07/03/2016 1157   GFRNONAA 52 (L) 08/12/2017 1409   GFRAA >60 08/12/2017 1409     Diabetic Labs (most recent): Lab Results  Component Value Date   HGBA1C 5.6 07/03/2016    Lab Results  Component Value Date   TSH 0.311 (L) 06/30/2018   TSH 2.700 03/13/2017   TSH 3.327 07/27/2016   FREET4 1.23 06/30/2018    Recent Results (from the past 2160 hour(s))  POCT INR     Status: Abnormal   Collection Time: 04/15/18 10:19 AM  Result Value Ref Range   INR 1.6 (A) 2.0 - 3.0  POCT INR     Status: Abnormal   Collection Time: 05/01/18  9:58 AM  Result Value Ref Range   INR 1.8 (A) 2.0 - 3.0  POCT INR      Status: None   Collection Time: 05/15/18 11:02 AM  Result Value Ref Range   INR 2.1 2.0 - 3.0  POCT INR     Status: Abnormal   Collection Time: 06/05/18 10:50 AM  Result Value Ref Range   INR 1.2 (A) 2.0 - 3.0  POCT INR     Status: Abnormal   Collection Time: 06/19/18 11:44 AM  Result Value Ref Range   INR 1.4 (A) 2.0 - 3.0  POCT INR     Status: Abnormal   Collection Time: 06/26/18  9:19 AM  Result Value Ref Range   INR 1.6 (A) 2.0 - 3.0  T4, free     Status: None   Collection Time: 06/30/18  2:21 PM  Result Value Ref Range   Free T4 1.23 0.82 - 1.77 ng/dL    Comment: (NOTE) Biotin ingestion may interfere with free T4 tests. If the results are inconsistent with the TSH level, previous test results, or the clinical presentation, then consider biotin interference. If needed, order repeat testing after stopping biotin. Performed at [facility], [place] [address], [place], [place] [address]   TSH     Status: Abnormal   Collection Time: 06/30/18  2:22 PM  Result Value Ref  Range   TSH 0.311 (L) 0.350 - 4.500 uIU/mL    Comment: Performed by a 3rd Generation assay with a functional sensitivity of <=0.01 uIU/mL. Performed at [facility], [address] [place], [place] [address]   POCT INR     Status: Abnormal   Collection Time: 07/03/18  3:06 PM  Result Value Ref Range   INR 1.6 (A) 2.0 - 3.0      Assessment & Plan:   1.  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  -Her 24-hour urine free cortisol is within normal limits ruling out Cushing's syndrome despite her history of exposure to inhaled steroids to treat COPD. -She will not require any adrenal intervention at this time.  She does so many nonspecific symptoms related to her other several comorbidities.    Regarding her hypothyroidism likely induced by amiodarone treatment.  Her thyroid function tests are consistent with excessive  replacement.  -I discussed and lowered her levothyroxine to 100 mcg p.o. every morning.    - We discussed about the correct intake of her thyroid hormone, on empty stomach at fasting, with water, separated by at least 30 minutes from breakfast and other medications,  and separated by more than 4 hours from calcium, iron, multivitamins, acid reflux medications (PPIs). -Patient is made aware of the fact that thyroid hormone replacement is needed for life, dose to be adjusted by periodic monitoring of thyroid function tests.   - I advised her  to maintain close follow up with Glenda Chroman, MD for primary care needs.  I encouraged her to resume her follow-up with her cardiologist regarding her history of atrial fibrillation which required treatment with amiodarone.   Natesha Hassey Imbert participated in the discussions, expressed understanding, and voiced agreement with the above plans.  All questions were answered to her satisfaction. she is encouraged to contact clinic should she have any questions or concerns prior to her return visit.   Follow up plan: Return in about 3 months (around 10/07/2018) for Follow up with Pre-visit Labs.   Glade Lloyd, MD [facility] [address] [place], [place] [address] Phone: [phone]  Fax: [phone]     07/07/2018, 10:50 AM  This note was partially dictated with voice recognition software. Similar sounding words can be transcribed inadequately or may not  be corrected upon review.

## 2018-07-10 ENCOUNTER — Other Ambulatory Visit: Payer: Self-pay

## 2018-07-10 ENCOUNTER — Ambulatory Visit (INDEPENDENT_AMBULATORY_CARE_PROVIDER_SITE_OTHER): Payer: Medicare Other | Admitting: *Deleted

## 2018-07-10 DIAGNOSIS — I4819 Other persistent atrial fibrillation: Secondary | ICD-10-CM

## 2018-07-10 DIAGNOSIS — Z5181 Encounter for therapeutic drug level monitoring: Secondary | ICD-10-CM | POA: Diagnosis not present

## 2018-07-10 DIAGNOSIS — Z9889 Other specified postprocedural states: Secondary | ICD-10-CM | POA: Diagnosis not present

## 2018-07-10 LAB — POCT INR: INR: 1.8 — AB (ref 2.0–3.0)

## 2018-07-10 NOTE — Patient Instructions (Signed)
Increase coumadin to 2 tablets daily Recheck in 1 week

## 2018-07-14 ENCOUNTER — Ambulatory Visit: Payer: Medicare Other | Admitting: Nurse Practitioner

## 2018-07-15 ENCOUNTER — Telehealth: Payer: Self-pay | Admitting: Pharmacist

## 2018-07-15 NOTE — Telephone Encounter (Signed)

## 2018-07-16 DIAGNOSIS — E876 Hypokalemia: Secondary | ICD-10-CM | POA: Diagnosis not present

## 2018-07-17 ENCOUNTER — Ambulatory Visit (INDEPENDENT_AMBULATORY_CARE_PROVIDER_SITE_OTHER): Payer: Medicare Other | Admitting: *Deleted

## 2018-07-17 DIAGNOSIS — R229 Localized swelling, mass and lump, unspecified: Secondary | ICD-10-CM | POA: Diagnosis not present

## 2018-07-17 DIAGNOSIS — Z9889 Other specified postprocedural states: Secondary | ICD-10-CM

## 2018-07-17 DIAGNOSIS — Z5181 Encounter for therapeutic drug level monitoring: Secondary | ICD-10-CM | POA: Diagnosis not present

## 2018-07-17 DIAGNOSIS — R2232 Localized swelling, mass and lump, left upper limb: Secondary | ICD-10-CM | POA: Diagnosis not present

## 2018-07-17 DIAGNOSIS — R2231 Localized swelling, mass and lump, right upper limb: Secondary | ICD-10-CM | POA: Diagnosis not present

## 2018-07-17 DIAGNOSIS — I4819 Other persistent atrial fibrillation: Secondary | ICD-10-CM | POA: Diagnosis not present

## 2018-07-17 LAB — POCT INR: INR: 2.8 (ref 2.0–3.0)

## 2018-07-17 NOTE — Patient Instructions (Signed)
Decrease coumadin to 2 tablets daily except 1 tablet on Fridays Recheck in 1 week

## 2018-07-18 DIAGNOSIS — J449 Chronic obstructive pulmonary disease, unspecified: Secondary | ICD-10-CM | POA: Diagnosis not present

## 2018-07-18 DIAGNOSIS — E7800 Pure hypercholesterolemia, unspecified: Secondary | ICD-10-CM | POA: Diagnosis not present

## 2018-07-18 DIAGNOSIS — I4891 Unspecified atrial fibrillation: Secondary | ICD-10-CM | POA: Diagnosis not present

## 2018-07-18 DIAGNOSIS — M159 Polyosteoarthritis, unspecified: Secondary | ICD-10-CM | POA: Diagnosis not present

## 2018-07-22 DIAGNOSIS — D225 Melanocytic nevi of trunk: Secondary | ICD-10-CM | POA: Diagnosis not present

## 2018-07-22 DIAGNOSIS — D485 Neoplasm of uncertain behavior of skin: Secondary | ICD-10-CM | POA: Diagnosis not present

## 2018-07-22 DIAGNOSIS — D2271 Melanocytic nevi of right lower limb, including hip: Secondary | ICD-10-CM | POA: Diagnosis not present

## 2018-07-22 DIAGNOSIS — L570 Actinic keratosis: Secondary | ICD-10-CM | POA: Diagnosis not present

## 2018-07-22 DIAGNOSIS — L280 Lichen simplex chronicus: Secondary | ICD-10-CM | POA: Diagnosis not present

## 2018-07-22 DIAGNOSIS — D179 Benign lipomatous neoplasm, unspecified: Secondary | ICD-10-CM | POA: Diagnosis not present

## 2018-07-23 ENCOUNTER — Telehealth: Payer: Self-pay | Admitting: *Deleted

## 2018-07-23 DIAGNOSIS — N183 Chronic kidney disease, stage 3 (moderate): Secondary | ICD-10-CM | POA: Diagnosis not present

## 2018-07-23 DIAGNOSIS — L03116 Cellulitis of left lower limb: Secondary | ICD-10-CM | POA: Diagnosis not present

## 2018-07-23 DIAGNOSIS — Z299 Encounter for prophylactic measures, unspecified: Secondary | ICD-10-CM | POA: Diagnosis not present

## 2018-07-23 DIAGNOSIS — Z789 Other specified health status: Secondary | ICD-10-CM | POA: Diagnosis not present

## 2018-07-23 DIAGNOSIS — J449 Chronic obstructive pulmonary disease, unspecified: Secondary | ICD-10-CM | POA: Diagnosis not present

## 2018-07-23 NOTE — Telephone Encounter (Signed)
° °  _____________   [id number] Pre-Screening Questions:   Do you currently have a fever? yes = cancel and refer to pcp for e-visit)  NO  Have you recently travelled on a cruise, internationally, or to [place], [place], [place], [place], [place], or [place], [place] [employer]) ?O (yes = cancel, stay home, monitor symptoms, and contact pcp or initiate e-visit if symptoms develop) NO  Have you been in contact with someone that is currently pending confirmation of Covid19 testing or has been confirmed to have the [place] virus?  Oyes = cancel, stay home, away from tested individual, monitor symptoms, and contact pcp or initiate e-visit if symptoms develop) NO  Are you currently experiencing fatigue or cough? O (yes = pt should be prepared to have a mask placed at the time of their visit).NO

## 2018-07-24 ENCOUNTER — Ambulatory Visit (INDEPENDENT_AMBULATORY_CARE_PROVIDER_SITE_OTHER): Payer: Medicare Other | Admitting: *Deleted

## 2018-07-24 ENCOUNTER — Other Ambulatory Visit: Payer: Self-pay

## 2018-07-24 DIAGNOSIS — I4819 Other persistent atrial fibrillation: Secondary | ICD-10-CM

## 2018-07-24 DIAGNOSIS — Z9889 Other specified postprocedural states: Secondary | ICD-10-CM | POA: Diagnosis not present

## 2018-07-24 DIAGNOSIS — Z5181 Encounter for therapeutic drug level monitoring: Secondary | ICD-10-CM

## 2018-07-24 LAB — POCT INR: INR: 2.9 (ref 2.0–3.0)

## 2018-07-24 NOTE — Patient Instructions (Signed)
Continue coumadin 2 tablets daily except 1 tablet on Fridays Recheck in 3 weeks

## 2018-07-30 DIAGNOSIS — R634 Abnormal weight loss: Secondary | ICD-10-CM | POA: Diagnosis not present

## 2018-07-30 DIAGNOSIS — R591 Generalized enlarged lymph nodes: Secondary | ICD-10-CM | POA: Diagnosis not present

## 2018-07-30 DIAGNOSIS — Z6822 Body mass index (BMI) 22.0-22.9, adult: Secondary | ICD-10-CM | POA: Diagnosis not present

## 2018-08-04 DIAGNOSIS — I4891 Unspecified atrial fibrillation: Secondary | ICD-10-CM | POA: Diagnosis not present

## 2018-08-04 DIAGNOSIS — Z299 Encounter for prophylactic measures, unspecified: Secondary | ICD-10-CM | POA: Diagnosis not present

## 2018-08-04 DIAGNOSIS — F411 Generalized anxiety disorder: Secondary | ICD-10-CM | POA: Diagnosis not present

## 2018-08-04 DIAGNOSIS — B029 Zoster without complications: Secondary | ICD-10-CM | POA: Diagnosis not present

## 2018-08-04 DIAGNOSIS — Z713 Dietary counseling and surveillance: Secondary | ICD-10-CM | POA: Diagnosis not present

## 2018-08-13 ENCOUNTER — Telehealth: Payer: Self-pay | Admitting: *Deleted

## 2018-08-13 NOTE — Telephone Encounter (Signed)
° °  COVID-19 Pre-Screening Questions: ° °• Do you currently have a fever?NO ° ° °• Have you recently travelled on a cruise, internationally, or to [place], [place], [place], [place], [place], or [place], [place] ([employer]) ? NO °•  °• Have you been in contact with someone that is currently pending confirmation of Covid19 testing or has been confirmed to have the Covid19 virus?  NO °•  °Are you currently experiencing fatigue or cough? NO ° ° °   ° ° ° ° °

## 2018-08-14 ENCOUNTER — Ambulatory Visit (INDEPENDENT_AMBULATORY_CARE_PROVIDER_SITE_OTHER): Payer: Medicare Other | Admitting: *Deleted

## 2018-08-14 DIAGNOSIS — Z9889 Other specified postprocedural states: Secondary | ICD-10-CM

## 2018-08-14 DIAGNOSIS — Z5181 Encounter for therapeutic drug level monitoring: Secondary | ICD-10-CM | POA: Diagnosis not present

## 2018-08-14 DIAGNOSIS — I4819 Other persistent atrial fibrillation: Secondary | ICD-10-CM | POA: Diagnosis not present

## 2018-08-14 LAB — POCT INR: INR: 2.6 (ref 2.0–3.0)

## 2018-08-14 NOTE — Patient Instructions (Signed)
Continue coumadin 2 tablets daily except 1 tablet on Fridays Recheck in 3 weeks

## 2018-08-15 DIAGNOSIS — E7800 Pure hypercholesterolemia, unspecified: Secondary | ICD-10-CM | POA: Diagnosis not present

## 2018-08-15 DIAGNOSIS — J449 Chronic obstructive pulmonary disease, unspecified: Secondary | ICD-10-CM | POA: Diagnosis not present

## 2018-08-15 DIAGNOSIS — M159 Polyosteoarthritis, unspecified: Secondary | ICD-10-CM | POA: Diagnosis not present

## 2018-08-15 DIAGNOSIS — I4891 Unspecified atrial fibrillation: Secondary | ICD-10-CM | POA: Diagnosis not present

## 2018-08-18 ENCOUNTER — Other Ambulatory Visit: Payer: Self-pay | Admitting: Cardiology

## 2018-08-18 DIAGNOSIS — H26491 Other secondary cataract, right eye: Secondary | ICD-10-CM | POA: Diagnosis not present

## 2018-08-18 DIAGNOSIS — H40013 Open angle with borderline findings, low risk, bilateral: Secondary | ICD-10-CM | POA: Diagnosis not present

## 2018-08-18 DIAGNOSIS — H35033 Hypertensive retinopathy, bilateral: Secondary | ICD-10-CM | POA: Diagnosis not present

## 2018-08-18 DIAGNOSIS — H2640 Unspecified secondary cataract: Secondary | ICD-10-CM | POA: Diagnosis not present

## 2018-08-18 DIAGNOSIS — H35363 Drusen (degenerative) of macula, bilateral: Secondary | ICD-10-CM | POA: Diagnosis not present

## 2018-09-01 ENCOUNTER — Ambulatory Visit (INDEPENDENT_AMBULATORY_CARE_PROVIDER_SITE_OTHER): Payer: Medicare Other | Admitting: *Deleted

## 2018-09-01 ENCOUNTER — Other Ambulatory Visit: Payer: Self-pay

## 2018-09-01 DIAGNOSIS — Z9889 Other specified postprocedural states: Secondary | ICD-10-CM | POA: Diagnosis not present

## 2018-09-01 DIAGNOSIS — Z5181 Encounter for therapeutic drug level monitoring: Secondary | ICD-10-CM

## 2018-09-01 DIAGNOSIS — I4819 Other persistent atrial fibrillation: Secondary | ICD-10-CM

## 2018-09-01 LAB — POCT INR: INR: 1.6 — AB (ref 2.0–3.0)

## 2018-09-01 NOTE — Patient Instructions (Signed)
Take 3 tablets tonight and tomorrow night then resume 2 tablets daily except 1 tablet on Fridays Recheck in 3 weeks

## 2018-09-08 ENCOUNTER — Telehealth: Payer: Self-pay | Admitting: Cardiology

## 2018-09-08 DIAGNOSIS — L03116 Cellulitis of left lower limb: Secondary | ICD-10-CM | POA: Diagnosis not present

## 2018-09-08 DIAGNOSIS — Z299 Encounter for prophylactic measures, unspecified: Secondary | ICD-10-CM | POA: Diagnosis not present

## 2018-09-08 DIAGNOSIS — Z789 Other specified health status: Secondary | ICD-10-CM | POA: Diagnosis not present

## 2018-09-08 DIAGNOSIS — R609 Edema, unspecified: Secondary | ICD-10-CM | POA: Diagnosis not present

## 2018-09-08 DIAGNOSIS — Z6823 Body mass index (BMI) 23.0-23.9, adult: Secondary | ICD-10-CM | POA: Diagnosis not present

## 2018-09-08 DIAGNOSIS — J449 Chronic obstructive pulmonary disease, unspecified: Secondary | ICD-10-CM | POA: Diagnosis not present

## 2018-09-08 DIAGNOSIS — B029 Zoster without complications: Secondary | ICD-10-CM | POA: Diagnosis not present

## 2018-09-08 NOTE — Telephone Encounter (Signed)
Can do a virtual visit tomorrow at Jerry Caras MD

## 2018-09-08 NOTE — Telephone Encounter (Signed)
No active chest pain currently.  Mild chest pain off/on since the weekend.  Rated 2/10.  Is not prescribed Nitroglycerin.  No SOB or dizziness.  Not able to relate to activity.  No fatigue, tiredness or weight gain.

## 2018-09-08 NOTE — Telephone Encounter (Signed)
Patient notified & agrees to vv tomorrow.  The patient verbally consented for a telehealth phone visit with [facility] and understands that his/her insurance company will be billed for the encounter.  She will have vitals, weight & medications ready.

## 2018-09-08 NOTE — Telephone Encounter (Signed)
Patient would like to speak with nurse regarding having chest pain/tg

## 2018-09-09 ENCOUNTER — Encounter: Payer: Self-pay | Admitting: *Deleted

## 2018-09-09 ENCOUNTER — Telehealth (INDEPENDENT_AMBULATORY_CARE_PROVIDER_SITE_OTHER): Payer: Medicare Other | Admitting: Cardiology

## 2018-09-09 ENCOUNTER — Encounter: Payer: Self-pay | Admitting: Cardiology

## 2018-09-09 VITALS — BP 127/89 | HR 92 | Ht 61.0 in | Wt 120.0 lb

## 2018-09-09 DIAGNOSIS — R0789 Other chest pain: Secondary | ICD-10-CM

## 2018-09-09 DIAGNOSIS — I4891 Unspecified atrial fibrillation: Secondary | ICD-10-CM

## 2018-09-09 DIAGNOSIS — I340 Nonrheumatic mitral (valve) insufficiency: Secondary | ICD-10-CM

## 2018-09-09 DIAGNOSIS — I5032 Chronic diastolic (congestive) heart failure: Secondary | ICD-10-CM

## 2018-09-09 NOTE — Patient Instructions (Signed)
Your physician recommends that you schedule a follow-up appointment in: 3 MONTHS WITH DR BRANCH  Your physician recommends that you continue on your current medications as directed. Please refer to the Current Medication list given to you today.  Thank you for choosing [facility]!!    

## 2018-09-09 NOTE — Progress Notes (Signed)
Virtual Visit via Telephone Note   This visit type was conducted due to national recommendations for restrictions regarding the COVID-19 Pandemic (e.g. social distancing) in an effort to limit this patient's exposure and mitigate transmission in our community.  Due to her co-morbid illnesses, this patient is at least at moderate risk for complications without adequate follow up.  This format is felt to be most appropriate for this patient at this time.  The patient did not have access to video technology/had technical difficulties with video requiring transitioning to audio format only (telephone).  All issues noted in this document were discussed and addressed.  No physical exam could be performed with this format.  Please refer to the patient's chart for her  consent to telehealth for [facility].   Date:  09/09/2018   ID:  [name] [name], DOB 1940/09/07, MRN [id number]  Patient Location: Home Provider Location: Home  PCP:  Glenda Chroman, MD  Cardiologist:  Carlyle Dolly, MD  Electrophysiologist:  Cristopher Peru, MD   Evaluation Performed:  Follow-Up Visit  Chief Complaint:  Chest pain  History of Present Illness:    CHARDAY CAPETILLO is a 78 y.o. female with seen today for follow up of the following medical problems.   1. Chest pain - 2017 cath minimal CAD, distal LAD bridging  - started on Friday. Started while at home. Sharp pain midchest, 2/10 in severity. Lasts for about 1-2 seconds. No SOB or DOE. Not positional. No DOE.  - had shingles last month, some ongoing areas.     2. Mitral regurgitation, s/p MV repair - echo at last [facility] Jan 2017 showed normal LVEF, 3+(modarte)eccentric MR with immobile posterior leaflet.  - repeat echo 01/2016 moderate to severe MR. LVEF 60-65%. LVIDs 31. Symptoms unchanged since last visit.  - 01/2016 TEE moderate to severe eccentric MR, severe TR.  - she is s/p mitral valve repair 07/05/16, Sorin Memo 3D Ring Annuloplasty  (size 54mm, catalog #[id number], serial [id number]) - repeat echo 10/2016 with LVEF 60-65%, no WMAs, normal MV repair and ring, moderate TR.     - no recent edema - weight down to 120 lbs, down from 145 lbs. PCP has been evaluate her weight loss   3.Paroxysmal afib/ Aflutter - s/p MAZE procedure during recent MV repair   - no recent palpitations - no bleeding on coumadin - followed by EP  4 Sinus node dysfunction - followed by EP, watchful waiting. Likely will require ppm at some point   5. Chronic diastolic HF - no recent symptoms  6. History of GI bleed -followed by GI - notes indicate hemorroidal bleeding  - no recent bleeding  7. COPD -followed by Dr Luan Pulling   8. HTN -she is compliant with meds    The patient does not have symptoms concerning for COVID-19 infection (fever, chills, cough, or new shortness of breath).    Past Medical History:  Diagnosis Date  . Anxiety   . Arthritis   . Asthma   . Atrial fibrillation, persistent   . Chronic diastolic congestive heart failure ([place])   . Colon adenoma   . Colon cancer ([place])    status post low anterior resection, limited stage disease requiring no adjuvant therapy  . COPD (chronic obstructive pulmonary disease) ([place])   . Depression   . Diverticulosis   . DM (dermatomyositis)   . DVT (deep venous thrombosis) ([name])    in leg- long time ago  . Dyspnea    with activity  .  Dysrhythmia   . Esophageal dysphagia   . GERD (gastroesophageal reflux disease)   . Headache   . Heart murmur   . Hematuria   . Hemorrhoids   . Hiatal hernia   . History of kidney stones    x 2  . Hypercholesterolemia   . Incidental pulmonary nodule 05/08/2016   8 mm vague opacity RML noted on CT scan  . Mitral regurgitation   . PONV (postoperative nausea and vomiting) 2003 ish    with breast biopsy  . S/P minimally invasive maze operation for atrial fibrillation 07/05/2016   Complete bilateral atrial lesion set using cryothermy  and bipolar radiofrequency ablation with clipping of LA appendage via right mini thoracotomy approach  . 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 Schatzki's ring   . Tricuspid regurgitation    Past Surgical History:  Procedure Laterality Date  . APPENDECTOMY    . BREAST SURGERY Right 2003ish   biopsy  . CARDIAC CATHETERIZATION N/A 04/12/2016   Procedure: Right/Left Heart Cath and Coronary Angiography;  Surgeon: Leonie Man, MD;  Location: [facility];  Service: Cardiovascular;  Laterality: N/A;  . CARDIOVERSION N/A 08/16/2017   Procedure: CARDIOVERSION;  Surgeon: Arnoldo Lenis, MD;  Location: [name];  Service: Endoscopy;  Laterality: N/A;  . CATARACT EXTRACTION Bilateral 2017  . CLIPPING OF ATRIAL APPENDAGE  07/05/2016   Procedure: CLIPPING OF ATRIAL APPENDAGE;  Surgeon: Rexene Alberts, MD;  Location: [facility];  Service: Open Heart Surgery;;  . COLONOSCOPY  11/09   Dr. Gala Romney- external hemorrhoidal tags o/w normal rectal mucosa, s/p surgical resection with a normal appearing anastomosis 12cm, pan colonic diverticulum  . COLONOSCOPY N/A 06/02/2012   NWG:NFAOZH post low anterior resection. Pancolonic diverticulosis. Colonic polyp-tubular adenoma. Surveillance due 2019.   Marland Kitchen COLONOSCOPY N/A 10/13/2015   Procedure: COLONOSCOPY;  Surgeon: Daneil Dolin, MD;  [facility]: [name];  Service: Endoscopy;  Laterality: N/A;  0930  . ESOPHAGOGASTRODUODENOSCOPY  07/2007   Dr. Daiva Nakayama cervical esophageal web, schatzki ring, large hiatal hernia  . INGUNAL HERNIA REPAIR Left   . LOW ANTERIOR BOWEL RESECTION     NO ADJ CHEMO  . MINIMALLY INVASIVE MAZE PROCEDURE N/A 07/05/2016   Procedure: MINIMALLY INVASIVE MAZE PROCEDURE;  Surgeon: Rexene Alberts, MD;  Location: [facility];  Service: Open Heart Surgery;  Laterality: N/A;   . MITRAL VALVE REPAIR Right 07/05/2016   Procedure: MINIMALLY INVASIVE MITRAL VALVE REPAIR (MVR);  Surgeon: Rexene Alberts, MD;  Location: [facility];  Service: Open Heart Surgery;  Laterality: Right;  . MULTIPLE EXTRACTIONS WITH ALVEOLOPLASTY N/A 05/21/2016   Procedure: MULTIPLE EXTRACTION OF TOOTH #'S 5, 21 WITH ALVEOLOPLASTY AND GROSS DEBRIDEMENT OF TEETH;  Surgeon: Lenn Cal, DDS;  Location: [facility];  Service: Oral Surgery;  Laterality: N/A;  . TEE WITHOUT CARDIOVERSION N/A 02/20/2016   Procedure: TRANSESOPHAGEAL ECHOCARDIOGRAM (TEE);  Surgeon: Arnoldo Lenis, MD;  [name];  Service: Endoscopy;  Laterality: N/A;  . TEE WITHOUT CARDIOVERSION N/A 07/05/2016   Procedure: TRANSESOPHAGEAL ECHOCARDIOGRAM (TEE);  Surgeon: Rexene Alberts, MD;  Location: [facility];  Service: Open Heart Surgery;  Laterality: N/A;  . TOOTH EXTRACTION    . TUBAL LIGATION    . VEIN LIGATION AND STRIPPING       No outpatient medications have been marked as taking for  the 09/09/18 encounter (Appointment) with Arnoldo Lenis, MD.     Allergies:   Iohexol; Hydrocodone-acetaminophen; Nabumetone; and Prednisone   Social History   Tobacco Use  . Smoking status: Never Smoker  . Smokeless tobacco: Never Used  Substance Use Topics  . Alcohol use: No    Alcohol/week: 0.0 standard drinks  . Drug use: No     Family Hx: The patient's family history includes Aneurysm in her brother; Brain cancer in her sister; Breast cancer in her mother; Cancer - Lung in her sister; Prostate cancer in her brother; Rheum arthritis in her father. There is no history of Colon cancer.  ROS:   Please see the history of present illness.     All other systems reviewed and are negative.   Prior CV studies:   The following studies were reviewed today:  01/2016 echo Study Conclusions  - Left ventricle: The cavity size was normal. Wall thickness was normal. Systolic function was normal. The estimated ejection  fraction was in the range of 60% to 65%. Wall motion was normal; there were no regional wall motion abnormalities. The study is not technically sufficient to allow evaluation of LV diastolic function. Doppler parameters are consistent with high ventricular filling pressure. - Mitral valve: Calcified annulus. There was moderate to severe (closer to severe) eccentric regurgitation. - Left atrium: The atrium was severely dilated. - Right atrium: The atrium was moderately dilated. - Tricuspid valve: There was moderate-severe regurgitation. - Pulmonary arteries: PA peak pressure: 40 mm Hg (S).   01/2016 TEE eccentric moderate to severe MR. Moderate to severe TR. F/u full report for final findings.  08/22/16 echo  Study Conclusions  - Left ventricle: The cavity size was normal. Wall thickness was normal. Systolic function was normal. The estimated ejection fraction was in the range of 60% to 65%. Wall motion was normal; there were no regional wall motion abnormalities. The study is not technically sufficient to allow evaluation of LV diastolic function. - Aortic valve: Mildly calcified annulus. Trileaflet. Valve area (VTI): 1.6 cm^2. Valve area (Vmax): 1.59 cm^2. Valve area (Vmean): 1.61 cm^2. - Mitral valve: Status post complex valvuloplasty including artificial Gore-tex neochord placement and Sorin Memo 3D Ring Annuloplasty (size 38mm, catalog #[id number], serial [id number]). Mildly thickened leaflets. There was trivial regurgitation. Mean gradient (D): 4 mm Hg. - Left atrium: The atrium was mildly to moderately dilated. - Right ventricle: The cavity size was mildly dilated. - Right atrium: The atrium was at the upper limits of normal in size. Central venous pressure (est): 3 mm Hg. - Atrial septum: No defect or patent foramen ovale was identified. - Tricuspid valve: There was moderate regurgitation. - Pulmonary arteries: Systolic pressure was  moderately increased. PA peak pressure: 50 mm Hg (S). - Pericardium, extracardiac: There was no pericardial effusion.  Impressions:  - Normal LV wall thickness with LVEF 60-65%. Indeterminate diastolic function. Mild to moderate left atrial enlargement. Status post mitral valve repair and annuloplasty as indicated with mildly thickened leaflets and trivial mitral regurgitation. Mean gradient 4 mmHg. Mild right ventricular enlargement. At least moderate tricuspid regurgitation noted. Moderate pulmonary hypertension with PASP 50 mmHg.   10/2016 echo Study Conclusions  - Left ventricle: The cavity size was normal. Wall thickness was normal. Systolic function was normal. The estimated ejection fraction was in the range of 60% to 65%. Wall motion was normal; there were no regional wall motion abnormalities. The study is not technically sufficient to allow evaluation of LV diastolic function. - Aortic valve:  Moderately calcified annulus. Trileaflet. - Mitral valve: Status post complex valvuloplasty including artificial Gore-tex neochord placement and Sorin Memo 3D Ring Annuloplasty (size 66mm, catalog #[id number], serial [id number]). There was trivial regurgitation. Mean gradient (D): 4 mm Hg. - Left atrium: The atrium was mildly dilated. - Right ventricle: The cavity size was mildly dilated. Systolic function appears mildly reduced. - Right atrium: The atrium was mildly to moderately dilated. - Tricuspid valve: There was moderate regurgitation. - Pulmonary arteries: PA peak pressure: 43 mm Hg (S).  Labs/Other Tests and Data Reviewed:    EKG:  No ECG reviewed.  Recent Labs: 06/30/2018: TSH 0.311   Recent Lipid Panel No results found for: CHOL, TRIG, HDL, CHOLHDL, LDLCALC, LDLDIRECT  Wt Readings from Last 3 Encounters:  07/07/18 122 lb (55.3 kg)  03/12/18 145 lb (65.8 kg)  01/13/18 150 lb 9.6 oz (68.3 kg)     Objective:    Vital Signs:    Today's Vitals   09/09/18 0956  BP: 127/89  Pulse: 92  Weight: 120 lb (54.4 kg)  Height: 5\' 1"  (1.549 m)   Body mass index is 22.67 kg/m.  Normal affect. Normal speech pattern and tone. Comfortable, no apparent distress. No audible signs of SOB or wheezing.   ASSESSMENT & PLAN:    1. Atypical chest pain - symptoms atypical, not conistent with cardiac ischemia. Perhaps some lingering inflammation from recent bout with shingles - no further workup at this time, continue to monitor.   2. Mitral regurgitation/Mitral valve repair - s/p mitral valve repair - doing well without symptoms, continue to monitor.   3. AFib/Aflutter -no symptoms, continue current meds -f/u with EP next month   4. HTN - she is at goal, continue current meds   5. Chronic diastolic HF - no current symptoms, cotninue current therapy   COVID-19 Education: The signs and symptoms of COVID-19 were discussed with the patient and how to seek care for testing (follow up with PCP or arrange E-visit).  The importance of social distancing was discussed today.  Time:   Today, I have spent 20 minutes with the patient with telehealth technology discussing the above problems.     Medication Adjustments/Labs and Tests Ordered: Current medicines are reviewed at length with the patient today.  Concerns regarding medicines are outlined above.   Tests Ordered: No orders of the defined types were placed in this encounter.   Medication Changes: No orders of the defined types were placed in this encounter.   Disposition:  Follow up 3 months  Signed, Carlyle Dolly, MD  09/09/2018 9:10 AM    [place]

## 2018-09-22 ENCOUNTER — Other Ambulatory Visit: Payer: Self-pay

## 2018-09-22 ENCOUNTER — Ambulatory Visit (INDEPENDENT_AMBULATORY_CARE_PROVIDER_SITE_OTHER): Payer: Medicare Other | Admitting: *Deleted

## 2018-09-22 DIAGNOSIS — Z5181 Encounter for therapeutic drug level monitoring: Secondary | ICD-10-CM

## 2018-09-22 DIAGNOSIS — Z9889 Other specified postprocedural states: Secondary | ICD-10-CM

## 2018-09-22 DIAGNOSIS — I4819 Other persistent atrial fibrillation: Secondary | ICD-10-CM | POA: Diagnosis not present

## 2018-09-22 LAB — POCT INR: INR: 1.8 — AB (ref 2.0–3.0)

## 2018-09-22 NOTE — Patient Instructions (Signed)
Take 3 tablets tonight then increase dose to 2 tablets daily Recheck in 3 weeks

## 2018-09-29 ENCOUNTER — Other Ambulatory Visit (HOSPITAL_COMMUNITY)
Admission: RE | Admit: 2018-09-29 | Discharge: 2018-09-29 | Disposition: A | Discharge status: Home / Self Care | Payer: Medicare Other | Source: Ambulatory Visit | Attending: "Endocrinology | Admitting: "Endocrinology

## 2018-09-29 ENCOUNTER — Other Ambulatory Visit: Payer: Self-pay

## 2018-09-29 ENCOUNTER — Ambulatory Visit (INDEPENDENT_AMBULATORY_CARE_PROVIDER_SITE_OTHER): Payer: Medicare Other | Admitting: Nurse Practitioner

## 2018-09-29 ENCOUNTER — Telehealth: Payer: Self-pay

## 2018-09-29 ENCOUNTER — Ambulatory Visit (HOSPITAL_COMMUNITY)
Admission: RE | Admit: 2018-09-29 | Discharge: 2018-09-29 | Disposition: A | Discharge status: Home / Self Care | Payer: Medicare Other | Source: Ambulatory Visit | Attending: Nurse Practitioner | Admitting: Nurse Practitioner

## 2018-09-29 ENCOUNTER — Encounter: Payer: Self-pay | Admitting: Nurse Practitioner

## 2018-09-29 VITALS — BP 143/81 | HR 80 | Temp 97.1°F | Ht 61.0 in | Wt 120.6 lb

## 2018-09-29 DIAGNOSIS — R1031 Right lower quadrant pain: Secondary | ICD-10-CM | POA: Diagnosis not present

## 2018-09-29 DIAGNOSIS — R634 Abnormal weight loss: Secondary | ICD-10-CM

## 2018-09-29 DIAGNOSIS — K649 Unspecified hemorrhoids: Secondary | ICD-10-CM

## 2018-09-29 DIAGNOSIS — R1030 Lower abdominal pain, unspecified: Secondary | ICD-10-CM

## 2018-09-29 DIAGNOSIS — K449 Diaphragmatic hernia without obstruction or gangrene: Secondary | ICD-10-CM | POA: Diagnosis not present

## 2018-09-29 LAB — T4, FREE: Free T4: 1.27 ng/dL (ref 0.82–1.77)

## 2018-09-29 LAB — TSH: TSH: 0.084 u[IU]/mL — ABNORMAL LOW (ref 0.350–4.500)

## 2018-09-29 NOTE — Telephone Encounter (Signed)
Forwarding to [facility] and [facility].

## 2018-09-29 NOTE — Telephone Encounter (Signed)
Ok to hold coumadin 3 days prior to the procedure, we will also make our coumadin clinic aware   [name] MD

## 2018-09-29 NOTE — Assessment & Plan Note (Addendum)
The patient has acute onset right lower quadrant abdominal pain that is not associated with constipation, bowel movements, specific times a day, other known triggers.  She does have a history of pancolonic diverticulosis.  The pain typically flares up and last a couple few days, will subside for a while.  Has multiple episodes a week.  Pain is 6-7 out of 10, sometimes feels like she is going to cry.  It is a burning type pain.  She has had a PET scan with [facility] related to large lymph nodes in the left abdomen/back area.  She does have a personal history of colon cancer.  At this point I will check a CT of the abdomen and pelvis stat to check for smoldering diverticulitis that may necessitate antibiotics.  Return for follow-up in 4 to 6 weeks.  ER precautions given.  I spoke with Anderson Malta (CT tech) due to patient IV contrast allergy.  She recommended a CT abdomen and pelvis without contrast and she would give oral contrast which will allow for evaluation for diverticulitis.

## 2018-09-29 NOTE — Assessment & Plan Note (Signed)
Persistent weight loss, states she can now feel her ribs and her upper chest.  She is lost about 20 pounds since February.  At this point CT of the abdomen pelvis will help identify any concerning etiologies.  She is currently due for colonoscopy due to personal history of colon cancer and colonoscopy in 2017 with semi-pedunculated sessile polyps with recommended repeat in 2020 if overall health permits.  We will proceed with colonoscopy at this time.  Proceed with TCS on propofol/MAC with Dr. Gala Romney in near future: the risks, benefits, and alternatives have been discussed with the patient in detail. The patient states understanding and desires to proceed.  Patient is currently on Ultram and Xanax.  She is on Coumadin for atrial fibrillation and we will reach out to cardiology to hold this for 3 days prior to her procedure.  No other anticoagulants, anxiolytics, chronic pain medications, or antidepressants.  We will plan for the procedure on propofol/MAC to promote adequate sedation.

## 2018-09-29 NOTE — Assessment & Plan Note (Signed)
Hemorrhoids doing well, no recent symptoms.  Continue to monitor.  Topical therapy as needed.  Follow-up in 4 to 6 weeks.

## 2018-09-29 NOTE — Telephone Encounter (Signed)
Dr. [name],   This mutual patient was seen in our office today by Walden Field, NP.  Randall Hiss would like for him to have a colonoscopy in the very near future with Dr. Gala Romney.  Please advise if it is OK to HOLD Coumadin for 3 days prior to procedure.  Thanks so much!    Everardo All, LPN

## 2018-09-29 NOTE — Telephone Encounter (Signed)
Patient currently having CT done. Will call later to schedule

## 2018-09-29 NOTE — Progress Notes (Signed)
cc'd to pcp 

## 2018-09-29 NOTE — Progress Notes (Signed)
Referring Provider: Glenda Chroman, MD Primary Care Physician:  Glenda Chroman, MD Primary GI:  Dr. Gala Romney   Chief Complaint  Patient presents with   Abdominal Pain    RLQ-off/on, burning sensation   Hemorrhoids    not currently having an issue    HPI:   [name] [name] is a 78 y.o. female who presents for follow-up on hemorrhoids.  Patient was last seen in our office 01/13/2018 for GERD, rectal bleeding, hemorrhoids.  Colonoscopy up-to-date 2017 and recommended repeat in 2020 if health permits due to a semi-pedunculated 5 mm sessile polyp found to be serrated polyp/adenoma.  Apothecary cream previously did not help much and prefers sitz bath/warm water with persistent once weekly hemorrhoid bleeding mostly on the toilet tissue.  Grade 3/4 hemorrhoids on last colonoscopy.  She is on anticoagulation and likely not a candidate for hemorrhoid banding.  At her last visit her hemorrhoids were intermittently worse than before and hard to maintain adequate hygiene, noted persistent discomfort.  Bowel movement daily and occasionally skips a day, uses MiraLAX if she is getting constipated which helps.  Still with some straining but no hard stools.  Admits she does not eat enough fiber or drink enough water.  Over-the-counter hemorrhoid cream tends to help.  Intermittent hemorrhoid bleeding every 1 to 2 weeks.  GERD doing well on PPI.  No other GI complaints.  Recommended continue current medications and hemorrhoid cream, limit toilet time to 5 minutes or less, increase fiber and water, follow-up in 6 months.  She did not follow-up as recommended.  Today she states she is doing okay overall.  She had shingles around Easter which was bothersome but has resolved. She is currently due for colonoscopy. She is not having any hemorrhoid problems at this time. Had abdominal pain last fall, put ice on it. Started again 2 weeks ago. Located LLQ, described as burning, intermittent. In the past couple weeks  pain will last 2 days and then let up for a few days. Has never had pain like this before. Denies N/V. No identified triggers, no typical timing. Pain hurts bad enough to sometime makes her cry (about a 6-7/10). Denies hematochezia, melena, fever, chills. Has lost 20 lbs over the past month unintentionally. Appetite is good, but doesn't like cooking. Typically eats 3 meals a day. Has been previously told to try Ensure supplement but noted it has a lot of sodium in it (she's supposed to be on a low salt, low sugar diet.) Denies URI or flu-like symptoms. Denies loss of sense of taste or smell. Denies chest pain, dyspnea, dizziness, lightheadedness, syncope, near syncope. Denies any other upper or lower GI symptoms.  Is on a 2 gm sodium diet.  Past Medical History:  Diagnosis Date   Anxiety    Arthritis    Asthma    Atrial fibrillation, persistent    Chronic diastolic congestive heart failure ([name])    Colon adenoma    Colon cancer ([name])    status post low anterior resection, limited stage disease requiring no adjuvant therapy   COPD (chronic obstructive pulmonary disease) ([name])    Depression    Diverticulosis    DM (dermatomyositis)    DVT (deep venous thrombosis) ([name])    in leg- long time ago   Dyspnea    with activity   Dysrhythmia    Esophageal dysphagia    GERD (gastroesophageal reflux disease)    Headache    Heart murmur    Hematuria  Hemorrhoids    Hiatal hernia    History of kidney stones    x 2   Hypercholesterolemia    Incidental pulmonary nodule 05/08/2016   8 mm vague opacity RML noted on CT scan   Mitral regurgitation    PONV (postoperative nausea and vomiting) 2003 ish    with breast biopsy   S/P minimally invasive maze operation for atrial fibrillation 07/05/2016   Complete bilateral atrial lesion set using cryothermy and bipolar radiofrequency ablation with clipping of LA appendage via right mini thoracotomy approach   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   Complex valvuloplasty including artificial Gore-tex neochord placement x6 and 30 mm Sorin Memo 3D ring annuloplasty via right mini thoracotomy approach   Schatzki's ring    Tricuspid regurgitation     Past Surgical History:  Procedure Laterality Date   APPENDECTOMY     BREAST SURGERY Right 2003ish   biopsy   CARDIAC CATHETERIZATION N/A 04/12/2016   Procedure: Right/Left Heart Cath and Coronary Angiography;  Surgeon: Leonie Man, MD;  Location: [facility];  Service: Cardiovascular;  Laterality: N/A;   CARDIOVERSION N/A 08/16/2017   Procedure: CARDIOVERSION;  Surgeon: Arnoldo Lenis, MD;  Location: [name];  Service: Endoscopy;  Laterality: N/A;   CATARACT EXTRACTION Bilateral 2017   CLIPPING OF ATRIAL APPENDAGE  07/05/2016   Procedure: CLIPPING OF ATRIAL APPENDAGE;  Surgeon: Rexene Alberts, MD;  Location: [facility];  Service: Open Heart Surgery;;   COLONOSCOPY  11/09   Dr. Gala Romney- external hemorrhoidal tags o/w normal rectal mucosa, s/p surgical resection with a normal appearing anastomosis 12cm, pan colonic diverticulum   COLONOSCOPY N/A 06/02/2012   ALP:[id number] post low anterior resection. Pancolonic diverticulosis. Colonic polyp-tubular adenoma. Surveillance due 2019.    COLONOSCOPY N/A 10/13/2015   Procedure: COLONOSCOPY;  Surgeon: Daneil Dolin, MD;  Location: [name];  Service: Endoscopy;  Laterality: N/A;  0930   ESOPHAGOGASTRODUODENOSCOPY  07/2007   Dr. Daiva Nakayama cervical esophageal web, schatzki ring, large hiatal hernia   INGUNAL HERNIA REPAIR Left    LOW ANTERIOR BOWEL RESECTION     NO ADJ CHEMO   MINIMALLY INVASIVE MAZE PROCEDURE N/A 07/05/2016   Procedure: MINIMALLY INVASIVE MAZE PROCEDURE;  Surgeon: Rexene Alberts, MD;  Location: [facility];  Service: Open Heart Surgery;  Laterality: N/A;   MITRAL VALVE REPAIR Right 07/05/2016   Procedure: MINIMALLY INVASIVE MITRAL VALVE REPAIR (MVR);  Surgeon: Rexene Alberts, MD;  Location: [facility];  Service: Open Heart Surgery;  Laterality: Right;   MULTIPLE EXTRACTIONS WITH ALVEOLOPLASTY N/A 05/21/2016   Procedure: MULTIPLE EXTRACTION OF TOOTH #'S 5, 21 WITH ALVEOLOPLASTY AND GROSS DEBRIDEMENT OF TEETH;  Surgeon: Lenn Cal, DDS;  Location: [facility];  Service: Oral Surgery;  Laterality: N/A;   TEE WITHOUT CARDIOVERSION N/A 02/20/2016   Procedure: TRANSESOPHAGEAL ECHOCARDIOGRAM (TEE);  Surgeon: Arnoldo Lenis, MD;  Location: [name];  Service: Endoscopy;  Laterality: N/A;   TEE WITHOUT CARDIOVERSION N/A 07/05/2016   Procedure: TRANSESOPHAGEAL ECHOCARDIOGRAM (TEE);  Surgeon: Rexene Alberts, MD;  Location: [facility];  Service: Open Heart Surgery;  Laterality: N/A;   TOOTH EXTRACTION     TUBAL LIGATION     VEIN LIGATION AND STRIPPING      Current Outpatient Medications  Medication Sig Dispense Refill   acetaminophen (TYLENOL) 500 MG tablet Take 1,000 mg by mouth 2 (two) times daily as needed for headache.     ALPRAZolam (XANAX) 0.5 MG tablet Take  0.5 mg by mouth 2 (two) times daily as needed (for anxiety/sleep (scheduled at bedtime)).      aspirin EC 81 MG EC tablet Take 1 tablet (81 mg total) by mouth daily.     calcium carbonate (OS-CAL) 600 MG TABS tablet Take 600 mg by mouth 2 (two) times daily with a meal.     Coenzyme Q10 100 MG TABS Take 100 mg by mouth every evening.      furosemide (LASIX) 40 MG tablet Take 40 mg by mouth daily.      levothyroxine (SYNTHROID, LEVOTHROID) 100 MCG tablet Take 1 tablet (100 mcg total) by mouth daily. 30 tablet 3   Multiple Vitamin (MULTIVITAMIN) tablet Take 1 tablet by mouth daily.       Polyethyl Glycol-Propyl Glycol (SYSTANE ULTRA) 0.4-0.3 % SOLN Place 1 drop into both eyes daily as needed (dry eyes).      simvastatin (ZOCOR) 40 MG tablet Take 40 mg daily by mouth.     sodium chloride (OCEAN) 0.65 % SOLN nasal spray Place 1 spray into both nostrils as needed for congestion.     warfarin  (COUMADIN) 2.5 MG tablet TAKE 1 OR 1 1/2 TABLETS DAILY OR AS DIRECTED BY COUMADIN CLINIC 120 tablet 2   traMADol (ULTRAM) 50 MG tablet Take 1 tablet (50 mg total) by mouth every 6 (six) hours as needed. (Patient not taking: Reported on 09/29/2018) 15 tablet 0   valACYclovir (VALTREX) 1000 MG tablet Take 1,000 mg by mouth 2 (two) times daily.     No current facility-administered medications for this visit.     Allergies as of 09/29/2018 - Review Complete 09/29/2018  Allergen Reaction Noted   Iohexol Hives and Shortness Of Breath 07/16/2008   Hydrocodone-acetaminophen Hives 04/26/2010   Nabumetone Rash 04/26/2010   Prednisone Rash 10/13/2015    Family History  Problem Relation Age of Onset   Breast cancer Mother    Rheum arthritis Father    Cancer - Lung Sister    Brain cancer Sister    Prostate cancer Brother    Aneurysm Brother    Colon cancer Neg Hx     Social History   Socioeconomic History   Marital status: Married    Spouse name: Not on file   Number of children: 2   Years of education: Not on file   Highest education level: Not on file  Occupational History   Not on file  Social Needs   Financial resource strain: Not on file   Food insecurity:    Worry: Not on file    Inability: Not on file   Transportation needs:    Medical: Not on file    Non-medical: Not on file  Tobacco Use   Smoking status: Never Smoker   Smokeless tobacco: Never Used  Substance and Sexual Activity   Alcohol use: No    Alcohol/week: 0.0 standard drinks   Drug use: No   Sexual activity: Not on file  Lifestyle   Physical activity:    Days per week: Not on file    Minutes per session: Not on file   Stress: Not on file  Relationships   Social connections:    Talks on phone: Not on file    Gets together: Not on file    Attends religious service: Not on file    Active member of club or organization: Not on file    Attends meetings of clubs or organizations:  Not on file    Relationship status:  Not on file  Other Topics Concern   Not on file  Social History Narrative   Not on file    Review of Systems: General: Negative for anorexia, weight loss, fever, chills, fatigue, weakness. ENT: Negative for hoarseness, difficulty swallowing. CV: Negative for chest pain, angina, palpitations, peripheral edema.  Respiratory: Negative for dyspnea at rest, cough, sputum, wheezing.  GI: See history of present illness. Endo: Negative for unusual weight change.  Heme: Negative for bruising or bleeding. Allergy: Negative for rash or hives.   Physical Exam: BP (!) 143/81    Pulse 80    Temp (!) 97.1 F (36.2 C) (Oral)    Ht 5\' 1"  (1.549 m)    Wt 120 lb 9.6 oz (54.7 kg)    BMI 22.79 kg/m  General:   Alert and oriented. Pleasant and cooperative. Well-nourished and well-developed.  Eyes:  Without icterus, sclera clear and conjunctiva pink.  Ears:  Normal auditory acuity. Cardiovascular:  S1, S2 present without murmurs appreciated. Extremities without clubbing or edema. Respiratory:  Clear to auscultation bilaterally. No wheezes, rales, or rhonchi. No distress.  Gastrointestinal:  +BS, soft, non-tender and non-distended. No HSM noted. No guarding or rebound. No masses appreciated.  Rectal:  Deferred  Musculoskalatal:  Symmetrical without gross deformities. Neurologic:  Alert and oriented x4;  grossly normal neurologically. Psych:  Alert and cooperative. Normal mood and affect. Heme/Lymph/Immune: No excessive bruising noted.    09/29/2018 12:30 PM   Disclaimer: This note was dictated with voice recognition software. Similar sounding words can inadvertently be transcribed and may not be corrected upon review.

## 2018-09-29 NOTE — Patient Instructions (Signed)
Your health issues we discussed today were:   Abdominal pain and weight loss: 1. I will put in an order for CT of your abdomen today 2. We will schedule colonoscopy for you 3. Further recommendations to follow 4. Try drinking Ensure for a similar supplement product 1-2 times a day to help with weight loss 5. Call if you have any severe worsening symptoms  Overall I recommend:  1. Continue your other current medications 2. Return for follow-up in 4 to 6 weeks 3. Call us if you have any questions or concerns.   Because of recent events of COVID-19 ("Coronavirus"), follow CDC recommendations:  Wash your hand frequently Avoid touching your face Stay away from people who are sick If you have symptoms such as fever, cough, shortness of breath then call your healthcare provider for further guidance If you are sick, STAY AT HOME unless otherwise directed by your healthcare provider. Follow directions from state and national officials regarding staying safe   At [facility] we value your feedback. You may receive a survey about your visit today. Please share your experience as we strive to create trusting relationships with our patients to provide genuine, compassionate, quality care.  We appreciate your understanding and patience as we review any laboratory studies, imaging, and other diagnostic tests that are ordered as we care for you. Our office policy is 5 business days for review of these results, and any emergent or urgent results are addressed in a timely manner for your best interest. If you do not hear from our office in 1 week, please contact us.   We also encourage the use of MyChart, which contains your medical information for your review as well. If you are not enrolled in this feature, an access code is on this after visit summary for your convenience. Thank you for allowing [name] to be involved in your care.  It was great to see you today!  I hope you have a great  summer!!

## 2018-09-30 ENCOUNTER — Other Ambulatory Visit: Payer: Self-pay | Admitting: *Deleted

## 2018-09-30 DIAGNOSIS — K649 Unspecified hemorrhoids: Secondary | ICD-10-CM

## 2018-09-30 DIAGNOSIS — R634 Abnormal weight loss: Secondary | ICD-10-CM

## 2018-09-30 DIAGNOSIS — R1030 Lower abdominal pain, unspecified: Secondary | ICD-10-CM

## 2018-09-30 MED ORDER — PEG 3350-KCL-NA BICARB-NACL 420 G PO SOLR: 4000.0000 mL | Freq: Once | ORAL | 0 refills | Status: AC

## 2018-09-30 NOTE — Telephone Encounter (Signed)
Called patient and she is scheduled for TCS with propofol with RMR on 7/30 at 2:30pm. Patient aware will need pre-op appt and I will mail this with her prep instructions. Confirmed mailing address. Rx sent to pharmacy. Orders entered.

## 2018-09-30 NOTE — Progress Notes (Signed)
Please let patient know stat CT reviewed, ordered by EG. No diverticulitis. Minimally prominent stool throughout colon. She has a large hiatal hernia with questionable wall thickening of stomach. Would benefit from EGD at time of colonoscopy for diagnostic purposes. Eric to provide final recommendations when he returns tomorrow.

## 2018-10-01 ENCOUNTER — Other Ambulatory Visit: Payer: Self-pay | Admitting: "Endocrinology

## 2018-10-06 ENCOUNTER — Telehealth: Payer: Self-pay | Admitting: Internal Medicine

## 2018-10-06 NOTE — Telephone Encounter (Signed)

## 2018-10-07 ENCOUNTER — Other Ambulatory Visit: Payer: Self-pay

## 2018-10-07 ENCOUNTER — Ambulatory Visit (INDEPENDENT_AMBULATORY_CARE_PROVIDER_SITE_OTHER): Payer: Medicare Other | Admitting: "Endocrinology

## 2018-10-07 ENCOUNTER — Encounter: Payer: Self-pay | Admitting: "Endocrinology

## 2018-10-07 VITALS — BP 152/76 | HR 83 | Ht 61.0 in | Wt 121.6 lb

## 2018-10-07 DIAGNOSIS — E032 Hypothyroidism due to medicaments and other exogenous substances: Secondary | ICD-10-CM | POA: Diagnosis not present

## 2018-10-07 MED ORDER — LEVOTHYROXINE SODIUM 88 MCG PO TABS: 88.0000 ug | ORAL_TABLET | Freq: Every day | ORAL | 3 refills | Status: DC

## 2018-10-07 NOTE — Progress Notes (Signed)
Endocrinology follow up Note                                            10/07/2018, 4:39 PM   Subjective:    Patient ID: [name] [name], female    DOB: [birth year], PCP Glenda Chroman, MD   Past Medical History:  Diagnosis Date  . Anxiety   . Arthritis   . Asthma   . Atrial fibrillation, persistent   . Chronic diastolic congestive heart failure ([place])   . Colon adenoma   . Colon cancer ([place])    status post low anterior resection, limited stage disease requiring no adjuvant therapy  . COPD (chronic obstructive pulmonary disease) ([place])   . Depression   . Diverticulosis   . DM (dermatomyositis)   . DVT (deep venous thrombosis) ([name])    in leg- long time ago  . Dyspnea    with activity  . Dysrhythmia   . Esophageal dysphagia   . GERD (gastroesophageal reflux disease)   . Headache   . Heart murmur   . Hematuria   . Hemorrhoids   . Hiatal hernia   . History of kidney stones    x 2  . Hypercholesterolemia   . Incidental pulmonary nodule 05/08/2016   8 mm vague opacity RML noted on CT scan  . Mitral regurgitation   . PONV (postoperative nausea and vomiting) 2003 ish    with breast biopsy  . S/P minimally invasive maze operation for atrial fibrillation 07/05/2016   Complete bilateral atrial lesion set using cryothermy and bipolar radiofrequency ablation with clipping of LA appendage via right mini thoracotomy approach  . 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 Schatzki's ring   . Tricuspid regurgitation    Past Surgical History:  Procedure Laterality Date  . APPENDECTOMY    . BREAST SURGERY Right 2003ish   biopsy  . CARDIAC CATHETERIZATION N/A 04/12/2016   Procedure: Right/Left Heart Cath and Coronary Angiography;  Surgeon: Leonie Man, MD;  Location: [facility] [facility];  Service:  Cardiovascular;  Laterality: N/A;  . CARDIOVERSION N/A 08/16/2017   Procedure: CARDIOVERSION;  Surgeon: Arnoldo Lenis, MD;  [name];  Service: Endoscopy;  Laterality: N/A;  . CATARACT EXTRACTION Bilateral 2017  . CLIPPING OF ATRIAL APPENDAGE  07/05/2016   Procedure: CLIPPING OF ATRIAL APPENDAGE;  Surgeon: Rexene Alberts, MD;  Location: [facility];  Service: Open Heart Surgery;;  . COLONOSCOPY  11/09   Dr. Gala Romney- external hemorrhoidal tags o/w normal rectal mucosa, s/p surgical resection with a normal appearing anastomosis 12cm, pan colonic diverticulum  . COLONOSCOPY N/A 06/02/2012   WUJ:[id number] post low anterior resection. Pancolonic diverticulosis. Colonic polyp-tubular adenoma. Surveillance due 2019.   Marland Kitchen COLONOSCOPY N/A 10/13/2015   Procedure: COLONOSCOPY;  Surgeon: Daneil Dolin, MD;  [name];  Service: Endoscopy;  Laterality: N/A;  0930  . ESOPHAGOGASTRODUODENOSCOPY  07/2007   Dr. Daiva Nakayama cervical esophageal web, schatzki ring, large hiatal hernia  . INGUNAL HERNIA REPAIR Left   . LOW ANTERIOR BOWEL RESECTION     NO ADJ CHEMO  . MINIMALLY INVASIVE MAZE PROCEDURE N/A 07/05/2016   Procedure: MINIMALLY INVASIVE MAZE PROCEDURE;  Surgeon: Rexene Alberts,  MD;  [facility];  Service: Open Heart Surgery;  Laterality: N/A;  . MITRAL VALVE REPAIR Right 07/05/2016   Procedure: MINIMALLY INVASIVE MITRAL VALVE REPAIR (MVR);  Surgeon: Rexene Alberts, MD;  Location: [facility];  Service: Open Heart Surgery;  Laterality: Right;  . MULTIPLE EXTRACTIONS WITH ALVEOLOPLASTY N/A 05/21/2016   Procedure: MULTIPLE EXTRACTION OF TOOTH #'S 5, 21 WITH ALVEOLOPLASTY AND GROSS DEBRIDEMENT OF TEETH;  Surgeon: Lenn Cal, DDS;  Location: [facility];  Service: Oral Surgery;  Laterality: N/A;  . TEE WITHOUT CARDIOVERSION N/A 02/20/2016   Procedure: TRANSESOPHAGEAL ECHOCARDIOGRAM (TEE);  Surgeon: Arnoldo Lenis, MD;  [name];  Service: Endoscopy;  Laterality: N/A;  . TEE  WITHOUT CARDIOVERSION N/A 07/05/2016   Procedure: TRANSESOPHAGEAL ECHOCARDIOGRAM (TEE);  Surgeon: Rexene Alberts, MD;  Location: [facility];  Service: Open Heart Surgery;  Laterality: N/A;  . TOOTH EXTRACTION    . TUBAL LIGATION    . VEIN LIGATION AND STRIPPING     Social History   Socioeconomic History  . Marital status: Married    Spouse name: Not on file  . Number of children: 2  . Years of education: Not on file  . Highest education level: Not on file  Occupational History  . Not on file  Social Needs  . Financial resource strain: Not on file  . Food insecurity    Worry: Not on file    Inability: Not on file  . Transportation needs    Medical: Not on file    Non-medical: Not on file  Tobacco Use  . Smoking status: Never Smoker  . Smokeless tobacco: Never Used  Substance and Sexual Activity  . Alcohol use: No    Alcohol/week: 0.0 standard drinks  . Drug use: No  . Sexual activity: Not on file  Lifestyle  . Physical activity    Days per week: Not on file    Minutes per session: Not on file  . Stress: Not on file  Relationships  . Social Herbalist on phone: Not on file    Gets together: Not on file    Attends religious service: Not on file    Active member of club or organization: Not on file    Attends meetings of clubs or organizations: Not on file    Relationship status: Not on file  Other Topics Concern  . Not on file  Social History Narrative  . Not on file   Outpatient Encounter Medications as of 10/07/2018  Medication Sig  . acetaminophen (TYLENOL) 500 MG tablet Take 1,000 mg by mouth 2 (two) times daily as needed for headache.  . ALPRAZolam (XANAX) 0.5 MG tablet Take 0.5 mg by mouth 2 (two) times daily as needed (for anxiety/sleep (scheduled at bedtime)).   Marland Kitchen aspirin EC 81 MG EC tablet Take 1 tablet (81 mg total) by mouth daily.  . calcium carbonate (OS-CAL) 600 MG TABS tablet Take 600 mg by mouth 2 (two) times daily with a meal.  . Coenzyme Q10  100 MG TABS Take 100 mg by mouth every evening.   . furosemide (LASIX) 40 MG tablet Take 40 mg by mouth daily.   Marland Kitchen levothyroxine (SYNTHROID) 88 MCG tablet Take 1 tablet (88 mcg total) by mouth daily before breakfast.  . Multiple Vitamin (MULTIVITAMIN) tablet Take 1 tablet by mouth daily.    Vladimir Faster Glycol-Propyl Glycol (SYSTANE ULTRA) 0.4-0.3 % SOLN Place 1 drop into both eyes daily as needed (dry  eyes).   . simvastatin (ZOCOR) 40 MG tablet Take 40 mg daily by mouth.  . sodium chloride (OCEAN) 0.65 % SOLN nasal spray Place 1 spray into both nostrils as needed for congestion.  Marland Kitchen warfarin (COUMADIN) 2.5 MG tablet TAKE 1 OR 1 1/2 TABLETS DAILY OR AS DIRECTED BY COUMADIN CLINIC  . [DISCONTINUED] levothyroxine (SYNTHROID) 100 MCG tablet TAKE 1 TABLET BY MOUTH EVERY DAY  . [DISCONTINUED] traMADol (ULTRAM) 50 MG tablet Take 1 tablet (50 mg total) by mouth every 6 (six) hours as needed. (Patient not taking: Reported on 09/29/2018)  . [DISCONTINUED] valACYclovir (VALTREX) 1000 MG tablet Take 1,000 mg by mouth 2 (two) times daily.   No facility-administered encounter medications on file as of 10/07/2018.    ALLERGIES: Allergies  Allergen Reactions  . Iohexol Hives and Shortness Of Breath     Pt. had a severe allergic reaction to IV contrast the last time she was injected and had to be seen in the ER.   Marland Kitchen Hydrocodone-Acetaminophen Hives  . Nabumetone Rash  . Prednisone Rash    VACCINATION STATUS:  There is no immunization history on file for this patient.  HPI LULLA LINVILLE is 78 y.o. female who presents today with repeat thyroid function tests for follow-up.  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  She remains on levothyroxine 100 mcg p.o. every morning.    She presents with stable body weight, denies palpitations, tremors, heat/cold intolerance.     - She is on multiple medications including anticoagulation with warfarin related to her cardiac surgery for tricuspid regurgitation as well as atrial fibrillation. -Her current medication list does not include active amiodarone, however she has not seen her cardiologist for more than 6 months.      Review of Systems  Constitutional: + steady Weight , +anxiety, + fatigue, no subjective hyperthermia, no subjective hypothermia Eyes: no blurry vision, no xerophthalmia ENT: no sore throat, no nodules palpated in throat, no dysphagia/odynophagia, no hoarseness Musculoskeletal: no muscle/joint aches Skin: no rashes Neurological: no tremors, no numbness, no tingling, no dizziness Psychiatric: no depression, no anxiety  Objective:    BP (!) 152/76   Pulse 83   Ht 5\' 1"  (1.549 m)   Wt 121 lb 9.6 oz (55.2 kg)   SpO2 97%   BMI 22.98 kg/m   Wt Readings from Last 3 Encounters:  10/07/18 121 lb 9.6 oz (55.2 kg)  09/29/18 120 lb 9.6 oz (54.7 kg)  09/09/18 120 lb (54.4 kg)    Physical Exam   Constitutional:  not in acute distress, normal state of mind Eyes:  EOMI, no exophthalmos Neck: Supple Respiratory: Adequate breathing efforts Musculoskeletal: no gross deformities, strength intact in all four extremities Skin:  no rashes, no hyperemia Neurological: no tremor with outstretched hands.  CMP     Component Value Date/Time   NA 144 08/12/2017 1409   K 3.8 08/12/2017 1409   CL 100 (L) 08/12/2017 1409   CO2 27 08/12/2017 1409   GLUCOSE 96 08/12/2017 1409   BUN 15 08/12/2017 1409   CREATININE 1.01 (H) 08/12/2017 1409   CALCIUM 9.2 08/12/2017 1409   PROT 6.7 07/03/2016 1157   ALBUMIN 3.8 07/03/2016 1157   AST 23 07/03/2016 1157   ALT 19 07/03/2016 1157   ALKPHOS 113 07/03/2016 1157   BILITOT 0.5 07/03/2016 1157   GFRNONAA 52 (L) 08/12/2017 1409   GFRAA >60  08/12/2017 1409    Diabetic Labs (most recent): Lab Results  Component Value Date   HGBA1C 5.6 07/03/2016    Lab  Results  Component Value Date   TSH 0.084 (L) 09/29/2018   TSH 0.311 (L) 06/30/2018   TSH 2.700 03/13/2017   TSH 3.327 07/27/2016   FREET4 1.27 09/29/2018   FREET4 1.23 06/30/2018    Recent Results (from the past 2160 hour(s))  POCT INR     Status: Abnormal   Collection Time: 07/10/18  9:51 AM  Result Value Ref Range   INR 1.8 (A) 2.0 - 3.0  POCT INR     Status: Normal   Collection Time: 07/17/18  3:07 PM  Result Value Ref Range   INR 2.8 2.0 - 3.0  POCT INR     Status: Normal   Collection Time: 07/24/18  1:05 PM  Result Value Ref Range   INR 2.9 2.0 - 3.0  POCT INR     Status: Normal   Collection Time: 08/14/18 10:29 AM  Result Value Ref Range   INR 2.6 2.0 - 3.0  POCT INR     Status: Abnormal   Collection Time: 09/01/18 11:42 AM  Result Value Ref Range   INR 1.6 (A) 2.0 - 3.0  POCT INR     Status: Abnormal   Collection Time: 09/22/18 10:43 AM  Result Value Ref Range   INR 1.8 (A) 2.0 - 3.0  TSH     Status: Abnormal   Collection Time: 09/29/18  1:45 PM  Result Value Ref Range   TSH 0.084 (L) 0.350 - 4.500 uIU/mL    Comment: Performed by a 3rd Generation assay with a functional sensitivity of <=0.01 uIU/mL. Performed at [facility], [address] [place], [place] [address]   T4, free     Status: None   Collection Time: 09/29/18  1:45 PM  Result Value Ref Range   Free T4 1.27 0.82 - 1.77 ng/dL    Comment: (NOTE) Biotin ingestion may interfere with free T4 tests. If the results are inconsistent with the TSH level, previous test results, or the clinical presentation, then consider biotin interference. If needed, order repeat testing after stopping biotin. Performed at [facility], [place] [address], [place], [place] [address]       Assessment & Plan:   1.  Hypothyroidism likely related to amiodarone treatment Her previsit thyroid function tests are consistent with slight over replacement.  She is approached for lower dose of levothyroxine.  I discussed  and lowered her levothyroxine to 88 mcg p.o. every morning.   - We discussed about the correct intake of her thyroid hormone, on empty stomach at fasting, with water, separated by at least 30 minutes from breakfast and other medications,  and separated by more than 4 hours from calcium, iron, multivitamins, acid reflux medications (PPIs). -Patient is made aware of the fact that thyroid hormone replacement is needed for life, dose to be adjusted by periodic monitoring of thyroid function tests. - I advised her  to maintain close follow up with Glenda Chroman, MD for primary care needs.  I encouraged her to resume her follow-up with her cardiologist regarding her history of atrial fibrillation which required treatment with amiodarone.  Time for this visit 15 minutes.  Brittan Butterbaugh Henner participated in the discussions, expressed understanding, and voiced agreement with the above plans.  All questions were answered to her satisfaction. she is encouraged to contact clinic should she have any questions  or concerns prior to her return visit.   Follow up plan: Return in about 4 months (around 02/06/2019) for Follow up with Pre-visit Labs.   Glade Lloyd, MD [facility] [address] [place], [place] [address] Phone: [phone]  Fax: [phone]     10/07/2018, 4:39 PM  This note was partially dictated with voice recognition software. Similar sounding words can be transcribed inadequately or may not  be corrected upon review.

## 2018-10-08 ENCOUNTER — Telehealth (INDEPENDENT_AMBULATORY_CARE_PROVIDER_SITE_OTHER): Payer: Medicare Other | Admitting: Internal Medicine

## 2018-10-08 VITALS — BP 130/72 | HR 84 | Ht 61.0 in | Wt 121.0 lb

## 2018-10-08 DIAGNOSIS — I4819 Other persistent atrial fibrillation: Secondary | ICD-10-CM

## 2018-10-08 DIAGNOSIS — I495 Sick sinus syndrome: Secondary | ICD-10-CM

## 2018-10-08 NOTE — Patient Instructions (Signed)
Medication Instructions:  Your physician recommends that you continue on your current medications as directed. Please refer to the Current Medication list given to you today.  If you need a refill on your cardiac medications before your next appointment, please call your pharmacy.   Lab work: NONE  If you have labs (blood work) drawn today and your tests are completely normal, you will receive your results only by: Marland Kitchen MyChart Message (if you have MyChart) OR . A paper copy in the mail If you have any lab test that is abnormal or we need to change your treatment, we will call you to review the results.  Testing/Procedures: NONE   Follow-Up: At [facility], you and your health needs are our priority.  As part of our continuing mission to provide you with exceptional heart care, we have created designated Provider Care Teams.  These Care Teams include your primary Cardiologist (physician) and Advanced Practice Providers (APPs -  Physician Assistants and Nurse Practitioners) who all work together to provide you with the care you need, when you need it. You will need a follow up appointment as needed.  Please call our office 2 months in advance to schedule this appointment.  You may see Cristopher Peru, MD or one of the following Advanced Practice Providers on your designated Care Team:   Chanetta Marshall, NP . Tommye Standard, PA-C  Any Other Special Instructions Will Be Listed Below (If Applicable). Thank you for choosing [facility]!

## 2018-10-08 NOTE — Progress Notes (Signed)
Electrophysiology TeleHealth Note   Due to national recommendations of social distancing due to COVID 19, an audio/video telehealth visit is felt to be most appropriate for this patient at this time.  See MyChart message from today for the patient's consent to telehealth for [facility].   Date:  10/08/2018   ID:  [name] [name], DOB Jul 20, 1940, MRN [id number]  Location: patient's home  Provider location: [address] [place]  Evaluation Performed: Follow-up visit  PCP:  Glenda Chroman, MD  Cardiologist:  Carlyle Dolly, MD  Electrophysiologist:  Dr Lovena Le  Chief Complaint:  "I am skin and bones."  History of Present Illness:    [name] [name] is a 78 y.o. female who presents via audio/video conferencing for a telehealth visit today.  She is a pleasant woman s/p Mitral valve repair, PAF, HTN, and sinus node dysfunction. In the interim she notes that her leg swelling on the left remains the same. She has lost 20 lbs in the last 6 months. She developed sinus bradycardia on amiodarone. I had initially recommended that she reduce her dose down to 1 gram a week. At some point it was stopped and I have reviewed her chart 20 minutes and cannot figure out why. She notes that she feels some palpitations but that her HR is in the 80-100 range. No syncope.  The patient denies symptoms of fevers, chills, cough, or new SOB worrisome for COVID 19.  Past Medical History:  Diagnosis Date  . Anxiety   . Arthritis   . Asthma   . Atrial fibrillation, persistent   . Chronic diastolic congestive heart failure ([place])   . Colon adenoma   . Colon cancer ([place])    status post low anterior resection, limited stage disease requiring no adjuvant therapy  . COPD (chronic obstructive pulmonary disease) ([place])   . Depression   . Diverticulosis   . DM (dermatomyositis)   . DVT (deep venous thrombosis) ([name])    in leg- long time ago  . Dyspnea    with activity  . Dysrhythmia    . Esophageal dysphagia   . GERD (gastroesophageal reflux disease)   . Headache   . Heart murmur   . Hematuria   . Hemorrhoids   . Hiatal hernia   . History of kidney stones    x 2  . Hypercholesterolemia   . Incidental pulmonary nodule 05/08/2016   8 mm vague opacity RML noted on CT scan  . Mitral regurgitation   . PONV (postoperative nausea and vomiting) 2003 ish    with breast biopsy  . S/P minimally invasive maze operation for atrial fibrillation 07/05/2016   Complete bilateral atrial lesion set using cryothermy and bipolar radiofrequency ablation with clipping of LA appendage via right mini thoracotomy approach  . 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 Schatzki's ring   . Tricuspid regurgitation     Past Surgical History:  Procedure Laterality Date  . APPENDECTOMY    . BREAST SURGERY Right 2003ish   biopsy  . CARDIAC CATHETERIZATION N/A 04/12/2016   Procedure: Right/Left Heart Cath and Coronary Angiography;  Surgeon: Leonie Man, MD;  Location: [place] [facility];  Service: Cardiovascular;  Laterality: N/A;  . CARDIOVERSION N/A 08/16/2017   Procedure: CARDIOVERSION;  Surgeon: Arnoldo Lenis, MD;  [name];  Service: Endoscopy;  Laterality: N/A;  . CATARACT EXTRACTION Bilateral 2017  . CLIPPING OF ATRIAL APPENDAGE  07/05/2016   Procedure: CLIPPING OF ATRIAL APPENDAGE;  Surgeon: Rexene Alberts, MD;  Location: [place];  Service: Open Heart Surgery;;  . COLONOSCOPY  11/09   Dr. Gala Romney- external hemorrhoidal tags o/w normal rectal mucosa, s/p surgical resection with a normal appearing anastomosis 12cm, pan colonic diverticulum  . COLONOSCOPY N/A 06/02/2012   PZW:CHENID post low anterior resection. Pancolonic diverticulosis. Colonic polyp-tubular adenoma. Surveillance due 2019.   Marland Kitchen COLONOSCOPY N/A  10/13/2015   Procedure: COLONOSCOPY;  Surgeon: Daneil Dolin, MD;  [name];  Service: Endoscopy;  Laterality: N/A;  0930  . ESOPHAGOGASTRODUODENOSCOPY  07/2007   Dr. Daiva Nakayama cervical esophageal web, schatzki ring, large hiatal hernia  . INGUNAL HERNIA REPAIR Left   . LOW ANTERIOR BOWEL RESECTION     NO ADJ CHEMO  . MINIMALLY INVASIVE MAZE PROCEDURE N/A 07/05/2016   Procedure: MINIMALLY INVASIVE MAZE PROCEDURE;  Surgeon: Rexene Alberts, MD;  Location: [place];  Service: Open Heart Surgery;  Laterality: N/A;  . MITRAL VALVE REPAIR Right 07/05/2016   Procedure: MINIMALLY INVASIVE MITRAL VALVE REPAIR (MVR);  Surgeon: Rexene Alberts, MD;  Location: [place];  Service: Open Heart Surgery;  Laterality: Right;  . MULTIPLE EXTRACTIONS WITH ALVEOLOPLASTY N/A 05/21/2016   Procedure: MULTIPLE EXTRACTION OF TOOTH #'S 5, 21 WITH ALVEOLOPLASTY AND GROSS DEBRIDEMENT OF TEETH;  Surgeon: Lenn Cal, DDS;  Location: [place];  Service: Oral Surgery;  Laterality: N/A;  . TEE WITHOUT CARDIOVERSION N/A 02/20/2016   Procedure: TRANSESOPHAGEAL ECHOCARDIOGRAM (TEE);  Surgeon: Arnoldo Lenis, MD;  [name];  Service: Endoscopy;  Laterality: N/A;  . TEE WITHOUT CARDIOVERSION N/A 07/05/2016   Procedure: TRANSESOPHAGEAL ECHOCARDIOGRAM (TEE);  Surgeon: Rexene Alberts, MD;  Location: [facility];  Service: Open Heart Surgery;  Laterality: N/A;  . TOOTH EXTRACTION    . TUBAL LIGATION    . VEIN LIGATION AND STRIPPING      Current Outpatient Medications  Medication Sig Dispense Refill  . acetaminophen (TYLENOL) 500 MG tablet Take 1,000 mg by mouth 2 (two) times daily as needed for headache.    . ALPRAZolam (XANAX) 0.5 MG tablet Take 0.5 mg by mouth 2 (two) times daily as needed (for anxiety/sleep (scheduled at bedtime)).     Marland Kitchen aspirin EC 81 MG EC tablet Take 1 tablet (81 mg total) by mouth daily.    . calcium carbonate (OS-CAL) 600 MG TABS tablet Take 600 mg by mouth 2 (two) times daily with  a meal.    . Coenzyme Q10 100 MG TABS Take 100 mg by mouth every evening.     . furosemide (LASIX) 40 MG tablet Take 40 mg by mouth daily.     Marland Kitchen levothyroxine (SYNTHROID) 88 MCG tablet Take 1 tablet (88 mcg total) by mouth daily before breakfast. 30 tablet 3  . Multiple Vitamin (MULTIVITAMIN) tablet Take 1 tablet by mouth daily.      Vladimir Faster Glycol-Propyl Glycol (SYSTANE ULTRA) 0.4-0.3 % SOLN Place 1 drop into both eyes daily as needed (dry eyes).     . simvastatin (ZOCOR) 40 MG tablet Take 40 mg daily by mouth.    . sodium chloride (OCEAN) 0.65 % SOLN nasal spray Place 1 spray into both nostrils as needed for congestion.    Marland Kitchen warfarin (COUMADIN) 2.5 MG tablet TAKE 1 OR 1 1/2 TABLETS DAILY OR AS DIRECTED BY COUMADIN CLINIC 120  tablet 2   No current facility-administered medications for this visit.     Allergies:   Iohexol, Hydrocodone-acetaminophen, Nabumetone, and Prednisone   Social History:  The patient  reports that she has never smoked. She has never used smokeless tobacco. She reports that she does not drink alcohol or use drugs.   Family History:  The patient's  family history includes Aneurysm in her brother; Brain cancer in her sister; Breast cancer in her mother; Cancer - Lung in her sister; Prostate cancer in her brother; Rheum arthritis in her father.   ROS:  Please see the history of present illness.   All other systems are personally reviewed and negative.    Exam:    Vital Signs:  BP 130/72   Pulse 84   Ht 5\' 1"  (1.549 m)   Wt 121 lb (54.9 kg)   BMI 22.86 kg/m   Well appearing, alert and conversant, regular work of breathing,  good skin color Eyes- anicteric, neuro- grossly intact, skin- no apparent rash or lesions or cyanosis, mouth- oral mucosa is pink   Labs/Other Tests and Data Reviewed:    Recent Labs: 09/29/2018: TSH 0.084   Wt Readings from Last 3 Encounters:  10/08/18 121 lb (54.9 kg)  10/07/18 121 lb 9.6 oz (55.2 kg)  09/29/18 120 lb 9.6 oz (54.7  kg)     Other studies personally reviewed:   ASSESSMENT & PLAN:    1.  Atrial fib - I suspect she is back in atrial fib. With her comorbidities, and with her VR seemingly well controlled, it is reasonable to hold off on AA drug therapy and continue a strategy of rate control alone.  2. Sinus node dysfunction - she was in the 40's on amiodarone. Hopefully off of amio she can avoid PPM. 3. COVID 19 screen The patient denies symptoms of COVID 19 at this time.  The importance of social distancing was discussed today.  Follow-up:  Prn. She will see Dr. [name] and I will be available as needed Next remote: n/a  Current medicines are reviewed at length with the patient today.   The patient does not have concerns regarding her medicines.  The following changes were made today:  none  Labs/ tests ordered today include: none No orders of the defined types were placed in this encounter.    Patient Risk:  after full review of this patients clinical status, I feel that they are at moderate risk at this time.  Today, I have spent 15 minutes with the patient with telehealth technology discussing all of the above .    Signed, Cristopher Peru, MD  10/08/2018 11:01 AM     [facility] [address] [place] [address] [phone] (office) [phone] (fax)

## 2018-10-16 ENCOUNTER — Ambulatory Visit (INDEPENDENT_AMBULATORY_CARE_PROVIDER_SITE_OTHER): Payer: Medicare Other | Admitting: *Deleted

## 2018-10-16 DIAGNOSIS — Z5181 Encounter for therapeutic drug level monitoring: Secondary | ICD-10-CM | POA: Diagnosis not present

## 2018-10-16 DIAGNOSIS — I4819 Other persistent atrial fibrillation: Secondary | ICD-10-CM

## 2018-10-16 DIAGNOSIS — Z9889 Other specified postprocedural states: Secondary | ICD-10-CM | POA: Diagnosis not present

## 2018-10-16 LAB — POCT INR: INR: 1.5 — AB (ref 2.0–3.0)

## 2018-10-16 NOTE — Patient Instructions (Signed)
Take 3 tablets tonight then increase dose to 2 tablets daily except 3 tablets on Tuesdays and Fridays Recheck in 3 weeks

## 2018-10-17 DIAGNOSIS — E7800 Pure hypercholesterolemia, unspecified: Secondary | ICD-10-CM | POA: Diagnosis not present

## 2018-10-17 DIAGNOSIS — J449 Chronic obstructive pulmonary disease, unspecified: Secondary | ICD-10-CM | POA: Diagnosis not present

## 2018-10-17 DIAGNOSIS — I4891 Unspecified atrial fibrillation: Secondary | ICD-10-CM | POA: Diagnosis not present

## 2018-10-17 DIAGNOSIS — M159 Polyosteoarthritis, unspecified: Secondary | ICD-10-CM | POA: Diagnosis not present

## 2018-10-20 ENCOUNTER — Other Ambulatory Visit: Payer: Self-pay | Admitting: *Deleted

## 2018-10-20 DIAGNOSIS — Z86010 Personal history of colonic polyps: Secondary | ICD-10-CM

## 2018-10-20 DIAGNOSIS — K3189 Other diseases of stomach and duodenum: Secondary | ICD-10-CM

## 2018-10-20 DIAGNOSIS — R634 Abnormal weight loss: Secondary | ICD-10-CM

## 2018-10-20 DIAGNOSIS — K649 Unspecified hemorrhoids: Secondary | ICD-10-CM

## 2018-10-20 DIAGNOSIS — R1030 Lower abdominal pain, unspecified: Secondary | ICD-10-CM

## 2018-10-29 DIAGNOSIS — E7800 Pure hypercholesterolemia, unspecified: Secondary | ICD-10-CM | POA: Diagnosis not present

## 2018-10-29 DIAGNOSIS — M159 Polyosteoarthritis, unspecified: Secondary | ICD-10-CM | POA: Diagnosis not present

## 2018-10-29 DIAGNOSIS — I4891 Unspecified atrial fibrillation: Secondary | ICD-10-CM | POA: Diagnosis not present

## 2018-10-29 DIAGNOSIS — J449 Chronic obstructive pulmonary disease, unspecified: Secondary | ICD-10-CM | POA: Diagnosis not present

## 2018-10-31 ENCOUNTER — Ambulatory Visit (INDEPENDENT_AMBULATORY_CARE_PROVIDER_SITE_OTHER): Payer: Medicare Other | Admitting: Nurse Practitioner

## 2018-10-31 ENCOUNTER — Other Ambulatory Visit: Payer: Self-pay

## 2018-10-31 ENCOUNTER — Encounter: Payer: Self-pay | Admitting: Nurse Practitioner

## 2018-10-31 VITALS — BP 147/81 | HR 82 | Temp 97.8°F | Ht 61.0 in | Wt 123.4 lb

## 2018-10-31 DIAGNOSIS — R935 Abnormal findings on diagnostic imaging of other abdominal regions, including retroperitoneum: Secondary | ICD-10-CM | POA: Insufficient documentation

## 2018-10-31 DIAGNOSIS — K649 Unspecified hemorrhoids: Secondary | ICD-10-CM | POA: Diagnosis not present

## 2018-10-31 DIAGNOSIS — R634 Abnormal weight loss: Secondary | ICD-10-CM | POA: Diagnosis not present

## 2018-10-31 DIAGNOSIS — R1030 Lower abdominal pain, unspecified: Secondary | ICD-10-CM | POA: Diagnosis not present

## 2018-10-31 NOTE — Assessment & Plan Note (Signed)
Previously noted weight loss of 20 pounds since February 2020.  In the past month since her last visit objectively she is put on 3 pounds.  Previously recommended Ensure, boost, or other nutritional supplement that meets her 2 g sodium diet stipulation to help her maintain adequate nutrition and weight.  It appears she is doing well from this perspective.  She is scheduled for colonoscopy and upper endoscopy at the end of this month.  Continue current medications, further recommendations to follow procedures, follow-up in 3 months.

## 2018-10-31 NOTE — Progress Notes (Signed)
Referring Provider: Glenda Chroman, MD Primary Care Physician:  Glenda Chroman, MD Primary GI:  Dr. Gala Romney  Chief Complaint  Patient presents with  . Hemorrhoids    doing well today  . Abdominal Pain    pain qd this week kt to mid abd    HPI:   [name] [name] is a 78 y.o. female who presents for hemorrhoids and abdominal pain.  The patient was last seen in our office 09/29/2018 for hemorrhoids, lower abdominal pain, right lower quadrant abdominal pain, weight loss.  Due for repeat colonoscopy in 2020 which appears already scheduled for the end of this month.  History of colon polyps.  Noted grade 3/4 hemorrhoids on last colonoscopy with significant symptoms; previously tried apothecary cream and sitz bath with persistent once weekly hemorrhoid bleeding mostly scant toilet tissue hematochezia.  Difficulty in maintaining adequate hygiene.  At her last visit noted recent shingles.  No hemorrhoid problems at that time, recurrent abdominal pain 2 weeks ago of left lower quadrant and lower abdomen described as burning and intermittent which will typically last for 2 days and then let up for a couple days.  Has never had pain like this before, no nausea vomiting, no identified triggers.  Occasionally her pain is severe 6-7 out of 10.  Has lost 20 pounds in the past month despite good appetite, typically consumes 3 meals a day.  She is on a 2 g sodium diet for history of chronic diastolic congestive heart failure.  Recommended CT of the abdomen, schedule colonoscopy, Ensure or similar low-sodium product 1-2 times a day, follow-up in 4 to 6 weeks.  CT completed noted minimally prominent stool throughout the colon, large hiatal hernia with questionable wall thickening of the stomach that cannot exclude gastritis or infiltrative process and recommended upper endoscopy, probable tiny bilateral inguinal hernias containing fat.  Due to her CT findings an upper endoscopy was added to her already scheduled  colonoscopy and will be completed at the end of this month.  Today she states she's doing ok. Has had some abdominal pain this week in LLQ and lower abdomen. Pain not bad, but "it's there." No hemorrhoid issues recently. Denies N/V, hematochezia, melena, fever, chills. Weight seems stable (has gained 3 lbs objectively in a month). Denies URI or flu-like symptoms. Denies loss of sense of taste or smell. Denies chest pain, dyspnea, dizziness, lightheadedness, syncope, near syncope. Denies any other upper or lower GI symptoms.  Past Medical History:  Diagnosis Date  . Anxiety   . Arthritis   . Asthma   . Atrial fibrillation, persistent   . Chronic diastolic congestive heart failure ([place])   . Colon adenoma   . Colon cancer ([place])    status post low anterior resection, limited stage disease requiring no adjuvant therapy  . COPD (chronic obstructive pulmonary disease) ([place])   . Depression   . Diverticulosis   . DM (dermatomyositis)   . DVT (deep venous thrombosis) ([name])    in leg- long time ago  . Dyspnea    with activity  . Dysrhythmia   . Esophageal dysphagia   . GERD (gastroesophageal reflux disease)   . Headache   . Heart murmur   . Hematuria   . Hemorrhoids   . Hiatal hernia   . History of kidney stones    x 2  . Hypercholesterolemia   . Incidental pulmonary nodule 05/08/2016   8 mm vague opacity RML noted on CT scan  . Mitral  regurgitation   . PONV (postoperative nausea and vomiting) 2003 ish    with breast biopsy  . S/P minimally invasive maze operation for atrial fibrillation 07/05/2016   Complete bilateral atrial lesion set using cryothermy and bipolar radiofrequency ablation with clipping of LA appendage via right mini thoracotomy approach  . 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 Schatzki's ring   .  Tricuspid regurgitation     Past Surgical History:  Procedure Laterality Date  . APPENDECTOMY    . BREAST SURGERY Right 2003ish   biopsy  . CARDIAC CATHETERIZATION N/A 04/12/2016   Procedure: Right/Left Heart Cath and Coronary Angiography;  Surgeon: Leonie Man, MD;  Location: [facility];  Service: Cardiovascular;  Laterality: N/A;  . CARDIOVERSION N/A 08/16/2017   Procedure: CARDIOVERSION;  Surgeon: Arnoldo Lenis, MD;  Location: [name];  Service: Endoscopy;  Laterality: N/A;  . CATARACT EXTRACTION Bilateral 2017  . CLIPPING OF ATRIAL APPENDAGE  07/05/2016   Procedure: CLIPPING OF ATRIAL APPENDAGE;  Surgeon: Rexene Alberts, MD;  Location: [facility];  Service: Open Heart Surgery;;  . COLONOSCOPY  11/09   Dr. Gala Romney- external hemorrhoidal tags o/w normal rectal mucosa, s/p surgical resection with a normal appearing anastomosis 12cm, pan colonic diverticulum  . COLONOSCOPY N/A 06/02/2012   ZOX:WRUEAV post low anterior resection. Pancolonic diverticulosis. Colonic polyp-tubular adenoma. Surveillance due 2019.   Marland Kitchen COLONOSCOPY N/A 10/13/2015   Procedure: COLONOSCOPY;  Surgeon: Daneil Dolin, MD;  [name];  Service: Endoscopy;  Laterality: N/A;  0930  . ESOPHAGOGASTRODUODENOSCOPY  07/2007   Dr. Daiva Nakayama cervical esophageal web, schatzki ring, large hiatal hernia  . INGUNAL HERNIA REPAIR Left   . LOW ANTERIOR BOWEL RESECTION     NO ADJ CHEMO  . MINIMALLY INVASIVE MAZE PROCEDURE N/A 07/05/2016   Procedure: MINIMALLY INVASIVE MAZE PROCEDURE;  Surgeon: Rexene Alberts, MD;  Location: [facility];  Service: Open Heart Surgery;  Laterality: N/A;  . MITRAL VALVE REPAIR Right 07/05/2016   Procedure: MINIMALLY INVASIVE MITRAL VALVE REPAIR (MVR);  Surgeon: Rexene Alberts, MD;  Location: [facility];  Service: Open Heart Surgery;  Laterality: Right;  . MULTIPLE EXTRACTIONS WITH ALVEOLOPLASTY N/A 05/21/2016   Procedure: MULTIPLE EXTRACTION OF TOOTH #'S 5, 21 WITH ALVEOLOPLASTY AND GROSS  DEBRIDEMENT OF TEETH;  Surgeon: Lenn Cal, DDS;  Location: [facility];  Service: Oral Surgery;  Laterality: N/A;  . TEE WITHOUT CARDIOVERSION N/A 02/20/2016   Procedure: TRANSESOPHAGEAL ECHOCARDIOGRAM (TEE);  Surgeon: Arnoldo Lenis, MD;  [name];  Service: Endoscopy;  Laterality: N/A;  . TEE WITHOUT CARDIOVERSION N/A 07/05/2016   Procedure: TRANSESOPHAGEAL ECHOCARDIOGRAM (TEE);  Surgeon: Rexene Alberts, MD;  Location: [facility];  Service: Open Heart Surgery;  Laterality: N/A;  . TOOTH EXTRACTION    . TUBAL LIGATION    . VEIN LIGATION AND STRIPPING      Current Outpatient Medications  Medication Sig Dispense Refill  . acetaminophen (TYLENOL) 500 MG tablet Take 1,000 mg by mouth 2 (two) times daily as needed for headache.    . ALPRAZolam (XANAX) 0.5 MG tablet Take 0.5 mg by mouth 2 (two) times daily as needed (for anxiety/sleep (scheduled at bedtime)).     Marland Kitchen aspirin EC 81 MG EC tablet Take 1 tablet (81 mg total) by mouth daily.    . calcium carbonate (OS-CAL)  600 MG TABS tablet Take 600 mg by mouth 2 (two) times daily with a meal.    . Coenzyme Q10 100 MG TABS Take 100 mg by mouth every evening.     . furosemide (LASIX) 40 MG tablet Take 40 mg by mouth daily.     Marland Kitchen levothyroxine (SYNTHROID) 88 MCG tablet Take 1 tablet (88 mcg total) by mouth daily before breakfast. 30 tablet 3  . Multiple Vitamin (MULTIVITAMIN) tablet Take 1 tablet by mouth daily.      Vladimir Faster Glycol-Propyl Glycol (SYSTANE ULTRA) 0.4-0.3 % SOLN Place 1 drop into both eyes daily as needed (dry eyes).     . simvastatin (ZOCOR) 40 MG tablet Take 40 mg daily by mouth.    . sodium chloride (OCEAN) 0.65 % SOLN nasal spray Place 1 spray into both nostrils as needed for congestion.    Marland Kitchen warfarin (COUMADIN) 2.5 MG tablet TAKE 1 OR 1 1/2 TABLETS DAILY OR AS DIRECTED BY COUMADIN CLINIC 120 tablet 2   No current facility-administered medications for this visit.     Allergies as of 10/31/2018 - Review Complete  10/31/2018  Allergen Reaction Noted  . Iohexol Hives and Shortness Of Breath 07/16/2008  . Hydrocodone-acetaminophen Hives 04/26/2010  . Nabumetone Rash 04/26/2010  . Prednisone Rash 10/13/2015    Family History  Problem Relation Age of Onset  . Breast cancer Mother   . Rheum arthritis Father   . Cancer - Lung Sister   . Brain cancer Sister   . Prostate cancer Brother   . Aneurysm Brother   . Colon cancer Neg Hx     Social History   Socioeconomic History  . Marital status: Married    Spouse name: Not on file  . Number of children: 2  . Years of education: Not on file  . Highest education level: Not on file  Occupational History  . Not on file  Social Needs  . Financial resource strain: Not on file  . Food insecurity    Worry: Not on file    Inability: Not on file  . Transportation needs    Medical: Not on file    Non-medical: Not on file  Tobacco Use  . Smoking status: Never Smoker  . Smokeless tobacco: Never Used  Substance and Sexual Activity  . Alcohol use: No    Alcohol/week: 0.0 standard drinks  . Drug use: No  . Sexual activity: Not on file  Lifestyle  . Physical activity    Days per week: Not on file    Minutes per session: Not on file  . Stress: Not on file  Relationships  . Social Herbalist on phone: Not on file    Gets together: Not on file    Attends religious service: Not on file    Active member of club or organization: Not on file    Attends meetings of clubs or organizations: Not on file    Relationship status: Not on file  Other Topics Concern  . Not on file  Social History Narrative  . Not on file    Review of Systems: General: Negative for anorexia, weight loss, fever, chills, fatigue, weakness. ENT: Negative for hoarseness, difficulty swallowing. CV: Negative for chest pain, angina, palpitations, peripheral edema.  Respiratory: Negative for dyspnea at rest, cough, sputum, wheezing.  GI: See history of present illness.  Endo: Negative for unusual weight change.  Heme: Negative for bruising or bleeding. Allergy: Negative for rash or hives.  Physical Exam: BP (!) 147/81   Pulse 82   Temp 97.8 F (36.6 C) (Oral)   Ht 5\' 1"  (1.549 m)   Wt 123 lb 6.4 oz (56 kg)   BMI 23.32 kg/m  General:   Alert and oriented. Pleasant and cooperative. Well-nourished and well-developed.  Eyes:  Without icterus, sclera clear and conjunctiva pink.  Ears:  Normal auditory acuity. Cardiovascular:  S1, S2 present without murmurs appreciated. Extremities without clubbing or edema. Respiratory:  Clear to auscultation bilaterally. No wheezes, rales, or rhonchi. No distress.  Gastrointestinal:  +BS, soft, non-tender and non-distended. No HSM noted. No guarding or rebound. No masses appreciated.  Rectal:  Deferred  Musculoskalatal:  Symmetrical without gross deformities. Neurologic:  Alert and oriented x4;  grossly normal neurologically. Psych:  Alert and cooperative. Normal mood and affect. Heme/Lymph/Immune: No excessive bruising noted.    10/31/2018 10:56 AM   Disclaimer: This note was dictated with voice recognition software. Similar sounding words can inadvertently be transcribed and may not be corrected upon review.

## 2018-10-31 NOTE — Assessment & Plan Note (Signed)
Currently doing well with no recent symptoms.  Continue current home regimen of Anusol and sitz baths.  Follow-up as needed.

## 2018-10-31 NOTE — Assessment & Plan Note (Signed)
Today she notes persistent mild but ongoing left lower quadrant and lower abdominal pain this week.  No obvious constipation although he CT did show some stool throughout the colon.  She is already scheduled for a colonoscopy at the end of this month.  Overall her symptoms are not severe or bothersome.  Recommend she continue her current medications follow-up in 3 months.  Keep appointment for colonoscopy and endoscopy.

## 2018-10-31 NOTE — Assessment & Plan Note (Signed)
Recent abnormal CT of the abdomen with large hiatal hernia and gastric wall thickening.  We recommended adding EGD onto her already scheduled colonoscopy which has been completed.  Recommend she keep her appointment for this.  Rare GERD symptoms that is typically resolved with taking Tums.  Recommend she avoid triggers and follow-up as needed.  Further recommendations to follow procedures.

## 2018-10-31 NOTE — Patient Instructions (Signed)
Your health issues we discussed today were:   Abdominal pain, weight loss, abnormal CT scan of your stomach: 1. Your weight is stable compared to your last visit, which is great news 2. You can add Ensure, boost, or other nutritional supplement that needs your 2 g sodium diet 3. You can continue to use Tums as needed.  Let us know if you have more persistent, frequent heartburn symptoms 4. Keep your appointment for your colonoscopy and upper endoscopy on July 30 of this month  Hemorrhoids: 1. I am glad your hemorrhoids are doing well 2. You can continue to use the rectal cream and sitz bath as needed to help prevent worsening symptoms 3. Call us if your symptoms become worse or severe, specifically if you have significant rectal bleeding  Overall I recommend:  1. Continue your other current medications 2. Return for follow-up in 3 months 3. Call us if you have any questions or concerns.   Because of recent events of COVID-19 ("Coronavirus"), follow CDC recommendations:  1. Wash your hand frequently 2. Avoid touching your face 3. Stay away from people who are sick 4. If you have symptoms such as fever, cough, shortness of breath then call your healthcare provider for further guidance 5. If you are sick, STAY AT HOME unless otherwise directed by your healthcare provider. 6. Follow directions from state and national officials regarding staying safe   At [facility] we value your feedback. You may receive a survey about your visit today. Please share your experience as we strive to create trusting relationships with our patients to provide genuine, compassionate, quality care.  We appreciate your understanding and patience as we review any laboratory studies, imaging, and other diagnostic tests that are ordered as we care for you. Our office policy is 5 business days for review of these results, and any emergent or urgent results are addressed in a timely manner for your  best interest. If you do not hear from our office in 1 week, please contact us.   We also encourage the use of MyChart, which contains your medical information for your review as well. If you are not enrolled in this feature, an access code is on this after visit summary for your convenience. Thank you for allowing [name] to be involved in your care.  It was great to see you today!  I hope you have a great summer!!

## 2018-10-31 NOTE — H&P (View-Only) (Signed)
Referring Provider: Glenda Chroman, MD Primary Care Physician:  Glenda Chroman, MD Primary GI:  Dr. Gala Romney  Chief Complaint  Patient presents with  . Hemorrhoids    doing well today  . Abdominal Pain    pain qd this week kt to mid abd    HPI:   [name] [name] is a 78 y.o. female who presents for hemorrhoids and abdominal pain.  The patient was last seen in our office 09/29/2018 for hemorrhoids, lower abdominal pain, right lower quadrant abdominal pain, weight loss.  Due for repeat colonoscopy in 2020 which appears already scheduled for the end of this month.  History of colon polyps.  Noted grade 3/4 hemorrhoids on last colonoscopy with significant symptoms; previously tried apothecary cream and sitz bath with persistent once weekly hemorrhoid bleeding mostly scant toilet tissue hematochezia.  Difficulty in maintaining adequate hygiene.  At her last visit noted recent shingles.  No hemorrhoid problems at that time, recurrent abdominal pain 2 weeks ago of left lower quadrant and lower abdomen described as burning and intermittent which will typically last for 2 days and then let up for a couple days.  Has never had pain like this before, no nausea vomiting, no identified triggers.  Occasionally her pain is severe 6-7 out of 10.  Has lost 20 pounds in the past month despite good appetite, typically consumes 3 meals a day.  She is on a 2 g sodium diet for history of chronic diastolic congestive heart failure.  Recommended CT of the abdomen, schedule colonoscopy, Ensure or similar low-sodium product 1-2 times a day, follow-up in 4 to 6 weeks.  CT completed noted minimally prominent stool throughout the colon, large hiatal hernia with questionable wall thickening of the stomach that cannot exclude gastritis or infiltrative process and recommended upper endoscopy, probable tiny bilateral inguinal hernias containing fat.  Due to her CT findings an upper endoscopy was added to her already scheduled  colonoscopy and will be completed at the end of this month.  Today she states she's doing ok. Has had some abdominal pain this week in LLQ and lower abdomen. Pain not bad, but "it's there." No hemorrhoid issues recently. Denies N/V, hematochezia, melena, fever, chills. Weight seems stable (has gained 3 lbs objectively in a month). Denies URI or flu-like symptoms. Denies loss of sense of taste or smell. Denies chest pain, dyspnea, dizziness, lightheadedness, syncope, near syncope. Denies any other upper or lower GI symptoms.  Past Medical History:  Diagnosis Date  . Anxiety   . Arthritis   . Asthma   . Atrial fibrillation, persistent   . Chronic diastolic congestive heart failure ([place])   . Colon adenoma   . Colon cancer ([place])    status post low anterior resection, limited stage disease requiring no adjuvant therapy  . COPD (chronic obstructive pulmonary disease) ([place])   . Depression   . Diverticulosis   . DM (dermatomyositis)   . DVT (deep venous thrombosis) ([name])    in leg- long time ago  . Dyspnea    with activity  . Dysrhythmia   . Esophageal dysphagia   . GERD (gastroesophageal reflux disease)   . Headache   . Heart murmur   . Hematuria   . Hemorrhoids   . Hiatal hernia   . History of kidney stones    x 2  . Hypercholesterolemia   . Incidental pulmonary nodule 05/08/2016   8 mm vague opacity RML noted on CT scan  . Mitral  regurgitation   . PONV (postoperative nausea and vomiting) 2003 ish    with breast biopsy  . S/P minimally invasive maze operation for atrial fibrillation 07/05/2016   Complete bilateral atrial lesion set using cryothermy and bipolar radiofrequency ablation with clipping of LA appendage via right mini thoracotomy approach  . 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 Schatzki's ring   .  Tricuspid regurgitation     Past Surgical History:  Procedure Laterality Date  . APPENDECTOMY    . BREAST SURGERY Right 2003ish   biopsy  . CARDIAC CATHETERIZATION N/A 04/12/2016   Procedure: Right/Left Heart Cath and Coronary Angiography;  Surgeon: Leonie Man, MD;  Location: [facility];  Service: Cardiovascular;  Laterality: N/A;  . CARDIOVERSION N/A 08/16/2017   Procedure: CARDIOVERSION;  Surgeon: Arnoldo Lenis, MD;  Location: [name];  Service: Endoscopy;  Laterality: N/A;  . CATARACT EXTRACTION Bilateral 2017  . CLIPPING OF ATRIAL APPENDAGE  07/05/2016   Procedure: CLIPPING OF ATRIAL APPENDAGE;  Surgeon: Rexene Alberts, MD;  Location: [facility];  Service: Open Heart Surgery;;  . COLONOSCOPY  11/09   Dr. Gala Romney- external hemorrhoidal tags o/w normal rectal mucosa, s/p surgical resection with a normal appearing anastomosis 12cm, pan colonic diverticulum  . COLONOSCOPY N/A 06/02/2012   QJJ:HERDEY post low anterior resection. Pancolonic diverticulosis. Colonic polyp-tubular adenoma. Surveillance due 2019.   Marland Kitchen COLONOSCOPY N/A 10/13/2015   Procedure: COLONOSCOPY;  Surgeon: Daneil Dolin, MD;  [facility]: [name];  Service: Endoscopy;  Laterality: N/A;  0930  . ESOPHAGOGASTRODUODENOSCOPY  07/2007   Dr. Daiva Nakayama cervical esophageal web, schatzki ring, large hiatal hernia  . INGUNAL HERNIA REPAIR Left   . LOW ANTERIOR BOWEL RESECTION     NO ADJ CHEMO  . MINIMALLY INVASIVE MAZE PROCEDURE N/A 07/05/2016   Procedure: MINIMALLY INVASIVE MAZE PROCEDURE;  Surgeon: Rexene Alberts, MD;  Location: [facility];  Service: Open Heart Surgery;  Laterality: N/A;  . MITRAL VALVE REPAIR Right 07/05/2016   Procedure: MINIMALLY INVASIVE MITRAL VALVE REPAIR (MVR);  Surgeon: Rexene Alberts, MD;  Location: [facility];  Service: Open Heart Surgery;  Laterality: Right;  . MULTIPLE EXTRACTIONS WITH ALVEOLOPLASTY N/A 05/21/2016   Procedure: MULTIPLE EXTRACTION OF TOOTH #'S 5, 21 WITH ALVEOLOPLASTY AND GROSS  DEBRIDEMENT OF TEETH;  Surgeon: Lenn Cal, DDS;  Location: [facility];  Service: Oral Surgery;  Laterality: N/A;  . TEE WITHOUT CARDIOVERSION N/A 02/20/2016   Procedure: TRANSESOPHAGEAL ECHOCARDIOGRAM (TEE);  Surgeon: Arnoldo Lenis, MD;  [name];  Service: Endoscopy;  Laterality: N/A;  . TEE WITHOUT CARDIOVERSION N/A 07/05/2016   Procedure: TRANSESOPHAGEAL ECHOCARDIOGRAM (TEE);  Surgeon: Rexene Alberts, MD;  Location: [facility];  Service: Open Heart Surgery;  Laterality: N/A;  . TOOTH EXTRACTION    . TUBAL LIGATION    . VEIN LIGATION AND STRIPPING      Current Outpatient Medications  Medication Sig Dispense Refill  . acetaminophen (TYLENOL) 500 MG tablet Take 1,000 mg by mouth 2 (two) times daily as needed for headache.    . ALPRAZolam (XANAX) 0.5 MG tablet Take 0.5 mg by mouth 2 (two) times daily as needed (for anxiety/sleep (scheduled at bedtime)).     Marland Kitchen aspirin EC 81 MG EC tablet Take 1 tablet (81 mg total) by mouth daily.    . calcium carbonate (OS-CAL)  600 MG TABS tablet Take 600 mg by mouth 2 (two) times daily with a meal.    . Coenzyme Q10 100 MG TABS Take 100 mg by mouth every evening.     . furosemide (LASIX) 40 MG tablet Take 40 mg by mouth daily.     Marland Kitchen levothyroxine (SYNTHROID) 88 MCG tablet Take 1 tablet (88 mcg total) by mouth daily before breakfast. 30 tablet 3  . Multiple Vitamin (MULTIVITAMIN) tablet Take 1 tablet by mouth daily.      Vladimir Faster Glycol-Propyl Glycol (SYSTANE ULTRA) 0.4-0.3 % SOLN Place 1 drop into both eyes daily as needed (dry eyes).     . simvastatin (ZOCOR) 40 MG tablet Take 40 mg daily by mouth.    . sodium chloride (OCEAN) 0.65 % SOLN nasal spray Place 1 spray into both nostrils as needed for congestion.    Marland Kitchen warfarin (COUMADIN) 2.5 MG tablet TAKE 1 OR 1 1/2 TABLETS DAILY OR AS DIRECTED BY COUMADIN CLINIC 120 tablet 2   No current facility-administered medications for this visit.     Allergies as of 10/31/2018 - Review Complete  10/31/2018  Allergen Reaction Noted  . Iohexol Hives and Shortness Of Breath 07/16/2008  . Hydrocodone-acetaminophen Hives 04/26/2010  . Nabumetone Rash 04/26/2010  . Prednisone Rash 10/13/2015    Family History  Problem Relation Age of Onset  . Breast cancer Mother   . Rheum arthritis Father   . Cancer - Lung Sister   . Brain cancer Sister   . Prostate cancer Brother   . Aneurysm Brother   . Colon cancer Neg Hx     Social History   Socioeconomic History  . Marital status: Married    Spouse name: Not on file  . Number of children: 2  . Years of education: Not on file  . Highest education level: Not on file  Occupational History  . Not on file  Social Needs  . Financial resource strain: Not on file  . Food insecurity    Worry: Not on file    Inability: Not on file  . Transportation needs    Medical: Not on file    Non-medical: Not on file  Tobacco Use  . Smoking status: Never Smoker  . Smokeless tobacco: Never Used  Substance and Sexual Activity  . Alcohol use: No    Alcohol/week: 0.0 standard drinks  . Drug use: No  . Sexual activity: Not on file  Lifestyle  . Physical activity    Days per week: Not on file    Minutes per session: Not on file  . Stress: Not on file  Relationships  . Social Herbalist on phone: Not on file    Gets together: Not on file    Attends religious service: Not on file    Active member of club or organization: Not on file    Attends meetings of clubs or organizations: Not on file    Relationship status: Not on file  Other Topics Concern  . Not on file  Social History Narrative  . Not on file    Review of Systems: General: Negative for anorexia, weight loss, fever, chills, fatigue, weakness. ENT: Negative for hoarseness, difficulty swallowing. CV: Negative for chest pain, angina, palpitations, peripheral edema.  Respiratory: Negative for dyspnea at rest, cough, sputum, wheezing.  GI: See history of present illness.  Endo: Negative for unusual weight change.  Heme: Negative for bruising or bleeding. Allergy: Negative for rash or hives.  Physical Exam: BP (!) 147/81   Pulse 82   Temp 97.8 F (36.6 C) (Oral)   Ht 5\' 1"  (1.549 m)   Wt 123 lb 6.4 oz (56 kg)   BMI 23.32 kg/m  General:   Alert and oriented. Pleasant and cooperative. Well-nourished and well-developed.  Eyes:  Without icterus, sclera clear and conjunctiva pink.  Ears:  Normal auditory acuity. Cardiovascular:  S1, S2 present without murmurs appreciated. Extremities without clubbing or edema. Respiratory:  Clear to auscultation bilaterally. No wheezes, rales, or rhonchi. No distress.  Gastrointestinal:  +BS, soft, non-tender and non-distended. No HSM noted. No guarding or rebound. No masses appreciated.  Rectal:  Deferred  Musculoskalatal:  Symmetrical without gross deformities. Neurologic:  Alert and oriented x4;  grossly normal neurologically. Psych:  Alert and cooperative. Normal mood and affect. Heme/Lymph/Immune: No excessive bruising noted.    10/31/2018 10:56 AM   Disclaimer: This note was dictated with voice recognition software. Similar sounding words can inadvertently be transcribed and may not be corrected upon review.

## 2018-11-03 ENCOUNTER — Other Ambulatory Visit: Payer: Self-pay | Admitting: *Deleted

## 2018-11-03 ENCOUNTER — Encounter: Payer: Self-pay | Admitting: Internal Medicine

## 2018-11-03 MED ORDER — WARFARIN SODIUM 2.5 MG PO TABS: ORAL_TABLET | ORAL | 2 refills | Status: DC

## 2018-11-03 NOTE — Progress Notes (Signed)
CC'D TO PCP °

## 2018-11-06 ENCOUNTER — Ambulatory Visit (INDEPENDENT_AMBULATORY_CARE_PROVIDER_SITE_OTHER): Payer: Medicare Other | Admitting: *Deleted

## 2018-11-06 DIAGNOSIS — Z5181 Encounter for therapeutic drug level monitoring: Secondary | ICD-10-CM | POA: Diagnosis not present

## 2018-11-06 DIAGNOSIS — I4819 Other persistent atrial fibrillation: Secondary | ICD-10-CM | POA: Diagnosis not present

## 2018-11-06 DIAGNOSIS — Z9889 Other specified postprocedural states: Secondary | ICD-10-CM | POA: Diagnosis not present

## 2018-11-06 LAB — POCT INR: INR: 2.5 (ref 2.0–3.0)

## 2018-11-06 NOTE — Patient Instructions (Signed)
Continue coumadin 2 tablets daily except 3 tablets on Tuesdays and Fridays Pending colonoscopy 11/20/18.  Will hold coumadin 4 days before procedure and resume night of procedure Recheck in 3 weeks

## 2018-11-13 DIAGNOSIS — Z6823 Body mass index (BMI) 23.0-23.9, adult: Secondary | ICD-10-CM | POA: Diagnosis not present

## 2018-11-13 DIAGNOSIS — Z299 Encounter for prophylactic measures, unspecified: Secondary | ICD-10-CM | POA: Diagnosis not present

## 2018-11-13 DIAGNOSIS — E039 Hypothyroidism, unspecified: Secondary | ICD-10-CM | POA: Diagnosis not present

## 2018-11-13 DIAGNOSIS — E785 Hyperlipidemia, unspecified: Secondary | ICD-10-CM | POA: Diagnosis not present

## 2018-11-13 DIAGNOSIS — Z79899 Other long term (current) drug therapy: Secondary | ICD-10-CM | POA: Diagnosis not present

## 2018-11-13 DIAGNOSIS — Z0000 Encounter for general adult medical examination without abnormal findings: Secondary | ICD-10-CM | POA: Diagnosis not present

## 2018-11-13 DIAGNOSIS — Z7189 Other specified counseling: Secondary | ICD-10-CM | POA: Diagnosis not present

## 2018-11-13 DIAGNOSIS — Z1339 Encounter for screening examination for other mental health and behavioral disorders: Secondary | ICD-10-CM | POA: Diagnosis not present

## 2018-11-13 DIAGNOSIS — D509 Iron deficiency anemia, unspecified: Secondary | ICD-10-CM | POA: Diagnosis not present

## 2018-11-13 DIAGNOSIS — Z1331 Encounter for screening for depression: Secondary | ICD-10-CM | POA: Diagnosis not present

## 2018-11-17 ENCOUNTER — Encounter (HOSPITAL_COMMUNITY)
Admission: RE | Admit: 2018-11-17 | Discharge: 2018-11-17 | Disposition: A | Discharge status: Home / Self Care | Payer: Medicare Other | Source: Ambulatory Visit | Attending: Internal Medicine | Admitting: Internal Medicine

## 2018-11-17 ENCOUNTER — Other Ambulatory Visit (HOSPITAL_COMMUNITY): Payer: Medicare Other

## 2018-11-17 ENCOUNTER — Other Ambulatory Visit: Payer: Self-pay

## 2018-11-17 ENCOUNTER — Encounter (HOSPITAL_COMMUNITY): Payer: Self-pay

## 2018-11-17 ENCOUNTER — Other Ambulatory Visit (HOSPITAL_COMMUNITY)
Admission: RE | Admit: 2018-11-17 | Discharge: 2018-11-17 | Disposition: A | Discharge status: Home / Self Care | Payer: Medicare Other | Source: Ambulatory Visit | Attending: Internal Medicine | Admitting: Internal Medicine

## 2018-11-17 DIAGNOSIS — Z20828 Contact with and (suspected) exposure to other viral communicable diseases: Secondary | ICD-10-CM | POA: Diagnosis not present

## 2018-11-17 DIAGNOSIS — Z86010 Personal history of colonic polyps: Secondary | ICD-10-CM | POA: Diagnosis not present

## 2018-11-17 DIAGNOSIS — I4819 Other persistent atrial fibrillation: Secondary | ICD-10-CM | POA: Diagnosis not present

## 2018-11-17 DIAGNOSIS — K649 Unspecified hemorrhoids: Secondary | ICD-10-CM | POA: Diagnosis not present

## 2018-11-17 DIAGNOSIS — Z7982 Long term (current) use of aspirin: Secondary | ICD-10-CM | POA: Diagnosis not present

## 2018-11-17 DIAGNOSIS — M1990 Unspecified osteoarthritis, unspecified site: Secondary | ICD-10-CM | POA: Diagnosis not present

## 2018-11-17 DIAGNOSIS — E7800 Pure hypercholesterolemia, unspecified: Secondary | ICD-10-CM | POA: Diagnosis not present

## 2018-11-17 DIAGNOSIS — Z86718 Personal history of other venous thrombosis and embolism: Secondary | ICD-10-CM | POA: Diagnosis not present

## 2018-11-17 DIAGNOSIS — Z01812 Encounter for preprocedural laboratory examination: Secondary | ICD-10-CM | POA: Insufficient documentation

## 2018-11-17 DIAGNOSIS — R1031 Right lower quadrant pain: Secondary | ICD-10-CM | POA: Diagnosis not present

## 2018-11-17 DIAGNOSIS — Z7901 Long term (current) use of anticoagulants: Secondary | ICD-10-CM | POA: Diagnosis not present

## 2018-11-17 DIAGNOSIS — F419 Anxiety disorder, unspecified: Secondary | ICD-10-CM | POA: Diagnosis not present

## 2018-11-17 DIAGNOSIS — K635 Polyp of colon: Secondary | ICD-10-CM | POA: Diagnosis not present

## 2018-11-17 DIAGNOSIS — Z85038 Personal history of other malignant neoplasm of large intestine: Secondary | ICD-10-CM | POA: Diagnosis not present

## 2018-11-17 DIAGNOSIS — I5032 Chronic diastolic (congestive) heart failure: Secondary | ICD-10-CM | POA: Diagnosis not present

## 2018-11-17 DIAGNOSIS — M3313 Other dermatomyositis without myopathy: Secondary | ICD-10-CM | POA: Diagnosis not present

## 2018-11-17 DIAGNOSIS — Z79899 Other long term (current) drug therapy: Secondary | ICD-10-CM | POA: Diagnosis not present

## 2018-11-17 DIAGNOSIS — F329 Major depressive disorder, single episode, unspecified: Secondary | ICD-10-CM | POA: Diagnosis not present

## 2018-11-17 DIAGNOSIS — K5730 Diverticulosis of large intestine without perforation or abscess without bleeding: Secondary | ICD-10-CM | POA: Diagnosis not present

## 2018-11-17 DIAGNOSIS — K449 Diaphragmatic hernia without obstruction or gangrene: Secondary | ICD-10-CM | POA: Diagnosis not present

## 2018-11-17 DIAGNOSIS — R933 Abnormal findings on diagnostic imaging of other parts of digestive tract: Secondary | ICD-10-CM | POA: Diagnosis not present

## 2018-11-17 DIAGNOSIS — Z1159 Encounter for screening for other viral diseases: Secondary | ICD-10-CM | POA: Insufficient documentation

## 2018-11-17 DIAGNOSIS — J449 Chronic obstructive pulmonary disease, unspecified: Secondary | ICD-10-CM | POA: Diagnosis not present

## 2018-11-17 DIAGNOSIS — K219 Gastro-esophageal reflux disease without esophagitis: Secondary | ICD-10-CM | POA: Diagnosis not present

## 2018-11-17 DIAGNOSIS — Z79890 Hormone replacement therapy (postmenopausal): Secondary | ICD-10-CM | POA: Diagnosis not present

## 2018-11-17 NOTE — Patient Instructions (Signed)
[name] [name]  11/17/2018     @PREFPERIOPPHARMACY @   Your procedure is scheduled on  11/20/2018  Report to [facility] at  1:00  P.M.  Call this number if you have problems the morning of surgery:  [phone]   Remember:  Follow the diet and prep instructions given to you by Dr Roseanne Kaufman office.                    Take these medicines the morning of surgery with A SIP OF WATER  Xanax, levothyroxine. Use your nebulizer before you come. DO NOT take any more coumadin before your procedure.    Do not wear jewelry, make-up or nail polish.  Do not wear lotions, powders, or perfumes, or deodorant.  Do not shave 48 hours prior to surgery.  Men may shave face and neck.  Do not bring valuables to the hospital.  [facility] is not responsible for any belongings or valuables.  Contacts, dentures or bridgework may not be worn into surgery.  Leave your suitcase in the car.  After surgery it may be brought to your room.  For patients admitted to the hospital, discharge time will be determined by your treatment team.  Patients discharged the day of surgery will not be allowed to drive home.   Name and phone number of your driver:   family Special instructions:  None  Please read over the following fact sheets that you were given. Anesthesia Post-op Instructions and Care and Recovery After Surgery       Upper Endoscopy, Adult, Care After This sheet gives you information about how to care for yourself after your procedure. Your health care provider may also give you more specific instructions. If you have problems or questions, contact your health care provider. What can I expect after the procedure? After the procedure, it is common to have:  A sore throat.  Mild stomach pain or discomfort.  Bloating.  Nausea. Follow these instructions at home:   Follow instructions from your health care provider about what to eat or drink after your procedure.  Return to your  normal activities as told by your health care provider. Ask your health care provider what activities are safe for you.  Take over-the-counter and prescription medicines only as told by your health care provider.  Do not drive for 24 hours if you were given a sedative during your procedure.  Keep all follow-up visits as told by your health care provider. This is important. Contact a health care provider if you have:  A sore throat that lasts longer than one day.  Trouble swallowing. Get help right away if:  You vomit blood or your vomit looks like coffee grounds.  You have: ? A fever. ? Bloody, black, or tarry stools. ? A severe sore throat or you cannot swallow. ? Difficulty breathing. ? Severe pain in your chest or abdomen. Summary  After the procedure, it is common to have a sore throat, mild stomach discomfort, bloating, and nausea.  Do not drive for 24 hours if you were given a sedative during the procedure.  Follow instructions from your health care provider about what to eat or drink after your procedure.  Return to your normal activities as told by your health care provider. This information is not intended to replace advice given to you by your health care provider. Make sure you discuss any questions you have with your health care provider.  Document Released: 10/09/2011 Document Revised: 10/01/2017 Document Reviewed: 09/09/2017 Elsevier Patient Education  2020 [employer].  Colonoscopy, Adult, Care After This sheet gives you information about how to care for yourself after your procedure. Your health care provider may also give you more specific instructions. If you have problems or questions, contact your health care provider. What can I expect after the procedure? After the procedure, it is common to have:  A small amount of blood in your stool for 24 hours after the procedure.  Some gas.  Mild abdominal cramping or bloating. Follow these instructions at  home: General instructions  For the first 24 hours after the procedure: ? Do not drive or use machinery. ? Do not sign important documents. ? Do not drink alcohol. ? Do your regular daily activities at a slower pace than normal. ? Eat soft, easy-to-digest foods.  Take over-the-counter or prescription medicines only as told by your health care provider. Relieving cramping and bloating   Try walking around when you have cramps or feel bloated.  Apply heat to your abdomen as told by your health care provider. Use a heat source that your health care provider recommends, such as a moist heat pack or a heating pad. ? Place a towel between your skin and the heat source. ? Leave the heat on for 20-30 minutes. ? Remove the heat if your skin turns bright red. This is especially important if you are unable to feel pain, heat, or cold. You may have a greater risk of getting burned. Eating and drinking   Drink enough fluid to keep your urine pale yellow.  Resume your normal diet as instructed by your health care provider. Avoid heavy or fried foods that are hard to digest.  Avoid drinking alcohol for as long as instructed by your health care provider. Contact a health care provider if:  You have blood in your stool 2-3 days after the procedure. Get help right away if:  You have more than a small spotting of blood in your stool.  You pass large blood clots in your stool.  Your abdomen is swollen.  You have nausea or vomiting.  You have a fever.  You have increasing abdominal pain that is not relieved with medicine. Summary  After the procedure, it is common to have a small amount of blood in your stool. You may also have mild abdominal cramping and bloating.  For the first 24 hours after the procedure, do not drive or use machinery, sign important documents, or drink alcohol.  Contact your health care provider if you have a lot of blood in your stool, nausea or vomiting, a fever,  or increased abdominal pain. This information is not intended to replace advice given to you by your health care provider. Make sure you discuss any questions you have with your health care provider. Document Released: 11/22/2003 Document Revised: 01/30/2017 Document Reviewed: 06/21/2015 Elsevier Patient Education  2020 [employer] After These instructions provide you with information about caring for yourself after your procedure. Your health care provider may also give you more specific instructions. Your treatment has been planned according to current medical practices, but problems sometimes occur. Call your health care provider if you have any problems or questions after your procedure. What can I expect after the procedure? After your procedure, you may:  Feel sleepy for several hours.  Feel clumsy and have poor balance for several hours.  Feel forgetful about what happened after the procedure.  Have poor judgment for several hours.  Feel nauseous or vomit.  Have a sore throat if you had a breathing tube during the procedure. Follow these instructions at home: For at least 24 hours after the procedure:      Have a responsible adult stay with you. It is important to have someone help care for you until you are awake and alert.  Rest as needed.  Do not: ? Participate in activities in which you could fall or become injured. ? Drive. ? Use heavy machinery. ? Drink alcohol. ? Take sleeping pills or medicines that cause drowsiness. ? Make important decisions or sign legal documents. ? Take care of children on your own. Eating and drinking  Follow the diet that is recommended by your health care provider.  If you vomit, drink water, juice, or soup when you can drink without vomiting.  Make sure you have little or no nausea before eating solid foods. General instructions  Take over-the-counter and prescription medicines only as told by  your health care provider.  If you have sleep apnea, surgery and certain medicines can increase your risk for breathing problems. Follow instructions from your health care provider about wearing your sleep device: ? Anytime you are sleeping, including during daytime naps. ? While taking prescription pain medicines, sleeping medicines, or medicines that make you drowsy.  If you smoke, do not smoke without supervision.  Keep all follow-up visits as told by your health care provider. This is important. Contact a health care provider if:  You keep feeling nauseous or you keep vomiting.  You feel light-headed.  You develop a rash.  You have a fever. Get help right away if:  You have trouble breathing. Summary  For several hours after your procedure, you may feel sleepy and have poor judgment.  Have a responsible adult stay with you for at least 24 hours or until you are awake and alert. This information is not intended to replace advice given to you by your health care provider. Make sure you discuss any questions you have with your health care provider. Document Released: 07/31/2015 Document Revised: 07/08/2017 Document Reviewed: 07/31/2015 Elsevier Patient Education  2020 [employer].

## 2018-11-18 LAB — SARS CORONAVIRUS 2 (TAT 6-24 HRS): SARS Coronavirus 2: NEGATIVE

## 2018-11-19 ENCOUNTER — Telehealth: Payer: Self-pay | Admitting: *Deleted

## 2018-11-19 NOTE — Telephone Encounter (Signed)
Called patient. She is aware new procedure time for 7/30 is now 11:30am, arrival time 10:00am. Patient aware she will drink 2nd half of prep in the AM at 6:30am, npo 8:30am. She voiced understanding. Called endo and LMOVM making aware of change

## 2018-11-20 ENCOUNTER — Encounter (HOSPITAL_COMMUNITY): Payer: Self-pay | Admitting: *Deleted

## 2018-11-20 ENCOUNTER — Other Ambulatory Visit: Payer: Self-pay

## 2018-11-20 ENCOUNTER — Ambulatory Visit (HOSPITAL_COMMUNITY)
Admission: RE | Admit: 2018-11-20 | Discharge: 2018-11-20 | Disposition: A | Discharge status: Home / Self Care | Payer: Medicare Other | Attending: Internal Medicine | Admitting: Internal Medicine

## 2018-11-20 ENCOUNTER — Ambulatory Visit (HOSPITAL_COMMUNITY): Payer: Medicare Other | Admitting: Anesthesiology

## 2018-11-20 ENCOUNTER — Ambulatory Visit (HOSPITAL_COMMUNITY): Admit: 2018-11-20 | Payer: Medicare Other | Admitting: Internal Medicine

## 2018-11-20 ENCOUNTER — Encounter (HOSPITAL_COMMUNITY)
Admission: RE | Disposition: A | Discharge status: Home / Self Care | Payer: Self-pay | Source: Home / Self Care | Attending: Internal Medicine

## 2018-11-20 ENCOUNTER — Encounter (HOSPITAL_COMMUNITY): Payer: Self-pay

## 2018-11-20 DIAGNOSIS — Z85038 Personal history of other malignant neoplasm of large intestine: Secondary | ICD-10-CM | POA: Diagnosis not present

## 2018-11-20 DIAGNOSIS — K3189 Other diseases of stomach and duodenum: Secondary | ICD-10-CM

## 2018-11-20 DIAGNOSIS — R1031 Right lower quadrant pain: Secondary | ICD-10-CM | POA: Diagnosis not present

## 2018-11-20 DIAGNOSIS — R933 Abnormal findings on diagnostic imaging of other parts of digestive tract: Secondary | ICD-10-CM | POA: Diagnosis not present

## 2018-11-20 DIAGNOSIS — Z20828 Contact with and (suspected) exposure to other viral communicable diseases: Secondary | ICD-10-CM | POA: Insufficient documentation

## 2018-11-20 DIAGNOSIS — Z79890 Hormone replacement therapy (postmenopausal): Secondary | ICD-10-CM | POA: Insufficient documentation

## 2018-11-20 DIAGNOSIS — E7800 Pure hypercholesterolemia, unspecified: Secondary | ICD-10-CM | POA: Insufficient documentation

## 2018-11-20 DIAGNOSIS — Z86010 Personal history of colonic polyps: Secondary | ICD-10-CM | POA: Diagnosis not present

## 2018-11-20 DIAGNOSIS — K649 Unspecified hemorrhoids: Secondary | ICD-10-CM | POA: Diagnosis not present

## 2018-11-20 DIAGNOSIS — I4819 Other persistent atrial fibrillation: Secondary | ICD-10-CM | POA: Diagnosis not present

## 2018-11-20 DIAGNOSIS — Z7982 Long term (current) use of aspirin: Secondary | ICD-10-CM | POA: Insufficient documentation

## 2018-11-20 DIAGNOSIS — I5032 Chronic diastolic (congestive) heart failure: Secondary | ICD-10-CM | POA: Insufficient documentation

## 2018-11-20 DIAGNOSIS — M3313 Other dermatomyositis without myopathy: Secondary | ICD-10-CM | POA: Insufficient documentation

## 2018-11-20 DIAGNOSIS — K219 Gastro-esophageal reflux disease without esophagitis: Secondary | ICD-10-CM | POA: Insufficient documentation

## 2018-11-20 DIAGNOSIS — F419 Anxiety disorder, unspecified: Secondary | ICD-10-CM | POA: Insufficient documentation

## 2018-11-20 DIAGNOSIS — R634 Abnormal weight loss: Secondary | ICD-10-CM

## 2018-11-20 DIAGNOSIS — K449 Diaphragmatic hernia without obstruction or gangrene: Secondary | ICD-10-CM | POA: Insufficient documentation

## 2018-11-20 DIAGNOSIS — Z09 Encounter for follow-up examination after completed treatment for conditions other than malignant neoplasm: Secondary | ICD-10-CM | POA: Diagnosis not present

## 2018-11-20 DIAGNOSIS — F329 Major depressive disorder, single episode, unspecified: Secondary | ICD-10-CM | POA: Insufficient documentation

## 2018-11-20 DIAGNOSIS — R1030 Lower abdominal pain, unspecified: Secondary | ICD-10-CM

## 2018-11-20 DIAGNOSIS — K5730 Diverticulosis of large intestine without perforation or abscess without bleeding: Secondary | ICD-10-CM | POA: Insufficient documentation

## 2018-11-20 DIAGNOSIS — K635 Polyp of colon: Secondary | ICD-10-CM | POA: Diagnosis not present

## 2018-11-20 DIAGNOSIS — M1990 Unspecified osteoarthritis, unspecified site: Secondary | ICD-10-CM | POA: Insufficient documentation

## 2018-11-20 DIAGNOSIS — Z86718 Personal history of other venous thrombosis and embolism: Secondary | ICD-10-CM | POA: Insufficient documentation

## 2018-11-20 DIAGNOSIS — J449 Chronic obstructive pulmonary disease, unspecified: Secondary | ICD-10-CM | POA: Insufficient documentation

## 2018-11-20 DIAGNOSIS — Z79899 Other long term (current) drug therapy: Secondary | ICD-10-CM | POA: Insufficient documentation

## 2018-11-20 DIAGNOSIS — Z7901 Long term (current) use of anticoagulants: Secondary | ICD-10-CM | POA: Insufficient documentation

## 2018-11-20 HISTORY — PX: POLYPECTOMY: SHX5525

## 2018-11-20 HISTORY — PX: COLONOSCOPY WITH PROPOFOL: SHX5780

## 2018-11-20 HISTORY — PX: ESOPHAGOGASTRODUODENOSCOPY (EGD) WITH PROPOFOL: SHX5813

## 2018-11-20 SURGERY — COLONOSCOPY WITH PROPOFOL
Anesthesia: General

## 2018-11-20 SURGERY — COLONOSCOPY WITH PROPOFOL
Anesthesia: Monitor Anesthesia Care

## 2018-11-20 MED ORDER — PROMETHAZINE HCL 25 MG/ML IJ SOLN: 6.2500 mg | INTRAMUSCULAR | Status: DC | PRN

## 2018-11-20 MED ORDER — ONDANSETRON HCL 4 MG/2ML IJ SOLN
INTRAMUSCULAR | Status: AC
  Filled 2018-11-20: qty 2

## 2018-11-20 MED ORDER — PROPOFOL 500 MG/50ML IV EMUL
INTRAVENOUS | Status: DC | PRN
  Administered 2018-11-20: 150 ug/kg/min via INTRAVENOUS
  Administered 2018-11-20: 100 ug/kg/min via INTRAVENOUS

## 2018-11-20 MED ORDER — ONDANSETRON HCL 4 MG/2ML IJ SOLN
INTRAMUSCULAR | Status: DC | PRN
  Administered 2018-11-20: 4 mg via INTRAVENOUS

## 2018-11-20 MED ORDER — CHLORHEXIDINE GLUCONATE CLOTH 2 % EX PADS: 6.0000 | MEDICATED_PAD | Freq: Once | CUTANEOUS | Status: DC

## 2018-11-20 MED ORDER — PROPOFOL 10 MG/ML IV BOLUS
INTRAVENOUS | Status: AC
  Filled 2018-11-20: qty 20

## 2018-11-20 MED ORDER — LACTATED RINGERS IV SOLN
INTRAVENOUS | Status: DC
  Administered 2018-11-20: 11:00:00 1000 mL via INTRAVENOUS

## 2018-11-20 MED ORDER — PROPOFOL 10 MG/ML IV BOLUS
INTRAVENOUS | Status: DC | PRN
  Administered 2018-11-20: 60 mg via INTRAVENOUS
  Administered 2018-11-20: 20 mg via INTRAVENOUS

## 2018-11-20 MED ORDER — KETAMINE HCL 50 MG/5ML IJ SOSY
PREFILLED_SYRINGE | INTRAMUSCULAR | Status: AC
  Filled 2018-11-20: qty 5

## 2018-11-20 MED ORDER — LIDOCAINE HCL (PF) 1 % IJ SOLN
INTRAMUSCULAR | Status: AC
  Filled 2018-11-20: qty 5

## 2018-11-20 MED ORDER — MIDAZOLAM HCL 2 MG/2ML IJ SOLN: 0.5000 mg | Freq: Once | INTRAMUSCULAR | Status: DC | PRN

## 2018-11-20 NOTE — Anesthesia Postprocedure Evaluation (Signed)
Anesthesia Post Note  Patient: [name] [name]  Procedure(s) Performed: COLONOSCOPY WITH PROPOFOL (N/A ) ESOPHAGOGASTRODUODENOSCOPY (EGD) WITH PROPOFOL (N/A ) POLYPECTOMY  Patient location during evaluation: PACU Anesthesia Type: General Level of consciousness: awake and alert and oriented Pain management: pain level controlled Vital Signs Assessment: post-procedure vital signs reviewed and stable Respiratory status: spontaneous breathing and respiratory function stable Cardiovascular status: stable Postop Assessment: no apparent nausea or vomiting Anesthetic complications: no     Last Vitals:  Vitals:   11/20/18 1217 11/20/18 1223  BP: 121/61 (!) 151/71  Pulse: 74 71  Resp: (!) 23 (!) 21  Temp:    SpO2: 98% 98%    Last Pain:  Vitals:   11/20/18 1223  TempSrc:   PainSc: 0-No pain                 [name] A

## 2018-11-20 NOTE — Discharge Instructions (Signed)
Colonoscopy Discharge Instructions  Read the instructions outlined below and refer to this sheet in the next few weeks. These discharge instructions provide you with general information on caring for yourself after you leave the hospital. Your doctor may also give you specific instructions. While your treatment has been planned according to the most current medical practices available, unavoidable complications occasionally occur. If you have any problems or questions after discharge, call Dr. Gala Romney at [phone]. ACTIVITY  You may resume your regular activity, but move at a slower pace for the next 24 hours.   Take frequent rest periods for the next 24 hours.   Walking will help get rid of the air and reduce the bloated feeling in your belly (abdomen).   No driving for 24 hours (because of the medicine (anesthesia) used during the test).    Do not sign any important legal documents or operate any machinery for 24 hours (because of the anesthesia used during the test).  NUTRITION  Drink plenty of fluids.   You may resume your normal diet as instructed by your doctor.   Begin with a light meal and progress to your normal diet. Heavy or fried foods are harder to digest and may make you feel sick to your stomach (nauseated).   Avoid alcoholic beverages for 24 hours or as instructed.  MEDICATIONS  You may resume your normal medications unless your doctor tells you otherwise.  WHAT YOU CAN EXPECT TODAY  Some feelings of bloating in the abdomen.   Passage of more gas than usual.   Spotting of blood in your stool or on the toilet paper.  IF YOU HAD POLYPS REMOVED DURING THE COLONOSCOPY:  No aspirin products for 7 days or as instructed.   No alcohol for 7 days or as instructed.   Eat a soft diet for the next 24 hours.  FINDING OUT THE RESULTS OF YOUR TEST Not all test results are available during your visit. If your test results are not back during the visit, make an appointment  with your caregiver to find out the results. Do not assume everything is normal if you have not heard from your caregiver or the medical facility. It is important for you to follow up on all of your test results.  SEEK IMMEDIATE MEDICAL ATTENTION IF:  You have more than a spotting of blood in your stool.   Your belly is swollen (abdominal distention).   You are nauseated or vomiting.   You have a temperature over 101.   You have abdominal pain or discomfort that is severe or gets worse throughout the day.   Hiatal hernia, diverticulosis and colon polyp information provided  Further recommendations to follow pending review of pathology report  Resume Coumadin today  Office visit with [name] in 3 months  I called Dominica Severin at [phone] but could not make contact.   Diverticulosis  Diverticulosis is a condition that develops when small pouches (diverticula) form in the wall of the large intestine (colon). The colon is where water is absorbed and stool is formed. The pouches form when the inside layer of the colon pushes through weak spots in the outer layers of the colon. You may have a few pouches or many of them. What are the causes? The cause of this condition is not known. What increases the risk? The following factors may make you more likely to develop this condition:  Being older than age 58. Your risk for this condition increases with age. Diverticulosis is rare  among people younger than age 66. By age 103, many people have it.  Eating a low-fiber diet.  Having frequent constipation.  Being overweight.  Not getting enough exercise.  Smoking.  Taking over-the-counter pain medicines, like aspirin and ibuprofen.  Having a family history of diverticulosis. What are the signs or symptoms? In most people, there are no symptoms of this condition. If you do have symptoms, they may include:  Bloating.  Cramps in the abdomen.  Constipation or diarrhea.  Pain in the lower left  side of the abdomen. How is this diagnosed? This condition is most often diagnosed during an exam for other colon problems. Because diverticulosis usually has no symptoms, it often cannot be diagnosed independently. This condition may be diagnosed by:  Using a flexible scope to examine the colon (colonoscopy).  Taking an X-ray of the colon after dye has been put into the colon (barium enema).  Doing a CT scan. How is this treated? You may not need treatment for this condition if you have never developed an infection related to diverticulosis. If you have had an infection before, treatment may include:  Eating a high-fiber diet. This may include eating more fruits, vegetables, and grains.  Taking a fiber supplement.  Taking a live bacteria supplement (probiotic).  Taking medicine to relax your colon.  Taking antibiotic medicines. Follow these instructions at home:  Drink 6-8 glasses of water or more each day to prevent constipation.  Try not to strain when you have a bowel movement.  If you have had an infection before: ? Eat more fiber as directed by your health care provider or your diet and nutrition specialist (dietitian). ? Take a fiber supplement or probiotic, if your health care provider approves.  Take over-the-counter and prescription medicines only as told by your health care provider.  If you were prescribed an antibiotic, take it as told by your health care provider. Do not stop taking the antibiotic even if you start to feel better.  Keep all follow-up visits as told by your health care provider. This is important. Contact a health care provider if:  You have pain in your abdomen.  You have bloating.  You have cramps.  You have not had a bowel movement in 3 days. Get help right away if:  Your pain gets worse.  Your bloating becomes very bad.  You have a fever or chills, and your symptoms suddenly get worse.  You vomit.  You have bowel movements that  are bloody or black.  You have bleeding from your rectum. Summary  Diverticulosis is a condition that develops when small pouches (diverticula) form in the wall of the large intestine (colon).  You may have a few pouches or many of them.  This condition is most often diagnosed during an exam for other colon problems.  If you have had an infection related to diverticulosis, treatment may include increasing the fiber in your diet, taking supplements, or taking medicines. This information is not intended to replace advice given to you by your health care provider. Make sure you discuss any questions you have with your health care provider. Document Released: 01/05/2004 Document Revised: 03/22/2017 Document Reviewed: 02/27/2016 Elsevier Patient Education  2020 [employer].  Hiatal Hernia  A hiatal hernia occurs when part of the stomach slides above the muscle that separates the abdomen from the chest (diaphragm). A person can be born with a hiatal hernia (congenital), or it may develop over time. In almost all cases of hiatal  hernia, only the top part of the stomach pushes through the diaphragm. Many people have a hiatal hernia with no symptoms. The larger the hernia, the more likely it is that you will have symptoms. In some cases, a hiatal hernia allows stomach acid to flow back into the tube that carries food from your mouth to your stomach (esophagus). This may cause heartburn symptoms. Severe heartburn symptoms may mean that you have developed a condition called gastroesophageal reflux disease (GERD). What are the causes? This condition is caused by a weakness in the opening (hiatus) where the esophagus passes through the diaphragm to attach to the upper part of the stomach. A person may be born with a weakness in the hiatus, or a weakness can develop over time. What increases the risk? This condition is more likely to develop in:  Older people. Age is a major risk factor for a hiatal  hernia, especially if you are over the age of 11.  Pregnant women.  People who are overweight.  People who have frequent constipation. What are the signs or symptoms? Symptoms of this condition usually develop in the form of GERD symptoms. Symptoms include:  Heartburn.  Belching.  Indigestion.  Trouble swallowing.  Coughing or wheezing.  Sore throat.  Hoarseness.  Chest pain.  Nausea and vomiting. How is this diagnosed? This condition may be diagnosed during testing for GERD. Tests that may be done include:  X-rays of your stomach or chest.  An upper gastrointestinal (GI) series. This is an X-ray exam of your GI tract that is taken after you swallow a chalky liquid that shows up clearly on the X-ray.  Endoscopy. This is a procedure to look into your stomach using a thin, flexible tube that has a tiny camera and light on the end of it. How is this treated? This condition may be treated by:  Dietary and lifestyle changes to help reduce GERD symptoms.  Medicines. These may include: ? Over-the-counter antacids. ? Medicines that make your stomach empty more quickly. ? Medicines that block the production of stomach acid (H2 blockers). ? Stronger medicines to reduce stomach acid (proton pump inhibitors).  Surgery to repair the hernia, if other treatments are not helping. If you have no symptoms, you may not need treatment. Follow these instructions at home: Lifestyle and activity  Do not use any products that contain nicotine or tobacco, such as cigarettes and e-cigarettes. If you need help quitting, ask your health care provider.  Try to achieve and maintain a healthy body weight.  Avoid putting pressure on your abdomen. Anything that puts pressure on your abdomen increases the amount of acid that may be pushed up into your esophagus. ? Avoid bending over, especially after eating. ? Raise the head of your bed by putting blocks under the legs. This keeps your head  and esophagus higher than your stomach. ? Do not wear tight clothing around your chest or stomach. ? Try not to strain when having a bowel movement, when urinating, or when lifting heavy objects. Eating and drinking  Avoid foods that can worsen GERD symptoms. These may include: ? Fatty foods, like fried foods. ? Citrus fruits, like oranges or lemon. ? Other foods and drinks that contain acid, like orange juice or tomatoes. ? Spicy food. ? Chocolate.  Eat frequent small meals instead of three large meals a day. This helps prevent your stomach from getting too full. ? Eat slowly. ? Do not lie down right after eating. ? Do not eat  1-2 hours before bed.  Do not drink beverages with caffeine. These include cola, coffee, cocoa, and tea.  Do not drink alcohol. General instructions  Take over-the-counter and prescription medicines only as told by your health care provider.  Keep all follow-up visits as told by your health care provider. This is important. Contact a health care provider if:  Your symptoms are not controlled with medicines or lifestyle changes.  You are having trouble swallowing.  You have coughing or wheezing that will not go away. Get help right away if:  Your pain is getting worse.  Your pain spreads to your arms, neck, jaw, teeth, or back.  You have shortness of breath.  You sweat for no reason.  You feel sick to your stomach (nauseous) or you vomit.  You vomit blood.  You have bright red blood in your stools.  You have black, tarry stools.    Colon Polyps  Polyps are tissue growths inside the body. Polyps can grow in many places, including the large intestine (colon). A polyp may be a round bump or a mushroom-shaped growth. You could have one polyp or several. Most colon polyps are noncancerous (benign). However, some colon polyps can become cancerous over time. Finding and removing the polyps early can help prevent this. What are the causes? The  exact cause of colon polyps is not known. What increases the risk? You are more likely to develop this condition if you:  Have a family history of colon cancer or colon polyps.  Are older than 1 or older than 45 if you are African American.  Have inflammatory bowel disease, such as ulcerative colitis or Crohn's disease.  Have certain hereditary conditions, such as: ? Familial adenomatous polyposis. ? Lynch syndrome. ? Turcot syndrome. ? Peutz-Jeghers syndrome.  Are overweight.  Smoke cigarettes.  Do not get enough exercise.  Drink too much alcohol.  Eat a diet that is high in fat and red meat and low in fiber.  Had childhood cancer that was treated with abdominal radiation. What are the signs or symptoms? Most polyps do not cause symptoms. If you have symptoms, they may include:  Blood coming from your rectum when having a bowel movement.  Blood in your stool. The stool may look dark red or black.  Abdominal pain.  A change in bowel habits, such as constipation or diarrhea. How is this diagnosed? This condition is diagnosed with a colonoscopy. This is a procedure in which a lighted, flexible scope is inserted into the anus and then passed into the colon to examine the area. Polyps are sometimes found when a colonoscopy is done as part of routine cancer screening tests. How is this treated? Treatment for this condition involves removing any polyps that are found. Most polyps can be removed during a colonoscopy. Those polyps will then be tested for cancer. Additional treatment may be needed depending on the results of testing. Follow these instructions at home: Lifestyle  Maintain a healthy weight, or lose weight if recommended by your health care provider.  Exercise every day or as told by your health care provider.  Do not use any products that contain nicotine or tobacco, such as cigarettes and e-cigarettes. If you need help quitting, ask your health care  provider.  If you drink alcohol, limit how much you have: ? 0-1 drink a day for women. ? 0-2 drinks a day for men.  Be aware of how much alcohol is in your drink. In the [place] one drink equals  one 12 oz bottle of beer (355 mL), one 5 oz glass of wine (148 mL), or one 1 oz shot of hard liquor (44 mL). Eating and drinking   Eat foods that are high in fiber, such as fruits, vegetables, and whole grains.  Eat foods that are high in calcium and vitamin D, such as milk, cheese, yogurt, eggs, liver, fish, and broccoli.  Limit foods that are high in fat, such as fried foods and desserts.  Limit the amount of red meat and processed meat you eat, such as hot dogs, sausage, bacon, and lunch meats. General instructions  Keep all follow-up visits as told by your health care provider. This is important. ? This includes having regularly scheduled colonoscopies. ? Talk to your health care provider about when you need a colonoscopy. Contact a health care provider if:  You have new or worsening bleeding during a bowel movement.  You have new or increased blood in your stool.  You have a change in bowel habits.  You lose weight for no known reason. Summary  Polyps are tissue growths inside the body. Polyps can grow in many places, including the colon.  Most colon polyps are noncancerous (benign), but some can become cancerous over time.  This condition is diagnosed with a colonoscopy.  Treatment for this condition involves removing any polyps that are found. Most polyps can be removed during a colonoscopy. This information is not intended to replace advice given to you by your health care provider. Make sure you discuss any questions you have with your health care provider. Document Released: 01/04/2004 Document Revised: 07/25/2017 Document Reviewed: 07/25/2017 Elsevier Patient Education  [employer].  This information is not intended to replace advice given to you by your health  care provider. Make sure you discuss any questions you have with your health care provider. Document Released: 06/30/2003 Document Revised: 03/22/2017 Document Reviewed: 11/12/2016 Elsevier Patient Education  2020 [employer].

## 2018-11-20 NOTE — Op Note (Signed)
[facility] Patient Name: [name] [name] Procedure Date: 11/20/2018 11:29 AM MRN: [id number] Date of Birth: 08-30-1940 Attending MD: Norvel Richards , MD CSN: [id number] Age: 78 Admit Type: Outpatient Procedure:                Colonoscopy Indications:              High risk colon cancer surveillance: Personal                            history of colonic polyps, High risk colon cancer                            surveillance: Personal history of colon cancer Providers:                Norvel Richards, MD, Gwenlyn Fudge RN, RN,                            Raphael Gibney, Technician Referring MD:              Medicines:                Propofol per Anesthesia Complications:            No immediate complications. Estimated Blood Loss:     Estimated blood loss was minimal. Procedure:                Pre-Anesthesia Assessment:                           - Prior to the procedure, a History and Physical                            was performed, and patient medications and                            allergies were reviewed. The patient's tolerance of                            previous anesthesia was also reviewed. The risks                            and benefits of the procedure and the sedation                            options and risks were discussed with the patient.                            All questions were answered, and informed consent                            was obtained. Prior Anticoagulants: The patient                            last took Coumadin (warfarin) 4 days prior to the  procedure. ASA Grade Assessment: III - A patient                            with severe systemic disease. After reviewing the                            risks and benefits, the patient was deemed in                            satisfactory condition to undergo the procedure.                           After obtaining informed consent, the colonoscope                             was passed under direct vision. Throughout the                            procedure, the patient's blood pressure, pulse, and                            oxygen saturations were monitored continuously. The                            CF-[id number] ([id number]) scope was introduced through                            the anus and advanced to the the cecum, identified                            by appendiceal orifice and ileocecal valve. The                            colonoscopy was performed without difficulty. The                            patient tolerated the procedure well. The quality                            of the bowel preparation was adequate. Scope In: 11:33:58 AM Scope Out: 12:01:32 PM Scope Withdrawal Time: 0 hours 6 minutes 26 seconds  Total Procedure Duration: 0 hours 27 minutes 34 seconds  Findings:      The perianal and digital rectal examinations were normal.      Surgical anastomosis at 12 cm from the anal verge.      A 5 mm polyp was found just proximal to the anastomosis. The polyp was       semi-pedunculated. The polyp was removed with a cold snare. Resection       and retrieval were complete. Estimated blood loss was minimal.      The exam was otherwise without abnormality on direct and retroflexion       views.      Multiple large-mouthed diverticula were found in the entire colon. Impression:               -  One 5 mm polyp at the anastomosis, removed with a                            cold snare. Resected and retrieved.                           - The examination was otherwise normal on direct                            and retroflexion views.                           - Diverticulosis in the entire examined colon. Moderate Sedation:      Moderate (conscious) sedation was personally administered by an       anesthesia professional. The following parameters were monitored: oxygen       saturation, heart rate, blood pressure, respiratory rate, EKG, adequacy        of pulmonary ventilation, and response to care. Recommendation:           - Patient has a contact number available for                            emergencies. The signs and symptoms of potential                            delayed complications were discussed with the                            patient. Return to normal activities tomorrow.                            Written discharge instructions were provided to the                            patient.                           - Resume previous diet.                           - Continue present medications.                           - No repeat colonoscopy due to age.                           - Return to GI clinic in 3 months. follow-up on                            pathology. See EGD report. Procedure Code(s):        --- Professional ---                           [phone], Colonoscopy, flexible; with removal of  tumor(s), polyp(s), or other lesion(s) by snare                            technique Diagnosis Code(s):        --- Professional ---                           Z86.010, Personal history of colonic polyps                           Z85.038, Personal history of other malignant                            neoplasm of large intestine                           K63.5, Polyp of colon                           K57.30, Diverticulosis of large intestine without                            perforation or abscess without bleeding CPT copyright 2019 [employer]. All rights reserved. The codes documented in this report are preliminary and upon coder review may  be revised to meet current compliance requirements. Cristopher Estimable. [name], MD Norvel Richards, MD 11/20/2018 12:17:25 PM This report has been signed electronically. Number of Addenda: 0

## 2018-11-20 NOTE — Transfer of Care (Signed)
Immediate Anesthesia Transfer of Care Note  Patient: [name] [name]  Procedure(s) Performed: COLONOSCOPY WITH PROPOFOL (N/A ) ESOPHAGOGASTRODUODENOSCOPY (EGD) WITH PROPOFOL (N/A ) POLYPECTOMY  Patient Location: PACU  Anesthesia Type:General  Level of Consciousness: awake, alert  and patient cooperative  Airway & Oxygen Therapy: Patient Spontanous Breathing  Post-op Assessment: Report given to RN and Post -op Vital signs reviewed and stable  Post vital signs: Reviewed and stable  Last Vitals:  Vitals Value Taken Time  BP 116/61 11/20/18 1208  Temp    Pulse 73 11/20/18 1209  Resp 23 11/20/18 1209  SpO2 99 % 11/20/18 1209  Vitals shown include unvalidated device data.  Last Pain:  Vitals:   11/20/18 1123  TempSrc:   PainSc: 0-No pain      Patients Stated Pain Goal: 8 (60/45/40 9811)  Complications: No apparent anesthesia complications

## 2018-11-20 NOTE — Op Note (Signed)
[facility] Patient Name: [name] [name] Procedure Date: 11/20/2018 11:13 AM MRN: [id number] Date of Birth: 29-Jul-1940 Attending MD: Norvel Richards , MD CSN: [id number] Age: 78 Admit Type: Outpatient Procedure:                Upper GI endoscopy Indications:              Abnormal CT of the GI tract; abdominal pain Providers:                Norvel Richards, MD, Otis Peak B. Sharon Seller, RN,                            Raphael Gibney, Technician Referring MD:             Glenda Chroman Medicines:                Propofol per Anesthesia Complications:            No immediate complications. Estimated Blood Loss:     Estimated blood loss: none. Procedure:                Pre-Anesthesia Assessment:                           - Prior to the procedure, a History and Physical                            was performed, and patient medications and                            allergies were reviewed. The patient's tolerance of                            previous anesthesia was also reviewed. The risks                            and benefits of the procedure and the sedation                            options and risks were discussed with the patient.                            All questions were answered, and informed consent                            was obtained. Prior Anticoagulants: The patient                            last took Coumadin (warfarin) 4 days prior to the                            procedure. ASA Grade Assessment: III - A patient                            with severe systemic disease. After reviewing the  risks and benefits, the patient was deemed in                            satisfactory condition to undergo the procedure.                           After obtaining informed consent, the endoscope was                            passed under direct vision. Throughout the                            procedure, the patient's blood pressure, pulse,  and                            oxygen saturations were monitored continuously. The                            GIF-H190 ([id number]) scope was introduced through the                            mouth, and advanced to the second part of duodenum.                            The upper GI endoscopy was accomplished without                            difficulty. The patient tolerated the procedure                            well. Scope In: 11:25:19 AM Scope Out: 11:28:34 AM Total Procedure Duration: 0 hours 3 minutes 15 seconds  Findings:      The examined esophagus was normal.      A large hiatal hernia was present.      The exam was otherwise without abnormality.      The duodenal bulb and second portion of the duodenum were normal. Impression:               - Normal esophagus (tortuous)                           - Large hiatal hernia. Stomach appeared otherwise                            normal. Suspect artifact on CT                           - The examination was otherwise normal.                           - Normal duodenal bulb and second portion of the                            duodenum.                           -  No specimens collected. I suspect patient is                            having some symptoms from time to time related to                            large hiatal hernia. No acid suppression therapy. Moderate Sedation:      Moderate (conscious) sedation was personally administered by an       anesthesia professional. The following parameters were monitored: oxygen       saturation, heart rate, blood pressure, respiratory rate, EKG, adequacy       of pulmonary ventilation, and response to care. Recommendation:           - Patient has a contact number available for                            emergencies. The signs and symptoms of potential                            delayed complications were discussed with the                            patient. Return to normal activities  tomorrow.                            Written discharge instructions were provided to the                            patient.                           - Resume previous diet.                           - Continue present medications. Trial of Protonix                            40 mg daily. Office visit in 2 months. See                            colonoscopy report                           - Procedure Code(s):        --- Professional ---                           [phone], Esophagogastroduodenoscopy, flexible,                            transoral; diagnostic, including collection of                            specimen(s) by brushing or washing, when performed                            (  separate procedure) Diagnosis Code(s):        --- Professional ---                           K44.9, Diaphragmatic hernia without obstruction or                            gangrene                           R93.3, Abnormal findings on diagnostic imaging of                            other parts of digestive tract CPT copyright 2019 [employer]. All rights reserved. The codes documented in this report are preliminary and upon coder review may  be revised to meet current compliance requirements. Cristopher Estimable. Rourk, MD Norvel Richards, MD 11/20/2018 12:13:16 PM This report has been signed electronically. Number of Addenda: 0

## 2018-11-20 NOTE — Anesthesia Procedure Notes (Signed)
Procedure Name: MAC Date/Time: 11/20/2018 11:18 AM Performed by: Vista Deck, CRNA Pre-anesthesia Checklist: Patient identified, Emergency Drugs available, Suction available, Timeout performed and Patient being monitored Patient Re-evaluated:Patient Re-evaluated prior to induction Oxygen Delivery Method: Nasal Cannula

## 2018-11-20 NOTE — Interval H&P Note (Signed)
History and Physical Interval Note:  11/20/2018 11:16 AM  [name] [name]  has presented today for surgery, with the diagnosis of hx polyps, weight loss, hemorrhoids, lower abd pain, gastric wall thickening.  The various methods of treatment have been discussed with the patient and family. After consideration of risks, benefits and other options for treatment, the patient has consented to  Procedure(s) with comments: COLONOSCOPY WITH PROPOFOL (N/A) - 2:30pm ESOPHAGOGASTRODUODENOSCOPY (EGD) WITH PROPOFOL (N/A) as a surgical intervention.  The patient's history has been reviewed, patient examined, no change in status, stable for surgery.  I have reviewed the patient's chart and labs.  Questions were answered to the patient's satisfaction.     Manus Rudd  Currently patient having no abdominal pain.  Denies dysphagia.  EGD and colonoscopy today per plan.  Last Coumadin 3 days ago.  The risks, benefits, limitations, imponderables and alternatives regarding both EGD and colonoscopy have been reviewed with the patient. Questions have been answered. All parties agreeable.

## 2018-11-20 NOTE — Anesthesia Preprocedure Evaluation (Signed)
Anesthesia Evaluation  Patient identified by MRN, date of birth, ID band Patient awake  General Assessment Comment:H/o PONV once over 20 years ago -no problems recently   Reviewed: Allergy & Precautions, NPO status , Patient's Chart, lab work & pertinent test results  History of Anesthesia Complications (+) PONV and history of anesthetic complications  Airway Mallampati: II  TM Distance: >3 FB Neck ROM: Full    Dental no notable dental hx. (+) Teeth Intact   Pulmonary shortness of breath and with exertion, asthma , COPD,  COPD inhaler,  Known pulm nodule -watching  Uses inhalers but none recently    Pulmonary exam normal breath sounds clear to auscultation       Cardiovascular Exercise Tolerance: Good +CHF  Normal cardiovascular exam+ dysrhythmias Atrial Fibrillation I Rhythm:Regular Rate:Normal  S/p MVR with Maze -EF normal in 2018  Had one DC CV 08/16/17 without issues since  Last ECG SB , HR ~80 today  States CHF resolved    Neuro/Psych  Headaches, PSYCHIATRIC DISORDERS Anxiety Depression  Neuromuscular disease    GI/Hepatic Neg liver ROS, hiatal hernia, GERD  Medicated and Controlled,  Endo/Other  neg diabetesHypothyroidism   Renal/GU negative Renal ROS  negative genitourinary   Musculoskeletal  (+) Arthritis , Osteoarthritis,    Abdominal   Peds negative pediatric ROS (+)  Hematology negative hematology ROS (+)   Anesthesia Other Findings   Reproductive/Obstetrics negative OB ROS                             Anesthesia Physical Anesthesia Plan  ASA: III  Anesthesia Plan: General   Post-op Pain Management:    Induction: Intravenous  PONV Risk Score and Plan: 4 or greater and Treatment may vary due to age or medical condition, Ondansetron, TIVA and Propofol infusion  Airway Management Planned: Simple Face Mask and Nasal Cannula  Additional Equipment:   Intra-op Plan:    Post-operative Plan:   Informed Consent: I have reviewed the patients History and Physical, chart, labs and discussed the procedure including the risks, benefits and alternatives for the proposed anesthesia with the patient or authorized representative who has indicated his/her understanding and acceptance.     Dental advisory given  Plan Discussed with: CRNA  Anesthesia Plan Comments: (Plan Full PPE  Plan Ga with GETA as needed -d/w pt -WTP with same after Q&A)        Anesthesia Quick Evaluation

## 2018-11-21 ENCOUNTER — Encounter: Payer: Self-pay | Admitting: Internal Medicine

## 2018-11-21 ENCOUNTER — Telehealth: Payer: Self-pay | Admitting: "Endocrinology

## 2018-11-21 NOTE — Telephone Encounter (Signed)
Patient said her PCP did blood work at her yearly physical and ended up changing her levothyroxine to 45mcg. She said that you have her on 24mcg. She would like to know which she needs to be taking?

## 2018-11-21 NOTE — Telephone Encounter (Signed)
[place] , Would you change her appointment to be 4 weeks earlier with labs ,  than previously set? Thank you.

## 2018-11-24 NOTE — Telephone Encounter (Signed)
Done

## 2018-11-25 ENCOUNTER — Telehealth: Payer: Self-pay | Admitting: Internal Medicine

## 2018-11-25 NOTE — Telephone Encounter (Signed)
[phone] please call patient-had colonoscopy last week and has not had a bowel movement since,  Wants to know what she can take

## 2018-11-25 NOTE — Telephone Encounter (Signed)
Pt had a TCS on 11/20/2018. Pt states that she hasn't had a bowel movement since then and doesn't know what she should take to make her bowels move. Pt took one Doculax yesterday with no changes in her bowels. Pt had a little abdominal pain yesterday that has subsided.

## 2018-11-27 NOTE — Telephone Encounter (Signed)
Let's have her do a MiraLAX purge if she still hasn't had a good bowel movement.  For Miralax purge: Take 17 gm (1 capful or 1 packet) of Miralax mixed into water, Gatorade, or Powerade followed by 8 ounces of plain water. Do this once an hour until you have a good bowel movement, but no more than 5 doses.  If needed she can pick up samples of MiraLAX (if we have them)  Call with any questions or problems.

## 2018-11-27 NOTE — Telephone Encounter (Signed)
Pt notified of EG's recommendations. Pt reported that she had a good bowel movement since talking to me. She will call back if she has problems with constipation. Miralax Purge instructions given to pt to use if needed.

## 2018-12-01 ENCOUNTER — Other Ambulatory Visit: Payer: Self-pay

## 2018-12-01 ENCOUNTER — Ambulatory Visit (INDEPENDENT_AMBULATORY_CARE_PROVIDER_SITE_OTHER): Payer: Medicare Other | Admitting: *Deleted

## 2018-12-01 DIAGNOSIS — I4819 Other persistent atrial fibrillation: Secondary | ICD-10-CM | POA: Diagnosis not present

## 2018-12-01 DIAGNOSIS — Z5181 Encounter for therapeutic drug level monitoring: Secondary | ICD-10-CM

## 2018-12-01 DIAGNOSIS — Z9889 Other specified postprocedural states: Secondary | ICD-10-CM | POA: Diagnosis not present

## 2018-12-01 LAB — POCT INR: INR: 2.5 (ref 2.0–3.0)

## 2018-12-01 NOTE — Patient Instructions (Signed)
Continue coumadin 2 tablets daily except 3 tablets on Tuesdays and Fridays Recheck in 4 weeks

## 2018-12-02 ENCOUNTER — Encounter (HOSPITAL_COMMUNITY): Payer: Self-pay | Admitting: Internal Medicine

## 2018-12-02 DIAGNOSIS — M159 Polyosteoarthritis, unspecified: Secondary | ICD-10-CM | POA: Diagnosis not present

## 2018-12-02 DIAGNOSIS — J449 Chronic obstructive pulmonary disease, unspecified: Secondary | ICD-10-CM | POA: Diagnosis not present

## 2018-12-02 DIAGNOSIS — E7800 Pure hypercholesterolemia, unspecified: Secondary | ICD-10-CM | POA: Diagnosis not present

## 2018-12-02 DIAGNOSIS — I4891 Unspecified atrial fibrillation: Secondary | ICD-10-CM | POA: Diagnosis not present

## 2018-12-09 DIAGNOSIS — E8940 Asymptomatic postprocedural ovarian failure: Secondary | ICD-10-CM | POA: Diagnosis not present

## 2018-12-16 ENCOUNTER — Other Ambulatory Visit: Payer: Self-pay | Admitting: *Deleted

## 2018-12-16 NOTE — Telephone Encounter (Signed)
Received refill request for pantoprazole sodium 40mg  1 tab po QD

## 2018-12-17 NOTE — Telephone Encounter (Signed)
It looks like she hasn't been on this for at least a couple years. Is she having GERD symptoms? She didn't mention any at her last visit.

## 2018-12-17 NOTE — Telephone Encounter (Signed)
Called patient. She was prescribed this medication from [facility] when she had her procedure done in 10/2018 and she has been taking this since. Looked at note and he stated trial of pant 40mg  QD and return to office in 2 months

## 2018-12-19 ENCOUNTER — Telehealth: Payer: Self-pay | Admitting: *Deleted

## 2018-12-19 NOTE — Telephone Encounter (Signed)
Patient verbally consented for tele-health visits with [facility] and understands that her insurance company will be billed for the encounter.  Aware to have vitals available   

## 2018-12-23 ENCOUNTER — Encounter: Payer: Self-pay | Admitting: Cardiology

## 2018-12-23 ENCOUNTER — Telehealth (INDEPENDENT_AMBULATORY_CARE_PROVIDER_SITE_OTHER): Payer: Medicare Other | Admitting: Cardiology

## 2018-12-23 VITALS — BP 134/83 | HR 76 | Ht 61.0 in | Wt 120.0 lb

## 2018-12-23 DIAGNOSIS — I480 Paroxysmal atrial fibrillation: Secondary | ICD-10-CM

## 2018-12-23 DIAGNOSIS — R0789 Other chest pain: Secondary | ICD-10-CM | POA: Diagnosis not present

## 2018-12-23 NOTE — Progress Notes (Signed)
Virtual Visit via Telephone Note   This visit type was conducted due to national recommendations for restrictions regarding the COVID-19 Pandemic (e.g. social distancing) in an effort to limit this patient's exposure and mitigate transmission in our community.  Due to her co-morbid illnesses, this patient is at least at moderate risk for complications without adequate follow up.  This format is felt to be most appropriate for this patient at this time.  The patient did not have access to video technology/had technical difficulties with video requiring transitioning to audio format only (telephone).  All issues noted in this document were discussed and addressed.  No physical exam could be performed with this format.  Please refer to the patient's chart for her  consent to telehealth for [facility].   Date:  12/23/2018   ID:  [name] [name], DOB April 21, 1941, MRN [id number]  Patient Location: Home Provider Location: Office  PCP:  Glenda Chroman, MD  Cardiologist:  Carlyle Dolly, MD  Electrophysiologist:  Cristopher Peru, MD   Evaluation Performed:  Follow-Up Visit  Chief Complaint:  Follow up  History of Present Illness:    [name] [name] is a 78 y.o. female seen today for a visit on recent atypical chest pain.   1. Chest pain - 2017 cath minimal CAD, distal LAD bridging  - sharp pain midchest, mild pain. Can occur at rest or with exertion. Not positional. Lasts just a few seconds. No other associated symptoms. No association with eating. Last episode about 1 month ago.     2.Paroxysmal afib/ Aflutter - s/p MAZE procedure during recent MV repair   - palpitations 2-3 times a week, lasts a few hours. Overal mild in severity - HRs normall around 70s-90s  3 Sinus node dysfunction - followed by EP, watchful waiting. Likely will require ppm at some point       The patient does not have symptoms concerning for COVID-19 infection (fever, chills, cough, or new  shortness of breath).    Past Medical History:  Diagnosis Date  . Anxiety   . Arthritis   . Asthma   . Atrial fibrillation, persistent   . Chronic diastolic congestive heart failure ([place])   . Colon adenoma   . Colon cancer ([place])    status post low anterior resection, limited stage disease requiring no adjuvant therapy  . COPD (chronic obstructive pulmonary disease) ([place])   . Depression   . Diverticulosis   . DM (dermatomyositis)   . DVT (deep venous thrombosis) ([name])    in leg- long time ago  . Dyspnea    with activity  . Dysrhythmia   . Esophageal dysphagia   . GERD (gastroesophageal reflux disease)   . Headache   . Heart murmur   . Hematuria   . Hemorrhoids   . Hiatal hernia   . History of kidney stones    x 2  . Hypercholesterolemia   . Incidental pulmonary nodule 05/08/2016   8 mm vague opacity RML noted on CT scan  . Mitral regurgitation   . PONV (postoperative nausea and vomiting) 2003 ish    with breast biopsy  . S/P minimally invasive maze operation for atrial fibrillation 07/05/2016   Complete bilateral atrial lesion set using cryothermy and bipolar radiofrequency ablation with clipping of LA appendage via right mini thoracotomy approach  . 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/05/2016   Complex valvuloplasty including artificial Gore-tex neochord placement x6 and 30 mm Sorin Memo 3D ring annuloplasty via right  mini thoracotomy approach  . Schatzki's ring   . Tricuspid regurgitation    Past Surgical History:  Procedure Laterality Date  . APPENDECTOMY    . BREAST SURGERY Right 2003ish   biopsy  . CARDIAC CATHETERIZATION N/A 04/12/2016   Procedure: Right/Left Heart Cath and Coronary Angiography;  Surgeon: Leonie Man, MD;  Location: [facility];  Service: Cardiovascular;  Laterality: N/A;  . CARDIOVERSION N/A 08/16/2017   Procedure: CARDIOVERSION;  Surgeon: Arnoldo Lenis, MD;  Location: [name];  Service: Endoscopy;  Laterality: N/A;  .  CATARACT EXTRACTION Bilateral 2017  . CLIPPING OF ATRIAL APPENDAGE  07/05/2016   Procedure: CLIPPING OF ATRIAL APPENDAGE;  Surgeon: Rexene Alberts, MD;  Location: [place];  Service: Open Heart Surgery;;  . COLONOSCOPY  11/09   Dr. Gala Romney- external hemorrhoidal tags o/w normal rectal mucosa, s/p surgical resection with a normal appearing anastomosis 12cm, pan colonic diverticulum  . COLONOSCOPY N/A 06/02/2012   [id number] post low anterior resection. Pancolonic diverticulosis. Colonic polyp-tubular adenoma. Surveillance due 2019.   Marland Kitchen COLONOSCOPY N/A 10/13/2015   Procedure: COLONOSCOPY;  Surgeon: Daneil Dolin, MD;  [name];  Service: Endoscopy;  Laterality: N/A;  0930  . COLONOSCOPY WITH PROPOFOL N/A 11/20/2018   Procedure: COLONOSCOPY WITH PROPOFOL;  Surgeon: Daneil Dolin, MD;  [name];  Service: Endoscopy;  Laterality: N/A;  2:30pm  . ESOPHAGOGASTRODUODENOSCOPY  07/2007   Dr. Daiva Nakayama cervical esophageal web, schatzki ring, large hiatal hernia  . ESOPHAGOGASTRODUODENOSCOPY (EGD) WITH PROPOFOL N/A 11/20/2018   Procedure: ESOPHAGOGASTRODUODENOSCOPY (EGD) WITH PROPOFOL;  Surgeon: Daneil Dolin, MD;  [name];  Service: Endoscopy;  Laterality: N/A;  . INGUNAL HERNIA REPAIR Left   . LOW ANTERIOR BOWEL RESECTION     NO ADJ CHEMO  . MINIMALLY INVASIVE MAZE PROCEDURE N/A 07/05/2016   Procedure: MINIMALLY INVASIVE MAZE PROCEDURE;  Surgeon: Rexene Alberts, MD;  Location: [place];  Service: Open Heart Surgery;  Laterality: N/A;  . MITRAL VALVE REPAIR Right 07/05/2016   Procedure: MINIMALLY INVASIVE MITRAL VALVE REPAIR (MVR);  Surgeon: Rexene Alberts, MD;  Location: [place];  Service: Open Heart Surgery;  Laterality: Right;  . MULTIPLE EXTRACTIONS WITH ALVEOLOPLASTY N/A 05/21/2016   Procedure: MULTIPLE EXTRACTION OF TOOTH #'S 5, 21 WITH ALVEOLOPLASTY AND GROSS DEBRIDEMENT OF TEETH;  Surgeon: Lenn Cal, DDS;  Location: [place];  Service: Oral Surgery;   Laterality: N/A;  . POLYPECTOMY  11/20/2018   Procedure: POLYPECTOMY;  Surgeon: Daneil Dolin, MD;  [name];  Service: Endoscopy;;  colon  . TEE WITHOUT CARDIOVERSION N/A 02/20/2016   Procedure: TRANSESOPHAGEAL ECHOCARDIOGRAM (TEE);  Surgeon: Arnoldo Lenis, MD;  [name];  Service: Endoscopy;  Laterality: N/A;  . TEE WITHOUT CARDIOVERSION N/A 07/05/2016   Procedure: TRANSESOPHAGEAL ECHOCARDIOGRAM (TEE);  Surgeon: Rexene Alberts, MD;  Location: [place];  Service: Open Heart Surgery;  Laterality: N/A;  . TOOTH EXTRACTION    . TUBAL LIGATION    . VEIN LIGATION AND STRIPPING       No outpatient medications have been marked as taking for the 12/23/18 encounter (Appointment) with Arnoldo Lenis, MD.     Allergies:   Iohexol, Hydrocodone-acetaminophen, Nabumetone, and Prednisone   Social History   Tobacco Use  . Smoking status: Never Smoker  . Smokeless tobacco: Never Used  Substance Use Topics  . Alcohol use: No    Alcohol/week: 0.0 standard drinks  . Drug use: No  Family Hx: The patient's family history includes Aneurysm in her brother; Brain cancer in her sister; Breast cancer in her mother; Cancer - Lung in her sister; Prostate cancer in her brother; Rheum arthritis in her father. There is no history of Colon cancer.  ROS:   Please see the history of present illness.     All other systems reviewed and are negative.   Prior CV studies:   The following studies were reviewed today:  01/2016 echo Study Conclusions  - Left ventricle: The cavity size was normal. Wall thickness was normal. Systolic function was normal. The estimated ejection fraction was in the range of 60% to 65%. Wall motion was normal; there were no regional wall motion abnormalities. The study is not technically sufficient to allow evaluation of LV diastolic function. Doppler parameters are consistent with high ventricular filling pressure. - Mitral  valve: Calcified annulus. There was moderate to severe (closer to severe) eccentric regurgitation. - Left atrium: The atrium was severely dilated. - Right atrium: The atrium was moderately dilated. - Tricuspid valve: There was moderate-severe regurgitation. - Pulmonary arteries: PA peak pressure: 40 mm Hg (S).   01/2016 TEE eccentric moderate to severe MR. Moderate to severe TR. F/u full report for final findings.  08/22/16 echo  Study Conclusions  - Left ventricle: The cavity size was normal. Wall thickness was normal. Systolic function was normal. The estimated ejection fraction was in the range of 60% to 65%. Wall motion was normal; there were no regional wall motion abnormalities. The study is not technically sufficient to allow evaluation of LV diastolic function. - Aortic valve: Mildly calcified annulus. Trileaflet. Valve area (VTI): 1.6 cm^2. Valve area (Vmax): 1.59 cm^2. Valve area (Vmean): 1.61 cm^2. - Mitral valve: Status post complex valvuloplasty including artificial Gore-tex neochord placement and Sorin Memo 3D Ring Annuloplasty (size 39mm, catalog #[id number], serial [id number]). Mildly thickened leaflets. There was trivial regurgitation. Mean gradient (D): 4 mm Hg. - Left atrium: The atrium was mildly to moderately dilated. - Right ventricle: The cavity size was mildly dilated. - Right atrium: The atrium was at the upper limits of normal in size. Central venous pressure (est): 3 mm Hg. - Atrial septum: No defect or patent foramen ovale was identified. - Tricuspid valve: There was moderate regurgitation. - Pulmonary arteries: Systolic pressure was moderately increased. PA peak pressure: 50 mm Hg (S). - Pericardium, extracardiac: There was no pericardial effusion.  Impressions:  - Normal LV wall thickness with LVEF 60-65%. Indeterminate diastolic function. Mild to moderate left atrial enlargement. Status post mitral valve repair and  annuloplasty as indicated with mildly thickened leaflets and trivial mitral regurgitation. Mean gradient 4 mmHg. Mild right ventricular enlargement. At least moderate tricuspid regurgitation noted. Moderate pulmonary hypertension with PASP 50 mmHg.   10/2016 echo Study Conclusions  - Left ventricle: The cavity size was normal. Wall thickness was normal. Systolic function was normal. The estimated ejection fraction was in the range of 60% to 65%. Wall motion was normal; there were no regional wall motion abnormalities. The study is not technically sufficient to allow evaluation of LV diastolic function. - Aortic valve: Moderately calcified annulus. Trileaflet. - Mitral valve: Status post complex valvuloplasty including artificial Gore-tex neochord placement and Sorin Memo 3D Ring Annuloplasty (size 33mm, catalog #[id number], serial [id number]). There was trivial regurgitation. Mean gradient (D): 4 mm Hg. - Left atrium: The atrium was mildly dilated. - Right ventricle: The cavity size was mildly dilated. Systolic function appears mildly reduced. - Right atrium: The atrium was  mildly to moderately dilated. - Tricuspid valve: There was moderate regurgitation. - Pulmonary arteries: PA peak pressure: 43 mm Hg (S).   Labs/Other Tests and Data Reviewed:    EKG:  No ECG reviewed.  Recent Labs: 09/29/2018: TSH 0.084   Recent Lipid Panel No results found for: CHOL, TRIG, HDL, CHOLHDL, LDLCALC, LDLDIRECT  Wt Readings from Last 3 Encounters:  11/20/18 121 lb (54.9 kg)  10/31/18 123 lb 6.4 oz (56 kg)  10/08/18 121 lb (54.9 kg)     Objective:    Vital Signs:   Today's Vitals   12/23/18 1042  BP: 134/83  Pulse: 76  Weight: 120 lb (54.4 kg)  Height: 5\' 1"  (1.549 m)   Body mass index is 22.67 kg/m.  Normal affect. Normal speech pattern and tone. Comfortable, no apparent distress.   ASSESSMENT & PLAN:    1. Atypical chest pain - symptoms atypical, not  conistent with cardiac ischemia. - continue to monitor at this time, would not plan for ischemic evaluation unless significant change in symptoms.    2. AFib/Aflutter - recent palpitations, overall mild - management limited due to sinus node dysfunction, prior bradycardia on amio. May need pacemaker at some point, followed by ep - currently mild symptoms, heart rates reasonable.   COVID-19 Education: The signs and symptoms of COVID-19 were discussed with the patient and how to seek care for testing (follow up with PCP or arrange E-visit).  The importance of social distancing was discussed today.  Time:   Today, I have spent 14 minutes with the patient with telehealth technology discussing the above problems.     Medication Adjustments/Labs and Tests Ordered: Current medicines are reviewed at length with the patient today.  Concerns regarding medicines are outlined above.   Tests Ordered: No orders of the defined types were placed in this encounter.   Medication Changes: No orders of the defined types were placed in this encounter.   Follow Up:  In Person in 4 month(s)  Signed, Carlyle Dolly, MD  12/23/2018 10:42 AM    [place]

## 2018-12-23 NOTE — Patient Instructions (Signed)
Your physician recommends that you schedule a follow-up appointment in: 4 MONTHS WITH DR BRANCH  Your physician recommends that you continue on your current medications as directed. Please refer to the Current Medication list given to you today.  Thank you for choosing [facility]!!    

## 2018-12-24 DIAGNOSIS — Z6823 Body mass index (BMI) 23.0-23.9, adult: Secondary | ICD-10-CM | POA: Diagnosis not present

## 2018-12-24 DIAGNOSIS — J449 Chronic obstructive pulmonary disease, unspecified: Secondary | ICD-10-CM | POA: Diagnosis not present

## 2018-12-24 DIAGNOSIS — Z299 Encounter for prophylactic measures, unspecified: Secondary | ICD-10-CM | POA: Diagnosis not present

## 2018-12-24 DIAGNOSIS — E785 Hyperlipidemia, unspecified: Secondary | ICD-10-CM | POA: Diagnosis not present

## 2018-12-24 DIAGNOSIS — R413 Other amnesia: Secondary | ICD-10-CM | POA: Diagnosis not present

## 2018-12-24 DIAGNOSIS — I4891 Unspecified atrial fibrillation: Secondary | ICD-10-CM | POA: Diagnosis not present

## 2018-12-24 DIAGNOSIS — M810 Age-related osteoporosis without current pathological fracture: Secondary | ICD-10-CM | POA: Diagnosis not present

## 2019-01-01 DIAGNOSIS — R413 Other amnesia: Secondary | ICD-10-CM | POA: Diagnosis not present

## 2019-01-01 DIAGNOSIS — M810 Age-related osteoporosis without current pathological fracture: Secondary | ICD-10-CM | POA: Diagnosis not present

## 2019-01-02 ENCOUNTER — Other Ambulatory Visit: Payer: Self-pay

## 2019-01-02 ENCOUNTER — Ambulatory Visit (INDEPENDENT_AMBULATORY_CARE_PROVIDER_SITE_OTHER): Payer: Medicare Other | Admitting: *Deleted

## 2019-01-02 DIAGNOSIS — Z5181 Encounter for therapeutic drug level monitoring: Secondary | ICD-10-CM | POA: Diagnosis not present

## 2019-01-02 DIAGNOSIS — Z9889 Other specified postprocedural states: Secondary | ICD-10-CM

## 2019-01-02 DIAGNOSIS — I4819 Other persistent atrial fibrillation: Secondary | ICD-10-CM

## 2019-01-02 LAB — POCT INR: INR: 2.2 (ref 2.0–3.0)

## 2019-01-02 NOTE — Patient Instructions (Signed)
Continue coumadin 2 tablets daily except 3 tablets on Tuesdays and Fridays Recheck in 6 weeks

## 2019-01-05 DIAGNOSIS — E7800 Pure hypercholesterolemia, unspecified: Secondary | ICD-10-CM | POA: Diagnosis not present

## 2019-01-05 DIAGNOSIS — J449 Chronic obstructive pulmonary disease, unspecified: Secondary | ICD-10-CM | POA: Diagnosis not present

## 2019-01-05 DIAGNOSIS — I4891 Unspecified atrial fibrillation: Secondary | ICD-10-CM | POA: Diagnosis not present

## 2019-01-05 DIAGNOSIS — M159 Polyosteoarthritis, unspecified: Secondary | ICD-10-CM | POA: Diagnosis not present

## 2019-01-07 ENCOUNTER — Other Ambulatory Visit (HOSPITAL_COMMUNITY)
Admission: RE | Admit: 2019-01-07 | Discharge: 2019-01-07 | Disposition: A | Discharge status: Home / Self Care | Payer: Medicare Other | Source: Ambulatory Visit | Attending: "Endocrinology | Admitting: "Endocrinology

## 2019-01-07 DIAGNOSIS — E032 Hypothyroidism due to medicaments and other exogenous substances: Secondary | ICD-10-CM | POA: Insufficient documentation

## 2019-01-07 LAB — T4, FREE: Free T4: 1.09 ng/dL (ref 0.61–1.12)

## 2019-01-07 LAB — TSH: TSH: 2.146 u[IU]/mL (ref 0.350–4.500)

## 2019-01-14 ENCOUNTER — Other Ambulatory Visit: Payer: Self-pay

## 2019-01-14 ENCOUNTER — Encounter: Payer: Self-pay | Admitting: "Endocrinology

## 2019-01-14 ENCOUNTER — Ambulatory Visit (INDEPENDENT_AMBULATORY_CARE_PROVIDER_SITE_OTHER): Payer: Medicare Other | Admitting: "Endocrinology

## 2019-01-14 DIAGNOSIS — E032 Hypothyroidism due to medicaments and other exogenous substances: Secondary | ICD-10-CM | POA: Diagnosis not present

## 2019-01-14 NOTE — Progress Notes (Signed)
01/14/2019, 5:42 PM                                Endocrinology Telehealth Visit Follow up Note -During COVID -19 Pandemic  I connected with [name] [name] on 01/14/2019   by telephone and verified that I am speaking with the correct person using two identifiers. [name] [name], [birth year]. she has verbally consented to this visit. All issues noted in this document were discussed and addressed. The format was not optimal for physical exam.   Subjective:    Patient ID: [name] [name], female    DOB: 1940-08-13, PCP Glenda Chroman, MD   Past Medical History:  Diagnosis Date  . Anxiety   . Arthritis   . Asthma   . Atrial fibrillation, persistent   . Chronic diastolic congestive heart failure ([place])   . Colon adenoma   . Colon cancer ([place])    status post low anterior resection, limited stage disease requiring no adjuvant therapy  . COPD (chronic obstructive pulmonary disease) ([place])   . Depression   . Diverticulosis   . DM (dermatomyositis)   . DVT (deep venous thrombosis) ([name])    in leg- long time ago  . Dyspnea    with activity  . Dysrhythmia   . Esophageal dysphagia   . GERD (gastroesophageal reflux disease)   . Headache   . Heart murmur   . Hematuria   . Hemorrhoids   . Hiatal hernia   . History of kidney stones    x 2  . Hypercholesterolemia   . Incidental pulmonary nodule 05/08/2016   8 mm vague opacity RML noted on CT scan  . Mitral regurgitation   . PONV (postoperative nausea and vomiting) 2003 ish    with breast biopsy  . S/P minimally invasive maze operation for atrial fibrillation 07/05/2016   Complete bilateral atrial lesion set using cryothermy and bipolar radiofrequency ablation with clipping of LA appendage via right mini thoracotomy approach  . 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/05/2016   Complex valvuloplasty including artificial Gore-tex neochord placement x6 and 30 mm Sorin Memo 3D  ring annuloplasty via right mini thoracotomy approach  . Schatzki's ring   . Tricuspid regurgitation    Past Surgical History:  Procedure Laterality Date  . APPENDECTOMY    . BREAST SURGERY Right 2003ish   biopsy  . CARDIAC CATHETERIZATION N/A 04/12/2016   Procedure: Right/Left Heart Cath and Coronary Angiography;  Surgeon: Leonie Man, MD;  Location: [facility];  Service: Cardiovascular;  Laterality: N/A;  . CARDIOVERSION N/A 08/16/2017   Procedure: CARDIOVERSION;  Surgeon: Arnoldo Lenis, MD;  Location: [name];  Service: Endoscopy;  Laterality: N/A;  . CATARACT EXTRACTION Bilateral 2017  . CLIPPING OF ATRIAL APPENDAGE  07/05/2016   Procedure: CLIPPING OF ATRIAL APPENDAGE;  Surgeon: Rexene Alberts, MD;  Location: [facility];  Service: Open Heart Surgery;;  . COLONOSCOPY  11/09   Dr. Gala Romney- external hemorrhoidal tags o/w normal rectal mucosa, s/p surgical resection with a normal appearing anastomosis 12cm, pan colonic diverticulum  . COLONOSCOPY N/A 06/02/2012   [id number] post low anterior resection. Pancolonic diverticulosis. Colonic polyp-tubular adenoma.  Surveillance due 2019.   Marland Kitchen COLONOSCOPY N/A 10/13/2015   Procedure: COLONOSCOPY;  Surgeon: Daneil Dolin, MD;  [name];  Service: Endoscopy;  Laterality: N/A;  0930  . COLONOSCOPY WITH PROPOFOL N/A 11/20/2018   Procedure: COLONOSCOPY WITH PROPOFOL;  Surgeon: Daneil Dolin, MD;  [name];  Service: Endoscopy;  Laterality: N/A;  2:30pm  . ESOPHAGOGASTRODUODENOSCOPY  07/2007   Dr. Daiva Nakayama cervical esophageal web, schatzki ring, large hiatal hernia  . ESOPHAGOGASTRODUODENOSCOPY (EGD) WITH PROPOFOL N/A 11/20/2018   Procedure: ESOPHAGOGASTRODUODENOSCOPY (EGD) WITH PROPOFOL;  Surgeon: Daneil Dolin, MD;  [name];  Service: Endoscopy;  Laterality: N/A;  . INGUNAL HERNIA REPAIR Left   . LOW ANTERIOR BOWEL RESECTION     NO ADJ CHEMO  . MINIMALLY INVASIVE MAZE PROCEDURE N/A  07/05/2016   Procedure: MINIMALLY INVASIVE MAZE PROCEDURE;  Surgeon: Rexene Alberts, MD;  Location: [facility];  Service: Open Heart Surgery;  Laterality: N/A;  . MITRAL VALVE REPAIR Right 07/05/2016   Procedure: MINIMALLY INVASIVE MITRAL VALVE REPAIR (MVR);  Surgeon: Rexene Alberts, MD;  Location: [facility];  Service: Open Heart Surgery;  Laterality: Right;  . MULTIPLE EXTRACTIONS WITH ALVEOLOPLASTY N/A 05/21/2016   Procedure: MULTIPLE EXTRACTION OF TOOTH #'S 5, 21 WITH ALVEOLOPLASTY AND GROSS DEBRIDEMENT OF TEETH;  Surgeon: Lenn Cal, DDS;  Location: [facility];  Service: Oral Surgery;  Laterality: N/A;  . POLYPECTOMY  11/20/2018   Procedure: POLYPECTOMY;  Surgeon: Daneil Dolin, MD;  [name];  Service: Endoscopy;;  colon  . TEE WITHOUT CARDIOVERSION N/A 02/20/2016   Procedure: TRANSESOPHAGEAL ECHOCARDIOGRAM (TEE);  Surgeon: Arnoldo Lenis, MD;  [name];  Service: Endoscopy;  Laterality: N/A;  . TEE WITHOUT CARDIOVERSION N/A 07/05/2016   Procedure: TRANSESOPHAGEAL ECHOCARDIOGRAM (TEE);  Surgeon: Rexene Alberts, MD;  Location: [facility];  Service: Open Heart Surgery;  Laterality: N/A;  . TOOTH EXTRACTION    . TUBAL LIGATION    . VEIN LIGATION AND STRIPPING     Social History   Socioeconomic History  . Marital status: Married    Spouse name: Not on file  . Number of children: 2  . Years of education: Not on file  . Highest education level: Not on file  Occupational History  . Not on file  Social Needs  . Financial resource strain: Not on file  . Food insecurity    Worry: Not on file    Inability: Not on file  . Transportation needs    Medical: Not on file    Non-medical: Not on file  Tobacco Use  . Smoking status: Never Smoker  . Smokeless tobacco: Never Used  Substance and Sexual Activity  . Alcohol use: No    Alcohol/week: 0.0 standard drinks  . Drug use: No  . Sexual activity: Not on file  Lifestyle  . Physical activity    Days per week: Not on  file    Minutes per session: Not on file  . Stress: Not on file  Relationships  . Social Herbalist on phone: Not on file    Gets together: Not on file    Attends religious service: Not on file    Active member of club or organization: Not on file    Attends meetings of clubs or organizations: Not on file    Relationship status: Not on file  Other Topics Concern  . Not on file  Social History Narrative  .  Not on file   Outpatient Encounter Medications as of 01/14/2019  Medication Sig  . acetaminophen (TYLENOL) 500 MG tablet Take 1,000 mg by mouth 2 (two) times daily as needed for moderate pain or headache.   . albuterol (PROVENTIL) (2.5 MG/3ML) 0.083% nebulizer solution Take 2.5 mg by nebulization every 6 (six) hours as needed for wheezing or shortness of breath.  . ALPRAZolam (XANAX) 0.5 MG tablet Take 0.5 mg by mouth 2 (two) times daily as needed for anxiety or sleep.   Marland Kitchen aspirin EC 81 MG EC tablet Take 1 tablet (81 mg total) by mouth daily.  . calcium carbonate (OS-CAL) 600 MG TABS tablet Take 600 mg by mouth 2 (two) times daily with a meal.  . Coenzyme Q10 100 MG TABS Take 100 mg by mouth every evening.   . furosemide (LASIX) 40 MG tablet Take 40 mg by mouth daily as needed for fluid.   Marland Kitchen levothyroxine (SYNTHROID) 50 MCG tablet Take 50 mcg by mouth daily before breakfast.  . Multiple Vitamin (MULTIVITAMIN) tablet Take 1 tablet by mouth daily.    . pantoprazole (PROTONIX) 40 MG tablet Take 40 mg by mouth daily.  Vladimir Faster Glycol-Propyl Glycol (SYSTANE ULTRA) 0.4-0.3 % SOLN Place 1 drop into both eyes daily as needed (dry eyes).   . simvastatin (ZOCOR) 40 MG tablet Take 40 mg daily by mouth.  . sodium chloride (OCEAN) 0.65 % SOLN nasal spray Place 1 spray into both nostrils daily as needed for congestion.   Marland Kitchen warfarin (COUMADIN) 2.5 MG tablet Take 2 tablets daily except 3 tablets on Tuesdays and Fridays or as directed (Patient taking differently: Take 5-7.5 mg by mouth See  admin instructions. Take 7.5 mg by mouth daily on Tuesday and Friday. Take 5 mg by mouth daily on all other days.)   No facility-administered encounter medications on file as of 01/14/2019.    ALLERGIES: Allergies  Allergen Reactions  . Iohexol Hives, Shortness Of Breath and Other (See Comments)     Pt. had a severe allergic reaction to IV contrast the last time she was injected and had to be seen in the ER.   Marland Kitchen Hydrocodone-Acetaminophen Hives  . Nabumetone Rash  . Prednisone Rash    VACCINATION STATUS:  There is no immunization history on file for this patient.  HPI [name] [name] is 78 y.o. female who presents today with repeat thyroid function tests for follow-up.  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  She is on levothyroxine 50 mcg p.o. every morning.    She presents with stable body weight, denies palpitations, tremors, heat/cold intolerance.    - She is on multiple medications including anticoagulation with warfarin related to her cardiac surgery for tricuspid regurgitation as well as atrial fibrillation. -Her current medication list does not include active amiodarone, however she has not seen her cardiologist for more than 6 months.      Review of Systems Limited as above.  Objective:    There were no vitals taken for this visit.  Wt Readings from Last 3 Encounters:  12/23/18 120 lb (54.4 kg)  11/20/18 121 lb (54.9 kg)  10/31/18 123 lb 6.4 oz (56 kg)   CMP     Component Value Date/Time   NA 144 08/12/2017 1409   K 3.8 08/12/2017 1409   CL 100 (L) 08/12/2017 1409   CO2 27 08/12/2017 1409   GLUCOSE  96 08/12/2017 1409   BUN 15 08/12/2017 1409   CREATININE 1.01 (H) 08/12/2017 1409   CALCIUM 9.2 08/12/2017 1409   PROT 6.7 07/03/2016 1157   ALBUMIN 3.8 07/03/2016 1157   AST 23 07/03/2016 1157   ALT 19 07/03/2016 1157    ALKPHOS 113 07/03/2016 1157   BILITOT 0.5 07/03/2016 1157   GFRNONAA 52 (L) 08/12/2017 1409   GFRAA >60 08/12/2017 1409    Diabetic Labs (most recent): Lab Results  Component Value Date   HGBA1C 5.6 07/03/2016    Lab Results  Component Value Date   TSH 2.146 01/07/2019   TSH 0.084 (L) 09/29/2018   TSH 0.311 (L) 06/30/2018   TSH 2.700 03/13/2017   TSH 3.327 07/27/2016   FREET4 1.09 01/07/2019   FREET4 1.27 09/29/2018   FREET4 1.23 06/30/2018    Recent Results (from the past 2160 hour(s))  POCT INR     Status: Normal   Collection Time: 11/06/18  8:59 AM  Result Value Ref Range   INR 2.5 2.0 - 3.0  SARS Coronavirus 2 (Performed in [facility] hospital lab)     Status: None   Collection Time: 11/17/18  7:11 AM   Specimen: Nasal Swab  Result Value Ref Range   SARS Coronavirus 2 NEGATIVE NEGATIVE    Comment: (NOTE) SARS-CoV-2 target nucleic acids are NOT DETECTED. The SARS-CoV-2 RNA is generally detectable in upper and lower respiratory specimens during the acute phase of infection. Negative results do not preclude SARS-CoV-2 infection, do not rule out co-infections with other pathogens, and should not be used as the sole basis for treatment or other patient management decisions. Negative results must be combined with clinical observations, patient history, and epidemiological information. The expected result is Negative. Fact Sheet for Patients: SugarRoll.be Fact Sheet for Healthcare Providers: https://www.woods-mathews.com/ This test is not yet approved or cleared by the [place] FDA and  has been authorized for detection and/or diagnosis of SARS-CoV-2 by FDA under an Emergency Use Authorization (EUA). This EUA will remain  in effect (meaning this test can be used) for the duration of the COVID-19 declaration under Section 56 4(b)(1) of the Act, 21 U.S.C. section 360bbb-3(b)(1), unless the authorization is terminated  or revoked sooner. Performed at [facility], [place] [address] [place], [place] [address]   POCT INR     Status: Normal   Collection Time: 12/01/18  8:51 AM  Result Value Ref Range   INR 2.5 2.0 - 3.0  POCT INR     Status: Normal   Collection Time: 01/02/19  9:12 AM  Result Value Ref Range   INR 2.2 2.0 - 3.0  T4, free     Status: None   Collection Time: 01/07/19 11:21 AM  Result Value Ref Range   Free T4 1.09 0.61 - 1.12 ng/dL    Comment: (NOTE) Biotin ingestion may interfere with free T4 tests. If the results are inconsistent with the TSH level, previous test results, or the clinical presentation, then consider biotin interference. If needed, order repeat testing after stopping biotin. Performed at [facility], [place] [address], [place], [place] [address]   TSH     Status: None   Collection Time: 01/07/19 11:21 AM  Result Value Ref Range   TSH 2.146 0.350 - 4.500 uIU/mL    Comment: Performed by a 3rd Generation assay with a functional sensitivity of <=0.01 uIU/mL. Performed at [facility], [address] [place], [place] [address]  Assessment & Plan:   1.  Hypothyroidism likely related to amiodarone treatment Her previsit thyroid function tests are consistent with appropriate replacement.  She is advised to continue levothyroxine 50 mcg p.o. daily before breakfast.     - We discussed about the correct intake of her thyroid hormone, on empty stomach at fasting, with water, separated by at least 30 minutes from breakfast and other medications,  and separated by more than 4 hours from calcium, iron, multivitamins, acid reflux medications (PPIs). -Patient is made aware of the fact that thyroid hormone replacement is needed for life, dose to be adjusted by periodic monitoring of thyroid function tests.  - I advised her  to maintain close follow up with Glenda Chroman, MD for primary care needs.  I encouraged her to resume her follow-up with her cardiologist  regarding her history of atrial fibrillation which required treatment with amiodarone.   Time for this visit: 15 minutes. Japera Wahls Prieur  participated in the discussions, expressed understanding, and voiced agreement with the above plans.  All questions were answered to her satisfaction. she is encouraged to contact clinic should she have any questions or concerns prior to her return visit.   Follow up plan: Return in about 4 months (around 05/16/2019) for Follow up with Pre-visit Labs.   Glade Lloyd, MD [facility] [address] [place], [place] [address] Phone: [phone]  Fax: [phone]     01/14/2019, 5:42 PM  This note was partially dictated with voice recognition software. Similar sounding words can be transcribed inadequately or may not  be corrected upon review.

## 2019-01-15 IMAGING — DX DG CHEST 2V
2 series · 2 of 2 positions shown · non-contrast
Comparison: 07/08/16

CLINICAL DATA: Cardiac surgery 5 days ago, follow-up exam

EXAM:
CHEST  2 VIEW

[chest lat]
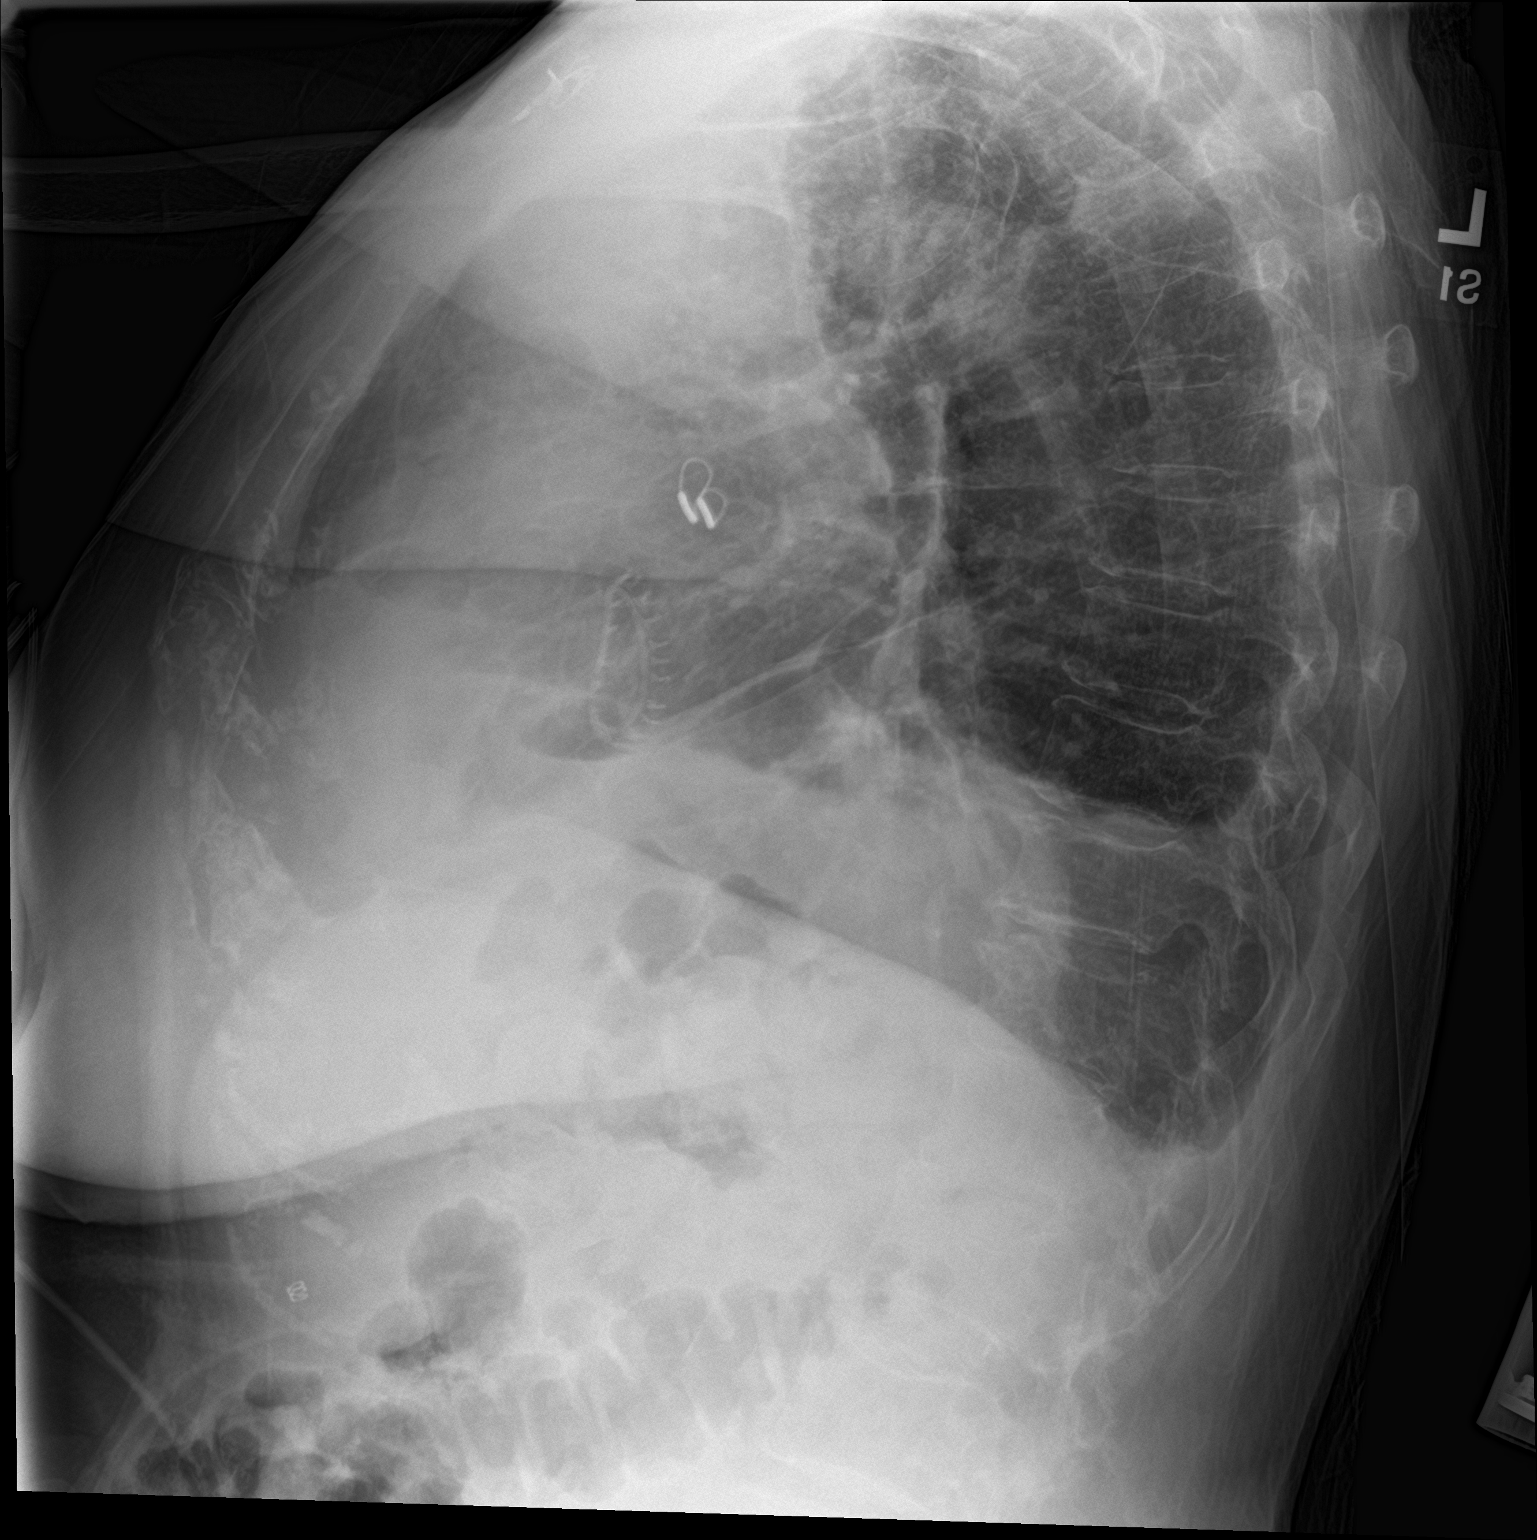

[chest ap]
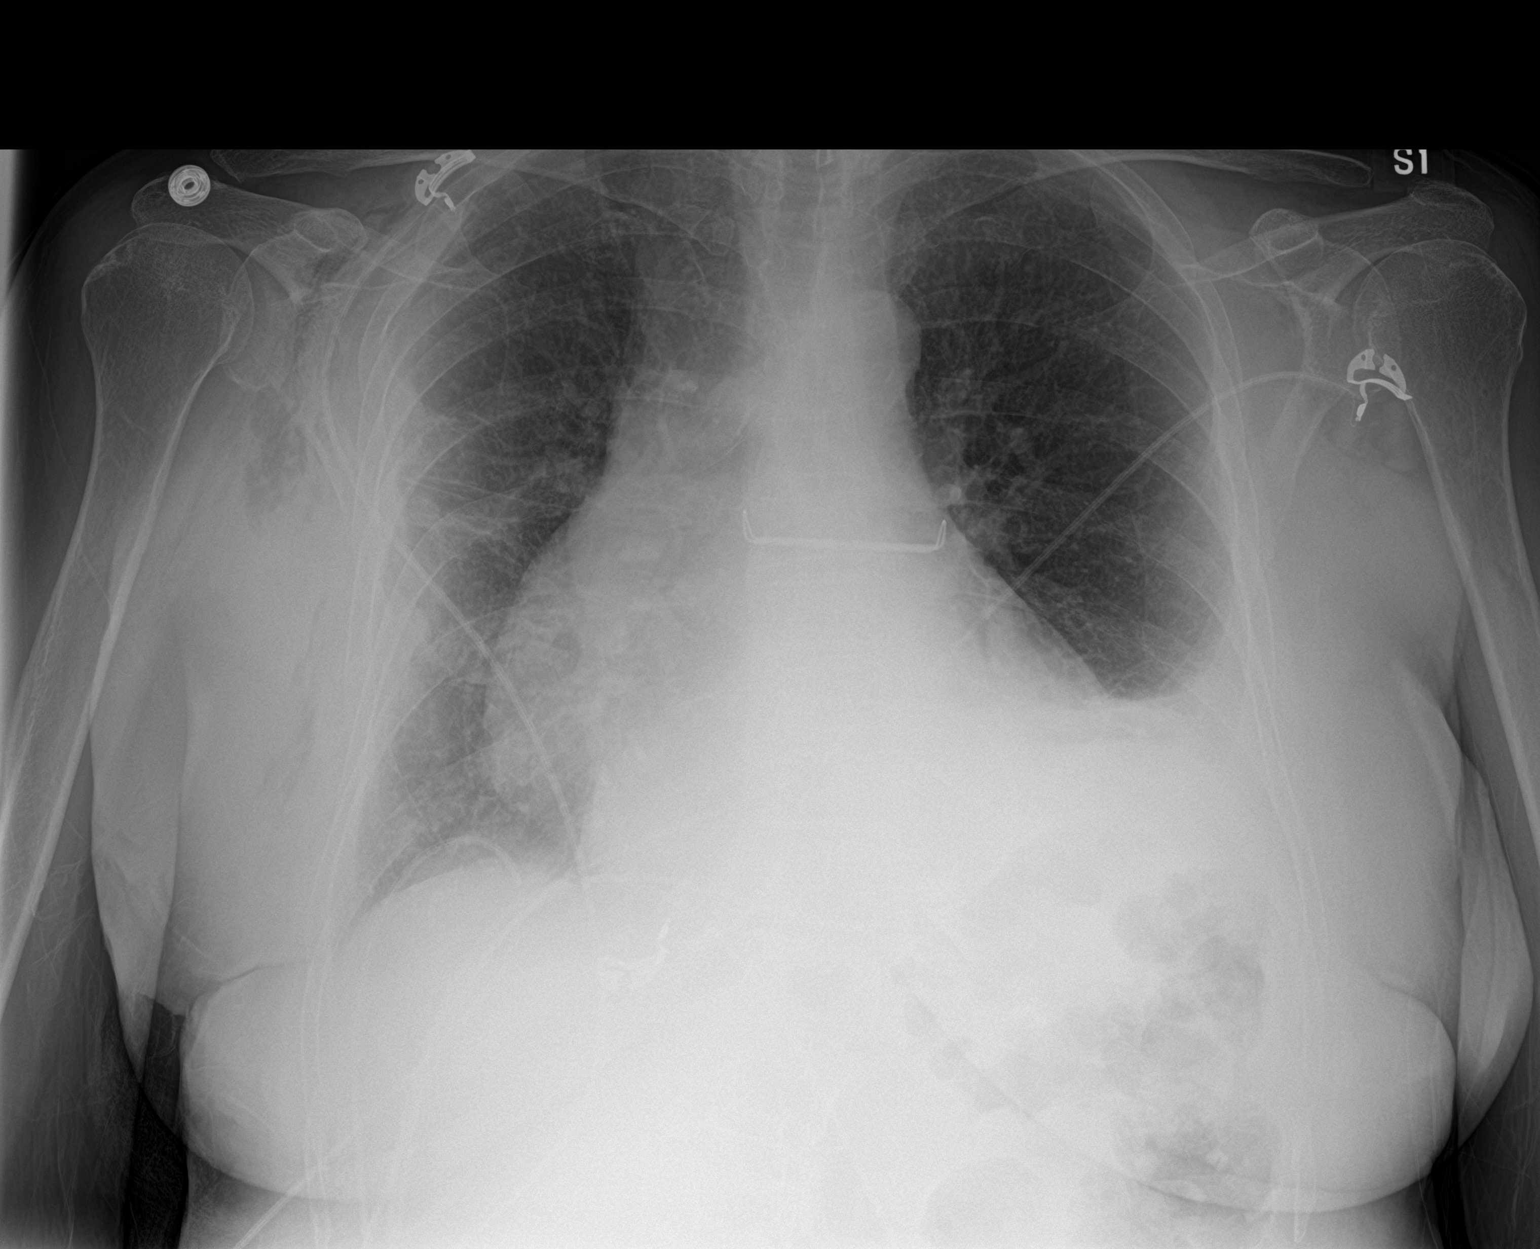

[2 of 2 positions shown; findings below may reference images not displayed]

FINDINGS: Cardiac shadow remains enlarged. Postsurgical changes are again
seen. The right-sided chest tubes have been removed in the interval.
No recurrent pneumothorax is noted. Mild atelectatic changes are
noted in the right mid lung. Left basilar atelectasis is noted
slightly increased from the prior exam with small left effusion. No
other focal abnormality is noted.
IMPRESSION: Increasing left basilar atelectasis and effusion.

Interval removal of right-sided thoracostomy catheters. No recurrent
pneumothorax is seen.

## 2019-01-19 DIAGNOSIS — J449 Chronic obstructive pulmonary disease, unspecified: Secondary | ICD-10-CM | POA: Diagnosis not present

## 2019-01-19 DIAGNOSIS — Z299 Encounter for prophylactic measures, unspecified: Secondary | ICD-10-CM | POA: Diagnosis not present

## 2019-01-19 DIAGNOSIS — F329 Major depressive disorder, single episode, unspecified: Secondary | ICD-10-CM | POA: Diagnosis not present

## 2019-01-19 DIAGNOSIS — Z6823 Body mass index (BMI) 23.0-23.9, adult: Secondary | ICD-10-CM | POA: Diagnosis not present

## 2019-01-19 DIAGNOSIS — I4891 Unspecified atrial fibrillation: Secondary | ICD-10-CM | POA: Diagnosis not present

## 2019-01-19 DIAGNOSIS — R413 Other amnesia: Secondary | ICD-10-CM | POA: Diagnosis not present

## 2019-01-22 DIAGNOSIS — Z9049 Acquired absence of other specified parts of digestive tract: Secondary | ICD-10-CM | POA: Diagnosis not present

## 2019-01-22 DIAGNOSIS — R935 Abnormal findings on diagnostic imaging of other abdominal regions, including retroperitoneum: Secondary | ICD-10-CM | POA: Diagnosis not present

## 2019-01-22 DIAGNOSIS — K449 Diaphragmatic hernia without obstruction or gangrene: Secondary | ICD-10-CM | POA: Diagnosis not present

## 2019-01-22 DIAGNOSIS — R599 Enlarged lymph nodes, unspecified: Secondary | ICD-10-CM | POA: Diagnosis not present

## 2019-01-22 DIAGNOSIS — R591 Generalized enlarged lymph nodes: Secondary | ICD-10-CM | POA: Diagnosis not present

## 2019-01-25 ENCOUNTER — Other Ambulatory Visit: Payer: Self-pay | Admitting: "Endocrinology

## 2019-01-26 ENCOUNTER — Other Ambulatory Visit: Payer: Self-pay

## 2019-01-26 MED ORDER — LEVOTHYROXINE SODIUM 50 MCG PO TABS: 50.0000 ug | ORAL_TABLET | Freq: Every day | ORAL | 1 refills | Status: DC

## 2019-01-27 DIAGNOSIS — H35371 Puckering of macula, right eye: Secondary | ICD-10-CM | POA: Diagnosis not present

## 2019-01-28 IMAGING — CR DG CHEST 2V
2 series · 2 of 2 positions shown · non-contrast
Comparison: PA and lateral chest 07/10/2016 and 07/08/2016.

CLINICAL DATA: The patient is status post mitral valve repair
07/05/2016. Shortness of breath and wheezing with exertion for 3
days.

EXAM:
CHEST  2 VIEW

[w chest pa]
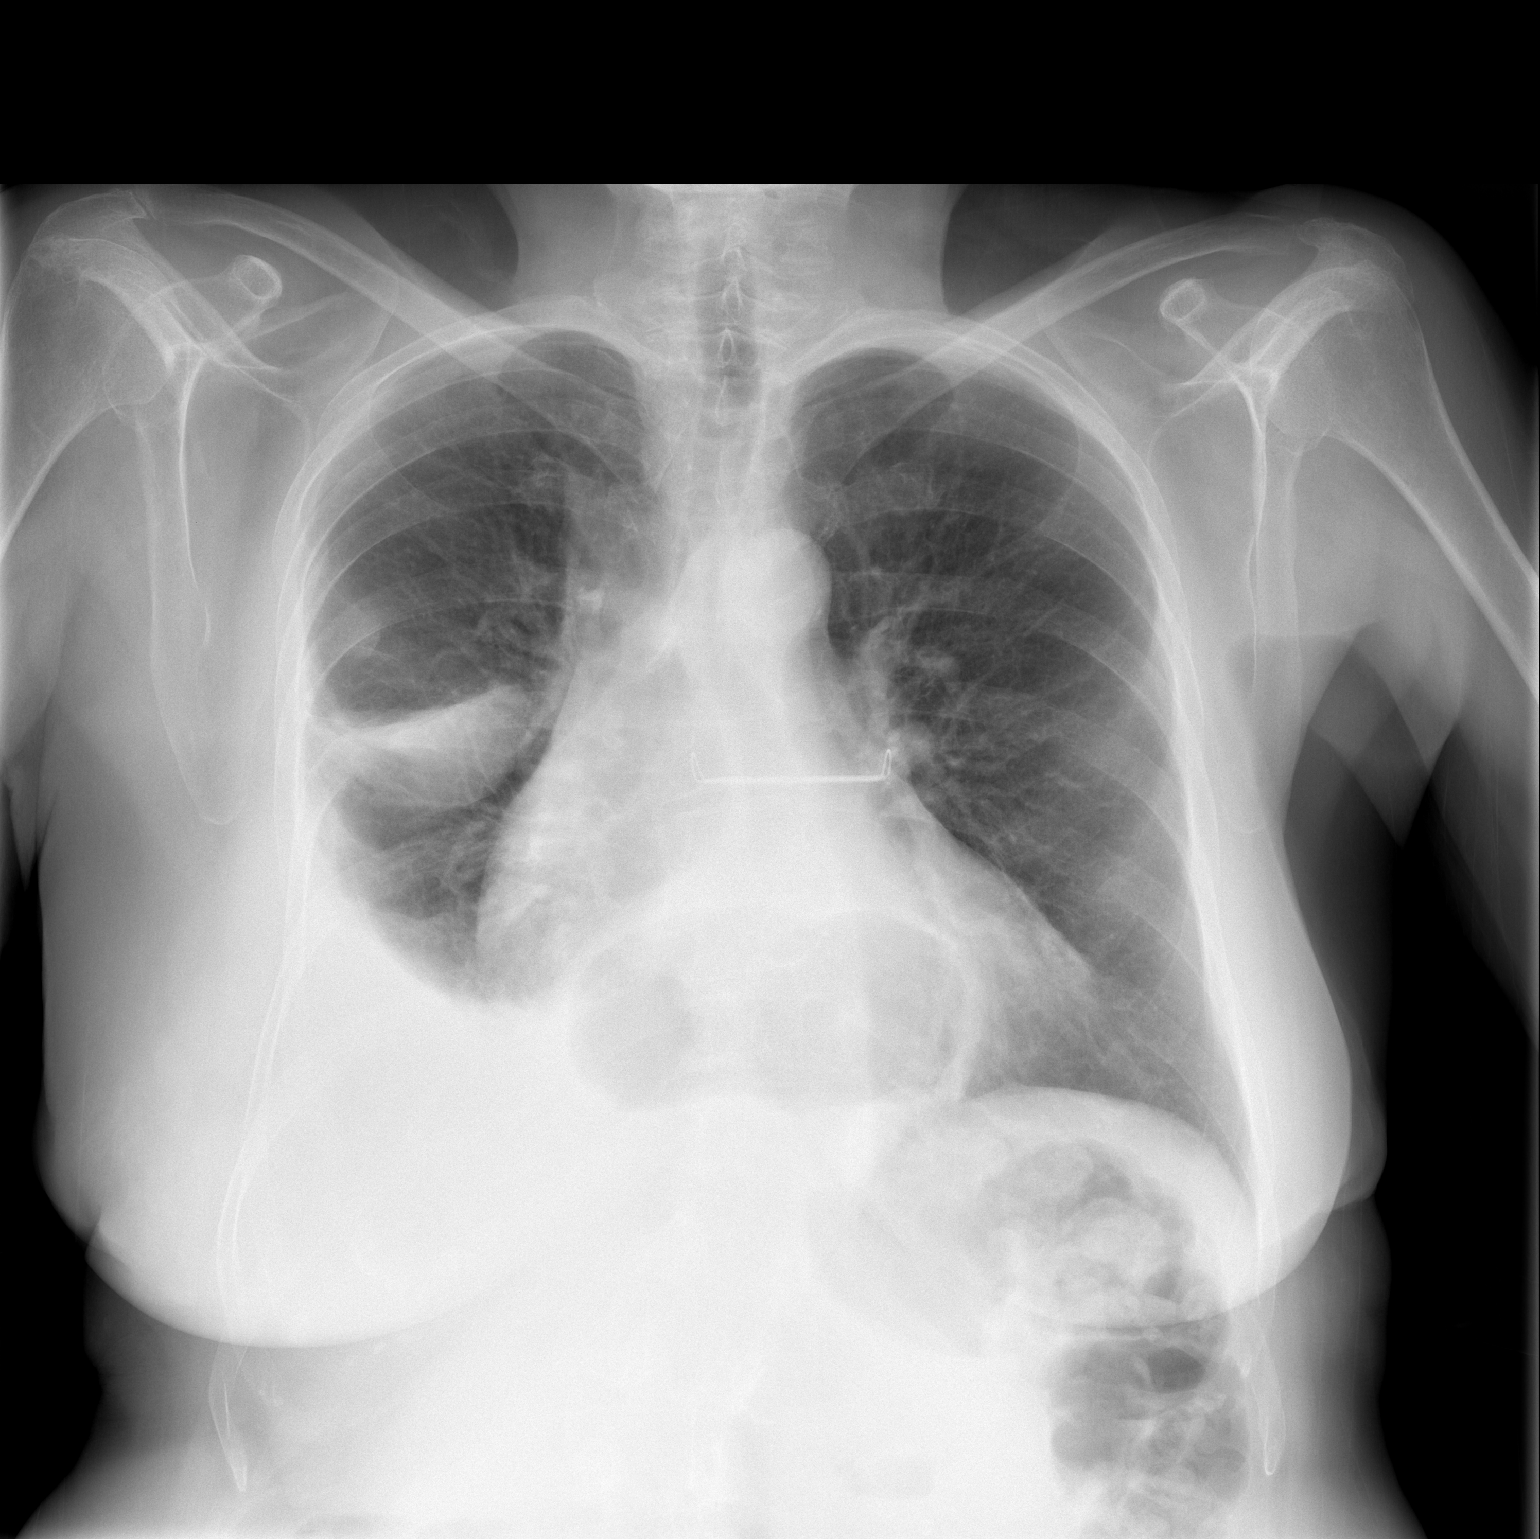

[w chest lat]
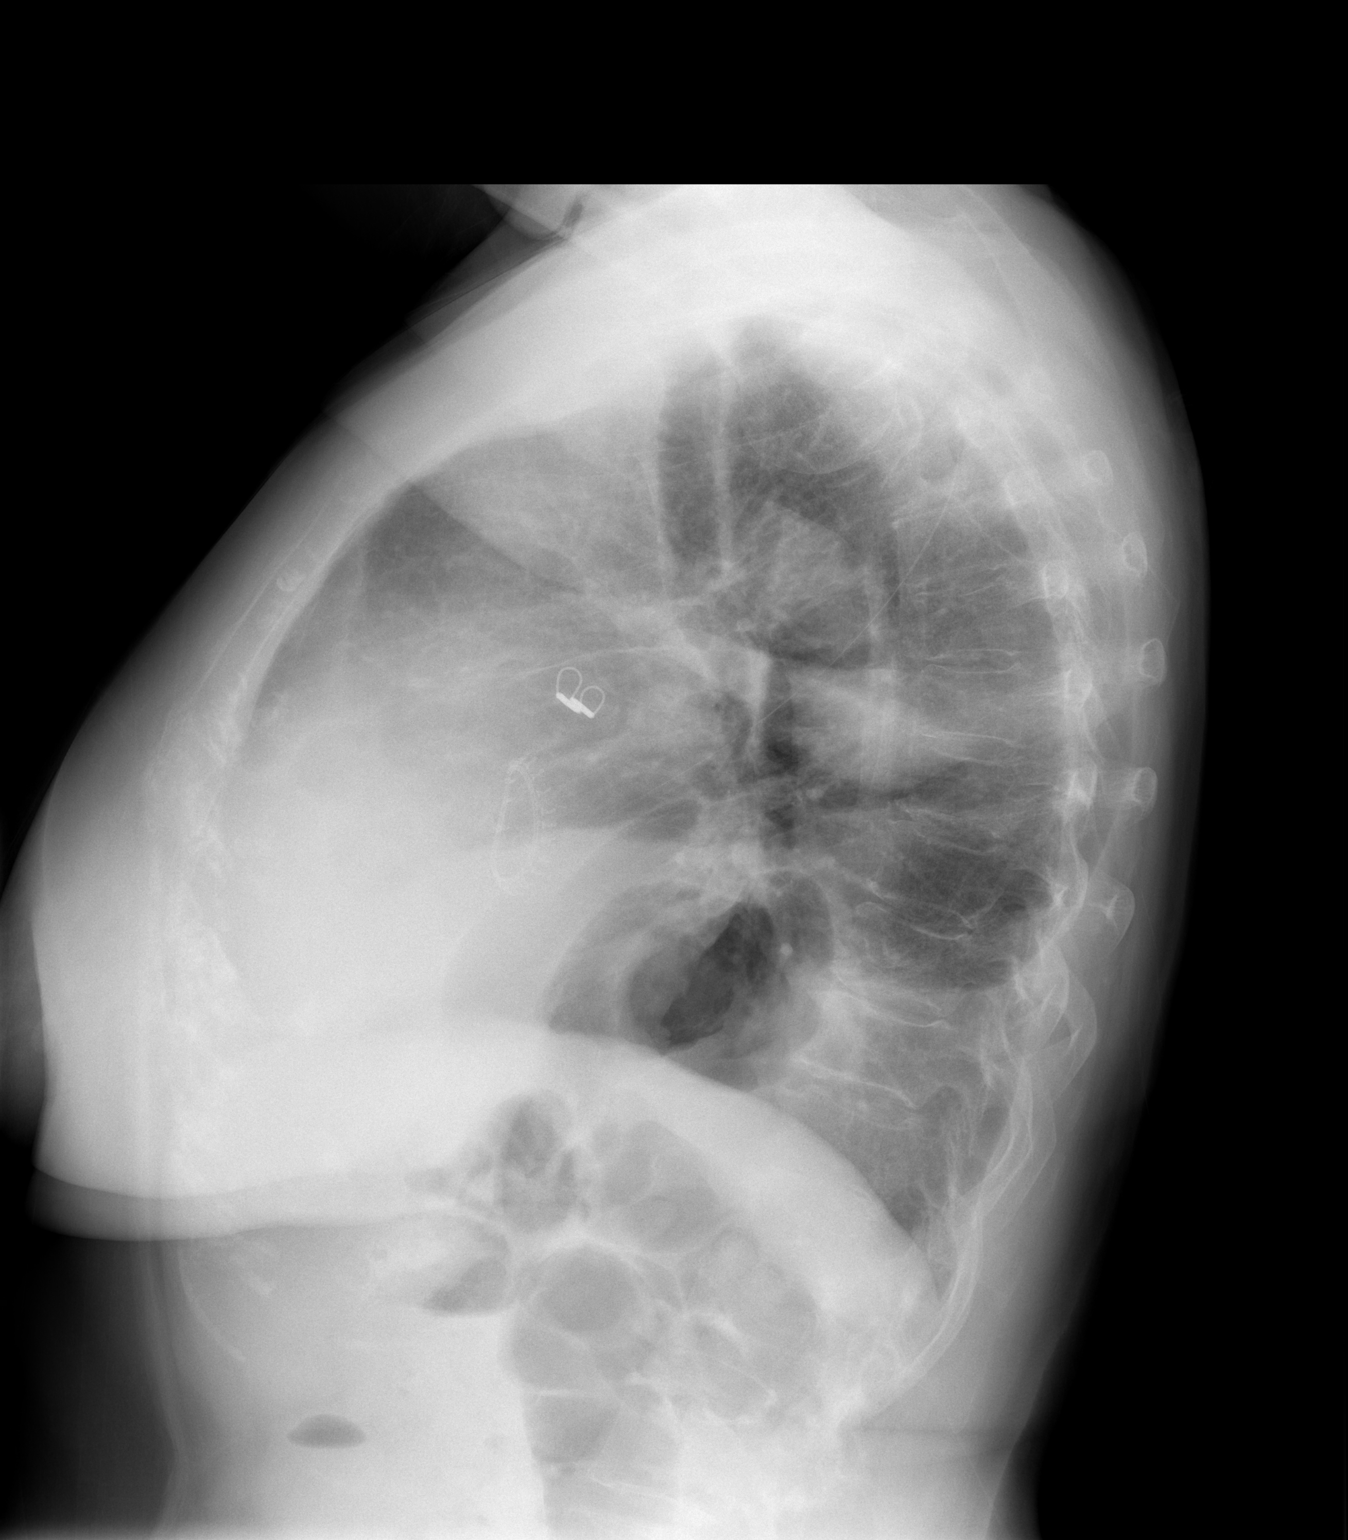

[2 of 2 positions shown; findings below may reference images not displayed]

FINDINGS: Left pleural effusion seen on the most recent examination has
resolved. Moderately large right pleural effusion is identified with
associated basilar airspace disease. Pleural fluid is also noted in
the minor fissure. The left lung is clear. There is cardiomegaly.
The patient is status post mitral valve repair and clipping of the
atrial appendage. Prominent hiatal hernia is noted. There is convex
right lumbar scoliosis.
IMPRESSION: New moderate to moderately large right pleural effusion with
associated basilar airspace disease, likely atelectasis.

Resolved left pleural effusion since the most recent examination.

Cardiomegaly without edema.

Hiatal hernia.

## 2019-01-29 DIAGNOSIS — R591 Generalized enlarged lymph nodes: Secondary | ICD-10-CM | POA: Diagnosis not present

## 2019-01-29 DIAGNOSIS — R809 Proteinuria, unspecified: Secondary | ICD-10-CM | POA: Diagnosis not present

## 2019-01-29 DIAGNOSIS — Z23 Encounter for immunization: Secondary | ICD-10-CM | POA: Diagnosis not present

## 2019-01-29 DIAGNOSIS — R601 Generalized edema: Secondary | ICD-10-CM | POA: Diagnosis not present

## 2019-01-29 DIAGNOSIS — D124 Benign neoplasm of descending colon: Secondary | ICD-10-CM | POA: Diagnosis not present

## 2019-01-29 DIAGNOSIS — D010 Carcinoma in situ of colon: Secondary | ICD-10-CM | POA: Diagnosis not present

## 2019-01-29 DIAGNOSIS — J449 Chronic obstructive pulmonary disease, unspecified: Secondary | ICD-10-CM | POA: Diagnosis not present

## 2019-01-29 DIAGNOSIS — Z6822 Body mass index (BMI) 22.0-22.9, adult: Secondary | ICD-10-CM | POA: Diagnosis not present

## 2019-01-29 DIAGNOSIS — R9389 Abnormal findings on diagnostic imaging of other specified body structures: Secondary | ICD-10-CM | POA: Diagnosis not present

## 2019-01-29 DIAGNOSIS — K7460 Unspecified cirrhosis of liver: Secondary | ICD-10-CM | POA: Diagnosis not present

## 2019-01-29 DIAGNOSIS — K649 Unspecified hemorrhoids: Secondary | ICD-10-CM | POA: Diagnosis not present

## 2019-01-29 DIAGNOSIS — R7989 Other specified abnormal findings of blood chemistry: Secondary | ICD-10-CM | POA: Diagnosis not present

## 2019-01-29 DIAGNOSIS — R978 Other abnormal tumor markers: Secondary | ICD-10-CM | POA: Diagnosis not present

## 2019-01-29 DIAGNOSIS — R935 Abnormal findings on diagnostic imaging of other abdominal regions, including retroperitoneum: Secondary | ICD-10-CM | POA: Diagnosis not present

## 2019-01-30 DIAGNOSIS — M159 Polyosteoarthritis, unspecified: Secondary | ICD-10-CM | POA: Diagnosis not present

## 2019-01-30 DIAGNOSIS — J449 Chronic obstructive pulmonary disease, unspecified: Secondary | ICD-10-CM | POA: Diagnosis not present

## 2019-01-30 DIAGNOSIS — E7800 Pure hypercholesterolemia, unspecified: Secondary | ICD-10-CM | POA: Diagnosis not present

## 2019-01-30 DIAGNOSIS — I4891 Unspecified atrial fibrillation: Secondary | ICD-10-CM | POA: Diagnosis not present

## 2019-02-09 ENCOUNTER — Telehealth: Payer: Self-pay | Admitting: Pharmacist

## 2019-02-09 ENCOUNTER — Ambulatory Visit: Payer: Medicare Other | Admitting: Nurse Practitioner

## 2019-02-09 MED ORDER — APIXABAN 5 MG PO TABS: 5.0000 mg | ORAL_TABLET | Freq: Two times a day (BID) | ORAL | 5 refills | Status: DC

## 2019-02-09 NOTE — Telephone Encounter (Signed)
Patient called back with [employer] member number and telephone number. Member number [id number] Phone # [phone] Patient not sure she will be able to afford even if cost came down to ~$40/month if the 135 in a deductible. She states that she has one that is around 30 per month and keeps going up. (possible in the coverage gap)

## 2019-02-09 NOTE — Telephone Encounter (Signed)
Called pt to discuss changing from warfarin to [place] therapy due to improved efficacy and safety data, as well as for decreased office visits in light of the COVID-19 pandemic.  Spoke with pt who is agreeable to changing to Eliquis. However, looks as though pt has deductible to meet still - after free month of Eliquis, subsequent copay would be $135, then $45/month. Pt will look into plans for next year with lower deductible.

## 2019-02-10 ENCOUNTER — Ambulatory Visit: Payer: Medicare Other | Admitting: "Endocrinology

## 2019-02-12 ENCOUNTER — Other Ambulatory Visit: Payer: Self-pay

## 2019-02-12 ENCOUNTER — Ambulatory Visit (INDEPENDENT_AMBULATORY_CARE_PROVIDER_SITE_OTHER): Payer: Medicare Other | Admitting: *Deleted

## 2019-02-12 DIAGNOSIS — Z5181 Encounter for therapeutic drug level monitoring: Secondary | ICD-10-CM | POA: Diagnosis not present

## 2019-02-12 DIAGNOSIS — Z9889 Other specified postprocedural states: Secondary | ICD-10-CM | POA: Diagnosis not present

## 2019-02-12 DIAGNOSIS — I4819 Other persistent atrial fibrillation: Secondary | ICD-10-CM

## 2019-02-12 LAB — POCT INR: INR: 2.1 (ref 2.0–3.0)

## 2019-02-12 NOTE — Patient Instructions (Addendum)
Continue coumadin 2 tablets daily except 3 tablets on Tuesdays and Fridays Recheck in 6 weeks Pt not interested in Eliquis at this time due to cost.

## 2019-02-17 ENCOUNTER — Encounter: Payer: Self-pay | Admitting: Nurse Practitioner

## 2019-02-17 ENCOUNTER — Ambulatory Visit (INDEPENDENT_AMBULATORY_CARE_PROVIDER_SITE_OTHER): Payer: Medicare Other | Admitting: Nurse Practitioner

## 2019-02-17 ENCOUNTER — Other Ambulatory Visit: Payer: Self-pay

## 2019-02-17 VITALS — BP 126/72 | HR 76 | Temp 97.4°F | Ht 61.0 in | Wt 122.8 lb

## 2019-02-17 DIAGNOSIS — R1030 Lower abdominal pain, unspecified: Secondary | ICD-10-CM

## 2019-02-17 DIAGNOSIS — R935 Abnormal findings on diagnostic imaging of other abdominal regions, including retroperitoneum: Secondary | ICD-10-CM

## 2019-02-17 DIAGNOSIS — K219 Gastro-esophageal reflux disease without esophagitis: Secondary | ICD-10-CM | POA: Diagnosis not present

## 2019-02-17 MED ORDER — PANTOPRAZOLE SODIUM 40 MG PO TBEC: 40.0000 mg | DELAYED_RELEASE_TABLET | Freq: Every day | ORAL | 3 refills | Status: DC

## 2019-02-17 NOTE — Progress Notes (Signed)
Referring Provider: Glenda Chroman, MD Primary Care Physician:  Glenda Chroman, MD Primary GI:  Dr. Gala Romney  Chief Complaint  Patient presents with  . Abdominal Pain    RLQ, comes/goes, pain can last few hrs  . Gastroesophageal Reflux    reports pharmacy never filled her pantoprazole    HPI:   [name] [name] is a 78 y.o. female who presents for follow-up.  The patient was last seen in our office 10/31/2018 for hemorrhoids, lower abdominal pain, abnormal CT of the abdomen, weight loss.  At her last visit she was noted to be due for colonoscopy and already scheduled for the end of July.  Previously noted grade 3/4 hemorrhoids with significant symptoms.  Previously tried apothecary cream and sitz bath's with persistent once weekly scant toilet tissue hematochezia and difficulty in maintaining adequate hygiene.  CT updated before her last visit found minimally prominent stool throughout the colon, large hiatal hernia with questionable wall thickening of the stomach cannot exclude gastritis or infiltrative process and recommended upper endoscopy.  Also noted probable tiny bilateral inguinal hernias containing fat.  Subsequently EGD was added to her already schedule colonoscopy.  At her last visit she had some left lower quadrant and lower abdominal pain that is not bad but "it is there."  No recent hemorrhoid issues.  No further weight loss.  No other GI complaints.  Recommended consider adding supplement such as Ensure, Tums as needed, rectal cream and sitz bath as needed for worsening hemorrhoid symptoms, follow-up in 3 months.  Colonoscopy completed 11/20/2018 (high risk surveillance due to personal history of colon polyps and personal history of colon cancer) which found a single 5 mm polyp at the surgical anastomosis, otherwise normal, diverticulosis in the entire colon.  Surgical pathology, polyp to be hyperplastic.  Recommended no repeat colonoscopy due to age.  EGD the same day found normal  but tortuous esophagus, large hiatal hernia, otherwise normal.  Suspected artifact on CT.  Recommended trial of Protonix 40 mg daily.  She called her office 11/25/2018 indicated no bowel movement since her recent colonoscopy.  Took Dulcolax without change.  Recommended MiraLAX purge and she was provided instructions.  Family called to inform her recommendations she indicated she had since had a good bowel movement and will call us for any further problems.  Today she states she's doing ok overall. 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 She states her pharmacy never filled her Protonix, despite calling them several times (they kept saying "it'll be ready soon.") Has reflux symptoms since she hasn't had her Protonix. Symptoms aren't severe. Has some mid-lower and RLQ abdominal pain. Pain is intermittent, typically occurs once a month or as frequently as 2 times a week. Pain described as burning. Sometimes ice helps and resolves shortly; sometimes takes a little longer. Has a bowel movement 2-3 times a day, typically no straining (though has had some this week.) Denies N/V, hematochezia, melena, fever, chills, unintentional weight loss. Denies URI or flu-like symptoms. Denies loss of sense of taste or smell. Denies chest pain, dyspnea, dizziness, lightheadedness, syncope, near syncope. Denies any other upper or lower GI symptoms.  Past Medical History:  Diagnosis Date  . Anxiety   . Arthritis   . Asthma   . Atrial fibrillation, persistent ([place])   . Chronic diastolic congestive heart failure ([place])   . Colon adenoma   . Colon cancer ([place])    status post low anterior resection, limited stage disease requiring no adjuvant therapy  .  COPD (chronic obstructive pulmonary disease) ([place])   . Depression   . Diverticulosis   . DM (dermatomyositis)   . DVT (deep venous thrombosis) ([name])    in leg- long time ago  . Dyspnea    with activity  . Dysrhythmia   . Esophageal dysphagia   . GERD (gastroesophageal  reflux disease)   . Headache   . Heart murmur   . Hematuria   . Hemorrhoids   . Hiatal hernia   . History of kidney stones    x 2  . Hypercholesterolemia   . Incidental pulmonary nodule 05/08/2016   8 mm vague opacity RML noted on CT scan  . Mitral regurgitation   . PONV (postoperative nausea and vomiting) 2003 ish    with breast biopsy  . S/P minimally invasive maze operation for atrial fibrillation 07/05/2016   Complete bilateral atrial lesion set using cryothermy and bipolar radiofrequency ablation with clipping of LA appendage via right mini thoracotomy approach  . 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 Schatzki's ring   . Tricuspid regurgitation     Past Surgical History:  Procedure Laterality Date  . APPENDECTOMY    . BREAST SURGERY Right 2003ish   biopsy  . CARDIAC CATHETERIZATION N/A 04/12/2016   Procedure: Right/Left Heart Cath and Coronary Angiography;  Surgeon: Leonie Man, MD;  Location: [facility];  Service: Cardiovascular;  Laterality: N/A;  . CARDIOVERSION N/A 08/16/2017   Procedure: CARDIOVERSION;  Surgeon: Arnoldo Lenis, MD;  Location: [name];  Service: Endoscopy;  Laterality: N/A;  . CATARACT EXTRACTION Bilateral 2017  . CLIPPING OF ATRIAL APPENDAGE  07/05/2016   Procedure: CLIPPING OF ATRIAL APPENDAGE;  Surgeon: Rexene Alberts, MD;  Location: [facility];  Service: Open Heart Surgery;;  . COLONOSCOPY  11/09   Dr. Gala Romney- external hemorrhoidal tags o/w normal rectal mucosa, s/p surgical resection with a normal appearing anastomosis 12cm, pan colonic diverticulum  . COLONOSCOPY N/A 06/02/2012   [id number] post low anterior resection. Pancolonic diverticulosis. Colonic polyp-tubular adenoma. Surveillance due 2019.   Marland Kitchen COLONOSCOPY N/A 10/13/2015   Procedure: COLONOSCOPY;  Surgeon: Daneil Dolin, MD;  [name];  Service: Endoscopy;  Laterality: N/A;  0930  . COLONOSCOPY WITH PROPOFOL N/A 11/20/2018   Procedure: COLONOSCOPY WITH PROPOFOL;  Surgeon: Daneil Dolin, MD;  [name];  Service: Endoscopy;  Laterality: N/A;  2:30pm  . ESOPHAGOGASTRODUODENOSCOPY  07/2007   Dr. Daiva Nakayama cervical esophageal web, schatzki ring, large hiatal hernia  . ESOPHAGOGASTRODUODENOSCOPY (EGD) WITH PROPOFOL N/A 11/20/2018   Procedure: ESOPHAGOGASTRODUODENOSCOPY (EGD) WITH PROPOFOL;  Surgeon: Daneil Dolin, MD;  [name];  Service: Endoscopy;  Laterality: N/A;  . INGUNAL HERNIA REPAIR Left   . LOW ANTERIOR BOWEL RESECTION     NO ADJ CHEMO  . MINIMALLY INVASIVE MAZE PROCEDURE N/A 07/05/2016   Procedure: MINIMALLY INVASIVE MAZE PROCEDURE;  Surgeon: Rexene Alberts, MD;  Location: [facility];  Service: Open Heart Surgery;  Laterality: N/A;  . MITRAL VALVE REPAIR Right 07/05/2016   Procedure: MINIMALLY INVASIVE MITRAL VALVE REPAIR (MVR);  Surgeon: Rexene Alberts, MD;  Location: [facility];  Service: Open Heart Surgery;  Laterality: Right;  . MULTIPLE EXTRACTIONS WITH ALVEOLOPLASTY N/A 05/21/2016   Procedure: MULTIPLE EXTRACTION OF TOOTH #'S 5, 21 WITH ALVEOLOPLASTY AND GROSS DEBRIDEMENT OF TEETH;  Surgeon: Lenn Cal, DDS;  Location: [facility];  Service: Oral Surgery;  Laterality: N/A;  . POLYPECTOMY  11/20/2018   Procedure: POLYPECTOMY;  Surgeon: Daneil Dolin, MD;  [name];  Service: Endoscopy;;  colon  . TEE WITHOUT CARDIOVERSION N/A 02/20/2016   Procedure: TRANSESOPHAGEAL ECHOCARDIOGRAM (TEE);  Surgeon: Arnoldo Lenis, MD;  [name];  Service: Endoscopy;  Laterality: N/A;  . TEE WITHOUT CARDIOVERSION N/A 07/05/2016   Procedure: TRANSESOPHAGEAL ECHOCARDIOGRAM (TEE);  Surgeon: Rexene Alberts, MD;  Location: [facility];  Service: Open Heart Surgery;  Laterality: N/A;  . TOOTH EXTRACTION    . TUBAL LIGATION    . VEIN LIGATION AND  STRIPPING      Current Outpatient Medications  Medication Sig Dispense Refill  . acetaminophen (TYLENOL) 500 MG tablet Take 1,000 mg by mouth 2 (two) times daily as needed for moderate pain or headache.     . albuterol (PROVENTIL) (2.5 MG/3ML) 0.083% nebulizer solution Take 2.5 mg by nebulization every 6 (six) hours as needed for wheezing or shortness of breath.    . ALPRAZolam (XANAX) 0.5 MG tablet Take 0.5 mg by mouth 2 (two) times daily as needed for anxiety or sleep.     Marland Kitchen aspirin EC 81 MG EC tablet Take 1 tablet (81 mg total) by mouth daily.    . Coenzyme Q10 100 MG TABS Take 100 mg by mouth every evening.     . furosemide (LASIX) 40 MG tablet Take 40 mg by mouth daily as needed for fluid.     Marland Kitchen levothyroxine (SYNTHROID) 50 MCG tablet Take 1 tablet (50 mcg total) by mouth daily before breakfast. 90 tablet 1  . Multiple Vitamin (MULTIVITAMIN) tablet Take 1 tablet by mouth daily.      Vladimir Faster Glycol-Propyl Glycol (SYSTANE ULTRA) 0.4-0.3 % SOLN Place 1 drop into both eyes daily as needed (dry eyes).     . simvastatin (ZOCOR) 40 MG tablet Take 40 mg daily by mouth.    . sodium chloride (OCEAN) 0.65 % SOLN nasal spray Place 1 spray into both nostrils daily as needed for congestion.     Marland Kitchen warfarin (COUMADIN) 2.5 MG tablet Take 2 tablets daily except 3 tablets on Tuesdays and Fridays or as directed (Patient taking differently: Take 5-7.5 mg by mouth See admin instructions. Take 7.5 mg by mouth daily on Tuesday and Friday. Take 5 mg by mouth daily on all other days.) 200 tablet 2  . calcium carbonate (OS-CAL) 600 MG TABS tablet Take 600 mg by mouth 2 (two) times daily with a meal.    . pantoprazole (PROTONIX) 40 MG tablet Take 1 tablet (40 mg total) by mouth daily. 90 tablet 3   No current facility-administered medications for this visit.     Allergies as of 02/17/2019 - Review Complete 02/17/2019  Allergen Reaction Noted  . Iohexol Hives, Shortness Of Breath, and Other (See Comments)  07/16/2008  . Hydrocodone-acetaminophen Hives 04/26/2010  . Nabumetone Rash 04/26/2010  . Prednisone Rash 10/13/2015    Family History  Problem Relation Age of Onset  . Breast cancer Mother   . Rheum arthritis Father   . Cancer - Lung Sister   . Brain cancer Sister   . Prostate cancer Brother   . Aneurysm Brother   . Colon cancer Neg Hx     Social History   Socioeconomic History  . Marital status: Married    Spouse name: Not on file  . Number of children:  2  . Years of education: Not on file  . Highest education level: Not on file  Occupational History  . Not on file  Social Needs  . Financial resource strain: Not on file  . Food insecurity    Worry: Not on file    Inability: Not on file  . Transportation needs    Medical: Not on file    Non-medical: Not on file  Tobacco Use  . Smoking status: Never Smoker  . Smokeless tobacco: Never Used  Substance and Sexual Activity  . Alcohol use: No    Alcohol/week: 0.0 standard drinks  . Drug use: No  . Sexual activity: Not on file  Lifestyle  . Physical activity    Days per week: Not on file    Minutes per session: Not on file  . Stress: Not on file  Relationships  . Social Herbalist on phone: Not on file    Gets together: Not on file    Attends religious service: Not on file    Active member of club or organization: Not on file    Attends meetings of clubs or organizations: Not on file    Relationship status: Not on file  Other Topics Concern  . Not on file  Social History Narrative  . Not on file    Review of Systems: General: Negative for anorexia, weight loss, fever, chills, fatigue, weakness. ENT: Negative for hoarseness, difficulty swallowing. CV: Negative for chest pain, angina, palpitations, peripheral edema.  Respiratory: Negative for dyspnea at rest, cough, sputum, wheezing.  GI: See history of present illness. Endo: Negative for unusual weight change.  Heme: Negative for bruising or  bleeding. Allergy: Negative for rash or hives.   Physical Exam: BP 126/72   Pulse 76   Temp (!) 97.4 F (36.3 C) (Oral)   Ht 5\' 1"  (1.549 m)   Wt 122 lb 12.8 oz (55.7 kg)   BMI 23.20 kg/m  General:   Alert and oriented. Pleasant and cooperative. Well-nourished and well-developed.  Eyes:  Without icterus, sclera clear and conjunctiva pink.  Ears:  Normal auditory acuity. Cardiovascular:  S1, S2 present without murmurs appreciated. Extremities without clubbing or edema. Respiratory:  Clear to auscultation bilaterally. No wheezes, rales, or rhonchi. No distress.  Gastrointestinal:  +BS, soft, non-tender and non-distended. No HSM noted. No guarding or rebound. No masses appreciated.  Rectal:  Deferred  Musculoskalatal:  Symmetrical without gross deformities. Neurologic:  Alert and oriented x4;  grossly normal neurologically. Psych:  Alert and cooperative. Normal mood and affect. Heme/Lymph/Immune: No excessive bruising noted.    02/17/2019 11:50 AM   Disclaimer: This note was dictated with voice recognition software. Similar sounding words can inadvertently be transcribed and may not be corrected upon review.

## 2019-02-17 NOTE — Assessment & Plan Note (Signed)
She is already had a significant evaluation of her abdominal pain including CT of the abdomen and pelvis, colonoscopy, endoscopy.  She does have some persistent, but nonsevere right lower quadrant and mid lower abdomen discomfort.  Denies overt constipation or diarrhea.  Typically worse with certain movements such as stretching or heavy lifting.  The pain is in the area where she had cardiology procedure (possible cath?).  Pain is likely nonspecific, possible scar tissue versus musculoskeletal in nature.  CT did show tiny possible bilateral inguinal hernias which could be contributing as well.  At this point recommend she avoid trigger activities, continue to monitor her pain and notify us of any worsening.  Follow-up in 6 months.

## 2019-02-17 NOTE — Assessment & Plan Note (Signed)
Abnormal CT of the abdomen showing stomach wall thickening evaluated by endoscopic procedure found to be likely CT artifact.  Continue to monitor for any changes.

## 2019-02-17 NOTE — Patient Instructions (Signed)
Your health issues we discussed today were:   GERD (reflux/heartburn): 1. I have sent a refill of Protonix to the new [facility] in [place], [place]. 2. Continue to take this as previously recommended once you are able to pick it up 3. Call us if you have any worsening or severe symptoms  Abdominal pain: 1. As we discussed, you have had a significant work-up including colonoscopy, endoscopy, CT exam.  There is no significant findings to explain your abdominal pain 2. It is possibly coming from scar tissue, musculoskeletal problem, or tiny possible hernia in your groin area 3. Avoid activities that tend to worsen your pain such as heavy lifting, stretching, certain movements 4. Monitor your symptoms and let us know if you get any worse  Overall I recommend:  1. Continue your other current medications 2. Return for follow-up in 6 months 3. Call us if you have any questions or concerns.   Because of recent events of COVID-19 ("Coronavirus"), follow CDC recommendations:  1. Wash your hand frequently 2. Avoid touching your face 3. Stay away from people who are sick 4. If you have symptoms such as fever, cough, shortness of breath then call your healthcare provider for further guidance 5. If you are sick, STAY AT HOME unless otherwise directed by your healthcare provider. 6. Follow directions from state and national officials regarding staying safe   At [facility] we value your feedback. You may receive a survey about your visit today. Please share your experience as we strive to create trusting relationships with our patients to provide genuine, compassionate, quality care.  We appreciate your understanding and patience as we review any laboratory studies, imaging, and other diagnostic tests that are ordered as we care for you. Our office policy is 5 business days for review of these results, and any emergent or urgent results are addressed in a timely manner  for your best interest. If you do not hear from our office in 1 week, please contact us.   We also encourage the use of MyChart, which contains your medical information for your review as well. If you are not enrolled in this feature, an access code is on this after visit summary for your convenience. Thank you for allowing [name] to be involved in your care.  It was great to see you today!  I hope you have a great Fall and Happy Halloween!!

## 2019-02-17 NOTE — Assessment & Plan Note (Signed)
Chronic GERD symptoms.  She is having some worsening due to not on Protonix currently as her pharmacy has not filled it.  I have sent a refill of Protonix to a different pharmacy as she is likely changing due to multiple issues with her current pharmacy.  Recommend she take this as soon as she can pick it up.  Follow-up in 6 months.

## 2019-02-19 IMAGING — CR DG CHEST 2V
2 series · 2 of 2 positions shown · non-contrast
Comparison: 07/27/2016.

CLINICAL DATA: Mitral valve surgery.  Shortness of breath.

EXAM:
CHEST  2 VIEW

[w chest pa]
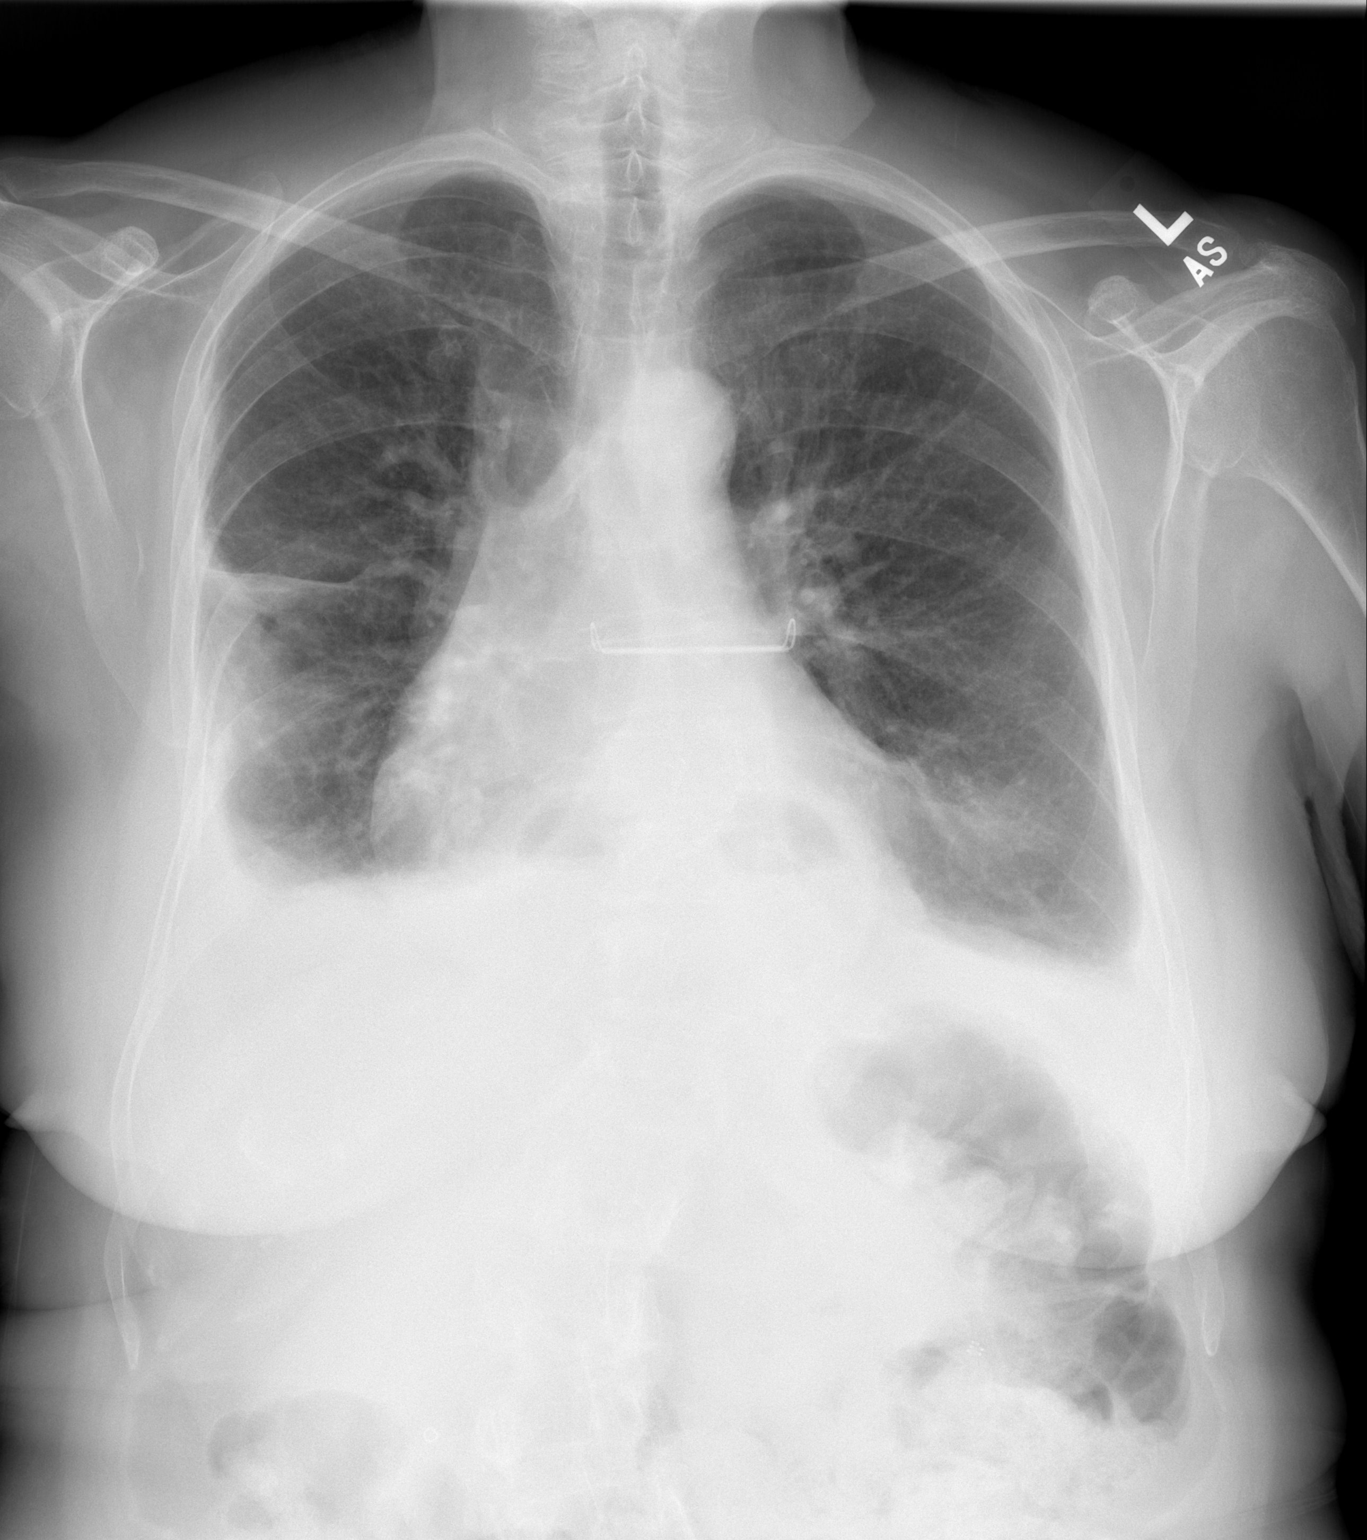

[w chest lat]
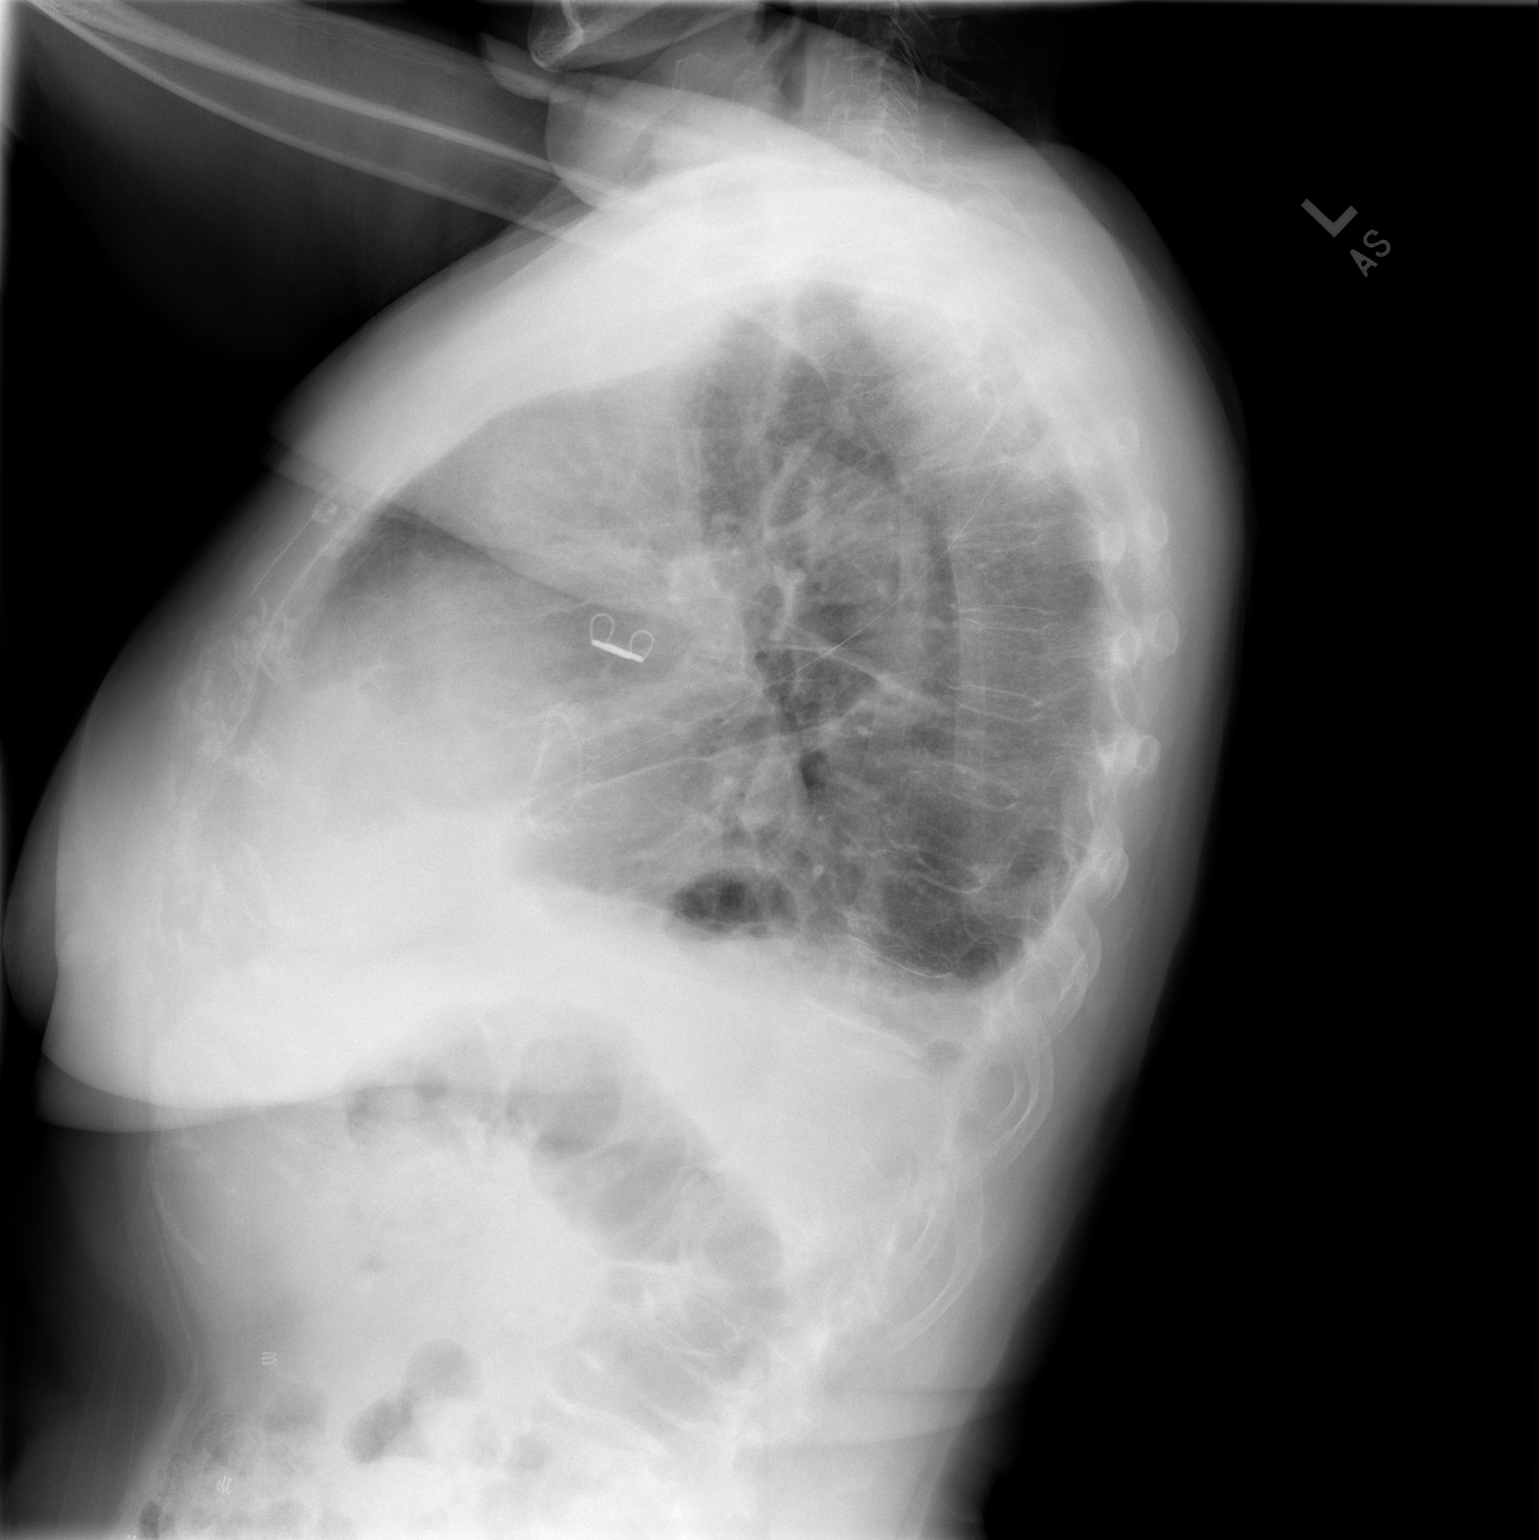

[2 of 2 positions shown; findings below may reference images not displayed]

FINDINGS: Prior cardiac valve replacement. Left atrial appendage clip.
Cardiomegaly with mild pulmonary vascular prominence and bilateral
interstitial prominence. Small bilateral pleural effusions. Findings
consistent CHF. Findings have improved from prior study 07/27/2016.
No pneumothorax. Sliding hiatal hernia again noted.
IMPRESSION: 1. Prior cardiac valve replacement. Findings consistent with mild
CHF. Findings of CHF have improved from prior exam.

2. Sliding hiatal hernia.

## 2019-03-02 ENCOUNTER — Other Ambulatory Visit: Payer: Self-pay | Admitting: "Endocrinology

## 2019-03-25 DIAGNOSIS — Z789 Other specified health status: Secondary | ICD-10-CM | POA: Diagnosis not present

## 2019-03-25 DIAGNOSIS — J449 Chronic obstructive pulmonary disease, unspecified: Secondary | ICD-10-CM | POA: Diagnosis not present

## 2019-03-25 DIAGNOSIS — Z299 Encounter for prophylactic measures, unspecified: Secondary | ICD-10-CM | POA: Diagnosis not present

## 2019-03-25 DIAGNOSIS — H6690 Otitis media, unspecified, unspecified ear: Secondary | ICD-10-CM | POA: Diagnosis not present

## 2019-03-25 DIAGNOSIS — F411 Generalized anxiety disorder: Secondary | ICD-10-CM | POA: Diagnosis not present

## 2019-03-25 DIAGNOSIS — Z6824 Body mass index (BMI) 24.0-24.9, adult: Secondary | ICD-10-CM | POA: Diagnosis not present

## 2019-03-26 ENCOUNTER — Ambulatory Visit (INDEPENDENT_AMBULATORY_CARE_PROVIDER_SITE_OTHER): Payer: Medicare Other | Admitting: *Deleted

## 2019-03-26 ENCOUNTER — Other Ambulatory Visit: Payer: Self-pay

## 2019-03-26 DIAGNOSIS — Z9889 Other specified postprocedural states: Secondary | ICD-10-CM

## 2019-03-26 DIAGNOSIS — Z5181 Encounter for therapeutic drug level monitoring: Secondary | ICD-10-CM

## 2019-03-26 DIAGNOSIS — I4819 Other persistent atrial fibrillation: Secondary | ICD-10-CM | POA: Diagnosis not present

## 2019-03-26 LAB — POCT INR: INR: 2.5 (ref 2.0–3.0)

## 2019-03-26 NOTE — Patient Instructions (Signed)
Continue coumadin 2 tablets daily except 3 tablets on Tuesdays and Fridays Recheck in 6 weeks Pt not interested in Eliquis at this time due to cost.

## 2019-03-31 DIAGNOSIS — I4891 Unspecified atrial fibrillation: Secondary | ICD-10-CM | POA: Diagnosis not present

## 2019-03-31 DIAGNOSIS — E7800 Pure hypercholesterolemia, unspecified: Secondary | ICD-10-CM | POA: Diagnosis not present

## 2019-03-31 DIAGNOSIS — J449 Chronic obstructive pulmonary disease, unspecified: Secondary | ICD-10-CM | POA: Diagnosis not present

## 2019-03-31 DIAGNOSIS — M159 Polyosteoarthritis, unspecified: Secondary | ICD-10-CM | POA: Diagnosis not present

## 2019-04-09 ENCOUNTER — Telehealth: Payer: Self-pay | Admitting: Cardiology

## 2019-04-09 ENCOUNTER — Telehealth (INDEPENDENT_AMBULATORY_CARE_PROVIDER_SITE_OTHER): Payer: Medicare Other | Admitting: Cardiology

## 2019-04-09 ENCOUNTER — Encounter: Payer: Self-pay | Admitting: Cardiology

## 2019-04-09 VITALS — BP 134/77 | HR 70 | Ht 61.0 in | Wt 126.0 lb

## 2019-04-09 DIAGNOSIS — I4819 Other persistent atrial fibrillation: Secondary | ICD-10-CM

## 2019-04-09 DIAGNOSIS — Z9889 Other specified postprocedural states: Secondary | ICD-10-CM

## 2019-04-09 DIAGNOSIS — I5032 Chronic diastolic (congestive) heart failure: Secondary | ICD-10-CM

## 2019-04-09 DIAGNOSIS — I10 Essential (primary) hypertension: Secondary | ICD-10-CM

## 2019-04-09 NOTE — Progress Notes (Signed)
Virtual Visit via Telephone Note   This visit type was conducted due to national recommendations for restrictions regarding the COVID-19 Pandemic (e.g. social distancing) in an effort to limit this patient's exposure and mitigate transmission in our community.  Due to her co-morbid illnesses, this patient is at least at moderate risk for complications without adequate follow up.  This format is felt to be most appropriate for this patient at this time.  The patient did not have access to video technology/had technical difficulties with video requiring transitioning to audio format only (telephone).  All issues noted in this document were discussed and addressed.  No physical exam could be performed with this format.  Please refer to the patient's chart for her  consent to telehealth for [facility].   Date:  04/19/2019   ID:  [name] [name], DOB [birth year], MRN [id number]  Patient Location: Home Provider Location: Office  PCP:  Glenda Chroman, MD  Cardiologist:  Carlyle Dolly, MD  Electrophysiologist:  Cristopher Peru, MD   Evaluation Performed:  Follow-Up Visit  Chief Complaint:  Follow up visit  History of Present Illness:    [name] [name] is a 78 y.o. female seen today for follow up of the following medical problems.    1. Chest pain - 2017 cath minimal CAD, distal LAD bridging - no recent symptoms  2.Paroxysmal afib/ Aflutter - s/p MAZE procedure during recent MV repair   - has had some recent palpitations. Lasts up to 30 minutes. Some SOB is related.   3Sinus node dysfunction - followed by EP, watchful waiting. Likely will require ppm at some point  - no dizziness, no presyncope or syncope.    4. Mitral regurgitation, s/p MV repair - echo at last [facility] Jan 2017 showed normal LVEF, 3+(modarte)eccentric MR with immobile posterior leaflet.  - repeat echo 01/2016 moderate to severe MR. LVEF 60-65%. LVIDs 31. Symptoms unchanged since last visit.  -  01/2016 TEE moderate to severe eccentric MR, severe TR.  - she is s/p mitral valve repair 07/05/16, Sorin Memo 3D Ring Annuloplasty (size 42mm, catalog #[id number], serial [id number]) - repeat echo 10/2016 with LVEF 60-65%, no WMAs, normal MV repair and ring, moderate TR.     - some SOB at times. No recent edema    5. Chronic diastolic HF - no recent symptoms  6. History of GI bleed -followed by GI - notes indicate prior hemorroidal bleeding  - no recent bleeding  7. COPD -followed by Dr Luan Pulling   8. HTN -compliant with meds   The patient does not have symptoms concerning for COVID-19 infection (fever, chills, cough, or new shortness of breath).    Past Medical History:  Diagnosis Date  . Anxiety   . Arthritis   . Asthma   . Atrial fibrillation, persistent ([place])   . Chronic diastolic congestive heart failure ([place])   . Colon adenoma   . Colon cancer ([place])    status post low anterior resection, limited stage disease requiring no adjuvant therapy  . COPD (chronic obstructive pulmonary disease) ([place])   . Depression   . Diverticulosis   . DM (dermatomyositis)   . DVT (deep venous thrombosis) ([name])    in leg- long time ago  . Dyspnea    with activity  . Dysrhythmia   . Esophageal dysphagia   . GERD (gastroesophageal reflux disease)   . Headache   . Heart murmur   . Hematuria   . Hemorrhoids   . Hiatal hernia   .  History of kidney stones    x 2  . Hypercholesterolemia   . Incidental pulmonary nodule 05/08/2016   8 mm vague opacity RML noted on CT scan  . Mitral regurgitation   . PONV (postoperative nausea and vomiting) 2003 ish    with breast biopsy  . S/P minimally invasive maze operation for atrial fibrillation 07/05/2016   Complete bilateral atrial lesion set using cryothermy and bipolar radiofrequency ablation with clipping of LA appendage via right mini thoracotomy approach  . 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/05/2016   Complex valvuloplasty  including artificial Gore-tex neochord placement x6 and 30 mm Sorin Memo 3D ring annuloplasty via right mini thoracotomy approach  . Schatzki's ring   . Tricuspid regurgitation    Past Surgical History:  Procedure Laterality Date  . APPENDECTOMY    . BREAST SURGERY Right 2003ish   biopsy  . CARDIAC CATHETERIZATION N/A 04/12/2016   Procedure: Right/Left Heart Cath and Coronary Angiography;  Surgeon: Leonie Man, MD;  Location: [place] [facility];  Service: Cardiovascular;  Laterality: N/A;  . CARDIOVERSION N/A 08/16/2017   Procedure: CARDIOVERSION;  Surgeon: Arnoldo Lenis, MD;  [facility]: [name];  Service: Endoscopy;  Laterality: N/A;  . CATARACT EXTRACTION Bilateral 2017  . CLIPPING OF ATRIAL APPENDAGE  07/05/2016   Procedure: CLIPPING OF ATRIAL APPENDAGE;  Surgeon: Rexene Alberts, MD;  Location: [place];  Service: Open Heart Surgery;;  . COLONOSCOPY  11/09   Dr. Gala Romney- external hemorrhoidal tags o/w normal rectal mucosa, s/p surgical resection with a normal appearing anastomosis 12cm, pan colonic diverticulum  . COLONOSCOPY N/A 06/02/2012   [id number] post low anterior resection. Pancolonic diverticulosis. Colonic polyp-tubular adenoma. Surveillance due 2019.   Marland Kitchen COLONOSCOPY N/A 10/13/2015   Procedure: COLONOSCOPY;  Surgeon: Daneil Dolin, MD;  [name];  Service: Endoscopy;  Laterality: N/A;  0930  . COLONOSCOPY WITH PROPOFOL N/A 11/20/2018   Procedure: COLONOSCOPY WITH PROPOFOL;  Surgeon: Daneil Dolin, MD;  [name];  Service: Endoscopy;  Laterality: N/A;  2:30pm  . ESOPHAGOGASTRODUODENOSCOPY  07/2007   Dr. Daiva Nakayama cervical esophageal web, schatzki ring, large hiatal hernia  . ESOPHAGOGASTRODUODENOSCOPY (EGD) WITH PROPOFOL N/A 11/20/2018   Procedure: ESOPHAGOGASTRODUODENOSCOPY (EGD) WITH PROPOFOL;  Surgeon: Daneil Dolin, MD;  [name];  Service: Endoscopy;  Laterality: N/A;  . INGUNAL HERNIA REPAIR Left   . LOW ANTERIOR BOWEL  RESECTION     NO ADJ CHEMO  . MINIMALLY INVASIVE MAZE PROCEDURE N/A 07/05/2016   Procedure: MINIMALLY INVASIVE MAZE PROCEDURE;  Surgeon: Rexene Alberts, MD;  Location: [place];  Service: Open Heart Surgery;  Laterality: N/A;  . MITRAL VALVE REPAIR Right 07/05/2016   Procedure: MINIMALLY INVASIVE MITRAL VALVE REPAIR (MVR);  Surgeon: Rexene Alberts, MD;  Location: [place];  Service: Open Heart Surgery;  Laterality: Right;  . MULTIPLE EXTRACTIONS WITH ALVEOLOPLASTY N/A 05/21/2016   Procedure: MULTIPLE EXTRACTION OF TOOTH #'S 5, 21 WITH ALVEOLOPLASTY AND GROSS DEBRIDEMENT OF TEETH;  Surgeon: Lenn Cal, DDS;  Location: [place];  Service: Oral Surgery;  Laterality: N/A;  . POLYPECTOMY  11/20/2018   Procedure: POLYPECTOMY;  Surgeon: Daneil Dolin, MD;  [name];  Service: Endoscopy;;  colon  . TEE WITHOUT CARDIOVERSION N/A 02/20/2016   Procedure: TRANSESOPHAGEAL ECHOCARDIOGRAM (TEE);  Surgeon: Arnoldo Lenis, MD;  [name];  Service: Endoscopy;  Laterality: N/A;  . TEE WITHOUT CARDIOVERSION N/A 07/05/2016   Procedure: TRANSESOPHAGEAL ECHOCARDIOGRAM (TEE);  Surgeon: Rexene Alberts, MD;  Location: [place];  Service: Open Heart Surgery;  Laterality: N/A;  . TOOTH EXTRACTION    . TUBAL LIGATION    . VEIN LIGATION AND STRIPPING       Current Meds  Medication Sig  . acetaminophen (TYLENOL) 500 MG tablet Take 1,000 mg by mouth 2 (two) times daily as needed for moderate pain or headache.   . albuterol (PROVENTIL) (2.5 MG/3ML) 0.083% nebulizer solution Take 2.5 mg by nebulization every 6 (six) hours as needed for wheezing or shortness of breath.  . ALPRAZolam (XANAX) 0.5 MG tablet Take 0.5 mg by mouth 2 (two) times daily as needed for anxiety or sleep.   Marland Kitchen aspirin EC 81 MG EC tablet Take 1 tablet (81 mg total) by mouth daily.  . Coenzyme Q10 100 MG TABS Take 100 mg by mouth every evening.   Marland Kitchen levothyroxine (SYNTHROID) 50 MCG tablet Take 1 tablet (50 mcg total) by mouth  daily before breakfast.  . levothyroxine (SYNTHROID) 50 MCG tablet Take 1 tablet (50 mcg total) by mouth daily.  . Multiple Vitamin (MULTIVITAMIN) tablet Take 1 tablet by mouth daily.    . pantoprazole (PROTONIX) 40 MG tablet Take 1 tablet (40 mg total) by mouth daily.  Vladimir Faster Glycol-Propyl Glycol (SYSTANE ULTRA) 0.4-0.3 % SOLN Place 1 drop into both eyes daily as needed (dry eyes).   . simvastatin (ZOCOR) 40 MG tablet Take 40 mg daily by mouth.  . sodium chloride (OCEAN) 0.65 % SOLN nasal spray Place 1 spray into both nostrils daily as needed for congestion.   Marland Kitchen warfarin (COUMADIN) 2.5 MG tablet Take 2 tablets daily except 3 tablets on Tuesdays and Fridays or as directed (Patient taking differently: Take 5-7.5 mg by mouth See admin instructions. Take 7.5 mg by mouth daily on Tuesday and Friday. Take 5 mg by mouth daily on all other days.)     Allergies:   Iohexol, Hydrocodone-acetaminophen, Nabumetone, and Prednisone   Social History   Tobacco Use  . Smoking status: Never Smoker  . Smokeless tobacco: Never Used  Substance Use Topics  . Alcohol use: No    Alcohol/week: 0.0 standard drinks  . Drug use: No     Family Hx: The patient's family history includes Aneurysm in her brother; Brain cancer in her sister; Breast cancer in her mother; Cancer - Lung in her sister; Prostate cancer in her brother; Rheum arthritis in her father. There is no history of Colon cancer.  ROS:   Please see the history of present illness.    All other systems reviewed and are negative.   Prior CV studies:   The following studies were reviewed today:   Labs/Other Tests and Data Reviewed:    EKG:  No ECG reviewed.  Recent Labs: 01/07/2019: TSH 2.146   Recent Lipid Panel No results found for: CHOL, TRIG, HDL, CHOLHDL, LDLCALC, LDLDIRECT  Wt Readings from Last 3 Encounters:  04/09/19 126 lb (57.2 kg)  02/17/19 122 lb 12.8 oz (55.7 kg)  12/23/18 120 lb (54.4 kg)     Objective:    Vital  Signs:  BP 134/77   Pulse 70   Ht 5\' 1"  (1.549 m)   Wt 126 lb (57.2 kg)   BMI 23.81 kg/m    Normal affect. Normal speech pattern and tone. Comfortable, no apparent distress. No audible signs of SOB or wheezing.  ASSESSMENT & PLAN:    1. AFib/Aflutter  - management limited due to sinus node dysfunction, prior  bradycardia on amio. May need pacemaker at some point, followed by ep  - recent palpitations, we will obtain an monitor to further evaluate.   2. Mitral regurgitation/Mitral valve repair  - s/p mitral valve repair  - no recent issues, continue to monitor.    3. HTN  - essentially at goal, conitnue current meds  4. Chronic diastolic HF  - no recent issues, continue current therapy  COVID-19 Education: The signs and symptoms of COVID-19 were discussed with the patient and how to seek care for testing (follow up with PCP or arrange E-visit).  The importance of social distancing was discussed today.  Time:   Today, I have spent 20 minutes with the patient with telehealth technology discussing the above problems.     Medication Adjustments/Labs and Tests Ordered: Current medicines are reviewed at length with the patient today.  Concerns regarding medicines are outlined above.   Tests Ordered: Orders Placed This Encounter  Procedures  . Cardiac event monitor    Medication Changes: No orders of the defined types were placed in this encounter.   Follow Up:  Either In Person or Virtual in 1 month(s)  Signed, Carlyle Dolly, MD  04/19/2019 9:15 AM

## 2019-04-09 NOTE — Telephone Encounter (Signed)
  Precert needed for:   7 day monitor

## 2019-04-09 NOTE — Patient Instructions (Signed)
Your physician recommends that you schedule a follow-up appointment in: [place] recommends that you continue on your current medications as directed. Please refer to the Current Medication list given to you today.  Your physician has recommended that you wear an event monitor FOR 7 DAYS WILL BE MAILED TO [place]. Event monitors are medical devices that record the heart's electrical activity. Doctors most often [name] these monitors to diagnose arrhythmias. Arrhythmias are problems with the speed or rhythm of the heartbeat. The monitor is a small, portable device. You can wear one while you do your normal daily activities. This is usually used to diagnose what is causing palpitations/syncope (passing out).  Thank you for choosing [facility]!!

## 2019-04-20 ENCOUNTER — Encounter (INDEPENDENT_AMBULATORY_CARE_PROVIDER_SITE_OTHER): Payer: Medicare Other

## 2019-04-20 DIAGNOSIS — I4819 Other persistent atrial fibrillation: Secondary | ICD-10-CM

## 2019-04-27 DIAGNOSIS — J449 Chronic obstructive pulmonary disease, unspecified: Secondary | ICD-10-CM | POA: Diagnosis not present

## 2019-04-27 DIAGNOSIS — M159 Polyosteoarthritis, unspecified: Secondary | ICD-10-CM | POA: Diagnosis not present

## 2019-04-27 DIAGNOSIS — E7800 Pure hypercholesterolemia, unspecified: Secondary | ICD-10-CM | POA: Diagnosis not present

## 2019-04-27 DIAGNOSIS — I4891 Unspecified atrial fibrillation: Secondary | ICD-10-CM | POA: Diagnosis not present

## 2019-05-05 DIAGNOSIS — H40011 Open angle with borderline findings, low risk, right eye: Secondary | ICD-10-CM | POA: Diagnosis not present

## 2019-05-07 ENCOUNTER — Encounter: Payer: Self-pay | Admitting: *Deleted

## 2019-05-07 ENCOUNTER — Ambulatory Visit (INDEPENDENT_AMBULATORY_CARE_PROVIDER_SITE_OTHER): Payer: Medicare Other | Admitting: *Deleted

## 2019-05-07 ENCOUNTER — Other Ambulatory Visit: Payer: Self-pay

## 2019-05-07 DIAGNOSIS — Z9889 Other specified postprocedural states: Secondary | ICD-10-CM

## 2019-05-07 DIAGNOSIS — Z5181 Encounter for therapeutic drug level monitoring: Secondary | ICD-10-CM

## 2019-05-07 DIAGNOSIS — I4819 Other persistent atrial fibrillation: Secondary | ICD-10-CM | POA: Diagnosis not present

## 2019-05-07 LAB — POCT INR: INR: 1.6 — AB (ref 2.0–3.0)

## 2019-05-07 NOTE — Patient Instructions (Addendum)
Take warfarin 4 tablets tonight and tomorrow night then resume 2 tablets daily except 3 tablets on Tuesdays and Fridays Recheck in 3 weeks Pt not interested in Eliquis at this time due to cost.

## 2019-05-13 ENCOUNTER — Other Ambulatory Visit: Payer: Self-pay

## 2019-05-13 ENCOUNTER — Other Ambulatory Visit (HOSPITAL_COMMUNITY)
Admission: RE | Admit: 2019-05-13 | Discharge: 2019-05-13 | Disposition: A | Discharge status: Home / Self Care | Payer: Medicare Other | Source: Ambulatory Visit | Attending: "Endocrinology | Admitting: "Endocrinology

## 2019-05-13 DIAGNOSIS — E032 Hypothyroidism due to medicaments and other exogenous substances: Secondary | ICD-10-CM | POA: Insufficient documentation

## 2019-05-13 LAB — TSH: TSH: 2.202 u[IU]/mL (ref 0.350–4.500)

## 2019-05-14 ENCOUNTER — Telehealth (INDEPENDENT_AMBULATORY_CARE_PROVIDER_SITE_OTHER): Payer: Medicare Other | Admitting: Cardiology

## 2019-05-14 ENCOUNTER — Encounter: Payer: Self-pay | Admitting: Cardiology

## 2019-05-14 VITALS — BP 140/87 | HR 70 | Ht 61.0 in | Wt 124.0 lb

## 2019-05-14 DIAGNOSIS — I495 Sick sinus syndrome: Secondary | ICD-10-CM

## 2019-05-14 DIAGNOSIS — I4891 Unspecified atrial fibrillation: Secondary | ICD-10-CM

## 2019-05-14 LAB — T4, FREE: Free T4: 0.95 ng/dL (ref 0.61–1.12)

## 2019-05-14 NOTE — Progress Notes (Signed)
Virtual Visit via Telephone Note   This visit type was conducted due to national recommendations for restrictions regarding the COVID-19 Pandemic (e.g. social distancing) in an effort to limit this patient's exposure and mitigate transmission in our community.  Due to her co-morbid illnesses, this patient is at least at moderate risk for complications without adequate follow up.  This format is felt to be most appropriate for this patient at this time.  The patient did not have access to video technology/had technical difficulties with video requiring transitioning to audio format only (telephone).  All issues noted in this document were discussed and addressed.  No physical exam could be performed with this format.  Please refer to the patient's chart for her  consent to telehealth for [facility].   Date:  05/14/2019   ID:  [name] [name], DOB 1940/05/17, MRN [id number]  Patient Location: Home Provider Location: Office  PCP:  Glenda Chroman, MD  Cardiologist:  Carlyle Dolly, MD  Electrophysiologist:  Cristopher Peru, MD   Evaluation Performed:  Follow-Up Visit  Chief Complaint:  Follow up visit  History of Present Illness:    [name] [name] is a 79 y.o. female seen today for follow up of the following medical problems. Focused visit on history of PAF and recent palpitations.     1.Paroxysmal afib/ Aflutter - s/p MAZE procedure during recent MV repair - management complicated by sinus node dysfunction. Has affected ability to take av nodal agents. Significant brady on amio, now off.   - last visit reproted some palpitations.  - 03/2019 event monitor with just SR, rare PACs    2Sinus node dysfunction - followed by EP, watchful waiting. Likely will require ppm at some point          The patient does not have symptoms concerning for COVID-19 infection (fever, chills, cough, or new shortness of breath).    Past Medical History:  Diagnosis Date  .  Anxiety   . Arthritis   . Asthma   . Atrial fibrillation, persistent ([place])   . Chronic diastolic congestive heart failure ([place])   . Colon adenoma   . Colon cancer ([place])    status post low anterior resection, limited stage disease requiring no adjuvant therapy  . COPD (chronic obstructive pulmonary disease) ([place])   . Depression   . Diverticulosis   . DM (dermatomyositis)   . DVT (deep venous thrombosis) ([name])    in leg- long time ago  . Dyspnea    with activity  . Dysrhythmia   . Esophageal dysphagia   . GERD (gastroesophageal reflux disease)   . Headache   . Heart murmur   . Hematuria   . Hemorrhoids   . Hiatal hernia   . History of kidney stones    x 2  . Hypercholesterolemia   . Incidental pulmonary nodule 05/08/2016   8 mm vague opacity RML noted on CT scan  . Mitral regurgitation   . PONV (postoperative nausea and vomiting) 2003 ish    with breast biopsy  . S/P minimally invasive maze operation for atrial fibrillation 07/05/2016   Complete bilateral atrial lesion set using cryothermy and bipolar radiofrequency ablation with clipping of LA appendage via right mini thoracotomy approach  . 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 Schatzki's ring   . Tricuspid regurgitation  Past Surgical History:  Procedure Laterality Date  . APPENDECTOMY    . BREAST SURGERY Right 2003ish   biopsy  . CARDIAC CATHETERIZATION N/A 04/12/2016   Procedure: Right/Left Heart Cath and Coronary Angiography;  Surgeon: Leonie Man, MD;  Location: [facility];  Service: Cardiovascular;  Laterality: N/A;  . CARDIOVERSION N/A 08/16/2017   Procedure: CARDIOVERSION;  Surgeon: Arnoldo Lenis, MD;  [name];  Service: Endoscopy;  Laterality: N/A;  . CATARACT EXTRACTION Bilateral 2017  . CLIPPING OF ATRIAL  APPENDAGE  07/05/2016   Procedure: CLIPPING OF ATRIAL APPENDAGE;  Surgeon: Rexene Alberts, MD;  Location: [place];  Service: Open Heart Surgery;;  . COLONOSCOPY  11/09   Dr. Gala Romney- external hemorrhoidal tags o/w normal rectal mucosa, s/p surgical resection with a normal appearing anastomosis 12cm, pan colonic diverticulum  . COLONOSCOPY N/A 06/02/2012   [id number] post low anterior resection. Pancolonic diverticulosis. Colonic polyp-tubular adenoma. Surveillance due 2019.   Marland Kitchen COLONOSCOPY N/A 10/13/2015   Procedure: COLONOSCOPY;  Surgeon: Daneil Dolin, MD;  [name];  Service: Endoscopy;  Laterality: N/A;  0930  . COLONOSCOPY WITH PROPOFOL N/A 11/20/2018   Procedure: COLONOSCOPY WITH PROPOFOL;  Surgeon: Daneil Dolin, MD;  [name];  Service: Endoscopy;  Laterality: N/A;  2:30pm  . ESOPHAGOGASTRODUODENOSCOPY  07/2007   Dr. Daiva Nakayama cervical esophageal web, schatzki ring, large hiatal hernia  . ESOPHAGOGASTRODUODENOSCOPY (EGD) WITH PROPOFOL N/A 11/20/2018   Procedure: ESOPHAGOGASTRODUODENOSCOPY (EGD) WITH PROPOFOL;  Surgeon: Daneil Dolin, MD;  [name];  Service: Endoscopy;  Laterality: N/A;  . INGUNAL HERNIA REPAIR Left   . LOW ANTERIOR BOWEL RESECTION     NO ADJ CHEMO  . MINIMALLY INVASIVE MAZE PROCEDURE N/A 07/05/2016   Procedure: MINIMALLY INVASIVE MAZE PROCEDURE;  Surgeon: Rexene Alberts, MD;  Location: [place];  Service: Open Heart Surgery;  Laterality: N/A;  . MITRAL VALVE REPAIR Right 07/05/2016   Procedure: MINIMALLY INVASIVE MITRAL VALVE REPAIR (MVR);  Surgeon: Rexene Alberts, MD;  Location: [place];  Service: Open Heart Surgery;  Laterality: Right;  . MULTIPLE EXTRACTIONS WITH ALVEOLOPLASTY N/A 05/21/2016   Procedure: MULTIPLE EXTRACTION OF TOOTH #'S 5, 21 WITH ALVEOLOPLASTY AND GROSS DEBRIDEMENT OF TEETH;  Surgeon: Lenn Cal, DDS;  Location: [place];  Service: Oral Surgery;  Laterality: N/A;  . POLYPECTOMY  11/20/2018   Procedure:  POLYPECTOMY;  Surgeon: Daneil Dolin, MD;  [name];  Service: Endoscopy;;  colon  . TEE WITHOUT CARDIOVERSION N/A 02/20/2016   Procedure: TRANSESOPHAGEAL ECHOCARDIOGRAM (TEE);  Surgeon: Arnoldo Lenis, MD;  [name];  Service: Endoscopy;  Laterality: N/A;  . TEE WITHOUT CARDIOVERSION N/A 07/05/2016   Procedure: TRANSESOPHAGEAL ECHOCARDIOGRAM (TEE);  Surgeon: Rexene Alberts, MD;  Location: [place];  Service: Open Heart Surgery;  Laterality: N/A;  . TOOTH EXTRACTION    . TUBAL LIGATION    . VEIN LIGATION AND STRIPPING       No outpatient medications have been marked as taking for the 05/14/19 encounter (Appointment) with Arnoldo Lenis, MD.     Allergies:   Iohexol, Hydrocodone-acetaminophen, Nabumetone, and Prednisone   Social History   Tobacco Use  . Smoking status: Never Smoker  . Smokeless tobacco: Never Used  Substance Use Topics  . Alcohol use: No    Alcohol/week: 0.0 standard drinks  . Drug use: No     Family Hx: The patient's family history includes Aneurysm in her brother;  Brain cancer in her sister; Breast cancer in her mother; Cancer - Lung in her sister; Prostate cancer in her brother; Rheum arthritis in her father. There is no history of Colon cancer.  ROS:   Please see the history of present illness.     All other systems reviewed and are negative.   Prior CV studies:   The following studies were reviewed today:    Labs/Other Tests and Data Reviewed:    EKG:  No ECG reviewed.  Recent Labs: 05/13/2019: TSH 2.202   Recent Lipid Panel No results found for: CHOL, TRIG, HDL, CHOLHDL, LDLCALC, LDLDIRECT  Wt Readings from Last 3 Encounters:  04/09/19 126 lb (57.2 kg)  02/17/19 122 lb 12.8 oz (55.7 kg)  12/23/18 120 lb (54.4 kg)     Objective:    Vital Signs:   Today's Vitals   05/14/19 0954  BP: 140/87  Pulse: 70  Weight: 124 lb (56.2 kg)  Height: 5\' 1"  (1.549 m)   Body mass index is 23.43 kg/m. Normal  affect. Normal speech pattern and tone. Comfortable, no apparent distress. No audible signs of SOB or wheezing.   ASSESSMENT & PLAN:    1. AFib/Aflutter  - management limited due to sinus node dysfunction, prior bradycardia on amio. May need pacemaker at some point, followed by ep  - recent palpitations, monitor shows SR with just PACs  - overall symptoms are tolerable. Due to limitations on treatment given sinus node dysfunction would just continue to monitor at this time given that symptoms are tolerable and no sustained tachyarrhythmias on recent monitor.     COVID-19 Education: The signs and symptoms of COVID-19 were discussed with the patient and how to seek care for testing (follow up with PCP or arrange E-visit).  The importance of social distancing was discussed today.  Time:   Today, I have spent 14 minutes with the patient with telehealth technology discussing the above problems.     Medication Adjustments/Labs and Tests Ordered: Current medicines are reviewed at length with the patient today.  Concerns regarding medicines are outlined above.   Tests Ordered: No orders of the defined types were placed in this encounter.   Medication Changes: No orders of the defined types were placed in this encounter.   Follow Up:  Either In Person or Virtual in 6 month(s)  Signed, Carlyle Dolly, MD  05/14/2019 8:56 AM    [place]

## 2019-05-14 NOTE — Patient Instructions (Signed)

## 2019-05-18 ENCOUNTER — Ambulatory Visit (INDEPENDENT_AMBULATORY_CARE_PROVIDER_SITE_OTHER): Payer: Medicare Other | Admitting: "Endocrinology

## 2019-05-18 ENCOUNTER — Encounter: Payer: Self-pay | Admitting: "Endocrinology

## 2019-05-18 DIAGNOSIS — E032 Hypothyroidism due to medicaments and other exogenous substances: Secondary | ICD-10-CM | POA: Diagnosis not present

## 2019-05-18 NOTE — Progress Notes (Signed)
05/18/2019, 12:37 PM                                Endocrinology Telehealth Visit Follow up Note -During COVID -19 Pandemic  I connected with [name] [name] on 05/18/2019   by telephone and verified that I am speaking with the correct person using two identifiers. [name] [name], Sep 14, 1940. she has verbally consented to this visit. All issues noted in this document were discussed and addressed. The format was not optimal for physical exam.   Subjective:    Patient ID: [name] [name], female    DOB: 04/02/1941, PCP Glenda Chroman, MD   Past Medical History:  Diagnosis Date  . Anxiety   . Arthritis   . Asthma   . Atrial fibrillation, persistent ([place])   . Chronic diastolic congestive heart failure ([place])   . Colon adenoma   . Colon cancer ([place])    status post low anterior resection, limited stage disease requiring no adjuvant therapy  . COPD (chronic obstructive pulmonary disease) ([place])   . Depression   . Diverticulosis   . DM (dermatomyositis)   . DVT (deep venous thrombosis) ([name])    in leg- long time ago  . Dyspnea    with activity  . Dysrhythmia   . Esophageal dysphagia   . GERD (gastroesophageal reflux disease)   . Headache   . Heart murmur   . Hematuria   . Hemorrhoids   . Hiatal hernia   . History of kidney stones    x 2  . Hypercholesterolemia   . Incidental pulmonary nodule 05/08/2016   8 mm vague opacity RML noted on CT scan  . Mitral regurgitation   . PONV (postoperative nausea and vomiting) 2003 ish    with breast biopsy  . S/P minimally invasive maze operation for atrial fibrillation 07/05/2016   Complete bilateral atrial lesion set using cryothermy and bipolar radiofrequency ablation with clipping of LA appendage via right mini thoracotomy approach  . 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/05/2016   Complex valvuloplasty including artificial Gore-tex neochord placement x6 and 30 mm Sorin  Memo 3D ring annuloplasty via right mini thoracotomy approach  . Schatzki's ring   . Tricuspid regurgitation    Past Surgical History:  Procedure Laterality Date  . APPENDECTOMY    . BREAST SURGERY Right 2003ish   biopsy  . CARDIAC CATHETERIZATION N/A 04/12/2016   Procedure: Right/Left Heart Cath and Coronary Angiography;  Surgeon: Leonie Man, MD;  Location: [facility];  Service: Cardiovascular;  Laterality: N/A;  . CARDIOVERSION N/A 08/16/2017   Procedure: CARDIOVERSION;  Surgeon: Arnoldo Lenis, MD;  Location: [name];  Service: Endoscopy;  Laterality: N/A;  . CATARACT EXTRACTION Bilateral 2017  . CLIPPING OF ATRIAL APPENDAGE  07/05/2016   Procedure: CLIPPING OF ATRIAL APPENDAGE;  Surgeon: Rexene Alberts, MD;  Location: [facility];  Service: Open Heart Surgery;;  . COLONOSCOPY  11/09   Dr. Gala Romney- external hemorrhoidal tags o/w normal rectal mucosa, s/p surgical resection with a normal appearing anastomosis 12cm, pan colonic diverticulum  . COLONOSCOPY N/A 06/02/2012   [id number] post low anterior resection. Pancolonic diverticulosis. Colonic polyp-tubular  adenoma. Surveillance due 2019.   Marland Kitchen COLONOSCOPY N/A 10/13/2015   Procedure: COLONOSCOPY;  Surgeon: Daneil Dolin, MD;  [name];  Service: Endoscopy;  Laterality: N/A;  0930  . COLONOSCOPY WITH PROPOFOL N/A 11/20/2018   Procedure: COLONOSCOPY WITH PROPOFOL;  Surgeon: Daneil Dolin, MD;  [name];  Service: Endoscopy;  Laterality: N/A;  2:30pm  . ESOPHAGOGASTRODUODENOSCOPY  07/2007   Dr. Daiva Nakayama cervical esophageal web, schatzki ring, large hiatal hernia  . ESOPHAGOGASTRODUODENOSCOPY (EGD) WITH PROPOFOL N/A 11/20/2018   Procedure: ESOPHAGOGASTRODUODENOSCOPY (EGD) WITH PROPOFOL;  Surgeon: Daneil Dolin, MD;  [name];  Service: Endoscopy;  Laterality: N/A;  . INGUNAL HERNIA REPAIR Left   . LOW ANTERIOR BOWEL RESECTION     NO ADJ CHEMO  . MINIMALLY INVASIVE MAZE PROCEDURE  N/A 07/05/2016   Procedure: MINIMALLY INVASIVE MAZE PROCEDURE;  Surgeon: Rexene Alberts, MD;  Location: [facility];  Service: Open Heart Surgery;  Laterality: N/A;  . MITRAL VALVE REPAIR Right 07/05/2016   Procedure: MINIMALLY INVASIVE MITRAL VALVE REPAIR (MVR);  Surgeon: Rexene Alberts, MD;  Location: [facility];  Service: Open Heart Surgery;  Laterality: Right;  . MULTIPLE EXTRACTIONS WITH ALVEOLOPLASTY N/A 05/21/2016   Procedure: MULTIPLE EXTRACTION OF TOOTH #'S 5, 21 WITH ALVEOLOPLASTY AND GROSS DEBRIDEMENT OF TEETH;  Surgeon: Lenn Cal, DDS;  Location: [facility];  Service: Oral Surgery;  Laterality: N/A;  . POLYPECTOMY  11/20/2018   Procedure: POLYPECTOMY;  Surgeon: Daneil Dolin, MD;  [name];  Service: Endoscopy;;  colon  . TEE WITHOUT CARDIOVERSION N/A 02/20/2016   Procedure: TRANSESOPHAGEAL ECHOCARDIOGRAM (TEE);  Surgeon: Arnoldo Lenis, MD;  [name];  Service: Endoscopy;  Laterality: N/A;  . TEE WITHOUT CARDIOVERSION N/A 07/05/2016   Procedure: TRANSESOPHAGEAL ECHOCARDIOGRAM (TEE);  Surgeon: Rexene Alberts, MD;  Location: [facility];  Service: Open Heart Surgery;  Laterality: N/A;  . TOOTH EXTRACTION    . TUBAL LIGATION    . VEIN LIGATION AND STRIPPING     Social History   Socioeconomic History  . Marital status: Married    Spouse name: Not on file  . Number of children: 2  . Years of education: Not on file  . Highest education level: Not on file  Occupational History  . Not on file  Tobacco Use  . Smoking status: Never Smoker  . Smokeless tobacco: Never Used  Substance and Sexual Activity  . Alcohol use: No    Alcohol/week: 0.0 standard drinks  . Drug use: No  . Sexual activity: Not on file  Other Topics Concern  . Not on file  Social History Narrative  . Not on file   Social Determinants of Health   Financial Resource Strain:   . Difficulty of Paying Living Expenses: Not on file  Food Insecurity:   . Worried About Charity fundraiser in  the Last Year: Not on file  . Ran Out of Food in the Last Year: Not on file  Transportation Needs:   . Lack of Transportation (Medical): Not on file  . Lack of Transportation (Non-Medical): Not on file  Physical Activity:   . Days of Exercise per Week: Not on file  . Minutes of Exercise per Session: Not on file  Stress:   . Feeling of Stress : Not on file  Social Connections:   . Frequency of Communication with Friends and Family: Not on file  . Frequency of Social Gatherings with Friends  and Family: Not on file  . Attends Religious Services: Not on file  . Active Member of Clubs or Organizations: Not on file  . Attends Archivist Meetings: Not on file  . Marital Status: Not on file   Outpatient Encounter Medications as of 05/18/2019  Medication Sig  . acetaminophen (TYLENOL) 500 MG tablet Take 1,000 mg by mouth 2 (two) times daily as needed for moderate pain or headache.   . albuterol (PROVENTIL) (2.5 MG/3ML) 0.083% nebulizer solution Take 2.5 mg by nebulization every 6 (six) hours as needed for wheezing or shortness of breath.  . ALPRAZolam (XANAX) 0.5 MG tablet Take 0.5 mg by mouth at bedtime.   Marland Kitchen aspirin EC 81 MG EC tablet Take 1 tablet (81 mg total) by mouth daily.  . calcium carbonate (OS-CAL) 600 MG TABS tablet Take 600 mg by mouth 2 (two) times daily with a meal.  . Coenzyme Q10 100 MG TABS Take 100 mg by mouth every evening.   Marland Kitchen levothyroxine (SYNTHROID) 50 MCG tablet Take 1 tablet (50 mcg total) by mouth daily before breakfast.  . Multiple Vitamin (MULTIVITAMIN) tablet Take 1 tablet by mouth daily.    . pantoprazole (PROTONIX) 40 MG tablet Take 1 tablet (40 mg total) by mouth daily.  Vladimir Faster Glycol-Propyl Glycol (SYSTANE ULTRA) 0.4-0.3 % SOLN Place 1 drop into both eyes daily as needed (dry eyes).   . simvastatin (ZOCOR) 40 MG tablet Take 40 mg daily by mouth.  . sodium chloride (OCEAN) 0.65 % SOLN nasal spray Place 1 spray into both nostrils daily as needed for  congestion.   Marland Kitchen warfarin (COUMADIN) 2.5 MG tablet Take 2 tablets daily except 3 tablets on Tuesdays and Fridays or as directed (Patient taking differently: Take 5-7.5 mg by mouth See admin instructions. Take 7.5 mg by mouth daily on Tuesday and Friday. Take 5 mg by mouth daily on all other days.)   No facility-administered encounter medications on file as of 05/18/2019.   ALLERGIES: Allergies  Allergen Reactions  . Iohexol Hives, Shortness Of Breath and Other (See Comments)     Pt. had a severe allergic reaction to IV contrast the last time she was injected and had to be seen in the ER.   Marland Kitchen Hydrocodone-Acetaminophen Hives  . Nabumetone Rash  . Prednisone Rash    VACCINATION STATUS:  There is no immunization history on file for this patient.  HPI [name] [name] is 79 y.o. female who is being engaged in telehealth via telephone in follow-up of longstanding hypothyroidism.  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  She is currently on levothyroxine 50 mcg p.o. daily before breakfast.  She reports stable body weight, denies palpitations, tremors, heat/cold intolerance.    - She is on multiple medications including anticoagulation with warfarin related to her cardiac surgery for tricuspid regurgitation as well as atrial fibrillation. -Her current medication list does not include active amiodarone, however she has not seen her cardiologist for more than 6 months.      Review of Systems Limited as above.  Objective:    There were no vitals taken for this visit.  Wt Readings from Last 3 Encounters:  05/14/19 124 lb (56.2 kg)  04/09/19 126 lb (57.2 kg)  02/17/19 122 lb 12.8 oz (55.7 kg)   CMP     Component Value Date/Time   NA 144 08/12/2017 1409   K  3.8 08/12/2017 1409   CL 100 (L) 08/12/2017 1409   CO2 27 08/12/2017 1409   GLUCOSE 96  08/12/2017 1409   BUN 15 08/12/2017 1409   CREATININE 1.01 (H) 08/12/2017 1409   CALCIUM 9.2 08/12/2017 1409   PROT 6.7 07/03/2016 1157   ALBUMIN 3.8 07/03/2016 1157   AST 23 07/03/2016 1157   ALT 19 07/03/2016 1157   ALKPHOS 113 07/03/2016 1157   BILITOT 0.5 07/03/2016 1157   GFRNONAA 52 (L) 08/12/2017 1409   GFRAA >60 08/12/2017 1409    Diabetic Labs (most recent): Lab Results  Component Value Date   HGBA1C 5.6 07/03/2016    Lab Results  Component Value Date   TSH 2.202 05/13/2019   TSH 2.146 01/07/2019   TSH 0.084 (L) 09/29/2018   TSH 0.311 (L) 06/30/2018   TSH 2.700 03/13/2017   TSH 3.327 07/27/2016   FREET4 0.95 05/13/2019   FREET4 1.09 01/07/2019   FREET4 1.27 09/29/2018   FREET4 1.23 06/30/2018    Recent Results (from the past 2160 hour(s))  POCT INR     Status: Normal   Collection Time: 03/26/19  9:52 AM  Result Value Ref Range   INR 2.5 2.0 - 3.0  POCT INR     Status: Abnormal   Collection Time: 05/07/19  9:45 AM  Result Value Ref Range   INR 1.6 (A) 2.0 - 3.0  TSH     Status: None   Collection Time: 05/13/19  1:51 PM  Result Value Ref Range   TSH 2.202 0.350 - 4.500 uIU/mL    Comment: Performed by a 3rd Generation assay with a functional sensitivity of <=0.01 uIU/mL. Performed at [facility], [address], [place], [place] [address]   T4, free     Status: None   Collection Time: 05/13/19  1:51 PM  Result Value Ref Range   Free T4 0.95 0.61 - 1.12 ng/dL    Comment: (NOTE) Biotin ingestion may interfere with free T4 tests. If the results are inconsistent with the TSH level, previous test results, or the clinical presentation, then consider biotin interference. If needed, order repeat testing after stopping biotin. Performed at [facility], [place] [address], [place], [place] [address]      Assessment & Plan:   1.  Hypothyroidism likely related to amiodarone treatment Her previsit thyroid function tests are consistent with appropriate  replacement.    She is advised to continue levothyroxine 50 mcg p.o. daily before breakfast.     - We discussed about the correct intake of her thyroid hormone, on empty stomach at fasting, with water, separated by at least 30 minutes from breakfast and other medications,  and separated by more than 4 hours from calcium, iron, multivitamins, acid reflux medications (PPIs). -Patient is made aware of the fact that thyroid hormone replacement is needed for life, dose to be adjusted by periodic monitoring of thyroid function tests.   - I advised her  to maintain close follow up with Glenda Chroman, MD for primary care needs.  I encouraged her to resume her follow-up with her cardiologist regarding her history of atrial fibrillation which required treatment with amiodarone.      - Time spent on this patient care encounter:  20 minutes of which 50% was spent in  counseling and the rest reviewing  her current and  previous labs / studies and medications  doses and developing a plan for long term care. [name] Her Canal  participated in the  discussions, expressed understanding, and voiced agreement with the above plans.  All questions were answered to her satisfaction. she is encouraged to contact clinic should she have any questions or concerns prior to her return visit.   Follow up plan: Return in about 6 months (around 11/15/2019) for Follow up with Pre-visit Labs.   Glade Lloyd, MD [facility] [address] [place], [place] [address] Phone: [phone]  Fax: [phone]     05/18/2019, 12:37 PM  This note was partially dictated with voice recognition software. Similar sounding words can be transcribed inadequately or may not  be corrected upon review.

## 2019-05-25 DIAGNOSIS — E785 Hyperlipidemia, unspecified: Secondary | ICD-10-CM | POA: Diagnosis not present

## 2019-05-25 DIAGNOSIS — N183 Chronic kidney disease, stage 3 unspecified: Secondary | ICD-10-CM | POA: Diagnosis not present

## 2019-05-25 DIAGNOSIS — Z6824 Body mass index (BMI) 24.0-24.9, adult: Secondary | ICD-10-CM | POA: Diagnosis not present

## 2019-05-25 DIAGNOSIS — I4891 Unspecified atrial fibrillation: Secondary | ICD-10-CM | POA: Diagnosis not present

## 2019-05-25 DIAGNOSIS — Z789 Other specified health status: Secondary | ICD-10-CM | POA: Diagnosis not present

## 2019-05-25 DIAGNOSIS — F411 Generalized anxiety disorder: Secondary | ICD-10-CM | POA: Diagnosis not present

## 2019-05-25 DIAGNOSIS — Z299 Encounter for prophylactic measures, unspecified: Secondary | ICD-10-CM | POA: Diagnosis not present

## 2019-05-28 ENCOUNTER — Other Ambulatory Visit: Payer: Self-pay

## 2019-05-28 ENCOUNTER — Ambulatory Visit (INDEPENDENT_AMBULATORY_CARE_PROVIDER_SITE_OTHER): Payer: Medicare Other | Admitting: *Deleted

## 2019-05-28 DIAGNOSIS — I4819 Other persistent atrial fibrillation: Secondary | ICD-10-CM | POA: Diagnosis not present

## 2019-05-28 DIAGNOSIS — Z5181 Encounter for therapeutic drug level monitoring: Secondary | ICD-10-CM | POA: Diagnosis not present

## 2019-05-28 DIAGNOSIS — Z9889 Other specified postprocedural states: Secondary | ICD-10-CM | POA: Diagnosis not present

## 2019-05-28 LAB — POCT INR: INR: 2.1 (ref 2.0–3.0)

## 2019-05-28 NOTE — Patient Instructions (Signed)
Continue warfarin 2 tablets daily except 3 tablets on Tuesdays and Fridays Recheck in 4 weeks Pt not interested in Eliquis at this time due to cost.

## 2019-06-01 DIAGNOSIS — E7800 Pure hypercholesterolemia, unspecified: Secondary | ICD-10-CM | POA: Diagnosis not present

## 2019-06-01 DIAGNOSIS — J449 Chronic obstructive pulmonary disease, unspecified: Secondary | ICD-10-CM | POA: Diagnosis not present

## 2019-06-01 DIAGNOSIS — I4891 Unspecified atrial fibrillation: Secondary | ICD-10-CM | POA: Diagnosis not present

## 2019-06-01 DIAGNOSIS — M159 Polyosteoarthritis, unspecified: Secondary | ICD-10-CM | POA: Diagnosis not present

## 2019-06-23 DIAGNOSIS — I5032 Chronic diastolic (congestive) heart failure: Secondary | ICD-10-CM | POA: Diagnosis not present

## 2019-06-23 DIAGNOSIS — Z6825 Body mass index (BMI) 25.0-25.9, adult: Secondary | ICD-10-CM | POA: Diagnosis not present

## 2019-06-23 DIAGNOSIS — Z299 Encounter for prophylactic measures, unspecified: Secondary | ICD-10-CM | POA: Diagnosis not present

## 2019-06-23 DIAGNOSIS — J449 Chronic obstructive pulmonary disease, unspecified: Secondary | ICD-10-CM | POA: Diagnosis not present

## 2019-06-23 DIAGNOSIS — N183 Chronic kidney disease, stage 3 unspecified: Secondary | ICD-10-CM | POA: Diagnosis not present

## 2019-06-23 DIAGNOSIS — Z789 Other specified health status: Secondary | ICD-10-CM | POA: Diagnosis not present

## 2019-06-23 DIAGNOSIS — I4891 Unspecified atrial fibrillation: Secondary | ICD-10-CM | POA: Diagnosis not present

## 2019-06-25 ENCOUNTER — Other Ambulatory Visit: Payer: Self-pay

## 2019-06-25 ENCOUNTER — Ambulatory Visit (INDEPENDENT_AMBULATORY_CARE_PROVIDER_SITE_OTHER): Payer: Medicare Other | Admitting: *Deleted

## 2019-06-25 DIAGNOSIS — Z9889 Other specified postprocedural states: Secondary | ICD-10-CM

## 2019-06-25 DIAGNOSIS — I4819 Other persistent atrial fibrillation: Secondary | ICD-10-CM | POA: Diagnosis not present

## 2019-06-25 DIAGNOSIS — Z5181 Encounter for therapeutic drug level monitoring: Secondary | ICD-10-CM

## 2019-06-25 LAB — POCT INR: INR: 2.8 (ref 2.0–3.0)

## 2019-06-25 NOTE — Patient Instructions (Signed)
Continue warfarin 2 tablets daily except 3 tablets on Tuesdays and Fridays Recheck in 4 weeks Pt not interested in Eliquis at this time due to cost.

## 2019-06-26 DIAGNOSIS — I4891 Unspecified atrial fibrillation: Secondary | ICD-10-CM | POA: Diagnosis not present

## 2019-06-26 DIAGNOSIS — J449 Chronic obstructive pulmonary disease, unspecified: Secondary | ICD-10-CM | POA: Diagnosis not present

## 2019-06-26 DIAGNOSIS — M159 Polyosteoarthritis, unspecified: Secondary | ICD-10-CM | POA: Diagnosis not present

## 2019-06-26 DIAGNOSIS — E7800 Pure hypercholesterolemia, unspecified: Secondary | ICD-10-CM | POA: Diagnosis not present

## 2019-07-13 DIAGNOSIS — J449 Chronic obstructive pulmonary disease, unspecified: Secondary | ICD-10-CM | POA: Diagnosis not present

## 2019-07-13 DIAGNOSIS — I4891 Unspecified atrial fibrillation: Secondary | ICD-10-CM | POA: Diagnosis not present

## 2019-07-13 DIAGNOSIS — L03116 Cellulitis of left lower limb: Secondary | ICD-10-CM | POA: Diagnosis not present

## 2019-07-13 DIAGNOSIS — Z299 Encounter for prophylactic measures, unspecified: Secondary | ICD-10-CM | POA: Diagnosis not present

## 2019-07-20 ENCOUNTER — Other Ambulatory Visit: Payer: Self-pay | Admitting: Cardiology

## 2019-07-23 ENCOUNTER — Other Ambulatory Visit: Payer: Self-pay

## 2019-07-23 ENCOUNTER — Ambulatory Visit (INDEPENDENT_AMBULATORY_CARE_PROVIDER_SITE_OTHER): Payer: Medicare Other | Admitting: *Deleted

## 2019-07-23 DIAGNOSIS — I4819 Other persistent atrial fibrillation: Secondary | ICD-10-CM

## 2019-07-23 DIAGNOSIS — J449 Chronic obstructive pulmonary disease, unspecified: Secondary | ICD-10-CM | POA: Diagnosis not present

## 2019-07-23 DIAGNOSIS — Z9889 Other specified postprocedural states: Secondary | ICD-10-CM | POA: Diagnosis not present

## 2019-07-23 DIAGNOSIS — E7800 Pure hypercholesterolemia, unspecified: Secondary | ICD-10-CM | POA: Diagnosis not present

## 2019-07-23 DIAGNOSIS — M159 Polyosteoarthritis, unspecified: Secondary | ICD-10-CM | POA: Diagnosis not present

## 2019-07-23 DIAGNOSIS — Z85038 Personal history of other malignant neoplasm of large intestine: Secondary | ICD-10-CM | POA: Diagnosis not present

## 2019-07-23 DIAGNOSIS — Z5181 Encounter for therapeutic drug level monitoring: Secondary | ICD-10-CM | POA: Diagnosis not present

## 2019-07-23 DIAGNOSIS — I4891 Unspecified atrial fibrillation: Secondary | ICD-10-CM | POA: Diagnosis not present

## 2019-07-23 LAB — POCT INR: INR: 4 — AB (ref 2.0–3.0)

## 2019-07-23 NOTE — Patient Instructions (Signed)
Hold warfarin tonight, take 2 tablets tomorrow then resume 2 tablets daily except 3 tablets on Tuesdays and Fridays.  Finishes Abx today. Recheck in 3 weeks Pt not interested in Eliquis at this time due to cost.

## 2019-07-30 DIAGNOSIS — Z6823 Body mass index (BMI) 23.0-23.9, adult: Secondary | ICD-10-CM | POA: Diagnosis not present

## 2019-07-30 DIAGNOSIS — R591 Generalized enlarged lymph nodes: Secondary | ICD-10-CM | POA: Diagnosis not present

## 2019-07-30 DIAGNOSIS — R601 Generalized edema: Secondary | ICD-10-CM | POA: Diagnosis not present

## 2019-08-03 DIAGNOSIS — M25461 Effusion, right knee: Secondary | ICD-10-CM | POA: Diagnosis not present

## 2019-08-03 DIAGNOSIS — M25562 Pain in left knee: Secondary | ICD-10-CM | POA: Diagnosis not present

## 2019-08-03 DIAGNOSIS — M1711 Unilateral primary osteoarthritis, right knee: Secondary | ICD-10-CM | POA: Diagnosis not present

## 2019-08-10 DIAGNOSIS — N183 Chronic kidney disease, stage 3 unspecified: Secondary | ICD-10-CM | POA: Diagnosis not present

## 2019-08-10 DIAGNOSIS — W57XXXA Bitten or stung by nonvenomous insect and other nonvenomous arthropods, initial encounter: Secondary | ICD-10-CM | POA: Diagnosis not present

## 2019-08-10 DIAGNOSIS — Z299 Encounter for prophylactic measures, unspecified: Secondary | ICD-10-CM | POA: Diagnosis not present

## 2019-08-10 DIAGNOSIS — E785 Hyperlipidemia, unspecified: Secondary | ICD-10-CM | POA: Diagnosis not present

## 2019-08-10 DIAGNOSIS — J449 Chronic obstructive pulmonary disease, unspecified: Secondary | ICD-10-CM | POA: Diagnosis not present

## 2019-08-10 DIAGNOSIS — I4891 Unspecified atrial fibrillation: Secondary | ICD-10-CM | POA: Diagnosis not present

## 2019-08-11 ENCOUNTER — Other Ambulatory Visit: Payer: Self-pay

## 2019-08-11 ENCOUNTER — Ambulatory Visit (INDEPENDENT_AMBULATORY_CARE_PROVIDER_SITE_OTHER): Payer: Medicare Other | Admitting: *Deleted

## 2019-08-11 DIAGNOSIS — I4819 Other persistent atrial fibrillation: Secondary | ICD-10-CM | POA: Diagnosis not present

## 2019-08-11 DIAGNOSIS — Z5181 Encounter for therapeutic drug level monitoring: Secondary | ICD-10-CM

## 2019-08-11 DIAGNOSIS — Z9889 Other specified postprocedural states: Secondary | ICD-10-CM | POA: Diagnosis not present

## 2019-08-11 LAB — POCT INR: INR: 1.6 — AB (ref 2.0–3.0)

## 2019-08-11 NOTE — Patient Instructions (Signed)
Continue 2 tablets daily except 3 tablets on Tuesdays and Fridays. Will not increase dose since pt started doxycycline last night. Recheck in 2 weeks Pt not interested in Eliquis at this time due to cost.

## 2019-08-12 DIAGNOSIS — D179 Benign lipomatous neoplasm, unspecified: Secondary | ICD-10-CM | POA: Diagnosis not present

## 2019-08-12 DIAGNOSIS — L821 Other seborrheic keratosis: Secondary | ICD-10-CM | POA: Diagnosis not present

## 2019-08-12 DIAGNOSIS — L989 Disorder of the skin and subcutaneous tissue, unspecified: Secondary | ICD-10-CM | POA: Diagnosis not present

## 2019-08-12 DIAGNOSIS — D485 Neoplasm of uncertain behavior of skin: Secondary | ICD-10-CM | POA: Diagnosis not present

## 2019-08-17 DIAGNOSIS — M1711 Unilateral primary osteoarthritis, right knee: Secondary | ICD-10-CM | POA: Diagnosis not present

## 2019-08-18 ENCOUNTER — Encounter: Payer: Self-pay | Admitting: Nurse Practitioner

## 2019-08-18 ENCOUNTER — Other Ambulatory Visit: Payer: Self-pay

## 2019-08-18 ENCOUNTER — Ambulatory Visit (INDEPENDENT_AMBULATORY_CARE_PROVIDER_SITE_OTHER): Payer: Medicare Other | Admitting: Nurse Practitioner

## 2019-08-18 VITALS — BP 115/69 | HR 77 | Temp 97.0°F | Ht 61.0 in | Wt 128.2 lb

## 2019-08-18 DIAGNOSIS — K219 Gastro-esophageal reflux disease without esophagitis: Secondary | ICD-10-CM

## 2019-08-18 DIAGNOSIS — R1030 Lower abdominal pain, unspecified: Secondary | ICD-10-CM

## 2019-08-18 DIAGNOSIS — K5900 Constipation, unspecified: Secondary | ICD-10-CM

## 2019-08-18 NOTE — Assessment & Plan Note (Signed)
The patient describes chronic right lower quadrant abdominal discomfort.  Ice typically helps.  Was diagnosed with a, what sounds like, inguinal hernia and offered surgical referral by primary care.  She is still deciding if she would like to pursue this.  Recommend she continue her current regimen that helps her pain including ice.  Follow-up in 6 months.  Follow-up with primary care as well.

## 2019-08-18 NOTE — Assessment & Plan Note (Signed)
Rare constipation, typically occurs about once a month.  She is wanting simple over-the-counter regimen that she can take as needed.  This plan will have her take MiraLAX once a day or twice a day, as needed for intermittent constipation.  Ensure adequate fiber and water intake.  Follow-up in 6 months.

## 2019-08-18 NOTE — Patient Instructions (Addendum)
Your health issues we discussed today were:   Constipation: 1. Make sure you are drinking plenty of water and eating adequate fiber (fruits, vegetables, whole grains) 2. You can use MiraLAX once a day, or twice a day, as needed for constipation that occurs occasionally 3. Call us for any worsening or severe symptoms  GERD (reflux/heartburn): 1. Given your large hiatal hernia in your stomach you will likely need to be on an acid blocker to prevent worsening reflux 2. Continue taking Protonix 3. I have printed patient information related to side effects 4. Continue to follow with your primary care for bone density screenings and treatment for any osteoporosis (thinning bones) as needed 5. Call us if you have any worsening or severe symptoms  Abdominal pain: 1. As we discussed, your abdominal pain on the right lower side is likely related to scar tissue and/or an inguinal hernia 2. Think about if you would like a surgical referral and notify your primary care when you have made your decision 3. You can continue to use ice that seems to help your pain 4. Call us for any worsening or severe symptoms  Overall I recommend:  1. Continue your other current medications 2. Return for follow-up in 6 months 3. Call us if you have any questions or concerns   At [facility] we value your feedback. You may receive a survey about your visit today. Please share your experience as we strive to create trusting relationships with our patients to provide genuine, compassionate, quality care.  We appreciate your understanding and patience as we review any laboratory studies, imaging, and other diagnostic tests that are ordered as we care for you. Our office policy is 5 business days for review of these results, and any emergent or urgent results are addressed in a timely manner for your best interest. If you do not hear from our office in 1 week, please contact us.   We also encourage the use  of MyChart, which contains your medical information for your review as well. If you are not enrolled in this feature, an access code is on this after visit summary for your convenience. Thank you for allowing [name] to be involved in your care.  It was great to see you today!  I hope you have a great Summer!!

## 2019-08-18 NOTE — Assessment & Plan Note (Signed)
GERD symptoms currently doing well on PPI.  She is concerned about possible adverse effects due to the long list of side effects associated with PPIs.  We discussed likely adverse effects extensively including most commonly bone mineral loss from decreased mineral absorption.  She is currently on calcium and her primary care does follow through with bone density screenings.  Recommend she continue her PPI given her large hiatal hernia and concerns for possible flare of GERD if she comes off.  I will copy patient educational information for her related to PPI side effects.  Follow-up in 6 months.

## 2019-08-18 NOTE — Progress Notes (Signed)
Referring Provider: Glenda Chroman, MD Primary Care Physician:  Glenda Chroman, MD Primary GI:  Dr. Gala Romney  Chief Complaint  Patient presents with  . Gastroesophageal Reflux    wants to discuss possible changing medication  . Abdominal Pain    lower abd, comes/goes, reports hernia giving more trouble    HPI:   [name] [name] is a 79 y.o. female who presents for follow-up on GERD and abdominal pain.  The patient was last seen in our office 02/17/2019 for GERD, lower abdominal pain, abnormal CT of the abdomen.  Noted history of grade 3/4 hemorrhoids with significant symptoms.  Apothecary cream and sitz bath with persistent once weekly scan toilet tissue hematochezia and difficulty maintaining hygiene.  CT of the abdomen found minimally prominent stool throughout the colon, large hiatal hernia with questionable wall thickening of the stomach cannot exclude gastritis or infiltrative process and recommended EGD.  EGD updated found normal but tortuous esophagus, large hiatal hernia, otherwise normal.  Suspected artifact on CT.  Recommended Protonix 40 mg daily.  Colonoscopy the same day, high risk for personal history of colon polyps and colon cancer, with a 5 mm polyp at the anastomosis, otherwise normal.  Recommended no repeat colonoscopy due to age.  At her last visit she noted a lot of anxiety, pharmacy never refilled her PPI.  Reflux symptoms ongoing due to no Protonix.  Mid lower and right lower quadrant abdominal pain which is intermittent and typically about once a month described as burning which is typically relieved with ice.  Bowel movement 2-3 times a day without straining.  No other overt GI complaints.  I sent in another refill of her Protonix to be filled.  Overall abdominal discomfort, rare, mild felt to be likely scar tissue. Recommended avoid heavy lifting and other activities that typically exacerbate or trigger pain, follow-up in 6 months.  Today she states she's doing ok  overall. GERD doing well on PPI. Is concerned about ADEs so we had a good discussion about this. Intermittent constipation about once a month and is interested in OTC treatment. Has some RLQ discomfort and states she was told she has a lower abdominal hernia and was offered surgical referral for hernia, but hasn't decided if she wants to pursue this. Denies other abdominal pain, N/V, hematochezia, melena, fever, chills, unintentional weight loss. Denies URI or flu-like symptoms. Denies loss of sense of taste or smell. She has not had a COVID-19 vaccine and already has information on how to schedule one. Denies chest pain, dyspnea, dizziness, lightheadedness, syncope, near syncope. Denies any other upper or lower GI symptoms.  Past Medical History:  Diagnosis Date  . Anxiety   . Arthritis   . Asthma   . Atrial fibrillation, persistent ([place])   . Chronic diastolic congestive heart failure ([place])   . Colon adenoma   . Colon cancer ([place])    status post low anterior resection, limited stage disease requiring no adjuvant therapy  . COPD (chronic obstructive pulmonary disease) ([place])   . Depression   . Diverticulosis   . DM (dermatomyositis)   . DVT (deep venous thrombosis) ([name])    in leg- long time ago  . Dyspnea    with activity  . Dysrhythmia   . Esophageal dysphagia   . GERD (gastroesophageal reflux disease)   . Headache   . Heart murmur   . Hematuria   . Hemorrhoids   . Hiatal hernia   . History of kidney stones  x 2  . Hypercholesterolemia   . Incidental pulmonary nodule 05/08/2016   8 mm vague opacity RML noted on CT scan  . Mitral regurgitation   . PONV (postoperative nausea and vomiting) 2003 ish    with breast biopsy  . S/P minimally invasive maze operation for atrial fibrillation 07/05/2016   Complete bilateral atrial lesion set using cryothermy and bipolar radiofrequency ablation with clipping of LA appendage via right mini thoracotomy approach  . S/P minimally invasive  mitral valve repair 07/05/2016   Complex valvuloplasty including artificial Gore-tex neochord placement x6 and 30 mm Sorin Memo 3D ring annuloplasty via right mini thoracotomy approach  . Schatzki's ring   . Tricuspid regurgitation     Past Surgical History:  Procedure Laterality Date  . APPENDECTOMY    . BREAST SURGERY Right 2003ish   biopsy  . CARDIAC CATHETERIZATION N/A 04/12/2016   Procedure: Right/Left Heart Cath and Coronary Angiography;  Surgeon: Leonie Man, MD;  Location: [facility];  Service: Cardiovascular;  Laterality: N/A;  . CARDIOVERSION N/A 08/16/2017   Procedure: CARDIOVERSION;  Surgeon: Arnoldo Lenis, MD;  [name];  Service: Endoscopy;  Laterality: N/A;  . CATARACT EXTRACTION Bilateral 2017  . CLIPPING OF ATRIAL APPENDAGE  07/05/2016   Procedure: CLIPPING OF ATRIAL APPENDAGE;  Surgeon: Rexene Alberts, MD;  Location: [place];  Service: Open Heart Surgery;;  . COLONOSCOPY  11/09   Dr. Gala Romney- external hemorrhoidal tags o/w normal rectal mucosa, s/p surgical resection with a normal appearing anastomosis 12cm, pan colonic diverticulum  . COLONOSCOPY N/A 06/02/2012   [id number] post low anterior resection. Pancolonic diverticulosis. Colonic polyp-tubular adenoma. Surveillance due 2019.   Marland Kitchen COLONOSCOPY N/A 10/13/2015   Procedure: COLONOSCOPY;  Surgeon: Daneil Dolin, MD;  [name];  Service: Endoscopy;  Laterality: N/A;  0930  . COLONOSCOPY WITH PROPOFOL N/A 11/20/2018   Procedure: COLONOSCOPY WITH PROPOFOL;  Surgeon: Daneil Dolin, MD;  [name];  Service: Endoscopy;  Laterality: N/A;  2:30pm  . ESOPHAGOGASTRODUODENOSCOPY  07/2007   Dr. Daiva Nakayama cervical esophageal web, schatzki ring, large hiatal hernia  . ESOPHAGOGASTRODUODENOSCOPY (EGD) WITH PROPOFOL N/A 11/20/2018   Procedure: ESOPHAGOGASTRODUODENOSCOPY (EGD) WITH PROPOFOL;  Surgeon: Daneil Dolin, MD;  [name];  Service: Endoscopy;  Laterality:  N/A;  . INGUNAL HERNIA REPAIR Left   . LEG SKIN LESION  BIOPSY / EXCISION    . LOW ANTERIOR BOWEL RESECTION     NO ADJ CHEMO  . MINIMALLY INVASIVE MAZE PROCEDURE N/A 07/05/2016   Procedure: MINIMALLY INVASIVE MAZE PROCEDURE;  Surgeon: Rexene Alberts, MD;  Location: [place];  Service: Open Heart Surgery;  Laterality: N/A;  . MITRAL VALVE REPAIR Right 07/05/2016   Procedure: MINIMALLY INVASIVE MITRAL VALVE REPAIR (MVR);  Surgeon: Rexene Alberts, MD;  Location: [place];  Service: Open Heart Surgery;  Laterality: Right;  . MULTIPLE EXTRACTIONS WITH ALVEOLOPLASTY N/A 05/21/2016   Procedure: MULTIPLE EXTRACTION OF TOOTH #'S 5, 21 WITH ALVEOLOPLASTY AND GROSS DEBRIDEMENT OF TEETH;  Surgeon: Lenn Cal, DDS;  Location: [place];  Service: Oral Surgery;  Laterality: N/A;  . POLYPECTOMY  11/20/2018   Procedure: POLYPECTOMY;  Surgeon: Daneil Dolin, MD;  [name];  Service: Endoscopy;;  colon  . TEE WITHOUT CARDIOVERSION N/A 02/20/2016   Procedure: TRANSESOPHAGEAL ECHOCARDIOGRAM (TEE);  Surgeon: Arnoldo Lenis, MD;  [name];  Service: Endoscopy;  Laterality: N/A;  . TEE WITHOUT CARDIOVERSION N/A 07/05/2016  Procedure: TRANSESOPHAGEAL ECHOCARDIOGRAM (TEE);  Surgeon: Rexene Alberts, MD;  Location: [place];  Service: Open Heart Surgery;  Laterality: N/A;  . TOOTH EXTRACTION    . TUBAL LIGATION    . VEIN LIGATION AND STRIPPING      Current Outpatient Medications  Medication Sig Dispense Refill  . acetaminophen (TYLENOL) 500 MG tablet Take 1,000 mg by mouth 2 (two) times daily as needed for moderate pain or headache.     . albuterol (PROVENTIL) (2.5 MG/3ML) 0.083% nebulizer solution Take 2.5 mg by nebulization every 6 (six) hours as needed for wheezing or shortness of breath.    . ALPRAZolam (XANAX) 0.5 MG tablet Take 0.5 mg by mouth at bedtime.     Marland Kitchen aspirin EC 81 MG EC tablet Take 1 tablet (81 mg total) by mouth daily.    . calcium carbonate (OS-CAL) 600 MG TABS tablet  Take 600 mg by mouth 2 (two) times daily with a meal.    . Coenzyme Q10 100 MG TABS Take 100 mg by mouth every evening.     Marland Kitchen levothyroxine (SYNTHROID) 50 MCG tablet Take 1 tablet (50 mcg total) by mouth daily before breakfast. 90 tablet 1  . Multiple Vitamin (MULTIVITAMIN) tablet Take 1 tablet by mouth daily.      . pantoprazole (PROTONIX) 40 MG tablet Take 1 tablet (40 mg total) by mouth daily. 90 tablet 3  . Polyethyl Glycol-Propyl Glycol (SYSTANE ULTRA) 0.4-0.3 % SOLN Place 1 drop into both eyes daily as needed (dry eyes).     . simvastatin (ZOCOR) 40 MG tablet Take 40 mg daily by mouth.    . sodium chloride (OCEAN) 0.65 % SOLN nasal spray Place 1 spray into both nostrils daily as needed for congestion.     Marland Kitchen warfarin (COUMADIN) 2.5 MG tablet TAKE 2 TABLETS BY MOUTH EVERY DAY EXCEPT TAKE 3 TABLETS EVERY DAY ON TUESDAY AND FRIDAY OR AS DIRECTED 192 tablet 3   No current facility-administered medications for this visit.    Allergies as of 08/18/2019 - Review Complete 08/18/2019  Allergen Reaction Noted  . Iohexol Hives, Shortness Of Breath, and Other (See Comments) 07/16/2008  . Hydrocodone-acetaminophen Hives 04/26/2010  . Nabumetone Rash 04/26/2010  . Prednisone Rash 10/13/2015    Family History  Problem Relation Age of Onset  . Breast cancer Mother   . Rheum arthritis Father   . Cancer - Lung Sister   . Brain cancer Sister   . Prostate cancer Brother   . Aneurysm Brother   . Colon cancer Neg Hx     Social History   Socioeconomic History  . Marital status: Married    Spouse name: Not on file  . Number of children: 2  . Years of education: Not on file  . Highest education level: Not on file  Occupational History  . Not on file  Tobacco Use  . Smoking status: Never Smoker  . Smokeless tobacco: Never Used  Substance and Sexual Activity  . Alcohol use: No    Alcohol/week: 0.0 standard drinks  . Drug use: No  . Sexual activity: Not on file  Other Topics Concern  .  Not on file  Social History Narrative  . Not on file   Social Determinants of Health   Financial Resource Strain:   . Difficulty of Paying Living Expenses:   Food Insecurity:   . Worried About Charity fundraiser in the Last Year:   . [place] in the Last Year:  Transportation Needs:   . Film/video editor (Medical):   Marland Kitchen Lack of Transportation (Non-Medical):   Physical Activity:   . Days of Exercise per Week:   . Minutes of Exercise per Session:   Stress:   . Feeling of Stress :   Social Connections:   . Frequency of Communication with Friends and Family:   . Frequency of Social Gatherings with Friends and Family:   . Attends Religious Services:   . Active Member of Clubs or Organizations:   . Attends Archivist Meetings:   Marland Kitchen Marital Status:     Subjective: Review of Systems  Constitutional: Negative for chills, fever, malaise/fatigue and weight loss.  HENT: Negative for congestion and sore throat.   Respiratory: Negative for cough and shortness of breath.   Cardiovascular: Negative for chest pain and palpitations.  Gastrointestinal: Positive for abdominal pain (RLQ) and constipation (rare, about once a month). Negative for blood in stool, diarrhea, melena, nausea and vomiting.  Musculoskeletal: Negative for joint pain and myalgias.  Skin: Negative for rash.  Neurological: Negative for dizziness and weakness.  Endo/Heme/Allergies: Does not bruise/bleed easily.  Psychiatric/Behavioral: The patient is nervous/anxious ("frustration").   All other systems reviewed and are negative.    Objective: BP 115/69   Pulse 77   Temp (!) 97 F (36.1 C) (Oral)   Ht 5\' 1"  (1.549 m)   Wt 128 lb 3.2 oz (58.2 kg)   BMI 24.22 kg/m  Physical Exam Vitals and nursing note reviewed.  Constitutional:      General: She is not in acute distress.    Appearance: Normal appearance. She is well-developed. She is not ill-appearing, toxic-appearing or diaphoretic.  HENT:      Head: Normocephalic and atraumatic.     Nose: No congestion or rhinorrhea.  Eyes:     General: No scleral icterus. Cardiovascular:     Rate and Rhythm: Normal rate and regular rhythm.     Heart sounds: Murmur present. Systolic murmur present with a grade of 4/6.  Pulmonary:     Effort: Pulmonary effort is normal. No respiratory distress.     Breath sounds: Normal breath sounds.  Abdominal:     General: Bowel sounds are normal.     Palpations: Abdomen is soft. There is no hepatomegaly, splenomegaly or mass.     Tenderness: There is no abdominal tenderness. There is no guarding or rebound.     Hernia: No hernia is present.  Skin:    General: Skin is warm and dry.     Coloration: Skin is not jaundiced.     Findings: No rash.  Neurological:     General: No focal deficit present.     Mental Status: She is alert and oriented to person, place, and time.  Psychiatric:        Attention and Perception: Attention normal.        Mood and Affect: Mood normal.        Speech: Speech normal.        Behavior: Behavior normal.        Thought Content: Thought content normal.        Cognition and Memory: Cognition and memory normal.       08/18/2019 10:25 AM   Disclaimer: This note was dictated with voice recognition software. Similar sounding words can inadvertently be transcribed and may not be corrected upon review.

## 2019-08-19 DIAGNOSIS — D485 Neoplasm of uncertain behavior of skin: Secondary | ICD-10-CM | POA: Diagnosis not present

## 2019-08-19 DIAGNOSIS — L989 Disorder of the skin and subcutaneous tissue, unspecified: Secondary | ICD-10-CM | POA: Diagnosis not present

## 2019-08-24 DIAGNOSIS — M1711 Unilateral primary osteoarthritis, right knee: Secondary | ICD-10-CM | POA: Diagnosis not present

## 2019-08-25 ENCOUNTER — Other Ambulatory Visit: Payer: Self-pay

## 2019-08-25 ENCOUNTER — Ambulatory Visit (INDEPENDENT_AMBULATORY_CARE_PROVIDER_SITE_OTHER): Payer: Medicare Other | Admitting: *Deleted

## 2019-08-25 DIAGNOSIS — Z9889 Other specified postprocedural states: Secondary | ICD-10-CM | POA: Diagnosis not present

## 2019-08-25 DIAGNOSIS — I4819 Other persistent atrial fibrillation: Secondary | ICD-10-CM

## 2019-08-25 DIAGNOSIS — Z5181 Encounter for therapeutic drug level monitoring: Secondary | ICD-10-CM | POA: Diagnosis not present

## 2019-08-25 LAB — POCT INR: INR: 2.1 (ref 2.0–3.0)

## 2019-08-25 NOTE — Patient Instructions (Signed)
Continue 2 tablets daily except 3 tablets on Tuesdays and Fridays.  Recheck in 4 weeks Pt not interested in Eliquis at this time due to cost.

## 2019-08-31 DIAGNOSIS — M1711 Unilateral primary osteoarthritis, right knee: Secondary | ICD-10-CM | POA: Diagnosis not present

## 2019-09-20 DIAGNOSIS — M159 Polyosteoarthritis, unspecified: Secondary | ICD-10-CM | POA: Diagnosis not present

## 2019-09-20 DIAGNOSIS — I4891 Unspecified atrial fibrillation: Secondary | ICD-10-CM | POA: Diagnosis not present

## 2019-09-20 DIAGNOSIS — J449 Chronic obstructive pulmonary disease, unspecified: Secondary | ICD-10-CM | POA: Diagnosis not present

## 2019-09-20 DIAGNOSIS — E7800 Pure hypercholesterolemia, unspecified: Secondary | ICD-10-CM | POA: Diagnosis not present

## 2019-09-23 ENCOUNTER — Ambulatory Visit (INDEPENDENT_AMBULATORY_CARE_PROVIDER_SITE_OTHER): Payer: Medicare Other | Admitting: *Deleted

## 2019-09-23 ENCOUNTER — Other Ambulatory Visit: Payer: Self-pay

## 2019-09-23 DIAGNOSIS — I4819 Other persistent atrial fibrillation: Secondary | ICD-10-CM | POA: Diagnosis not present

## 2019-09-23 DIAGNOSIS — Z5181 Encounter for therapeutic drug level monitoring: Secondary | ICD-10-CM

## 2019-09-23 DIAGNOSIS — Z9889 Other specified postprocedural states: Secondary | ICD-10-CM

## 2019-09-23 LAB — POCT INR: INR: 2.2 (ref 2.0–3.0)

## 2019-09-23 NOTE — Patient Instructions (Signed)
Continue 2 tablets daily except 3 tablets on Tuesdays and Fridays.  Recheck in 4 weeks Pt not interested in Eliquis at this time due to cost.

## 2019-09-29 DIAGNOSIS — E039 Hypothyroidism, unspecified: Secondary | ICD-10-CM | POA: Diagnosis not present

## 2019-09-29 DIAGNOSIS — J449 Chronic obstructive pulmonary disease, unspecified: Secondary | ICD-10-CM | POA: Diagnosis not present

## 2019-09-29 DIAGNOSIS — Z299 Encounter for prophylactic measures, unspecified: Secondary | ICD-10-CM | POA: Diagnosis not present

## 2019-09-29 DIAGNOSIS — N183 Chronic kidney disease, stage 3 unspecified: Secondary | ICD-10-CM | POA: Diagnosis not present

## 2019-09-29 DIAGNOSIS — F411 Generalized anxiety disorder: Secondary | ICD-10-CM | POA: Diagnosis not present

## 2019-10-01 DIAGNOSIS — I110 Hypertensive heart disease with heart failure: Secondary | ICD-10-CM | POA: Diagnosis not present

## 2019-10-01 DIAGNOSIS — Z9851 Tubal ligation status: Secondary | ICD-10-CM | POA: Diagnosis not present

## 2019-10-01 DIAGNOSIS — J449 Chronic obstructive pulmonary disease, unspecified: Secondary | ICD-10-CM | POA: Diagnosis not present

## 2019-10-01 DIAGNOSIS — I4891 Unspecified atrial fibrillation: Secondary | ICD-10-CM | POA: Diagnosis not present

## 2019-10-01 DIAGNOSIS — K219 Gastro-esophageal reflux disease without esophagitis: Secondary | ICD-10-CM | POA: Diagnosis not present

## 2019-10-01 DIAGNOSIS — I10 Essential (primary) hypertension: Secondary | ICD-10-CM | POA: Diagnosis not present

## 2019-10-01 DIAGNOSIS — I5032 Chronic diastolic (congestive) heart failure: Secondary | ICD-10-CM | POA: Diagnosis not present

## 2019-10-01 DIAGNOSIS — Z85038 Personal history of other malignant neoplasm of large intestine: Secondary | ICD-10-CM | POA: Diagnosis not present

## 2019-10-01 DIAGNOSIS — E785 Hyperlipidemia, unspecified: Secondary | ICD-10-CM | POA: Diagnosis not present

## 2019-10-01 DIAGNOSIS — E079 Disorder of thyroid, unspecified: Secondary | ICD-10-CM | POA: Diagnosis not present

## 2019-10-01 DIAGNOSIS — R519 Headache, unspecified: Secondary | ICD-10-CM | POA: Diagnosis not present

## 2019-10-01 DIAGNOSIS — F419 Anxiety disorder, unspecified: Secondary | ICD-10-CM | POA: Diagnosis not present

## 2019-10-01 DIAGNOSIS — Z86718 Personal history of other venous thrombosis and embolism: Secondary | ICD-10-CM | POA: Diagnosis not present

## 2019-10-02 DIAGNOSIS — I4891 Unspecified atrial fibrillation: Secondary | ICD-10-CM | POA: Diagnosis not present

## 2019-10-02 DIAGNOSIS — R519 Headache, unspecified: Secondary | ICD-10-CM | POA: Diagnosis not present

## 2019-10-02 DIAGNOSIS — Z299 Encounter for prophylactic measures, unspecified: Secondary | ICD-10-CM | POA: Diagnosis not present

## 2019-10-02 DIAGNOSIS — N183 Chronic kidney disease, stage 3 unspecified: Secondary | ICD-10-CM | POA: Diagnosis not present

## 2019-10-02 DIAGNOSIS — J449 Chronic obstructive pulmonary disease, unspecified: Secondary | ICD-10-CM | POA: Diagnosis not present

## 2019-10-06 ENCOUNTER — Encounter: Payer: Self-pay | Admitting: Cardiology

## 2019-10-06 ENCOUNTER — Other Ambulatory Visit: Payer: Self-pay

## 2019-10-06 ENCOUNTER — Ambulatory Visit (INDEPENDENT_AMBULATORY_CARE_PROVIDER_SITE_OTHER): Payer: Medicare Other | Admitting: Cardiology

## 2019-10-06 VITALS — BP 124/68 | HR 88 | Ht 61.0 in | Wt 129.0 lb

## 2019-10-06 DIAGNOSIS — R519 Headache, unspecified: Secondary | ICD-10-CM

## 2019-10-06 DIAGNOSIS — R030 Elevated blood-pressure reading, without diagnosis of hypertension: Secondary | ICD-10-CM | POA: Diagnosis not present

## 2019-10-06 NOTE — Progress Notes (Signed)
Clinical Summary [name] [name] is a 79 y.o.female seen today as a focused visit after an ER visit with headaches and HTN. For more detailed cardiac history please refer to prior clinic notes   1. Elevated blood pressure/Headache -seen in ER at [facility] 09/2019 with headache - severe right sided headache sudden onset. Checked her bp's at home and SBP in 160s, came to ER - in ER benign CT head. Asked to f/u with cardiology for HTN.      Past Medical History:  Diagnosis Date  . Anxiety   . Arthritis   . Asthma   . Atrial fibrillation, persistent ([name])   . Chronic diastolic congestive heart failure ([name])   . Colon adenoma   . Colon cancer ([name])    status post low anterior resection, limited stage disease requiring no adjuvant therapy  . COPD (chronic obstructive pulmonary disease) ([name])   . Depression   . Diverticulosis   . DM (dermatomyositis)   . DVT (deep venous thrombosis) ([name])    in leg- long time ago  . Dyspnea    with activity  . Dysrhythmia   . Esophageal dysphagia   . GERD (gastroesophageal reflux disease)   . Headache   . Heart murmur   . Hematuria   . Hemorrhoids   . Hiatal hernia   . History of kidney stones    x 2  . Hypercholesterolemia   . Incidental pulmonary nodule 05/08/2016   8 mm vague opacity RML noted on CT scan  . Mitral regurgitation   . PONV (postoperative nausea and vomiting) 2003 ish    with breast biopsy  . S/P minimally invasive maze operation for atrial fibrillation 07/05/2016   Complete bilateral atrial lesion set using cryothermy and bipolar radiofrequency ablation with clipping of LA appendage via right mini thoracotomy approach  . 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 Schatzki's ring   . Tricuspid regurgitation      Allergies  Allergen Reactions  . Iohexol  Hives, Shortness Of Breath and Other (See Comments)     Pt. had a severe allergic reaction to IV contrast the last time she was injected and had to be seen in the ER.   Marland Kitchen Hydrocodone-Acetaminophen Hives  . Nabumetone Rash  . Prednisone Rash     Current Outpatient Medications  Medication Sig Dispense Refill  . acetaminophen (TYLENOL) 500 MG tablet Take 1,000 mg by mouth 2 (two) times daily as needed for moderate pain or headache.     . albuterol (PROVENTIL) (2.5 MG/3ML) 0.083% nebulizer solution Take 2.5 mg by nebulization every 6 (six) hours as needed for wheezing or shortness of breath.    . ALPRAZolam (XANAX) 0.5 MG tablet Take 0.5 mg by mouth at bedtime.     Marland Kitchen aspirin EC 81 MG EC tablet Take 1 tablet (81 mg total) by mouth daily.    . calcium carbonate (OS-CAL) 600 MG TABS tablet Take 600 mg by mouth 2 (two) times daily with a meal.    . Coenzyme Q10 100 MG TABS Take 100 mg by mouth every evening.     Marland Kitchen levothyroxine (SYNTHROID) 50 MCG tablet Take 1 tablet (50 mcg total) by mouth daily before breakfast. 90 tablet 1  . Multiple Vitamin (MULTIVITAMIN) tablet Take 1 tablet by mouth daily.      . pantoprazole (  PROTONIX) 40 MG tablet Take 1 tablet (40 mg total) by mouth daily. 90 tablet 3  . Polyethyl Glycol-Propyl Glycol (SYSTANE ULTRA) 0.4-0.3 % SOLN Place 1 drop into both eyes daily as needed (dry eyes).     . simvastatin (ZOCOR) 40 MG tablet Take 40 mg daily by mouth.    . sodium chloride (OCEAN) 0.65 % SOLN nasal spray Place 1 spray into both nostrils daily as needed for congestion.     Marland Kitchen warfarin (COUMADIN) 2.5 MG tablet TAKE 2 TABLETS BY MOUTH EVERY DAY EXCEPT TAKE 3 TABLETS EVERY DAY ON TUESDAY AND FRIDAY OR AS DIRECTED 192 tablet 3   No current facility-administered medications for this visit.     Past Surgical History:  Procedure Laterality Date  . APPENDECTOMY    . BREAST SURGERY Right 2003ish   biopsy  . CARDIAC CATHETERIZATION N/A 04/12/2016   Procedure: Right/Left Heart  Cath and Coronary Angiography;  Surgeon: Leonie Man, MD;  Location: [name];  Service: Cardiovascular;  Laterality: N/A;  . CARDIOVERSION N/A 08/16/2017   Procedure: CARDIOVERSION;  Surgeon: Arnoldo Lenis, MD;  [name];  Service: Endoscopy;  Laterality: N/A;  . CATARACT EXTRACTION Bilateral 2017  . CLIPPING OF ATRIAL APPENDAGE  07/05/2016   Procedure: CLIPPING OF ATRIAL APPENDAGE;  Surgeon: Rexene Alberts, MD;  Location: [name];  Service: Open Heart Surgery;;  . COLONOSCOPY  11/09   Dr. Gala Romney- external hemorrhoidal tags o/w normal rectal mucosa, s/p surgical resection with a normal appearing anastomosis 12cm, pan colonic diverticulum  . COLONOSCOPY N/A 06/02/2012   [id number] post low anterior resection. Pancolonic diverticulosis. Colonic polyp-tubular adenoma. Surveillance due 2019.   Marland Kitchen COLONOSCOPY N/A 10/13/2015   Procedure: COLONOSCOPY;  Surgeon: Daneil Dolin, MD;  [name];  Service: Endoscopy;  Laterality: N/A;  0930  . COLONOSCOPY WITH PROPOFOL N/A 11/20/2018   Procedure: COLONOSCOPY WITH PROPOFOL;  Surgeon: Daneil Dolin, MD;  [name];  Service: Endoscopy;  Laterality: N/A;  2:30pm  . ESOPHAGOGASTRODUODENOSCOPY  07/2007   Dr. Daiva Nakayama cervical esophageal web, schatzki ring, large hiatal hernia  . ESOPHAGOGASTRODUODENOSCOPY (EGD) WITH PROPOFOL N/A 11/20/2018   Procedure: ESOPHAGOGASTRODUODENOSCOPY (EGD) WITH PROPOFOL;  Surgeon: Daneil Dolin, MD;  [name];  Service: Endoscopy;  Laterality: N/A;  . INGUNAL HERNIA REPAIR Left   . LEG SKIN LESION  BIOPSY / EXCISION    . LOW ANTERIOR BOWEL RESECTION     NO ADJ CHEMO  . MINIMALLY INVASIVE MAZE PROCEDURE N/A 07/05/2016   Procedure: MINIMALLY INVASIVE MAZE PROCEDURE;  Surgeon: Rexene Alberts, MD;  Location: [name];  Service: Open Heart Surgery;  Laterality: N/A;  . MITRAL VALVE REPAIR Right 07/05/2016   Procedure: MINIMALLY INVASIVE MITRAL VALVE REPAIR (MVR);   Surgeon: Rexene Alberts, MD;  Location: [name];  Service: Open Heart Surgery;  Laterality: Right;  . MULTIPLE EXTRACTIONS WITH ALVEOLOPLASTY N/A 05/21/2016   Procedure: MULTIPLE EXTRACTION OF TOOTH #'S 5, 21 WITH ALVEOLOPLASTY AND GROSS DEBRIDEMENT OF TEETH;  Surgeon: Lenn Cal, DDS;  Location: [place];  Service: Oral Surgery;  Laterality: N/A;  . POLYPECTOMY  11/20/2018   Procedure: POLYPECTOMY;  Surgeon: Daneil Dolin, MD;  [name];  Service: Endoscopy;;  colon  . TEE WITHOUT CARDIOVERSION N/A 02/20/2016   Procedure: TRANSESOPHAGEAL ECHOCARDIOGRAM (TEE);  Surgeon: Arnoldo Lenis, MD;  [name];  Service: Endoscopy;  Laterality: N/A;  . TEE WITHOUT CARDIOVERSION N/A 07/05/2016  Procedure: TRANSESOPHAGEAL ECHOCARDIOGRAM (TEE);  Surgeon: Rexene Alberts, MD;  Location: [place];  Service: Open Heart Surgery;  Laterality: N/A;  . TOOTH EXTRACTION    . TUBAL LIGATION    . VEIN LIGATION AND STRIPPING       Allergies  Allergen Reactions  . Iohexol Hives, Shortness Of Breath and Other (See Comments)     Pt. had a severe allergic reaction to IV contrast the last time she was injected and had to be seen in the ER.   Marland Kitchen Hydrocodone-Acetaminophen Hives  . Nabumetone Rash  . Prednisone Rash      Family History  Problem Relation Age of Onset  . Breast cancer Mother   . Rheum arthritis Father   . Cancer - Lung Sister   . Brain cancer Sister   . Prostate cancer Brother   . Aneurysm Brother   . Colon cancer Neg Hx      Social History [name] [name] reports that she has never smoked. She has never used smokeless tobacco. [name] [name] reports no history of alcohol use.   Review of Systems CONSTITUTIONAL: No weight loss, fever, chills, weakness or fatigue.  HEENT: Eyes: No visual loss, blurred vision, double vision or yellow sclerae.No hearing loss, sneezing, congestion, runny nose or sore throat.  SKIN: No rash or itching.  CARDIOVASCULAR: per  hpi RESPIRATORY: No shortness of breath, cough or sputum.  GASTROINTESTINAL: No anorexia, nausea, vomiting or diarrhea. No abdominal pain or blood.  GENITOURINARY: No burning on urination, no polyuria NEUROLOGICAL: No headache, dizziness, syncope, paralysis, ataxia, numbness or tingling in the extremities. No change in bowel or bladder control.  MUSCULOSKELETAL: No muscle, back pain, joint pain or stiffness.  LYMPHATICS: No enlarged nodes. No history of splenectomy.  PSYCHIATRIC: No history of depression or anxiety.  ENDOCRINOLOGIC: No reports of sweating, cold or heat intolerance. No polyuria or polydipsia.  Marland Kitchen   Physical Examination Today's Vitals   10/06/19 1417  BP: 124/68  Pulse: 88  SpO2: 95%  Weight: 129 lb (58.5 kg)  Height: 5\' 1"  (1.549 m)   Body mass index is 24.37 kg/m.  Gen: resting comfortably, no acute distress HEENT: no scleral icterus, pupils equal round and reactive, no palptable cervical adenopathy,  CV: RRR, no m/r/g, no jvd Resp: Clear to auscultation bilaterally GI: abdomen is soft, non-tender, non-distended, normal bowel sounds, no hepatosplenomegaly MSK: extremities are warm, no edema.  Skin: warm, no rash Neuro:  no focal deficits Psych: appropriate affect      Assessment and Plan   1.Elevated blood pressure /Headaches - don't believe the headache was due to HTN,SBPs only in the 160s. She does not have a history of HTN. I think the bp was a response to the headache.  - refer to neuro to evaluate, has had some recurrent headaches since ER visit - bp today without pain is WNL, does not require meds at this time.      Arnoldo Lenis, MD.

## 2019-10-06 NOTE — Patient Instructions (Signed)
Your physician wants you to follow-up in: [place]  Your physician recommends that you continue on your current medications as directed. Please refer to the Current Medication list given to you today.  You have been referred to NEUROLOGY   Thank you for choosing [facility]!!

## 2019-10-21 ENCOUNTER — Ambulatory Visit (INDEPENDENT_AMBULATORY_CARE_PROVIDER_SITE_OTHER): Payer: Medicare Other | Admitting: *Deleted

## 2019-10-21 DIAGNOSIS — Z9889 Other specified postprocedural states: Secondary | ICD-10-CM | POA: Diagnosis not present

## 2019-10-21 DIAGNOSIS — I4819 Other persistent atrial fibrillation: Secondary | ICD-10-CM

## 2019-10-21 DIAGNOSIS — J449 Chronic obstructive pulmonary disease, unspecified: Secondary | ICD-10-CM | POA: Diagnosis not present

## 2019-10-21 DIAGNOSIS — E7800 Pure hypercholesterolemia, unspecified: Secondary | ICD-10-CM | POA: Diagnosis not present

## 2019-10-21 DIAGNOSIS — M159 Polyosteoarthritis, unspecified: Secondary | ICD-10-CM | POA: Diagnosis not present

## 2019-10-21 DIAGNOSIS — Z5181 Encounter for therapeutic drug level monitoring: Secondary | ICD-10-CM

## 2019-10-21 DIAGNOSIS — I4891 Unspecified atrial fibrillation: Secondary | ICD-10-CM | POA: Diagnosis not present

## 2019-10-21 LAB — POCT INR: INR: 2 (ref 2.0–3.0)

## 2019-10-21 NOTE — Patient Instructions (Signed)
Take 3 tablets tonight then resume 2 tablets daily except 3 tablets on Tuesdays and Fridays.  Recheck in 6 weeks Pt not interested in Eliquis at this time due to cost.

## 2019-11-09 DIAGNOSIS — F419 Anxiety disorder, unspecified: Secondary | ICD-10-CM | POA: Diagnosis not present

## 2019-11-09 DIAGNOSIS — R519 Headache, unspecified: Secondary | ICD-10-CM | POA: Diagnosis not present

## 2019-11-09 DIAGNOSIS — G43909 Migraine, unspecified, not intractable, without status migrainosus: Secondary | ICD-10-CM | POA: Diagnosis not present

## 2019-11-09 DIAGNOSIS — I10 Essential (primary) hypertension: Secondary | ICD-10-CM | POA: Diagnosis not present

## 2019-11-13 ENCOUNTER — Other Ambulatory Visit (HOSPITAL_COMMUNITY)
Admission: RE | Admit: 2019-11-13 | Discharge: 2019-11-13 | Disposition: A | Discharge status: Home / Self Care | Payer: Medicare Other | Source: Ambulatory Visit | Attending: "Endocrinology | Admitting: "Endocrinology

## 2019-11-13 ENCOUNTER — Other Ambulatory Visit: Payer: Self-pay

## 2019-11-13 DIAGNOSIS — E032 Hypothyroidism due to medicaments and other exogenous substances: Secondary | ICD-10-CM | POA: Insufficient documentation

## 2019-11-13 LAB — T4, FREE: Free T4: 1.13 ng/dL — ABNORMAL HIGH (ref 0.61–1.12)

## 2019-11-13 LAB — TSH: TSH: 1.141 u[IU]/mL (ref 0.350–4.500)

## 2019-11-17 ENCOUNTER — Encounter: Payer: Self-pay | Admitting: Nurse Practitioner

## 2019-11-17 ENCOUNTER — Ambulatory Visit (INDEPENDENT_AMBULATORY_CARE_PROVIDER_SITE_OTHER): Payer: Medicare Other | Admitting: Nurse Practitioner

## 2019-11-17 ENCOUNTER — Other Ambulatory Visit: Payer: Self-pay

## 2019-11-17 VITALS — BP 114/64 | HR 80 | Ht 61.0 in | Wt 129.8 lb

## 2019-11-17 DIAGNOSIS — E032 Hypothyroidism due to medicaments and other exogenous substances: Secondary | ICD-10-CM

## 2019-11-17 MED ORDER — LEVOTHYROXINE SODIUM 75 MCG PO TABS: 37.5000 ug | ORAL_TABLET | Freq: Every day | ORAL | 3 refills | Status: DC

## 2019-11-17 NOTE — Progress Notes (Signed)
11/17/2019, 10:33 AM                 Endocrinology follow-up   Subjective:    Patient ID: [name] [name], female    DOB: 12-02-1940, PCP Glenda Chroman, MD   Past Medical History:  Diagnosis Date  . Anxiety   . Arthritis   . Asthma   . Atrial fibrillation, persistent ([place])   . Chronic diastolic congestive heart failure ([place])   . Colon adenoma   . Colon cancer ([place])    status post low anterior resection, limited stage disease requiring no adjuvant therapy  . COPD (chronic obstructive pulmonary disease) ([place])   . Depression   . Diverticulosis   . DM (dermatomyositis)   . DVT (deep venous thrombosis) ([name])    in leg- long time ago  . Dyspnea    with activity  . Dysrhythmia   . Esophageal dysphagia   . GERD (gastroesophageal reflux disease)   . Headache   . Heart murmur   . Hematuria   . Hemorrhoids   . Hiatal hernia   . History of kidney stones    x 2  . Hypercholesterolemia   . Incidental pulmonary nodule 05/08/2016   8 mm vague opacity RML noted on CT scan  . Mitral regurgitation   . PONV (postoperative nausea and vomiting) 2003 ish    with breast biopsy  . S/P minimally invasive maze operation for atrial fibrillation 07/05/2016   Complete bilateral atrial lesion set using cryothermy and bipolar radiofrequency ablation with clipping of LA appendage via right mini thoracotomy approach  . 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 Schatzki's ring   . Tricuspid regurgitation    Past Surgical History:  Procedure Laterality Date  . APPENDECTOMY    . BREAST SURGERY Right 2003ish   biopsy  . CARDIAC CATHETERIZATION N/A 04/12/2016   Procedure: Right/Left Heart Cath and Coronary Angiography;  Surgeon: Leonie Man, MD;  Location: [facility];   Service: Cardiovascular;  Laterality: N/A;  . CARDIOVERSION N/A 08/16/2017   Procedure: CARDIOVERSION;  Surgeon: Arnoldo Lenis, MD;  Location: [name];  Service: Endoscopy;  Laterality: N/A;  . CATARACT EXTRACTION Bilateral 2017  . CLIPPING OF ATRIAL APPENDAGE  07/05/2016   Procedure: CLIPPING OF ATRIAL APPENDAGE;  Surgeon: Rexene Alberts, MD;  Location: [facility];  Service: Open Heart Surgery;;  . COLONOSCOPY  11/09   Dr. Gala Romney- external hemorrhoidal tags o/w normal rectal mucosa, s/p surgical resection with a normal appearing anastomosis 12cm, pan colonic diverticulum  . COLONOSCOPY N/A 06/02/2012   ZJI:RCVELF post low anterior resection. Pancolonic diverticulosis. Colonic polyp-tubular adenoma. Surveillance due 2019.   Marland Kitchen COLONOSCOPY N/A 10/13/2015   Procedure: COLONOSCOPY;  Surgeon: Daneil Dolin, MD;  [name];  Service: Endoscopy;  Laterality: N/A;  0930  . COLONOSCOPY WITH PROPOFOL N/A 11/20/2018   Procedure: COLONOSCOPY WITH PROPOFOL;  Surgeon: Daneil Dolin, MD;  [name];  Service: Endoscopy;  Laterality: N/A;  2:30pm  . ESOPHAGOGASTRODUODENOSCOPY  07/2007   Dr. Daiva Nakayama cervical esophageal web, schatzki  ring, large hiatal hernia  . ESOPHAGOGASTRODUODENOSCOPY (EGD) WITH PROPOFOL N/A 11/20/2018   Procedure: ESOPHAGOGASTRODUODENOSCOPY (EGD) WITH PROPOFOL;  Surgeon: Daneil Dolin, MD;  [name];  Service: Endoscopy;  Laterality: N/A;  . INGUNAL HERNIA REPAIR Left   . LEG SKIN LESION  BIOPSY / EXCISION    . LOW ANTERIOR BOWEL RESECTION     NO ADJ CHEMO  . MINIMALLY INVASIVE MAZE PROCEDURE N/A 07/05/2016   Procedure: MINIMALLY INVASIVE MAZE PROCEDURE;  Surgeon: Rexene Alberts, MD;  Location: [facility];  Service: Open Heart Surgery;  Laterality: N/A;  . MITRAL VALVE REPAIR Right 07/05/2016   Procedure: MINIMALLY INVASIVE MITRAL VALVE REPAIR (MVR);  Surgeon: Rexene Alberts, MD;  Location: [facility];  Service: Open Heart Surgery;  Laterality: Right;   . MULTIPLE EXTRACTIONS WITH ALVEOLOPLASTY N/A 05/21/2016   Procedure: MULTIPLE EXTRACTION OF TOOTH #'S 5, 21 WITH ALVEOLOPLASTY AND GROSS DEBRIDEMENT OF TEETH;  Surgeon: Lenn Cal, DDS;  Location: [facility];  Service: Oral Surgery;  Laterality: N/A;  . POLYPECTOMY  11/20/2018   Procedure: POLYPECTOMY;  Surgeon: Daneil Dolin, MD;  [name];  Service: Endoscopy;;  colon  . TEE WITHOUT CARDIOVERSION N/A 02/20/2016   Procedure: TRANSESOPHAGEAL ECHOCARDIOGRAM (TEE);  Surgeon: Arnoldo Lenis, MD;  [name];  Service: Endoscopy;  Laterality: N/A;  . TEE WITHOUT CARDIOVERSION N/A 07/05/2016   Procedure: TRANSESOPHAGEAL ECHOCARDIOGRAM (TEE);  Surgeon: Rexene Alberts, MD;  Location: [facility];  Service: Open Heart Surgery;  Laterality: N/A;  . TOOTH EXTRACTION    . TUBAL LIGATION    . VEIN LIGATION AND STRIPPING     Social History   Socioeconomic History  . Marital status: Married    Spouse name: Not on file  . Number of children: 2  . Years of education: Not on file  . Highest education level: Not on file  Occupational History  . Not on file  Tobacco Use  . Smoking status: Never Smoker  . Smokeless tobacco: Never Used  Vaping Use  . Vaping Use: Never used  Substance and Sexual Activity  . Alcohol use: No    Alcohol/week: 0.0 standard drinks  . Drug use: No  . Sexual activity: Not on file  Other Topics Concern  . Not on file  Social History Narrative  . Not on file   Social Determinants of Health   Financial Resource Strain:   . Difficulty of Paying Living Expenses:   Food Insecurity:   . Worried About Charity fundraiser in the Last Year:   . Arboriculturist in the Last Year:   Transportation Needs:   . Film/video editor (Medical):   Marland Kitchen Lack of Transportation (Non-Medical):   Physical Activity:   . Days of Exercise per Week:   . Minutes of Exercise per Session:   Stress:   . Feeling of Stress :   Social Connections:   . Frequency of  Communication with Friends and Family:   . Frequency of Social Gatherings with Friends and Family:   . Attends Religious Services:   . Active Member of Clubs or Organizations:   . Attends Archivist Meetings:   Marland Kitchen Marital Status:    Outpatient Encounter Medications as of 11/17/2019  Medication Sig  . acetaminophen (TYLENOL) 500 MG tablet Take 1,000 mg by mouth 2 (two) times daily as needed for moderate pain or headache.   . albuterol (PROVENTIL) (2.5 MG/3ML) 0.083% nebulizer solution Take  2.5 mg by nebulization every 6 (six) hours as needed for wheezing or shortness of breath.  . ALPRAZolam (XANAX) 0.5 MG tablet Take 0.5 mg by mouth at bedtime.   Marland Kitchen aspirin EC 81 MG EC tablet Take 1 tablet (81 mg total) by mouth daily.  . calcium carbonate (OS-CAL) 600 MG TABS tablet Take 600 mg by mouth 2 (two) times daily with a meal.  . Coenzyme Q10 100 MG TABS Take 100 mg by mouth every evening.   Marland Kitchen levothyroxine (SYNTHROID) 75 MCG tablet Take 0.5 tablets (37.5 mcg total) by mouth daily before breakfast.  . Multiple Vitamin (MULTIVITAMIN) tablet Take 1 tablet by mouth daily.    . pantoprazole (PROTONIX) 40 MG tablet Take 1 tablet (40 mg total) by mouth daily.  Vladimir Faster Glycol-Propyl Glycol (SYSTANE ULTRA) 0.4-0.3 % SOLN Place 1 drop into both eyes daily as needed (dry eyes).   . simvastatin (ZOCOR) 40 MG tablet Take 40 mg daily by mouth.  . sodium chloride (OCEAN) 0.65 % SOLN nasal spray Place 1 spray into both nostrils daily as needed for congestion.   Marland Kitchen warfarin (COUMADIN) 2.5 MG tablet TAKE 2 TABLETS BY MOUTH EVERY DAY EXCEPT TAKE 3 TABLETS EVERY DAY ON TUESDAY AND FRIDAY OR AS DIRECTED  . [DISCONTINUED] levothyroxine (SYNTHROID) 50 MCG tablet Take 1 tablet (50 mcg total) by mouth daily before breakfast.   No facility-administered encounter medications on file as of 11/17/2019.   ALLERGIES: Allergies  Allergen Reactions  . Iohexol Hives, Shortness Of Breath and Other (See Comments)      Pt. had a severe allergic reaction to IV contrast the last time she was injected and had to be seen in the ER.   Marland Kitchen Hydrocodone-Acetaminophen Hives  . Nabumetone Rash  . Prednisone Rash    VACCINATION STATUS:  There is no immunization history on file for this patient.  HPI [name] [name] is 79 y.o. female who is being seen in follow-up hypothyroidism.  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  She is currently taking levothyroxine 50 mcg p.o. daily before breakfast.  She reports random palpitations that come and go.  She denies weight loss, tremors, or heat and cold intolerance.    - She is on multiple medications including anticoagulation with warfarin related to her cardiac surgery for tricuspid regurgitation as well as atrial fibrillation. -Her current medication list does not include active amiodarone.    Review of Systems  Constitutional: Negative for malaise/fatigue and weight loss.  Cardiovascular: Positive for palpitations.  Neurological: Negative for tremors and headaches.  Psychiatric/Behavioral: Negative for depression. The patient is not nervous/anxious.       Objective:    BP (!) 114/64 (BP Location: Left Arm, Patient Position: Sitting)   Pulse 80   Ht 5\' 1"  (1.549 m)   Wt 129 lb 12.8 oz (58.9 kg)   BMI 24.53 kg/m   Wt Readings from Last 3 Encounters:  11/17/19 129 lb 12.8 oz (58.9 kg)  10/06/19 129 lb (58.5 kg)  08/18/19 128 lb 3.2 oz (58.2 kg)    Physical Exam- Limited  Constitutional:  Body mass index is 24.53 kg/m. , not in acute distress, normal state of mind Eyes:  EOMI, no exophthalmos Neck: Supple Thyroid: No gross goiter Cardiovascular: + Irregular HR (hx of afib), no murmers, rubs, or gallops  Respiratory: Adequate breathing efforts, no crackles, rales, ronchi Musculoskeletal: no gross deformities,  strength intact in all four extremities, no gross restriction of joint movements Skin:  no rashes, no hyperemia Neurological: no tremor with outstretched hands,    CMP     Component Value Date/Time   NA 144 08/12/2017 1409   K 3.8 08/12/2017 1409   CL 100 (L) 08/12/2017 1409   CO2 27 08/12/2017 1409   GLUCOSE 96 08/12/2017 1409   BUN 15 08/12/2017 1409   CREATININE 1.01 (H) 08/12/2017 1409   CALCIUM 9.2 08/12/2017 1409   PROT 6.7 07/03/2016 1157   ALBUMIN 3.8 07/03/2016 1157   AST 23 07/03/2016 1157   ALT 19 07/03/2016 1157   ALKPHOS 113 07/03/2016 1157   BILITOT 0.5 07/03/2016 1157   GFRNONAA 52 (L) 08/12/2017 1409   GFRAA >60 08/12/2017 1409    Diabetic Labs (most recent): Lab Results  Component Value Date   HGBA1C 5.6 07/03/2016    Lab Results  Component Value Date   TSH 1.141 11/13/2019   TSH 2.202 05/13/2019   TSH 2.146 01/07/2019   TSH 0.084 (L) 09/29/2018   TSH 0.311 (L) 06/30/2018   TSH 2.700 03/13/2017   TSH 3.327 07/27/2016   FREET4 1.13 (H) 11/13/2019   FREET4 0.95 05/13/2019   FREET4 1.09 01/07/2019   FREET4 1.27 09/29/2018   FREET4 1.23 06/30/2018    Recent Results (from the past 2160 hour(s))  POCT INR     Status: Normal   Collection Time: 08/25/19  9:16 AM  Result Value Ref Range   INR 2.1 2.0 - 3.0  POCT INR     Status: Normal   Collection Time: 09/23/19  9:12 AM  Result Value Ref Range   INR 2.2 2.0 - 3.0  POCT INR     Status: Normal   Collection Time: 10/21/19  9:41 AM  Result Value Ref Range   INR 2.0 2.0 - 3.0  TSH     Status: None   Collection Time: 11/13/19 11:57 AM  Result Value Ref Range   TSH 1.141 0.350 - 4.500 uIU/mL    Comment: Performed by a 3rd Generation assay with a functional sensitivity of <=0.01 uIU/mL. Performed at [facility], [address] [place], [place] [address]   T4, free     Status: Abnormal   Collection Time: 11/13/19 11:57 AM  Result Value Ref Range   Free T4 1.13 (H) 0.61 - 1.12 ng/dL    Comment:  (NOTE) Biotin ingestion may interfere with free T4 tests. If the results are inconsistent with the TSH level, previous test results, or the clinical presentation, then consider biotin interference. If needed, order repeat testing after stopping biotin. Performed at [facility], [place] [address], [place], [place] [address]      Assessment & Plan:   1.  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  She is advised to decrease her levothyroxine to 37.5 mcg p.o. daily before breakfast.  This medication does not come in that dose, therefore patient advised to cut a 75 mcg tablet in half.  Will recheck thyroid studies before next visit in 3 months.   - We discussed about the correct intake of her thyroid hormone, on empty stomach at fasting, with water, separated by at least 30 minutes from breakfast and other medications,  and separated by more than 4 hours from calcium, iron, multivitamins, acid reflux medications (PPIs). -Patient is made aware of the fact that thyroid hormone replacement is  needed for life, dose to be adjusted by periodic monitoring of thyroid function tests.   - I advised her  to maintain close follow up with Glenda Chroman, MD for primary care needs.  I encouraged her to resume her follow-up with her cardiologist regarding her history of atrial fibrillation which required treatment with amiodarone.      -    - Time spent on this patient care encounter:  20 minutes of which 50% was spent in  counseling and the rest reviewing  her current and  previous labs / studies and medications  doses and developing a plan for long term care. Khloi Rawl Urquilla  participated in the discussions, expressed understanding, and voiced agreement with the above plans.  All questions were answered to her satisfaction. she is encouraged to contact clinic should she have any questions or concerns  prior to her return visit.    Follow up plan: Return in about 3 months (around 02/17/2020) for thyroid follow up.  Needs labs 1 week prior to visit.Rayetta Pigg, FNP-BC [facility] Phone: [phone] Fax: [phone]    11/17/2019, 10:33 AM  This note was partially dictated with voice recognition software. Similar sounding words can be transcribed inadequately or may not  be corrected upon review.

## 2019-11-20 DIAGNOSIS — E7800 Pure hypercholesterolemia, unspecified: Secondary | ICD-10-CM | POA: Diagnosis not present

## 2019-11-20 DIAGNOSIS — M159 Polyosteoarthritis, unspecified: Secondary | ICD-10-CM | POA: Diagnosis not present

## 2019-11-20 DIAGNOSIS — I4891 Unspecified atrial fibrillation: Secondary | ICD-10-CM | POA: Diagnosis not present

## 2019-11-20 DIAGNOSIS — J449 Chronic obstructive pulmonary disease, unspecified: Secondary | ICD-10-CM | POA: Diagnosis not present

## 2019-11-23 DIAGNOSIS — I4891 Unspecified atrial fibrillation: Secondary | ICD-10-CM | POA: Diagnosis not present

## 2019-11-23 DIAGNOSIS — J449 Chronic obstructive pulmonary disease, unspecified: Secondary | ICD-10-CM | POA: Diagnosis not present

## 2019-11-23 DIAGNOSIS — M159 Polyosteoarthritis, unspecified: Secondary | ICD-10-CM | POA: Diagnosis not present

## 2019-11-23 DIAGNOSIS — E7800 Pure hypercholesterolemia, unspecified: Secondary | ICD-10-CM | POA: Diagnosis not present

## 2019-11-27 DIAGNOSIS — Z299 Encounter for prophylactic measures, unspecified: Secondary | ICD-10-CM | POA: Diagnosis not present

## 2019-11-27 DIAGNOSIS — Z79899 Other long term (current) drug therapy: Secondary | ICD-10-CM | POA: Diagnosis not present

## 2019-11-27 DIAGNOSIS — Z6824 Body mass index (BMI) 24.0-24.9, adult: Secondary | ICD-10-CM | POA: Diagnosis not present

## 2019-11-27 DIAGNOSIS — Z1339 Encounter for screening examination for other mental health and behavioral disorders: Secondary | ICD-10-CM | POA: Diagnosis not present

## 2019-11-27 DIAGNOSIS — Z7189 Other specified counseling: Secondary | ICD-10-CM | POA: Diagnosis not present

## 2019-11-27 DIAGNOSIS — E7800 Pure hypercholesterolemia, unspecified: Secondary | ICD-10-CM | POA: Diagnosis not present

## 2019-11-27 DIAGNOSIS — J449 Chronic obstructive pulmonary disease, unspecified: Secondary | ICD-10-CM | POA: Diagnosis not present

## 2019-11-27 DIAGNOSIS — Z1331 Encounter for screening for depression: Secondary | ICD-10-CM | POA: Diagnosis not present

## 2019-11-27 DIAGNOSIS — Z0000 Encounter for general adult medical examination without abnormal findings: Secondary | ICD-10-CM | POA: Diagnosis not present

## 2019-11-27 DIAGNOSIS — E039 Hypothyroidism, unspecified: Secondary | ICD-10-CM | POA: Diagnosis not present

## 2019-12-02 ENCOUNTER — Ambulatory Visit (INDEPENDENT_AMBULATORY_CARE_PROVIDER_SITE_OTHER): Payer: Medicare Other | Admitting: *Deleted

## 2019-12-02 ENCOUNTER — Other Ambulatory Visit: Payer: Self-pay

## 2019-12-02 DIAGNOSIS — I4819 Other persistent atrial fibrillation: Secondary | ICD-10-CM | POA: Diagnosis not present

## 2019-12-02 DIAGNOSIS — Z5181 Encounter for therapeutic drug level monitoring: Secondary | ICD-10-CM | POA: Diagnosis not present

## 2019-12-02 DIAGNOSIS — Z9889 Other specified postprocedural states: Secondary | ICD-10-CM

## 2019-12-02 LAB — POCT INR: INR: 2.6 (ref 2.0–3.0)

## 2019-12-02 NOTE — Patient Instructions (Signed)
Continue warfarin 2 tablets daily except 3 tablets on Tuesdays and Fridays.  Recheck in 6 weeks Pt not interested in Eliquis at this time due to cost.

## 2020-01-04 DIAGNOSIS — M1711 Unilateral primary osteoarthritis, right knee: Secondary | ICD-10-CM | POA: Diagnosis not present

## 2020-01-14 ENCOUNTER — Ambulatory Visit (INDEPENDENT_AMBULATORY_CARE_PROVIDER_SITE_OTHER): Payer: Medicare Other | Admitting: *Deleted

## 2020-01-14 DIAGNOSIS — Z5181 Encounter for therapeutic drug level monitoring: Secondary | ICD-10-CM

## 2020-01-14 DIAGNOSIS — I4819 Other persistent atrial fibrillation: Secondary | ICD-10-CM

## 2020-01-14 DIAGNOSIS — Z9889 Other specified postprocedural states: Secondary | ICD-10-CM

## 2020-01-14 LAB — POCT INR: INR: 3.6 — AB (ref 2.0–3.0)

## 2020-01-14 NOTE — Patient Instructions (Signed)
Hold warfarin tonight then resume 2 tablets daily except 3 tablets on Tuesdays and Fridays.  Recheck in 3 weeks Pt not interested in Eliquis at this time due to cost.

## 2020-01-21 DIAGNOSIS — M159 Polyosteoarthritis, unspecified: Secondary | ICD-10-CM | POA: Diagnosis not present

## 2020-01-21 DIAGNOSIS — J449 Chronic obstructive pulmonary disease, unspecified: Secondary | ICD-10-CM | POA: Diagnosis not present

## 2020-01-21 DIAGNOSIS — E7800 Pure hypercholesterolemia, unspecified: Secondary | ICD-10-CM | POA: Diagnosis not present

## 2020-01-21 DIAGNOSIS — I4891 Unspecified atrial fibrillation: Secondary | ICD-10-CM | POA: Diagnosis not present

## 2020-01-25 DIAGNOSIS — H40013 Open angle with borderline findings, low risk, bilateral: Secondary | ICD-10-CM | POA: Diagnosis not present

## 2020-01-28 DIAGNOSIS — R599 Enlarged lymph nodes, unspecified: Secondary | ICD-10-CM | POA: Diagnosis not present

## 2020-01-28 DIAGNOSIS — D010 Carcinoma in situ of colon: Secondary | ICD-10-CM | POA: Diagnosis not present

## 2020-01-29 DIAGNOSIS — K449 Diaphragmatic hernia without obstruction or gangrene: Secondary | ICD-10-CM | POA: Diagnosis not present

## 2020-01-29 DIAGNOSIS — K4090 Unilateral inguinal hernia, without obstruction or gangrene, not specified as recurrent: Secondary | ICD-10-CM | POA: Diagnosis not present

## 2020-01-29 DIAGNOSIS — R198 Other specified symptoms and signs involving the digestive system and abdomen: Secondary | ICD-10-CM | POA: Diagnosis not present

## 2020-01-29 DIAGNOSIS — K5730 Diverticulosis of large intestine without perforation or abscess without bleeding: Secondary | ICD-10-CM | POA: Diagnosis not present

## 2020-01-29 DIAGNOSIS — I700 Atherosclerosis of aorta: Secondary | ICD-10-CM | POA: Diagnosis not present

## 2020-01-29 DIAGNOSIS — R590 Localized enlarged lymph nodes: Secondary | ICD-10-CM | POA: Diagnosis not present

## 2020-02-02 DIAGNOSIS — R935 Abnormal findings on diagnostic imaging of other abdominal regions, including retroperitoneum: Secondary | ICD-10-CM | POA: Diagnosis not present

## 2020-02-02 DIAGNOSIS — Z7189 Other specified counseling: Secondary | ICD-10-CM | POA: Diagnosis not present

## 2020-02-02 DIAGNOSIS — Z92241 Personal history of systemic steroid therapy: Secondary | ICD-10-CM | POA: Diagnosis not present

## 2020-02-02 DIAGNOSIS — R599 Enlarged lymph nodes, unspecified: Secondary | ICD-10-CM | POA: Diagnosis not present

## 2020-02-02 DIAGNOSIS — Z6824 Body mass index (BMI) 24.0-24.9, adult: Secondary | ICD-10-CM | POA: Diagnosis not present

## 2020-02-03 ENCOUNTER — Ambulatory Visit (INDEPENDENT_AMBULATORY_CARE_PROVIDER_SITE_OTHER): Payer: Medicare Other | Admitting: *Deleted

## 2020-02-03 DIAGNOSIS — Z9889 Other specified postprocedural states: Secondary | ICD-10-CM | POA: Diagnosis not present

## 2020-02-03 DIAGNOSIS — Z5181 Encounter for therapeutic drug level monitoring: Secondary | ICD-10-CM | POA: Diagnosis not present

## 2020-02-03 DIAGNOSIS — I4819 Other persistent atrial fibrillation: Secondary | ICD-10-CM | POA: Diagnosis not present

## 2020-02-03 LAB — POCT INR: INR: 3.5 — AB (ref 2.0–3.0)

## 2020-02-03 NOTE — Patient Instructions (Signed)
Hold warfarin tonight then decrease dose to 2 tablets daily  Recheck in 3 weeks Pt not interested in Eliquis at this time due to cost.

## 2020-02-15 DIAGNOSIS — U071 COVID-19: Secondary | ICD-10-CM | POA: Diagnosis not present

## 2020-02-15 DIAGNOSIS — R059 Cough, unspecified: Secondary | ICD-10-CM | POA: Diagnosis not present

## 2020-02-15 DIAGNOSIS — B349 Viral infection, unspecified: Secondary | ICD-10-CM | POA: Diagnosis not present

## 2020-02-15 DIAGNOSIS — Z6824 Body mass index (BMI) 24.0-24.9, adult: Secondary | ICD-10-CM | POA: Diagnosis not present

## 2020-02-16 ENCOUNTER — Telehealth: Payer: Self-pay | Admitting: Cardiology

## 2020-02-16 DIAGNOSIS — Z23 Encounter for immunization: Secondary | ICD-10-CM | POA: Diagnosis not present

## 2020-02-16 DIAGNOSIS — U071 COVID-19: Secondary | ICD-10-CM | POA: Diagnosis not present

## 2020-02-16 NOTE — Telephone Encounter (Signed)
New message     Patient is there now to have Covid infusion , need clearance for patient to get the infusion

## 2020-02-16 NOTE — Telephone Encounter (Signed)
Per M.Lenze, PA-C, there is no reason not to give any patient monoclonal antibodies.Left message on voicemail to Hyder at [facility]

## 2020-02-18 ENCOUNTER — Ambulatory Visit: Payer: Medicare Other | Admitting: Nurse Practitioner

## 2020-02-19 DIAGNOSIS — E7800 Pure hypercholesterolemia, unspecified: Secondary | ICD-10-CM | POA: Diagnosis not present

## 2020-02-19 DIAGNOSIS — M159 Polyosteoarthritis, unspecified: Secondary | ICD-10-CM | POA: Diagnosis not present

## 2020-02-19 DIAGNOSIS — I4891 Unspecified atrial fibrillation: Secondary | ICD-10-CM | POA: Diagnosis not present

## 2020-02-19 DIAGNOSIS — J449 Chronic obstructive pulmonary disease, unspecified: Secondary | ICD-10-CM | POA: Diagnosis not present

## 2020-02-25 ENCOUNTER — Ambulatory Visit: Payer: Medicare Other | Admitting: Nurse Practitioner

## 2020-03-07 ENCOUNTER — Ambulatory Visit (INDEPENDENT_AMBULATORY_CARE_PROVIDER_SITE_OTHER): Payer: Medicare Other | Admitting: *Deleted

## 2020-03-07 DIAGNOSIS — I4819 Other persistent atrial fibrillation: Secondary | ICD-10-CM | POA: Diagnosis not present

## 2020-03-07 DIAGNOSIS — Z9889 Other specified postprocedural states: Secondary | ICD-10-CM | POA: Diagnosis not present

## 2020-03-07 DIAGNOSIS — M1711 Unilateral primary osteoarthritis, right knee: Secondary | ICD-10-CM | POA: Diagnosis not present

## 2020-03-07 DIAGNOSIS — Z5181 Encounter for therapeutic drug level monitoring: Secondary | ICD-10-CM | POA: Diagnosis not present

## 2020-03-07 LAB — POCT INR: INR: 3 (ref 2.0–3.0)

## 2020-03-07 NOTE — Patient Instructions (Signed)
Continue warfarin 2 tablets daily  Recheck in 3 weeks Pt not interested in Eliquis at this time due to cost.

## 2020-03-14 ENCOUNTER — Other Ambulatory Visit (HOSPITAL_COMMUNITY)
Admission: RE | Admit: 2020-03-14 | Discharge: 2020-03-14 | Disposition: A | Discharge status: Home / Self Care | Payer: Medicare Other | Source: Ambulatory Visit | Attending: Nurse Practitioner | Admitting: Nurse Practitioner

## 2020-03-14 ENCOUNTER — Other Ambulatory Visit: Payer: Self-pay

## 2020-03-14 DIAGNOSIS — E032 Hypothyroidism due to medicaments and other exogenous substances: Secondary | ICD-10-CM | POA: Insufficient documentation

## 2020-03-14 LAB — T4, FREE: Free T4: 0.99 ng/dL (ref 0.61–1.12)

## 2020-03-14 LAB — TSH: TSH: 1.065 u[IU]/mL (ref 0.350–4.500)

## 2020-03-15 DIAGNOSIS — M1711 Unilateral primary osteoarthritis, right knee: Secondary | ICD-10-CM | POA: Diagnosis not present

## 2020-03-16 DIAGNOSIS — Z23 Encounter for immunization: Secondary | ICD-10-CM | POA: Diagnosis not present

## 2020-03-21 DIAGNOSIS — F411 Generalized anxiety disorder: Secondary | ICD-10-CM | POA: Diagnosis not present

## 2020-03-21 DIAGNOSIS — I739 Peripheral vascular disease, unspecified: Secondary | ICD-10-CM | POA: Diagnosis not present

## 2020-03-21 DIAGNOSIS — J449 Chronic obstructive pulmonary disease, unspecified: Secondary | ICD-10-CM | POA: Diagnosis not present

## 2020-03-21 DIAGNOSIS — Z299 Encounter for prophylactic measures, unspecified: Secondary | ICD-10-CM | POA: Diagnosis not present

## 2020-03-21 DIAGNOSIS — Z6824 Body mass index (BMI) 24.0-24.9, adult: Secondary | ICD-10-CM | POA: Diagnosis not present

## 2020-03-21 DIAGNOSIS — M1711 Unilateral primary osteoarthritis, right knee: Secondary | ICD-10-CM | POA: Diagnosis not present

## 2020-03-21 DIAGNOSIS — I5032 Chronic diastolic (congestive) heart failure: Secondary | ICD-10-CM | POA: Diagnosis not present

## 2020-03-22 ENCOUNTER — Encounter: Payer: Self-pay | Admitting: Nurse Practitioner

## 2020-03-22 ENCOUNTER — Other Ambulatory Visit: Payer: Self-pay

## 2020-03-22 ENCOUNTER — Ambulatory Visit (INDEPENDENT_AMBULATORY_CARE_PROVIDER_SITE_OTHER): Payer: Medicare Other | Admitting: Nurse Practitioner

## 2020-03-22 VITALS — BP 148/82 | HR 84 | Ht 61.0 in | Wt 129.0 lb

## 2020-03-22 DIAGNOSIS — M159 Polyosteoarthritis, unspecified: Secondary | ICD-10-CM | POA: Diagnosis not present

## 2020-03-22 DIAGNOSIS — I4891 Unspecified atrial fibrillation: Secondary | ICD-10-CM | POA: Diagnosis not present

## 2020-03-22 DIAGNOSIS — E032 Hypothyroidism due to medicaments and other exogenous substances: Secondary | ICD-10-CM

## 2020-03-22 DIAGNOSIS — E7800 Pure hypercholesterolemia, unspecified: Secondary | ICD-10-CM | POA: Diagnosis not present

## 2020-03-22 DIAGNOSIS — J449 Chronic obstructive pulmonary disease, unspecified: Secondary | ICD-10-CM | POA: Diagnosis not present

## 2020-03-22 NOTE — Patient Instructions (Signed)

## 2020-03-22 NOTE — Progress Notes (Signed)
03/22/2020, 1:21 PM                 Endocrinology follow-up   Subjective:    Patient ID: [name] [name], female    DOB: [birth year], PCP [name] Chroman, MD   Past Medical History:  Diagnosis Date  . Anxiety   . Arthritis   . Asthma   . Atrial fibrillation, persistent ([place])   . Chronic diastolic congestive heart failure ([place])   . Colon adenoma   . Colon cancer ([place])    status post low anterior resection, limited stage disease requiring no adjuvant therapy  . COPD (chronic obstructive pulmonary disease) ([place])   . Depression   . Diverticulosis   . DM (dermatomyositis)   . DVT (deep venous thrombosis) ([name])    in leg- long time ago  . Dyspnea    with activity  . Dysrhythmia   . Esophageal dysphagia   . GERD (gastroesophageal reflux disease)   . Headache   . Heart murmur   . Hematuria   . Hemorrhoids   . Hiatal hernia   . History of kidney stones    x 2  . Hypercholesterolemia   . Incidental pulmonary nodule 05/08/2016   8 mm vague opacity RML noted on CT scan  . Mitral regurgitation   . PONV (postoperative nausea and vomiting) 2003 ish    with breast biopsy  . S/P minimally invasive maze operation for atrial fibrillation 07/05/2016   Complete bilateral atrial lesion set using cryothermy and bipolar radiofrequency ablation with clipping of LA appendage via right mini thoracotomy approach  . 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 Schatzki's ring   . Tricuspid regurgitation    Past Surgical History:  Procedure Laterality Date  . APPENDECTOMY    . BREAST SURGERY Right 2003ish   biopsy  . CARDIAC CATHETERIZATION N/A 04/12/2016   Procedure: Right/Left Heart Cath and Coronary Angiography;  Surgeon: Leonie Man, MD;  Location: [facility];   Service: Cardiovascular;  Laterality: N/A;  . CARDIOVERSION N/A 08/16/2017   Procedure: CARDIOVERSION;  Surgeon: Arnoldo Lenis, MD;  [facility]: [name];  Service: Endoscopy;  Laterality: N/A;  . CATARACT EXTRACTION Bilateral 2017  . CLIPPING OF ATRIAL APPENDAGE  07/05/2016   Procedure: CLIPPING OF ATRIAL APPENDAGE;  Surgeon: Rexene Alberts, MD;  Location: [facility];  Service: Open Heart Surgery;;  . COLONOSCOPY  11/09   Dr. Gala Romney- external hemorrhoidal tags o/w normal rectal mucosa, s/p surgical resection with a normal appearing anastomosis 12cm, pan colonic diverticulum  . COLONOSCOPY N/A 06/02/2012   [id number] post low anterior resection. Pancolonic diverticulosis. Colonic polyp-tubular adenoma. Surveillance due 2019.   Marland Kitchen COLONOSCOPY N/A 10/13/2015   Procedure: COLONOSCOPY;  Surgeon: Daneil Dolin, MD;  [name];  Service: Endoscopy;  Laterality: N/A;  0930  . COLONOSCOPY WITH PROPOFOL N/A 11/20/2018   Procedure: COLONOSCOPY WITH PROPOFOL;  Surgeon: Daneil Dolin, MD;  [name];  Service: Endoscopy;  Laterality: N/A;  2:30pm  . ESOPHAGOGASTRODUODENOSCOPY  07/2007   Dr. Daiva Nakayama cervical esophageal web, schatzki  ring, large hiatal hernia  . ESOPHAGOGASTRODUODENOSCOPY (EGD) WITH PROPOFOL N/A 11/20/2018   Procedure: ESOPHAGOGASTRODUODENOSCOPY (EGD) WITH PROPOFOL;  Surgeon: Daneil Dolin, MD;  [name];  Service: Endoscopy;  Laterality: N/A;  . INGUNAL HERNIA REPAIR Left   . LEG SKIN LESION  BIOPSY / EXCISION    . LOW ANTERIOR BOWEL RESECTION     NO ADJ CHEMO  . MINIMALLY INVASIVE MAZE PROCEDURE N/A 07/05/2016   Procedure: MINIMALLY INVASIVE MAZE PROCEDURE;  Surgeon: Rexene Alberts, MD;  Location: [facility];  Service: Open Heart Surgery;  Laterality: N/A;  . MITRAL VALVE REPAIR Right 07/05/2016   Procedure: MINIMALLY INVASIVE MITRAL VALVE REPAIR (MVR);  Surgeon: Rexene Alberts, MD;  Location: [facility];  Service: Open Heart Surgery;  Laterality: Right;   . MULTIPLE EXTRACTIONS WITH ALVEOLOPLASTY N/A 05/21/2016   Procedure: MULTIPLE EXTRACTION OF TOOTH #'S 5, 21 WITH ALVEOLOPLASTY AND GROSS DEBRIDEMENT OF TEETH;  Surgeon: Lenn Cal, DDS;  Location: [facility];  Service: Oral Surgery;  Laterality: N/A;  . POLYPECTOMY  11/20/2018   Procedure: POLYPECTOMY;  Surgeon: Daneil Dolin, MD;  [name];  Service: Endoscopy;;  colon  . TEE WITHOUT CARDIOVERSION N/A 02/20/2016   Procedure: TRANSESOPHAGEAL ECHOCARDIOGRAM (TEE);  Surgeon: Arnoldo Lenis, MD;  [name];  Service: Endoscopy;  Laterality: N/A;  . TEE WITHOUT CARDIOVERSION N/A 07/05/2016   Procedure: TRANSESOPHAGEAL ECHOCARDIOGRAM (TEE);  Surgeon: Rexene Alberts, MD;  Location: [facility];  Service: Open Heart Surgery;  Laterality: N/A;  . TOOTH EXTRACTION    . TUBAL LIGATION    . VEIN LIGATION AND STRIPPING     Social History   Socioeconomic History  . Marital status: Married    Spouse name: Not on file  . Number of children: 2  . Years of education: Not on file  . Highest education level: Not on file  Occupational History  . Not on file  Tobacco Use  . Smoking status: Never Smoker  . Smokeless tobacco: Never Used  Vaping Use  . Vaping Use: Never used  Substance and Sexual Activity  . Alcohol use: No    Alcohol/week: 0.0 standard drinks  . Drug use: No  . Sexual activity: Not on file  Other Topics Concern  . Not on file  Social History Narrative  . Not on file   Social Determinants of Health   Financial Resource Strain:   . Difficulty of Paying Living Expenses: Not on file  Food Insecurity:   . Worried About Charity fundraiser in the Last Year: Not on file  . Ran Out of Food in the Last Year: Not on file  Transportation Needs:   . Lack of Transportation (Medical): Not on file  . Lack of Transportation (Non-Medical): Not on file  Physical Activity:   . Days of Exercise per Week: Not on file  . Minutes of Exercise per Session: Not on file   Stress:   . Feeling of Stress : Not on file  Social Connections:   . Frequency of Communication with Friends and Family: Not on file  . Frequency of Social Gatherings with Friends and Family: Not on file  . Attends Religious Services: Not on file  . Active Member of Clubs or Organizations: Not on file  . Attends Archivist Meetings: Not on file  . Marital Status: Not on file   Outpatient Encounter Medications as of 03/22/2020  Medication Sig  . acetaminophen (TYLENOL) 500 MG  tablet Take 1,000 mg by mouth 2 (two) times daily as needed for moderate pain or headache.   . albuterol (PROVENTIL) (2.5 MG/3ML) 0.083% nebulizer solution Take 2.5 mg by nebulization every 6 (six) hours as needed for wheezing or shortness of breath.  . ALPRAZolam (XANAX) 0.5 MG tablet Take 0.5 mg by mouth at bedtime.   Marland Kitchen aspirin EC 81 MG EC tablet Take 1 tablet (81 mg total) by mouth daily.  . calcium carbonate (OS-CAL) 600 MG TABS tablet Take 600 mg by mouth 2 (two) times daily with a meal.  . Coenzyme Q10 100 MG TABS Take 100 mg by mouth every evening.   Marland Kitchen levothyroxine (SYNTHROID) 75 MCG tablet Take 0.5 tablets (37.5 mcg total) by mouth daily before breakfast.  . Multiple Vitamin (MULTIVITAMIN) tablet Take 1 tablet by mouth daily.    . pantoprazole (PROTONIX) 40 MG tablet Take 1 tablet (40 mg total) by mouth daily.  . simvastatin (ZOCOR) 40 MG tablet Take 40 mg daily by mouth.  . sodium chloride (OCEAN) 0.65 % SOLN nasal spray Place 1 spray into both nostrils daily as needed for congestion.   Marland Kitchen warfarin (COUMADIN) 2.5 MG tablet TAKE 2 TABLETS BY MOUTH EVERY DAY EXCEPT TAKE 3 TABLETS EVERY DAY ON TUESDAY AND FRIDAY OR AS DIRECTED  . [DISCONTINUED] Polyethyl Glycol-Propyl Glycol (SYSTANE ULTRA) 0.4-0.3 % SOLN Place 1 drop into both eyes daily as needed (dry eyes).   . benzonatate (TESSALON) 100 MG capsule Take by mouth.   No facility-administered encounter medications on file as of 03/22/2020.    ALLERGIES: Allergies  Allergen Reactions  . Iohexol Hives, Shortness Of Breath and Other (See Comments)     Pt. had a severe allergic reaction to IV contrast the last time she was injected and had to be seen in the ER.   Marland Kitchen Hydrocodone-Acetaminophen Hives  . Nabumetone Rash  . Prednisone Rash    VACCINATION STATUS:  There is no immunization history on file for this patient.  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 ) visit. Patient reports no anxiety, cold intolerance, constipation, depressed mood, diarrhea, fatigue, heat intolerance, palpitations, tremors, weight gain or weight loss. The symptoms have been stable.   [name] [name] is 79 y.o. female who is being seen in follow-up hypothyroidism.   -She denies weight loss, tremors, or heat and cold intolerance.    -She is on multiple medications including anticoagulation with warfarin related to her cardiac surgery for tricuspid regurgitation as well as atrial fibrillation. -Her current medication list does not include active amiodarone.   Review of systems  Constitutional: + Minimally fluctuating body weight,  current Body mass index is 24.37 kg/m. , no fatigue, no subjective hyperthermia, no subjective hypothermia Eyes: no blurry vision, no xerophthalmia ENT: no sore throat, no nodules palpated in throat, no dysphagia/odynophagia, no hoarseness Cardiovascular: no chest pain, no shortness of breath, intermittent palpitations (hx afib), no leg swelling Respiratory: no cough, no shortness of breath Gastrointestinal: no nausea/vomiting/diarrhea Musculoskeletal: no muscle/joint aches Skin: no rashes, no hyperemia Neurological: no tremors, no numbness, no tingling, no dizziness Psychiatric: no depression, no anxiety    Objective:    BP (!)  148/82 (BP Location: Left Arm, Patient Position: Sitting)   Pulse 84   Ht 5\' 1"  (1.549 m)   Wt 129 lb (58.5 kg)   BMI 24.37 kg/m   Wt  Readings from Last 3 Encounters:  03/22/20 129 lb (58.5 kg)  11/17/19 129 lb 12.8 oz (58.9 kg)  10/06/19 129 lb (58.5 kg)   BP Readings from Last 3 Encounters:  03/22/20 (!) 148/82  11/17/19 (!) 114/64  10/06/19 124/68   Physical Exam- Limited  Constitutional:  Body mass index is 24.37 kg/m. , not in acute distress, normal state of mind Eyes:  EOMI, no exophthalmos Neck: Supple Thyroid: No gross goiter Cardiovascular: + Irregular HR (hx of afib), no murmers, rubs, or gallops  Respiratory: Adequate breathing efforts, no crackles, rales, ronchi Musculoskeletal: no gross deformities, strength intact in all four extremities, no gross restriction of joint movements Skin:  no rashes, no hyperemia Neurological: no tremor with outstretched hands,    CMP     Component Value Date/Time   NA 144 08/12/2017 1409   K 3.8 08/12/2017 1409   CL 100 (L) 08/12/2017 1409   CO2 27 08/12/2017 1409   GLUCOSE 96 08/12/2017 1409   BUN 15 08/12/2017 1409   CREATININE 1.01 (H) 08/12/2017 1409   CALCIUM 9.2 08/12/2017 1409   PROT 6.7 07/03/2016 1157   ALBUMIN 3.8 07/03/2016 1157   AST 23 07/03/2016 1157   ALT 19 07/03/2016 1157   ALKPHOS 113 07/03/2016 1157   BILITOT 0.5 07/03/2016 1157   GFRNONAA 52 (L) 08/12/2017 1409   GFRAA >60 08/12/2017 1409    Diabetic Labs (most recent): Lab Results  Component Value Date   HGBA1C 5.6 07/03/2016    Lab Results  Component Value Date   TSH 1.065 03/14/2020   TSH 1.141 11/13/2019   TSH 2.202 05/13/2019   TSH 2.146 01/07/2019   TSH 0.084 (L) 09/29/2018   TSH 0.311 (L) 06/30/2018   TSH 2.700 03/13/2017   TSH 3.327 07/27/2016   FREET4 0.99 03/14/2020   FREET4 1.13 (H) 11/13/2019   FREET4 0.95 05/13/2019   FREET4 1.09 01/07/2019   FREET4 1.27 09/29/2018   FREET4 1.23 06/30/2018    Recent Results (from the  past 2160 hour(s))  POCT INR     Status: Abnormal   Collection Time: 01/14/20  9:24 AM  Result Value Ref Range   INR 3.6 (A) 2.0 - 3.0  POCT INR     Status: Abnormal   Collection Time: 02/03/20 10:50 AM  Result Value Ref Range   INR 3.5 (A) 2.0 - 3.0  POCT INR     Status: Abnormal   Collection Time: 03/07/20  4:00 PM  Result Value Ref Range   INR 3.0 2.0 - 3.0  TSH     Status: None   Collection Time: 03/14/20  2:53 PM  Result Value Ref Range   TSH 1.065 0.350 - 4.500 uIU/mL    Comment: Performed by a 3rd Generation assay with a functional sensitivity of <=0.01 uIU/mL. Performed at [facility], [address], [place], [place] [address]   T4, free     Status: None   Collection Time: 03/14/20  2:53 PM  Result Value Ref Range   Free T4 0.99 0.61 - 1.12 ng/dL    Comment: (NOTE) Biotin ingestion may interfere with free T4 tests. If the results are inconsistent with the TSH level, previous test results, or the clinical presentation, then consider biotin interference. If needed, order repeat testing after stopping biotin. Performed at [facility], [place] [address] [place], [place] [address]      Assessment & Plan:   1.  Hypothyroidism likely related to amiodarone treatment Her previsit thyroid  function tests are consistent with appropriate hormone replacement.  She is advised to continue her Levothyroxine 37.5 mcg po daily before breakfast.   - We discussed about the correct intake of her thyroid hormone, on empty stomach at fasting, with water, separated by at least 30 minutes from breakfast and other medications,  and separated by more than 4 hours from calcium, iron, multivitamins, acid reflux medications (PPIs). -Patient is made aware of the fact that thyroid hormone replacement is needed for life, dose to be adjusted by periodic monitoring of thyroid function tests.   - I advised her  to maintain close follow up with [name] Chroman, MD for primary care needs.  I  encouraged her to resume her follow-up with her cardiologist regarding her history of atrial fibrillation which required treatment with amiodarone.       - Time spent on this patient care encounter:  20 minutes of which 50% was spent in  counseling and the rest reviewing  her current and  previous labs / studies and medications  doses and developing a plan for long term care. [name] [name]  participated in the discussions, expressed understanding, and voiced agreement with the above plans.  All questions were answered to her satisfaction. she is encouraged to contact clinic should she have any questions or concerns prior to her return visit.   Follow up plan: Return in about 6 months (around 09/19/2020) for Thyroid follow up, Previsit labs.   [name] [name], [facility] [address] [place], [place] [address] Phone: [phone] Fax: [phone]    03/22/2020, 1:21 PM

## 2020-03-28 ENCOUNTER — Ambulatory Visit (INDEPENDENT_AMBULATORY_CARE_PROVIDER_SITE_OTHER): Payer: Medicare Other | Admitting: *Deleted

## 2020-03-28 DIAGNOSIS — Z9889 Other specified postprocedural states: Secondary | ICD-10-CM

## 2020-03-28 DIAGNOSIS — Z5181 Encounter for therapeutic drug level monitoring: Secondary | ICD-10-CM | POA: Diagnosis not present

## 2020-03-28 DIAGNOSIS — I4819 Other persistent atrial fibrillation: Secondary | ICD-10-CM

## 2020-03-28 LAB — POCT INR: INR: 1.3 — AB (ref 2.0–3.0)

## 2020-03-28 NOTE — Patient Instructions (Signed)
Take warfarin 3 tablets today and tomorrow then resume 2 tablets daily  Recheck in 10 days Pt not interested in Eliquis at this time due to cost.

## 2020-04-05 ENCOUNTER — Other Ambulatory Visit: Payer: Self-pay

## 2020-04-05 ENCOUNTER — Ambulatory Visit (INDEPENDENT_AMBULATORY_CARE_PROVIDER_SITE_OTHER): Payer: Medicare Other | Admitting: Nurse Practitioner

## 2020-04-05 ENCOUNTER — Encounter: Payer: Self-pay | Admitting: Nurse Practitioner

## 2020-04-05 VITALS — BP 122/71 | HR 86 | Temp 96.8°F | Ht 61.0 in | Wt 128.2 lb

## 2020-04-05 DIAGNOSIS — R1313 Dysphagia, pharyngeal phase: Secondary | ICD-10-CM | POA: Diagnosis not present

## 2020-04-05 DIAGNOSIS — K219 Gastro-esophageal reflux disease without esophagitis: Secondary | ICD-10-CM | POA: Diagnosis not present

## 2020-04-05 MED ORDER — PANTOPRAZOLE SODIUM 40 MG PO TBEC: 40.0000 mg | DELAYED_RELEASE_TABLET | Freq: Every day | ORAL | 3 refills | Status: DC

## 2020-04-05 NOTE — Progress Notes (Signed)
Cc'ed to pcp °

## 2020-04-05 NOTE — Progress Notes (Signed)
Referring Provider: Glenda Chroman, MD Primary Care Physician:  Glenda Chroman, MD Primary GI:  Dr. Gala Romney  Chief Complaint  Patient presents with  . Abdominal Pain    Rumbling sensation on left side, occasional acid reflux, food getting hung in throat    HPI:   [name] [name] is a 79 y.o. female who presents for follow-up on GERD and abdominal pain.  The patient was last seen in our office 08/18/2019 for the same as well as constipation.  At that time noted history of grade 3/4 hemorrhoids with significant symptoms and intermittent scant toilet tissue hematochezia treated with apothecary cream and sitz bath's.  There is a question of gastric wall thickening on CT of the abdomen and a subsequent EGD was updated and found this to be likely artifact.  Colonoscopy completed at the same time with a 5 mm polyp at her anastomosis and recommended no repeat colonoscopy due to age unless new symptoms develop.  At her last visit noted GERD doing well on Protonix, concerned about ADE scenarios a good thorough discussion about this.  Intermittent constipation about once a month and is interested in over-the-counter treatment.  Some right lower quadrant discomfort with known hernia but is not interested in surgical resection as of yet.  No other overt GI complaints.  Recommended continue Protonix indefinitely, follow-up primary care for bone density screening, likely need for indefinite PPI given large hiatal hernia, drink plenty of water, MiraLAX 1-2 times a day as needed, follow-up in 6 months.  Today she states she is doing okay overall. She unfortunately lost her husband to an acute MI two days before her birthday (10/23). She then tested positive for COVID-19 on her birthday. She has been under a lot of stress. She has occasional GERD symptoms, about once a month. States she needs to go to the dentist. States she will be chewing the it'll "go down before I'm done chewing". Has occasional dysphagia but  she feels this is because she's unable to chew thoroughly. She has her natural teeth but is missing several teeth. She isn't sure of what dentist to go to, is asking for recommendations. She has some "grumbling sound" occasionally on her right side. Denies N/V, hematochezia, melena, fever, chills, unintentional weight loss. Denies URI or flu-like symptoms. Denies loss of sense of taste or smell. The patient has not received COVID-19 vaccination(s). Denies chest pain, dyspnea, dizziness, lightheadedness, syncope, near syncope. Denies any other upper or lower GI symptoms.  Has good bowel movements, sometimes harder stools and uses MiraLAX. Needs a refill on Protonix.  Past Medical History:  Diagnosis Date  . Anxiety   . Arthritis   . Asthma   . Atrial fibrillation, persistent ([place])   . Chronic diastolic congestive heart failure ([place])   . Colon adenoma   . Colon cancer ([place])    status post low anterior resection, limited stage disease requiring no adjuvant therapy  . COPD (chronic obstructive pulmonary disease) ([place])   . Depression   . Diverticulosis   . DM (dermatomyositis)   . DVT (deep venous thrombosis) ([name])    in leg- long time ago  . Dyspnea    with activity  . Dysrhythmia   . Esophageal dysphagia   . GERD (gastroesophageal reflux disease)   . Headache   . Heart murmur   . Hematuria   . Hemorrhoids   . Hiatal hernia   . History of kidney stones    x 2  .  Hypercholesterolemia   . Incidental pulmonary nodule 05/08/2016   8 mm vague opacity RML noted on CT scan  . Mitral regurgitation   . PONV (postoperative nausea and vomiting) 2003 ish    with breast biopsy  . S/P minimally invasive maze operation for atrial fibrillation 07/05/2016   Complete bilateral atrial lesion set using cryothermy and bipolar radiofrequency ablation with clipping of LA appendage via right mini thoracotomy approach  . 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/05/2016   Complex valvuloplasty including  artificial Gore-tex neochord placement x6 and 30 mm Sorin Memo 3D ring annuloplasty via right mini thoracotomy approach  . Schatzki's ring   . Tricuspid regurgitation     Past Surgical History:  Procedure Laterality Date  . APPENDECTOMY    . BREAST SURGERY Right 2003ish   biopsy  . CARDIAC CATHETERIZATION N/A 04/12/2016   Procedure: Right/Left Heart Cath and Coronary Angiography;  Surgeon: Leonie Man, MD;  Location: [facility];  Service: Cardiovascular;  Laterality: N/A;  . CARDIOVERSION N/A 08/16/2017   Procedure: CARDIOVERSION;  Surgeon: Arnoldo Lenis, MD;  Location: [name];  Service: Endoscopy;  Laterality: N/A;  . CATARACT EXTRACTION Bilateral 2017  . CLIPPING OF ATRIAL APPENDAGE  07/05/2016   Procedure: CLIPPING OF ATRIAL APPENDAGE;  Surgeon: Rexene Alberts, MD;  Location: [facility];  Service: Open Heart Surgery;;  . COLONOSCOPY  11/09   Dr. Gala Romney- external hemorrhoidal tags o/w normal rectal mucosa, s/p surgical resection with a normal appearing anastomosis 12cm, pan colonic diverticulum  . COLONOSCOPY N/A 06/02/2012   OFB:[facility] post low anterior resection. Pancolonic diverticulosis. Colonic polyp-tubular adenoma. Surveillance due 2019.   Marland Kitchen COLONOSCOPY N/A 10/13/2015   Procedure: COLONOSCOPY;  Surgeon: Daneil Dolin, MD;  [name];  Service: Endoscopy;  Laterality: N/A;  0930  . COLONOSCOPY WITH PROPOFOL N/A 11/20/2018   Procedure: COLONOSCOPY WITH PROPOFOL;  Surgeon: Daneil Dolin, MD;  [name];  Service: Endoscopy;  Laterality: N/A;  2:30pm  . ESOPHAGOGASTRODUODENOSCOPY  07/2007   Dr. Daiva Nakayama cervical esophageal web, schatzki ring, large hiatal hernia  . ESOPHAGOGASTRODUODENOSCOPY (EGD) WITH PROPOFOL N/A 11/20/2018   Procedure: ESOPHAGOGASTRODUODENOSCOPY (EGD) WITH PROPOFOL;  Surgeon: Daneil Dolin, MD;  [name];  Service: Endoscopy;  Laterality: N/A;  . INGUNAL HERNIA REPAIR Left   . LEG SKIN LESION  BIOPSY /  EXCISION    . LOW ANTERIOR BOWEL RESECTION     NO ADJ CHEMO  . MINIMALLY INVASIVE MAZE PROCEDURE N/A 07/05/2016   Procedure: MINIMALLY INVASIVE MAZE PROCEDURE;  Surgeon: Rexene Alberts, MD;  Location: [facility];  Service: Open Heart Surgery;  Laterality: N/A;  . MITRAL VALVE REPAIR Right 07/05/2016   Procedure: MINIMALLY INVASIVE MITRAL VALVE REPAIR (MVR);  Surgeon: Rexene Alberts, MD;  Location: [facility];  Service: Open Heart Surgery;  Laterality: Right;  . MULTIPLE EXTRACTIONS WITH ALVEOLOPLASTY N/A 05/21/2016   Procedure: MULTIPLE EXTRACTION OF TOOTH #'S 5, 21 WITH ALVEOLOPLASTY AND GROSS DEBRIDEMENT OF TEETH;  Surgeon: Lenn Cal, DDS;  Location: [facility];  Service: Oral Surgery;  Laterality: N/A;  . POLYPECTOMY  11/20/2018   Procedure: POLYPECTOMY;  Surgeon: Daneil Dolin, MD;  [name];  Service: Endoscopy;;  colon  . TEE WITHOUT CARDIOVERSION N/A 02/20/2016   Procedure: TRANSESOPHAGEAL ECHOCARDIOGRAM (TEE);  Surgeon: Arnoldo Lenis, MD;  [name];  Service: Endoscopy;  Laterality: N/A;  . TEE WITHOUT CARDIOVERSION N/A 07/05/2016   Procedure: TRANSESOPHAGEAL ECHOCARDIOGRAM (  TEE);  Surgeon: Rexene Alberts, MD;  Location: [facility];  Service: Open Heart Surgery;  Laterality: N/A;  . TOOTH EXTRACTION    . TUBAL LIGATION    . VEIN LIGATION AND STRIPPING      Current Outpatient Medications  Medication Sig Dispense Refill  . acetaminophen (TYLENOL) 500 MG tablet Take 1,000 mg by mouth as needed for moderate pain or headache.    . albuterol (PROVENTIL) (2.5 MG/3ML) 0.083% nebulizer solution Take 2.5 mg by nebulization every 6 (six) hours as needed for wheezing or shortness of breath.    . ALPRAZolam (XANAX) 0.5 MG tablet Take 0.5 mg by mouth at bedtime.    Marland Kitchen aspirin EC 81 MG EC tablet Take 1 tablet (81 mg total) by mouth daily.    . calcium carbonate (OS-CAL) 600 MG TABS tablet Take 600 mg by mouth 2 (two) times daily with a meal.    . Coenzyme Q10 100 MG TABS Take  100 mg by mouth every evening.     Marland Kitchen levothyroxine (SYNTHROID) 75 MCG tablet Take 0.5 tablets (37.5 mcg total) by mouth daily before breakfast. 30 tablet 3  . Multiple Vitamin (MULTIVITAMIN) tablet Take 1 tablet by mouth daily.    . simvastatin (ZOCOR) 40 MG tablet Take 40 mg daily by mouth.    . warfarin (COUMADIN) 2.5 MG tablet TAKE 2 TABLETS BY MOUTH EVERY DAY EXCEPT TAKE 3 TABLETS EVERY DAY ON TUESDAY AND FRIDAY OR AS DIRECTED (Patient taking differently: TAKE 2 TABLETS BY MOUTH EVERY DAY) 192 tablet 3  . pantoprazole (PROTONIX) 40 MG tablet Take 1 tablet (40 mg total) by mouth daily. (Patient not taking: Reported on 04/05/2020) 90 tablet 3   No current facility-administered medications for this visit.    Allergies as of 04/05/2020 - Review Complete 04/05/2020  Allergen Reaction Noted  . Iohexol Hives, Shortness Of Breath, and Other (See Comments) 07/16/2008  . Hydrocodone-acetaminophen Hives 04/26/2010  . Nabumetone Rash 04/26/2010  . Prednisone Rash 10/13/2015    Family History  Problem Relation Age of Onset  . Breast cancer Mother   . Rheum arthritis Father   . Cancer - Lung Sister   . Brain cancer Sister   . Prostate cancer Brother   . Aneurysm Brother   . Colon cancer Neg Hx     Social History   Socioeconomic History  . Marital status: Married    Spouse name: Not on file  . Number of children: 2  . Years of education: Not on file  . Highest education level: Not on file  Occupational History  . Not on file  Tobacco Use  . Smoking status: Never Smoker  . Smokeless tobacco: Never Used  Vaping Use  . Vaping Use: Never used  Substance and Sexual Activity  . Alcohol use: No    Alcohol/week: 0.0 standard drinks  . Drug use: No  . Sexual activity: Not on file  Other Topics Concern  . Not on file  Social History Narrative  . Not on file   Social Determinants of Health   Financial Resource Strain: Not on file  Food Insecurity: Not on file  Transportation  Needs: Not on file  Physical Activity: Not on file  Stress: Not on file  Social Connections: Not on file    Subjective: Review of Systems  Constitutional: Negative for chills, fever, malaise/fatigue and weight loss.  HENT: Negative for congestion and sore throat.   Respiratory: Negative for cough and shortness of breath.   Cardiovascular:  Negative for chest pain and palpitations.  Gastrointestinal: Positive for heartburn. Negative for abdominal pain, blood in stool, diarrhea, melena, nausea and vomiting.       Dysphagia  Musculoskeletal: Negative for joint pain and myalgias.  Skin: Negative for rash.  Neurological: Negative for dizziness and weakness.  Endo/Heme/Allergies: Does not bruise/bleed easily.  Psychiatric/Behavioral: Negative for depression. The patient is not nervous/anxious.   All other systems reviewed and are negative.    Objective: BP 122/71   Pulse 86   Temp (!) 96.8 F (36 C) (Temporal)   Ht 5\' 1"  (1.549 m)   Wt 128 lb 3.2 oz (58.2 kg)   BMI 24.22 kg/m  Physical Exam Vitals and nursing note reviewed.  Constitutional:      General: She is not in acute distress.    Appearance: Normal appearance. She is well-developed and normal weight. She is not ill-appearing, toxic-appearing or diaphoretic.  HENT:     Head: Normocephalic and atraumatic.     Nose: No congestion or rhinorrhea.  Eyes:     General: No scleral icterus. Cardiovascular:     Rate and Rhythm: Normal rate and regular rhythm.     Heart sounds: Normal heart sounds.  Pulmonary:     Effort: Pulmonary effort is normal. No respiratory distress.     Breath sounds: Normal breath sounds.  Abdominal:     General: Bowel sounds are normal.     Palpations: Abdomen is soft. There is no hepatomegaly, splenomegaly or mass.     Tenderness: There is no abdominal tenderness. There is no guarding or rebound.     Hernia: No hernia is present.  Skin:    General: Skin is warm and dry.     Coloration: Skin is  not jaundiced.     Findings: No rash.  Neurological:     General: No focal deficit present.     Mental Status: She is alert and oriented to person, place, and time.  Psychiatric:        Attention and Perception: Attention normal.        Mood and Affect: Mood normal.        Speech: Speech normal.        Behavior: Behavior normal.        Thought Content: Thought content normal.        Cognition and Memory: Cognition and memory normal.      Assessment:  Very pleasant 79 year old female presents for follow-up on GERD, abdominal pain, dysphagia.  GERD previously well managed on Protonix.  Some solid food dysphagia, although this is likely more a dental issue as discussed below.  No other red flag/warning signs or symptoms.  She has unfortunately lost her husband 2 months ago, about 2 days before her birthday.  She did test positive for COVID-19 on her birthday, also about 2 months ago, but has had resolution of symptoms and had a mild form given vaccination.  GERD: Previously well managed on pantoprazole.  She has run out and is requesting a refill.  Notes occasional GERD symptoms, only about once a month.  Generally well managed and tolerable.  I will have her continue her current PPI and call us for any worsening symptoms  Dysphagia: She describes some intermittent solid food dysphagia.  States that she is missing multiple teeth and needs to see a dentist.  She feels her swallowing problems happen when she is unable to chew her food thoroughly.  We discussed chewing precautions and consumption of soft food  diet to help.  ER precautions given for persistent dysphagia/food impaction.  I recommended that she see a dentist to try to get dental care to help her be able to chew adequately.  Call for any worsening.   Plan: 1. Refill Protonix 2. Make an appointment to follow-up with a dentist for dental care 3. Patient education given on chewing precautions 4. ER precautions given for prolonged  dysphagia/food impaction 5. Call for worsening 6. Follow-up in 6 months    Thank you for allowing [name] to participate in the care of [place], DNP, AGNP-C Adult & Gerontological Nurse Practitioner [facility]   04/05/2020 3:00 PM   Disclaimer: This note was dictated with voice recognition software. Similar sounding words can inadvertently be transcribed and may not be corrected upon review.

## 2020-04-05 NOTE — Patient Instructions (Addendum)
Your health issues we discussed today were:   GERD (reflux/heartburn): 1. I sent a refill of Protonix to your pharmacy 2. Take Protonix once a day on an empty stomach 3. Call us for any worsening or severe symptoms  Dysphagia (swallowing difficulties): 1. As discussed, this may be related to needing to have dental care completed 2. I will ask in our office if somebody needs a dentist in the area that they can recommend 3. I am printing chewing precautions to help prevent sputum getting stuck 4. The biggest take away is to eat softer foods, more tender.  Rather than tough foods 5. If you get stuck and will not go forward or will not go backward and last 1 to 2 hours then proceed the emergency room 6. Call if any worsening or severe symptoms  Overall I recommend:  1. Continue other current medications 2. Return for follow-up in 6 months 3. Call us for any questions or concerns 4. Again, I am sorry for the loss of your husband!   ---------------------------------------------------------------  At [facility] we value your feedback. You may receive a survey about your visit today. Please share your experience as we strive to create trusting relationships with our patients to provide genuine, compassionate, quality care.  We appreciate your understanding and patience as we review any laboratory studies, imaging, and other diagnostic tests that are ordered as we care for you. Our office policy is 5 business days for review of these results, and any emergent or urgent results are addressed in a timely manner for your best interest. If you do not hear from our office in 1 week, please contact us.   We also encourage the use of MyChart, which contains your medical information for your review as well. If you are not enrolled in this feature, an access code is on this after visit summary for your convenience. Thank you for allowing [name] to be involved in your care.  It was great to  see you today!  I hope you have a Merry Christmas and Happy Holidays!!    ---------------------------------------------------------------     Dysphagia Eating Plan, Minced and Moist Foods This eating plan is for people with moderate swallowing problems who are transitioning from pureed to solid foods. Moist and minced foods are soft and cut into very small chunks so that they can be swallowed safely. On this eating plan, you may be instructed to drink liquids that are thickened. Work with your health care provider and your diet and nutrition specialist (dietitian) to make sure that you are following the diet safely and getting all the nutrients you need. What are tips for following this plan? General guidelines for foods   You may eat foods that are soft and moist.  Always test food texture before taking a bite. Poke food with a fork or spoon to make sure it is tender.  Take small bites. Each bite should be smaller than your little finger nail (about 4 mm by 4 mm).  If you were on a pureed food eating plan, you may still eat any of the foods included in that diet.  Avoid foods that are dry, hard, sticky, chewy, coarse, or crunchy.  Avoid foods that separate into thin liquids and solids, such as cereal with milk or chunky soups.  Avoid liquids that have seeds or chunks.  If instructed by your health care provider, thicken liquids. Follow your health care provider's instructions about what products to use, how to do this, and  to what thickness. ? You may use a commercial thickener, rice cereal, or potato flakes. ? Thickened liquids are usually a "pudding-like" consistency, or they may be as thick as honey or thick enough to eat with a spoon. Cooking  You may need to use a blender, whisk, or masher to soften some of your foods.  To moisten foods, you may add liquids while you are blending, mashing, or grinding your foods to the right consistency. These liquids include gravies,  sauces, vegetable or fruit juice, milk, half and half, or water.  Reheat foods slowly to prevent a tough crust from forming. Meal planning  Eat a variety of foods in order to get all the nutrients you need.  Follow your meal plan as told by your health care provider or dietitian. What foods are allowed? Grains Soaked soft breads without nuts or seeds. Pancakes, sweet rolls, pastries, and [place] toast that have been moistened with syrup or sauce. Well-cooked pasta, noodles, rice, and bread dressing in very small pieces and thick sauce. Soft dumplings or spaetzle in very small pieces and butter or gravy. Soft-cooked cereals. Vegetables Very soft, well-cooked vegetables in very small pieces. Soft-cooked, mashed potatoes. Thickened vegetable juice. Fruits Canned or cooked fruits that are soft or moist and do not have skin or seeds. Fresh, soft bananas. Thickened fruit juices. Meat and other protein foods Tender, moist, and finely minced or ground meats or poultry. Moist meatballs or meatloaf. Fish without bones. Scrambled, poached, or soft-cooked eggs. Tofu. [place] and meat alternatives in very small pieces. Well-cooked, moistened and mashed beans, baked beans, peas, and other legumes. Dairy Thickened milk. Cream cheese. Yogurt. Cottage cheese. Sour cream. Fats and oils Butter. Margarine. Cream for cereal, depending on liquid consistency allowed. Gravy. Cream sauces. Mayonnaise. Sweets and desserts Pudding. Custard. Ice cream and sherbet. Whipped toppings. Soft, moist cakes. Icing. Jelly. Jams and preserves without seeds. Seasoning and other foods Sauces and salsas that have soft chunks that are smaller than 83mm. Salad dressings. Casseroles with small pieces of tender meat. All seasonings and sweeteners. Beverages Anything prepared at the thickness recommended by your dietitian. What foods are not allowed? Grains Breads that are hard or have nuts or seeds. Dry biscuits, pancakes, waffles,  and bread dressing. Coarse cereals. Cereals that have nuts, seeds, dried fruits, or coconut. Sticky rice. Large pieces of pasta. Vegetables All raw vegetables. Tough, fibrous, chewy, or stringy cooked vegetables, such as celery, peas, broccoli, cabbage, Brussels sprouts, and asparagus. Potato skins. Potato and other vegetable chips. Fried or French-fried potatoes. Cooked corn and peas. Fruits Hard, crunchy, stringy, high-pulp, and juicy raw fruits such as apples, pineapple, papaya, and watermelon. Fruits with skins and seeds, such as grapes. Dried fruit and fruit leather. Meats and other protein foods Large pieces of meat. Dry, tough meats, such as bacon, sausage, and hot dogs. Chicken, [place], or fish with skin and bones. Crunchy peanut butter. Nuts. Seeds. Dairy Yogurt with nuts, seeds, or large chunks. Large chunks of cheese. Frozen desserts and milk consistency not allowed by your dietitian. Sweets and desserts Coarse, hard, chewy, or sticky desserts. Any dessert with nuts, seeds, coconut, pineapple, or dried fruit. Bread pudding. Seasoning and other foods Soups and casseroles with large chunks. Sandwiches. Pizza. Summary  Moist and minced foods can be helpful for people with moderate swallowing problems.  On the dysphagia eating plan, you may eat foods that are soft, moist, and cut into pieces smaller than 50mm by 56mm.  You may be instructed to thicken  liquids. Follow your health care provider's instructions about how to do this and to what consistency. This information is not intended to replace advice given to you by your health care provider. Make sure you discuss any questions you have with your health care provider. Document Revised: 07/31/2018 Document Reviewed: 07/20/2016 Elsevier Patient Education  [place].

## 2020-04-06 ENCOUNTER — Ambulatory Visit (INDEPENDENT_AMBULATORY_CARE_PROVIDER_SITE_OTHER): Payer: Medicare Other | Admitting: *Deleted

## 2020-04-06 DIAGNOSIS — Z9889 Other specified postprocedural states: Secondary | ICD-10-CM

## 2020-04-06 DIAGNOSIS — Z5181 Encounter for therapeutic drug level monitoring: Secondary | ICD-10-CM

## 2020-04-06 DIAGNOSIS — I4819 Other persistent atrial fibrillation: Secondary | ICD-10-CM | POA: Diagnosis not present

## 2020-04-06 LAB — POCT INR: INR: 2.2 (ref 2.0–3.0)

## 2020-04-06 NOTE — Patient Instructions (Signed)
Continue warfarin 2 tablets daily  Recheck in 3 wks Pt not interested in Eliquis at this time due to cost.

## 2020-04-08 ENCOUNTER — Telehealth: Payer: Self-pay | Admitting: Internal Medicine

## 2020-04-08 NOTE — Telephone Encounter (Signed)
Patient called and said that she received a text bout 3 medications we sent in for her and when she went to pick them up it was only one.  She was calling to find out how many she is supposed to have

## 2020-04-08 NOTE — Telephone Encounter (Signed)
Spoke with pt. Pt is aware that Pantoprazole 40 mg daily was called in by [employer], NP at the time of pts ov.

## 2020-04-13 ENCOUNTER — Encounter: Payer: Self-pay | Admitting: *Deleted

## 2020-04-13 ENCOUNTER — Encounter: Payer: Self-pay | Admitting: Cardiology

## 2020-04-13 ENCOUNTER — Ambulatory Visit (INDEPENDENT_AMBULATORY_CARE_PROVIDER_SITE_OTHER): Payer: Medicare Other | Admitting: Cardiology

## 2020-04-13 VITALS — BP 152/90 | HR 84 | Ht 61.0 in | Wt 126.8 lb

## 2020-04-13 DIAGNOSIS — I4819 Other persistent atrial fibrillation: Secondary | ICD-10-CM

## 2020-04-13 DIAGNOSIS — I5032 Chronic diastolic (congestive) heart failure: Secondary | ICD-10-CM | POA: Diagnosis not present

## 2020-04-13 DIAGNOSIS — Z9889 Other specified postprocedural states: Secondary | ICD-10-CM | POA: Diagnosis not present

## 2020-04-13 DIAGNOSIS — I10 Essential (primary) hypertension: Secondary | ICD-10-CM

## 2020-04-13 NOTE — Patient Instructions (Signed)
Your physician wants you to follow-up in: 6 MONTHS WITH DR. BRANCH You will receive a reminder letter in the mail two months in advance. If you don't receive a letter, please call our office to schedule the follow-up appointment.  Your physician has recommended you make the following change in your medication:   STOP ASPIRIN   Thank you for choosing [facility]!!     

## 2020-04-13 NOTE — Progress Notes (Signed)
Clinical Summary [name] [name] is a 78 y.o.female seen today for follow up of the following medical problem.s   1. Elevated blood pressure/Headache -seen in ER at [facility] 09/2019 with headache - severe right sided headache sudden onset. Checked her bp's at home and SBP in 160s, came to ER - in ER benign CT head. Asked to f/u with cardiology for HTN.   - home bp's usually 130s/80s-90s.    2.Paroxysmal afib/ Aflutter - s/p MAZE procedure during recent MV repair - management complicated by sinus node dysfunction. Has affected ability to take av nodal agents. Significant brady on amio, now off.   - 03/2019 event monitor with just SR, rare PACs  - no recent palpitaitons - compliant with meds   3Sinus node dysfunction - followed by EP, watchful waiting. Likely will require ppm at some point  4. Chest pain - 2017 cath minimal CAD, distal LAD bridging - occasional chest pains. Episodes in Nov, nothing since.   4. Mitral regurgitation, s/p MV repair - echo at last [facility] Jan 2017 showed normal LVEF, 3+(modarte)eccentric MR with immobile posterior leaflet.  - repeat echo 01/2016 moderate to severe MR. LVEF 60-65%. LVIDs 31. Symptoms unchanged since last visit.  - 01/2016 TEE moderate to severe eccentric MR, severe TR.  - she is s/p mitral valve repair 07/05/16, Sorin Memo 3D Ring Annuloplasty (size 58mm, catalog #[id number], serial [id number]) - repeat echo 10/2016 with LVEF 60-65%, no WMAs, normal MV repair and ring, moderate TR.    - denies any SOB/DOE. No LE edema.  - no recent bleeding.     5. Chronic diastolic HF -no recent symptoms  6.History ofGI bleed -followed by GI - notes indicate prior hemorroidal bleeding  - no recent bleeding  7. COPD -followed by Dr Luan Pulling   8. HTN -compliant with meds  Past Medical History:  Diagnosis Date  . Anxiety   . Arthritis   . Asthma   . Atrial fibrillation, persistent ([place])   . Chronic diastolic  congestive heart failure ([place])   . Colon adenoma   . Colon cancer ([place])    status post low anterior resection, limited stage disease requiring no adjuvant therapy  . COPD (chronic obstructive pulmonary disease) ([place])   . Depression   . Diverticulosis   . DM (dermatomyositis)   . DVT (deep venous thrombosis) ([name])    in leg- long time ago  . Dyspnea    with activity  . Dysrhythmia   . Esophageal dysphagia   . GERD (gastroesophageal reflux disease)   . Headache   . Heart murmur   . Hematuria   . Hemorrhoids   . Hiatal hernia   . History of kidney stones    x 2  . Hypercholesterolemia   . Incidental pulmonary nodule 05/08/2016   8 mm vague opacity RML noted on CT scan  . Mitral regurgitation   . PONV (postoperative nausea and vomiting) 2003 ish    with breast biopsy  . S/P minimally invasive maze operation for atrial fibrillation 07/05/2016   Complete bilateral atrial lesion set using cryothermy and bipolar radiofrequency ablation with clipping of LA appendage via right mini thoracotomy approach  . 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 Schatzki's ring   . Tricuspid regurgitation      Allergies  Allergen Reactions  . Iohexol Hives,  Shortness Of Breath and Other (See Comments)     Pt. had a severe allergic reaction to IV contrast the last time she was injected and had to be seen in the ER.   Marland Kitchen Hydrocodone-Acetaminophen Hives  . Nabumetone Rash  . Prednisone Rash     Current Outpatient Medications  Medication Sig Dispense Refill  . acetaminophen (TYLENOL) 500 MG tablet Take 1,000 mg by mouth as needed for moderate pain or headache.    . albuterol (PROVENTIL) (2.5 MG/3ML) 0.083% nebulizer solution Take 2.5 mg by nebulization every 6 (six) hours as needed for wheezing or shortness of breath.    . ALPRAZolam  (XANAX) 0.5 MG tablet Take 0.5 mg by mouth at bedtime.    Marland Kitchen aspirin EC 81 MG EC tablet Take 1 tablet (81 mg total) by mouth daily.    . calcium carbonate (OS-CAL) 600 MG TABS tablet Take 600 mg by mouth 2 (two) times daily with a meal.    . Coenzyme Q10 100 MG TABS Take 100 mg by mouth every evening.     Marland Kitchen levothyroxine (SYNTHROID) 75 MCG tablet Take 0.5 tablets (37.5 mcg total) by mouth daily before breakfast. 30 tablet 3  . Multiple Vitamin (MULTIVITAMIN) tablet Take 1 tablet by mouth daily.    . pantoprazole (PROTONIX) 40 MG tablet Take 1 tablet (40 mg total) by mouth daily. 90 tablet 3  . simvastatin (ZOCOR) 40 MG tablet Take 40 mg daily by mouth.    . warfarin (COUMADIN) 2.5 MG tablet TAKE 2 TABLETS BY MOUTH EVERY DAY EXCEPT TAKE 3 TABLETS EVERY DAY ON TUESDAY AND FRIDAY OR AS DIRECTED (Patient taking differently: TAKE 2 TABLETS BY MOUTH EVERY DAY) 192 tablet 3   No current facility-administered medications for this visit.     Past Surgical History:  Procedure Laterality Date  . APPENDECTOMY    . BREAST SURGERY Right 2003ish   biopsy  . CARDIAC CATHETERIZATION N/A 04/12/2016   Procedure: Right/Left Heart Cath and Coronary Angiography;  Surgeon: Leonie Man, MD;  Location: [facility];  Service: Cardiovascular;  Laterality: N/A;  . CARDIOVERSION N/A 08/16/2017   Procedure: CARDIOVERSION;  Surgeon: Arnoldo Lenis, MD;  Location: [name];  Service: Endoscopy;  Laterality: N/A;  . CATARACT EXTRACTION Bilateral 2017  . CLIPPING OF ATRIAL APPENDAGE  07/05/2016   Procedure: CLIPPING OF ATRIAL APPENDAGE;  Surgeon: Rexene Alberts, MD;  Location: [facility];  Service: Open Heart Surgery;;  . COLONOSCOPY  11/09   Dr. Gala Romney- external hemorrhoidal tags o/w normal rectal mucosa, s/p surgical resection with a normal appearing anastomosis 12cm, pan colonic diverticulum  . COLONOSCOPY N/A 06/02/2012   [id number] post low anterior resection. Pancolonic diverticulosis. Colonic polyp-tubular  adenoma. Surveillance due 2019.   Marland Kitchen COLONOSCOPY N/A 10/13/2015   Procedure: COLONOSCOPY;  Surgeon: Daneil Dolin, MD;  [name];  Service: Endoscopy;  Laterality: N/A;  0930  . COLONOSCOPY WITH PROPOFOL N/A 11/20/2018   Procedure: COLONOSCOPY WITH PROPOFOL;  Surgeon: Daneil Dolin, MD;  [name];  Service: Endoscopy;  Laterality: N/A;  2:30pm  . ESOPHAGOGASTRODUODENOSCOPY  07/2007   Dr. Daiva Nakayama cervical esophageal web, schatzki ring, large hiatal hernia  . ESOPHAGOGASTRODUODENOSCOPY (EGD) WITH PROPOFOL N/A 11/20/2018   Procedure: ESOPHAGOGASTRODUODENOSCOPY (EGD) WITH PROPOFOL;  Surgeon: Daneil Dolin, MD;  [name];  Service: Endoscopy;  Laterality: N/A;  . INGUNAL HERNIA REPAIR Left   . LEG SKIN LESION  BIOPSY / EXCISION    .  LOW ANTERIOR BOWEL RESECTION     NO ADJ CHEMO  . MINIMALLY INVASIVE MAZE PROCEDURE N/A 07/05/2016   Procedure: MINIMALLY INVASIVE MAZE PROCEDURE;  Surgeon: Rexene Alberts, MD;  Location: [facility];  Service: Open Heart Surgery;  Laterality: N/A;  . MITRAL VALVE REPAIR Right 07/05/2016   Procedure: MINIMALLY INVASIVE MITRAL VALVE REPAIR (MVR);  Surgeon: Rexene Alberts, MD;  Location: [place];  Service: Open Heart Surgery;  Laterality: Right;  . MULTIPLE EXTRACTIONS WITH ALVEOLOPLASTY N/A 05/21/2016   Procedure: MULTIPLE EXTRACTION OF TOOTH #'S 5, 21 WITH ALVEOLOPLASTY AND GROSS DEBRIDEMENT OF TEETH;  Surgeon: Lenn Cal, DDS;  Location: [place];  Service: Oral Surgery;  Laterality: N/A;  . POLYPECTOMY  11/20/2018   Procedure: POLYPECTOMY;  Surgeon: Daneil Dolin, MD;  [name];  Service: Endoscopy;;  colon  . TEE WITHOUT CARDIOVERSION N/A 02/20/2016   Procedure: TRANSESOPHAGEAL ECHOCARDIOGRAM (TEE);  Surgeon: Arnoldo Lenis, MD;  [name];  Service: Endoscopy;  Laterality: N/A;  . TEE WITHOUT CARDIOVERSION N/A 07/05/2016   Procedure: TRANSESOPHAGEAL ECHOCARDIOGRAM (TEE);  Surgeon:  Rexene Alberts, MD;  Location: [facility];  Service: Open Heart Surgery;  Laterality: N/A;  . TOOTH EXTRACTION    . TUBAL LIGATION    . VEIN LIGATION AND STRIPPING       Allergies  Allergen Reactions  . Iohexol Hives, Shortness Of Breath and Other (See Comments)     Pt. had a severe allergic reaction to IV contrast the last time she was injected and had to be seen in the ER.   Marland Kitchen Hydrocodone-Acetaminophen Hives  . Nabumetone Rash  . Prednisone Rash      Family History  Problem Relation Age of Onset  . Breast cancer Mother   . Rheum arthritis Father   . Cancer - Lung Sister   . Brain cancer Sister   . Prostate cancer Brother   . Aneurysm Brother   . Colon cancer Neg Hx      Social History [name] [name] reports that she has never smoked. She has never used smokeless tobacco. [name] [name] reports no history of alcohol use.   Review of Systems CONSTITUTIONAL: No weight loss, fever, chills, weakness or fatigue.  HEENT: Eyes: No visual loss, blurred vision, double vision or yellow sclerae.No hearing loss, sneezing, congestion, runny nose or sore throat.  SKIN: No rash or itching.  CARDIOVASCULAR: per hpi RESPIRATORY: No shortness of breath, cough or sputum.  GASTROINTESTINAL: No anorexia, nausea, vomiting or diarrhea. No abdominal pain or blood.  GENITOURINARY: No burning on urination, no polyuria NEUROLOGICAL: No headache, dizziness, syncope, paralysis, ataxia, numbness or tingling in the extremities. No change in bowel or bladder control.  MUSCULOSKELETAL: No muscle, back pain, joint pain or stiffness.  LYMPHATICS: No enlarged nodes. No history of splenectomy.  PSYCHIATRIC: No history of depression or anxiety.  ENDOCRINOLOGIC: No reports of sweating, cold or heat intolerance. No polyuria or polydipsia.  Marland Kitchen   Physical Examination Today's Vitals   04/13/20 1101  BP: (!) 152/90  Pulse: 84  SpO2: 97%  Weight: 126 lb 12.8 oz (57.5 kg)  Height: 5\' 1"  (1.549 m)   Body mass  index is 23.96 kg/m.  Gen: resting comfortably, no acute distress HEENT: no scleral icterus, pupils equal round and reactive, no palptable cervical adenopathy,  CV" RRR Resp: Clear to auscultation bilaterally GI: abdomen is soft, non-tender, non-distended, normal bowel sounds, no hepatosplenomegaly MSK: extremities are warm, no edema.  Skin: warm, no  rash Neuro:  no focal deficits Psych: appropriate affect   Diagnostic Studies     Assessment and Plan   1. AFib/Aflutter  - management limited due to sinus node dysfunction, prior bradycardia on amio. May need pacemaker at some point, followed by ep  - doing well, continue to monitor   2. Mitral regurgitation/Mitral valve repair  - s/p mitral valve repair  - no symptoms, continue to monitor   3. HTN  - elevated in clinic but home numbers at goal, continue current meds  4. Chronic diastolic HF  - no symptoms, continue current meds   Arnoldo Lenis, M.D.

## 2020-04-21 ENCOUNTER — Telehealth: Payer: Self-pay | Admitting: Cardiology

## 2020-04-21 DIAGNOSIS — I4891 Unspecified atrial fibrillation: Secondary | ICD-10-CM | POA: Diagnosis not present

## 2020-04-21 DIAGNOSIS — M159 Polyosteoarthritis, unspecified: Secondary | ICD-10-CM | POA: Diagnosis not present

## 2020-04-21 DIAGNOSIS — J449 Chronic obstructive pulmonary disease, unspecified: Secondary | ICD-10-CM | POA: Diagnosis not present

## 2020-04-21 DIAGNOSIS — E7800 Pure hypercholesterolemia, unspecified: Secondary | ICD-10-CM | POA: Diagnosis not present

## 2020-04-21 NOTE — Telephone Encounter (Signed)
Pt c/o BP issue:  if pt c/o blurred vision, one-sided weakness or slurred speech  1. What are your last 5 BP readings? 103/71 P 98   2. Are you having any other symptoms (ex. Dizziness, headache, blurred vision, passed out)? Headache   3. What is your BP issue? 103/71 BP  P 98  Patient states that she was out in the yard and started having a headache so she went to check her BP.  She is not having any other symptoms.

## 2020-04-21 NOTE — Telephone Encounter (Signed)
Spoke with patient. States she feels okay, just a headache and her b/p being 103/71 made her nervous. She will recheck in 1 hour. Advised that if she started having other symptoms such as SOB, blurred vision, chest pains, etc. That she should go to the nearest ED to be checked out.

## 2020-05-04 ENCOUNTER — Ambulatory Visit (INDEPENDENT_AMBULATORY_CARE_PROVIDER_SITE_OTHER): Payer: Medicare Other | Admitting: *Deleted

## 2020-05-04 DIAGNOSIS — Z9889 Other specified postprocedural states: Secondary | ICD-10-CM

## 2020-05-04 DIAGNOSIS — Z5181 Encounter for therapeutic drug level monitoring: Secondary | ICD-10-CM

## 2020-05-04 DIAGNOSIS — I4819 Other persistent atrial fibrillation: Secondary | ICD-10-CM | POA: Diagnosis not present

## 2020-05-04 LAB — POCT INR: INR: 1.8 — AB (ref 2.0–3.0)

## 2020-05-04 NOTE — Patient Instructions (Signed)
Increase warfarin to 2 tablets daily except 3 tablets on Wednesdays Recheck in 3 wks Pt not interested in Eliquis at this time due to cost.

## 2020-05-25 ENCOUNTER — Ambulatory Visit (INDEPENDENT_AMBULATORY_CARE_PROVIDER_SITE_OTHER): Payer: Medicare Other | Admitting: *Deleted

## 2020-05-25 DIAGNOSIS — Z5181 Encounter for therapeutic drug level monitoring: Secondary | ICD-10-CM

## 2020-05-25 DIAGNOSIS — I4819 Other persistent atrial fibrillation: Secondary | ICD-10-CM | POA: Diagnosis not present

## 2020-05-25 DIAGNOSIS — Z9889 Other specified postprocedural states: Secondary | ICD-10-CM | POA: Diagnosis not present

## 2020-05-25 LAB — POCT INR: INR: 2.1 (ref 2.0–3.0)

## 2020-05-25 NOTE — Patient Instructions (Signed)
Continue warfarin 2 tablets daily except 3 tablets on Wednesdays Recheck in 4 wks Pt not interested in Eliquis at this time due to cost. 

## 2020-05-27 DIAGNOSIS — I5032 Chronic diastolic (congestive) heart failure: Secondary | ICD-10-CM | POA: Diagnosis not present

## 2020-05-27 DIAGNOSIS — F411 Generalized anxiety disorder: Secondary | ICD-10-CM | POA: Diagnosis not present

## 2020-05-27 DIAGNOSIS — Z299 Encounter for prophylactic measures, unspecified: Secondary | ICD-10-CM | POA: Diagnosis not present

## 2020-05-27 DIAGNOSIS — K7031 Alcoholic cirrhosis of liver with ascites: Secondary | ICD-10-CM | POA: Diagnosis not present

## 2020-05-27 DIAGNOSIS — J449 Chronic obstructive pulmonary disease, unspecified: Secondary | ICD-10-CM | POA: Diagnosis not present

## 2020-06-16 ENCOUNTER — Other Ambulatory Visit: Payer: Self-pay | Admitting: Nurse Practitioner

## 2020-06-16 DIAGNOSIS — E032 Hypothyroidism due to medicaments and other exogenous substances: Secondary | ICD-10-CM

## 2020-06-17 DIAGNOSIS — I5032 Chronic diastolic (congestive) heart failure: Secondary | ICD-10-CM | POA: Diagnosis not present

## 2020-06-17 DIAGNOSIS — I4891 Unspecified atrial fibrillation: Secondary | ICD-10-CM | POA: Diagnosis not present

## 2020-06-17 DIAGNOSIS — Z299 Encounter for prophylactic measures, unspecified: Secondary | ICD-10-CM | POA: Diagnosis not present

## 2020-06-17 DIAGNOSIS — K7031 Alcoholic cirrhosis of liver with ascites: Secondary | ICD-10-CM | POA: Diagnosis not present

## 2020-06-17 DIAGNOSIS — J449 Chronic obstructive pulmonary disease, unspecified: Secondary | ICD-10-CM | POA: Diagnosis not present

## 2020-06-18 ENCOUNTER — Other Ambulatory Visit: Payer: Self-pay | Admitting: Cardiology

## 2020-06-22 ENCOUNTER — Ambulatory Visit (INDEPENDENT_AMBULATORY_CARE_PROVIDER_SITE_OTHER): Payer: Medicare Other | Admitting: *Deleted

## 2020-06-22 DIAGNOSIS — I4819 Other persistent atrial fibrillation: Secondary | ICD-10-CM

## 2020-06-22 DIAGNOSIS — Z9889 Other specified postprocedural states: Secondary | ICD-10-CM

## 2020-06-22 DIAGNOSIS — Z5181 Encounter for therapeutic drug level monitoring: Secondary | ICD-10-CM | POA: Diagnosis not present

## 2020-06-22 LAB — POCT INR: INR: 2.8 (ref 2.0–3.0)

## 2020-06-22 NOTE — Patient Instructions (Signed)
Continue warfarin 2 tablets daily except 3 tablets on Wednesdays Recheck in 4 wks Pt not interested in Eliquis at this time due to cost.

## 2020-07-11 DIAGNOSIS — H11823 Conjunctivochalasis, bilateral: Secondary | ICD-10-CM | POA: Diagnosis not present

## 2020-07-11 DIAGNOSIS — H02834 Dermatochalasis of left upper eyelid: Secondary | ICD-10-CM | POA: Diagnosis not present

## 2020-07-11 DIAGNOSIS — H26491 Other secondary cataract, right eye: Secondary | ICD-10-CM | POA: Diagnosis not present

## 2020-07-11 DIAGNOSIS — H02831 Dermatochalasis of right upper eyelid: Secondary | ICD-10-CM | POA: Diagnosis not present

## 2020-07-14 DIAGNOSIS — I5032 Chronic diastolic (congestive) heart failure: Secondary | ICD-10-CM | POA: Diagnosis not present

## 2020-07-19 ENCOUNTER — Ambulatory Visit (INDEPENDENT_AMBULATORY_CARE_PROVIDER_SITE_OTHER): Payer: Medicare Other | Admitting: *Deleted

## 2020-07-19 DIAGNOSIS — Z5181 Encounter for therapeutic drug level monitoring: Secondary | ICD-10-CM | POA: Diagnosis not present

## 2020-07-19 DIAGNOSIS — I4819 Other persistent atrial fibrillation: Secondary | ICD-10-CM

## 2020-07-19 LAB — POCT INR: INR: 2.6 (ref 2.0–3.0)

## 2020-07-19 NOTE — Patient Instructions (Signed)
Continue warfarin 2 tablets daily except 3 tablets on Wednesdays Recheck in 6 wks Pt not interested in Eliquis at this time due to cost.

## 2020-07-22 ENCOUNTER — Telehealth: Payer: Self-pay | Admitting: *Deleted

## 2020-07-22 DIAGNOSIS — Z961 Presence of intraocular lens: Secondary | ICD-10-CM | POA: Diagnosis not present

## 2020-07-22 NOTE — Telephone Encounter (Signed)
Called and advised patient that there is no need to hold warfarin for a tooth filling. She should continue warfarin per usual.

## 2020-07-22 NOTE — Telephone Encounter (Signed)
Patient called said she will be having some dental work(teeth filled) on 08/02/20. She needs to know when she should stop taking the Warfarin. She can be reached at [phone].

## 2020-08-01 ENCOUNTER — Encounter: Payer: Self-pay | Admitting: Internal Medicine

## 2020-08-10 DIAGNOSIS — L814 Other melanin hyperpigmentation: Secondary | ICD-10-CM | POA: Diagnosis not present

## 2020-08-10 DIAGNOSIS — L570 Actinic keratosis: Secondary | ICD-10-CM | POA: Diagnosis not present

## 2020-08-10 DIAGNOSIS — Z1283 Encounter for screening for malignant neoplasm of skin: Secondary | ICD-10-CM | POA: Diagnosis not present

## 2020-08-10 DIAGNOSIS — D239 Other benign neoplasm of skin, unspecified: Secondary | ICD-10-CM | POA: Diagnosis not present

## 2020-08-10 DIAGNOSIS — D485 Neoplasm of uncertain behavior of skin: Secondary | ICD-10-CM | POA: Diagnosis not present

## 2020-08-15 ENCOUNTER — Other Ambulatory Visit: Payer: Self-pay

## 2020-08-15 DIAGNOSIS — I739 Peripheral vascular disease, unspecified: Secondary | ICD-10-CM

## 2020-08-16 DIAGNOSIS — Z23 Encounter for immunization: Secondary | ICD-10-CM | POA: Diagnosis not present

## 2020-08-24 ENCOUNTER — Other Ambulatory Visit: Payer: Self-pay

## 2020-08-24 ENCOUNTER — Ambulatory Visit (HOSPITAL_COMMUNITY)
Admission: RE | Admit: 2020-08-24 | Discharge: 2020-08-24 | Disposition: A | Discharge status: Home / Self Care | Payer: Medicare Other | Source: Ambulatory Visit | Attending: Vascular Surgery | Admitting: Vascular Surgery

## 2020-08-24 ENCOUNTER — Encounter: Payer: Self-pay | Admitting: Vascular Surgery

## 2020-08-24 ENCOUNTER — Ambulatory Visit (INDEPENDENT_AMBULATORY_CARE_PROVIDER_SITE_OTHER): Payer: Medicare Other | Admitting: Vascular Surgery

## 2020-08-24 VITALS — BP 144/76 | HR 69 | Temp 97.7°F | Resp 20 | Ht 61.0 in | Wt 126.0 lb

## 2020-08-24 DIAGNOSIS — I872 Venous insufficiency (chronic) (peripheral): Secondary | ICD-10-CM

## 2020-08-24 DIAGNOSIS — I739 Peripheral vascular disease, unspecified: Secondary | ICD-10-CM | POA: Diagnosis not present

## 2020-08-24 DIAGNOSIS — I8393 Asymptomatic varicose veins of bilateral lower extremities: Secondary | ICD-10-CM

## 2020-08-24 NOTE — Progress Notes (Signed)
REASON FOR CONSULT:    To evaluate for peripheral vascular disease.  The consult is requested by Dr. Manuella Ghazi.   ASSESSMENT & PLAN:   CHRONIC VENOUS INSUFFICIENCY: This patient has CEAP C4 venous disease.  She is had previous laser ablation procedures elsewhere approximately 6 to 7 years ago.  She has some large varicose veins and significant hyperpigmentation but has had no previous ulcerations.  We have discussed the importance of intermittent leg elevation and the proper positioning for this.  I have recommended that she wear a knee-high compression stocking with a gradient of 15 to 20 mmHg if she is going to being on her feet for a prolonged period of time.  I encouraged her to avoid prolonged sitting and standing.  We discussed the importance of exercise specifically walking and water aerobics.  If her symptoms progress or her varicose veins progressed and certainly we could do formal venous reflux testing and consider stab phlebectomies.  However currently her symptoms are fairly stable.  I reassured her that she did not have any evidence of peripheral vascular disease on exam or by her noninvasive study.    Deitra Mayo, MD Office: [phone]   HPI:   [name] [name] is a pleasant 80 y.o. female, who was referred for evaluation of peripheral vascular disease.  On my history, the patient denies any history of claudication, rest pain, or nonhealing ulcers.  She does tell me that she had a DVT in her left leg in the 70s or 80s after having surgery on her bladder.  She has had no further problems that she is aware of.  She also tells me that she had procedures in both legs elsewhere and I suspect this was laser ablation of the saphenous veins.  She does describe some aching pain and heaviness in her legs which is aggravated by standing and relieved with elevation.  He does not wear compression stockings routinely.  She is on Coumadin for atrial fibrillation.  Her risk factors for  peripheral vascular disease include hypercholesterolemia and hypertension.  She denies any history of diabetes, family history of premature cardiovascular disease, or tobacco use.  Past Medical History:  Diagnosis Date  . Anxiety   . Arthritis   . Asthma   . Atrial fibrillation, persistent ([place])   . Chronic diastolic congestive heart failure ([place])   . Colon adenoma   . Colon cancer ([place])    status post low anterior resection, limited stage disease requiring no adjuvant therapy  . COPD (chronic obstructive pulmonary disease) ([place])   . Depression   . Diverticulosis   . DM (dermatomyositis)   . DVT (deep venous thrombosis) ([name])    in leg- long time ago  . Dyspnea    with activity  . Dysrhythmia   . Esophageal dysphagia   . GERD (gastroesophageal reflux disease)   . Headache   . Heart murmur   . Hematuria   . Hemorrhoids   . Hiatal hernia   . History of kidney stones    x 2  . Hypercholesterolemia   . Incidental pulmonary nodule 05/08/2016   8 mm vague opacity RML noted on CT scan  . Mitral regurgitation   . PONV (postoperative nausea and vomiting) 2003 ish    with breast biopsy  . S/P minimally invasive maze operation for atrial fibrillation 07/05/2016   Complete bilateral atrial lesion set using cryothermy and bipolar radiofrequency ablation with clipping of LA appendage via right mini thoracotomy approach  . S/P  minimally invasive mitral valve repair 07/05/2016   Complex valvuloplasty including artificial Gore-tex neochord placement x6 and 30 mm Sorin Memo 3D ring annuloplasty via right mini thoracotomy approach  . Schatzki's ring   . Tricuspid regurgitation     Family History  Problem Relation Age of Onset  . Breast cancer Mother   . Rheum arthritis Father   . Cancer - Lung Sister   . Brain cancer Sister   . Prostate cancer Brother   . Aneurysm Brother   . Colon cancer Neg Hx     SOCIAL HISTORY: Social History   Socioeconomic History  . Marital status: Married     Spouse name: Not on file  . Number of children: 2  . Years of education: Not on file  . Highest education level: Not on file  Occupational History  . Not on file  Tobacco Use  . Smoking status: Never Smoker  . Smokeless tobacco: Never Used  Vaping Use  . Vaping Use: Never used  Substance and Sexual Activity  . Alcohol use: No    Alcohol/week: 0.0 standard drinks  . Drug use: No  . Sexual activity: Not on file  Other Topics Concern  . Not on file  Social History Narrative  . Not on file   Social Determinants of Health   Financial Resource Strain: Not on file  Food Insecurity: Not on file  Transportation Needs: Not on file  Physical Activity: Not on file  Stress: Not on file  Social Connections: Not on file  Intimate Partner Violence: Not on file    Allergies  Allergen Reactions  . Iohexol Hives, Shortness Of Breath and Other (See Comments)     Pt. had a severe allergic reaction to IV contrast the last time she was injected and had to be seen in the ER.   Marland Kitchen Hydrocodone-Acetaminophen Hives  . Nabumetone Rash  . Prednisone Rash    Current Outpatient Medications  Medication Sig Dispense Refill  . acetaminophen (TYLENOL) 500 MG tablet Take 1,000 mg by mouth as needed for moderate pain or headache.    . albuterol (PROVENTIL) (2.5 MG/3ML) 0.083% nebulizer solution Take 2.5 mg by nebulization every 6 (six) hours as needed for wheezing or shortness of breath.    . ALPRAZolam (XANAX) 0.5 MG tablet Take 0.5 mg by mouth at bedtime.    . calcium carbonate (OS-CAL) 600 MG TABS tablet Take 600 mg by mouth 2 (two) times daily with a meal.    . citalopram (CELEXA) 20 MG tablet Take 20 mg by mouth daily.    . Coenzyme Q10 100 MG TABS Take 100 mg by mouth every evening.     . furosemide (LASIX) 20 MG tablet Take 40 mg by mouth 2 (two) times daily.    Marland Kitchen levothyroxine (SYNTHROID) 75 MCG tablet TAKE 1/2 TABLET(37.5 MCG) BY MOUTH DAILY BEFORE BREAKFAST 45 tablet 0  . Multiple Vitamin  (MULTIVITAMIN) tablet Take 1 tablet by mouth daily.    . pantoprazole (PROTONIX) 40 MG tablet Take 1 tablet (40 mg total) by mouth daily. 90 tablet 3  . potassium chloride (KLOR-CON) 10 MEQ tablet Take 10 mEq by mouth every other day.    . simvastatin (ZOCOR) 40 MG tablet Take 40 mg daily by mouth.    . warfarin (COUMADIN) 2.5 MG tablet TAKE 2 TABLETS BY MOUTH EVERY DAY, EXCEPT 3 TABLETS ON WEDNESDAYS OR AS DIRECTED 192 tablet 3   No current facility-administered medications for this visit.    REVIEW  OF SYSTEMS:  [X]  denotes positive finding, [ ]  denotes negative finding Cardiac  Comments:  Chest pain or chest pressure:    Shortness of breath upon exertion: x   Short of breath when lying flat:    Irregular heart rhythm: x       Vascular    Pain in calf, thigh, or hip brought on by ambulation:    Pain in feet at night that wakes you up from your sleep:     Blood clot in your veins:    Leg swelling:         Pulmonary    Oxygen at home:    Productive cough:     Wheezing:  x       Neurologic    Sudden weakness in arms or legs:     Sudden numbness in arms or legs:     Sudden onset of difficulty speaking or slurred speech:    Temporary loss of vision in one eye:     Problems with dizziness:         Gastrointestinal    Blood in stool:     Vomited blood:         Genitourinary    Burning when urinating:     Blood in urine:        Psychiatric    Major depression:         Hematologic    Bleeding problems:    Problems with blood clotting too easily:        Skin    Rashes or ulcers: x       Constitutional    Fever or chills:     PHYSICAL EXAM:   Vitals:   08/24/20 1423  BP: (!) 144/76  Pulse: 69  Resp: 20  Temp: 97.7 F (36.5 C)  SpO2: 95%  Weight: 126 lb (57.2 kg)  Height: 5\' 1"  (1.549 m)   GENERAL: The patient is a well-nourished female, in no acute distress. The vital signs are documented above. CARDIAC: There is a regular rate and rhythm.  VASCULAR: I do  not detect carotid bruits. She has palpable femoral, dorsalis pedis, and posterior tibial pulses bilaterally. She has significant varicose veins bilaterally in the thigh and legs.  She has hyperpigmentation bilaterally more significantly on the left side. I did look at both legs myself with the SonoSite and it appears that the great saphenous veins have been ablated bilaterally.  She has significant varicosities in the thighs bilaterally. PULMONARY: There is good air exchange bilaterally without wheezing or rales. ABDOMEN: Soft and non-tender with normal pitched bowel sounds.  MUSCULOSKELETAL: There are no major deformities or cyanosis. NEUROLOGIC: No focal weakness or paresthesias are detected. SKIN: There are no ulcers or rashes noted. PSYCHIATRIC: The patient has a normal affect.  DATA:    ARTERIAL DOPPLER STUDY: I have independently interpreted her arterial Doppler study today.  On the right side there is a triphasic dorsalis pedis and posterior tibial signal.  ABIs 100%.  Toe pressures 118 mmHg.  On the left side there is a biphasic posterior tibial signal with a triphasic dorsalis pedis signal.  ABI is 100%.  Toe pressure is 112 mmHg.  LABS: Her INR on 07/19/2020 was 2.6.

## 2020-08-30 ENCOUNTER — Ambulatory Visit (INDEPENDENT_AMBULATORY_CARE_PROVIDER_SITE_OTHER): Payer: Medicare Other | Admitting: *Deleted

## 2020-08-30 DIAGNOSIS — I4819 Other persistent atrial fibrillation: Secondary | ICD-10-CM

## 2020-08-30 DIAGNOSIS — Z5181 Encounter for therapeutic drug level monitoring: Secondary | ICD-10-CM | POA: Diagnosis not present

## 2020-08-30 DIAGNOSIS — I5032 Chronic diastolic (congestive) heart failure: Secondary | ICD-10-CM | POA: Diagnosis not present

## 2020-08-30 DIAGNOSIS — I4891 Unspecified atrial fibrillation: Secondary | ICD-10-CM | POA: Diagnosis not present

## 2020-08-30 DIAGNOSIS — J441 Chronic obstructive pulmonary disease with (acute) exacerbation: Secondary | ICD-10-CM | POA: Diagnosis not present

## 2020-08-30 DIAGNOSIS — Z299 Encounter for prophylactic measures, unspecified: Secondary | ICD-10-CM | POA: Diagnosis not present

## 2020-08-30 DIAGNOSIS — Z6825 Body mass index (BMI) 25.0-25.9, adult: Secondary | ICD-10-CM | POA: Diagnosis not present

## 2020-08-30 DIAGNOSIS — J449 Chronic obstructive pulmonary disease, unspecified: Secondary | ICD-10-CM | POA: Diagnosis not present

## 2020-08-30 LAB — POCT INR: INR: 1.6 — AB (ref 2.0–3.0)

## 2020-08-30 NOTE — Patient Instructions (Signed)
Take warfarin 4 tablets tonight then resume 2 tablets daily except 3 tablets on Wednesdays Recheck in 3 wks Pt not interested in Eliquis at this time due to cost.

## 2020-09-02 ENCOUNTER — Telehealth: Payer: Self-pay | Admitting: Nurse Practitioner

## 2020-09-02 DIAGNOSIS — E032 Hypothyroidism due to medicaments and other exogenous substances: Secondary | ICD-10-CM

## 2020-09-02 MED ORDER — LEVOTHYROXINE SODIUM 75 MCG PO TABS: ORAL_TABLET | ORAL | 0 refills | Status: DC

## 2020-09-02 NOTE — Telephone Encounter (Signed)
Pt is calling and requesting a refill on her medication.  levothyroxine (SYNTHROID) 75 MCG tablet   [facility] [phone] - [place], [place] [address] Phone:  [phone]  Fax:  [phone]

## 2020-09-02 NOTE — Telephone Encounter (Signed)
Rx sent 

## 2020-09-08 DIAGNOSIS — Z299 Encounter for prophylactic measures, unspecified: Secondary | ICD-10-CM | POA: Diagnosis not present

## 2020-09-08 DIAGNOSIS — Z789 Other specified health status: Secondary | ICD-10-CM | POA: Diagnosis not present

## 2020-09-08 DIAGNOSIS — I517 Cardiomegaly: Secondary | ICD-10-CM | POA: Diagnosis not present

## 2020-09-08 DIAGNOSIS — S299XXA Unspecified injury of thorax, initial encounter: Secondary | ICD-10-CM | POA: Diagnosis not present

## 2020-09-08 DIAGNOSIS — M419 Scoliosis, unspecified: Secondary | ICD-10-CM | POA: Diagnosis not present

## 2020-09-08 DIAGNOSIS — F411 Generalized anxiety disorder: Secondary | ICD-10-CM | POA: Diagnosis not present

## 2020-09-08 DIAGNOSIS — K449 Diaphragmatic hernia without obstruction or gangrene: Secondary | ICD-10-CM | POA: Diagnosis not present

## 2020-09-08 DIAGNOSIS — R0781 Pleurodynia: Secondary | ICD-10-CM | POA: Diagnosis not present

## 2020-09-08 DIAGNOSIS — I4891 Unspecified atrial fibrillation: Secondary | ICD-10-CM | POA: Diagnosis not present

## 2020-09-13 ENCOUNTER — Other Ambulatory Visit (HOSPITAL_COMMUNITY)
Admission: RE | Admit: 2020-09-13 | Discharge: 2020-09-13 | Disposition: A | Discharge status: Home / Self Care | Payer: Medicare Other | Source: Ambulatory Visit | Attending: Nurse Practitioner | Admitting: Nurse Practitioner

## 2020-09-13 ENCOUNTER — Other Ambulatory Visit: Payer: Self-pay

## 2020-09-13 DIAGNOSIS — J449 Chronic obstructive pulmonary disease, unspecified: Secondary | ICD-10-CM | POA: Diagnosis not present

## 2020-09-13 DIAGNOSIS — E032 Hypothyroidism due to medicaments and other exogenous substances: Secondary | ICD-10-CM | POA: Diagnosis not present

## 2020-09-13 DIAGNOSIS — Z789 Other specified health status: Secondary | ICD-10-CM | POA: Diagnosis not present

## 2020-09-13 DIAGNOSIS — R0781 Pleurodynia: Secondary | ICD-10-CM | POA: Diagnosis not present

## 2020-09-13 DIAGNOSIS — Z23 Encounter for immunization: Secondary | ICD-10-CM | POA: Diagnosis not present

## 2020-09-13 DIAGNOSIS — Z299 Encounter for prophylactic measures, unspecified: Secondary | ICD-10-CM | POA: Diagnosis not present

## 2020-09-13 DIAGNOSIS — I5032 Chronic diastolic (congestive) heart failure: Secondary | ICD-10-CM | POA: Diagnosis not present

## 2020-09-13 DIAGNOSIS — I4891 Unspecified atrial fibrillation: Secondary | ICD-10-CM | POA: Diagnosis not present

## 2020-09-13 LAB — T4, FREE: Free T4: 0.95 ng/dL (ref 0.61–1.12)

## 2020-09-13 LAB — TSH: TSH: 1.105 u[IU]/mL (ref 0.350–4.500)

## 2020-09-19 DIAGNOSIS — I5032 Chronic diastolic (congestive) heart failure: Secondary | ICD-10-CM | POA: Diagnosis not present

## 2020-09-19 DIAGNOSIS — T63481A Toxic effect of venom of other arthropod, accidental (unintentional), initial encounter: Secondary | ICD-10-CM | POA: Diagnosis not present

## 2020-09-19 DIAGNOSIS — F419 Anxiety disorder, unspecified: Secondary | ICD-10-CM | POA: Diagnosis not present

## 2020-09-19 DIAGNOSIS — Z79899 Other long term (current) drug therapy: Secondary | ICD-10-CM | POA: Diagnosis not present

## 2020-09-19 DIAGNOSIS — Z886 Allergy status to analgesic agent status: Secondary | ICD-10-CM | POA: Diagnosis not present

## 2020-09-19 DIAGNOSIS — J449 Chronic obstructive pulmonary disease, unspecified: Secondary | ICD-10-CM | POA: Diagnosis not present

## 2020-09-19 DIAGNOSIS — I4891 Unspecified atrial fibrillation: Secondary | ICD-10-CM | POA: Diagnosis not present

## 2020-09-19 DIAGNOSIS — K148 Other diseases of tongue: Secondary | ICD-10-CM | POA: Diagnosis not present

## 2020-09-19 DIAGNOSIS — I110 Hypertensive heart disease with heart failure: Secondary | ICD-10-CM | POA: Diagnosis not present

## 2020-09-19 DIAGNOSIS — Z91041 Radiographic dye allergy status: Secondary | ICD-10-CM | POA: Diagnosis not present

## 2020-09-19 DIAGNOSIS — Z85038 Personal history of other malignant neoplasm of large intestine: Secondary | ICD-10-CM | POA: Diagnosis not present

## 2020-09-19 DIAGNOSIS — Z86718 Personal history of other venous thrombosis and embolism: Secondary | ICD-10-CM | POA: Diagnosis not present

## 2020-09-19 DIAGNOSIS — R220 Localized swelling, mass and lump, head: Secondary | ICD-10-CM | POA: Diagnosis not present

## 2020-09-20 ENCOUNTER — Ambulatory Visit (INDEPENDENT_AMBULATORY_CARE_PROVIDER_SITE_OTHER): Payer: Medicare Other | Admitting: Nurse Practitioner

## 2020-09-20 ENCOUNTER — Other Ambulatory Visit: Payer: Self-pay

## 2020-09-20 ENCOUNTER — Ambulatory Visit: Payer: Medicare Other | Admitting: Nurse Practitioner

## 2020-09-20 ENCOUNTER — Encounter: Payer: Self-pay | Admitting: Nurse Practitioner

## 2020-09-20 VITALS — BP 125/72 | HR 81 | Ht 61.0 in | Wt 127.0 lb

## 2020-09-20 DIAGNOSIS — E032 Hypothyroidism due to medicaments and other exogenous substances: Secondary | ICD-10-CM | POA: Diagnosis not present

## 2020-09-20 NOTE — Progress Notes (Signed)
09/20/2020, 11:23 AM                 Endocrinology follow-up   Subjective:    Patient ID: [name] [name], female    DOB: [birth year], PCP Glenda Chroman, MD   Past Medical History:  Diagnosis Date  . Anxiety   . Arthritis   . Asthma   . Atrial fibrillation, persistent ([place])   . Chronic diastolic congestive heart failure ([place])   . Colon adenoma   . Colon cancer ([place])    status post low anterior resection, limited stage disease requiring no adjuvant therapy  . COPD (chronic obstructive pulmonary disease) ([place])   . Depression   . Diverticulosis   . DM (dermatomyositis)   . DVT (deep venous thrombosis) ([name])    in leg- long time ago  . Dyspnea    with activity  . Dysrhythmia   . Esophageal dysphagia   . GERD (gastroesophageal reflux disease)   . Headache   . Heart murmur   . Hematuria   . Hemorrhoids   . Hiatal hernia   . History of kidney stones    x 2  . Hypercholesterolemia   . Incidental pulmonary nodule 05/08/2016   8 mm vague opacity RML noted on CT scan  . Mitral regurgitation   . PONV (postoperative nausea and vomiting) 2003 ish    with breast biopsy  . S/P minimally invasive maze operation for atrial fibrillation 07/05/2016   Complete bilateral atrial lesion set using cryothermy and bipolar radiofrequency ablation with clipping of LA appendage via right mini thoracotomy approach  . 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 Schatzki's ring   . Tricuspid regurgitation    Past Surgical History:  Procedure Laterality Date  . APPENDECTOMY    . BREAST SURGERY Right 2003ish   biopsy  . CARDIAC CATHETERIZATION N/A 04/12/2016   Procedure: Right/Left Heart Cath and Coronary Angiography;  Surgeon: Leonie Man, MD;  Location: [facility];   Service: Cardiovascular;  Laterality: N/A;  . CARDIOVERSION N/A 08/16/2017   Procedure: CARDIOVERSION;  Surgeon: Arnoldo Lenis, MD;  Location: [name];  Service: Endoscopy;  Laterality: N/A;  . CATARACT EXTRACTION Bilateral 2017  . CLIPPING OF ATRIAL APPENDAGE  07/05/2016   Procedure: CLIPPING OF ATRIAL APPENDAGE;  Surgeon: Rexene Alberts, MD;  Location: [facility];  Service: Open Heart Surgery;;  . COLONOSCOPY  11/09   Dr. Gala Romney- external hemorrhoidal tags o/w normal rectal mucosa, s/p surgical resection with a normal appearing anastomosis 12cm, pan colonic diverticulum  . COLONOSCOPY N/A 06/02/2012   VZS:MOLMBE post low anterior resection. Pancolonic diverticulosis. Colonic polyp-tubular adenoma. Surveillance due 2019.   Marland Kitchen COLONOSCOPY N/A 10/13/2015   Procedure: COLONOSCOPY;  Surgeon: Daneil Dolin, MD;  [name];  Service: Endoscopy;  Laterality: N/A;  0930  . COLONOSCOPY WITH PROPOFOL N/A 11/20/2018   Procedure: COLONOSCOPY WITH PROPOFOL;  Surgeon: Daneil Dolin, MD;  [name];  Service: Endoscopy;  Laterality: N/A;  2:30pm  . ESOPHAGOGASTRODUODENOSCOPY  07/2007   Dr. Daiva Nakayama cervical esophageal web, schatzki  ring, large hiatal hernia  . ESOPHAGOGASTRODUODENOSCOPY (EGD) WITH PROPOFOL N/A 11/20/2018   Procedure: ESOPHAGOGASTRODUODENOSCOPY (EGD) WITH PROPOFOL;  Surgeon: Daneil Dolin, MD;  [name];  Service: Endoscopy;  Laterality: N/A;  . INGUNAL HERNIA REPAIR Left   . LEG SKIN LESION  BIOPSY / EXCISION    . LOW ANTERIOR BOWEL RESECTION     NO ADJ CHEMO  . MINIMALLY INVASIVE MAZE PROCEDURE N/A 07/05/2016   Procedure: MINIMALLY INVASIVE MAZE PROCEDURE;  Surgeon: Rexene Alberts, MD;  Location: [facility];  Service: Open Heart Surgery;  Laterality: N/A;  . MITRAL VALVE REPAIR Right 07/05/2016   Procedure: MINIMALLY INVASIVE MITRAL VALVE REPAIR (MVR);  Surgeon: Rexene Alberts, MD;  Location: [facility];  Service: Open Heart Surgery;  Laterality: Right;   . MULTIPLE EXTRACTIONS WITH ALVEOLOPLASTY N/A 05/21/2016   Procedure: MULTIPLE EXTRACTION OF TOOTH #'S 5, 21 WITH ALVEOLOPLASTY AND GROSS DEBRIDEMENT OF TEETH;  Surgeon: Lenn Cal, DDS;  Location: [facility];  Service: Oral Surgery;  Laterality: N/A;  . POLYPECTOMY  11/20/2018   Procedure: POLYPECTOMY;  Surgeon: Daneil Dolin, MD;  [name];  Service: Endoscopy;;  colon  . TEE WITHOUT CARDIOVERSION N/A 02/20/2016   Procedure: TRANSESOPHAGEAL ECHOCARDIOGRAM (TEE);  Surgeon: Arnoldo Lenis, MD;  [name];  Service: Endoscopy;  Laterality: N/A;  . TEE WITHOUT CARDIOVERSION N/A 07/05/2016   Procedure: TRANSESOPHAGEAL ECHOCARDIOGRAM (TEE);  Surgeon: Rexene Alberts, MD;  Location: [facility];  Service: Open Heart Surgery;  Laterality: N/A;  . TOOTH EXTRACTION    . TUBAL LIGATION    . VEIN LIGATION AND STRIPPING     Social History   Socioeconomic History  . Marital status: Married    Spouse name: Not on file  . Number of children: 2  . Years of education: Not on file  . Highest education level: Not on file  Occupational History  . Not on file  Tobacco Use  . Smoking status: Never Smoker  . Smokeless tobacco: Never Used  Vaping Use  . Vaping Use: Never used  Substance and Sexual Activity  . Alcohol use: No    Alcohol/week: 0.0 standard drinks  . Drug use: No  . Sexual activity: Not on file  Other Topics Concern  . Not on file  Social History Narrative  . Not on file   Social Determinants of Health   Financial Resource Strain: Not on file  Food Insecurity: Not on file  Transportation Needs: Not on file  Physical Activity: Not on file  Stress: Not on file  Social Connections: Not on file   Outpatient Encounter Medications as of 09/20/2020  Medication Sig  . acetaminophen (TYLENOL) 500 MG tablet Take 1,000 mg by mouth as needed for moderate pain or headache.  . albuterol (PROVENTIL) (2.5 MG/3ML) 0.083% nebulizer solution Take 2.5 mg by  nebulization every 6 (six) hours as needed for wheezing or shortness of breath.  . ALPRAZolam (XANAX) 0.5 MG tablet Take 0.5 mg by mouth at bedtime.  . calcium carbonate (OS-CAL) 600 MG TABS tablet Take 600 mg by mouth 2 (two) times daily with a meal.  . citalopram (CELEXA) 20 MG tablet Take 20 mg by mouth daily.  . Coenzyme Q10 100 MG TABS Take 100 mg by mouth every evening.   . furosemide (LASIX) 20 MG tablet Take 40 mg by mouth 2 (two) times daily.  Marland Kitchen levothyroxine (SYNTHROID) 75 MCG tablet TAKE 1/2 TABLET(37.5 MCG) BY MOUTH DAILY BEFORE BREAKFAST  .  Multiple Vitamin (MULTIVITAMIN) tablet Take 1 tablet by mouth daily.  . pantoprazole (PROTONIX) 40 MG tablet Take 1 tablet (40 mg total) by mouth daily.  . potassium chloride (KLOR-CON) 10 MEQ tablet Take 10 mEq by mouth every other day.  . simvastatin (ZOCOR) 40 MG tablet Take 40 mg daily by mouth.  . warfarin (COUMADIN) 2.5 MG tablet TAKE 2 TABLETS BY MOUTH EVERY DAY, EXCEPT 3 TABLETS ON WEDNESDAYS OR AS DIRECTED   No facility-administered encounter medications on file as of 09/20/2020.   ALLERGIES: Allergies  Allergen Reactions  . Iohexol Hives, Shortness Of Breath and Other (See Comments)     Pt. had a severe allergic reaction to IV contrast the last time she was injected and had to be seen in the ER.   Marland Kitchen Hydrocodone-Acetaminophen Hives  . Nabumetone Rash  . Prednisone Rash    VACCINATION STATUS: Immunization History  Administered Date(s) Administered  . Influenza Split 03/03/2015  . Influenza, High Dose Seasonal PF 12/13/2016  . Influenza,inj,Quad PF,6+ Mos 01/29/2019  . 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 ) visit. Patient reports no anxiety, cold intolerance, constipation, depressed mood, diarrhea,  fatigue, hair loss, heat intolerance, leg swelling, palpitations, tremors, weight gain or weight loss. The symptoms have been stable.   [name] [name] is 80 y.o. female who is being seen in follow-up hypothyroidism.   -She denies weight loss, tremors, or heat and cold intolerance.    -She is on multiple medications including anticoagulation with warfarin related to her cardiac surgery for tricuspid regurgitation as well as atrial fibrillation. -Her current medication list does not include active amiodarone.   Review of systems  Constitutional: + Minimally fluctuating body weight,  current Body mass index is 24 kg/m. , no fatigue, no subjective hyperthermia, no subjective hypothermia Eyes: no blurry vision, no xerophthalmia ENT: no sore throat, no nodules palpated in throat, no dysphagia/odynophagia, no hoarseness Cardiovascular: no chest pain, no shortness of breath, intermittent palpitations (hx afib), no leg swelling Respiratory: no cough, no shortness of breath Gastrointestinal: no nausea/vomiting/diarrhea Musculoskeletal: no muscle/joint aches Skin: no rashes, no hyperemia, has several tick bites recently (on antibiotics) Neurological: no tremors, no numbness, no tingling, no dizziness Psychiatric: no depression, no anxiety    Objective:    BP 125/72   Pulse 81   Ht 5\' 1"  (1.549 m)   Wt 127 lb (57.6 kg)   BMI 24.00 kg/m   Wt Readings from Last 3 Encounters:  09/20/20 127 lb (57.6 kg)  08/24/20 126 lb (57.2 kg)  04/13/20 126 lb 12.8 oz (57.5 kg)   BP Readings from Last 3 Encounters:  09/20/20 125/72  08/24/20 (!) 144/76  04/13/20 (!) 152/90     Physical Exam- Limited  Constitutional:  Body mass index is 24 kg/m. , not in acute distress, normal state of mind Eyes:  EOMI, no exophthalmos Neck: Supple Cardiovascular: + Irregular HR (hx of afib), no murmurs, rubs, or gallops  Respiratory: Adequate breathing efforts, no crackles, rales, ronchi Musculoskeletal:  no gross deformities, strength intact in all four extremities, no gross restriction of joint movements Skin:  no rashes, no hyperemia Neurological: no tremor with outstretched hands,    CMP     Component Value Date/Time   NA 144 08/12/2017 1409   K 3.8 08/12/2017 1409   CL 100 (L) 08/12/2017  1409   CO2 27 08/12/2017 1409   GLUCOSE 96 08/12/2017 1409   BUN 15 08/12/2017 1409   CREATININE 1.01 (H) 08/12/2017 1409   CALCIUM 9.2 08/12/2017 1409   PROT 6.7 07/03/2016 1157   ALBUMIN 3.8 07/03/2016 1157   AST 23 07/03/2016 1157   ALT 19 07/03/2016 1157   ALKPHOS 113 07/03/2016 1157   BILITOT 0.5 07/03/2016 1157   GFRNONAA 52 (L) 08/12/2017 1409   GFRAA >60 08/12/2017 1409    Diabetic Labs (most recent): Lab Results  Component Value Date   HGBA1C 5.6 07/03/2016    Lab Results  Component Value Date   TSH 1.105 09/13/2020   TSH 1.065 03/14/2020   TSH 1.141 11/13/2019   TSH 2.202 05/13/2019   TSH 2.146 01/07/2019   TSH 0.084 (L) 09/29/2018   TSH 0.311 (L) 06/30/2018   TSH 2.700 03/13/2017   TSH 3.327 07/27/2016   FREET4 0.95 09/13/2020   FREET4 0.99 03/14/2020   FREET4 1.13 (H) 11/13/2019   FREET4 0.95 05/13/2019   FREET4 1.09 01/07/2019   FREET4 1.27 09/29/2018   FREET4 1.23 06/30/2018    Recent Results (from the past 2160 hour(s))  POCT INR     Status: Normal   Collection Time: 06/22/20 11:38 AM  Result Value Ref Range   INR 2.8 2.0 - 3.0  POCT INR     Status: Normal   Collection Time: 07/19/20  9:50 AM  Result Value Ref Range   INR 2.6 2.0 - 3.0  POCT INR     Status: Abnormal   Collection Time: 08/30/20  3:13 PM  Result Value Ref Range   INR 1.6 (A) 2.0 - 3.0  TSH     Status: None   Collection Time: 09/13/20  2:00 PM  Result Value Ref Range   TSH 1.105 0.350 - 4.500 uIU/mL    Comment: Performed by a 3rd Generation assay with a functional sensitivity of <=0.01 uIU/mL. Performed at [facility], [address] [place], [place] [address]   T4, free      Status: None   Collection Time: 09/13/20  2:01 PM  Result Value Ref Range   Free T4 0.95 0.61 - 1.12 ng/dL    Comment: (NOTE) Biotin ingestion may interfere with free T4 tests. If the results are inconsistent with the TSH level, previous test results, or the clinical presentation, then consider biotin interference. If needed, order repeat testing after stopping biotin. Performed at [facility], [place] [address] [place], [place] [address]      Assessment & Plan:   1.  Hypothyroidism likely related to amiodarone treatment Her previsit thyroid function tests are consistent with appropriate hormone replacement.  She is advised to continue Levothyroxine 37.5 mcg po daily before breakfast.    - We discussed about the correct intake of her thyroid hormone, on empty stomach at fasting, with water, separated by at least 30 minutes from breakfast and other medications,  and separated by more than 4 hours from calcium, iron, multivitamins, acid reflux medications (PPIs). -Patient is made aware of the fact that thyroid hormone replacement is needed for life, dose to be adjusted by periodic monitoring of thyroid function tests.   - I advised her  to maintain close follow up with Glenda Chroman, MD for primary care needs.  I encouraged her to resume her follow-up with her cardiologist regarding her history of atrial fibrillation which required treatment with amiodarone.      I spent 20 minutes  in the care of the patient today including review of labs from Thyroid Function, CMP, and other relevant labs ; imaging/biopsy records (current and previous including abstractions from other facilities); face-to-face time discussing  her lab results and symptoms, medications doses, her options of short and long term treatment based on the latest standards of care / guidelines;   and documenting the encounter.  Katharin Schneider Sapp  participated in the discussions, expressed understanding, and voiced  agreement with the above plans.  All questions were answered to her satisfaction. she is encouraged to contact clinic should she have any questions or concerns prior to her return visit.  Follow up plan: Return in about 1 year (around 09/20/2021) for Thyroid follow up, Previsit labs.   Rayetta Pigg, [facility] [address] [place], [place] [address] Phone: [phone] Fax: [phone]    09/20/2020, 11:23 AM

## 2020-09-20 NOTE — Patient Instructions (Signed)

## 2020-09-21 ENCOUNTER — Encounter: Payer: Self-pay | Admitting: Cardiology

## 2020-09-21 ENCOUNTER — Ambulatory Visit (INDEPENDENT_AMBULATORY_CARE_PROVIDER_SITE_OTHER): Payer: Medicare Other | Admitting: *Deleted

## 2020-09-21 DIAGNOSIS — Z299 Encounter for prophylactic measures, unspecified: Secondary | ICD-10-CM | POA: Diagnosis not present

## 2020-09-21 DIAGNOSIS — R220 Localized swelling, mass and lump, head: Secondary | ICD-10-CM | POA: Diagnosis not present

## 2020-09-21 DIAGNOSIS — I4819 Other persistent atrial fibrillation: Secondary | ICD-10-CM | POA: Diagnosis not present

## 2020-09-21 DIAGNOSIS — Z5181 Encounter for therapeutic drug level monitoring: Secondary | ICD-10-CM | POA: Diagnosis not present

## 2020-09-21 DIAGNOSIS — I4891 Unspecified atrial fibrillation: Secondary | ICD-10-CM | POA: Diagnosis not present

## 2020-09-21 DIAGNOSIS — Z6825 Body mass index (BMI) 25.0-25.9, adult: Secondary | ICD-10-CM | POA: Diagnosis not present

## 2020-09-21 DIAGNOSIS — W57XXXA Bitten or stung by nonvenomous insect and other nonvenomous arthropods, initial encounter: Secondary | ICD-10-CM | POA: Diagnosis not present

## 2020-09-21 DIAGNOSIS — S30860A Insect bite (nonvenomous) of lower back and pelvis, initial encounter: Secondary | ICD-10-CM | POA: Diagnosis not present

## 2020-09-21 LAB — POCT INR: INR: 2.8 (ref 2.0–3.0)

## 2020-09-21 NOTE — Patient Instructions (Signed)
Started Doxycycline and prednisone taper yesterday. Decrease warfarin to 2 tablets daily except 1 tablet on Mondays,Wednesdays and Fridays Recheck in 1 wk Pt not interested in Eliquis at this time due to cost.

## 2020-09-28 ENCOUNTER — Ambulatory Visit (INDEPENDENT_AMBULATORY_CARE_PROVIDER_SITE_OTHER): Payer: Medicare Other | Admitting: *Deleted

## 2020-09-28 DIAGNOSIS — Z5181 Encounter for therapeutic drug level monitoring: Secondary | ICD-10-CM | POA: Diagnosis not present

## 2020-09-28 DIAGNOSIS — I4819 Other persistent atrial fibrillation: Secondary | ICD-10-CM

## 2020-09-28 LAB — POCT INR: INR: 2.4 (ref 2.0–3.0)

## 2020-09-28 NOTE — Patient Instructions (Signed)
Has 2 days doxycycline left.  Finished prednisone 2 days ago. Increase warfarin to 2 tablets daily Recheck in 2 wk Pt not interested in Eliquis at this time due to cost.

## 2020-10-04 ENCOUNTER — Encounter: Payer: Self-pay | Admitting: Gastroenterology

## 2020-10-04 ENCOUNTER — Other Ambulatory Visit: Payer: Self-pay

## 2020-10-04 ENCOUNTER — Ambulatory Visit (INDEPENDENT_AMBULATORY_CARE_PROVIDER_SITE_OTHER): Payer: Medicare Other | Admitting: Gastroenterology

## 2020-10-04 ENCOUNTER — Ambulatory Visit: Payer: Medicare Other | Admitting: Nurse Practitioner

## 2020-10-04 VITALS — BP 121/76 | HR 71 | Temp 96.5°F | Ht 61.0 in | Wt 128.0 lb

## 2020-10-04 DIAGNOSIS — C189 Malignant neoplasm of colon, unspecified: Secondary | ICD-10-CM | POA: Insufficient documentation

## 2020-10-04 DIAGNOSIS — K5900 Constipation, unspecified: Secondary | ICD-10-CM | POA: Diagnosis not present

## 2020-10-04 DIAGNOSIS — K219 Gastro-esophageal reflux disease without esophagitis: Secondary | ICD-10-CM

## 2020-10-04 NOTE — Patient Instructions (Addendum)
Try taking protonix in the morning, 30 mins prior to breakfast. Can try miralax every other day or benefiber daily to help with occasional constipation. Continue to eat fruits and veggies, especially kiwi regularly as well as drinking plenty of water.  Try to limit toilet time to less than 5 minutes to avoid unnecessary straining.    Follow up in 1 year, or sooner if needed.   It was a pleasure providing care to you today!

## 2020-10-04 NOTE — Progress Notes (Signed)
Referring Provider: Glenda Chroman, MD Primary Care Physician:  Glenda Chroman, MD Primary GI Physician: Dr. Gala Romney   Chief Complaint  Patient presents with   Gastroesophageal Reflux    Doing ok. Swallowing ok   Constipation    occ     HPI:   [name] [name] is an 80 y.o. female presenting today for follow up of  dysphagia, GERD and ongoing constipation. Patient with History of tubular adenoma and pan-colonic diverticulum in 2014. Lower anterior bowel resection 1999 r/t colon cancer that did not require adjunct chemotherapy. Known large hiatal hernia.  Dysphagia was previously felt to be related to missing teeth. Endoscopy 10/2018 revealed large hiatal hernia. States she is seeing dentist for issues with her teeth but denies any issues with dysphagia at this time.  GERD Currently on protonix, doing well on this with only occasional episodes of reflux symptoms which she takes tums for. Otherwise doing well. States she is taking protonix before dinner currently.  Constipation Ongoing, patient previously stated she preferred to treat with OTC interventions when needed. States she is having BMs mostly daily with occasional episodes of 2-3 days without moving her bowel. States she take miralax once she feels constipated and symptoms typically resolve.   Denies diarrhea, bloody or black stools, nausea, vomiting, early satiety. Does endorse some abdominal pain in area of known hernia as well as "grumbling" in her belly that has been ongoing, no pain associated with this. No alarm symptoms present.   Last colonoscopy: (11/20/18) Findings include one 5 mm polyp at the anastomosis, Diverticulosis in the entire examined colon. Last endoscopy: (11/20/18) findings include large hiatal hernia and tortuous esophagus Recommendations: No repeat of EGD/Colonoscopy r/t patient's advanced age unless new symptoms emerge.   Past Medical History:  Diagnosis Date   Anxiety    Arthritis    Asthma     Atrial fibrillation, persistent ([name])    Chronic diastolic congestive heart failure ([name])    Colon adenoma    Colon cancer ([place]) 1999   status post low anterior resection, limited stage disease requiring no adjuvant therapy   COPD (chronic obstructive pulmonary disease) ([name])    Depression    Diverticulosis    DM (dermatomyositis)    DVT (deep venous thrombosis) ([name])    in leg- long time ago   Dyspnea    with activity   Dysrhythmia    Esophageal dysphagia    GERD (gastroesophageal reflux disease)    Headache    Heart murmur    Hematuria    Hemorrhoids    Hiatal hernia    History of kidney stones    x 2   Hypercholesterolemia    Incidental pulmonary nodule 05/08/2016   8 mm vague opacity RML noted on CT scan   Mitral regurgitation    PONV (postoperative nausea and vomiting) 2003 ish    with breast biopsy   S/P minimally invasive maze operation for atrial fibrillation 07/05/2016   Complete bilateral atrial lesion set using cryothermy and bipolar radiofrequency ablation with clipping of LA appendage via right mini thoracotomy approach   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/05/2016   Complex valvuloplasty including artificial Gore-tex neochord placement x6 and 30 mm Sorin Memo 3D ring annuloplasty via right mini thoracotomy approach   Schatzki's ring    Tricuspid regurgitation     Past Surgical History:  Procedure Laterality Date   APPENDECTOMY     BREAST SURGERY Right 2003ish   biopsy  CARDIAC CATHETERIZATION N/A 04/12/2016   Procedure: Right/Left Heart Cath and Coronary Angiography;  Surgeon: Leonie Man, MD;  Location: [facility];  Service: Cardiovascular;  Laterality: N/A;   CARDIOVERSION N/A 08/16/2017   Procedure: CARDIOVERSION;  Surgeon: Arnoldo Lenis, MD;  [facility]: [name];  Service: Endoscopy;  Laterality: N/A;   CATARACT EXTRACTION Bilateral 2017   CLIPPING OF ATRIAL APPENDAGE  07/05/2016   Procedure: CLIPPING OF ATRIAL APPENDAGE;   Surgeon: Rexene Alberts, MD;  Location: [place];  Service: Open Heart Surgery;;   COLONOSCOPY  02/2008   Dr. Gala Romney- external hemorrhoidal tags o/w normal rectal mucosa, s/p surgical resection with a normal appearing anastomosis 12cm, pan colonic diverticulum   COLONOSCOPY N/A 06/02/2012   [id number] post low anterior resection. Pancolonic diverticulosis. Colonic polyp-tubular adenoma. Surveillance due 2019.    COLONOSCOPY N/A 10/13/2015   Procedure: COLONOSCOPY;  Surgeon: Daneil Dolin, MD;  [name];  Service: Endoscopy;  Laterality: N/A;  0930   COLONOSCOPY WITH PROPOFOL N/A 11/20/2018   Procedure: COLONOSCOPY WITH PROPOFOL;  Surgeon: Daneil Dolin, MD;  [name];  Service: Endoscopy;  Laterality: N/A;  2:30pm   ESOPHAGOGASTRODUODENOSCOPY  07/2007   Dr. Daiva Nakayama cervical esophageal web, schatzki ring, large hiatal hernia   ESOPHAGOGASTRODUODENOSCOPY (EGD) WITH PROPOFOL N/A 11/20/2018   Procedure: ESOPHAGOGASTRODUODENOSCOPY (EGD) WITH PROPOFOL;  Surgeon: Daneil Dolin, MD;  [name];  Service: Endoscopy;  Laterality: N/A;   INGUNAL HERNIA REPAIR Left    LEG SKIN LESION  BIOPSY / EXCISION     LOW ANTERIOR BOWEL RESECTION  1999   NO ADJ CHEMO   MINIMALLY INVASIVE MAZE PROCEDURE N/A 07/05/2016   Procedure: MINIMALLY INVASIVE MAZE PROCEDURE;  Surgeon: Rexene Alberts, MD;  Location: [place];  Service: Open Heart Surgery;  Laterality: N/A;   MITRAL VALVE REPAIR Right 07/05/2016   Procedure: MINIMALLY INVASIVE MITRAL VALVE REPAIR (MVR);  Surgeon: Rexene Alberts, MD;  Location: [place];  Service: Open Heart Surgery;  Laterality: Right;   MULTIPLE EXTRACTIONS WITH ALVEOLOPLASTY N/A 05/21/2016   Procedure: MULTIPLE EXTRACTION OF TOOTH #'S 5, 21 WITH ALVEOLOPLASTY AND GROSS DEBRIDEMENT OF TEETH;  Surgeon: Lenn Cal, DDS;  Location: [place];  Service: Oral Surgery;  Laterality: N/A;   POLYPECTOMY  11/20/2018   Procedure: POLYPECTOMY;  Surgeon:  Daneil Dolin, MD;  [name];  Service: Endoscopy;;  colon   TEE WITHOUT CARDIOVERSION N/A 02/20/2016   Procedure: TRANSESOPHAGEAL ECHOCARDIOGRAM (TEE);  Surgeon: Arnoldo Lenis, MD;  [name];  Service: Endoscopy;  Laterality: N/A;   TEE WITHOUT CARDIOVERSION N/A 07/05/2016   Procedure: TRANSESOPHAGEAL ECHOCARDIOGRAM (TEE);  Surgeon: Rexene Alberts, MD;  Location: [facility];  Service: Open Heart Surgery;  Laterality: N/A;   TOOTH EXTRACTION     TUBAL LIGATION     VEIN LIGATION AND STRIPPING      Current Outpatient Medications  Medication Sig Dispense Refill   acetaminophen (TYLENOL) 500 MG tablet Take 1,000 mg by mouth as needed for moderate pain or headache.     albuterol (PROVENTIL) (2.5 MG/3ML) 0.083% nebulizer solution Take 2.5 mg by nebulization every 6 (six) hours as needed for wheezing or shortness of breath.     ALPRAZolam (XANAX) 0.5 MG tablet Take 0.5 mg by mouth at bedtime.     calcium carbonate (OS-CAL) 600 MG TABS tablet Take 600 mg by mouth 2 (two) times daily with a meal.     citalopram (  CELEXA) 20 MG tablet Take 20 mg by mouth daily.     Coenzyme Q10 100 MG TABS Take 100 mg by mouth every evening.      furosemide (LASIX) 20 MG tablet Take 40 mg by mouth 2 (two) times daily.     levothyroxine (SYNTHROID) 75 MCG tablet TAKE 1/2 TABLET(37.5 MCG) BY MOUTH DAILY BEFORE BREAKFAST 45 tablet 0   Multiple Vitamin (MULTIVITAMIN) tablet Take 1 tablet by mouth daily.     pantoprazole (PROTONIX) 40 MG tablet Take 1 tablet (40 mg total) by mouth daily. 90 tablet 3   potassium chloride (KLOR-CON) 10 MEQ tablet Take 10 mEq by mouth every other day.     simvastatin (ZOCOR) 40 MG tablet Take 40 mg daily by mouth.     warfarin (COUMADIN) 2.5 MG tablet TAKE 2 TABLETS BY MOUTH EVERY DAY, EXCEPT 3 TABLETS ON WEDNESDAYS OR AS DIRECTED 192 tablet 3   No current facility-administered medications for this visit.    Allergies as of 10/04/2020 - Review Complete  10/04/2020  Allergen Reaction Noted   Iohexol Hives, Shortness Of Breath, and Other (See Comments) 07/16/2008   Hydrocodone-acetaminophen Hives 04/26/2010   Nabumetone Rash 04/26/2010   Prednisone Rash 10/13/2015    Family History  Problem Relation Age of Onset   Breast cancer Mother    Rheum arthritis Father    Cancer - Lung Sister    Brain cancer Sister    Prostate cancer Brother    Aneurysm Brother    Colon cancer Neg Hx     Social History   Socioeconomic History   Marital status: Married    Spouse name: Not on file   Number of children: 2   Years of education: Not on file   Highest education level: Not on file  Occupational History   Not on file  Tobacco Use   Smoking status: Never   Smokeless tobacco: Never  Vaping Use   Vaping Use: Never used  Substance and Sexual Activity   Alcohol use: No    Alcohol/week: 0.0 standard drinks   Drug use: No   Sexual activity: Not on file  Other Topics Concern   Not on file  Social History Narrative   Not on file   Social Determinants of Health   Financial Resource Strain: Not on file  Food Insecurity: Not on file  Transportation Needs: Not on file  Physical Activity: Not on file  Stress: Not on file  Social Connections: Not on file    Review of Systems: Gen: Denies fever, chills, anorexia. Denies fatigue, weakness, weight loss.  CV: Denies chest pain, palpitations, syncope, peripheral edema, and claudication. Resp: Denies dyspnea at rest, cough, wheezing, coughing up blood, and pleurisy. GI: Denies vomiting blood, jaundice, and fecal incontinence.   Denies dysphagia or odynophagia. Endorses occasional reflux symptoms. Derm: Denies rash, itching, dry skin Psych: Denies depression, anxiety, memory loss, confusion. No homicidal or suicidal ideation.  Heme: Denies bruising, bleeding, and enlarged lymph nodes.  Physical Exam: BP 121/76   Pulse 71   Temp (!) 96.5 F (35.8 C)   Ht 5\' 1"  (1.549 m)   Wt 128 lb (58.1  kg)   BMI 24.19 kg/m  General:   Alert and oriented. No distress noted. Pleasant and cooperative.  Head:  Normocephalic and atraumatic. Eyes:  Conjuctiva clear without scleral icterus. Mouth:  Oral mucosa pink and moist. No lesions. Some missing teeth. Abdomen:  +BS, soft, and non-distended. No rebound or guarding. No HSM or masses  noted. Hyperactive bowel sounds. TTP umbilicus region. Msk:  Symmetrical without gross deformities. Normal posture. Extremities:  Without edema. Neurologic:  Alert and  oriented x4 Psych:  Alert and cooperative. Normal mood and affect.  ASSESSMENT: STEFFIE WAGGONER is an 80 y.o. female presenting today for follow up of  dysphagia, GERD, and constipation. Previous history of colon cancer in 1999 that was treated with lower bowel resection, no chemotherapy. Endoscopy/colonoscopy 10/2018 revealed large hiatal hernia + tortuous esophagus and 80mm polyp at anastomosis as well as diverticulosis throughout, respectively.   Dysphagia no complaints of dysphagia at this time. Previously suspected to be related to missing teeth which she is seeing dentist for.  GERD Doing well on protonix with occasional breakthrough reflux which she takes tums for with good result. Discussed taking protonix 30 mins prior to breakfast instead of in the evening for better results.   Constipation Ongoing, typically has BM daily with occasional stretch of 2-3 days without moving bowels, takes miralax when constipated with relief of symptoms. Discussed miralax every other day or benefiber daily. Continue to drink plenty of water and eat/fruits veggies high in fiber.     PLAN:  Continue protonix with good result, try taking in the am 30 minutes prior to eating instead of in the evening. Can do miralax daily to every other day for constipation. Increase fruits/vegetables like kiwi in diet.  Aim for <5 minutes toilet time.   Follow Up: 1 year, or sooner if needed

## 2020-10-13 ENCOUNTER — Other Ambulatory Visit: Payer: Self-pay

## 2020-10-13 ENCOUNTER — Ambulatory Visit (INDEPENDENT_AMBULATORY_CARE_PROVIDER_SITE_OTHER): Payer: Medicare Other | Admitting: *Deleted

## 2020-10-13 DIAGNOSIS — Z5181 Encounter for therapeutic drug level monitoring: Secondary | ICD-10-CM

## 2020-10-13 DIAGNOSIS — I4819 Other persistent atrial fibrillation: Secondary | ICD-10-CM

## 2020-10-13 LAB — POCT INR: INR: 3.7 — AB (ref 2.0–3.0)

## 2020-10-13 NOTE — Patient Instructions (Signed)
Hold warfarin tonight then decrease dose to 2 tablets daily except 1 tablet on Sundays Recheck in 3 wk Pt not interested in Eliquis at this time due to cost.

## 2020-10-20 ENCOUNTER — Ambulatory Visit (INDEPENDENT_AMBULATORY_CARE_PROVIDER_SITE_OTHER): Payer: Medicare Other | Admitting: Cardiology

## 2020-10-20 ENCOUNTER — Encounter: Payer: Self-pay | Admitting: *Deleted

## 2020-10-20 ENCOUNTER — Encounter: Payer: Self-pay | Admitting: Cardiology

## 2020-10-20 VITALS — BP 124/70 | HR 68 | Ht 61.0 in | Wt 128.8 lb

## 2020-10-20 DIAGNOSIS — Z9889 Other specified postprocedural states: Secondary | ICD-10-CM

## 2020-10-20 DIAGNOSIS — I5032 Chronic diastolic (congestive) heart failure: Secondary | ICD-10-CM | POA: Diagnosis not present

## 2020-10-20 DIAGNOSIS — I4819 Other persistent atrial fibrillation: Secondary | ICD-10-CM | POA: Diagnosis not present

## 2020-10-20 NOTE — Progress Notes (Signed)
Clinical Summary [name] [name] is a 80 y.o.femaleseen today for follow up of the following medical problem.s    1. Elevated blood pressure/Headache -seen in ER at [facility] 09/2019 with headache - severe right sided headache sudden onset. Checked her bp's at home and SBP in 160s, came to ER - in ER benign CT head. Asked to f/u with cardiology for HTN.    - home bp's are variable. From last several clinic notes has been at goal     2. Paroxysmal afib/ Aflutter - s/p MAZE procedure during recent MV repair - management complicated by sinus node dysfunction. Has affected ability to take av nodal agents. Significant brady on amio, now off.    - 03/2019 event monitor with just SR, rare PACs   - no recent palpitaitnos - compliant with meds. No bleeding on coumadin     3 Sinus node dysfunction - followed by EP, watchful waiting. Likely will require ppm at some point - no recent lightheadedness, no dizziness.    4. Chest pain - 2017 cath minimal CAD, distal LAD bridging - no recen tymptoms   4. Mitral regurgitation, s/p MV repair - echo at last [facility] Jan 2017 showed normal LVEF, 3+(modarte) eccentric MR with immobile posterior leaflet.  - repeat echo 01/2016 moderate to severe MR. LVEF 60-65%. LVIDs 31. Symptoms unchanged since last visit. - 01/2016 TEE moderate to severe eccentric MR, severe TR.  - she is s/p mitral valve repair 07/05/16, Sorin Memo 3D Ring Annuloplasty (size 55mm, catalog #[id number], serial [id number])  - repeat echo 10/2016 with LVEF 60-65%, no WMAs, normal MV repair and ring, moderate TR.     - no recent edema, SOB, or DOE       5. Chronic diastolic HF - takes lasix just prn, takes about once a week - no recent symptoms   6. History of GI bleed - followed by GI - notes indicate prior hemorroidal bleeding   - no active issues   7. COPD - followed by Dr Luan Pulling      8. Venous insufficiency - followed by vascular  Past Medical History:  Diagnosis  Date   Anxiety    Arthritis    Asthma    Atrial fibrillation, persistent ([name])    Chronic diastolic congestive heart failure ([name])    Colon adenoma    Colon cancer ([place]) 1999   status post low anterior resection, limited stage disease requiring no adjuvant therapy   COPD (chronic obstructive pulmonary disease) ([name])    Depression    Diverticulosis    DM (dermatomyositis)    DVT (deep venous thrombosis) ([name])    in leg- long time ago   Dyspnea    with activity   Dysrhythmia    Esophageal dysphagia    GERD (gastroesophageal reflux disease)    Headache    Heart murmur    Hematuria    Hemorrhoids    Hiatal hernia    History of kidney stones    x 2   Hypercholesterolemia    Incidental pulmonary nodule 05/08/2016   8 mm vague opacity RML noted on CT scan   Mitral regurgitation    PONV (postoperative nausea and vomiting) 2003 ish    with breast biopsy   S/P minimally invasive maze operation for atrial fibrillation 07/05/2016   Complete bilateral atrial lesion set using cryothermy and bipolar radiofrequency ablation with clipping of LA appendage via right mini thoracotomy approach   S/P minimally invasive mitral valve repair  07/05/2016   Complex valvuloplasty including artificial Gore-tex neochord placement x6 and 30 mm Sorin Memo 3D ring annuloplasty via right mini thoracotomy approach   Schatzki's ring    Tricuspid regurgitation      Allergies  Allergen Reactions   Iohexol Hives, Shortness Of Breath and Other (See Comments)     Pt. had a severe allergic reaction to IV contrast the last time she was injected and had to be seen in the ER.    Hydrocodone-Acetaminophen Hives   Nabumetone Rash   Prednisone Rash     Current Outpatient Medications  Medication Sig Dispense Refill   acetaminophen (TYLENOL) 500 MG tablet Take 1,000 mg by mouth as needed for moderate pain or headache.     albuterol (PROVENTIL) (2.5 MG/3ML) 0.083% nebulizer solution Take 2.5 mg by nebulization  every 6 (six) hours as needed for wheezing or shortness of breath.     ALPRAZolam (XANAX) 0.5 MG tablet Take 0.5 mg by mouth at bedtime.     calcium carbonate (OS-CAL) 600 MG TABS tablet Take 600 mg by mouth 2 (two) times daily with a meal.     citalopram (CELEXA) 20 MG tablet Take 20 mg by mouth daily.     Coenzyme Q10 100 MG TABS Take 100 mg by mouth every evening.      furosemide (LASIX) 20 MG tablet Take 40 mg by mouth 2 (two) times daily.     levothyroxine (SYNTHROID) 75 MCG tablet TAKE 1/2 TABLET(37.5 MCG) BY MOUTH DAILY BEFORE BREAKFAST 45 tablet 0   Multiple Vitamin (MULTIVITAMIN) tablet Take 1 tablet by mouth daily.     pantoprazole (PROTONIX) 40 MG tablet Take 1 tablet (40 mg total) by mouth daily. 90 tablet 3   potassium chloride (KLOR-CON) 10 MEQ tablet Take 10 mEq by mouth every other day.     simvastatin (ZOCOR) 40 MG tablet Take 40 mg daily by mouth.     warfarin (COUMADIN) 2.5 MG tablet TAKE 2 TABLETS BY MOUTH EVERY DAY, EXCEPT 3 TABLETS ON WEDNESDAYS OR AS DIRECTED 192 tablet 3   No current facility-administered medications for this visit.     Past Surgical History:  Procedure Laterality Date   APPENDECTOMY     BREAST SURGERY Right 2003ish   biopsy   CARDIAC CATHETERIZATION N/A 04/12/2016   Procedure: Right/Left Heart Cath and Coronary Angiography;  Surgeon: Leonie Man, MD;  Location: [place] [facility];  Service: Cardiovascular;  Laterality: N/A;   CARDIOVERSION N/A 08/16/2017   Procedure: CARDIOVERSION;  Surgeon: Arnoldo Lenis, MD;  [name];  Service: Endoscopy;  Laterality: N/A;   CATARACT EXTRACTION Bilateral 2017   CLIPPING OF ATRIAL APPENDAGE  07/05/2016   Procedure: CLIPPING OF ATRIAL APPENDAGE;  Surgeon: Rexene Alberts, MD;  Location: [place];  Service: Open Heart Surgery;;   COLONOSCOPY  02/2008   Dr. Gala Romney- external hemorrhoidal tags o/w normal rectal mucosa, s/p surgical resection with a normal appearing anastomosis 12cm, pan colonic  diverticulum   COLONOSCOPY N/A 06/02/2012   [id number] post low anterior resection. Pancolonic diverticulosis. Colonic polyp-tubular adenoma. Surveillance due 2019.    COLONOSCOPY N/A 10/13/2015   Procedure: COLONOSCOPY;  Surgeon: Daneil Dolin, MD;  [name];  Service: Endoscopy;  Laterality: N/A;  0930   COLONOSCOPY WITH PROPOFOL N/A 11/20/2018   Procedure: COLONOSCOPY WITH PROPOFOL;  Surgeon: Daneil Dolin, MD;  [name];  Service: Endoscopy;  Laterality: N/A;  2:30pm   ESOPHAGOGASTRODUODENOSCOPY  07/2007   Dr.  Rourk-probable cervical esophageal web, schatzki ring, large hiatal hernia   ESOPHAGOGASTRODUODENOSCOPY (EGD) WITH PROPOFOL N/A 11/20/2018   Procedure: ESOPHAGOGASTRODUODENOSCOPY (EGD) WITH PROPOFOL;  Surgeon: Daneil Dolin, MD;  [name];  Service: Endoscopy;  Laterality: N/A;   INGUNAL HERNIA REPAIR Left    LEG SKIN LESION  BIOPSY / EXCISION     LOW ANTERIOR BOWEL RESECTION  1999   NO ADJ CHEMO   MINIMALLY INVASIVE MAZE PROCEDURE N/A 07/05/2016   Procedure: MINIMALLY INVASIVE MAZE PROCEDURE;  Surgeon: Rexene Alberts, MD;  Location: [place];  Service: Open Heart Surgery;  Laterality: N/A;   MITRAL VALVE REPAIR Right 07/05/2016   Procedure: MINIMALLY INVASIVE MITRAL VALVE REPAIR (MVR);  Surgeon: Rexene Alberts, MD;  Location: [place];  Service: Open Heart Surgery;  Laterality: Right;   MULTIPLE EXTRACTIONS WITH ALVEOLOPLASTY N/A 05/21/2016   Procedure: MULTIPLE EXTRACTION OF TOOTH #'S 5, 21 WITH ALVEOLOPLASTY AND GROSS DEBRIDEMENT OF TEETH;  Surgeon: Lenn Cal, DDS;  Location: [place];  Service: Oral Surgery;  Laterality: N/A;   POLYPECTOMY  11/20/2018   Procedure: POLYPECTOMY;  Surgeon: Daneil Dolin, MD;  [name];  Service: Endoscopy;;  colon   TEE WITHOUT CARDIOVERSION N/A 02/20/2016   Procedure: TRANSESOPHAGEAL ECHOCARDIOGRAM (TEE);  Surgeon: Arnoldo Lenis, MD;  [name];  Service:  Endoscopy;  Laterality: N/A;   TEE WITHOUT CARDIOVERSION N/A 07/05/2016   Procedure: TRANSESOPHAGEAL ECHOCARDIOGRAM (TEE);  Surgeon: Rexene Alberts, MD;  Location: [place];  Service: Open Heart Surgery;  Laterality: N/A;   TOOTH EXTRACTION     TUBAL LIGATION     VEIN LIGATION AND STRIPPING       Allergies  Allergen Reactions   Iohexol Hives, Shortness Of Breath and Other (See Comments)     Pt. had a severe allergic reaction to IV contrast the last time she was injected and had to be seen in the ER.    Hydrocodone-Acetaminophen Hives   Nabumetone Rash   Prednisone Rash      Family History  Problem Relation Age of Onset   Breast cancer Mother    Rheum arthritis Father    Cancer - Lung Sister    Brain cancer Sister    Prostate cancer Brother    Aneurysm Brother    Colon cancer Neg Hx      Social History [name] [name] reports that she has never smoked. She has never used smokeless tobacco. [name] [name] reports no history of alcohol use.   Review of Systems CONSTITUTIONAL: No weight loss, fever, chills, weakness or fatigue.  HEENT: Eyes: No visual loss, blurred vision, double vision or yellow sclerae.No hearing loss, sneezing, congestion, runny nose or sore throat.  SKIN: No rash or itching.  CARDIOVASCULAR: per hpi RESPIRATORY: No shortness of breath, cough or sputum.  GASTROINTESTINAL: No anorexia, nausea, vomiting or diarrhea. No abdominal pain or blood.  GENITOURINARY: No burning on urination, no polyuria NEUROLOGICAL: No headache, dizziness, syncope, paralysis, ataxia, numbness or tingling in the extremities. No change in bowel or bladder control.  MUSCULOSKELETAL: No muscle, back pain, joint pain or stiffness.  LYMPHATICS: No enlarged nodes. No history of splenectomy.  PSYCHIATRIC: No history of depression or anxiety.  ENDOCRINOLOGIC: No reports of sweating, cold or heat intolerance. No polyuria or polydipsia.  Marland Kitchen   Physical Examination Today's Vitals   10/20/20  1016  BP: 124/70  Pulse: 68  SpO2: 98%  Weight: 128 lb 12.8 oz (58.4 kg)  Height: 5'  1" (1.549 m)   Body mass index is 24.34 kg/m.   Gen: resting comfortably, no acute distress HEENT: no scleral icterus, pupils equal round and reactive, no palptable cervical adenopathy,  CV: RRR, 2/6 systolic murmur rusb, no jvd Resp: Clear to auscultation bilaterally GI: abdomen is soft, non-tender, non-distended, normal bowel sounds, no hepatosplenomegaly MSK: extremities are warm, no edema.  Skin: warm, no rash Neuro:  no focal deficits Psych: appropriate affect   Diagnostic Studies     Assessment and Plan  1. AFib/Aflutter - management limited due to sinus node dysfunction, prior bradycardia on amio. May need pacemaker at some point, followed by ep  - no recent symptoms, continue to monitor at this time. Continue anticoag     2. Mitral regurgitation/Mitral valve repair - s/p mitral valve repair - doing well without symptoms, continue to monitor.        3. Chronic diastolic HF - euvolemic without symptoms, rarely needs her prn diuretic - continue current therapy   F/u 6 months      Arnoldo Lenis, M.D.

## 2020-10-20 NOTE — Patient Instructions (Signed)
Medication Instructions:  Continue all current medications.   Labwork: none  Testing/Procedures: none  Follow-Up: 6 months   Any Other Special Instructions Will Be Listed Below (If Applicable).   If you need a refill on your cardiac medications before your next appointment, please call your pharmacy.  

## 2020-11-01 ENCOUNTER — Telehealth: Payer: Self-pay | Admitting: *Deleted

## 2020-11-01 MED ORDER — WARFARIN SODIUM 2.5 MG PO TABS: ORAL_TABLET | ORAL | 1 refills | Status: DC

## 2020-11-01 NOTE — Telephone Encounter (Signed)
Patient is returning a call to Edrick Oh, RN

## 2020-11-01 NOTE — Telephone Encounter (Signed)
Spoke with pt.  States she needs warfarin Rx sent to [facility].  Rx sent.

## 2020-11-02 ENCOUNTER — Ambulatory Visit (INDEPENDENT_AMBULATORY_CARE_PROVIDER_SITE_OTHER): Payer: Medicare Other | Admitting: *Deleted

## 2020-11-02 DIAGNOSIS — Z5181 Encounter for therapeutic drug level monitoring: Secondary | ICD-10-CM | POA: Diagnosis not present

## 2020-11-02 DIAGNOSIS — I4819 Other persistent atrial fibrillation: Secondary | ICD-10-CM | POA: Diagnosis not present

## 2020-11-02 LAB — POCT INR: INR: 2.3 (ref 2.0–3.0)

## 2020-11-02 NOTE — Patient Instructions (Signed)
Continue warfarin 2 tablets daily except 1 tablet on Sundays Recheck in 4 wk Pt not interested in Eliquis at this time due to cost.

## 2020-11-08 DIAGNOSIS — J449 Chronic obstructive pulmonary disease, unspecified: Secondary | ICD-10-CM | POA: Diagnosis not present

## 2020-11-08 DIAGNOSIS — M25562 Pain in left knee: Secondary | ICD-10-CM | POA: Diagnosis not present

## 2020-11-08 DIAGNOSIS — I4891 Unspecified atrial fibrillation: Secondary | ICD-10-CM | POA: Diagnosis not present

## 2020-11-08 DIAGNOSIS — Z789 Other specified health status: Secondary | ICD-10-CM | POA: Diagnosis not present

## 2020-11-08 DIAGNOSIS — Z299 Encounter for prophylactic measures, unspecified: Secondary | ICD-10-CM | POA: Diagnosis not present

## 2020-11-20 DIAGNOSIS — I4891 Unspecified atrial fibrillation: Secondary | ICD-10-CM | POA: Diagnosis not present

## 2020-11-20 DIAGNOSIS — J441 Chronic obstructive pulmonary disease with (acute) exacerbation: Secondary | ICD-10-CM | POA: Diagnosis not present

## 2020-11-28 ENCOUNTER — Encounter: Payer: Self-pay | Admitting: Cardiology

## 2020-11-28 DIAGNOSIS — R5383 Other fatigue: Secondary | ICD-10-CM | POA: Diagnosis not present

## 2020-11-28 DIAGNOSIS — F418 Other specified anxiety disorders: Secondary | ICD-10-CM | POA: Diagnosis not present

## 2020-11-28 DIAGNOSIS — Z1339 Encounter for screening examination for other mental health and behavioral disorders: Secondary | ICD-10-CM | POA: Diagnosis not present

## 2020-11-28 DIAGNOSIS — E039 Hypothyroidism, unspecified: Secondary | ICD-10-CM | POA: Diagnosis not present

## 2020-11-28 DIAGNOSIS — Z1331 Encounter for screening for depression: Secondary | ICD-10-CM | POA: Diagnosis not present

## 2020-11-28 DIAGNOSIS — Z6829 Body mass index (BMI) 29.0-29.9, adult: Secondary | ICD-10-CM | POA: Diagnosis not present

## 2020-11-28 DIAGNOSIS — Z79899 Other long term (current) drug therapy: Secondary | ICD-10-CM | POA: Diagnosis not present

## 2020-11-28 DIAGNOSIS — Z0000 Encounter for general adult medical examination without abnormal findings: Secondary | ICD-10-CM | POA: Diagnosis not present

## 2020-11-28 DIAGNOSIS — Z7189 Other specified counseling: Secondary | ICD-10-CM | POA: Diagnosis not present

## 2020-11-28 DIAGNOSIS — E785 Hyperlipidemia, unspecified: Secondary | ICD-10-CM | POA: Diagnosis not present

## 2020-11-28 DIAGNOSIS — Z299 Encounter for prophylactic measures, unspecified: Secondary | ICD-10-CM | POA: Diagnosis not present

## 2020-12-01 ENCOUNTER — Ambulatory Visit (INDEPENDENT_AMBULATORY_CARE_PROVIDER_SITE_OTHER): Payer: Medicare Other | Admitting: *Deleted

## 2020-12-01 DIAGNOSIS — I4819 Other persistent atrial fibrillation: Secondary | ICD-10-CM | POA: Diagnosis not present

## 2020-12-01 DIAGNOSIS — Z5181 Encounter for therapeutic drug level monitoring: Secondary | ICD-10-CM

## 2020-12-01 LAB — POCT INR: INR: 2.1 (ref 2.0–3.0)

## 2020-12-01 NOTE — Patient Instructions (Signed)
Continue warfarin 2 tablets daily except 1 tablet on Sundays Recheck in 5 wk Pt not interested in Eliquis at this time due to cost.

## 2020-12-05 DIAGNOSIS — Z23 Encounter for immunization: Secondary | ICD-10-CM | POA: Diagnosis not present

## 2020-12-19 DIAGNOSIS — Z299 Encounter for prophylactic measures, unspecified: Secondary | ICD-10-CM | POA: Diagnosis not present

## 2020-12-19 DIAGNOSIS — J449 Chronic obstructive pulmonary disease, unspecified: Secondary | ICD-10-CM | POA: Diagnosis not present

## 2020-12-19 DIAGNOSIS — R519 Headache, unspecified: Secondary | ICD-10-CM | POA: Diagnosis not present

## 2020-12-19 DIAGNOSIS — F411 Generalized anxiety disorder: Secondary | ICD-10-CM | POA: Diagnosis not present

## 2020-12-19 DIAGNOSIS — Z789 Other specified health status: Secondary | ICD-10-CM | POA: Diagnosis not present

## 2020-12-19 DIAGNOSIS — I4891 Unspecified atrial fibrillation: Secondary | ICD-10-CM | POA: Diagnosis not present

## 2020-12-21 DIAGNOSIS — I081 Rheumatic disorders of both mitral and tricuspid valves: Secondary | ICD-10-CM | POA: Diagnosis not present

## 2020-12-21 DIAGNOSIS — J029 Acute pharyngitis, unspecified: Secondary | ICD-10-CM | POA: Diagnosis not present

## 2020-12-21 DIAGNOSIS — Z79899 Other long term (current) drug therapy: Secondary | ICD-10-CM | POA: Diagnosis not present

## 2020-12-21 DIAGNOSIS — Z20822 Contact with and (suspected) exposure to covid-19: Secondary | ICD-10-CM | POA: Diagnosis not present

## 2020-12-21 DIAGNOSIS — K219 Gastro-esophageal reflux disease without esophagitis: Secondary | ICD-10-CM | POA: Diagnosis not present

## 2020-12-21 DIAGNOSIS — I509 Heart failure, unspecified: Secondary | ICD-10-CM | POA: Diagnosis not present

## 2020-12-21 DIAGNOSIS — F32A Depression, unspecified: Secondary | ICD-10-CM | POA: Diagnosis not present

## 2020-12-21 DIAGNOSIS — Z86718 Personal history of other venous thrombosis and embolism: Secondary | ICD-10-CM | POA: Diagnosis not present

## 2020-12-21 DIAGNOSIS — R29810 Facial weakness: Secondary | ICD-10-CM | POA: Diagnosis not present

## 2020-12-21 DIAGNOSIS — Z8679 Personal history of other diseases of the circulatory system: Secondary | ICD-10-CM | POA: Diagnosis not present

## 2020-12-21 DIAGNOSIS — I110 Hypertensive heart disease with heart failure: Secondary | ICD-10-CM | POA: Diagnosis not present

## 2020-12-21 DIAGNOSIS — F419 Anxiety disorder, unspecified: Secondary | ICD-10-CM | POA: Diagnosis not present

## 2020-12-22 DIAGNOSIS — Z299 Encounter for prophylactic measures, unspecified: Secondary | ICD-10-CM | POA: Diagnosis not present

## 2020-12-22 DIAGNOSIS — J441 Chronic obstructive pulmonary disease with (acute) exacerbation: Secondary | ICD-10-CM | POA: Diagnosis not present

## 2020-12-22 DIAGNOSIS — D6869 Other thrombophilia: Secondary | ICD-10-CM | POA: Diagnosis not present

## 2020-12-22 DIAGNOSIS — R519 Headache, unspecified: Secondary | ICD-10-CM | POA: Diagnosis not present

## 2020-12-22 DIAGNOSIS — Z6825 Body mass index (BMI) 25.0-25.9, adult: Secondary | ICD-10-CM | POA: Diagnosis not present

## 2020-12-22 DIAGNOSIS — Z789 Other specified health status: Secondary | ICD-10-CM | POA: Diagnosis not present

## 2020-12-22 DIAGNOSIS — I4891 Unspecified atrial fibrillation: Secondary | ICD-10-CM | POA: Diagnosis not present

## 2020-12-26 DIAGNOSIS — F32A Depression, unspecified: Secondary | ICD-10-CM | POA: Diagnosis not present

## 2020-12-26 DIAGNOSIS — I5032 Chronic diastolic (congestive) heart failure: Secondary | ICD-10-CM | POA: Diagnosis not present

## 2020-12-26 DIAGNOSIS — R9431 Abnormal electrocardiogram [ECG] [EKG]: Secondary | ICD-10-CM | POA: Diagnosis not present

## 2020-12-26 DIAGNOSIS — I10 Essential (primary) hypertension: Secondary | ICD-10-CM | POA: Diagnosis not present

## 2020-12-26 DIAGNOSIS — M26621 Arthralgia of right temporomandibular joint: Secondary | ICD-10-CM | POA: Diagnosis not present

## 2020-12-26 DIAGNOSIS — Z8042 Family history of malignant neoplasm of prostate: Secondary | ICD-10-CM | POA: Diagnosis not present

## 2020-12-26 DIAGNOSIS — J449 Chronic obstructive pulmonary disease, unspecified: Secondary | ICD-10-CM | POA: Diagnosis not present

## 2020-12-26 DIAGNOSIS — I4891 Unspecified atrial fibrillation: Secondary | ICD-10-CM | POA: Diagnosis not present

## 2020-12-26 DIAGNOSIS — F419 Anxiety disorder, unspecified: Secondary | ICD-10-CM | POA: Diagnosis not present

## 2020-12-26 DIAGNOSIS — R519 Headache, unspecified: Secondary | ICD-10-CM | POA: Diagnosis not present

## 2020-12-26 DIAGNOSIS — Z801 Family history of malignant neoplasm of trachea, bronchus and lung: Secondary | ICD-10-CM | POA: Diagnosis not present

## 2020-12-26 DIAGNOSIS — T465X6A Underdosing of other antihypertensive drugs, initial encounter: Secondary | ICD-10-CM | POA: Diagnosis not present

## 2020-12-26 DIAGNOSIS — I110 Hypertensive heart disease with heart failure: Secondary | ICD-10-CM | POA: Diagnosis not present

## 2020-12-26 DIAGNOSIS — I081 Rheumatic disorders of both mitral and tricuspid valves: Secondary | ICD-10-CM | POA: Diagnosis not present

## 2020-12-26 DIAGNOSIS — K219 Gastro-esophageal reflux disease without esophagitis: Secondary | ICD-10-CM | POA: Diagnosis not present

## 2020-12-26 DIAGNOSIS — Z85038 Personal history of other malignant neoplasm of large intestine: Secondary | ICD-10-CM | POA: Diagnosis not present

## 2020-12-26 DIAGNOSIS — Z803 Family history of malignant neoplasm of breast: Secondary | ICD-10-CM | POA: Diagnosis not present

## 2020-12-26 DIAGNOSIS — R6884 Jaw pain: Secondary | ICD-10-CM | POA: Diagnosis not present

## 2020-12-26 DIAGNOSIS — Z9114 Patient's other noncompliance with medication regimen: Secondary | ICD-10-CM | POA: Diagnosis not present

## 2020-12-26 DIAGNOSIS — Z86718 Personal history of other venous thrombosis and embolism: Secondary | ICD-10-CM | POA: Diagnosis not present

## 2021-01-05 ENCOUNTER — Ambulatory Visit (INDEPENDENT_AMBULATORY_CARE_PROVIDER_SITE_OTHER): Payer: Medicare Other | Admitting: *Deleted

## 2021-01-05 DIAGNOSIS — Z5181 Encounter for therapeutic drug level monitoring: Secondary | ICD-10-CM | POA: Diagnosis not present

## 2021-01-05 DIAGNOSIS — I4819 Other persistent atrial fibrillation: Secondary | ICD-10-CM

## 2021-01-05 LAB — POCT INR: INR: 2.1 (ref 2.0–3.0)

## 2021-01-05 NOTE — Patient Instructions (Signed)
Description   Continue warfarin 2 tablets daily except 1 tablet on Sundays Recheck in 6 wk Pt not interested in Eliquis at this time due to cost.

## 2021-01-13 ENCOUNTER — Telehealth: Payer: Self-pay | Admitting: Cardiology

## 2021-01-13 NOTE — Telephone Encounter (Signed)
Patient called stating that she has had a headache and elevated BP for a week now. Went to see her PCP today and was given Tramadol 50 mg . Patient is concerned about taking this new medication. Please call [phone].

## 2021-01-13 NOTE — Telephone Encounter (Signed)
Advised patient to keep log of BP readings over the next 2 weeks & call office back with readings & update.   Also informed her to only used the Tramadol in moderation and only if needed for the headache.  Would try Tylenol first though.  She verbalized understanding.

## 2021-01-20 DIAGNOSIS — J441 Chronic obstructive pulmonary disease with (acute) exacerbation: Secondary | ICD-10-CM | POA: Diagnosis not present

## 2021-01-20 DIAGNOSIS — I4891 Unspecified atrial fibrillation: Secondary | ICD-10-CM | POA: Diagnosis not present

## 2021-01-31 DIAGNOSIS — Z20828 Contact with and (suspected) exposure to other viral communicable diseases: Secondary | ICD-10-CM | POA: Diagnosis not present

## 2021-01-31 DIAGNOSIS — Z23 Encounter for immunization: Secondary | ICD-10-CM | POA: Diagnosis not present

## 2021-02-16 ENCOUNTER — Ambulatory Visit (INDEPENDENT_AMBULATORY_CARE_PROVIDER_SITE_OTHER): Payer: Medicare Other | Admitting: *Deleted

## 2021-02-16 ENCOUNTER — Other Ambulatory Visit: Payer: Self-pay

## 2021-02-16 DIAGNOSIS — Z5181 Encounter for therapeutic drug level monitoring: Secondary | ICD-10-CM | POA: Diagnosis not present

## 2021-02-16 DIAGNOSIS — I4819 Other persistent atrial fibrillation: Secondary | ICD-10-CM

## 2021-02-16 LAB — POCT INR: INR: 1.6 — AB (ref 2.0–3.0)

## 2021-02-16 NOTE — Patient Instructions (Signed)
Description   Take 3 tabs of warfarin today and tomorrow, then continue to take warfarin 2 tablets daily except for 1 tablet on Sundays. Recheck INR in 2 weeks.

## 2021-02-28 DIAGNOSIS — Z789 Other specified health status: Secondary | ICD-10-CM | POA: Diagnosis not present

## 2021-02-28 DIAGNOSIS — I4891 Unspecified atrial fibrillation: Secondary | ICD-10-CM | POA: Diagnosis not present

## 2021-02-28 DIAGNOSIS — F411 Generalized anxiety disorder: Secondary | ICD-10-CM | POA: Diagnosis not present

## 2021-02-28 DIAGNOSIS — I10 Essential (primary) hypertension: Secondary | ICD-10-CM | POA: Diagnosis not present

## 2021-02-28 DIAGNOSIS — J441 Chronic obstructive pulmonary disease with (acute) exacerbation: Secondary | ICD-10-CM | POA: Diagnosis not present

## 2021-02-28 DIAGNOSIS — Z299 Encounter for prophylactic measures, unspecified: Secondary | ICD-10-CM | POA: Diagnosis not present

## 2021-03-06 ENCOUNTER — Other Ambulatory Visit: Payer: Self-pay

## 2021-03-06 ENCOUNTER — Ambulatory Visit (INDEPENDENT_AMBULATORY_CARE_PROVIDER_SITE_OTHER): Payer: Medicare Other | Admitting: *Deleted

## 2021-03-06 DIAGNOSIS — I4819 Other persistent atrial fibrillation: Secondary | ICD-10-CM

## 2021-03-06 DIAGNOSIS — Z5181 Encounter for therapeutic drug level monitoring: Secondary | ICD-10-CM

## 2021-03-06 LAB — POCT INR: INR: 2.2 (ref 2.0–3.0)

## 2021-03-06 NOTE — Patient Instructions (Signed)
Continue warfarin 2 tablets daily except for 1 tablet on Sundays. Recheck INR in 4 weeks.

## 2021-03-17 DIAGNOSIS — Z299 Encounter for prophylactic measures, unspecified: Secondary | ICD-10-CM | POA: Diagnosis not present

## 2021-03-17 DIAGNOSIS — I10 Essential (primary) hypertension: Secondary | ICD-10-CM | POA: Diagnosis not present

## 2021-03-17 DIAGNOSIS — R000 Tachycardia, unspecified: Secondary | ICD-10-CM | POA: Diagnosis not present

## 2021-03-17 DIAGNOSIS — Z6825 Body mass index (BMI) 25.0-25.9, adult: Secondary | ICD-10-CM | POA: Diagnosis not present

## 2021-03-20 ENCOUNTER — Telehealth: Payer: Self-pay | Admitting: Cardiology

## 2021-03-20 NOTE — Telephone Encounter (Signed)
Pt c/o fast hr, sob, chest pressure but denied cp. Pt stated she went to PCP on Friday due to hr being elevated and Eduard Clos, PA prescribed Metoprolol Tartrate 25 mg tablets once daily. Pt stated that this helped for a day or so with lowering her hr into the 90's, but has since been elevated into the lower 100's.   Please advise

## 2021-03-20 NOTE — Telephone Encounter (Signed)
STAT if HR is under 50 or over 120 (normal HR is 60-100 beats per minute)  What is your heart rate? 107  Do you have a log of your heart rate readings (document readings)?   111, 101, 107  Do you have any other symptoms? no  Patient went to her PCP on Friday and was given some medication for her HR. She was not sure what to do now. Please advise

## 2021-03-21 ENCOUNTER — Other Ambulatory Visit: Payer: Self-pay

## 2021-03-21 ENCOUNTER — Ambulatory Visit (INDEPENDENT_AMBULATORY_CARE_PROVIDER_SITE_OTHER): Payer: Medicare Other

## 2021-03-21 VITALS — BP 142/96 | HR 111 | Ht 61.0 in | Wt 134.2 lb

## 2021-03-21 DIAGNOSIS — I4819 Other persistent atrial fibrillation: Secondary | ICD-10-CM | POA: Diagnosis not present

## 2021-03-21 NOTE — Telephone Encounter (Signed)
Can she come in for a nursing visit with vitals and EKG   [name] MD

## 2021-03-21 NOTE — Progress Notes (Signed)
Pt is present today for a Nurse Visit for EKG/Vitals for elevated hr.  Pt is c/o SOB, chest tightness Pt stated that she has started using her nebulizer 2-3x daily- usually it's only once daily Pt states she is compliant with medication- Eduard Clos, NP with Dr. Trena Platt office prescribed Metoprolol Tartrate 25 mg tablets daily. Pt just picked prescription up.  EKG scanned to Dr. [name].

## 2021-03-21 NOTE — Telephone Encounter (Signed)
Pt to come in at 4 pm 11/29 for nursing vitals and ekg per Dr. [name] d/t elevated HR.

## 2021-03-22 ENCOUNTER — Telehealth: Payer: Self-pay | Admitting: *Deleted

## 2021-03-22 DIAGNOSIS — I10 Essential (primary) hypertension: Secondary | ICD-10-CM | POA: Diagnosis not present

## 2021-03-22 DIAGNOSIS — E785 Hyperlipidemia, unspecified: Secondary | ICD-10-CM | POA: Diagnosis not present

## 2021-03-22 MED ORDER — METOPROLOL TARTRATE 25 MG PO TABS: 12.5000 mg | ORAL_TABLET | Freq: Two times a day (BID) | ORAL | 11 refills | Status: DC

## 2021-03-22 NOTE — Telephone Encounter (Signed)
-----   Message from Arnoldo Lenis, MD sent at 03/22/2021 10:30 AM EST -----    ----- Message ----- From: Berlinda Last, CMA Sent: 03/21/2021   3:55 PM EST To: Arnoldo Lenis, MD

## 2021-03-22 NOTE — Telephone Encounter (Signed)
EKG shows heart is in an abnormal rhythm explaining why rates are elevated. Is she still taking the lopressor 25mg  once daily? If so I would split it up in to 12.5mg  bid, may take additioanl 12.5mg  as needed. Can she update [name] on symptoms and heart rates at the end of the week     [name] MD  Pt notified and voiced understanding

## 2021-03-22 NOTE — Progress Notes (Signed)
EKG shows heart is in an abnormal rhythm explaining why rates are elevated. Is she still taking the lopressor 25mg  once daily? If so I would split it up in to 12.5mg  bid, may take additioanl 12.5mg  as needed. Can she update [name] on symptoms and heart rates at the end of the week   [name] MD

## 2021-03-24 DIAGNOSIS — J449 Chronic obstructive pulmonary disease, unspecified: Secondary | ICD-10-CM | POA: Diagnosis not present

## 2021-03-24 DIAGNOSIS — I10 Essential (primary) hypertension: Secondary | ICD-10-CM | POA: Diagnosis not present

## 2021-03-24 DIAGNOSIS — R000 Tachycardia, unspecified: Secondary | ICD-10-CM | POA: Diagnosis not present

## 2021-03-24 DIAGNOSIS — Z299 Encounter for prophylactic measures, unspecified: Secondary | ICD-10-CM | POA: Diagnosis not present

## 2021-03-28 DIAGNOSIS — R000 Tachycardia, unspecified: Secondary | ICD-10-CM | POA: Diagnosis not present

## 2021-03-28 DIAGNOSIS — R457 State of emotional shock and stress, unspecified: Secondary | ICD-10-CM | POA: Diagnosis not present

## 2021-03-28 DIAGNOSIS — R42 Dizziness and giddiness: Secondary | ICD-10-CM | POA: Diagnosis not present

## 2021-03-28 DIAGNOSIS — I10 Essential (primary) hypertension: Secondary | ICD-10-CM | POA: Diagnosis not present

## 2021-04-03 ENCOUNTER — Ambulatory Visit (INDEPENDENT_AMBULATORY_CARE_PROVIDER_SITE_OTHER): Payer: Medicare Other | Admitting: *Deleted

## 2021-04-03 DIAGNOSIS — Z5181 Encounter for therapeutic drug level monitoring: Secondary | ICD-10-CM | POA: Diagnosis not present

## 2021-04-03 DIAGNOSIS — I4819 Other persistent atrial fibrillation: Secondary | ICD-10-CM | POA: Diagnosis not present

## 2021-04-03 LAB — POCT INR: INR: 3.3 — AB (ref 2.0–3.0)

## 2021-04-03 NOTE — Patient Instructions (Signed)
Hold warfarin tonight then resume 2 tablets daily except for 1 tablet on Sundays. Recheck INR in 4 weeks.

## 2021-04-04 ENCOUNTER — Other Ambulatory Visit: Payer: Self-pay | Admitting: Nurse Practitioner

## 2021-04-04 DIAGNOSIS — K219 Gastro-esophageal reflux disease without esophagitis: Secondary | ICD-10-CM

## 2021-04-07 ENCOUNTER — Telehealth: Payer: Self-pay | Admitting: *Deleted

## 2021-04-07 NOTE — Telephone Encounter (Signed)
Patient informed and verbalized understanding of plan. 

## 2021-04-07 NOTE — Telephone Encounter (Signed)
-----   Message from Arnoldo Lenis, MD sent at 04/07/2021 10:14 AM EST ----- BP's and heart rates overall look fine from submitted numbers   [name] MD

## 2021-04-21 ENCOUNTER — Encounter: Payer: Self-pay | Admitting: Cardiology

## 2021-04-21 ENCOUNTER — Other Ambulatory Visit: Payer: Self-pay

## 2021-04-21 ENCOUNTER — Ambulatory Visit (INDEPENDENT_AMBULATORY_CARE_PROVIDER_SITE_OTHER): Payer: Medicare Other | Admitting: Cardiology

## 2021-04-21 VITALS — BP 128/84 | HR 82 | Ht 61.0 in | Wt 138.4 lb

## 2021-04-21 DIAGNOSIS — Z9889 Other specified postprocedural states: Secondary | ICD-10-CM | POA: Diagnosis not present

## 2021-04-21 DIAGNOSIS — I4891 Unspecified atrial fibrillation: Secondary | ICD-10-CM

## 2021-04-21 DIAGNOSIS — I495 Sick sinus syndrome: Secondary | ICD-10-CM | POA: Diagnosis not present

## 2021-04-21 DIAGNOSIS — J449 Chronic obstructive pulmonary disease, unspecified: Secondary | ICD-10-CM

## 2021-04-21 DIAGNOSIS — I5032 Chronic diastolic (congestive) heart failure: Secondary | ICD-10-CM | POA: Diagnosis not present

## 2021-04-21 NOTE — Progress Notes (Signed)
Clinical Summary [name] [name] is a 80 y.o.female seen today for follow up of the following medical problem.s           1. Paroxysmal afib/ Aflutter - s/p MAZE procedure during recent MV repair - management complicated by sinus node dysfunction. Has affected ability to take av nodal agents. Significant brady on amio, now off.    - 03/2019 event monitor with just SR, rare PACs   - recent palpitations in 02/2021 - taking lopressor 12.5mg  bid. Will take prn 12.5mg  - symptoms are tolerable at this time.   2 Sinus node dysfunction - followed by EP, watchful waiting. Likely will require ppm at some point - no significant lightheadedness or dizziness      3. Mitral regurgitation, s/p MV repair - echo at last [facility] Jan 2017 showed normal LVEF, 3+(modarte) eccentric MR with immobile posterior leaflet.  - repeat echo 01/2016 moderate to severe MR. LVEF 60-65%. LVIDs 31. Symptoms unchanged since last visit. - 01/2016 TEE moderate to severe eccentric MR, severe TR.  - she is s/p mitral valve repair 07/05/16, Sorin Memo 3D Ring Annuloplasty (size 79mm, catalog #[id number], serial [id number])  - repeat echo 10/2016 with LVEF 60-65%, no WMAs, normal MV repair and ring, moderate TR.     - some recent SOB/DOE, improves with her prn inhaler. Does not appear she is on daily maintenance inahler.  - can have some LE edema. Has been taking her lasix just prn, about 1-2 times perm month       4. Chronic diastolic HF - takes lasix just prn, takes about once a week - no recent symptoms   5. History of GI bleed - followed by GI - notes indicate prior hemorroidal bleeding      6. COPD - reports recent SOB, using her prn inhaler quite often. Does not appear she is on a maintenance inhaler. Her previous pulmonogist Dr Luan Pulling has retired.        7. Venous insufficiency - followed by vascular   Past Medical History:  Diagnosis Date   Anxiety    Arthritis    Asthma    Atrial  fibrillation, persistent ([name])    Chronic diastolic congestive heart failure ([name])    Colon adenoma    Colon cancer ([place]) 1999   status post low anterior resection, limited stage disease requiring no adjuvant therapy   COPD (chronic obstructive pulmonary disease) ([name])    Depression    Diverticulosis    DM (dermatomyositis)    DVT (deep venous thrombosis) ([name])    in leg- long time ago   Dyspnea    with activity   Dysrhythmia    Esophageal dysphagia    GERD (gastroesophageal reflux disease)    Headache    Heart murmur    Hematuria    Hemorrhoids    Hiatal hernia    History of kidney stones    x 2   Hypercholesterolemia    Incidental pulmonary nodule 05/08/2016   8 mm vague opacity RML noted on CT scan   Mitral regurgitation    PONV (postoperative nausea and vomiting) 2003 ish    with breast biopsy   S/P minimally invasive maze operation for atrial fibrillation 07/05/2016   Complete bilateral atrial lesion set using cryothermy and bipolar radiofrequency ablation with clipping of LA appendage via right mini thoracotomy approach   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/05/2016   Complex valvuloplasty including artificial Gore-tex neochord placement x6 and 30 mm Sorin  Memo 3D ring annuloplasty via right mini thoracotomy approach   Schatzki's ring    Tricuspid regurgitation      Allergies  Allergen Reactions   Iohexol Hives, Shortness Of Breath and Other (See Comments)     Pt. had a severe allergic reaction to IV contrast the last time she was injected and had to be seen in the ER.    Hydrocodone-Acetaminophen Hives   Nabumetone Rash   Prednisone Rash     Current Outpatient Medications  Medication Sig Dispense Refill   acetaminophen (TYLENOL) 500 MG tablet Take 1,000 mg by mouth as needed for moderate pain or headache.     albuterol (PROVENTIL) (2.5 MG/3ML) 0.083% nebulizer solution Take 2.5 mg by nebulization every 6 (six) hours as needed for wheezing or  shortness of breath.     ALPRAZolam (XANAX) 0.5 MG tablet Take 0.5 mg by mouth at bedtime.     calcium carbonate (OS-CAL) 600 MG TABS tablet Take 600 mg by mouth 2 (two) times daily with a meal.     citalopram (CELEXA) 20 MG tablet Take 20 mg by mouth daily.     Coenzyme Q10 100 MG TABS Take 100 mg by mouth every evening.      furosemide (LASIX) 20 MG tablet Take 40 mg by mouth 2 (two) times daily.     levothyroxine (SYNTHROID) 75 MCG tablet TAKE 1/2 TABLET(37.5 MCG) BY MOUTH DAILY BEFORE BREAKFAST 45 tablet 0   metoprolol tartrate (LOPRESSOR) 25 MG tablet Take 0.5 tablets (12.5 mg total) by mouth 2 (two) times daily. 30 tablet 11   Multiple Vitamin (MULTIVITAMIN) tablet Take 1 tablet by mouth daily.     pantoprazole (PROTONIX) 40 MG tablet TAKE 1 TABLET(40 MG) BY MOUTH DAILY 90 tablet 3   potassium chloride (KLOR-CON) 10 MEQ tablet Take 10 mEq by mouth every other day.     simvastatin (ZOCOR) 40 MG tablet Take 40 mg daily by mouth.     warfarin (COUMADIN) 2.5 MG tablet TAKE 2 TABLETS BY MOUTH EVERY DAY EXCEPT 1 TABLET ON SUNDAYS OR AS DIRECTED 180 tablet 1   No current facility-administered medications for this visit.     Past Surgical History:  Procedure Laterality Date   APPENDECTOMY     BREAST SURGERY Right 2003ish   biopsy   CARDIAC CATHETERIZATION N/A 04/12/2016   Procedure: Right/Left Heart Cath and Coronary Angiography;  Surgeon: Leonie Man, MD;  Location: [facility];  Service: Cardiovascular;  Laterality: N/A;   CARDIOVERSION N/A 08/16/2017   Procedure: CARDIOVERSION;  Surgeon: Arnoldo Lenis, MD;  Location: [name];  Service: Endoscopy;  Laterality: N/A;   CATARACT EXTRACTION Bilateral 2017   CLIPPING OF ATRIAL APPENDAGE  07/05/2016   Procedure: CLIPPING OF ATRIAL APPENDAGE;  Surgeon: Rexene Alberts, MD;  Location: [facility];  Service: Open Heart Surgery;;   COLONOSCOPY  02/2008   Dr. Gala Romney- external hemorrhoidal tags o/w normal rectal mucosa, s/p surgical  resection with a normal appearing anastomosis 12cm, pan colonic diverticulum   COLONOSCOPY N/A 06/02/2012   TIW:[id number] post low anterior resection. Pancolonic diverticulosis. Colonic polyp-tubular adenoma. Surveillance due 2019.    COLONOSCOPY N/A 10/13/2015   Procedure: COLONOSCOPY;  Surgeon: Daneil Dolin, MD;  Location: [name];  Service: Endoscopy;  Laterality: N/A;  0930   COLONOSCOPY WITH PROPOFOL N/A 11/20/2018   Procedure: COLONOSCOPY WITH PROPOFOL;  Surgeon: Daneil Dolin, MD;  [name];  Service: Endoscopy;  Laterality: N/A;  2:30pm  ESOPHAGOGASTRODUODENOSCOPY  07/2007   Dr. Daiva Nakayama cervical esophageal web, schatzki ring, large hiatal hernia   ESOPHAGOGASTRODUODENOSCOPY (EGD) WITH PROPOFOL N/A 11/20/2018   Procedure: ESOPHAGOGASTRODUODENOSCOPY (EGD) WITH PROPOFOL;  Surgeon: Daneil Dolin, MD;  [name];  Service: Endoscopy;  Laterality: N/A;   INGUNAL HERNIA REPAIR Left    LEG SKIN LESION  BIOPSY / EXCISION     LOW ANTERIOR BOWEL RESECTION  1999   NO ADJ CHEMO   MINIMALLY INVASIVE MAZE PROCEDURE N/A 07/05/2016   Procedure: MINIMALLY INVASIVE MAZE PROCEDURE;  Surgeon: Rexene Alberts, MD;  Location: [facility];  Service: Open Heart Surgery;  Laterality: N/A;   MITRAL VALVE REPAIR Right 07/05/2016   Procedure: MINIMALLY INVASIVE MITRAL VALVE REPAIR (MVR);  Surgeon: Rexene Alberts, MD;  Location: [facility];  Service: Open Heart Surgery;  Laterality: Right;   MULTIPLE EXTRACTIONS WITH ALVEOLOPLASTY N/A 05/21/2016   Procedure: MULTIPLE EXTRACTION OF TOOTH #'S 5, 21 WITH ALVEOLOPLASTY AND GROSS DEBRIDEMENT OF TEETH;  Surgeon: Lenn Cal, DDS;  Location: [place];  Service: Oral Surgery;  Laterality: N/A;   POLYPECTOMY  11/20/2018   Procedure: POLYPECTOMY;  Surgeon: Daneil Dolin, MD;  [name];  Service: Endoscopy;;  colon   TEE WITHOUT CARDIOVERSION N/A 02/20/2016   Procedure: TRANSESOPHAGEAL ECHOCARDIOGRAM (TEE);  Surgeon:  Arnoldo Lenis, MD;  [name];  Service: Endoscopy;  Laterality: N/A;   TEE WITHOUT CARDIOVERSION N/A 07/05/2016   Procedure: TRANSESOPHAGEAL ECHOCARDIOGRAM (TEE);  Surgeon: Rexene Alberts, MD;  Location: [place] [facility];  Service: Open Heart Surgery;  Laterality: N/A;   TOOTH EXTRACTION     TUBAL LIGATION     VEIN LIGATION AND STRIPPING       Allergies  Allergen Reactions   Iohexol Hives, Shortness Of Breath and Other (See Comments)     Pt. had a severe allergic reaction to IV contrast the last time she was injected and had to be seen in the ER.    Hydrocodone-Acetaminophen Hives   Nabumetone Rash   Prednisone Rash      Family History  Problem Relation Age of Onset   Breast cancer Mother    Rheum arthritis Father    Cancer - Lung Sister    Brain cancer Sister    Prostate cancer Brother    Aneurysm Brother    Colon cancer Neg Hx      Social History [name] [name] reports that she has never smoked. She has never used smokeless tobacco. [name] [name] reports no history of alcohol use.   Review of Systems CONSTITUTIONAL: No weight loss, fever, chills, weakness or fatigue.  HEENT: Eyes: No visual loss, blurred vision, double vision or yellow sclerae.No hearing loss, sneezing, congestion, runny nose or sore throat.  SKIN: No rash or itching.  CARDIOVASCULAR: per hpi RESPIRATORY: No shortness of breath, cough or sputum.  GASTROINTESTINAL: No anorexia, nausea, vomiting or diarrhea. No abdominal pain or blood.  GENITOURINARY: No burning on urination, no polyuria NEUROLOGICAL: No headache, dizziness, syncope, paralysis, ataxia, numbness or tingling in the extremities. No change in bowel or bladder control.  MUSCULOSKELETAL: No muscle, back pain, joint pain or stiffness.  LYMPHATICS: No enlarged nodes. No history of splenectomy.  PSYCHIATRIC: No history of depression or anxiety.  ENDOCRINOLOGIC: No reports of sweating, cold or heat intolerance. No polyuria or  polydipsia.  Marland Kitchen   Physical Examination Today's Vitals   04/21/21 1014  BP: 128/84  Pulse: 82  SpO2: 97%  Weight: 138 lb 6.4  oz (62.8 kg)  Height: 5\' 1"  (1.549 m)   Body mass index is 26.15 kg/m.  Gen: resting comfortably, no acute distress HEENT: no scleral icterus, pupils equal round and reactive, no palptable cervical adenopathy,  CV: RRR, no m/r/g. Mildly elevated JVD Resp: Clear to auscultation bilaterally GI: abdomen is soft, non-tender, non-distended, normal bowel sounds, no hepatosplenomegaly MSK: extremities are warm, trace bilateral edema Skin: warm, no rash Neuro:  no focal deficits Psych: appropriate affect   Diagnostic Studies     Assessment and Plan   1. AFib/Aflutter - management limited due to sinus node dysfunction, prior bradycardia on amio. May need pacemaker at some point, followed by ep  - symptoms are tolerablte at this time. We discussed again possibly a pacemaker which would allow [name] to be more aggressive with medications to suppress her tachyarrhythmias. At this time she reports symptoms are tolerable and favors to continue monitoring for now.      2. Mitral regurgitation/Mitral valve repair - s/p mitral valve repair - continues to do well, continue to monitori.        3. Chronic diastolic HF - mild fluid overload today, encouraged more regular use of her lasix  4. COPD - recent increase in symptoms, appears just on rescue inhaler and no maintenance therapy. Prior pulmonologist Dr Luan Pulling has retired, she would like to establish with Dr Halford Chessman with [facility] .    F/u 6 months  Arnoldo Lenis, M.D.

## 2021-04-21 NOTE — Patient Instructions (Signed)
Medication Instructions:  Continue all current medications.  Labwork: none  Testing/Procedures: none  Follow-Up: 6 months   Any Other Special Instructions Will Be Listed Below (If Applicable). You have been referred to Pulmonology    If you need a refill on your cardiac medications before your next appointment, please call your pharmacy.  

## 2021-04-25 ENCOUNTER — Encounter: Payer: Self-pay | Admitting: *Deleted

## 2021-04-28 ENCOUNTER — Other Ambulatory Visit: Payer: Self-pay | Admitting: Cardiology

## 2021-04-28 NOTE — Telephone Encounter (Signed)
Prescription refill request received for warfarin [place]: [place], 04/21/2021 Next INR check: 1/9 Warfarin tablet strength:  2.5 mg

## 2021-05-01 ENCOUNTER — Ambulatory Visit (INDEPENDENT_AMBULATORY_CARE_PROVIDER_SITE_OTHER): Payer: Medicare Other | Admitting: *Deleted

## 2021-05-01 DIAGNOSIS — Z5181 Encounter for therapeutic drug level monitoring: Secondary | ICD-10-CM | POA: Diagnosis not present

## 2021-05-01 DIAGNOSIS — I4819 Other persistent atrial fibrillation: Secondary | ICD-10-CM

## 2021-05-01 LAB — POCT INR: INR: 1.6 — AB (ref 2.0–3.0)

## 2021-05-01 NOTE — Patient Instructions (Signed)
Take warfarin 3 tablets tonight and tomorrow night then resume 2 tablets daily except for 1 tablet on Sundays. Recheck INR in 3 weeks.

## 2021-05-05 ENCOUNTER — Encounter: Payer: Self-pay | Admitting: Pulmonary Disease

## 2021-05-05 ENCOUNTER — Ambulatory Visit (INDEPENDENT_AMBULATORY_CARE_PROVIDER_SITE_OTHER): Payer: Medicare Other | Admitting: Pulmonary Disease

## 2021-05-05 ENCOUNTER — Other Ambulatory Visit: Payer: Self-pay

## 2021-05-05 VITALS — BP 122/74 | HR 101 | Temp 98.4°F | Ht 61.0 in | Wt 135.0 lb

## 2021-05-05 DIAGNOSIS — J301 Allergic rhinitis due to pollen: Secondary | ICD-10-CM

## 2021-05-05 DIAGNOSIS — J449 Chronic obstructive pulmonary disease, unspecified: Secondary | ICD-10-CM | POA: Diagnosis not present

## 2021-05-05 MED ORDER — FLUTICASONE PROPIONATE 50 MCG/ACT NA SUSP: 1.0000 | Freq: Every day | NASAL | 5 refills | Status: AC

## 2021-05-05 MED ORDER — BREZTRI AEROSPHERE 160-9-4.8 MCG/ACT IN AERO: 2.0000 | INHALATION_SPRAY | Freq: Two times a day (BID) | RESPIRATORY_TRACT | 0 refills | Status: DC

## 2021-05-05 MED ORDER — BREZTRI AEROSPHERE 160-9-4.8 MCG/ACT IN AERO: 2.0000 | INHALATION_SPRAY | Freq: Two times a day (BID) | RESPIRATORY_TRACT | 5 refills | Status: DC

## 2021-05-05 NOTE — Addendum Note (Signed)
Addended by: Fritzi Mandes D on: 05/05/2021 12:42 PM   Modules accepted: Orders

## 2021-05-05 NOTE — Patient Instructions (Signed)
Will have you sign a release form to get copy of your lab tests and chest imaging studies from Dr. Marcial Pacas office and from [place] two puffs in the morning and two puffs in the evening, and rinse your mouth after each use  Use saline nasal rinse followed by flonase 1 spray in each nostril nightly  Albuterol inhaler or nebulizer every 6 hours as needed for cough, wheeze, chest congestion, or shortness of breath  Follow up in 3 months

## 2021-05-05 NOTE — Progress Notes (Signed)
[name] [name], [name] [name], [name] [name]  Chief Complaint  Patient presents with   Consult    COPD  Saw Dr. Luan Pulling before he retired.   SOB, wheezing     Past Surgical History:  She  has a past surgical history that includes Colonoscopy (02/2008); INGUNAL HERNIA REPAIR (Left); Tubal ligation; Low anterior bowel resection (1999); Appendectomy; Vein ligation [name] stripping; Esophagogastroduodenoscopy (07/2007); Colonoscopy (N/A, 06/02/2012); Colonoscopy (N/A, 10/13/2015); TEE without cardioversion (N/A, 02/20/2016); Cardiac catheterization (N/A, 04/12/2016); Cataract extraction (Bilateral, 2017); Breast surgery (Right, 2003ish); Multiple extractions with alveoloplasty (N/A, 05/21/2016); Tooth extraction; Mitral valve repair (Right, 07/05/2016); Minimally invasive maze procedure (N/A, 07/05/2016); TEE without cardioversion (N/A, 07/05/2016); Clipping of atrial appendage (07/05/2016); Cardioversion (N/A, 08/16/2017); Colonoscopy with propofol (N/A, 11/20/2018); Esophagogastroduodenoscopy (egd) with propofol (N/A, 11/20/2018); polypectomy (11/20/2018); [name] [name] skin lesion  biopsy / excision.  Past Medical History:  Anxiety, OA, A fib s/p MAZE, Diastolic CHF, Colon polyp, Colon cancer 1999, Depression, Diverticulosis, DM type 2, DVT in [name], Esophageal stricture, GERD, Hiatal hernia, Nephrolithiasis, HLD, Mitral regurgitation, Schatzki's ring  Constitutional:  BP 122/74 (BP Location: Left Arm, Patient Position: Sitting)    Pulse (!) 101    Temp 98.4 F (36.9 C) (Temporal)    Ht 5\' 1"  (1.549 m)    Wt 135 lb 0.6 oz (61.3 kg)    SpO2 97% Comment: ra   BMI 25.52 kg/m   Brief Summary:  [name] [name] is a 80 y.o. female with COPD with asthma [name] allergies.      Subjective:   She was previously seen by Dr. Luan Pulling.  She never smoked cigarettes, but her husband did.  She gets seasonal allergies.  Worse in Spring [name] Fall.  Has trouble around dust [name] cold weather.  Also has  trouble around humid weather.  She was started on breztri over the Summer.  This helped, but she didn't realize she was supposed to use this on a regular basis.  She has been using albuterol in her nebulizer every night.    She gets cough [name] wheeze with chest tightness.  She coughs up milky sputum.  No recent fever.  She coughed up blood once, but that was several years ago.  She gets winded after walking 1/2 block.  She is sleeping okay.  Recently started on [name] by cardiology to help control her heart rate.  She maintained her SpO2 > 93% on room air while walking in office today.  Physical Exam:   Appearance - well kempt   ENMT - no sinus tenderness, no oral exudate, no LAN, Mallampati 2 airway, no stridor  Respiratory - equal breath sounds bilaterally, no wheezing or rales  CV - s1s2 regular rate [name] rhythm, no murmurs  Ext - no clubbing, no edema  Skin - no rashes  Psych - normal mood [name] affect   [name] testing:  PFT 05/11/16 >> FEV1 1.47 (77%), FEV1% 68, TLC 4.84 (101%), DLCO 65%, +BD  Cardiac Tests:  Echo 10/26/16 >> EF 60 to 65%, s/p MV valvuloplasty, PASP 43 mmHg  Social History:  She  reports that she has never smoked. She has been exposed to tobacco smoke. She has never used smokeless tobacco. She reports that she does not drink alcohol [name] does not use drugs.  Family History:  Her family history includes Aneurysm in her brother; Brain cancer in her sister; Breast cancer in her mother; Cancer - Lung in her sister; Prostate cancer in her brother; Rheum arthritis in her father.  Assessment/Plan:   COPD with asthma. - reviewed her previous PFT - will have her use Breztri two puffs bid; sample provided - albuterol prn - discussed different roles for her inhalers - will get copy of her previous lab work [name] chest imaging studies from her PCP [name] [facility]  Seasonal allergic rhinitis. - will have her use nasal irrigation [name] flonase on a nightly basis - if  symptoms progress, then could add OTC antihistamine [name]/or montelukast  Atrial fibrillation/flutter, s/p MVR, Chronic diastolic CHF, Tachycardia. - followed by Dr. Carlyle Dolly with [facility]  Time Spent Involved in Patient [name] on Day of Examination:  48 minutes  Follow up:   Patient Instructions  Will have you sign a release form to get copy of your lab tests [name] chest imaging studies from Dr. Marcial Pacas office [name] from [place] two puffs in the morning [name] two puffs in the evening, [name] rinse your mouth after each use  Use saline nasal rinse followed by flonase 1 spray in each nostril nightly  Albuterol inhaler or nebulizer every 6 hours as needed for cough, wheeze, chest congestion, or shortness of breath  Follow up in 3 months  Medication List:   Allergies as of 05/05/2021       Reactions   Iohexol Hives, Shortness Of Breath, Other (See Comments)    Pt. had a severe allergic reaction to IV contrast the last time she was injected [name] had to be seen in the ER.    Hydrocodone-acetaminophen Hives   Nabumetone Rash   Prednisone Rash        Medication List        Accurate as of May 05, 2021 12:18 PM. If you have any questions, ask your nurse or doctor.          acetaminophen 500 MG tablet Commonly known as: TYLENOL Take 1,000 mg by mouth as needed for moderate pain or headache.   albuterol (2.5 MG/3ML) 0.083% nebulizer solution Commonly known as: PROVENTIL Take 2.5 mg by nebulization every 6 (six) hours as needed for wheezing or shortness of breath.   ALPRAZolam 0.5 MG tablet Commonly known as: XANAX Take 0.5 mg by mouth at bedtime.   Breztri Aerosphere 160-9-4.8 MCG/ACT Aero Generic drug: Budeson-Glycopyrrol-Formoterol Inhale 2 puffs into the lungs in the morning [name] at bedtime.   calcium carbonate 600 MG Tabs tablet Commonly known as: OS-CAL Take 600 mg by mouth 2 (two) times daily with a meal.   citalopram 20 MG  tablet Commonly known as: CELEXA Take 20 mg by mouth daily.   Coenzyme Q10 100 MG Tabs Take 100 mg by mouth every evening.   fluticasone 50 MCG/ACT nasal spray Commonly known as: FLONASE Place 1 spray into both nostrils daily. Started by: Chesley Mires, MD   furosemide 20 MG tablet Commonly known as: LASIX Take 40 mg by mouth 2 (two) times daily.   levothyroxine 75 MCG tablet Commonly known as: SYNTHROID TAKE 1/2 TABLET(37.5 MCG) BY MOUTH DAILY BEFORE BREAKFAST   metoprolol tartrate 25 MG tablet Commonly known as: LOPRESSOR Take 0.5 tablets (12.5 mg total) by mouth 2 (two) times daily.   multivitamin tablet Take 1 tablet by mouth daily.   pantoprazole 40 MG tablet Commonly known as: PROTONIX TAKE 1 TABLET(40 MG) BY MOUTH DAILY   potassium chloride 10 MEQ tablet Commonly known as: KLOR-CON Take 10 mEq by mouth every other day.   simvastatin 40 MG tablet Commonly known as: ZOCOR Take 40 mg daily  by mouth.   warfarin 2.5 MG tablet Commonly known as: COUMADIN Take as directed by the anticoagulation clinic. If you are unsure how to take this medication, talk to your nurse or doctor. Original instructions: TAKE 2 TABLETS BY MOUTH EVERY DAY. EXCEPT 1 TABLET ON SUNDAYS OR AS DIRECTED        Signature:  Chesley Mires, MD [place] Pager - [phone] 05/05/2021, 12:18 PM

## 2021-05-22 ENCOUNTER — Ambulatory Visit (INDEPENDENT_AMBULATORY_CARE_PROVIDER_SITE_OTHER): Payer: Medicare Other | Admitting: *Deleted

## 2021-05-22 DIAGNOSIS — Z5181 Encounter for therapeutic drug level monitoring: Secondary | ICD-10-CM

## 2021-05-22 DIAGNOSIS — I4819 Other persistent atrial fibrillation: Secondary | ICD-10-CM | POA: Diagnosis not present

## 2021-05-22 LAB — POCT INR: INR: 2.4 (ref 2.0–3.0)

## 2021-05-22 NOTE — Patient Instructions (Signed)
Continue warfarin 2 tablets daily except for 1 tablet on Sundays. Recheck INR in 4 weeks.

## 2021-05-31 DIAGNOSIS — J449 Chronic obstructive pulmonary disease, unspecified: Secondary | ICD-10-CM | POA: Diagnosis not present

## 2021-05-31 DIAGNOSIS — I10 Essential (primary) hypertension: Secondary | ICD-10-CM | POA: Diagnosis not present

## 2021-05-31 DIAGNOSIS — F419 Anxiety disorder, unspecified: Secondary | ICD-10-CM | POA: Diagnosis not present

## 2021-05-31 DIAGNOSIS — K7031 Alcoholic cirrhosis of liver with ascites: Secondary | ICD-10-CM | POA: Diagnosis not present

## 2021-05-31 DIAGNOSIS — Z6825 Body mass index (BMI) 25.0-25.9, adult: Secondary | ICD-10-CM | POA: Diagnosis not present

## 2021-05-31 DIAGNOSIS — Z299 Encounter for prophylactic measures, unspecified: Secondary | ICD-10-CM | POA: Diagnosis not present

## 2021-05-31 DIAGNOSIS — N183 Chronic kidney disease, stage 3 unspecified: Secondary | ICD-10-CM | POA: Diagnosis not present

## 2021-06-02 DIAGNOSIS — M25561 Pain in right knee: Secondary | ICD-10-CM | POA: Diagnosis not present

## 2021-06-02 DIAGNOSIS — Z6826 Body mass index (BMI) 26.0-26.9, adult: Secondary | ICD-10-CM | POA: Diagnosis not present

## 2021-06-02 DIAGNOSIS — I10 Essential (primary) hypertension: Secondary | ICD-10-CM | POA: Diagnosis not present

## 2021-06-02 DIAGNOSIS — Z299 Encounter for prophylactic measures, unspecified: Secondary | ICD-10-CM | POA: Diagnosis not present

## 2021-06-06 ENCOUNTER — Institutional Professional Consult (permissible substitution): Payer: Medicare Other | Admitting: Internal Medicine

## 2021-06-14 DIAGNOSIS — R109 Unspecified abdominal pain: Secondary | ICD-10-CM | POA: Diagnosis not present

## 2021-06-14 DIAGNOSIS — R142 Eructation: Secondary | ICD-10-CM | POA: Diagnosis not present

## 2021-06-14 DIAGNOSIS — J449 Chronic obstructive pulmonary disease, unspecified: Secondary | ICD-10-CM | POA: Diagnosis not present

## 2021-06-14 DIAGNOSIS — Z79899 Other long term (current) drug therapy: Secondary | ICD-10-CM | POA: Diagnosis not present

## 2021-06-14 DIAGNOSIS — I110 Hypertensive heart disease with heart failure: Secondary | ICD-10-CM | POA: Diagnosis not present

## 2021-06-14 DIAGNOSIS — I5032 Chronic diastolic (congestive) heart failure: Secondary | ICD-10-CM | POA: Diagnosis not present

## 2021-06-14 DIAGNOSIS — Z91041 Radiographic dye allergy status: Secondary | ICD-10-CM | POA: Diagnosis not present

## 2021-06-14 DIAGNOSIS — R140 Abdominal distension (gaseous): Secondary | ICD-10-CM | POA: Diagnosis not present

## 2021-06-16 DIAGNOSIS — I10 Essential (primary) hypertension: Secondary | ICD-10-CM | POA: Diagnosis not present

## 2021-06-16 DIAGNOSIS — Z789 Other specified health status: Secondary | ICD-10-CM | POA: Diagnosis not present

## 2021-06-16 DIAGNOSIS — F32A Depression, unspecified: Secondary | ICD-10-CM | POA: Diagnosis not present

## 2021-06-16 DIAGNOSIS — K219 Gastro-esophageal reflux disease without esophagitis: Secondary | ICD-10-CM | POA: Diagnosis not present

## 2021-06-16 DIAGNOSIS — K7460 Unspecified cirrhosis of liver: Secondary | ICD-10-CM | POA: Diagnosis not present

## 2021-06-16 DIAGNOSIS — Z299 Encounter for prophylactic measures, unspecified: Secondary | ICD-10-CM | POA: Diagnosis not present

## 2021-06-16 DIAGNOSIS — Z6826 Body mass index (BMI) 26.0-26.9, adult: Secondary | ICD-10-CM | POA: Diagnosis not present

## 2021-06-20 ENCOUNTER — Ambulatory Visit (INDEPENDENT_AMBULATORY_CARE_PROVIDER_SITE_OTHER): Payer: Medicare Other | Admitting: *Deleted

## 2021-06-20 DIAGNOSIS — I4819 Other persistent atrial fibrillation: Secondary | ICD-10-CM

## 2021-06-20 DIAGNOSIS — Z5181 Encounter for therapeutic drug level monitoring: Secondary | ICD-10-CM | POA: Diagnosis not present

## 2021-06-20 DIAGNOSIS — H04123 Dry eye syndrome of bilateral lacrimal glands: Secondary | ICD-10-CM | POA: Diagnosis not present

## 2021-06-20 LAB — POCT INR: INR: 1.9 — AB (ref 2.0–3.0)

## 2021-06-20 NOTE — Patient Instructions (Signed)
Take warfarin 3 tablets tonight then resume 2 tablets daily except for 1 tablet on Sundays. Recheck INR in 4 weeks.

## 2021-07-03 DIAGNOSIS — Z20822 Contact with and (suspected) exposure to covid-19: Secondary | ICD-10-CM | POA: Diagnosis not present

## 2021-07-13 DIAGNOSIS — Z299 Encounter for prophylactic measures, unspecified: Secondary | ICD-10-CM | POA: Diagnosis not present

## 2021-07-13 DIAGNOSIS — K219 Gastro-esophageal reflux disease without esophagitis: Secondary | ICD-10-CM | POA: Diagnosis not present

## 2021-07-13 DIAGNOSIS — I10 Essential (primary) hypertension: Secondary | ICD-10-CM | POA: Diagnosis not present

## 2021-07-13 DIAGNOSIS — I5032 Chronic diastolic (congestive) heart failure: Secondary | ICD-10-CM | POA: Diagnosis not present

## 2021-07-13 DIAGNOSIS — J309 Allergic rhinitis, unspecified: Secondary | ICD-10-CM | POA: Diagnosis not present

## 2021-07-18 ENCOUNTER — Ambulatory Visit (INDEPENDENT_AMBULATORY_CARE_PROVIDER_SITE_OTHER): Payer: Medicare Other | Admitting: *Deleted

## 2021-07-18 DIAGNOSIS — Z5181 Encounter for therapeutic drug level monitoring: Secondary | ICD-10-CM | POA: Diagnosis not present

## 2021-07-18 DIAGNOSIS — I4819 Other persistent atrial fibrillation: Secondary | ICD-10-CM | POA: Diagnosis not present

## 2021-07-18 LAB — POCT INR: INR: 3.3 — AB (ref 2.0–3.0)

## 2021-07-18 NOTE — Patient Instructions (Signed)
Take warfarin 1 tablets tonight then resume 2 tablets daily except for 1 tablet on Sundays. Recheck INR in 4 weeks.  ?

## 2021-07-28 ENCOUNTER — Other Ambulatory Visit: Payer: Self-pay | Admitting: Cardiology

## 2021-07-28 NOTE — Telephone Encounter (Signed)
Prescription refill request received for warfarin ?Lov: 04/21/21 ([place]) ?Next INR check: 08/15/21 ?Warfarin tablet strength: 2.'5mg'$  ? ?Appropriate dose and refill sent to requested pharmacy.  ?

## 2021-08-06 DIAGNOSIS — Z91041 Radiographic dye allergy status: Secondary | ICD-10-CM | POA: Diagnosis not present

## 2021-08-06 DIAGNOSIS — I4891 Unspecified atrial fibrillation: Secondary | ICD-10-CM | POA: Diagnosis not present

## 2021-08-06 DIAGNOSIS — Z85038 Personal history of other malignant neoplasm of large intestine: Secondary | ICD-10-CM | POA: Diagnosis not present

## 2021-08-06 DIAGNOSIS — I110 Hypertensive heart disease with heart failure: Secondary | ICD-10-CM | POA: Diagnosis not present

## 2021-08-06 DIAGNOSIS — I4510 Unspecified right bundle-branch block: Secondary | ICD-10-CM | POA: Diagnosis not present

## 2021-08-06 DIAGNOSIS — Z86718 Personal history of other venous thrombosis and embolism: Secondary | ICD-10-CM | POA: Diagnosis not present

## 2021-08-06 DIAGNOSIS — R079 Chest pain, unspecified: Secondary | ICD-10-CM | POA: Diagnosis not present

## 2021-08-06 DIAGNOSIS — E039 Hypothyroidism, unspecified: Secondary | ICD-10-CM | POA: Diagnosis not present

## 2021-08-06 DIAGNOSIS — I5032 Chronic diastolic (congestive) heart failure: Secondary | ICD-10-CM | POA: Diagnosis not present

## 2021-08-06 DIAGNOSIS — R0789 Other chest pain: Secondary | ICD-10-CM | POA: Diagnosis not present

## 2021-08-06 DIAGNOSIS — K449 Diaphragmatic hernia without obstruction or gangrene: Secondary | ICD-10-CM | POA: Diagnosis not present

## 2021-08-06 DIAGNOSIS — J449 Chronic obstructive pulmonary disease, unspecified: Secondary | ICD-10-CM | POA: Diagnosis not present

## 2021-08-06 DIAGNOSIS — Z79899 Other long term (current) drug therapy: Secondary | ICD-10-CM | POA: Diagnosis not present

## 2021-08-06 DIAGNOSIS — E785 Hyperlipidemia, unspecified: Secondary | ICD-10-CM | POA: Diagnosis not present

## 2021-08-06 DIAGNOSIS — Z886 Allergy status to analgesic agent status: Secondary | ICD-10-CM | POA: Diagnosis not present

## 2021-08-06 DIAGNOSIS — Z9851 Tubal ligation status: Secondary | ICD-10-CM | POA: Diagnosis not present

## 2021-08-06 DIAGNOSIS — K219 Gastro-esophageal reflux disease without esophagitis: Secondary | ICD-10-CM | POA: Diagnosis not present

## 2021-08-06 DIAGNOSIS — R9431 Abnormal electrocardiogram [ECG] [EKG]: Secondary | ICD-10-CM | POA: Diagnosis not present

## 2021-08-07 DIAGNOSIS — Z1283 Encounter for screening for malignant neoplasm of skin: Secondary | ICD-10-CM | POA: Diagnosis not present

## 2021-08-07 DIAGNOSIS — D1730 Benign lipomatous neoplasm of skin and subcutaneous tissue of unspecified sites: Secondary | ICD-10-CM | POA: Diagnosis not present

## 2021-08-07 DIAGNOSIS — D239 Other benign neoplasm of skin, unspecified: Secondary | ICD-10-CM | POA: Diagnosis not present

## 2021-08-11 DIAGNOSIS — Z6826 Body mass index (BMI) 26.0-26.9, adult: Secondary | ICD-10-CM | POA: Diagnosis not present

## 2021-08-11 DIAGNOSIS — Z20822 Contact with and (suspected) exposure to covid-19: Secondary | ICD-10-CM | POA: Diagnosis not present

## 2021-08-11 DIAGNOSIS — M1712 Unilateral primary osteoarthritis, left knee: Secondary | ICD-10-CM | POA: Diagnosis not present

## 2021-08-11 DIAGNOSIS — Z299 Encounter for prophylactic measures, unspecified: Secondary | ICD-10-CM | POA: Diagnosis not present

## 2021-08-11 DIAGNOSIS — I10 Essential (primary) hypertension: Secondary | ICD-10-CM | POA: Diagnosis not present

## 2021-08-14 DIAGNOSIS — K219 Gastro-esophageal reflux disease without esophagitis: Secondary | ICD-10-CM | POA: Diagnosis not present

## 2021-08-14 DIAGNOSIS — Z6827 Body mass index (BMI) 27.0-27.9, adult: Secondary | ICD-10-CM | POA: Diagnosis not present

## 2021-08-14 DIAGNOSIS — Z299 Encounter for prophylactic measures, unspecified: Secondary | ICD-10-CM | POA: Diagnosis not present

## 2021-08-14 DIAGNOSIS — I10 Essential (primary) hypertension: Secondary | ICD-10-CM | POA: Diagnosis not present

## 2021-08-14 DIAGNOSIS — I5032 Chronic diastolic (congestive) heart failure: Secondary | ICD-10-CM | POA: Diagnosis not present

## 2021-08-15 ENCOUNTER — Ambulatory Visit (INDEPENDENT_AMBULATORY_CARE_PROVIDER_SITE_OTHER): Payer: Medicare Other | Admitting: *Deleted

## 2021-08-15 DIAGNOSIS — I4819 Other persistent atrial fibrillation: Secondary | ICD-10-CM | POA: Diagnosis not present

## 2021-08-15 DIAGNOSIS — Z5181 Encounter for therapeutic drug level monitoring: Secondary | ICD-10-CM | POA: Diagnosis not present

## 2021-08-15 LAB — POCT INR: INR: 5.6 — AB (ref 2.0–3.0)

## 2021-08-15 NOTE — Patient Instructions (Addendum)
Hold warfarin x 3 days then restart warfarin 2 tablets daily except for 1 tablet on Sundays and Wednesdays ? Recheck INR in 1 week ?Pt denies s/s of bleeding or excessive bruising.  Bleeding and fall precautions discussed with pt and she verbalized understanding. ? ?

## 2021-08-17 DIAGNOSIS — Z299 Encounter for prophylactic measures, unspecified: Secondary | ICD-10-CM | POA: Diagnosis not present

## 2021-08-17 DIAGNOSIS — I10 Essential (primary) hypertension: Secondary | ICD-10-CM | POA: Diagnosis not present

## 2021-08-18 ENCOUNTER — Encounter: Payer: Self-pay | Admitting: Pulmonary Disease

## 2021-08-18 ENCOUNTER — Ambulatory Visit (INDEPENDENT_AMBULATORY_CARE_PROVIDER_SITE_OTHER): Payer: Medicare Other | Admitting: Pulmonary Disease

## 2021-08-18 VITALS — BP 132/70 | HR 68 | Temp 97.7°F | Ht 61.0 in | Wt 144.0 lb

## 2021-08-18 DIAGNOSIS — J45901 Unspecified asthma with (acute) exacerbation: Secondary | ICD-10-CM | POA: Diagnosis not present

## 2021-08-18 DIAGNOSIS — J441 Chronic obstructive pulmonary disease with (acute) exacerbation: Secondary | ICD-10-CM | POA: Diagnosis not present

## 2021-08-18 MED ORDER — METHYLPREDNISOLONE 4 MG PO TABS: ORAL_TABLET | ORAL | 0 refills | Status: AC

## 2021-08-18 MED ORDER — MONTELUKAST SODIUM 10 MG PO TABS: 10.0000 mg | ORAL_TABLET | Freq: Every day | ORAL | 5 refills | Status: DC

## 2021-08-18 MED ORDER — AZELASTINE HCL 0.15 % NA SOLN: 1.0000 | Freq: Every day | NASAL | 3 refills | Status: DC

## 2021-08-18 NOTE — Progress Notes (Signed)
? ?[facility] ? ?Chief Complaint  ?Patient presents with  ? Follow-up  ?  Pt has been wheezing more lately than usual   ? ? ?Past Surgical History:  ?She  has a past surgical history that includes Colonoscopy (02/2008); INGUNAL HERNIA REPAIR (Left); Tubal ligation; Low anterior bowel resection (1999); Appendectomy; Vein ligation and stripping; Esophagogastroduodenoscopy (07/2007); Colonoscopy (N/A, 06/02/2012); Colonoscopy (N/A, 10/13/2015); TEE without cardioversion (N/A, 02/20/2016); Cardiac catheterization (N/A, 04/12/2016); Cataract extraction (Bilateral, 2017); Breast surgery (Right, 2003ish); Multiple extractions with alveoloplasty (N/A, 05/21/2016); Tooth extraction; Mitral valve repair (Right, 07/05/2016); Minimally invasive maze procedure (N/A, 07/05/2016); TEE without cardioversion (N/A, 07/05/2016); Clipping of atrial appendage (07/05/2016); Cardioversion (N/A, 08/16/2017); Colonoscopy with propofol (N/A, 11/20/2018); Esophagogastroduodenoscopy (egd) with propofol (N/A, 11/20/2018); polypectomy (11/20/2018); and Leg skin lesion  biopsy / excision. ? ?Past Medical History:  ?Anxiety, OA, A fib s/p MAZE, Diastolic CHF, Colon polyp, Colon cancer 1999, Depression, Diverticulosis, DM type 2, DVT in leg, Esophageal stricture, GERD, Large Hiatal hernia, Nephrolithiasis, HLD, Mitral regurgitation, Schatzki's ring ? ?Constitutional:  ?BP 132/70 (BP Location: Left Arm, Patient Position: Sitting)   Pulse 68   Temp 97.7 ?F (36.5 ?C) (Temporal)   Ht '5\' 1"'$  (1.549 m)   Wt 144 lb (65.3 kg)   SpO2 97% Comment: ra  BMI 27.21 kg/m?  ? ?Brief Summary:  ?[name] [name] is a 81 y.o. female with COPD with asthma and allergies. ?  ? ? ? ?Subjective:  ? ?For the past week she has more sinus congestion, drainage, and cough.  Feels tight in her chest and feels more short of breath.  Has intermittent wheeze.  Coughing up clear sputum.  Not having skin rash, hemoptysis, fever, leg  swelling, or GI symptoms. ? ?Physical Exam:  ? ?Appearance - well kempt  ? ?ENMT - no sinus tenderness, no oral exudate, no LAN, Mallampati 2 airway, no stridor ? ?Respiratory - equal breath sounds bilaterally, no wheezing or rales ? ?CV - s1s2 regular rate and rhythm, no murmurs ? ?Ext - no clubbing, no edema ? ?Skin - no rashes ? ?Psych - normal mood and affect ? ?  ?Pulmonary testing:  ?PFT 05/11/16 >> FEV1 1.47 (77%), FEV1% 68, TLC 4.84 (101%), DLCO 65%, +BD ? ?Chest imaging:  ?CT chest 12/17/17 >> large hiatal hernia, small effusions, changes of cirrhosis ?CT sinus 12/26/20 >> clear sinuses ? ?Cardiac Tests:  ?Echo 10/26/16 >> EF 60 to 65%, s/p MV valvuloplasty, PASP 43 mmHg ? ?Social History:  ?She  reports that she has never smoked. She has been exposed to tobacco smoke. She has never used smokeless tobacco. She reports that she does not drink alcohol and does not use drugs. ? ?Family History:  ?Her family history includes Aneurysm in her brother; Brain cancer in her sister; Breast cancer in her mother; Cancer - Lung in her sister; Prostate cancer in her brother; Rheum arthritis in her father. ?  ? ? ?Assessment/Plan:  ? ?COPD with asthma. ?- she has acute exacerbation likely from weather and pollen ?- don't think she needs ABx ?- she gets a rash from prednisone; will have her try medrol taper ?- add singulair and astepro; she is not to start these until she tries medrol first and can tolerate this ?- continue breztri ?- prn albuterol ?- she will call if not improving >> would then need additional lab test and imaging studies ? ?Seasonal allergic rhinitis. ?- continue flonase ?- add astepro and singulair ? ?Atrial fibrillation/flutter, s/p  MVR, Chronic diastolic CHF, Tachycardia. ?- followed by Dr. Carlyle Dolly with [facility] ? ?Time Spent Involved in Patient Care on Day of Examination:  ?35 minutes ? ?Follow up:  ? ?Patient Instructions  ?Medrol 4 mg pill >> 3 pills daily for 2 days, 2 pills daily for 2  days, 1 pill daily for 2 days. ? ?Montelukast (singulair) 10 mg pill nightly. ? ?Azelastine (astepro) 1 spray in each nostril daily.  You can increase this to 1 spray in each nostril twice per day as needed. ? ?Follow up in 4 months. ? ?Medication List:  ? ?Allergies as of 08/18/2021   ? ?   Reactions  ? Iohexol Hives, Shortness Of Breath, Other (See Comments)  ?  Pt. had a severe allergic reaction to IV contrast the last time she was injected and had to be seen in the ER.   ? Hydrocodone-acetaminophen Hives  ? Nabumetone Rash  ? Prednisone Rash  ? ?  ? ?  ?Medication List  ?  ? ?  ? Accurate as of August 18, 2021  9:30 AM. If you have any questions, ask your nurse or doctor.  ?  ?  ? ?  ? ?STOP taking these medications   ? ?citalopram 20 MG tablet ?Commonly known as: CELEXA ?Stopped by: Chesley Mires, MD ?  ? ?  ? ?TAKE these medications   ? ?acetaminophen 500 MG tablet ?Commonly known as: TYLENOL ?Take 1,000 mg by mouth as needed for moderate pain or headache. ?  ?albuterol (2.5 MG/3ML) 0.083% nebulizer solution ?Commonly known as: PROVENTIL ?Take 2.5 mg by nebulization every 6 (six) hours as needed for wheezing or shortness of breath. ?  ?ALPRAZolam 0.5 MG tablet ?Commonly known as: [name] ?Take 0.5 mg by mouth at bedtime. ?  ?Azelastine HCl 0.15 % Soln ?Commonly known as: Astepro ?Place 1 spray into the nose daily. ?Started by: Chesley Mires, MD ?  ?Breztri Aerosphere 160-9-4.8 MCG/ACT Aero ?Generic drug: Budeson-Glycopyrrol-Formoterol ?Inhale 2 puffs into the lungs in the morning and at bedtime. ?What changed: Another medication with the same name was removed. Continue taking this medication, and follow the directions you see here. ?Changed by: Chesley Mires, MD ?  ?calcium carbonate 600 MG Tabs tablet ?Commonly known as: OS-CAL ?Take 600 mg by mouth 2 (two) times daily with a meal. ?  ?Coenzyme Q10 100 MG Tabs ?Take 100 mg by mouth every evening. ?  ?FLUoxetine 20 MG capsule ?Commonly known as: PROZAC ?Take 20 mg by  mouth daily. ?  ?fluticasone 50 MCG/ACT nasal spray ?Commonly known as: FLONASE ?Place 1 spray into both nostrils daily. ?  ?furosemide 20 MG tablet ?Commonly known as: LASIX ?Take 40 mg by mouth 2 (two) times daily. ?  ?levothyroxine 75 MCG tablet ?Commonly known as: SYNTHROID ?TAKE 1/2 TABLET(37.5 MCG) BY MOUTH DAILY BEFORE BREAKFAST ?  ?methylPREDNISolone 4 MG tablet ?Commonly known as: Medrol ?Take 3 tablets (12 mg total) by mouth daily for 2 days, THEN 2 tablets (8 mg total) daily for 2 days, THEN 1 tablet (4 mg total) daily for 2 days. ?Start taking on: August 18, 2021 ?Started by: Chesley Mires, MD ?  ?metoprolol tartrate 25 MG tablet ?Commonly known as: LOPRESSOR ?Take 0.5 tablets (12.5 mg total) by mouth 2 (two) times daily. ?  ?montelukast 10 MG tablet ?Commonly known as: SINGULAIR ?Take 1 tablet (10 mg total) by mouth at bedtime. ?Started by: Chesley Mires, MD ?  ?multivitamin tablet ?Take 1 tablet by mouth daily. ?  ?  pantoprazole 40 MG tablet ?Commonly known as: PROTONIX ?TAKE 1 TABLET(40 MG) BY MOUTH DAILY ?  ?potassium chloride 10 MEQ tablet ?Commonly known as: KLOR-CON ?Take 10 mEq by mouth every other day. ?  ?simvastatin 40 MG tablet ?Commonly known as: ZOCOR ?Take 40 mg daily by mouth. ?  ?warfarin 2.5 MG tablet ?Commonly known as: COUMADIN ?Take as directed by the anticoagulation clinic. If you are unsure how to take this medication, talk to your nurse or doctor. ?Original instructions: TAKE 2 TABLETS BY MOUTH EVERY DAY. EXCEPT 1 TABLET ON SUNDAYS OR AS DIRECTED ?  ? ?  ? ? ?Signature:  ?Chesley Mires, MD ?[place] ?Pager - [phone] ?08/18/2021, 9:30 AM ?  ? ? ? ? ? ? ? ? ?

## 2021-08-18 NOTE — Patient Instructions (Signed)
Medrol 4 mg pill >> 3 pills daily for 2 days, 2 pills daily for 2 days, 1 pill daily for 2 days. ? ?Montelukast (singulair) 10 mg pill nightly. ? ?Azelastine (astepro) 1 spray in each nostril daily.  You can increase this to 1 spray in each nostril twice per day as needed. ? ?Follow up in 4 months. ?

## 2021-08-19 DIAGNOSIS — Z20822 Contact with and (suspected) exposure to covid-19: Secondary | ICD-10-CM | POA: Diagnosis not present

## 2021-08-22 DIAGNOSIS — Z20822 Contact with and (suspected) exposure to covid-19: Secondary | ICD-10-CM | POA: Diagnosis not present

## 2021-08-23 ENCOUNTER — Ambulatory Visit (INDEPENDENT_AMBULATORY_CARE_PROVIDER_SITE_OTHER): Payer: Medicare Other | Admitting: *Deleted

## 2021-08-23 DIAGNOSIS — Z5181 Encounter for therapeutic drug level monitoring: Secondary | ICD-10-CM

## 2021-08-23 DIAGNOSIS — I4819 Other persistent atrial fibrillation: Secondary | ICD-10-CM

## 2021-08-23 LAB — POCT INR: INR: 1.8 — AB (ref 2.0–3.0)

## 2021-08-23 NOTE — Patient Instructions (Signed)
Description   ?Take 1.5 tablets of warfarin today and then continue to take warfarin 2 tablets daily except for 1 tablet on Sundays and Wednesdays. Recheck INR in 1 week.  ?  ? ? ?

## 2021-08-28 DIAGNOSIS — I10 Essential (primary) hypertension: Secondary | ICD-10-CM | POA: Diagnosis not present

## 2021-08-28 DIAGNOSIS — F418 Other specified anxiety disorders: Secondary | ICD-10-CM | POA: Diagnosis not present

## 2021-08-28 DIAGNOSIS — Z6827 Body mass index (BMI) 27.0-27.9, adult: Secondary | ICD-10-CM | POA: Diagnosis not present

## 2021-08-28 DIAGNOSIS — Z789 Other specified health status: Secondary | ICD-10-CM | POA: Diagnosis not present

## 2021-08-28 DIAGNOSIS — Z299 Encounter for prophylactic measures, unspecified: Secondary | ICD-10-CM | POA: Diagnosis not present

## 2021-08-28 DIAGNOSIS — M170 Bilateral primary osteoarthritis of knee: Secondary | ICD-10-CM | POA: Diagnosis not present

## 2021-08-28 DIAGNOSIS — K641 Second degree hemorrhoids: Secondary | ICD-10-CM | POA: Diagnosis not present

## 2021-08-30 ENCOUNTER — Ambulatory Visit (INDEPENDENT_AMBULATORY_CARE_PROVIDER_SITE_OTHER): Payer: Medicare Other | Admitting: *Deleted

## 2021-08-30 DIAGNOSIS — Z5181 Encounter for therapeutic drug level monitoring: Secondary | ICD-10-CM

## 2021-08-30 DIAGNOSIS — I4819 Other persistent atrial fibrillation: Secondary | ICD-10-CM

## 2021-08-30 LAB — POCT INR: INR: 3 (ref 2.0–3.0)

## 2021-08-30 NOTE — Patient Instructions (Signed)
Continue warfarin 2 tablets daily except for 1 tablet on Sundays and Wednesdays. Recheck INR in 3 weeks. ?

## 2021-09-13 DIAGNOSIS — R0781 Pleurodynia: Secondary | ICD-10-CM | POA: Diagnosis not present

## 2021-09-13 DIAGNOSIS — Z299 Encounter for prophylactic measures, unspecified: Secondary | ICD-10-CM | POA: Diagnosis not present

## 2021-09-13 DIAGNOSIS — I4891 Unspecified atrial fibrillation: Secondary | ICD-10-CM | POA: Diagnosis not present

## 2021-09-13 DIAGNOSIS — Z789 Other specified health status: Secondary | ICD-10-CM | POA: Diagnosis not present

## 2021-09-13 DIAGNOSIS — I517 Cardiomegaly: Secondary | ICD-10-CM | POA: Diagnosis not present

## 2021-09-13 DIAGNOSIS — S2001XA Contusion of right breast, initial encounter: Secondary | ICD-10-CM | POA: Diagnosis not present

## 2021-09-13 DIAGNOSIS — M419 Scoliosis, unspecified: Secondary | ICD-10-CM | POA: Diagnosis not present

## 2021-09-13 DIAGNOSIS — Z9181 History of falling: Secondary | ICD-10-CM | POA: Diagnosis not present

## 2021-09-13 DIAGNOSIS — F332 Major depressive disorder, recurrent severe without psychotic features: Secondary | ICD-10-CM | POA: Diagnosis not present

## 2021-09-13 DIAGNOSIS — I700 Atherosclerosis of aorta: Secondary | ICD-10-CM | POA: Diagnosis not present

## 2021-09-13 DIAGNOSIS — K449 Diaphragmatic hernia without obstruction or gangrene: Secondary | ICD-10-CM | POA: Diagnosis not present

## 2021-09-14 ENCOUNTER — Encounter: Payer: Self-pay | Admitting: Internal Medicine

## 2021-09-20 ENCOUNTER — Ambulatory Visit (INDEPENDENT_AMBULATORY_CARE_PROVIDER_SITE_OTHER): Payer: Medicare Other | Admitting: *Deleted

## 2021-09-20 ENCOUNTER — Ambulatory Visit: Payer: Medicare Other | Admitting: Nurse Practitioner

## 2021-09-20 DIAGNOSIS — Z5181 Encounter for therapeutic drug level monitoring: Secondary | ICD-10-CM | POA: Diagnosis not present

## 2021-09-20 DIAGNOSIS — I4819 Other persistent atrial fibrillation: Secondary | ICD-10-CM

## 2021-09-20 LAB — POCT INR: INR: 2.4 (ref 2.0–3.0)

## 2021-09-20 NOTE — Patient Instructions (Signed)
Continue warfarin 2 tablets daily except for 1 tablet on Sundays and Wednesdays. Recheck INR in 4 weeks.

## 2021-09-25 ENCOUNTER — Ambulatory Visit: Payer: Medicare Other | Admitting: Pulmonary Disease

## 2021-10-09 ENCOUNTER — Ambulatory Visit: Payer: Medicare Other | Admitting: Nurse Practitioner

## 2021-10-10 DIAGNOSIS — M25512 Pain in left shoulder: Secondary | ICD-10-CM | POA: Diagnosis not present

## 2021-10-10 DIAGNOSIS — Z789 Other specified health status: Secondary | ICD-10-CM | POA: Diagnosis not present

## 2021-10-10 DIAGNOSIS — I10 Essential (primary) hypertension: Secondary | ICD-10-CM | POA: Diagnosis not present

## 2021-10-10 DIAGNOSIS — Z299 Encounter for prophylactic measures, unspecified: Secondary | ICD-10-CM | POA: Diagnosis not present

## 2021-10-17 ENCOUNTER — Ambulatory Visit (INDEPENDENT_AMBULATORY_CARE_PROVIDER_SITE_OTHER): Payer: Medicare Other | Admitting: Internal Medicine

## 2021-10-17 ENCOUNTER — Encounter: Payer: Self-pay | Admitting: Internal Medicine

## 2021-10-17 ENCOUNTER — Other Ambulatory Visit (HOSPITAL_COMMUNITY)
Admission: RE | Admit: 2021-10-17 | Discharge: 2021-10-17 | Disposition: A | Discharge status: Home / Self Care | Payer: Medicare Other | Source: Ambulatory Visit | Attending: Nurse Practitioner | Admitting: Nurse Practitioner

## 2021-10-17 VITALS — BP 147/77 | HR 52 | Temp 97.7°F | Ht 61.0 in | Wt 144.8 lb

## 2021-10-17 DIAGNOSIS — C189 Malignant neoplasm of colon, unspecified: Secondary | ICD-10-CM

## 2021-10-17 DIAGNOSIS — K625 Hemorrhage of anus and rectum: Secondary | ICD-10-CM

## 2021-10-17 DIAGNOSIS — E032 Hypothyroidism due to medicaments and other exogenous substances: Secondary | ICD-10-CM | POA: Insufficient documentation

## 2021-10-17 DIAGNOSIS — K5900 Constipation, unspecified: Secondary | ICD-10-CM

## 2021-10-17 LAB — T4, FREE: Free T4: 1.31 ng/dL — ABNORMAL HIGH (ref 0.61–1.12)

## 2021-10-17 LAB — TSH: TSH: 2.334 u[IU]/mL (ref 0.350–4.500)

## 2021-10-18 ENCOUNTER — Ambulatory Visit (INDEPENDENT_AMBULATORY_CARE_PROVIDER_SITE_OTHER): Payer: Medicare Other | Admitting: *Deleted

## 2021-10-18 DIAGNOSIS — I4819 Other persistent atrial fibrillation: Secondary | ICD-10-CM | POA: Diagnosis not present

## 2021-10-18 DIAGNOSIS — Z5181 Encounter for therapeutic drug level monitoring: Secondary | ICD-10-CM

## 2021-10-18 DIAGNOSIS — M1711 Unilateral primary osteoarthritis, right knee: Secondary | ICD-10-CM | POA: Diagnosis not present

## 2021-10-18 LAB — POCT INR: INR: 2.6 (ref 2.0–3.0)

## 2021-10-18 NOTE — Patient Instructions (Signed)
Continue warfarin 2 tablets daily except for 1 tablet on Sundays and Wednesdays. Recheck INR in 6 weeks. 

## 2021-10-20 DIAGNOSIS — M1712 Unilateral primary osteoarthritis, left knee: Secondary | ICD-10-CM | POA: Diagnosis not present

## 2021-10-25 DIAGNOSIS — M1711 Unilateral primary osteoarthritis, right knee: Secondary | ICD-10-CM | POA: Diagnosis not present

## 2021-10-27 DIAGNOSIS — M1712 Unilateral primary osteoarthritis, left knee: Secondary | ICD-10-CM | POA: Diagnosis not present

## 2021-10-28 ENCOUNTER — Other Ambulatory Visit: Payer: Self-pay | Admitting: Cardiology

## 2021-10-30 ENCOUNTER — Encounter: Payer: Self-pay | Admitting: Cardiology

## 2021-10-30 ENCOUNTER — Ambulatory Visit (INDEPENDENT_AMBULATORY_CARE_PROVIDER_SITE_OTHER): Payer: Medicare Other | Admitting: Cardiology

## 2021-10-30 VITALS — BP 120/78 | HR 104 | Ht 61.0 in | Wt 132.6 lb

## 2021-10-30 DIAGNOSIS — Z9889 Other specified postprocedural states: Secondary | ICD-10-CM

## 2021-10-30 DIAGNOSIS — I5032 Chronic diastolic (congestive) heart failure: Secondary | ICD-10-CM | POA: Diagnosis not present

## 2021-10-30 DIAGNOSIS — I4819 Other persistent atrial fibrillation: Secondary | ICD-10-CM

## 2021-10-30 DIAGNOSIS — D6869 Other thrombophilia: Secondary | ICD-10-CM | POA: Diagnosis not present

## 2021-10-30 MED ORDER — POTASSIUM CHLORIDE CRYS ER 20 MEQ PO TBCR: 20.0000 meq | EXTENDED_RELEASE_TABLET | ORAL | 2 refills | Status: DC | PRN

## 2021-10-30 MED ORDER — FUROSEMIDE 40 MG PO TABS: 40.0000 mg | ORAL_TABLET | ORAL | Status: DC | PRN

## 2021-10-30 NOTE — Patient Instructions (Signed)
Medication Instructions:  Change your Lasix to '40mg'$  as needed for swelling  Begin Potassium 64mq as needed (on days you take Lasix) Continue all other medications.     Labwork: none  Testing/Procedures: none  Follow-Up: 6 months   Any Other Special Instructions Will Be Listed Below (If Applicable).   If you need a refill on your cardiac medications before your next appointment, please call your pharmacy.

## 2021-10-30 NOTE — Telephone Encounter (Signed)
Received refill request for warfarin:  Last INR was 2.6 on 10/18/21 Next INR due on 11/29/21 LOV 06/20/21  Myles Gip MD  Refill approved.

## 2021-10-30 NOTE — Progress Notes (Signed)
Clinical Summary [name] [name] is a 81 y.o.femaleseen today for follow up of the following medical problem.s      1. Paroxysmal afib/ Aflutter - s/p MAZE procedure during MV repair - management complicated by sinus node dysfunction. Has affected ability to take av nodal agents. Significant brady on amio, now off.    - 03/2019 event monitor with just SR, rare PACs   - no recent palpitations.  - intermittent hemorroid bleeding few weeks ago that has resolved, remains on coumadin    2 Sinus node dysfunction - followed by EP, watchful waiting. Likely will require ppm at some point - denies recent symptoms       3. Mitral regurgitation, s/p MV repair - echo at last [facility] Jan 2017 showed normal LVEF, 3+(modarte) eccentric MR with immobile posterior leaflet.  - repeat echo 01/2016 moderate to severe MR. LVEF 60-65%. LVIDs 31. Symptoms unchanged since last visit. - 01/2016 TEE moderate to severe eccentric MR, severe TR.  - she is s/p mitral valve repair 07/05/16, Sorin Memo 3D Ring Annuloplasty (size 70m, catalog #[id number], serial #[id number]  - repeat echo 10/2016 with LVEF 60-65%, no WMAs, normal MV repair and ring, moderate TR.       - SOB up and down, varies on weather. Compliant with inhalers, occasional wheezing - occasional LE edema, improves with prn lasix   4. Chronic diastolic HF - takes lasix just prn, usually about 3 times a week    5. History of GI bleed - followed by GI - notes indicate prior hemorroidal bleeding       6. COPD - followed by Dr SHalford Chessman      7. Venous insufficiency - followed by vascular       Past Medical History:  Diagnosis Date   Anxiety    Arthritis    Asthma    Atrial fibrillation, persistent ([name])    Chronic diastolic congestive heart failure ([name])    Colon adenoma    Colon cancer ([facility] 1999   status post low anterior resection, limited stage disease requiring no adjuvant therapy   COPD (chronic obstructive pulmonary disease)  ([name])    Depression    Diverticulosis    DM (dermatomyositis)    DVT (deep venous thrombosis) ([name])    in leg- long time ago   Dyspnea    with activity   Dysrhythmia    Esophageal dysphagia    GERD (gastroesophageal reflux disease)    Headache    Heart murmur    Hematuria    Hemorrhoids    Hiatal hernia    History of kidney stones    x 2   Hypercholesterolemia    Incidental pulmonary nodule 05/08/2016   8 mm vague opacity RML noted on CT scan   Mitral regurgitation    PONV (postoperative nausea and vomiting) 2003 ish    with breast biopsy   S/P minimally invasive maze operation for atrial fibrillation 07/05/2016   Complete bilateral atrial lesion set using cryothermy and bipolar radiofrequency ablation with clipping of LA appendage via right mini thoracotomy approach   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/05/2016   Complex valvuloplasty including artificial Gore-tex neochord placement x6 and 30 mm Sorin Memo 3D ring annuloplasty via right mini thoracotomy approach   Schatzki's ring    Tricuspid regurgitation      Allergies  Allergen Reactions   Iohexol Hives, Shortness Of Breath and Other (See Comments)     Pt. had a severe allergic  reaction to IV contrast the last time she was injected and had to be seen in the ER.    Hydrocodone-Acetaminophen Hives   Nabumetone Rash   Prednisone Rash     Current Outpatient Medications  Medication Sig Dispense Refill   acetaminophen (TYLENOL) 500 MG tablet Take 1,000 mg by mouth as needed for moderate pain or headache.     albuterol (PROVENTIL) (2.5 MG/3ML) 0.083% nebulizer solution Take 2.5 mg by nebulization every 6 (six) hours as needed for wheezing or shortness of breath.     ALPRAZolam (XANAX) 0.5 MG tablet Take 0.5 mg by mouth at bedtime.     Azelastine HCl (ASTEPRO) 0.15 % SOLN Place 1 spray into the nose daily. 30 mL 3   Budeson-Glycopyrrol-Formoterol (BREZTRI AEROSPHERE) 160-9-4.8 MCG/ACT AERO Inhale 2 puffs into  the lungs in the morning and at bedtime. 10.7 g 0   calcium carbonate (OS-CAL) 600 MG TABS tablet Take 600 mg by mouth 2 (two) times daily with a meal.     Coenzyme Q10 100 MG TABS Take 100 mg by mouth every evening.      FLUoxetine (PROZAC) 20 MG capsule Take 20 mg by mouth daily.     fluticasone (FLONASE) 50 MCG/ACT nasal spray Place 1 spray into both nostrils daily. 16 g 5   furosemide (LASIX) 20 MG tablet Take 40 mg by mouth 2 (two) times daily.     levothyroxine (SYNTHROID) 75 MCG tablet TAKE 1/2 TABLET(37.5 MCG) BY MOUTH DAILY BEFORE BREAKFAST 45 tablet 0   metoprolol tartrate (LOPRESSOR) 25 MG tablet Take 0.5 tablets (12.5 mg total) by mouth 2 (two) times daily. 30 tablet 11   montelukast (SINGULAIR) 10 MG tablet Take 1 tablet (10 mg total) by mouth at bedtime. 30 tablet 5   Multiple Vitamin (MULTIVITAMIN) tablet Take 1 tablet by mouth daily.     pantoprazole (PROTONIX) 40 MG tablet TAKE 1 TABLET(40 MG) BY MOUTH DAILY 90 tablet 3   simvastatin (ZOCOR) 40 MG tablet Take 40 mg daily by mouth.     warfarin (COUMADIN) 2.5 MG tablet TAKE 2 TABLETS BY MOUTH EVERY DAY. EXCEPT 1 TABLET ON SUNDAYS OR AS DIRECTED 185 tablet 0   No current facility-administered medications for this visit.     Past Surgical History:  Procedure Laterality Date   APPENDECTOMY     BREAST SURGERY Right 2003ish   biopsy   CARDIAC CATHETERIZATION N/A 04/12/2016   Procedure: Right/Left Heart Cath and Coronary Angiography;  Surgeon: Leonie Man, MD;  Location: [facility];  Service: Cardiovascular;  Laterality: N/A;   CARDIOVERSION N/A 08/16/2017   Procedure: CARDIOVERSION;  Surgeon: Arnoldo Lenis, MD;  Location: [name];  Service: Endoscopy;  Laterality: N/A;   CATARACT EXTRACTION Bilateral 2017   CLIPPING OF ATRIAL APPENDAGE  07/05/2016   Procedure: CLIPPING OF ATRIAL APPENDAGE;  Surgeon: Rexene Alberts, MD;  Location: [facility];  Service: Open Heart Surgery;;   COLONOSCOPY  02/2008   Dr. Gala Romney-  external hemorrhoidal tags o/w normal rectal mucosa, s/p surgical resection with a normal appearing anastomosis 12cm, pan colonic diverticulum   COLONOSCOPY N/A 06/02/2012   [id number] post low anterior resection. Pancolonic diverticulosis. Colonic polyp-tubular adenoma. Surveillance due 2019.    COLONOSCOPY N/A 10/13/2015   Procedure: COLONOSCOPY;  Surgeon: Daneil Dolin, MD;  Location: [name];  Service: Endoscopy;  Laterality: N/A;  0930   COLONOSCOPY WITH PROPOFOL N/A 11/20/2018   Procedure: COLONOSCOPY WITH PROPOFOL;  Surgeon: Daneil Dolin, MD;  Location: [name];  Service: Endoscopy;  Laterality: N/A;  2:30pm   ESOPHAGOGASTRODUODENOSCOPY  07/2007   Dr. Daiva Nakayama cervical esophageal web, schatzki ring, large hiatal hernia   ESOPHAGOGASTRODUODENOSCOPY (EGD) WITH PROPOFOL N/A 11/20/2018   Procedure: ESOPHAGOGASTRODUODENOSCOPY (EGD) WITH PROPOFOL;  Surgeon: Daneil Dolin, MD;  Location: [name];  Service: Endoscopy;  Laterality: N/A;   INGUNAL HERNIA REPAIR Left    LEG SKIN LESION  BIOPSY / EXCISION     LOW ANTERIOR BOWEL RESECTION  1999   NO ADJ CHEMO   MINIMALLY INVASIVE MAZE PROCEDURE N/A 07/05/2016   Procedure: MINIMALLY INVASIVE MAZE PROCEDURE;  Surgeon: Rexene Alberts, MD;  Location: [facility];  Service: Open Heart Surgery;  Laterality: N/A;   MITRAL VALVE REPAIR Right 07/05/2016   Procedure: MINIMALLY INVASIVE MITRAL VALVE REPAIR (MVR);  Surgeon: Rexene Alberts, MD;  Location: [facility];  Service: Open Heart Surgery;  Laterality: Right;   MULTIPLE EXTRACTIONS WITH ALVEOLOPLASTY N/A 05/21/2016   Procedure: MULTIPLE EXTRACTION OF TOOTH #'S 5, 21 WITH ALVEOLOPLASTY AND GROSS DEBRIDEMENT OF TEETH;  Surgeon: Lenn Cal, DDS;  Location: [facility];  Service: Oral Surgery;  Laterality: N/A;   POLYPECTOMY  11/20/2018   Procedure: POLYPECTOMY;  Surgeon: Daneil Dolin, MD;  [name];  Service: Endoscopy;;  colon   TEE WITHOUT CARDIOVERSION N/A  02/20/2016   Procedure: TRANSESOPHAGEAL ECHOCARDIOGRAM (TEE);  Surgeon: Arnoldo Lenis, MD;  [name];  Service: Endoscopy;  Laterality: N/A;   TEE WITHOUT CARDIOVERSION N/A 07/05/2016   Procedure: TRANSESOPHAGEAL ECHOCARDIOGRAM (TEE);  Surgeon: Rexene Alberts, MD;  Location: [facility];  Service: Open Heart Surgery;  Laterality: N/A;   TOOTH EXTRACTION     TUBAL LIGATION     VEIN LIGATION AND STRIPPING       Allergies  Allergen Reactions   Iohexol Hives, Shortness Of Breath and Other (See Comments)     Pt. had a severe allergic reaction to IV contrast the last time she was injected and had to be seen in the ER.    Hydrocodone-Acetaminophen Hives   Nabumetone Rash   Prednisone Rash      Family History  Problem Relation Age of Onset   Breast cancer Mother    Rheum arthritis Father    Cancer - Lung Sister    Brain cancer Sister    Prostate cancer Brother    Aneurysm Brother    Colon cancer Neg Hx      Social History [name] [name] reports that she has never smoked. She has been exposed to tobacco smoke. She has never used smokeless tobacco. [name] [name] reports no history of alcohol use.   Review of Systems CONSTITUTIONAL: No weight loss, fever, chills, weakness or fatigue.  HEENT: Eyes: No visual loss, blurred vision, double vision or yellow sclerae.No hearing loss, sneezing, congestion, runny nose or sore throat.  SKIN: No rash or itching.  CARDIOVASCULAR: per hpi RESPIRATORY: No shortness of breath, cough or sputum.  GASTROINTESTINAL: No anorexia, nausea, vomiting or diarrhea. No abdominal pain or blood.  GENITOURINARY: No burning on urination, no polyuria NEUROLOGICAL: No headache, dizziness, syncope, paralysis, ataxia, numbness or tingling in the extremities. No change in bowel or bladder control.  MUSCULOSKELETAL: No muscle, back pain, joint pain or stiffness.  LYMPHATICS: No enlarged nodes. No history of splenectomy.  PSYCHIATRIC: No history of  depression or anxiety.  ENDOCRINOLOGIC: No reports of sweating, cold or heat intolerance. No polyuria or polydipsia.  Marland Kitchen   Physical  Examination Today's Vitals   10/30/21 1012  BP: 120/78  Pulse: (!) 104  SpO2: 96%  Weight: 132 lb 9.6 oz (60.1 kg)  Height: '5\' 1"'$  (1.549 m)   Body mass index is 25.05 kg/m.  Gen: resting comfortably, no acute distress HEENT: no scleral icterus, pupils equal round and reactive, no palptable cervical adenopathy,  CV: RRR, no m/rg no jvd Resp: Clear to auscultation bilaterally GI: abdomen is soft, non-tender, non-distended, normal bowel sounds, no hepatosplenomegaly MSK: extremities are warm, trace bilateral edema Skin: warm, no rash Neuro:  no focal deficits Psych: appropriate affect   Diagnostic Studies     Assessment and Plan   1. AFib/Aflutter/acquired thrombophlia - management limited due to sinus node dysfunction, prior bradycardia on amio. May need pacemaker at some point, followed by ep  - continues to do reasonably well with just low dose lopressor, continue current meds - continue coumadin for stroke prevention     2. Mitral regurgitation/Mitral valve repair - s/p mitral valve repair - no issues, continue to monitor       3. Chronic diastolic HF - continue prn lasix, overall controlled        Arnoldo Lenis, M.D.

## 2021-11-01 DIAGNOSIS — M1711 Unilateral primary osteoarthritis, right knee: Secondary | ICD-10-CM | POA: Diagnosis not present

## 2021-11-01 NOTE — Patient Instructions (Signed)

## 2021-11-02 ENCOUNTER — Ambulatory Visit (INDEPENDENT_AMBULATORY_CARE_PROVIDER_SITE_OTHER): Payer: Medicare Other | Admitting: Nurse Practitioner

## 2021-11-02 ENCOUNTER — Encounter: Payer: Self-pay | Admitting: Nurse Practitioner

## 2021-11-02 VITALS — BP 133/82 | HR 106 | Ht 61.0 in | Wt 131.0 lb

## 2021-11-02 DIAGNOSIS — E032 Hypothyroidism due to medicaments and other exogenous substances: Secondary | ICD-10-CM | POA: Diagnosis not present

## 2021-11-02 MED ORDER — LEVOTHYROXINE SODIUM 75 MCG PO TABS: ORAL_TABLET | ORAL | 0 refills | Status: DC

## 2021-11-02 NOTE — Progress Notes (Signed)
11/02/2021, 2:25 PM                 Endocrinology follow-up   Subjective:    Patient ID: [name] [name], female    DOB: [birth year], PCP Ralph Leyden, FNP   Past Medical History:  Diagnosis Date   Anxiety    Arthritis    Asthma    Atrial fibrillation, persistent ([place])    Chronic diastolic congestive heart failure ([place])    Colon adenoma    Colon cancer ([place]) 1999   status post low anterior resection, limited stage disease requiring no adjuvant therapy   COPD (chronic obstructive pulmonary disease) ([name])    Depression    Diverticulosis    DM (dermatomyositis)    DVT (deep venous thrombosis) ([name])    in leg- long time ago   Dyspnea    with activity   Dysrhythmia    Esophageal dysphagia    GERD (gastroesophageal reflux disease)    Headache    Heart murmur    Hematuria    Hemorrhoids    Hiatal hernia    History of kidney stones    x 2   Hypercholesterolemia    Incidental pulmonary nodule 05/08/2016   8 mm vague opacity RML noted on CT scan   Mitral regurgitation    PONV (postoperative nausea and vomiting) 2003 ish    with breast biopsy   S/P minimally invasive maze operation for atrial fibrillation 07/05/2016   Complete bilateral atrial lesion set using cryothermy and bipolar radiofrequency ablation with clipping of LA appendage via right mini thoracotomy approach   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/05/2016   Complex valvuloplasty including artificial Gore-tex neochord placement x6 and 30 mm Sorin Memo 3D ring annuloplasty via right mini thoracotomy approach   Schatzki's ring    Tricuspid regurgitation    Past Surgical History:  Procedure Laterality Date   APPENDECTOMY     BREAST SURGERY Right 2003ish   biopsy   CARDIAC CATHETERIZATION N/A 04/12/2016   Procedure: Right/Left Heart Cath and Coronary Angiography;  Surgeon: Leonie Man, MD;  Location: [facility];  Service:  Cardiovascular;  Laterality: N/A;   CARDIOVERSION N/A 08/16/2017   Procedure: CARDIOVERSION;  Surgeon: Arnoldo Lenis, MD;  Location: [name];  Service: Endoscopy;  Laterality: N/A;   CATARACT EXTRACTION Bilateral 2017   CLIPPING OF ATRIAL APPENDAGE  07/05/2016   Procedure: CLIPPING OF ATRIAL APPENDAGE;  Surgeon: Rexene Alberts, MD;  Location: [facility];  Service: Open Heart Surgery;;   COLONOSCOPY  02/2008   Dr. Gala Romney- external hemorrhoidal tags o/w normal rectal mucosa, s/p surgical resection with a normal appearing anastomosis 12cm, pan colonic diverticulum   COLONOSCOPY N/A 06/02/2012   [id number] post low anterior resection. Pancolonic diverticulosis. Colonic polyp-tubular adenoma. Surveillance due 2019.    COLONOSCOPY N/A 10/13/2015   Procedure: COLONOSCOPY;  Surgeon: Daneil Dolin, MD;  [name];  Service: Endoscopy;  Laterality: N/A;  0930   COLONOSCOPY WITH PROPOFOL N/A 11/20/2018   Procedure: COLONOSCOPY WITH PROPOFOL;  Surgeon: Daneil Dolin, MD;  [name];  Service: Endoscopy;  Laterality: N/A;  2:30pm   ESOPHAGOGASTRODUODENOSCOPY  07/2007   Dr. Daiva Nakayama cervical esophageal web, schatzki  ring, large hiatal hernia   ESOPHAGOGASTRODUODENOSCOPY (EGD) WITH PROPOFOL N/A 11/20/2018   Procedure: ESOPHAGOGASTRODUODENOSCOPY (EGD) WITH PROPOFOL;  Surgeon: Daneil Dolin, MD;  [name];  Service: Endoscopy;  Laterality: N/A;   INGUNAL HERNIA REPAIR Left    LEG SKIN LESION  BIOPSY / EXCISION     LOW ANTERIOR BOWEL RESECTION  1999   NO ADJ CHEMO   MINIMALLY INVASIVE MAZE PROCEDURE N/A 07/05/2016   Procedure: MINIMALLY INVASIVE MAZE PROCEDURE;  Surgeon: Rexene Alberts, MD;  Location: [facility];  Service: Open Heart Surgery;  Laterality: N/A;   MITRAL VALVE REPAIR Right 07/05/2016   Procedure: MINIMALLY INVASIVE MITRAL VALVE REPAIR (MVR);  Surgeon: Rexene Alberts, MD;  Location: [facility];  Service: Open Heart Surgery;  Laterality: Right;    MULTIPLE EXTRACTIONS WITH ALVEOLOPLASTY N/A 05/21/2016   Procedure: MULTIPLE EXTRACTION OF TOOTH #'S 5, 21 WITH ALVEOLOPLASTY AND GROSS DEBRIDEMENT OF TEETH;  Surgeon: Lenn Cal, DDS;  Location: [facility];  Service: Oral Surgery;  Laterality: N/A;   POLYPECTOMY  11/20/2018   Procedure: POLYPECTOMY;  Surgeon: Daneil Dolin, MD;  [name];  Service: Endoscopy;;  colon   TEE WITHOUT CARDIOVERSION N/A 02/20/2016   Procedure: TRANSESOPHAGEAL ECHOCARDIOGRAM (TEE);  Surgeon: Arnoldo Lenis, MD;  [name];  Service: Endoscopy;  Laterality: N/A;   TEE WITHOUT CARDIOVERSION N/A 07/05/2016   Procedure: TRANSESOPHAGEAL ECHOCARDIOGRAM (TEE);  Surgeon: Rexene Alberts, MD;  Location: [facility];  Service: Open Heart Surgery;  Laterality: N/A;   TOOTH EXTRACTION     TUBAL LIGATION     VEIN LIGATION AND STRIPPING     Social History   Socioeconomic History   Marital status: Married    Spouse name: Not on file   Number of children: 2   Years of education: Not on file   Highest education level: Not on file  Occupational History   Not on file  Tobacco Use   Smoking status: Never    Passive exposure: Past   Smokeless tobacco: Never  Vaping Use   Vaping Use: Never used  Substance and Sexual Activity   Alcohol use: No    Alcohol/week: 0.0 standard drinks of alcohol   Drug use: No   Sexual activity: Not Currently  Other Topics Concern   Not on file  Social History Narrative   Not on file   Social Determinants of Health   Financial Resource Strain: Not on file  Food Insecurity: Not on file  Transportation Needs: Not on file  Physical Activity: Not on file  Stress: Not on file  Social Connections: Not on file   Outpatient Encounter Medications as of 11/02/2021  Medication Sig   acetaminophen (TYLENOL) 500 MG tablet Take 1,000 mg by mouth as needed for moderate pain or headache.   albuterol (PROVENTIL) (2.5 MG/3ML) 0.083% nebulizer solution Take 2.5 mg by  nebulization every 6 (six) hours as needed for wheezing or shortness of breath.   ALPRAZolam (XANAX) 0.5 MG tablet Take 0.5 mg by mouth at bedtime.   Azelastine HCl (ASTEPRO) 0.15 % SOLN Place 1 spray into the nose daily.   Budeson-Glycopyrrol-Formoterol (BREZTRI AEROSPHERE) 160-9-4.8 MCG/ACT AERO Inhale 2 puffs into the lungs in the morning and at bedtime.   calcium carbonate (OS-CAL) 600 MG TABS tablet Take 600 mg by mouth 2 (two) times daily with a meal.   Coenzyme Q10 100 MG TABS Take 100 mg by mouth every evening.    FLUoxetine (PROZAC) 20  MG capsule Take 20 mg by mouth daily.   fluticasone (FLONASE) 50 MCG/ACT nasal spray Place 1 spray into both nostrils daily.   furosemide (LASIX) 40 MG tablet Take 1 tablet (40 mg total) by mouth as needed for edema (swelling).   levothyroxine (SYNTHROID) 75 MCG tablet TAKE 1/2 TABLET(37.5 MCG) BY MOUTH DAILY BEFORE BREAKFAST   metoprolol tartrate (LOPRESSOR) 25 MG tablet Take 0.5 tablets (12.5 mg total) by mouth 2 (two) times daily.   montelukast (SINGULAIR) 10 MG tablet Take 1 tablet (10 mg total) by mouth at bedtime.   Multiple Vitamin (MULTIVITAMIN) tablet Take 1 tablet by mouth daily.   pantoprazole (PROTONIX) 40 MG tablet TAKE 1 TABLET(40 MG) BY MOUTH DAILY   potassium chloride SA (KLOR-CON M) 20 MEQ tablet Take 1 tablet (20 mEq total) by mouth as needed (only on days you take the Lasix).   simvastatin (ZOCOR) 40 MG tablet Take 40 mg daily by mouth.   warfarin (COUMADIN) 2.5 MG tablet TAKE 2 TABLETS BY MOUTH EVERY DAY EXCEPT 1 TABLET ON SUNDAYS AND WEDNESDAYS OR AS DIRECTED   [DISCONTINUED] levothyroxine (SYNTHROID) 75 MCG tablet TAKE 1/2 TABLET(37.5 MCG) BY MOUTH DAILY BEFORE BREAKFAST   No facility-administered encounter medications on file as of 11/02/2021.   ALLERGIES: Allergies  Allergen Reactions   Iohexol Hives, Shortness Of Breath and Other (See Comments)     Pt. had a severe allergic reaction to IV contrast the last time she was injected  and had to be seen in the ER.    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 ) visit. Patient reports no anxiety, cold intolerance, constipation, depressed mood, diarrhea, fatigue, hair loss, heat intolerance, leg swelling, palpitations, tremors, weight gain or weight loss. The symptoms have been stable.   Alison Kubicki Munn is 81 y.o. female who is being seen in follow-up hypothyroidism.   -She denies weight loss, tremors, or heat and cold intolerance.    -She is on multiple medications including anticoagulation with warfarin related to her cardiac surgery for tricuspid regurgitation as well as atrial fibrillation. -Her current medication list does not include active amiodarone.   Review of systems  Constitutional: + Minimally fluctuating body weight,  current Body mass index is 24.75 kg/m. , no fatigue, no subjective hyperthermia, no subjective hypothermia Eyes: no blurry vision, no xerophthalmia ENT: no sore throat, no nodules palpated in throat, no dysphagia/odynophagia, no hoarseness Cardiovascular: no chest pain, no shortness of breath, intermittent palpitations (hx afib), no leg swelling Respiratory: no cough, no shortness of breath Gastrointestinal: no nausea/vomiting/diarrhea Musculoskeletal: no muscle/joint aches Skin: no rashes, no hyperemia Neurological: no tremors, no numbness, no tingling, no dizziness Psychiatric: no depression, no  anxiety    Objective:    BP 133/82   Pulse (!) 106   Ht '5\' 1"'$  (1.549 m)   Wt 131 lb (59.4 kg)   BMI 24.75 kg/m   Wt Readings from Last 3 Encounters:  11/02/21 131 lb (59.4 kg)  10/30/21 132 lb 9.6 oz (60.1 kg)  10/17/21 144 lb 12.8 oz (65.7 kg)  BP Readings from Last 3 Encounters:  11/02/21 133/82  10/30/21 120/78  10/17/21 (!) 147/77     Physical Exam- Limited  Constitutional:  Body mass index is 24.75 kg/m. , not in acute distress, normal state of mind Eyes:  EOMI, no exophthalmos Neck: Supple Cardiovascular: + Irregular HR (hx of afib), no murmurs, rubs, or gallops  Respiratory: Adequate breathing efforts, no crackles, rales, ronchi Musculoskeletal: no gross deformities, strength intact in all four extremities, no gross restriction of joint movements Skin:  no rashes, no hyperemia Neurological: no tremor with outstretched hands,    CMP     Component Value Date/Time   NA 144 08/12/2017 1409   K 3.8 08/12/2017 1409   CL 100 (L) 08/12/2017 1409   CO2 27 08/12/2017 1409   GLUCOSE 96 08/12/2017 1409   BUN 15 08/12/2017 1409   CREATININE 1.01 (H) 08/12/2017 1409   CALCIUM 9.2 08/12/2017 1409   PROT 6.7 07/03/2016 1157   ALBUMIN 3.8 07/03/2016 1157   AST 23 07/03/2016 1157   ALT 19 07/03/2016 1157   ALKPHOS 113 07/03/2016 1157   BILITOT 0.5 07/03/2016 1157   GFRNONAA 52 (L) 08/12/2017 1409   GFRAA >60 08/12/2017 1409    Diabetic Labs (most recent): Lab Results  Component Value Date   HGBA1C 5.6 07/03/2016    Lab Results  Component Value Date   TSH 2.334 10/17/2021   TSH 1.105 09/13/2020   TSH 1.065 03/14/2020   TSH 1.141 11/13/2019   TSH 2.202 05/13/2019   TSH 2.146 01/07/2019   TSH 0.084 (L) 09/29/2018   TSH 0.311 (L) 06/30/2018   TSH 2.700 03/13/2017   TSH 3.327 07/27/2016   FREET4 1.31 (H) 10/17/2021   FREET4 0.95 09/13/2020   FREET4 0.99 03/14/2020   FREET4 1.13 (H) 11/13/2019   FREET4 0.95 05/13/2019   FREET4 1.09 01/07/2019   FREET4  1.27 09/29/2018   FREET4 1.23 06/30/2018    Recent Results (from the past 2160 hour(s))  POCT INR     Status: Abnormal   Collection Time: 08/15/21  2:08 PM  Result Value Ref Range   INR 5.6 (A) 2.0 - 3.0  POCT INR     Status: Abnormal   Collection Time: 08/23/21 11:15 AM  Result Value Ref Range   INR 1.8 (A) 2.0 - 3.0  POCT INR     Status: Normal   Collection Time: 08/30/21 11:12 AM  Result Value Ref Range   INR 3.0 2.0 - 3.0  POCT INR     Status: Normal   Collection Time: 09/20/21 11:17 AM  Result Value Ref Range   INR 2.4 2.0 - 3.0  TSH     Status: None   Collection Time: 10/17/21 11:22 AM  Result Value Ref Range   TSH 2.334 0.350 - 4.500 uIU/mL    Comment: Performed by a 3rd Generation assay with a functional sensitivity of <=0.01 uIU/mL. Performed at [facility], [address], [place], [place] [address]   T4, free     Status: Abnormal   Collection Time: 10/17/21 11:22 AM  Result Value Ref Range   Free T4 1.31 (H) 0.61 - 1.12 ng/dL    Comment: (NOTE) Biotin ingestion may interfere with free T4 tests. If the results are inconsistent with the TSH level, previous test results, or the clinical presentation, then consider biotin interference. If needed, order repeat testing after stopping biotin. Performed at [facility], [place] [address] [place], [place] [address]   POCT INR  Status: Normal   Collection Time: 10/18/21 11:11 AM  Result Value Ref Range   INR 2.6 2.0 - 3.0    Latest Reference Range & Units 11/13/19 11:57 03/14/20 14:53 09/13/20 14:00 09/13/20 14:01 10/17/21 11:22  TSH 0.350 - 4.500 uIU/mL 1.141 1.065 1.105  2.334  T4,Free(Direct) 0.61 - 1.12 ng/dL 1.13 (H) 0.99  0.95 1.31 (H)  (H): Data is abnormally high  Assessment & Plan:   1.  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  She is advised to continue Levothyroxine 37.5 mcg po daily before  breakfast.   - We discussed about the correct intake of her thyroid hormone, on empty stomach at fasting, with water, separated by at least 30 minutes from breakfast and other medications,  and separated by more than 4 hours from calcium, iron, multivitamins, acid reflux medications (PPIs). -Patient is made aware of the fact that thyroid hormone replacement is needed for life, dose to be adjusted by periodic monitoring of thyroid function tests.   - I advised her  to maintain close follow up with Ralph Leyden, FNP for primary care needs.  I encouraged her to resume her follow-up with her cardiologist regarding her history of atrial fibrillation which required treatment with amiodarone.     I spent 18 minutes in the care of the patient today including review of labs from Thyroid Function, CMP, and other relevant labs ; imaging/biopsy records (current and previous including abstractions from other facilities); face-to-face time discussing  her lab results and symptoms, medications doses, her options of short and long term treatment based on the latest standards of care / guidelines;   and documenting the encounter.  Deloros Beretta Ericksen  participated in the discussions, expressed understanding, and voiced agreement with the above plans.  All questions were answered to her satisfaction. she is encouraged to contact clinic should she have any questions or concerns prior to her return visit.  Follow up plan: Return in about 1 year (around 11/03/2022) for Thyroid follow up, Previsit labs.   Rayetta Pigg, [facility] [address] [place], [place] [address] Phone: [phone] Fax: [phone]    11/02/2021, 2:25 PM

## 2021-11-03 DIAGNOSIS — M1712 Unilateral primary osteoarthritis, left knee: Secondary | ICD-10-CM | POA: Diagnosis not present

## 2021-11-08 DIAGNOSIS — M1711 Unilateral primary osteoarthritis, right knee: Secondary | ICD-10-CM | POA: Diagnosis not present

## 2021-11-10 DIAGNOSIS — M1712 Unilateral primary osteoarthritis, left knee: Secondary | ICD-10-CM | POA: Diagnosis not present

## 2021-11-22 DIAGNOSIS — Z299 Encounter for prophylactic measures, unspecified: Secondary | ICD-10-CM | POA: Diagnosis not present

## 2021-11-22 DIAGNOSIS — J441 Chronic obstructive pulmonary disease with (acute) exacerbation: Secondary | ICD-10-CM | POA: Diagnosis not present

## 2021-11-22 DIAGNOSIS — R059 Cough, unspecified: Secondary | ICD-10-CM | POA: Diagnosis not present

## 2021-11-28 DIAGNOSIS — N183 Chronic kidney disease, stage 3 unspecified: Secondary | ICD-10-CM | POA: Diagnosis not present

## 2021-11-28 DIAGNOSIS — I10 Essential (primary) hypertension: Secondary | ICD-10-CM | POA: Diagnosis not present

## 2021-11-28 DIAGNOSIS — J441 Chronic obstructive pulmonary disease with (acute) exacerbation: Secondary | ICD-10-CM | POA: Diagnosis not present

## 2021-11-28 DIAGNOSIS — Z299 Encounter for prophylactic measures, unspecified: Secondary | ICD-10-CM | POA: Diagnosis not present

## 2021-11-28 DIAGNOSIS — I4891 Unspecified atrial fibrillation: Secondary | ICD-10-CM | POA: Diagnosis not present

## 2021-11-29 ENCOUNTER — Ambulatory Visit (INDEPENDENT_AMBULATORY_CARE_PROVIDER_SITE_OTHER): Payer: Medicare Other | Admitting: *Deleted

## 2021-11-29 DIAGNOSIS — Z5181 Encounter for therapeutic drug level monitoring: Secondary | ICD-10-CM

## 2021-11-29 DIAGNOSIS — I4819 Other persistent atrial fibrillation: Secondary | ICD-10-CM

## 2021-11-29 LAB — POCT INR: INR: 2.8 (ref 2.0–3.0)

## 2021-11-29 NOTE — Patient Instructions (Signed)
Continue warfarin 2 tablets daily except for 1 tablet on Sundays and Wednesdays. Recheck INR in 6 weeks. 

## 2021-12-01 ENCOUNTER — Encounter: Payer: Self-pay | Admitting: Pulmonary Disease

## 2021-12-01 ENCOUNTER — Ambulatory Visit (INDEPENDENT_AMBULATORY_CARE_PROVIDER_SITE_OTHER): Payer: Medicare Other | Admitting: Pulmonary Disease

## 2021-12-01 VITALS — BP 130/68 | HR 62 | Temp 98.4°F | Ht 61.0 in | Wt 140.8 lb

## 2021-12-01 DIAGNOSIS — J301 Allergic rhinitis due to pollen: Secondary | ICD-10-CM

## 2021-12-01 DIAGNOSIS — J449 Chronic obstructive pulmonary disease, unspecified: Secondary | ICD-10-CM

## 2021-12-01 MED ORDER — ALBUTEROL SULFATE HFA 108 (90 BASE) MCG/ACT IN AERS: 2.0000 | INHALATION_SPRAY | Freq: Four times a day (QID) | RESPIRATORY_TRACT | 5 refills | Status: AC | PRN

## 2021-12-01 NOTE — Progress Notes (Signed)
[facility]  Chief Complaint  Patient presents with   Follow-up    States her breathing is worse since last ov.     Past Surgical History:  She  has a past surgical history that includes Colonoscopy (02/2008); INGUNAL HERNIA REPAIR (Left); Tubal ligation; Low anterior bowel resection (1999); Appendectomy; Vein ligation and stripping; Esophagogastroduodenoscopy (07/2007); Colonoscopy (N/A, 06/02/2012); Colonoscopy (N/A, 10/13/2015); TEE without cardioversion (N/A, 02/20/2016); Cardiac catheterization (N/A, 04/12/2016); Cataract extraction (Bilateral, 2017); Breast surgery (Right, 2003ish); Multiple extractions with alveoloplasty (N/A, 05/21/2016); Tooth extraction; Mitral valve repair (Right, 07/05/2016); Minimally invasive maze procedure (N/A, 07/05/2016); TEE without cardioversion (N/A, 07/05/2016); Clipping of atrial appendage (07/05/2016); Cardioversion (N/A, 08/16/2017); Colonoscopy with propofol (N/A, 11/20/2018); Esophagogastroduodenoscopy (egd) with propofol (N/A, 11/20/2018); polypectomy (11/20/2018); and Leg skin lesion  biopsy / excision.  Past Medical History:  Anxiety, OA, A fib s/p MAZE, Diastolic CHF, Colon polyp, Colon cancer 1999, Depression, Diverticulosis, DM type 2, DVT in leg, Esophageal stricture, GERD, Large Hiatal hernia, Nephrolithiasis, HLD, Mitral regurgitation, Schatzki's ring  Constitutional:  BP 130/68 (BP Location: Right Arm, Patient Position: Sitting)   Pulse 62   Temp 98.4 F (36.9 C) (Temporal)   Ht '5\' 1"'$  (1.549 m)   Wt 140 lb 12.8 oz (63.9 kg)   SpO2 95% Comment: ra  BMI 26.60 kg/m   Brief Summary:  [name] [name] is a 81 y.o. female with COPD with asthma and allergies.      Subjective:   She felt better after her round of medrol at last visit in April.  A couple weeks ago she got more cough, wheeze, and chest congestion.  Saw her provider and was given medrol short and started on doxycycline.  Still  has cough with yellow sputum and some sinus congestion.  No fever, or hemoptysis.  Not having reflux.  Physical Exam:   Appearance - well kempt   ENMT - no sinus tenderness, no oral exudate, no LAN, Mallampati 3 airway, no stridor  Respiratory - equal breath sounds bilaterally, no wheezing or rales  CV - s1s2 regular rate and rhythm, 2/6 SM  Ext - no clubbing, no edema  Skin - no rashes  Psych - normal mood and affect     Pulmonary testing:  PFT 05/11/16 >> FEV1 1.47 (77%), FEV1% 68, TLC 4.84 (101%), DLCO 65%, +BD  Chest imaging:  CT chest 12/17/17 >> large hiatal hernia, small effusions, changes of cirrhosis CT sinus 12/26/20 >> clear sinuses  Cardiac Tests:  Echo 10/26/16 >> EF 60 to 65%, s/p MV valvuloplasty, PASP 43 mmHg  Social History:  She  reports that she has never smoked. She has been exposed to tobacco smoke. She has never used smokeless tobacco. She reports that she does not drink alcohol and does not use drugs.  Family History:  Her family history includes Aneurysm in her brother; Brain cancer in her sister; Breast cancer in her mother; Cancer - Lung in her sister; Prostate cancer in her brother; Rheum arthritis in her father.     Assessment/Plan:   COPD with asthma. - slowly improving from recent exacerbation - finish course of doxycycline - continue breztri and singulair - prn albuterol; will arrange for [facility] - she has a nebulizer machine  Seasonal allergic rhinitis. - continue fluticasone, azelastine, singulair  Atrial fibrillation/flutter, s/p MVR, Chronic diastolic CHF, Tachycardia. - followed by Dr. Carlyle Dolly with [facility]  Time Spent Involved in Patient Care on Day of Examination:  26 minutes  Follow up:   Patient Instructions  Follow up in 6 months  Medication List:   Allergies as of 12/01/2021       Reactions   Iohexol Hives, Shortness Of Breath, Other (See Comments)    Pt. had a severe allergic reaction to IV contrast  the last time she was injected and had to be seen in the ER.    Hydrocodone-acetaminophen Hives   Nabumetone Rash   Prednisone Rash        Medication List        Accurate as of December 01, 2021  9:46 AM. If you have any questions, ask your nurse or doctor.          acetaminophen 500 MG tablet Commonly known as: TYLENOL Take 1,000 mg by mouth as needed for moderate pain or headache.   albuterol (2.5 MG/3ML) 0.083% nebulizer solution Commonly known as: PROVENTIL Take 2.5 mg by nebulization every 6 (six) hours as needed for wheezing or shortness of breath. What changed: Another medication with the same name was added. Make sure you understand how and when to take each. Changed by: Chesley Mires, MD   albuterol 108 (90 Base) MCG/ACT inhaler Commonly known as: Ventolin HFA Inhale 2 puffs into the lungs every 6 (six) hours as needed for wheezing or shortness of breath. What changed: You were already taking a medication with the same name, and this prescription was added. Make sure you understand how and when to take each. Changed by: Chesley Mires, MD   ALPRAZolam 0.5 MG tablet Commonly known as: XANAX Take 0.5 mg by mouth at bedtime.   Azelastine HCl 0.15 % Soln Commonly known as: Astepro Place 1 spray into the nose daily.   Breztri Aerosphere 160-9-4.8 MCG/ACT Aero Generic drug: Budeson-Glycopyrrol-Formoterol Inhale 2 puffs into the lungs in the morning and at bedtime.   calcium carbonate 600 MG Tabs tablet Commonly known as: OS-CAL Take 600 mg by mouth 2 (two) times daily with a meal.   Coenzyme Q10 100 MG Tabs Take 100 mg by mouth every evening.   doxycycline 100 MG capsule Commonly known as: VIBRAMYCIN Take 100 mg by mouth 2 (two) times daily.   FLUoxetine 20 MG capsule Commonly known as: PROZAC Take 20 mg by mouth daily.   fluticasone 50 MCG/ACT nasal spray Commonly known as: FLONASE Place 1 spray into both nostrils daily.   furosemide 40 MG  tablet Commonly known as: LASIX Take 1 tablet (40 mg total) by mouth as needed for edema (swelling).   levothyroxine 75 MCG tablet Commonly known as: SYNTHROID TAKE 1/2 TABLET(37.5 MCG) BY MOUTH DAILY BEFORE BREAKFAST   metoprolol tartrate 25 MG tablet Commonly known as: LOPRESSOR Take 0.5 tablets (12.5 mg total) by mouth 2 (two) times daily.   montelukast 10 MG tablet Commonly known as: SINGULAIR Take 1 tablet (10 mg total) by mouth at bedtime.   multivitamin tablet Take 1 tablet by mouth daily.   pantoprazole 40 MG tablet Commonly known as: PROTONIX TAKE 1 TABLET(40 MG) BY MOUTH DAILY   potassium chloride SA 20 MEQ tablet Commonly known as: KLOR-CON M Take 1 tablet (20 mEq total) by mouth as needed (only on days you take the Lasix).   simvastatin 40 MG tablet Commonly known as: ZOCOR Take 40 mg daily by mouth.   warfarin 2.5 MG tablet Commonly known as: COUMADIN Take as directed by the anticoagulation clinic. If you are unsure how to take this medication, talk to your nurse or doctor. Original instructions:  TAKE 2 TABLETS BY MOUTH EVERY DAY EXCEPT 1 TABLET ON SUNDAYS AND WEDNESDAYS OR AS DIRECTED        Signature:  Chesley Mires, MD [place] Pager - [phone] 12/01/2021, 9:46 AM

## 2021-12-01 NOTE — Patient Instructions (Signed)
Follow up in 6 months 

## 2021-12-12 DIAGNOSIS — E7800 Pure hypercholesterolemia, unspecified: Secondary | ICD-10-CM | POA: Diagnosis not present

## 2021-12-12 DIAGNOSIS — R110 Nausea: Secondary | ICD-10-CM | POA: Diagnosis not present

## 2021-12-12 DIAGNOSIS — F419 Anxiety disorder, unspecified: Secondary | ICD-10-CM | POA: Diagnosis not present

## 2021-12-12 DIAGNOSIS — Z789 Other specified health status: Secondary | ICD-10-CM | POA: Diagnosis not present

## 2021-12-12 DIAGNOSIS — Z713 Dietary counseling and surveillance: Secondary | ICD-10-CM | POA: Diagnosis not present

## 2021-12-12 DIAGNOSIS — Z299 Encounter for prophylactic measures, unspecified: Secondary | ICD-10-CM | POA: Diagnosis not present

## 2021-12-12 DIAGNOSIS — R5383 Other fatigue: Secondary | ICD-10-CM | POA: Diagnosis not present

## 2021-12-12 DIAGNOSIS — Z0000 Encounter for general adult medical examination without abnormal findings: Secondary | ICD-10-CM | POA: Diagnosis not present

## 2021-12-12 DIAGNOSIS — Z79899 Other long term (current) drug therapy: Secondary | ICD-10-CM | POA: Diagnosis not present

## 2021-12-12 DIAGNOSIS — Z7189 Other specified counseling: Secondary | ICD-10-CM | POA: Diagnosis not present

## 2021-12-12 DIAGNOSIS — Z6826 Body mass index (BMI) 26.0-26.9, adult: Secondary | ICD-10-CM | POA: Diagnosis not present

## 2021-12-26 DIAGNOSIS — I10 Essential (primary) hypertension: Secondary | ICD-10-CM | POA: Diagnosis not present

## 2021-12-26 DIAGNOSIS — E876 Hypokalemia: Secondary | ICD-10-CM | POA: Diagnosis not present

## 2021-12-26 DIAGNOSIS — Z789 Other specified health status: Secondary | ICD-10-CM | POA: Diagnosis not present

## 2021-12-26 DIAGNOSIS — R413 Other amnesia: Secondary | ICD-10-CM | POA: Diagnosis not present

## 2021-12-26 DIAGNOSIS — Z299 Encounter for prophylactic measures, unspecified: Secondary | ICD-10-CM | POA: Diagnosis not present

## 2021-12-26 DIAGNOSIS — Z6824 Body mass index (BMI) 24.0-24.9, adult: Secondary | ICD-10-CM | POA: Diagnosis not present

## 2022-01-10 DIAGNOSIS — R413 Other amnesia: Secondary | ICD-10-CM | POA: Diagnosis not present

## 2022-01-11 ENCOUNTER — Ambulatory Visit: Payer: Medicare Other | Attending: Cardiology | Admitting: *Deleted

## 2022-01-11 DIAGNOSIS — I4819 Other persistent atrial fibrillation: Secondary | ICD-10-CM | POA: Insufficient documentation

## 2022-01-11 DIAGNOSIS — Z5181 Encounter for therapeutic drug level monitoring: Secondary | ICD-10-CM | POA: Diagnosis not present

## 2022-01-11 LAB — POCT INR: INR: 2.4 (ref 2.0–3.0)

## 2022-01-11 NOTE — Patient Instructions (Signed)
Description   Continue warfarin 2 tablets daily except for 1 tablet on Sundays and Wednesdays. Recheck INR in 6 weeks.

## 2022-01-24 DIAGNOSIS — Z299 Encounter for prophylactic measures, unspecified: Secondary | ICD-10-CM | POA: Diagnosis not present

## 2022-01-24 DIAGNOSIS — F419 Anxiety disorder, unspecified: Secondary | ICD-10-CM | POA: Diagnosis not present

## 2022-01-24 DIAGNOSIS — R4181 Age-related cognitive decline: Secondary | ICD-10-CM | POA: Diagnosis not present

## 2022-01-24 DIAGNOSIS — I10 Essential (primary) hypertension: Secondary | ICD-10-CM | POA: Diagnosis not present

## 2022-01-24 DIAGNOSIS — R413 Other amnesia: Secondary | ICD-10-CM | POA: Diagnosis not present

## 2022-01-24 DIAGNOSIS — F332 Major depressive disorder, recurrent severe without psychotic features: Secondary | ICD-10-CM | POA: Diagnosis not present

## 2022-01-24 DIAGNOSIS — Z789 Other specified health status: Secondary | ICD-10-CM | POA: Diagnosis not present

## 2022-01-25 ENCOUNTER — Encounter: Payer: Self-pay | Admitting: Gastroenterology

## 2022-01-25 ENCOUNTER — Ambulatory Visit (INDEPENDENT_AMBULATORY_CARE_PROVIDER_SITE_OTHER): Payer: Medicare Other | Admitting: Gastroenterology

## 2022-01-25 VITALS — BP 144/84 | HR 61 | Temp 98.1°F | Ht 61.0 in | Wt 129.4 lb

## 2022-01-25 DIAGNOSIS — K219 Gastro-esophageal reflux disease without esophagitis: Secondary | ICD-10-CM

## 2022-01-25 DIAGNOSIS — K5900 Constipation, unspecified: Secondary | ICD-10-CM | POA: Diagnosis not present

## 2022-01-25 NOTE — Progress Notes (Signed)
Gastroenterology Office Note     Primary Care Physician:  Ralph Leyden, FNP  Primary Gastroenterologist: Dr. Gala Romney    Chief Complaint   Chief Complaint  Patient presents with   Follow-up    Follow up after colonoscopy, and constipation     History of Present Illness   [name] [name] is an 81 y.o. female presenting today in follow-up with a history of GERD, dysphagia, constipation, colon cancer in 1999 s/p LAR. Colonoscopy up-to-date as of 2020.   Returns today in early interval follow-up as was last seen with rectal bleeding in setting of constipation. Felt to be likely hemorrhoid-related.   Constipation more manageable now and improved with Miralax daily. She sometimes forgets to take this. No further rectal bleeding. No rectal pain.   No dysphagia. GERD controlled on pantoprazole once daily.   She does at times notes RLQ discomfort with exertion, movement, attributed to inguinal hernia.        Past Medical History:  Diagnosis Date   Anxiety    Arthritis    Asthma    Atrial fibrillation, persistent ([name])    Chronic diastolic congestive heart failure ([name])    Colon adenoma    Colon cancer ([place]) 1999   status post low anterior resection, limited stage disease requiring no adjuvant therapy   COPD (chronic obstructive pulmonary disease) ([name])    Depression    Diverticulosis    DM (dermatomyositis)    DVT (deep venous thrombosis) ([name])    in leg- long time ago   Dyspnea    with activity   Dysrhythmia    Esophageal dysphagia    GERD (gastroesophageal reflux disease)    Headache    Heart murmur    Hematuria    Hemorrhoids    Hiatal hernia    History of kidney stones    x 2   Hypercholesterolemia    Incidental pulmonary nodule 05/08/2016   8 mm vague opacity RML noted on CT scan   Mitral regurgitation    PONV (postoperative nausea and vomiting) 2003 ish    with breast biopsy   S/P minimally invasive maze operation for atrial fibrillation  07/05/2016   Complete bilateral atrial lesion set using cryothermy and bipolar radiofrequency ablation with clipping of LA appendage via right mini thoracotomy approach   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/05/2016   Complex valvuloplasty including artificial Gore-tex neochord placement x6 and 30 mm Sorin Memo 3D ring annuloplasty via right mini thoracotomy approach   Schatzki's ring    Tricuspid regurgitation     Past Surgical History:  Procedure Laterality Date   APPENDECTOMY     BREAST SURGERY Right 2003ish   biopsy   CARDIAC CATHETERIZATION N/A 04/12/2016   Procedure: Right/Left Heart Cath and Coronary Angiography;  Surgeon: Leonie Man, MD;  Location: [place] [facility];  Service: Cardiovascular;  Laterality: N/A;   CARDIOVERSION N/A 08/16/2017   Procedure: CARDIOVERSION;  Surgeon: Arnoldo Lenis, MD;  [facility]: [name];  Service: Endoscopy;  Laterality: N/A;   CATARACT EXTRACTION Bilateral 2017   CLIPPING OF ATRIAL APPENDAGE  07/05/2016   Procedure: CLIPPING OF ATRIAL APPENDAGE;  Surgeon: Rexene Alberts, MD;  Location: [place];  Service: Open Heart Surgery;;   COLONOSCOPY  02/2008   Dr. Gala Romney- external hemorrhoidal tags o/w normal rectal mucosa, s/p surgical resection with a normal appearing anastomosis 12cm, pan colonic diverticulum   COLONOSCOPY N/A 06/02/2012   XKG:YJEHUD post low anterior resection. Pancolonic diverticulosis. Colonic  polyp-tubular adenoma. Surveillance due 2019.    COLONOSCOPY N/A 10/13/2015   Procedure: COLONOSCOPY;  Surgeon: Daneil Dolin, MD;  [name];  Service: Endoscopy;  Laterality: N/A;  0930   COLONOSCOPY WITH PROPOFOL N/A 11/20/2018   Procedure: COLONOSCOPY WITH PROPOFOL;  Surgeon: Daneil Dolin, MD;  [name];  Service: Endoscopy;  Laterality: N/A;  2:30pm   ESOPHAGOGASTRODUODENOSCOPY  07/2007   Dr. Daiva Nakayama cervical esophageal web, schatzki ring, large hiatal hernia   ESOPHAGOGASTRODUODENOSCOPY  (EGD) WITH PROPOFOL N/A 11/20/2018   Procedure: ESOPHAGOGASTRODUODENOSCOPY (EGD) WITH PROPOFOL;  Surgeon: Daneil Dolin, MD;  [name];  Service: Endoscopy;  Laterality: N/A;   INGUNAL HERNIA REPAIR Left    LEG SKIN LESION  BIOPSY / EXCISION     LOW ANTERIOR BOWEL RESECTION  1999   NO ADJ CHEMO   MINIMALLY INVASIVE MAZE PROCEDURE N/A 07/05/2016   Procedure: MINIMALLY INVASIVE MAZE PROCEDURE;  Surgeon: Rexene Alberts, MD;  Location: [place];  Service: Open Heart Surgery;  Laterality: N/A;   MITRAL VALVE REPAIR Right 07/05/2016   Procedure: MINIMALLY INVASIVE MITRAL VALVE REPAIR (MVR);  Surgeon: Rexene Alberts, MD;  Location: [place];  Service: Open Heart Surgery;  Laterality: Right;   MULTIPLE EXTRACTIONS WITH ALVEOLOPLASTY N/A 05/21/2016   Procedure: MULTIPLE EXTRACTION OF TOOTH #'S 5, 21 WITH ALVEOLOPLASTY AND GROSS DEBRIDEMENT OF TEETH;  Surgeon: Lenn Cal, DDS;  Location: [place];  Service: Oral Surgery;  Laterality: N/A;   POLYPECTOMY  11/20/2018   Procedure: POLYPECTOMY;  Surgeon: Daneil Dolin, MD;  [name];  Service: Endoscopy;;  colon   TEE WITHOUT CARDIOVERSION N/A 02/20/2016   Procedure: TRANSESOPHAGEAL ECHOCARDIOGRAM (TEE);  Surgeon: Arnoldo Lenis, MD;  [name];  Service: Endoscopy;  Laterality: N/A;   TEE WITHOUT CARDIOVERSION N/A 07/05/2016   Procedure: TRANSESOPHAGEAL ECHOCARDIOGRAM (TEE);  Surgeon: Rexene Alberts, MD;  Location: [place];  Service: Open Heart Surgery;  Laterality: N/A;   TOOTH EXTRACTION     TUBAL LIGATION     VEIN LIGATION AND STRIPPING      Current Outpatient Medications  Medication Sig Dispense Refill   acetaminophen (TYLENOL) 500 MG tablet Take 1,000 mg by mouth as needed for moderate pain or headache.     albuterol (PROVENTIL) (2.5 MG/3ML) 0.083% nebulizer solution Take 2.5 mg by nebulization every 6 (six) hours as needed for wheezing or shortness of breath.     albuterol (VENTOLIN HFA) 108 (90  Base) MCG/ACT inhaler Inhale 2 puffs into the lungs every 6 (six) hours as needed for wheezing or shortness of breath. 1 each 5   ALPRAZolam (XANAX) 0.5 MG tablet Take 0.5 mg by mouth at bedtime.     Azelastine HCl (ASTEPRO) 0.15 % SOLN Place 1 spray into the nose daily. 30 mL 3   Budeson-Glycopyrrol-Formoterol (BREZTRI AEROSPHERE) 160-9-4.8 MCG/ACT AERO Inhale 2 puffs into the lungs in the morning and at bedtime. 10.7 g 0   calcium carbonate (OS-CAL) 600 MG TABS tablet Take 600 mg by mouth 2 (two) times daily with a meal.     Coenzyme Q10 100 MG TABS Take 100 mg by mouth every evening.      FLUoxetine (PROZAC) 20 MG capsule Take 20 mg by mouth daily.     fluticasone (FLONASE) 50 MCG/ACT nasal spray Place 1 spray into both nostrils daily. 16 g 5   furosemide (LASIX) 40 MG tablet Take 1 tablet (40 mg total) by mouth as  needed for edema (swelling).     levothyroxine (SYNTHROID) 75 MCG tablet TAKE 1/2 TABLET(37.5 MCG) BY MOUTH DAILY BEFORE BREAKFAST 45 tablet 0   metoprolol tartrate (LOPRESSOR) 25 MG tablet Take 0.5 tablets (12.5 mg total) by mouth 2 (two) times daily. 30 tablet 11   montelukast (SINGULAIR) 10 MG tablet Take 1 tablet (10 mg total) by mouth at bedtime. 30 tablet 5   Multiple Vitamin (MULTIVITAMIN) tablet Take 1 tablet by mouth daily.     pantoprazole (PROTONIX) 40 MG tablet TAKE 1 TABLET(40 MG) BY MOUTH DAILY 90 tablet 3   potassium chloride SA (KLOR-CON M) 20 MEQ tablet Take 1 tablet (20 mEq total) by mouth as needed (only on days you take the Lasix). 30 tablet 2   simvastatin (ZOCOR) 40 MG tablet Take 40 mg daily by mouth.     warfarin (COUMADIN) 2.5 MG tablet TAKE 2 TABLETS BY MOUTH EVERY DAY EXCEPT 1 TABLET ON SUNDAYS AND WEDNESDAYS OR AS DIRECTED 180 tablet 1   doxycycline (VIBRAMYCIN) 100 MG capsule Take 100 mg by mouth 2 (two) times daily. (Patient not taking: Reported on 01/25/2022)     No current facility-administered medications for this visit.    Allergies as of  01/25/2022 - Review Complete 01/25/2022  Allergen Reaction Noted   Iohexol Hives, Shortness Of Breath, and Other (See Comments) 07/16/2008   Hydrocodone-acetaminophen Hives 04/26/2010   Nabumetone Rash 04/26/2010   Prednisone Rash 10/13/2015    Family History  Problem Relation Age of Onset   Breast cancer Mother    Rheum arthritis Father    Cancer - Lung Sister    Brain cancer Sister    Prostate cancer Brother    Aneurysm Brother    Colon cancer Neg Hx     Social History   Socioeconomic History   Marital status: Married    Spouse name: Not on file   Number of children: 2   Years of education: Not on file   Highest education level: Not on file  Occupational History   Not on file  Tobacco Use   Smoking status: Never    Passive exposure: Past   Smokeless tobacco: Never  Vaping Use   Vaping Use: Never used  Substance and Sexual Activity   Alcohol use: No    Alcohol/week: 0.0 standard drinks of alcohol   Drug use: No   Sexual activity: Not Currently  Other Topics Concern   Not on file  Social History Narrative   Not on file   Social Determinants of Health   Financial Resource Strain: Not on file  Food Insecurity: Not on file  Transportation Needs: Not on file  Physical Activity: Not on file  Stress: Not on file  Social Connections: Not on file  Intimate Partner Violence: Not on file     Review of Systems   Gen: Denies any fever, chills, fatigue, weight loss, lack of appetite.  CV: Denies chest pain, heart palpitations, peripheral edema, syncope.  Resp: Denies shortness of breath at rest or with exertion. Denies wheezing or cough.  GI: Denies dysphagia or odynophagia. Denies jaundice, hematemesis, fecal incontinence. GU : Denies urinary burning, urinary frequency, urinary hesitancy MS: Denies joint pain, muscle weakness, cramps, or limitation of movement.  Derm: Denies rash, itching, dry skin Psych: Denies depression, anxiety, memory loss, and  confusion Heme: Denies bruising, bleeding, and enlarged lymph nodes.   Physical Exam   BP (!) 144/84   Pulse 61   Temp 98.1 F (36.7 C)  Ht '5\' 1"'$  (1.549 m)   Wt 129 lb 6.4 oz (58.7 kg)   BMI 24.45 kg/m  General:   Alert and oriented. Pleasant and cooperative. Well-nourished and well-developed.  Head:  Normocephalic and atraumatic. Eyes:  Without icterus Abdomen:  +BS, soft, non-tender and non-distended. No HSM noted. No guarding or rebound. No masses appreciated.  Rectal:  Deferred  Msk:  Symmetrical without gross deformities. Normal posture. Extremities:  Without edema. Neurologic:  Alert and  oriented x4;  grossly normal neurologically. Skin:  Intact without significant lesions or rashes. Psych:  Alert and cooperative. Normal mood and affect.   Assessment   [name] [name] is an 81 y.o. female presenting today in follow-up with a history of GERD, dysphagia, constipation, colon cancer in 1999 s/p LAR. Colonoscopy up-to-date as of 2020.   GERD: controlled on pantoprazole daily. No dysphagia.   Constipation: doing well on Miralax daily. No rectal bleeding.  History of colon cancer: if recurrent bleeding, recommend colonoscopy. Otherwise, following clinically.      PLAN    Continue PPI daily Miralax daily Return in 6 months Call if any concerns   Annitta Needs, PhD, ANP-BC [facility] Gastroenter[facility]

## 2022-01-25 NOTE — Patient Instructions (Signed)
Continue to take Miralax once each day.   Please call if any worsening constipation. Please call if rectal bleeding, changes in bowel habits, rectal pain.   We will see you in 6 months!  I enjoyed seeing you again today! As you know, I value our relationship and want to provide genuine, compassionate, and quality care. I welcome your feedback. If you receive a survey regarding your visit,  I greatly appreciate you taking time to fill this out. See you next time!  Annitta Needs, PhD, ANP-BC [facility] Gastroenterology

## 2022-01-29 DIAGNOSIS — Z888 Allergy status to other drugs, medicaments and biological substances status: Secondary | ICD-10-CM | POA: Diagnosis not present

## 2022-01-29 DIAGNOSIS — I110 Hypertensive heart disease with heart failure: Secondary | ICD-10-CM | POA: Diagnosis not present

## 2022-01-29 DIAGNOSIS — Z86718 Personal history of other venous thrombosis and embolism: Secondary | ICD-10-CM | POA: Diagnosis not present

## 2022-01-29 DIAGNOSIS — J449 Chronic obstructive pulmonary disease, unspecified: Secondary | ICD-10-CM | POA: Diagnosis not present

## 2022-01-29 DIAGNOSIS — Z79899 Other long term (current) drug therapy: Secondary | ICD-10-CM | POA: Diagnosis not present

## 2022-01-29 DIAGNOSIS — Z91041 Radiographic dye allergy status: Secondary | ICD-10-CM | POA: Diagnosis not present

## 2022-01-29 DIAGNOSIS — F419 Anxiety disorder, unspecified: Secondary | ICD-10-CM | POA: Diagnosis not present

## 2022-01-29 DIAGNOSIS — I4891 Unspecified atrial fibrillation: Secondary | ICD-10-CM | POA: Diagnosis not present

## 2022-01-29 DIAGNOSIS — R42 Dizziness and giddiness: Secondary | ICD-10-CM | POA: Diagnosis not present

## 2022-01-29 DIAGNOSIS — R0600 Dyspnea, unspecified: Secondary | ICD-10-CM | POA: Diagnosis not present

## 2022-01-29 DIAGNOSIS — K449 Diaphragmatic hernia without obstruction or gangrene: Secondary | ICD-10-CM | POA: Diagnosis not present

## 2022-01-29 DIAGNOSIS — I5032 Chronic diastolic (congestive) heart failure: Secondary | ICD-10-CM | POA: Diagnosis not present

## 2022-01-29 DIAGNOSIS — F32A Depression, unspecified: Secondary | ICD-10-CM | POA: Diagnosis not present

## 2022-02-02 DIAGNOSIS — J309 Allergic rhinitis, unspecified: Secondary | ICD-10-CM | POA: Diagnosis not present

## 2022-02-02 DIAGNOSIS — I10 Essential (primary) hypertension: Secondary | ICD-10-CM | POA: Diagnosis not present

## 2022-02-02 DIAGNOSIS — H9319 Tinnitus, unspecified ear: Secondary | ICD-10-CM | POA: Diagnosis not present

## 2022-02-02 DIAGNOSIS — Z299 Encounter for prophylactic measures, unspecified: Secondary | ICD-10-CM | POA: Diagnosis not present

## 2022-02-02 DIAGNOSIS — Z6825 Body mass index (BMI) 25.0-25.9, adult: Secondary | ICD-10-CM | POA: Diagnosis not present

## 2022-02-02 DIAGNOSIS — I4891 Unspecified atrial fibrillation: Secondary | ICD-10-CM | POA: Diagnosis not present

## 2022-02-09 DIAGNOSIS — Z79899 Other long term (current) drug therapy: Secondary | ICD-10-CM | POA: Diagnosis not present

## 2022-02-09 DIAGNOSIS — Z91041 Radiographic dye allergy status: Secondary | ICD-10-CM | POA: Diagnosis not present

## 2022-02-09 DIAGNOSIS — Z886 Allergy status to analgesic agent status: Secondary | ICD-10-CM | POA: Diagnosis not present

## 2022-02-09 DIAGNOSIS — I110 Hypertensive heart disease with heart failure: Secondary | ICD-10-CM | POA: Diagnosis not present

## 2022-02-09 DIAGNOSIS — Z299 Encounter for prophylactic measures, unspecified: Secondary | ICD-10-CM | POA: Diagnosis not present

## 2022-02-09 DIAGNOSIS — Z888 Allergy status to other drugs, medicaments and biological substances status: Secondary | ICD-10-CM | POA: Diagnosis not present

## 2022-02-09 DIAGNOSIS — J449 Chronic obstructive pulmonary disease, unspecified: Secondary | ICD-10-CM | POA: Diagnosis not present

## 2022-02-09 DIAGNOSIS — I5032 Chronic diastolic (congestive) heart failure: Secondary | ICD-10-CM | POA: Diagnosis not present

## 2022-02-09 DIAGNOSIS — F419 Anxiety disorder, unspecified: Secondary | ICD-10-CM | POA: Diagnosis not present

## 2022-02-09 DIAGNOSIS — I10 Essential (primary) hypertension: Secondary | ICD-10-CM | POA: Diagnosis not present

## 2022-02-09 DIAGNOSIS — Z79890 Hormone replacement therapy (postmenopausal): Secondary | ICD-10-CM | POA: Diagnosis not present

## 2022-02-09 MED ORDER — MONTELUKAST SODIUM 10 MG PO TABS: 10.0000 mg | ORAL_TABLET | Freq: Every day | ORAL | 1 refills | Status: DC

## 2022-02-09 NOTE — Addendum Note (Signed)
Addended by: Dessie Coma on: 02/09/2022 08:46 AM   Modules accepted: Orders

## 2022-02-13 DIAGNOSIS — J441 Chronic obstructive pulmonary disease with (acute) exacerbation: Secondary | ICD-10-CM | POA: Diagnosis not present

## 2022-02-13 DIAGNOSIS — I10 Essential (primary) hypertension: Secondary | ICD-10-CM | POA: Diagnosis not present

## 2022-02-13 DIAGNOSIS — Z299 Encounter for prophylactic measures, unspecified: Secondary | ICD-10-CM | POA: Diagnosis not present

## 2022-02-16 DIAGNOSIS — I4891 Unspecified atrial fibrillation: Secondary | ICD-10-CM | POA: Diagnosis not present

## 2022-02-16 DIAGNOSIS — D6869 Other thrombophilia: Secondary | ICD-10-CM | POA: Diagnosis not present

## 2022-02-16 DIAGNOSIS — J441 Chronic obstructive pulmonary disease with (acute) exacerbation: Secondary | ICD-10-CM | POA: Diagnosis not present

## 2022-02-16 DIAGNOSIS — I10 Essential (primary) hypertension: Secondary | ICD-10-CM | POA: Diagnosis not present

## 2022-02-16 DIAGNOSIS — Z299 Encounter for prophylactic measures, unspecified: Secondary | ICD-10-CM | POA: Diagnosis not present

## 2022-02-20 DIAGNOSIS — J441 Chronic obstructive pulmonary disease with (acute) exacerbation: Secondary | ICD-10-CM | POA: Diagnosis not present

## 2022-02-20 DIAGNOSIS — I4891 Unspecified atrial fibrillation: Secondary | ICD-10-CM | POA: Diagnosis not present

## 2022-02-22 ENCOUNTER — Ambulatory Visit: Payer: Medicare Other | Attending: Cardiology | Admitting: *Deleted

## 2022-02-22 DIAGNOSIS — I4819 Other persistent atrial fibrillation: Secondary | ICD-10-CM | POA: Diagnosis not present

## 2022-02-22 DIAGNOSIS — Z5181 Encounter for therapeutic drug level monitoring: Secondary | ICD-10-CM | POA: Diagnosis not present

## 2022-02-22 LAB — POCT INR: INR: 2.1 (ref 2.0–3.0)

## 2022-02-22 NOTE — Patient Instructions (Signed)
Continue warfarin 2 tablets daily except for 1 tablet on Sundays and Wednesdays. Recheck INR in 6 weeks.

## 2022-03-20 DIAGNOSIS — I10 Essential (primary) hypertension: Secondary | ICD-10-CM | POA: Diagnosis not present

## 2022-03-20 DIAGNOSIS — Z299 Encounter for prophylactic measures, unspecified: Secondary | ICD-10-CM | POA: Diagnosis not present

## 2022-03-20 DIAGNOSIS — M810 Age-related osteoporosis without current pathological fracture: Secondary | ICD-10-CM | POA: Diagnosis not present

## 2022-03-20 DIAGNOSIS — I4891 Unspecified atrial fibrillation: Secondary | ICD-10-CM | POA: Diagnosis not present

## 2022-03-20 DIAGNOSIS — E039 Hypothyroidism, unspecified: Secondary | ICD-10-CM | POA: Diagnosis not present

## 2022-03-20 DIAGNOSIS — R252 Cramp and spasm: Secondary | ICD-10-CM | POA: Diagnosis not present

## 2022-04-05 ENCOUNTER — Ambulatory Visit: Payer: Medicare Other | Attending: Cardiology | Admitting: *Deleted

## 2022-04-05 DIAGNOSIS — Z5181 Encounter for therapeutic drug level monitoring: Secondary | ICD-10-CM | POA: Diagnosis not present

## 2022-04-05 DIAGNOSIS — I4819 Other persistent atrial fibrillation: Secondary | ICD-10-CM | POA: Diagnosis not present

## 2022-04-05 LAB — POCT INR: INR: 1.6 — AB (ref 2.0–3.0)

## 2022-04-05 NOTE — Patient Instructions (Signed)
Take warfarin 3 tablets tonight then increase dose to 2 tablets daily except for 1 tablet on Wednesdays. Recheck INR in 3 weeks.

## 2022-04-10 DIAGNOSIS — M810 Age-related osteoporosis without current pathological fracture: Secondary | ICD-10-CM | POA: Diagnosis not present

## 2022-04-13 DIAGNOSIS — I4891 Unspecified atrial fibrillation: Secondary | ICD-10-CM | POA: Diagnosis not present

## 2022-04-13 DIAGNOSIS — I739 Peripheral vascular disease, unspecified: Secondary | ICD-10-CM | POA: Diagnosis not present

## 2022-04-13 DIAGNOSIS — J441 Chronic obstructive pulmonary disease with (acute) exacerbation: Secondary | ICD-10-CM | POA: Diagnosis not present

## 2022-04-13 DIAGNOSIS — Z299 Encounter for prophylactic measures, unspecified: Secondary | ICD-10-CM | POA: Diagnosis not present

## 2022-04-19 DIAGNOSIS — F419 Anxiety disorder, unspecified: Secondary | ICD-10-CM | POA: Diagnosis not present

## 2022-04-19 DIAGNOSIS — I10 Essential (primary) hypertension: Secondary | ICD-10-CM | POA: Diagnosis not present

## 2022-04-19 DIAGNOSIS — J449 Chronic obstructive pulmonary disease, unspecified: Secondary | ICD-10-CM | POA: Diagnosis not present

## 2022-04-19 DIAGNOSIS — Z299 Encounter for prophylactic measures, unspecified: Secondary | ICD-10-CM | POA: Diagnosis not present

## 2022-04-26 ENCOUNTER — Ambulatory Visit: Payer: Medicare Other | Attending: Cardiology | Admitting: *Deleted

## 2022-04-26 DIAGNOSIS — Z5181 Encounter for therapeutic drug level monitoring: Secondary | ICD-10-CM

## 2022-04-26 DIAGNOSIS — I4819 Other persistent atrial fibrillation: Secondary | ICD-10-CM

## 2022-04-26 LAB — POCT INR: INR: 2.3 (ref 2.0–3.0)

## 2022-04-26 NOTE — Patient Instructions (Signed)
Continue warfarin  2 tablets daily except for 1 tablet on Wednesdays. Recheck INR in 4 weeks.

## 2022-05-02 ENCOUNTER — Ambulatory Visit: Payer: Medicare Other | Attending: Cardiology | Admitting: Cardiology

## 2022-05-02 ENCOUNTER — Encounter: Payer: Self-pay | Admitting: Cardiology

## 2022-05-02 VITALS — BP 118/74 | HR 98 | Ht 61.0 in | Wt 129.4 lb

## 2022-05-02 DIAGNOSIS — I4819 Other persistent atrial fibrillation: Secondary | ICD-10-CM | POA: Diagnosis not present

## 2022-05-02 DIAGNOSIS — D6869 Other thrombophilia: Secondary | ICD-10-CM | POA: Insufficient documentation

## 2022-05-02 DIAGNOSIS — I340 Nonrheumatic mitral (valve) insufficiency: Secondary | ICD-10-CM

## 2022-05-02 MED ORDER — METOPROLOL TARTRATE 25 MG PO TABS: 12.5000 mg | ORAL_TABLET | Freq: Two times a day (BID) | ORAL | 1 refills | Status: AC

## 2022-05-02 NOTE — Progress Notes (Signed)
Clinical Summary [name] [name] is a 82 y.o.female seen today for follow up of the following medical problem.s      1. Paroxysmal afib/ Aflutter - s/p MAZE procedure during MV repair - management complicated by sinus node dysfunction. Has affected ability to take av nodal agents. Significant brady on amio, now off.    - 03/2019 event monitor with just SR, rare PACs  - some palpitatoins at times, very random pattern. Overall tolerable.    2 Sinus node dysfunction - followed by EP, watchful waiting. Likely will require ppm at some point - no presyncope or syncope.        3. Mitral regurgitation, s/p MV repair - echo at last [facility] Jan 2017 showed normal LVEF, 3+(modarte) eccentric MR with immobile posterior leaflet.  - repeat echo 01/2016 moderate to severe MR. LVEF 60-65%. LVIDs 31. Symptoms unchanged since last visit. - 01/2016 TEE moderate to severe eccentric MR, severe TR.  - she is s/p mitral valve repair 07/05/16, Sorin Memo 3D Ring Annuloplasty (size 42m, catalog #[id number], serial #[id number]  - repeat echo 10/2016 with LVEF 60-65%, no WMAs, normal MV repair and ring, moderate TR.     - occasional LE edema, has prn lasix and has not needed - chronic SOB varies with weather, she is compliant with weathers   4. Chronic diastolic HF -has prn lasix, has not needed    5. History of GI bleed - followed by GI - notes indicate prior hemorroidal bleeding       6. COPD - followed by Dr SHalford Chessman      7. Venous insufficiency - followed by vascular     Past Medical History:  Diagnosis Date   Anxiety    Arthritis    Asthma    Atrial fibrillation, persistent ([name])    Chronic diastolic congestive heart failure ([name])    Colon adenoma    Colon cancer ([place] 1999   status post low anterior resection, limited stage disease requiring no adjuvant therapy   COPD (chronic obstructive pulmonary disease) ([name])    Depression    Diverticulosis    DM (dermatomyositis)    DVT  (deep venous thrombosis) ([name])    in leg- long time ago   Dyspnea    with activity   Dysrhythmia    Esophageal dysphagia    GERD (gastroesophageal reflux disease)    Headache    Heart murmur    Hematuria    Hemorrhoids    Hiatal hernia    History of kidney stones    x 2   Hypercholesterolemia    Incidental pulmonary nodule 05/08/2016   8 mm vague opacity RML noted on CT scan   Mitral regurgitation    PONV (postoperative nausea and vomiting) 2003 ish    with breast biopsy   S/P minimally invasive maze operation for atrial fibrillation 07/05/2016   Complete bilateral atrial lesion set using cryothermy and bipolar radiofrequency ablation with clipping of LA appendage via right mini thoracotomy approach   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/05/2016   Complex valvuloplasty including artificial Gore-tex neochord placement x6 and 30 mm Sorin Memo 3D ring annuloplasty via right mini thoracotomy approach   Schatzki's ring    Tricuspid regurgitation      Allergies  Allergen Reactions   Iohexol Hives, Shortness Of Breath and Other (See Comments)     Pt. had a severe allergic reaction to IV contrast the last time she was injected and had to  be seen in the ER.    Hydrocodone-Acetaminophen Hives   Nabumetone Rash   Prednisone Rash     Current Outpatient Medications  Medication Sig Dispense Refill   acetaminophen (TYLENOL) 500 MG tablet Take 1,000 mg by mouth as needed for moderate pain or headache.     albuterol (PROVENTIL) (2.5 MG/3ML) 0.083% nebulizer solution Take 2.5 mg by nebulization every 6 (six) hours as needed for wheezing or shortness of breath.     albuterol (VENTOLIN HFA) 108 (90 Base) MCG/ACT inhaler Inhale 2 puffs into the lungs every 6 (six) hours as needed for wheezing or shortness of breath. 1 each 5   ALPRAZolam (XANAX) 0.5 MG tablet Take 0.5 mg by mouth at bedtime.     Azelastine HCl (ASTEPRO) 0.15 % SOLN Place 1 spray into the nose daily. 30 mL 3    Budeson-Glycopyrrol-Formoterol (BREZTRI AEROSPHERE) 160-9-4.8 MCG/ACT AERO Inhale 2 puffs into the lungs in the morning and at bedtime. 10.7 g 0   calcium carbonate (OS-CAL) 600 MG TABS tablet Take 600 mg by mouth 2 (two) times daily with a meal.     Coenzyme Q10 100 MG TABS Take 100 mg by mouth every evening.      FLUoxetine (PROZAC) 20 MG capsule Take 20 mg by mouth daily.     fluticasone (FLONASE) 50 MCG/ACT nasal spray Place 1 spray into both nostrils daily. 16 g 5   furosemide (LASIX) 40 MG tablet Take 1 tablet (40 mg total) by mouth as needed for edema (swelling).     levothyroxine (SYNTHROID) 75 MCG tablet TAKE 1/2 TABLET(37.5 MCG) BY MOUTH DAILY BEFORE BREAKFAST 45 tablet 0   metoprolol tartrate (LOPRESSOR) 25 MG tablet Take 0.5 tablets (12.5 mg total) by mouth 2 (two) times daily. 30 tablet 11   montelukast (SINGULAIR) 10 MG tablet Take 1 tablet (10 mg total) by mouth at bedtime. 90 tablet 1   Multiple Vitamin (MULTIVITAMIN) tablet Take 1 tablet by mouth daily.     pantoprazole (PROTONIX) 40 MG tablet TAKE 1 TABLET(40 MG) BY MOUTH DAILY 90 tablet 3   potassium chloride SA (KLOR-CON M) 20 MEQ tablet Take 1 tablet (20 mEq total) by mouth as needed (only on days you take the Lasix). 30 tablet 2   simvastatin (ZOCOR) 40 MG tablet Take 40 mg daily by mouth.     warfarin (COUMADIN) 2.5 MG tablet TAKE 2 TABLETS BY MOUTH EVERY DAY EXCEPT 1 TABLET ON SUNDAYS AND WEDNESDAYS OR AS DIRECTED 180 tablet 1   No current facility-administered medications for this visit.     Past Surgical History:  Procedure Laterality Date   APPENDECTOMY     BREAST SURGERY Right 2003ish   biopsy   CARDIAC CATHETERIZATION N/A 04/12/2016   Procedure: Right/Left Heart Cath and Coronary Angiography;  Surgeon: [name] Man, MD;  Location: [place] [facility];  Service: Cardiovascular;  Laterality: N/A;   CARDIOVERSION N/A 08/16/2017   Procedure: CARDIOVERSION;  Surgeon: [name] Lenis, MD;  [name];   Service: Endoscopy;  Laterality: N/A;   CATARACT EXTRACTION Bilateral 2017   CLIPPING OF ATRIAL APPENDAGE  07/05/2016   Procedure: CLIPPING OF ATRIAL APPENDAGE;  Surgeon: Rexene Alberts, MD;  Location: [place];  Service: Open Heart Surgery;;   COLONOSCOPY  02/2008   Dr. Gala Romney- external hemorrhoidal tags o/w normal rectal mucosa, s/p surgical resection with a normal appearing anastomosis 12cm, pan colonic diverticulum   COLONOSCOPY N/A 06/02/2012   [id number] post low anterior resection. Pancolonic diverticulosis. Colonic  polyp-tubular adenoma. Surveillance due 2019.    COLONOSCOPY N/A 10/13/2015   Procedure: COLONOSCOPY;  Surgeon: Daneil Dolin, MD;  [name];  Service: Endoscopy;  Laterality: N/A;  0930   COLONOSCOPY WITH PROPOFOL N/A 11/20/2018   Procedure: COLONOSCOPY WITH PROPOFOL;  Surgeon: Daneil Dolin, MD;  [name];  Service: Endoscopy;  Laterality: N/A;  2:30pm   ESOPHAGOGASTRODUODENOSCOPY  07/2007   Dr. Daiva Nakayama cervical esophageal web, schatzki ring, large hiatal hernia   ESOPHAGOGASTRODUODENOSCOPY (EGD) WITH PROPOFOL N/A 11/20/2018   Procedure: ESOPHAGOGASTRODUODENOSCOPY (EGD) WITH PROPOFOL;  Surgeon: Daneil Dolin, MD;  [name];  Service: Endoscopy;  Laterality: N/A;   INGUNAL HERNIA REPAIR Left    LEG SKIN LESION  BIOPSY / EXCISION     LOW ANTERIOR BOWEL RESECTION  1999   NO ADJ CHEMO   MINIMALLY INVASIVE MAZE PROCEDURE N/A 07/05/2016   Procedure: MINIMALLY INVASIVE MAZE PROCEDURE;  Surgeon: Rexene Alberts, MD;  Location: [place];  Service: Open Heart Surgery;  Laterality: N/A;   MITRAL VALVE REPAIR Right 07/05/2016   Procedure: MINIMALLY INVASIVE MITRAL VALVE REPAIR (MVR);  Surgeon: Rexene Alberts, MD;  Location: [place];  Service: Open Heart Surgery;  Laterality: Right;   MULTIPLE EXTRACTIONS WITH ALVEOLOPLASTY N/A 05/21/2016   Procedure: MULTIPLE EXTRACTION OF TOOTH #'S 5, 21 WITH ALVEOLOPLASTY AND GROSS DEBRIDEMENT OF  TEETH;  Surgeon: Lenn Cal, DDS;  Location: [place];  Service: Oral Surgery;  Laterality: N/A;   POLYPECTOMY  11/20/2018   Procedure: POLYPECTOMY;  Surgeon: Daneil Dolin, MD;  [name];  Service: Endoscopy;;  colon   TEE WITHOUT CARDIOVERSION N/A 02/20/2016   Procedure: TRANSESOPHAGEAL ECHOCARDIOGRAM (TEE);  Surgeon: [name] Lenis, MD;  [name];  Service: Endoscopy;  Laterality: N/A;   TEE WITHOUT CARDIOVERSION N/A 07/05/2016   Procedure: TRANSESOPHAGEAL ECHOCARDIOGRAM (TEE);  Surgeon: Rexene Alberts, MD;  Location: [place];  Service: Open Heart Surgery;  Laterality: N/A;   TOOTH EXTRACTION     TUBAL LIGATION     VEIN LIGATION AND STRIPPING       Allergies  Allergen Reactions   Iohexol Hives, Shortness Of Breath and Other (See Comments)     Pt. had a severe allergic reaction to IV contrast the last time she was injected and had to be seen in the ER.    Hydrocodone-Acetaminophen Hives   Nabumetone Rash   Prednisone Rash      Family History  Problem Relation Age of Onset   Breast cancer Mother    Rheum arthritis Father    Cancer - Lung Sister    Brain cancer Sister    Prostate cancer Brother    Aneurysm Brother    Colon cancer Neg Hx      Social History Ms. Trembley reports that she has never smoked. She has been exposed to tobacco smoke. She has never used smokeless tobacco. Ms. Yost reports no history of alcohol use.   Review of Systems CONSTITUTIONAL: No weight loss, fever, chills, weakness or fatigue.  HEENT: Eyes: No visual loss, blurred vision, double vision or yellow sclerae.No hearing loss, sneezing, congestion, runny nose or sore throat.  SKIN: No rash or itching.  CARDIOVASCULAR: per hpi RESPIRATORY: No shortness of breath, cough or sputum.  GASTROINTESTINAL: No anorexia, nausea, vomiting or diarrhea. No abdominal pain or blood.  GENITOURINARY: No burning on urination, no polyuria NEUROLOGICAL: No headache,  dizziness, syncope, paralysis, ataxia, numbness or tingling in the  extremities. No change in bowel or bladder control.  MUSCULOSKELETAL: No muscle, back pain, joint pain or stiffness.  LYMPHATICS: No enlarged nodes. No history of splenectomy.  PSYCHIATRIC: No history of depression or anxiety.  ENDOCRINOLOGIC: No reports of sweating, cold or heat intolerance. No polyuria or polydipsia.  Marland Kitchen   Physical Examination Today's Vitals   05/02/22 1138  BP: 118/74  Pulse: 98  SpO2: 97%  Weight: 129 lb 6.4 oz (58.7 kg)  Height: '5\' 1"'$  (1.549 m)   Body mass index is 24.45 kg/m.  Gen: resting comfortably, no acute distress HEENT: no scleral icterus, pupils equal round and reactive, no palptable cervical adenopathy,  CV: RRR, no m/r/g no jvd Resp: Clear to auscultation bilaterally GI: abdomen is soft, non-tender, non-distended, normal bowel sounds, no hepatosplenomegaly MSK: extremities are warm, no edema.  Skin: warm, no rash Neuro:  no focal deficits Psych: appropriate affect   Diagnostic Studies     Assessment and Plan  1. AFib/Aflutter/acquired thrombophlia - management limited due to sinus node dysfunction, prior bradycardia on amio. May need pacemaker at some point, followed by ep  - continues to do reasonably well with just low dose lopressor. Will change so she can take additional 12.'5mg'$  as needed.  - EKG today shows rate controlled afib   2. Mitral regurgitation/Mitral valve repair - s/p mitral valve repair - no recent troubles, continue to monitor       3. Chronic diastolic HF - euvolemic without symptoms, continue prn lasix      [name] [name], M.D.

## 2022-05-02 NOTE — Patient Instructions (Signed)
Medication Instructions:  Continue the Lopressor at 12.'5mg'$  twice a day, may add additional 12.'5mg'$  as needed Continue all other medications.     Labwork: none  Testing/Procedures: none  Follow-Up: 6 months   Any Other Special Instructions Will Be Listed Below (If Applicable).   If you need a refill on your cardiac medications before your next appointment, please call your pharmacy.

## 2022-05-03 ENCOUNTER — Other Ambulatory Visit: Payer: Self-pay | Admitting: Cardiology

## 2022-05-03 NOTE — Telephone Encounter (Signed)
Refill request for warfarin:  Last INR was 2.3 on 04/26/22 Next INR due on 05/24/22 LOV was 05/02/22  [name] MD  Refill approved.

## 2022-05-24 ENCOUNTER — Ambulatory Visit: Payer: Medicare Other | Attending: Cardiology | Admitting: *Deleted

## 2022-05-24 DIAGNOSIS — I4819 Other persistent atrial fibrillation: Secondary | ICD-10-CM | POA: Diagnosis not present

## 2022-05-24 DIAGNOSIS — Z5181 Encounter for therapeutic drug level monitoring: Secondary | ICD-10-CM | POA: Diagnosis not present

## 2022-05-24 LAB — POCT INR: INR: 5.5 — AB (ref 2.0–3.0)

## 2022-05-24 NOTE — Patient Instructions (Signed)
Hold warfarin x 3 days (Thursday, Friday and Saturday) then resume 2 tablets daily except for 1 tablet on Wednesdays. Recheck INR in 1 week.

## 2022-05-31 ENCOUNTER — Ambulatory Visit: Payer: Medicare Other | Attending: Cardiology | Admitting: *Deleted

## 2022-05-31 DIAGNOSIS — I4819 Other persistent atrial fibrillation: Secondary | ICD-10-CM

## 2022-05-31 DIAGNOSIS — Z5181 Encounter for therapeutic drug level monitoring: Secondary | ICD-10-CM

## 2022-05-31 LAB — POCT INR: INR: 1.5 — AB (ref 2.0–3.0)

## 2022-05-31 NOTE — Patient Instructions (Signed)
Take warfarin 3 tablets tonight then resume 2 tablets daily except for 1 tablet on Wednesdays. Recheck INR in 2 weeks.

## 2022-06-21 ENCOUNTER — Ambulatory Visit: Payer: Medicare Other | Attending: Cardiology | Admitting: *Deleted

## 2022-06-21 DIAGNOSIS — Z5181 Encounter for therapeutic drug level monitoring: Secondary | ICD-10-CM | POA: Diagnosis not present

## 2022-06-21 DIAGNOSIS — I4819 Other persistent atrial fibrillation: Secondary | ICD-10-CM | POA: Diagnosis not present

## 2022-06-21 LAB — POCT INR: POC INR: 2.7

## 2022-06-21 NOTE — Patient Instructions (Signed)
Description   Continue taking 2 tablets daily except for 1 tablet on Wednesdays. Recheck INR in 3 weeks.

## 2022-06-22 DIAGNOSIS — Z299 Encounter for prophylactic measures, unspecified: Secondary | ICD-10-CM | POA: Diagnosis not present

## 2022-06-22 DIAGNOSIS — I4891 Unspecified atrial fibrillation: Secondary | ICD-10-CM | POA: Diagnosis not present

## 2022-06-22 DIAGNOSIS — I700 Atherosclerosis of aorta: Secondary | ICD-10-CM | POA: Diagnosis not present

## 2022-06-22 DIAGNOSIS — N390 Urinary tract infection, site not specified: Secondary | ICD-10-CM | POA: Diagnosis not present

## 2022-06-22 DIAGNOSIS — I10 Essential (primary) hypertension: Secondary | ICD-10-CM | POA: Diagnosis not present

## 2022-06-22 DIAGNOSIS — D6869 Other thrombophilia: Secondary | ICD-10-CM | POA: Diagnosis not present

## 2022-06-27 DIAGNOSIS — Z20822 Contact with and (suspected) exposure to covid-19: Secondary | ICD-10-CM | POA: Diagnosis not present

## 2022-06-27 DIAGNOSIS — K219 Gastro-esophageal reflux disease without esophagitis: Secondary | ICD-10-CM | POA: Diagnosis not present

## 2022-06-27 DIAGNOSIS — Z888 Allergy status to other drugs, medicaments and biological substances status: Secondary | ICD-10-CM | POA: Diagnosis not present

## 2022-06-27 DIAGNOSIS — I4510 Unspecified right bundle-branch block: Secondary | ICD-10-CM | POA: Diagnosis not present

## 2022-06-27 DIAGNOSIS — Z85038 Personal history of other malignant neoplasm of large intestine: Secondary | ICD-10-CM | POA: Diagnosis not present

## 2022-06-27 DIAGNOSIS — F419 Anxiety disorder, unspecified: Secondary | ICD-10-CM | POA: Diagnosis not present

## 2022-06-27 DIAGNOSIS — R519 Headache, unspecified: Secondary | ICD-10-CM | POA: Diagnosis not present

## 2022-06-27 DIAGNOSIS — J329 Chronic sinusitis, unspecified: Secondary | ICD-10-CM | POA: Diagnosis not present

## 2022-06-27 DIAGNOSIS — Z1152 Encounter for screening for COVID-19: Secondary | ICD-10-CM | POA: Diagnosis not present

## 2022-06-27 DIAGNOSIS — G319 Degenerative disease of nervous system, unspecified: Secondary | ICD-10-CM | POA: Diagnosis not present

## 2022-06-27 DIAGNOSIS — Z86718 Personal history of other venous thrombosis and embolism: Secondary | ICD-10-CM | POA: Diagnosis not present

## 2022-06-27 DIAGNOSIS — I5032 Chronic diastolic (congestive) heart failure: Secondary | ICD-10-CM | POA: Diagnosis not present

## 2022-06-27 DIAGNOSIS — Z91041 Radiographic dye allergy status: Secondary | ICD-10-CM | POA: Diagnosis not present

## 2022-06-27 DIAGNOSIS — I110 Hypertensive heart disease with heart failure: Secondary | ICD-10-CM | POA: Diagnosis not present

## 2022-06-27 DIAGNOSIS — Z886 Allergy status to analgesic agent status: Secondary | ICD-10-CM | POA: Diagnosis not present

## 2022-06-27 DIAGNOSIS — I4891 Unspecified atrial fibrillation: Secondary | ICD-10-CM | POA: Diagnosis not present

## 2022-06-27 DIAGNOSIS — J449 Chronic obstructive pulmonary disease, unspecified: Secondary | ICD-10-CM | POA: Diagnosis not present

## 2022-07-04 DIAGNOSIS — I10 Essential (primary) hypertension: Secondary | ICD-10-CM | POA: Diagnosis not present

## 2022-07-04 DIAGNOSIS — I5032 Chronic diastolic (congestive) heart failure: Secondary | ICD-10-CM | POA: Diagnosis not present

## 2022-07-04 DIAGNOSIS — Z299 Encounter for prophylactic measures, unspecified: Secondary | ICD-10-CM | POA: Diagnosis not present

## 2022-07-04 DIAGNOSIS — J449 Chronic obstructive pulmonary disease, unspecified: Secondary | ICD-10-CM | POA: Diagnosis not present

## 2022-07-12 ENCOUNTER — Ambulatory Visit: Payer: Medicare Other | Attending: Cardiology

## 2022-07-12 DIAGNOSIS — Z9889 Other specified postprocedural states: Secondary | ICD-10-CM | POA: Diagnosis not present

## 2022-07-12 DIAGNOSIS — I4819 Other persistent atrial fibrillation: Secondary | ICD-10-CM | POA: Diagnosis not present

## 2022-07-12 DIAGNOSIS — Z5181 Encounter for therapeutic drug level monitoring: Secondary | ICD-10-CM | POA: Insufficient documentation

## 2022-07-12 LAB — POCT INR: INR: 2.2 (ref 2.0–3.0)

## 2022-07-12 NOTE — Patient Instructions (Signed)
Description   Continue taking 2 tablets daily except for 1 tablet on Wednesdays. Recheck INR in 4 weeks.

## 2022-07-13 ENCOUNTER — Ambulatory Visit (INDEPENDENT_AMBULATORY_CARE_PROVIDER_SITE_OTHER): Payer: Medicare Other | Admitting: Pulmonary Disease

## 2022-07-13 ENCOUNTER — Encounter: Payer: Self-pay | Admitting: Pulmonary Disease

## 2022-07-13 VITALS — BP 120/78 | HR 100 | Ht 61.0 in | Wt 132.6 lb

## 2022-07-13 DIAGNOSIS — J4489 Other specified chronic obstructive pulmonary disease: Secondary | ICD-10-CM | POA: Diagnosis not present

## 2022-07-13 DIAGNOSIS — J301 Allergic rhinitis due to pollen: Secondary | ICD-10-CM | POA: Diagnosis not present

## 2022-07-13 MED ORDER — BREZTRI AEROSPHERE 160-9-4.8 MCG/ACT IN AERO: 2.0000 | INHALATION_SPRAY | Freq: Two times a day (BID) | RESPIRATORY_TRACT | 0 refills | Status: DC

## 2022-07-13 MED ORDER — AZELASTINE HCL 0.15 % NA SOLN: 1.0000 | Freq: Every day | NASAL | 3 refills | Status: AC

## 2022-07-13 MED ORDER — BREZTRI AEROSPHERE 160-9-4.8 MCG/ACT IN AERO: 2.0000 | INHALATION_SPRAY | Freq: Two times a day (BID) | RESPIRATORY_TRACT | 5 refills | Status: DC

## 2022-07-13 NOTE — Progress Notes (Signed)
[facility]  Chief Complaint  Patient presents with   Follow-up    Pt f/u states that she has a wheezing going on >1 month, she gets up white mucus in the morning    Past Surgical History:  She  has a past surgical history that includes Colonoscopy (02/2008); INGUNAL HERNIA REPAIR (Left); Tubal ligation; Low anterior bowel resection (1999); Appendectomy; Vein ligation and stripping; Esophagogastroduodenoscopy (07/2007); Colonoscopy (N/A, 06/02/2012); Colonoscopy (N/A, 10/13/2015); TEE without cardioversion (N/A, 02/20/2016); Cardiac catheterization (N/A, 04/12/2016); Cataract extraction (Bilateral, 2017); Breast surgery (Right, 2003ish); Multiple extractions with alveoloplasty (N/A, 05/21/2016); Tooth extraction; Mitral valve repair (Right, 07/05/2016); Minimally invasive maze procedure (N/A, 07/05/2016); TEE without cardioversion (N/A, 07/05/2016); Clipping of atrial appendage (07/05/2016); Cardioversion (N/A, 08/16/2017); Colonoscopy with propofol (N/A, 11/20/2018); Esophagogastroduodenoscopy (egd) with propofol (N/A, 11/20/2018); polypectomy (11/20/2018); and Leg skin lesion  biopsy / excision.  Past Medical History:  Anxiety, OA, A fib s/p MAZE, Diastolic CHF, Colon polyp, Colon cancer 1999, Depression, Diverticulosis, DM type 2, DVT in leg, Esophageal stricture, GERD, Large Hiatal hernia, Nephrolithiasis, HLD, Mitral regurgitation, Schatzki's ring  Constitutional:  BP 120/78   Pulse 100   Ht 5\' 1"  (1.549 m)   Wt 132 lb 9.6 oz (60.1 kg)   SpO2 98%   BMI 25.05 kg/m   Brief Summary:  [name] [name] is a 82 y.o. female with COPD with asthma and allergies.      Subjective:   She has more shortness of breath for the past several weeks.  She ran out of breztri and didn't realize she needed a refill.  She has been using albuterol several times per day.  She has more sinus congestion and drainage.  Gets a headache across her forehead and  behind her ears.  Physical Exam:   Appearance - well kempt   ENMT - no sinus tenderness, no oral exudate, no LAN, Mallampati 3 airway, no stridor  Respiratory - equal breath sounds bilaterally, no wheezing or rales  CV - s1s2 regular rate and rhythm, 2/6  Ext - no clubbing, no edema  Skin - no rashes  Psych - normal mood and affect      Pulmonary testing:  PFT 05/11/16 >> FEV1 1.47 (77%), FEV1% 68, TLC 4.84 (101%), DLCO 65%, +BD  Chest imaging:  CT chest 12/17/17 >> large hiatal hernia, small effusions, changes of cirrhosis CT sinus 12/26/20 >> clear sinuses  Cardiac Tests:  Echo 10/26/16 >> EF 60 to 65%, s/p MV valvuloplasty, PASP 43 mmHg  Social History:  She  reports that she has never smoked. She has been exposed to tobacco smoke. She has never used smokeless tobacco. She reports that she does not drink alcohol and does not use drugs.  Family History:  Her family history includes Aneurysm in her brother; Brain cancer in her sister; Breast cancer in her mother; Cancer - Lung in her sister; Prostate cancer in her brother; Rheum arthritis in her father.     Assessment/Plan:   COPD with asthma. - reviewed the different roles for all of her medications - will restart breztri two puffs bid; sample provided - continue singulair 10 mg nightly - she has a nebulizer machine  - prn albuterol - if her symptoms persist, then might need to assess for biologic therapy  Seasonal allergic rhinitis. - azelastine in the morning - flonase, xyzal, and singulair at night  Atrial fibrillation/flutter, s/p MVR, Chronic diastolic CHF, Tachycardia. - followed by Dr. Carlyle Dolly with [facility]  Time Spent Involved in Patient Care on Day of Examination:  35 minutes  Follow up:   Patient Instructions  Breztri two puffs in the morning and two puffs in the evening, and rinse your mouth after each use  Singulair 10 mg pill nightly  Xzyal 5 mg pill nightly  Flonase 1  spray in each nostril nightly  Azelastine 1 spray in each nostril in the morning  Albuterol every 6 hours as needed for cough, wheeze, chest congestion, or shortness of breath  Follow up in 3 months  Medication List:   Allergies as of 07/13/2022       Reactions   Iohexol Hives, Shortness Of Breath, Other (See Comments)    Pt. had a severe allergic reaction to IV contrast the last time she was injected and had to be seen in the ER.    Hydrocodone-acetaminophen Hives   Nabumetone Rash   Prednisone Rash        Medication List        Accurate as of July 13, 2022 12:32 PM. If you have any questions, ask your nurse or doctor.          acetaminophen 500 MG tablet Commonly known as: TYLENOL Take 1,000 mg by mouth as needed for moderate pain or headache.   albuterol (2.5 MG/3ML) 0.083% nebulizer solution Commonly known as: PROVENTIL Take 2.5 mg by nebulization every 6 (six) hours as needed for wheezing or shortness of breath.   albuterol 108 (90 Base) MCG/ACT inhaler Commonly known as: Ventolin HFA Inhale 2 puffs into the lungs every 6 (six) hours as needed for wheezing or shortness of breath.   ALPRAZolam 0.5 MG tablet Commonly known as: XANAX Take 0.5 mg by mouth at bedtime.   amLODipine 2.5 MG tablet Commonly known as: NORVASC Take 2.5 mg by mouth at bedtime.   Azelastine HCl 0.15 % Soln Commonly known as: Astepro Place 1 spray into the nose daily.   Breztri Aerosphere 160-9-4.8 MCG/ACT Aero Generic drug: Budeson-Glycopyrrol-Formoterol Inhale 2 puffs into the lungs in the morning and at bedtime.   calcium carbonate 600 MG Tabs tablet Commonly known as: OS-CAL Take 600 mg by mouth 2 (two) times daily with a meal.   Coenzyme Q10 100 MG Tabs Take 100 mg by mouth every evening.   FLUoxetine 20 MG capsule Commonly known as: PROZAC Take 20 mg by mouth daily.   fluticasone 50 MCG/ACT nasal spray Commonly known as: FLONASE Place 1 spray into both nostrils  daily.   furosemide 40 MG tablet Commonly known as: LASIX Take 1 tablet (40 mg total) by mouth as needed for edema (swelling).   levocetirizine 5 MG tablet Commonly known as: XYZAL Take 5 mg by mouth daily.   levothyroxine 75 MCG tablet Commonly known as: SYNTHROID TAKE 1/2 TABLET(37.5 MCG) BY MOUTH DAILY BEFORE BREAKFAST   metoprolol tartrate 25 MG tablet Commonly known as: LOPRESSOR Take 0.5 tablets (12.5 mg total) by mouth 2 (two) times daily. (May take an additional 12.5mg  tab as needed.)   montelukast 10 MG tablet Commonly known as: SINGULAIR Take 1 tablet (10 mg total) by mouth at bedtime.   multivitamin tablet Take 1 tablet by mouth daily.   pantoprazole 40 MG tablet Commonly known as: PROTONIX TAKE 1 TABLET(40 MG) BY MOUTH DAILY   potassium chloride SA 20 MEQ tablet Commonly known as: KLOR-CON M Take 1 tablet (20 mEq total) by mouth as needed (only on days you take the Lasix).   simvastatin 40 MG tablet  Commonly known as: ZOCOR Take 40 mg daily by mouth.   warfarin 2.5 MG tablet Commonly known as: COUMADIN Take as directed by the anticoagulation clinic. If you are unsure how to take this medication, talk to your nurse or doctor. Original instructions: TAKE 2 TABLETS BY MOUTH EVERY DAY EXCEPT 1 TABLET ON  WEDNESDAYS OR AS DIRECTED        Signature:  Chesley Mires, MD [place] Pager - [phone] 07/13/2022, 12:32 PM

## 2022-07-13 NOTE — Patient Instructions (Signed)
Breztri two puffs in the morning and two puffs in the evening, and rinse your mouth after each use  Singulair 10 mg pill nightly  Xzyal 5 mg pill nightly  Flonase 1 spray in each nostril nightly  Azelastine 1 spray in each nostril in the morning  Albuterol every 6 hours as needed for cough, wheeze, chest congestion, or shortness of breath  Follow up in 3 months

## 2022-07-13 NOTE — Addendum Note (Signed)
Addended by: Priscille Kluver on: 07/13/2022 04:03 PM   Modules accepted: Orders

## 2022-07-19 DIAGNOSIS — I10 Essential (primary) hypertension: Secondary | ICD-10-CM | POA: Diagnosis not present

## 2022-07-19 DIAGNOSIS — Z299 Encounter for prophylactic measures, unspecified: Secondary | ICD-10-CM | POA: Diagnosis not present

## 2022-07-19 DIAGNOSIS — J449 Chronic obstructive pulmonary disease, unspecified: Secondary | ICD-10-CM | POA: Diagnosis not present

## 2022-07-19 DIAGNOSIS — I5032 Chronic diastolic (congestive) heart failure: Secondary | ICD-10-CM | POA: Diagnosis not present

## 2022-07-19 DIAGNOSIS — E039 Hypothyroidism, unspecified: Secondary | ICD-10-CM | POA: Diagnosis not present

## 2022-07-31 ENCOUNTER — Ambulatory Visit: Payer: Medicare Other | Admitting: Gastroenterology

## 2022-08-07 DIAGNOSIS — L570 Actinic keratosis: Secondary | ICD-10-CM | POA: Diagnosis not present

## 2022-08-07 DIAGNOSIS — Z1283 Encounter for screening for malignant neoplasm of skin: Secondary | ICD-10-CM | POA: Diagnosis not present

## 2022-08-07 DIAGNOSIS — L281 Prurigo nodularis: Secondary | ICD-10-CM | POA: Diagnosis not present

## 2022-08-07 DIAGNOSIS — D171 Benign lipomatous neoplasm of skin and subcutaneous tissue of trunk: Secondary | ICD-10-CM | POA: Diagnosis not present

## 2022-08-07 DIAGNOSIS — D239 Other benign neoplasm of skin, unspecified: Secondary | ICD-10-CM | POA: Diagnosis not present

## 2022-08-07 DIAGNOSIS — D485 Neoplasm of uncertain behavior of skin: Secondary | ICD-10-CM | POA: Diagnosis not present

## 2022-08-09 ENCOUNTER — Ambulatory Visit: Payer: Medicare Other | Attending: Cardiology | Admitting: *Deleted

## 2022-08-09 DIAGNOSIS — I4819 Other persistent atrial fibrillation: Secondary | ICD-10-CM | POA: Diagnosis not present

## 2022-08-09 DIAGNOSIS — Z5181 Encounter for therapeutic drug level monitoring: Secondary | ICD-10-CM | POA: Diagnosis not present

## 2022-08-09 LAB — POCT INR: INR: 5.2 — AB (ref 2.0–3.0)

## 2022-08-09 NOTE — Patient Instructions (Addendum)
Hold warfarin 2 nights (Thursday and Friday), take 1 tablet Saturday night then continue taking 2 tablets daily except for 1 tablet on Wednesdays. Recheck INR in 2 weeks. Bleeding and fall precautions discussed with pt and she verbalized understanding.

## 2022-08-19 DIAGNOSIS — F419 Anxiety disorder, unspecified: Secondary | ICD-10-CM | POA: Diagnosis not present

## 2022-08-19 DIAGNOSIS — Z86718 Personal history of other venous thrombosis and embolism: Secondary | ICD-10-CM | POA: Diagnosis not present

## 2022-08-19 DIAGNOSIS — I5032 Chronic diastolic (congestive) heart failure: Secondary | ICD-10-CM | POA: Diagnosis not present

## 2022-08-19 DIAGNOSIS — I10 Essential (primary) hypertension: Secondary | ICD-10-CM | POA: Diagnosis not present

## 2022-08-19 DIAGNOSIS — R079 Chest pain, unspecified: Secondary | ICD-10-CM | POA: Diagnosis not present

## 2022-08-19 DIAGNOSIS — I480 Paroxysmal atrial fibrillation: Secondary | ICD-10-CM | POA: Diagnosis not present

## 2022-08-19 DIAGNOSIS — I110 Hypertensive heart disease with heart failure: Secondary | ICD-10-CM | POA: Diagnosis not present

## 2022-08-20 ENCOUNTER — Telehealth: Payer: Self-pay | Admitting: *Deleted

## 2022-08-20 DIAGNOSIS — I4891 Unspecified atrial fibrillation: Secondary | ICD-10-CM | POA: Diagnosis not present

## 2022-08-20 DIAGNOSIS — Z299 Encounter for prophylactic measures, unspecified: Secondary | ICD-10-CM | POA: Diagnosis not present

## 2022-08-20 DIAGNOSIS — I10 Essential (primary) hypertension: Secondary | ICD-10-CM | POA: Diagnosis not present

## 2022-08-20 NOTE — Telephone Encounter (Signed)
Pt called stating she went to ED yesterday for A Fib.  They did blood work yesterday including a PT/INR and told her she may not need to come for her INR appt tomorrow.  Reviewed chart.  INR was 2.08.  Pt was told to cotine her current dose of warfarin and INR appt moved out 2 more weeks. She verbalized understanding.

## 2022-08-23 ENCOUNTER — Ambulatory Visit (INDEPENDENT_AMBULATORY_CARE_PROVIDER_SITE_OTHER): Payer: Medicare Other | Admitting: Gastroenterology

## 2022-08-23 ENCOUNTER — Telehealth: Payer: Self-pay | Admitting: *Deleted

## 2022-08-23 ENCOUNTER — Encounter: Payer: Self-pay | Admitting: Gastroenterology

## 2022-08-23 VITALS — BP 132/83 | HR 88 | Temp 97.6°F | Ht 61.0 in | Wt 138.0 lb

## 2022-08-23 DIAGNOSIS — K625 Hemorrhage of anus and rectum: Secondary | ICD-10-CM | POA: Diagnosis not present

## 2022-08-23 MED ORDER — HYDROCORTISONE (PERIANAL) 2.5 % EX CREA: 1.0000 | TOPICAL_CREAM | Freq: Two times a day (BID) | CUTANEOUS | 1 refills | Status: AC

## 2022-08-23 NOTE — Progress Notes (Signed)
Gastroenterology Office Note     Primary Care Physician:  Golden Pop, FNP  Primary Gastroenterologist: Dr. Jena Gauss    Chief Complaint   Chief Complaint  Patient presents with   Follow-up    Follow up on constipation and hemorrhoids     History of Present Illness   [name] [name] is an 82 y.o. female presenting today in follow-up with a history of  GERD, dysphagia, constipation, colon cancer in 1999 s/p LAR. Colonoscopy up-to-date as of 2020.   She had noted rectal bleeding last year in setting of known hemorrhoids. This has resolved as of last visit in Oct 2023. However, she notes recurrent intermittent bleeding. Low-volume. Continues with occasional constipation. Forgets to take Miralax. However, this is helpful when she takes it. Sometimes strains.   No dysphagia. GERD controlled on pantoprazole once daily.    Past Medical History:  Diagnosis Date   Anxiety    Arthritis    Asthma    Atrial fibrillation, persistent ([name])    Chronic diastolic congestive heart failure ([name])    Colon adenoma    Colon cancer ([name]) 1999   status post low anterior resection, limited stage disease requiring no adjuvant therapy   COPD (chronic obstructive pulmonary disease) ([name])    Depression    Diverticulosis    DM (dermatomyositis)    DVT (deep venous thrombosis) ([name])    in leg- long time ago   Dyspnea    with activity   Dysrhythmia    Esophageal dysphagia    GERD (gastroesophageal reflux disease)    Headache    Heart murmur    Hematuria    Hemorrhoids    Hiatal hernia    History of kidney stones    x 2   Hypercholesterolemia    Incidental pulmonary nodule 05/08/2016   8 mm vague opacity RML noted on CT scan   Mitral regurgitation    PONV (postoperative nausea and vomiting) 2003 ish    with breast biopsy   S/P minimally invasive maze operation for atrial fibrillation 07/05/2016   Complete bilateral atrial lesion set using cryothermy and bipolar radiofrequency  ablation with clipping of LA appendage via right mini thoracotomy approach   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/05/2016   Complex valvuloplasty including artificial Gore-tex neochord placement x6 and 30 mm Sorin Memo 3D ring annuloplasty via right mini thoracotomy approach   Schatzki's ring    Tricuspid regurgitation     Past Surgical History:  Procedure Laterality Date   APPENDECTOMY     BREAST SURGERY Right 2003ish   biopsy   CARDIAC CATHETERIZATION N/A 04/12/2016   Procedure: Right/Left Heart Cath and Coronary Angiography;  Surgeon: Marykay Lex, MD;  Location: [facility];  Service: Cardiovascular;  Laterality: N/A;   CARDIOVERSION N/A 08/16/2017   Procedure: CARDIOVERSION;  Surgeon: Antoine Poche, MD;  Location: [name];  Service: Endoscopy;  Laterality: N/A;   CATARACT EXTRACTION Bilateral 2017   CLIPPING OF ATRIAL APPENDAGE  07/05/2016   Procedure: CLIPPING OF ATRIAL APPENDAGE;  Surgeon: Purcell Nails, MD;  Location: [facility];  Service: Open Heart Surgery;;   COLONOSCOPY  02/2008   Dr. Jena Gauss- external hemorrhoidal tags o/w normal rectal mucosa, s/p surgical resection with a normal appearing anastomosis 12cm, pan colonic diverticulum   COLONOSCOPY N/A 06/02/2012   [id number] post low anterior resection. Pancolonic diverticulosis. Colonic polyp-tubular adenoma. Surveillance due 2019.    COLONOSCOPY N/A 10/13/2015   Procedure: COLONOSCOPY;  Surgeon: Molly Maduro  Sonnie Alamo, MD;  Location: [name];  Service: Endoscopy;  Laterality: N/A;  0930   COLONOSCOPY WITH PROPOFOL N/A 11/20/2018   Procedure: COLONOSCOPY WITH PROPOFOL;  Surgeon: Corbin Ade, MD;  Location: [name];  Service: Endoscopy;  Laterality: N/A;  2:30pm   ESOPHAGOGASTRODUODENOSCOPY  07/2007   Dr. Carron Curie cervical esophageal web, schatzki ring, large hiatal hernia   ESOPHAGOGASTRODUODENOSCOPY (EGD) WITH PROPOFOL N/A 11/20/2018   Procedure: ESOPHAGOGASTRODUODENOSCOPY (EGD) WITH  PROPOFOL;  Surgeon: Corbin Ade, MD;  [name];  Service: Endoscopy;  Laterality: N/A;   INGUNAL HERNIA REPAIR Left    LEG SKIN LESION  BIOPSY / EXCISION     LOW ANTERIOR BOWEL RESECTION  1999   NO ADJ CHEMO   MINIMALLY INVASIVE MAZE PROCEDURE N/A 07/05/2016   Procedure: MINIMALLY INVASIVE MAZE PROCEDURE;  Surgeon: Purcell Nails, MD;  Location: [facility];  Service: Open Heart Surgery;  Laterality: N/A;   MITRAL VALVE REPAIR Right 07/05/2016   Procedure: MINIMALLY INVASIVE MITRAL VALVE REPAIR (MVR);  Surgeon: Purcell Nails, MD;  Location: [facility] OR;  Service: Open Heart Surgery;  Laterality: Right;   MULTIPLE EXTRACTIONS WITH ALVEOLOPLASTY N/A 05/21/2016   Procedure: MULTIPLE EXTRACTION OF TOOTH #'S 5, 21 WITH ALVEOLOPLASTY AND GROSS DEBRIDEMENT OF TEETH;  Surgeon: Charlynne Pander, DDS;  Location: [facility] OR;  Service: Oral Surgery;  Laterality: N/A;   POLYPECTOMY  11/20/2018   Procedure: POLYPECTOMY;  Surgeon: Corbin Ade, MD;  [name];  Service: Endoscopy;;  colon   TEE WITHOUT CARDIOVERSION N/A 02/20/2016   Procedure: TRANSESOPHAGEAL ECHOCARDIOGRAM (TEE);  Surgeon: Antoine Poche, MD;  [name];  Service: Endoscopy;  Laterality: N/A;   TEE WITHOUT CARDIOVERSION N/A 07/05/2016   Procedure: TRANSESOPHAGEAL ECHOCARDIOGRAM (TEE);  Surgeon: Purcell Nails, MD;  Location: [facility] OR;  Service: Open Heart Surgery;  Laterality: N/A;   TOOTH EXTRACTION     TUBAL LIGATION     VEIN LIGATION AND STRIPPING      Current Outpatient Medications  Medication Sig Dispense Refill   acetaminophen (TYLENOL) 500 MG tablet Take 1,000 mg by mouth as needed for moderate pain or headache.     albuterol (PROVENTIL) (2.5 MG/3ML) 0.083% nebulizer solution Take 2.5 mg by nebulization every 6 (six) hours as needed for wheezing or shortness of breath.     albuterol (VENTOLIN HFA) 108 (90 Base) MCG/ACT inhaler Inhale 2 puffs into the lungs every 6 (six) hours as needed for  wheezing or shortness of breath. 1 each 5   ALPRAZolam (XANAX) 0.5 MG tablet Take 0.5 mg by mouth at bedtime.     amLODipine (NORVASC) 2.5 MG tablet Take 2.5 mg by mouth at bedtime.     Azelastine HCl (ASTEPRO) 0.15 % SOLN Place 1 spray into the nose daily. 30 mL 3   Budeson-Glycopyrrol-Formoterol (BREZTRI AEROSPHERE) 160-9-4.8 MCG/ACT AERO Inhale 2 puffs into the lungs in the morning and at bedtime. 10.7 g 5   Budeson-Glycopyrrol-Formoterol (BREZTRI AEROSPHERE) 160-9-4.8 MCG/ACT AERO Inhale 2 puffs into the lungs in the morning and at bedtime. 10.7 g 0   calcium carbonate (OS-CAL) 600 MG TABS tablet Take 600 mg by mouth 2 (two) times daily with a meal.     Coenzyme Q10 100 MG TABS Take 100 mg by mouth every evening.      FLUoxetine (PROZAC) 20 MG capsule Take 20 mg by mouth daily.     fluticasone (FLONASE) 50 MCG/ACT nasal spray Place 1 spray into both  nostrils daily. 16 g 5   hydrocortisone (ANUSOL-HC) 2.5 % rectal cream Place 1 Application rectally 2 (two) times daily. 30 g 1   levocetirizine (XYZAL) 5 MG tablet Take 5 mg by mouth daily.     levothyroxine (SYNTHROID) 75 MCG tablet TAKE 1/2 TABLET(37.5 MCG) BY MOUTH DAILY BEFORE BREAKFAST 45 tablet 0   metoprolol tartrate (LOPRESSOR) 25 MG tablet Take 0.5 tablets (12.5 mg total) by mouth 2 (two) times daily. (May take an additional 12.5mg  tab as needed.) 135 tablet 1   montelukast (SINGULAIR) 10 MG tablet Take 1 tablet (10 mg total) by mouth at bedtime. 90 tablet 1   Multiple Vitamin (MULTIVITAMIN) tablet Take 1 tablet by mouth daily.     pantoprazole (PROTONIX) 40 MG tablet TAKE 1 TABLET(40 MG) BY MOUTH DAILY 90 tablet 3   simvastatin (ZOCOR) 40 MG tablet Take 40 mg daily by mouth.     warfarin (COUMADIN) 2.5 MG tablet TAKE 2 TABLETS BY MOUTH EVERY DAY EXCEPT 1 TABLET ON  WEDNESDAYS OR AS DIRECTED 180 tablet 1   No current facility-administered medications for this visit.    Allergies as of 08/23/2022 - Review Complete 08/23/2022  Allergen  Reaction Noted   Iohexol Hives, Shortness Of Breath, and Other (See Comments) 07/16/2008   Hydrocodone-acetaminophen Hives 04/26/2010   Nabumetone Rash 04/26/2010   Prednisone Rash 10/13/2015    Family History  Problem Relation Age of Onset   Breast cancer Mother    Rheum arthritis Father    Cancer - Lung Sister    Brain cancer Sister    Prostate cancer Brother    Aneurysm Brother    Colon cancer Neg Hx     Social History   Socioeconomic History   Marital status: Married    Spouse name: Not on file   Number of children: 2   Years of education: Not on file   Highest education level: Not on file  Occupational History   Not on file  Tobacco Use   Smoking status: Never    Passive exposure: Past   Smokeless tobacco: Never  Vaping Use   Vaping Use: Never used  Substance and Sexual Activity   Alcohol use: No    Alcohol/week: 0.0 standard drinks of alcohol   Drug use: No   Sexual activity: Not Currently  Other Topics Concern   Not on file  Social History Narrative   Not on file   Social Determinants of Health   Financial Resource Strain: Not on file  Food Insecurity: Not on file  Transportation Needs: Not on file  Physical Activity: Not on file  Stress: Not on file  Social Connections: Not on file  Intimate Partner Violence: Not on file     Review of Systems   Gen: Denies any fever, chills, fatigue, weight loss, lack of appetite.  CV: Denies chest pain, heart palpitations, peripheral edema, syncope.  Resp: Denies shortness of breath at rest or with exertion. Denies wheezing or cough.  GI: Denies dysphagia or odynophagia. Denies jaundice, hematemesis, fecal incontinence. GU : Denies urinary burning, urinary frequency, urinary hesitancy MS: Denies joint pain, muscle weakness, cramps, or limitation of movement.  Derm: Denies rash, itching, dry skin Psych: Denies depression, anxiety, memory loss, and confusion Heme: Denies bruising, bleeding, and enlarged lymph  nodes.   Physical Exam   BP 132/83   Pulse 88   Temp 97.6 F (36.4 C)   Ht 5\' 1"  (1.549 m)   Wt 138 lb (62.6 kg)  BMI 26.07 kg/m  General:   Alert and oriented. Pleasant and cooperative. Well-nourished and well-developed.  Head:  Normocephalic and atraumatic. Eyes:  Without icterus Abdomen:  +BS, soft, non-tender and non-distended. No HSM noted. No guarding or rebound. No masses appreciated.  Rectal:  external hemorrhoid tags. Prolapsing grade 3 hemorrhoid.  Msk:  Symmetrical without gross deformities. Normal posture. Extremities:  Without edema. Neurologic:  Alert and  oriented x4;  grossly normal neurologically. Skin:  Intact without significant lesions or rashes. Psych:  Alert and cooperative. Normal mood and affect.   Assessment   [name] [name] is an 82 y.o. female presenting today in follow-up with a history of  GERD, dysphagia, constipation, colon cancer in 1999 s/p LAR. Colonoscopy up-to-date as of 2020.   She has had intermittent rectal bleeding over the past year, waxing and waning. Known internal hemorrhoids, which is the likely culprit. However, as per plan last year, if persistent bleeding, will pursue colonoscopy. I suspect benign anorectal source.   Constipation: take Miralax daily, as this has been helpful. Avoid straining and limit toilet time to 2-3 minutes. Not a good banding candidate in light of Coumadin.   GERD remains controlled on pantoprazole once daily.   Requesting to hold Coumadin prior to procedure.    PLAN    Proceed with colonoscopy by Dr. Jena Gauss in near future: the risks, benefits, and alternatives have been discussed with the patient in detail. The patient states understanding and desires to proceed. ASA 3 Requesting to hold Coumadin X 3-4 days prior to procedure Miralax daily   Gelene Mink, PhD, [facility] Gastroenter[facility]

## 2022-08-23 NOTE — Patient Instructions (Signed)
We are arranging a colonoscopy in the near future with Dr. Jena Gauss!  We are requesting to hold Coumadin 3-4 days before the procedure.  I sent in rectal cream for the hemorrhoids. Make sure to take Miralax daily to every other day. We want to avoid straining and limit toilet time to 2-3 minutes.   I enjoyed seeing you again today! At our first visit, I mentioned how I value our relationship and want to provide genuine, compassionate, and quality care. You may receive a survey regarding your visit with me, and I welcome your feedback! Thanks so much for taking the time to complete this. I look forward to seeing you again.   Gelene Mink, PhD, ANP-BC [facility] Gastroenterology

## 2022-08-23 NOTE — Telephone Encounter (Signed)
  Request for patient to stop medication prior to procedure or is needing cleareance  08/23/22  [name] [name] Sep 02, 1940  What type of surgery is being performed? Colonoscopy  When is surgery scheduled? TBD  What type of clearance is required (medical or pharmacy to hold medication or both? medication  Are there any medications that need to be held prior to surgery and how long? Coumadin x 3-4 days  Name of physician performing surgery?  Dr. Gavin Potters [facility] at [facility] Phone: [phone] Fax: [phone]  Anethesia type (none, local, MAC, general)? MAC

## 2022-08-24 NOTE — Telephone Encounter (Signed)
I tried to call the pt to set up tele pre op appt but no answer.

## 2022-08-24 NOTE — Telephone Encounter (Signed)
   Name: [name] [name]  DOB: Jan 05, 1941  MRN: [id number]  Primary Cardiologist: Dina Rich, MD   Preoperative team, please contact this patient and set up a phone call appointment for further preoperative risk assessment. Please obtain consent and complete medication review. Thank you for your help.  I confirm that guidance regarding antiplatelet and oral anticoagulation therapy has been completed and, if necessary, noted below.  Pharmacy has provided recommendations for holding anticoagulation.   Ronney Asters, NP 08/24/2022, 11:41 AM [facility]

## 2022-08-24 NOTE — Telephone Encounter (Signed)
Patient with diagnosis of afib on warfarin for anticoagulation.    Procedure: colonoscopy Date of procedure: TBD  CHA2DS2-VASc Score = 5  This indicates a 7.2% annual risk of stroke. The patient's score is based upon: CHF History: 1 HTN History: 0 Diabetes History: 1 Stroke History: 0 Vascular Disease History: 0 Age Score: 2 Gender Score: 1  DVT years ago.  CrCl Platelet count 228K  Per office protocol, patient can hold warfarin for 5 days prior to procedure. Patient will not need bridging with Lovenox (enoxaparin) around procedure.  **This guidance is not considered finalized until pre-operative APP has relayed final recommendations.**

## 2022-08-28 DIAGNOSIS — H43813 Vitreous degeneration, bilateral: Secondary | ICD-10-CM | POA: Diagnosis not present

## 2022-08-28 NOTE — Telephone Encounter (Signed)
Left message for the pt to call back and schedule a tele pre op appt.  

## 2022-08-29 NOTE — Telephone Encounter (Signed)
Lvm pt needs to call office to schedule telephone clearance.

## 2022-08-30 NOTE — Telephone Encounter (Signed)
Left another msg for pt.

## 2022-08-31 NOTE — Telephone Encounter (Signed)
I will update the requesting office the pt will need to call the office to set up tele appt for pre op clearance. We have attempted to reach the pt x 4. I will remove from the pre op call back pool.

## 2022-09-03 ENCOUNTER — Ambulatory Visit: Payer: Medicare Other | Attending: Cardiology | Admitting: *Deleted

## 2022-09-03 DIAGNOSIS — Z5181 Encounter for therapeutic drug level monitoring: Secondary | ICD-10-CM

## 2022-09-03 DIAGNOSIS — I4819 Other persistent atrial fibrillation: Secondary | ICD-10-CM | POA: Diagnosis not present

## 2022-09-03 LAB — POCT INR: INR: 3.7 — AB (ref 2.0–3.0)

## 2022-09-03 NOTE — Patient Instructions (Addendum)
Hold warfarin tonight then decrease dose to 2 tablets daily except for 1 tablet on Sundays and Wednesdays. Recheck INR in 3 weeks. Pending colonoscopy.  Date TBD.  Pt states she not sure she's going thru with it.  She has been approved to hold warfarin 5 days before procedure if she does.

## 2022-09-06 NOTE — Telephone Encounter (Signed)
LMTRC

## 2022-09-06 NOTE — Telephone Encounter (Signed)
We can hold warfarin 4 days prior to procedure. Appropriate to proceed.

## 2022-09-06 NOTE — Telephone Encounter (Signed)
See visit on 09/03/22 with cardiology. Please advise. Thank you

## 2022-09-10 DIAGNOSIS — L309 Dermatitis, unspecified: Secondary | ICD-10-CM | POA: Diagnosis not present

## 2022-09-10 DIAGNOSIS — L0100 Impetigo, unspecified: Secondary | ICD-10-CM | POA: Diagnosis not present

## 2022-09-18 NOTE — Telephone Encounter (Signed)
Will call pt to schedule procedure once get Dr.Rourk's July schedule

## 2022-09-21 ENCOUNTER — Encounter: Payer: Self-pay | Admitting: *Deleted

## 2022-09-21 NOTE — Telephone Encounter (Signed)
LMTRC.. will mail letter 

## 2022-09-24 ENCOUNTER — Ambulatory Visit: Payer: Medicare Other | Attending: Cardiology | Admitting: *Deleted

## 2022-09-24 DIAGNOSIS — I4819 Other persistent atrial fibrillation: Secondary | ICD-10-CM | POA: Diagnosis not present

## 2022-09-24 DIAGNOSIS — Z5181 Encounter for therapeutic drug level monitoring: Secondary | ICD-10-CM | POA: Diagnosis not present

## 2022-09-24 LAB — POCT INR: INR: 1.4 — AB (ref 2.0–3.0)

## 2022-09-24 NOTE — Patient Instructions (Signed)
Take warfarin 3 tablets tonight then increase dose to 2 tablets daily except 1 tablet on Sundays  Recheck INR in 2 weeks. Pending colonoscopy.  Date TBD.  Pt states she not sure she's going thru with it.  She has been approved to hold warfarin 5 days before procedure if she does.

## 2022-10-08 ENCOUNTER — Ambulatory Visit: Payer: Medicare Other | Attending: Cardiology | Admitting: *Deleted

## 2022-10-08 DIAGNOSIS — I4819 Other persistent atrial fibrillation: Secondary | ICD-10-CM

## 2022-10-08 DIAGNOSIS — Z5181 Encounter for therapeutic drug level monitoring: Secondary | ICD-10-CM

## 2022-10-08 LAB — POCT INR: INR: 3.1 — AB (ref 2.0–3.0)

## 2022-10-08 NOTE — Patient Instructions (Signed)
Continue warfarin 2 tablets daily except 1 tablet on Sundays  Recheck INR in 2 weeks. Pending colonoscopy.  Date TBD.  Pt states she not sure she's going thru with it.  She has been approved to hold warfarin 5 days before procedure if she does. Eat extra greens/salad today.

## 2022-10-22 ENCOUNTER — Ambulatory Visit: Payer: Medicare Other | Attending: Cardiology | Admitting: *Deleted

## 2022-10-22 DIAGNOSIS — Z5181 Encounter for therapeutic drug level monitoring: Secondary | ICD-10-CM | POA: Insufficient documentation

## 2022-10-22 DIAGNOSIS — K449 Diaphragmatic hernia without obstruction or gangrene: Secondary | ICD-10-CM | POA: Diagnosis not present

## 2022-10-22 DIAGNOSIS — Z299 Encounter for prophylactic measures, unspecified: Secondary | ICD-10-CM | POA: Diagnosis not present

## 2022-10-22 DIAGNOSIS — I5032 Chronic diastolic (congestive) heart failure: Secondary | ICD-10-CM | POA: Diagnosis not present

## 2022-10-22 DIAGNOSIS — I4819 Other persistent atrial fibrillation: Secondary | ICD-10-CM | POA: Diagnosis not present

## 2022-10-22 DIAGNOSIS — F419 Anxiety disorder, unspecified: Secondary | ICD-10-CM | POA: Diagnosis not present

## 2022-10-22 DIAGNOSIS — I10 Essential (primary) hypertension: Secondary | ICD-10-CM | POA: Diagnosis not present

## 2022-10-22 LAB — POCT INR: INR: 2.3 (ref 2.0–3.0)

## 2022-10-22 NOTE — Patient Instructions (Signed)
Continue warfarin 2 tablets daily except 1 tablet on Sundays  Recheck INR in 4 weeks. Pending colonoscopy.  Date TBD.  Pt states she not sure she's going thru with it.  She has been approved to hold warfarin 5 days before procedure if she does. Continue greens

## 2022-11-01 ENCOUNTER — Other Ambulatory Visit: Payer: Self-pay

## 2022-11-01 DIAGNOSIS — E032 Hypothyroidism due to medicaments and other exogenous substances: Secondary | ICD-10-CM

## 2022-11-02 DIAGNOSIS — E032 Hypothyroidism due to medicaments and other exogenous substances: Secondary | ICD-10-CM | POA: Diagnosis not present

## 2022-11-03 LAB — TSH: TSH: 1.4 u[IU]/mL (ref 0.450–4.500)

## 2022-11-03 LAB — T4, FREE: Free T4: 1.45 ng/dL (ref 0.82–1.77)

## 2022-11-05 ENCOUNTER — Ambulatory Visit: Payer: Medicare Other | Attending: Cardiology | Admitting: Cardiology

## 2022-11-05 ENCOUNTER — Ambulatory Visit: Payer: Self-pay | Admitting: *Deleted

## 2022-11-05 VITALS — BP 110/76 | HR 77 | Ht 61.0 in | Wt 132.6 lb

## 2022-11-05 DIAGNOSIS — Z9889 Other specified postprocedural states: Secondary | ICD-10-CM | POA: Insufficient documentation

## 2022-11-05 DIAGNOSIS — I5032 Chronic diastolic (congestive) heart failure: Secondary | ICD-10-CM | POA: Diagnosis not present

## 2022-11-05 DIAGNOSIS — I340 Nonrheumatic mitral (valve) insufficiency: Secondary | ICD-10-CM | POA: Insufficient documentation

## 2022-11-05 DIAGNOSIS — I10 Essential (primary) hypertension: Secondary | ICD-10-CM | POA: Insufficient documentation

## 2022-11-05 DIAGNOSIS — I4819 Other persistent atrial fibrillation: Secondary | ICD-10-CM | POA: Diagnosis not present

## 2022-11-05 DIAGNOSIS — D6869 Other thrombophilia: Secondary | ICD-10-CM | POA: Diagnosis not present

## 2022-11-05 MED ORDER — APIXABAN 5 MG PO TABS: 5.0000 mg | ORAL_TABLET | Freq: Two times a day (BID) | ORAL | 0 refills | Status: DC

## 2022-11-05 MED ORDER — APIXABAN 5 MG PO TABS: 5.0000 mg | ORAL_TABLET | Freq: Two times a day (BID) | ORAL | 6 refills | Status: DC

## 2022-11-05 MED ORDER — FUROSEMIDE 20 MG PO TABS: 20.0000 mg | ORAL_TABLET | ORAL | Status: DC | PRN

## 2022-11-05 NOTE — Patient Instructions (Signed)
Medication Instructions:   Stop Warfarin Begin Eliquis 5mg  twice a day - may start this Thursday  Continue all other medications.     Labwork:  none  Testing/Procedures:  none  Follow-Up:  6 months   Any Other Special Instructions Will Be Listed Below (If Applicable).   If you need a refill on your cardiac medications before your next appointment, please call your pharmacy.

## 2022-11-05 NOTE — Progress Notes (Signed)
Clinical Summary [name] [name] is a 82 y.o.female seen today for follow up of the following medical problem.s      1. Paroxysmal afib/ Aflutter - s/p MAZE procedure during MV repair - management complicated by sinus node dysfunction. Has affected ability to take av nodal agents. Significant brady on amio, now off.    - 03/2019 event monitor with just SR, rare PACs   - no recent palpitations.  - some bleeding toilet paper after wiping, small amount - followed by GI   2 Sinus node dysfunction - followed by EP, watchful waiting. Likely will require ppm at some point - no presyncope or syncope.        3. Mitral regurgitation, s/p MV repair - echo at last [facility] Jan 2017 showed normal LVEF, 3+(modarte) eccentric MR with immobile posterior leaflet.  - repeat echo 01/2016 moderate to severe MR. LVEF 60-65%. LVIDs 31. Symptoms unchanged since last visit. - 01/2016 TEE moderate to severe eccentric MR, severe TR.  - she is s/p mitral valve repair 07/05/16, Sorin Memo 3D Ring Annuloplasty (size 30mm, catalog #[id number], serial [id number])  - repeat echo 10/2016 with LVEF 60-65%, no WMAs, normal MV repair and ring, moderate TR.       - some recent SOB/DOE, often affected by weather or fumes. Better with inhaler - no significant LE edema   4. Chronic diastolic HF -no recent symptoms, has prn lasix     5. History of GI bleed - followed by GI - notes indicate prior hemorroidal bleeding       6. COPD - followed by Dr Craige Cotta       7. Venous insufficiency - followed by vascular   Past Medical History:  Diagnosis Date   Anxiety    Arthritis    Asthma    Atrial fibrillation, persistent ([name])    Chronic diastolic congestive heart failure ([name])    Colon adenoma    Colon cancer ([name]) 1999   status post low anterior resection, limited stage disease requiring no adjuvant therapy   COPD (chronic obstructive pulmonary disease) ([name])    Depression    Diverticulosis    DM  (dermatomyositis)    DVT (deep venous thrombosis) ([name])    in leg- long time ago   Dyspnea    with activity   Dysrhythmia    Esophageal dysphagia    GERD (gastroesophageal reflux disease)    Headache    Heart murmur    Hematuria    Hemorrhoids    Hiatal hernia    History of kidney stones    x 2   Hypercholesterolemia    Incidental pulmonary nodule 05/08/2016   8 mm vague opacity RML noted on CT scan   Mitral regurgitation    PONV (postoperative nausea and vomiting) 2003 ish    with breast biopsy   S/P minimally invasive maze operation for atrial fibrillation 07/05/2016   Complete bilateral atrial lesion set using cryothermy and bipolar radiofrequency ablation with clipping of LA appendage via right mini thoracotomy approach   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/05/2016   Complex valvuloplasty including artificial Gore-tex neochord placement x6 and 30 mm Sorin Memo 3D ring annuloplasty via right mini thoracotomy approach   Schatzki's ring    Tricuspid regurgitation      Allergies  Allergen Reactions   Iohexol Hives, Shortness Of Breath and Other (See Comments)     Pt. had a severe allergic reaction to IV contrast the last time she  was injected and had to be seen in the ER.    Hydrocodone-Acetaminophen Hives   Nabumetone Rash   Prednisone Rash     Current Outpatient Medications  Medication Sig Dispense Refill   acetaminophen (TYLENOL) 500 MG tablet Take 1,000 mg by mouth as needed for moderate pain or headache.     albuterol (PROVENTIL) (2.5 MG/3ML) 0.083% nebulizer solution Take 2.5 mg by nebulization every 6 (six) hours as needed for wheezing or shortness of breath.     albuterol (VENTOLIN HFA) 108 (90 Base) MCG/ACT inhaler Inhale 2 puffs into the lungs every 6 (six) hours as needed for wheezing or shortness of breath. 1 each 5   ALPRAZolam (XANAX) 0.5 MG tablet Take 0.5 mg by mouth at bedtime.     amLODipine (NORVASC) 2.5 MG tablet Take 2.5 mg by mouth at  bedtime.     Azelastine HCl (ASTEPRO) 0.15 % SOLN Place 1 spray into the nose daily. 30 mL 3   Budeson-Glycopyrrol-Formoterol (BREZTRI AEROSPHERE) 160-9-4.8 MCG/ACT AERO Inhale 2 puffs into the lungs in the morning and at bedtime. 10.7 g 5   Budeson-Glycopyrrol-Formoterol (BREZTRI AEROSPHERE) 160-9-4.8 MCG/ACT AERO Inhale 2 puffs into the lungs in the morning and at bedtime. 10.7 g 0   calcium carbonate (OS-CAL) 600 MG TABS tablet Take 600 mg by mouth 2 (two) times daily with a meal.     Coenzyme Q10 100 MG TABS Take 100 mg by mouth every evening.      FLUoxetine (PROZAC) 20 MG capsule Take 20 mg by mouth daily.     fluticasone (FLONASE) 50 MCG/ACT nasal spray Place 1 spray into both nostrils daily. 16 g 5   hydrocortisone (ANUSOL-HC) 2.5 % rectal cream Place 1 Application rectally 2 (two) times daily. 30 g 1   levocetirizine (XYZAL) 5 MG tablet Take 5 mg by mouth daily.     levothyroxine (SYNTHROID) 75 MCG tablet TAKE 1/2 TABLET(37.5 MCG) BY MOUTH DAILY BEFORE BREAKFAST 45 tablet 0   metoprolol tartrate (LOPRESSOR) 25 MG tablet Take 0.5 tablets (12.5 mg total) by mouth 2 (two) times daily. (May take an additional 12.5mg  tab as needed.) 135 tablet 1   montelukast (SINGULAIR) 10 MG tablet Take 1 tablet (10 mg total) by mouth at bedtime. 90 tablet 1   Multiple Vitamin (MULTIVITAMIN) tablet Take 1 tablet by mouth daily.     pantoprazole (PROTONIX) 40 MG tablet TAKE 1 TABLET(40 MG) BY MOUTH DAILY 90 tablet 3   simvastatin (ZOCOR) 40 MG tablet Take 40 mg daily by mouth.     warfarin (COUMADIN) 2.5 MG tablet TAKE 2 TABLETS BY MOUTH EVERY DAY EXCEPT 1 TABLET ON  WEDNESDAYS OR AS DIRECTED 180 tablet 1   No current facility-administered medications for this visit.     Past Surgical History:  Procedure Laterality Date   APPENDECTOMY     BREAST SURGERY Right 2003ish   biopsy   CARDIAC CATHETERIZATION N/A 04/12/2016   Procedure: Right/Left Heart Cath and Coronary Angiography;  Surgeon: Marykay Lex, MD;  Location: [facility] INVASIVE CV LAB;  Service: Cardiovascular;  Laterality: N/A;   CARDIOVERSION N/A 08/16/2017   Procedure: CARDIOVERSION;  Surgeon: Antoine Poche, MD;  Location: [name];  Service: Endoscopy;  Laterality: N/A;   CATARACT EXTRACTION Bilateral 2017   CLIPPING OF ATRIAL APPENDAGE  07/05/2016   Procedure: CLIPPING OF ATRIAL APPENDAGE;  Surgeon: Purcell Nails, MD;  Location: [facility] OR;  Service: Open Heart Surgery;;   COLONOSCOPY  02/2008   Dr. Jena Gauss- external  hemorrhoidal tags o/w normal rectal mucosa, s/p surgical resection with a normal appearing anastomosis 12cm, pan colonic diverticulum   COLONOSCOPY N/A 06/02/2012   BJY:NWGNFA post low anterior resection. Pancolonic diverticulosis. Colonic polyp-tubular adenoma. Surveillance due 2019.    COLONOSCOPY N/A 10/13/2015   Procedure: COLONOSCOPY;  Surgeon: Corbin Ade, MD;  Location: [name];  Service: Endoscopy;  Laterality: N/A;  0930   COLONOSCOPY WITH PROPOFOL N/A 11/20/2018   Procedure: COLONOSCOPY WITH PROPOFOL;  Surgeon: Corbin Ade, MD;  Location: [name];  Service: Endoscopy;  Laterality: N/A;  2:30pm   ESOPHAGOGASTRODUODENOSCOPY  07/2007   Dr. Carron Curie cervical esophageal web, schatzki ring, large hiatal hernia   ESOPHAGOGASTRODUODENOSCOPY (EGD) WITH PROPOFOL N/A 11/20/2018   Procedure: ESOPHAGOGASTRODUODENOSCOPY (EGD) WITH PROPOFOL;  Surgeon: Corbin Ade, MD;  Location: [name];  Service: Endoscopy;  Laterality: N/A;   INGUNAL HERNIA REPAIR Left    LEG SKIN LESION  BIOPSY / EXCISION     LOW ANTERIOR BOWEL RESECTION  1999   NO ADJ CHEMO   MINIMALLY INVASIVE MAZE PROCEDURE N/A 07/05/2016   Procedure: MINIMALLY INVASIVE MAZE PROCEDURE;  Surgeon: Purcell Nails, MD;  Location: [facility] OR;  Service: Open Heart Surgery;  Laterality: N/A;   MITRAL VALVE REPAIR Right 07/05/2016   Procedure: MINIMALLY INVASIVE MITRAL VALVE REPAIR (MVR);  Surgeon: Purcell Nails, MD;  Location[facility];   Service: Open Heart Surgery;  Laterality: Right;   MULTIPLE EXTRACTIONS WITH ALVEOLOPLASTY N/A 05/21/2016   Procedure: MULTIPLE EXTRACTION OF TOOTH #'S 5, 21 WITH ALVEOLOPLASTY AND GROSS DEBRIDEMENT OF TEETH;  Surgeon: Charlynne Pander, DDS;  Location: [facility] OR;  Service: Oral Surgery;  Laterality: N/A;   POLYPECTOMY  11/20/2018   Procedure: POLYPECTOMY;  Surgeon: Corbin Ade, MD;  Location: [name];  Service: Endoscopy;;  colon   TEE WITHOUT CARDIOVERSION N/A 02/20/2016   Procedure: TRANSESOPHAGEAL ECHOCARDIOGRAM (TEE);  Surgeon: Antoine Poche, MD;  Location: [name];  Service: Endoscopy;  Laterality: N/A;   TEE WITHOUT CARDIOVERSION N/A 07/05/2016   Procedure: TRANSESOPHAGEAL ECHOCARDIOGRAM (TEE);  Surgeon: Purcell Nails, MD;  Location[facility] OR;  Service: Open Heart Surgery;  Laterality: N/A;   TOOTH EXTRACTION     TUBAL LIGATION     VEIN LIGATION AND STRIPPING       Allergies  Allergen Reactions   Iohexol Hives, Shortness Of Breath and Other (See Comments)     Pt. had a severe allergic reaction to IV contrast the last time she was injected and had to be seen in the ER.    Hydrocodone-Acetaminophen Hives   Nabumetone Rash   Prednisone Rash      Family History  Problem Relation Age of Onset   Breast cancer Mother    Rheum arthritis Father    Cancer - Lung Sister    Brain cancer Sister    Prostate cancer Brother    Aneurysm Brother    Colon cancer Neg Hx      Social History Ms. Breeden reports that she has never smoked. She has been exposed to tobacco smoke. She has never used smokeless tobacco. Ms. Wardrop reports no history of alcohol use.   Review of Systems CONSTITUTIONAL: No weight loss, fever, chills, weakness or fatigue.  HEENT: Eyes: No visual loss, blurred vision, double vision or yellow sclerae.No hearing loss, sneezing, congestion, runny nose or sore throat.  SKIN: No rash or itching.  CARDIOVASCULAR: per hpi RESPIRATORY: No shortness  of breath, cough or sputum.  GASTROINTESTINAL: No anorexia, nausea, vomiting or diarrhea. No abdominal pain or blood.  GENITOURINARY: No burning on urination, no polyuria NEUROLOGICAL: No headache, dizziness, syncope, paralysis, ataxia, numbness or tingling in the extremities. No change in bowel or bladder control.  MUSCULOSKELETAL: No muscle, back pain, joint pain or stiffness.  LYMPHATICS: No enlarged nodes. No history of splenectomy.  PSYCHIATRIC: No history of depression or anxiety.  ENDOCRINOLOGIC: No reports of sweating, cold or heat intolerance. No polyuria or polydipsia.  Marland Kitchen   Physical Examination Today's Vitals   11/05/22 0839  BP: 110/76  Pulse: 77  SpO2: 98%  Weight: 132 lb 9.6 oz (60.1 kg)  Height: 5\' 1"  (1.549 m)   Body mass index is 25.05 kg/m.  Gen: resting comfortably, no acute distress HEENT: no scleral icterus, pupils equal round and reactive, no palptable cervical adenopathy,  CV: irreg, 2/6 systolic murmur apex, no jvd Resp: Clear to auscultation bilaterally GI: abdomen is soft, non-tender, non-distended, normal bowel sounds, no hepatosplenomegaly MSK: extremities are warm, no edema.  Skin: warm, no rash Neuro:  no focal deficits Psych: appropriate affect      Assessment and Plan   1. AFib/Aflutter/acquired thrombophlia - management limited due to sinus node dysfunction, prior bradycardia on amio. May need pacemaker at some point, followed by ep  - doing well on low dose lopressor, continue current therapy - we will d/c coumadin, start eliquis 5mg  bid. Stop coumadin today, start eliquis 5mg  bid on Thursday.    2. Mitral regurgitation/Mitral valve repair - s/p mitral valve repair - no recent symptoms, continue to monitor     3. Chronic diastolic HF - doing well without symptoms, she is euvolemic  - continue prn lasix  4. HTN -at goal, continue current meds    Antoine Poche, M.D.

## 2022-11-07 NOTE — Patient Instructions (Signed)

## 2022-11-08 ENCOUNTER — Ambulatory Visit: Payer: Medicare Other | Admitting: Nurse Practitioner

## 2022-11-08 ENCOUNTER — Encounter: Payer: Self-pay | Admitting: Nurse Practitioner

## 2022-11-08 VITALS — BP 135/79 | HR 86 | Ht 61.0 in | Wt 131.0 lb

## 2022-11-08 DIAGNOSIS — E032 Hypothyroidism due to medicaments and other exogenous substances: Secondary | ICD-10-CM | POA: Diagnosis not present

## 2022-11-08 MED ORDER — LEVOTHYROXINE SODIUM 75 MCG PO TABS
ORAL_TABLET | ORAL | 3 refills | Status: DC
Start: 2022-11-08 — End: 2023-11-08

## 2022-11-08 NOTE — Progress Notes (Signed)
11/08/2022, 8:34 AM                 Endocrinology follow-up   Subjective:    Patient ID: [name] [name], female    DOB: 20-Sep-1940, PCP Golden Pop, FNP   Past Medical History:  Diagnosis Date   Anxiety    Arthritis    Asthma    Atrial fibrillation, persistent ([name])    Chronic diastolic congestive heart failure ([name])    Colon adenoma    Colon cancer ([name]) 1999   status post low anterior resection, limited stage disease requiring no adjuvant therapy   COPD (chronic obstructive pulmonary disease) ([name])    Depression    Diverticulosis    DM (dermatomyositis)    DVT (deep venous thrombosis) ([name])    in leg- long time ago   Dyspnea    with activity   Dysrhythmia    Esophageal dysphagia    GERD (gastroesophageal reflux disease)    Headache    Heart murmur    Hematuria    Hemorrhoids    Hiatal hernia    History of kidney stones    x 2   Hypercholesterolemia    Incidental pulmonary nodule 05/08/2016   8 mm vague opacity RML noted on CT scan   Mitral regurgitation    PONV (postoperative nausea and vomiting) 2003 ish    with breast biopsy   S/P minimally invasive maze operation for atrial fibrillation 07/05/2016   Complete bilateral atrial lesion set using cryothermy and bipolar radiofrequency ablation with clipping of LA appendage via right mini thoracotomy approach   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/05/2016   Complex valvuloplasty including artificial Gore-tex neochord placement x6 and 30 mm Sorin Memo 3D ring annuloplasty via right mini thoracotomy approach   Schatzki's ring    Tricuspid regurgitation    Past Surgical History:  Procedure Laterality Date   APPENDECTOMY     BREAST SURGERY Right 2003ish   biopsy   CARDIAC CATHETERIZATION N/A 04/12/2016   Procedure: Right/Left Heart Cath and Coronary Angiography;  Surgeon: Marykay Lex, MD;  Location: [facility] INVASIVE CV LAB;  Service:  Cardiovascular;  Laterality: N/A;   CARDIOVERSION N/A 08/16/2017   Procedure: CARDIOVERSION;  Surgeon: Antoine Poche, MD;  Location: [name];  Service: Endoscopy;  Laterality: N/A;   CATARACT EXTRACTION Bilateral 2017   CLIPPING OF ATRIAL APPENDAGE  07/05/2016   Procedure: CLIPPING OF ATRIAL APPENDAGE;  Surgeon: Purcell Nails, MD;  Location: [facility] OR;  Service: Open Heart Surgery;;   COLONOSCOPY  02/2008   Dr. Jena Gauss- external hemorrhoidal tags o/w normal rectal mucosa, s/p surgical resection with a normal appearing anastomosis 12cm, pan colonic diverticulum   COLONOSCOPY N/A 06/02/2012   ZOX:[id number] post low anterior resection. Pancolonic diverticulosis. Colonic polyp-tubular adenoma. Surveillance due 2019.    COLONOSCOPY N/A 10/13/2015   Procedure: COLONOSCOPY;  Surgeon: Corbin Ade, MD;  Location: [name];  Service: Endoscopy;  Laterality: N/A;  0930   COLONOSCOPY WITH PROPOFOL N/A 11/20/2018   Procedure: COLONOSCOPY WITH PROPOFOL;  Surgeon: Corbin Ade, MD;  Location: [name];  Service: Endoscopy;  Laterality: N/A;  2:30pm   ESOPHAGOGASTRODUODENOSCOPY  07/2007   Dr. Carron Curie cervical esophageal web, schatzki  ring, large hiatal hernia   ESOPHAGOGASTRODUODENOSCOPY (EGD) WITH PROPOFOL N/A 11/20/2018   Procedure: ESOPHAGOGASTRODUODENOSCOPY (EGD) WITH PROPOFOL;  Surgeon: Corbin Ade, MD;  [name];  Service: Endoscopy;  Laterality: N/A;   INGUNAL HERNIA REPAIR Left    LEG SKIN LESION  BIOPSY / EXCISION     LOW ANTERIOR BOWEL RESECTION  1999   NO ADJ CHEMO   MINIMALLY INVASIVE MAZE PROCEDURE N/A 07/05/2016   Procedure: MINIMALLY INVASIVE MAZE PROCEDURE;  Surgeon: Purcell Nails, MD;  Location: [facility] OR;  Service: Open Heart Surgery;  Laterality: N/A;   MITRAL VALVE REPAIR Right 07/05/2016   Procedure: MINIMALLY INVASIVE MITRAL VALVE REPAIR (MVR);  Surgeon: Purcell Nails, MD;  Location: [facility] OR;  Service: Open Heart Surgery;  Laterality: Right;    MULTIPLE EXTRACTIONS WITH ALVEOLOPLASTY N/A 05/21/2016   Procedure: MULTIPLE EXTRACTION OF TOOTH #'S 5, 21 WITH ALVEOLOPLASTY AND GROSS DEBRIDEMENT OF TEETH;  Surgeon: Charlynne Pander, DDS;  Location: [facility] OR;  Service: Oral Surgery;  Laterality: N/A;   POLYPECTOMY  11/20/2018   Procedure: POLYPECTOMY;  Surgeon: Corbin Ade, MD;  [name];  Service: Endoscopy;;  colon   TEE WITHOUT CARDIOVERSION N/A 02/20/2016   Procedure: TRANSESOPHAGEAL ECHOCARDIOGRAM (TEE);  Surgeon: Antoine Poche, MD;  [name];  Service: Endoscopy;  Laterality: N/A;   TEE WITHOUT CARDIOVERSION N/A 07/05/2016   Procedure: TRANSESOPHAGEAL ECHOCARDIOGRAM (TEE);  Surgeon: Purcell Nails, MD;  Location: [facility] OR;  Service: Open Heart Surgery;  Laterality: N/A;   TOOTH EXTRACTION     TUBAL LIGATION     VEIN LIGATION AND STRIPPING     Social History   Socioeconomic History   Marital status: Married    Spouse name: Not on file   Number of children: 2   Years of education: Not on file   Highest education level: Not on file  Occupational History   Not on file  Tobacco Use   Smoking status: Never    Passive exposure: Past   Smokeless tobacco: Never  Vaping Use   Vaping status: Never Used  Substance and Sexual Activity   Alcohol use: No    Alcohol/week: 0.0 standard drinks of alcohol   Drug use: No   Sexual activity: Not Currently  Other Topics Concern   Not on file  Social History Narrative   Not on file   Social Determinants of Health   Financial Resource Strain: Low Risk  (12/06/2017)   Received from [facility]   Overall Financial Resource Strain (CARDIA)    Difficulty of Paying Living Expenses: Not hard at all  Food Insecurity: No Food Insecurity (12/06/2017)   Received from [facility], [facility]   Hunger Vital Sign    Worried About Running Out of Food in the Last Year: Never true    Ran Out of Food in the Last Year: Never true   Transportation Needs: No Transportation Needs (12/06/2017)   [facility], [facility]   PRAPARE - Transportation    Lack of Transportation (Medical): No    Lack of Transportation (Non-Medical): No  Physical Activity: Inactive (12/06/2017)   Received from [facility], [facility]   Exercise Vital Sign    Days of Exercise per Week: 0 days    Minutes of Exercise per Session: 0 min  Stress: No Stress Concern Present (12/06/2017)   Received from [facility], [facility]  [employer] of Occupational Health - Occupational Stress Questionnaire    Feeling of Stress : Not at all  Social Connections: Moderately Integrated (12/06/2017)   Received from [facility], [facility]   Social Connection and Isolation Panel [NHANES]    Frequency of Communication with Friends and Family: More than three times a week    Frequency of Social Gatherings with Friends and Family: Once a week    Attends Religious Services: 1 to 4 times per year    Active Member of [employer] or Organizations: No    Attends Banker Meetings: Never    Marital Status: Married   Outpatient Encounter Medications as of 11/08/2022  Medication Sig   acetaminophen (TYLENOL) 500 MG tablet Take 1,000 mg by mouth as needed for moderate pain or headache.   albuterol (PROVENTIL) (2.5 MG/3ML) 0.083% nebulizer solution Take 2.5 mg by nebulization every 6 (six) hours as needed for wheezing or shortness of breath.   albuterol (VENTOLIN HFA) 108 (90 Base) MCG/ACT inhaler Inhale 2 puffs into the lungs every 6 (six) hours as needed for wheezing or shortness of breath.   ALPRAZolam (XANAX) 0.5 MG tablet Take 0.5 mg by mouth at bedtime.   amLODipine (NORVASC) 2.5 MG tablet Take 2.5 mg by mouth at bedtime.   apixaban (ELIQUIS) 5 MG TABS tablet Take 1 tablet (5 mg total) by mouth 2 (two) times daily.   Azelastine HCl (ASTEPRO) 0.15 % SOLN Place 1 spray into the nose daily.   calcium carbonate  (OS-CAL) 600 MG TABS tablet Take 600 mg by mouth 2 (two) times daily with a meal.   Coenzyme Q10 100 MG TABS Take 100 mg by mouth every evening.    FLUoxetine (PROZAC) 20 MG capsule Take 20 mg by mouth daily.   fluticasone (FLONASE) 50 MCG/ACT nasal spray Place 1 spray into both nostrils daily.   furosemide (LASIX) 20 MG tablet Take 1 tablet (20 mg total) by mouth as needed (swelling).   hydrocortisone (ANUSOL-HC) 2.5 % rectal cream Place 1 Application rectally 2 (two) times daily.   levocetirizine (XYZAL) 5 MG tablet Take 5 mg by mouth daily.   metoprolol tartrate (LOPRESSOR) 25 MG tablet Take 0.5 tablets (12.5 mg total) by mouth 2 (two) times daily. (May take an additional 12.5mg  tab as needed.)   montelukast (SINGULAIR) 10 MG tablet Take 1 tablet (10 mg total) by mouth at bedtime.   Multiple Vitamin (MULTIVITAMIN) tablet Take 1 tablet by mouth daily.   pantoprazole (PROTONIX) 40 MG tablet TAKE 1 TABLET(40 MG) BY MOUTH DAILY   simvastatin (ZOCOR) 40 MG tablet Take 40 mg daily by mouth.   [DISCONTINUED] levothyroxine (SYNTHROID) 75 MCG tablet TAKE 1/2 TABLET(37.5 MCG) BY MOUTH DAILY BEFORE BREAKFAST   levothyroxine (SYNTHROID) 75 MCG tablet TAKE 1/2 TABLET(37.5 MCG) BY MOUTH DAILY BEFORE BREAKFAST   No facility-administered encounter medications on file as of 11/08/2022.   ALLERGIES: Allergies  Allergen Reactions   Iohexol Hives, Shortness Of Breath and Other (See Comments)     Pt. had a severe allergic reaction to IV contrast the last time she was injected and had to be seen in the ER.    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 ) visit. Patient reports no anxiety, cold intolerance, constipation, depressed mood, diarrhea, fatigue, hair loss, heat intolerance, leg swelling, palpitations, tremors, weight gain or weight loss. The symptoms have been stable.   [name] [name] is 82 y.o. female who is being seen in follow-up hypothyroidism.   -She denies weight loss, tremors, or heat and cold intolerance.    -She is on multiple medications including anticoagulation with warfarin related to her cardiac surgery for tricuspid regurgitation as well as atrial fibrillation. -Her current medication list does not include active amiodarone.   Review of systems  Constitutional: + Minimally fluctuating body weight,  current Body mass index is 24.75 kg/m. , no fatigue, no subjective hyperthermia, no subjective hypothermia Eyes: no blurry vision, no xerophthalmia ENT: no sore throat, no nodules palpated in throat, no dysphagia/odynophagia, no hoarseness Cardiovascular: no chest pain, no shortness of breath, intermittent palpitations- stable (hx afib), no leg swelling Respiratory: no cough, no shortness of breath Gastrointestinal: no nausea/vomiting/diarrhea Musculoskeletal: no muscle/joint aches Skin: no rashes, no hyperemia Neurological: no tremors, no numbness, no tingling, no dizziness Psychiatric: no depression, no anxiety    Objective:    BP 135/79 (BP Location: Left Arm, Patient Position: Sitting, Cuff Size: Normal)   Pulse 86   Ht 5\' 1"  (1.549 m)   Wt 131 lb (59.4 kg)   BMI 24.75 kg/m   Wt Readings from Last 3 Encounters:  11/08/22 131 lb (59.4 kg)  11/05/22 132 lb 9.6 oz (60.1 kg)  08/23/22 138 lb (62.6 kg)   BP Readings from Last 3 Encounters:  11/08/22 135/79  11/05/22  110/76  08/23/22 132/83     Physical Exam- Limited  Constitutional:  Body mass index is 24.75 kg/m. , not in acute distress, normal state of mind Eyes:  EOMI, no exophthalmos Musculoskeletal: no gross deformities, strength intact in all four extremities, no gross restriction of joint movements Skin:  no rashes, no hyperemia Neurological: no tremor with outstretched hands,    CMP     Component Value Date/Time   NA 144 08/12/2017 1409   K 3.8 08/12/2017 1409   CL 100 (L) 08/12/2017 1409   CO2 27 08/12/2017 1409   GLUCOSE 96 08/12/2017 1409   BUN 15 08/12/2017 1409   CREATININE 1.01 (H) 08/12/2017 1409   CALCIUM 9.2 08/12/2017 1409   PROT 6.7 07/03/2016 1157   ALBUMIN 3.8 07/03/2016 1157   AST 23 07/03/2016 1157   ALT 19 07/03/2016 1157   ALKPHOS 113 07/03/2016 1157   BILITOT 0.5 07/03/2016 1157   GFRNONAA 52 (L) 08/12/2017 1409   GFRAA >60 08/12/2017 1409    Diabetic Labs (most recent): Lab Results  Component Value Date   HGBA1C 5.6 07/03/2016    Lab Results  Component Value Date   TSH 1.400 11/02/2022   TSH 2.334 10/17/2021   TSH 1.105 09/13/2020   TSH 1.065 03/14/2020   TSH 1.141 11/13/2019   TSH 2.202 05/13/2019   TSH 2.146 01/07/2019   TSH 0.084 (L) 09/29/2018   TSH 0.311 (L) 06/30/2018   TSH 2.700 03/13/2017   FREET4 1.45 11/02/2022   FREET4 1.31 (H) 10/17/2021   FREET4 0.95 09/13/2020   FREET4 0.99 03/14/2020   FREET4 1.13 (H) 11/13/2019   FREET4 0.95 05/13/2019   FREET4 1.09 01/07/2019  FREET4 1.27 09/29/2018   FREET4 1.23 06/30/2018    Recent Results (from the past 2160 hour(s))  POCT INR     Status: Abnormal   Collection Time: 09/03/22  2:30 PM  Result Value Ref Range   INR 3.7 (A) 2.0 - 3.0   POC INR    POCT INR     Status: Abnormal   Collection Time: 09/24/22  2:09 PM  Result Value Ref Range   INR 1.4 (A) 2.0 - 3.0   POC INR    POCT INR     Status: Abnormal   Collection Time: 10/08/22  2:24 PM  Result Value Ref Range   INR 3.1  (A) 2.0 - 3.0   POC INR    POCT INR     Status: Normal   Collection Time: 10/22/22 11:23 AM  Result Value Ref Range   INR 2.3 2.0 - 3.0   POC INR    TSH     Status: None   Collection Time: 11/02/22  2:13 PM  Result Value Ref Range   TSH 1.400 0.450 - 4.500 uIU/mL  T4, free     Status: None   Collection Time: 11/02/22  2:13 PM  Result Value Ref Range   Free T4 1.45 0.82 - 1.77 ng/dL    Latest Reference Range & Units 03/14/20 14:53 09/13/20 14:00 09/13/20 14:01 10/17/21 11:22 11/02/22 14:13  TSH 0.450 - 4.500 uIU/mL 1.065 1.105  2.334 1.400  T4,Free(Direct) 0.82 - 1.77 ng/dL 5.28  4.13 2.44 (H) 0.10  (H): Data is abnormally high  Assessment & Plan:   1.  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  She is advised to continue Levothyroxine 37.5 mcg po daily before breakfast.   - We discussed about the correct intake of her thyroid hormone, on empty stomach at fasting, with water, separated by at least 30 minutes from breakfast and other medications,  and separated by more than 4 hours from calcium, iron, multivitamins, acid reflux medications (PPIs). -Patient is made aware of the fact that thyroid hormone replacement is needed for life, dose to be adjusted by periodic monitoring of thyroid function tests.   - I advised her  to maintain close follow up with Golden Pop, FNP for primary care needs.  I encouraged her to resume her follow-up with her cardiologist regarding her history of atrial fibrillation which required treatment with amiodarone.    I spent  14  minutes in the care of the patient today including review of labs from Thyroid Function, CMP, and other relevant labs ; imaging/biopsy records (current and previous including abstractions from other facilities); face-to-face time discussing  her lab results and symptoms, medications doses, her options of short and long term  treatment based on the latest standards of care / guidelines;   and documenting the encounter.  [name] [name]  participated in the discussions, expressed understanding, and voiced agreement with the above plans.  All questions were answered to her satisfaction. she is encouraged to contact clinic should she have any questions or concerns prior to her return visit.  Follow up plan: Return in about 1 year (around 11/08/2023) for Thyroid follow up, Previsit labs.   Ronny Bacon, [facility] [address] [place], [place] [address] Phone: [phone] Fax: [phone]    11/08/2022, 8:34 AM

## 2022-11-12 DIAGNOSIS — I4891 Unspecified atrial fibrillation: Secondary | ICD-10-CM | POA: Diagnosis not present

## 2022-11-12 DIAGNOSIS — Z299 Encounter for prophylactic measures, unspecified: Secondary | ICD-10-CM | POA: Diagnosis not present

## 2022-11-12 DIAGNOSIS — N390 Urinary tract infection, site not specified: Secondary | ICD-10-CM | POA: Diagnosis not present

## 2022-11-19 DIAGNOSIS — J189 Pneumonia, unspecified organism: Secondary | ICD-10-CM | POA: Diagnosis not present

## 2022-11-19 DIAGNOSIS — R220 Localized swelling, mass and lump, head: Secondary | ICD-10-CM | POA: Diagnosis not present

## 2022-11-19 DIAGNOSIS — F32A Depression, unspecified: Secondary | ICD-10-CM | POA: Diagnosis not present

## 2022-11-19 DIAGNOSIS — J449 Chronic obstructive pulmonary disease, unspecified: Secondary | ICD-10-CM | POA: Diagnosis not present

## 2022-11-19 DIAGNOSIS — I517 Cardiomegaly: Secondary | ICD-10-CM | POA: Diagnosis not present

## 2022-11-19 DIAGNOSIS — Z885 Allergy status to narcotic agent status: Secondary | ICD-10-CM | POA: Diagnosis not present

## 2022-11-19 DIAGNOSIS — T7840XA Allergy, unspecified, initial encounter: Secondary | ICD-10-CM | POA: Diagnosis not present

## 2022-11-19 DIAGNOSIS — K219 Gastro-esophageal reflux disease without esophagitis: Secondary | ICD-10-CM | POA: Diagnosis not present

## 2022-11-19 DIAGNOSIS — F419 Anxiety disorder, unspecified: Secondary | ICD-10-CM | POA: Diagnosis not present

## 2022-11-19 DIAGNOSIS — K449 Diaphragmatic hernia without obstruction or gangrene: Secondary | ICD-10-CM | POA: Diagnosis not present

## 2022-11-19 DIAGNOSIS — I5032 Chronic diastolic (congestive) heart failure: Secondary | ICD-10-CM | POA: Diagnosis not present

## 2022-11-19 DIAGNOSIS — R062 Wheezing: Secondary | ICD-10-CM | POA: Diagnosis not present

## 2022-11-19 DIAGNOSIS — Z91041 Radiographic dye allergy status: Secondary | ICD-10-CM | POA: Diagnosis not present

## 2022-11-19 DIAGNOSIS — R918 Other nonspecific abnormal finding of lung field: Secondary | ICD-10-CM | POA: Diagnosis not present

## 2022-11-19 DIAGNOSIS — I110 Hypertensive heart disease with heart failure: Secondary | ICD-10-CM | POA: Diagnosis not present

## 2022-11-23 DIAGNOSIS — R220 Localized swelling, mass and lump, head: Secondary | ICD-10-CM | POA: Diagnosis not present

## 2022-11-23 DIAGNOSIS — J189 Pneumonia, unspecified organism: Secondary | ICD-10-CM | POA: Diagnosis not present

## 2022-11-23 DIAGNOSIS — Z299 Encounter for prophylactic measures, unspecified: Secondary | ICD-10-CM | POA: Diagnosis not present

## 2022-11-23 DIAGNOSIS — I5032 Chronic diastolic (congestive) heart failure: Secondary | ICD-10-CM | POA: Diagnosis not present

## 2022-11-23 DIAGNOSIS — I10 Essential (primary) hypertension: Secondary | ICD-10-CM | POA: Diagnosis not present

## 2022-11-28 DIAGNOSIS — T25229A Burn of second degree of unspecified foot, initial encounter: Secondary | ICD-10-CM | POA: Diagnosis not present

## 2022-11-28 DIAGNOSIS — D692 Other nonthrombocytopenic purpura: Secondary | ICD-10-CM | POA: Diagnosis not present

## 2022-11-28 DIAGNOSIS — Z299 Encounter for prophylactic measures, unspecified: Secondary | ICD-10-CM | POA: Diagnosis not present

## 2022-11-28 DIAGNOSIS — I10 Essential (primary) hypertension: Secondary | ICD-10-CM | POA: Diagnosis not present

## 2022-12-03 DIAGNOSIS — H3561 Retinal hemorrhage, right eye: Secondary | ICD-10-CM | POA: Diagnosis not present

## 2022-12-04 ENCOUNTER — Ambulatory Visit (INDEPENDENT_AMBULATORY_CARE_PROVIDER_SITE_OTHER): Payer: Medicare Other | Admitting: Pulmonary Disease

## 2022-12-04 ENCOUNTER — Encounter: Payer: Self-pay | Admitting: Pulmonary Disease

## 2022-12-04 ENCOUNTER — Ambulatory Visit (HOSPITAL_COMMUNITY)
Admission: RE | Admit: 2022-12-04 | Discharge: 2022-12-04 | Disposition: A | Discharge status: Home / Self Care | Payer: Medicare Other | Source: Ambulatory Visit | Attending: Pulmonary Disease | Admitting: Pulmonary Disease

## 2022-12-04 VITALS — BP 130/74 | HR 89 | Ht 61.0 in | Wt 127.0 lb

## 2022-12-04 DIAGNOSIS — Z8701 Personal history of pneumonia (recurrent): Secondary | ICD-10-CM | POA: Diagnosis not present

## 2022-12-04 DIAGNOSIS — J301 Allergic rhinitis due to pollen: Secondary | ICD-10-CM | POA: Diagnosis not present

## 2022-12-04 DIAGNOSIS — J4489 Other specified chronic obstructive pulmonary disease: Secondary | ICD-10-CM | POA: Diagnosis not present

## 2022-12-04 DIAGNOSIS — J449 Chronic obstructive pulmonary disease, unspecified: Secondary | ICD-10-CM | POA: Diagnosis not present

## 2022-12-04 MED ORDER — BREZTRI AEROSPHERE 160-9-4.8 MCG/ACT IN AERO: 2.0000 | INHALATION_SPRAY | Freq: Two times a day (BID) | RESPIRATORY_TRACT | Status: DC

## 2022-12-04 MED ORDER — BREZTRI AEROSPHERE 160-9-4.8 MCG/ACT IN AERO: 2.0000 | INHALATION_SPRAY | Freq: Two times a day (BID) | RESPIRATORY_TRACT | 5 refills | Status: DC

## 2022-12-04 NOTE — Patient Instructions (Signed)
Breztri two puffs in the morning and two puffs in the evening, and rinse your mouth after each use.  Chest xray today at [facility].  Follow up in 6 months.

## 2022-12-04 NOTE — Progress Notes (Signed)
[place] [facility]  Chief Complaint  Patient presents with   Follow-up    Increased SOB and wheezing past several weeks. She states she was dx with PNA end of July 2024- seen at [facility]- tx with augmentin.     Past Surgical History:  She  has a past surgical history that includes Colonoscopy (02/2008); INGUNAL HERNIA REPAIR (Left); Tubal ligation; Low anterior bowel resection (1999); Appendectomy; Vein ligation and stripping; Esophagogastroduodenoscopy (07/2007); Colonoscopy (N/A, 06/02/2012); Colonoscopy (N/A, 10/13/2015); TEE without cardioversion (N/A, 02/20/2016); Cardiac catheterization (N/A, 04/12/2016); Cataract extraction (Bilateral, 2017); Breast surgery (Right, 2003ish); Multiple extractions with alveoloplasty (N/A, 05/21/2016); Tooth extraction; Mitral valve repair (Right, 07/05/2016); Minimally invasive maze procedure (N/A, 07/05/2016); TEE without cardioversion (N/A, 07/05/2016); Clipping of atrial appendage (07/05/2016); Cardioversion (N/A, 08/16/2017); Colonoscopy with propofol (N/A, 11/20/2018); Esophagogastroduodenoscopy (egd) with propofol (N/A, 11/20/2018); polypectomy (11/20/2018); and Leg skin lesion  biopsy / excision.  Past Medical History:  Anxiety, OA, A fib s/p MAZE, Diastolic CHF, Colon polyp, Colon cancer 1999, Depression, Diverticulosis, DM type 2, DVT in leg, Esophageal stricture, GERD, Large Hiatal hernia, Nephrolithiasis, HLD, Mitral regurgitation, Schatzki's ring  Constitutional:  BP 130/74 (BP Location: Left Arm, Cuff Size: Normal)   Pulse 89   Ht 5\' 1"  (1.549 m)   Wt 127 lb (57.6 kg)   SpO2 97% Comment: on RA  BMI 24.00 kg/m   Brief Summary:  [name] [name] is a 82 y.o. female with COPD with asthma and allergies.      Subjective:   She was in [facility] in July.  Found to have RML pneumonia.  Started on augmentin and doxycycline.  Feels better.  Still has cough with clear to yellow sputum.  Wheezing more in  rainy weather.  Not having sinus congestion, sore throat, fever, chest pain, or leg swelling.  No GI symptoms.  Sleeping okay.  Didn't realize she needed to refill breztri.  This helped when she used it.  Physical Exam:   Appearance - well kempt   ENMT - no sinus tenderness, no oral exudate, no LAN, Mallampati 3 airway, no stridor  Respiratory - equal breath sounds bilaterally, no wheezing or rales  CV - s1s2 regular rate and rhythm, 2/6  Ext - no clubbing, no edema  Skin - no rashes  Psych - normal mood and affect      Pulmonary testing:  PFT 05/11/16 >> FEV1 1.47 (77%), FEV1% 68, TLC 4.84 (101%), DLCO 65%, +BD  Chest imaging:  CT chest 12/17/17 >> large hiatal hernia, small effusions, changes of cirrhosis CT sinus 12/26/20 >> clear sinuses  Cardiac Tests:  Echo 10/26/16 >> EF 60 to 65%, s/p MV valvuloplasty, PASP 43 mmHg  Social History:  She  reports that she has never smoked. She has been exposed to tobacco smoke. She has never used smokeless tobacco. She reports that she does not drink alcohol and does not use drugs.  Family History:  Her family history includes Aneurysm in her brother; Brain cancer in her sister; Breast cancer in her mother; Cancer - Lung in her sister; Prostate cancer in her brother; Rheum arthritis in her father.     Assessment/Plan:   COPD with asthma. - restart breztri two puffs bid; samples provided - continue singulair 10 mg at bedtime  - continue singulair 10 mg nightly - she has a nebulizer machine  - prn albuterol  Seasonal allergic rhinitis. - continue singulair - prn flonase, xyzal, azelastine  History of right middle lobe pneumonia. -  from July 2024 - repeat chest xray today  Atrial fibrillation/flutter, s/p MVR, Chronic diastolic CHF, Tachycardia. - followed by Dr. Dina Rich with [facility]  Time Spent Involved in Patient Care on Day of Examination:  35 minutes  Follow up:   Patient Instructions  Breztri two  puffs in the morning and two puffs in the evening, and rinse your mouth after each use.  Chest xray today at [facility].  Follow up in 6 months.  Medication List:   Allergies as of 12/04/2022       Reactions   Iohexol Hives, Shortness Of Breath, Other (See Comments)    Pt. had a severe allergic reaction to IV contrast the last time she was injected and had to be seen in the ER.    Hydrocodone-acetaminophen Hives   Nabumetone Rash   Prednisone Rash        Medication List        Accurate as of December 04, 2022  9:15 AM. If you have any questions, ask your nurse or doctor.          acetaminophen 500 MG tablet Commonly known as: TYLENOL Take 1,000 mg by mouth as needed for moderate pain or headache.   albuterol (2.5 MG/3ML) 0.083% nebulizer solution Commonly known as: PROVENTIL Take 2.5 mg by nebulization every 6 (six) hours as needed for wheezing or shortness of breath.   albuterol 108 (90 Base) MCG/ACT inhaler Commonly known as: Ventolin HFA Inhale 2 puffs into the lungs every 6 (six) hours as needed for wheezing or shortness of breath.   ALPRAZolam 0.5 MG tablet Commonly known as: XANAX Take 0.5 mg by mouth at bedtime.   amLODipine 2.5 MG tablet Commonly known as: NORVASC Take 2.5 mg by mouth at bedtime.   apixaban 5 MG Tabs tablet Commonly known as: ELIQUIS Take 1 tablet (5 mg total) by mouth 2 (two) times daily.   Azelastine HCl 0.15 % Soln Commonly known as: Astepro Place 1 spray into the nose daily.   Breztri Aerosphere 160-9-4.8 MCG/ACT Aero Generic drug: Budeson-Glycopyrrol-Formoterol Inhale 2 puffs into the lungs in the morning and at bedtime. Started by: Caralyn Guile Aerosphere 160-9-4.8 MCG/ACT Aero Generic drug: Budeson-Glycopyrrol-Formoterol Inhale 2 puffs into the lungs in the morning and at bedtime. Started by: Coralyn Helling   calcium carbonate 600 MG Tabs tablet Commonly known as: OS-CAL Take 600 mg by mouth 2 (two)  times daily with a meal.   Coenzyme Q10 100 MG Tabs Take 100 mg by mouth every evening.   FLUoxetine 20 MG capsule Commonly known as: PROZAC Take 20 mg by mouth daily.   fluticasone 50 MCG/ACT nasal spray Commonly known as: FLONASE Place 1 spray into both nostrils daily.   furosemide 20 MG tablet Commonly known as: LASIX Take 1 tablet (20 mg total) by mouth as needed (swelling).   hydrocortisone 2.5 % rectal cream Commonly known as: ANUSOL-HC Place 1 Application rectally 2 (two) times daily.   levocetirizine 5 MG tablet Commonly known as: XYZAL Take 5 mg by mouth daily.   levothyroxine 75 MCG tablet Commonly known as: SYNTHROID TAKE 1/2 TABLET(37.5 MCG) BY MOUTH DAILY BEFORE BREAKFAST   metoprolol tartrate 25 MG tablet Commonly known as: LOPRESSOR Take 0.5 tablets (12.5 mg total) by mouth 2 (two) times daily. (May take an additional 12.5mg  tab as needed.)   montelukast 10 MG tablet Commonly known as: SINGULAIR Take 1 tablet (10 mg total) by mouth at bedtime.   multivitamin  tablet Take 1 tablet by mouth daily.   pantoprazole 40 MG tablet Commonly known as: PROTONIX TAKE 1 TABLET(40 MG) BY MOUTH DAILY   simvastatin 40 MG tablet Commonly known as: ZOCOR Take 40 mg daily by mouth.        Signature:  Coralyn Helling, MD [facility]monary/Critical Care Pager - [phone] 12/04/2022, 9:15 AM

## 2022-12-07 DIAGNOSIS — I10 Essential (primary) hypertension: Secondary | ICD-10-CM | POA: Diagnosis not present

## 2022-12-07 DIAGNOSIS — I5032 Chronic diastolic (congestive) heart failure: Secondary | ICD-10-CM | POA: Diagnosis not present

## 2022-12-07 DIAGNOSIS — J441 Chronic obstructive pulmonary disease with (acute) exacerbation: Secondary | ICD-10-CM | POA: Diagnosis not present

## 2022-12-07 DIAGNOSIS — Z299 Encounter for prophylactic measures, unspecified: Secondary | ICD-10-CM | POA: Diagnosis not present

## 2022-12-07 DIAGNOSIS — J189 Pneumonia, unspecified organism: Secondary | ICD-10-CM | POA: Diagnosis not present

## 2022-12-12 ENCOUNTER — Telehealth: Payer: Self-pay | Admitting: Cardiology

## 2022-12-12 NOTE — Telephone Encounter (Signed)
Patient stated that she was in the donut hole with her prescription medications- She needed to pay a deductible of $303 and then her Eliquis will be $147 a month. Offered pt BMS PAF but pt advised that she had already gotten paperwork from [facility]. Pt stated she will fill papers out and drop off at our office.   Pt advised that at this time we do not have Eliquis samples in office, but pt stated she would pick up Eliquis from pharmacy and restart medication. Pt stated she had been without medication for several days.   Will route to provider as FYI.

## 2022-12-12 NOTE — Telephone Encounter (Signed)
Pt c/o medication issue:  1. Name of Medication: apixaban (ELIQUIS) 5 MG TABS tablet   2. How are you currently taking this medication (dosage and times per day)?    3. Are you having a reaction (difficulty breathing--STAT)? no  4. What is your medication issue? Patient is in the donut whole with her insurance. Calling to see what other options are there. Please advise

## 2022-12-14 DIAGNOSIS — R5383 Other fatigue: Secondary | ICD-10-CM | POA: Diagnosis not present

## 2022-12-14 DIAGNOSIS — Z0000 Encounter for general adult medical examination without abnormal findings: Secondary | ICD-10-CM | POA: Diagnosis not present

## 2022-12-14 DIAGNOSIS — Z79899 Other long term (current) drug therapy: Secondary | ICD-10-CM | POA: Diagnosis not present

## 2022-12-14 DIAGNOSIS — E7800 Pure hypercholesterolemia, unspecified: Secondary | ICD-10-CM | POA: Diagnosis not present

## 2022-12-14 DIAGNOSIS — Z299 Encounter for prophylactic measures, unspecified: Secondary | ICD-10-CM | POA: Diagnosis not present

## 2022-12-14 DIAGNOSIS — I10 Essential (primary) hypertension: Secondary | ICD-10-CM | POA: Diagnosis not present

## 2022-12-14 DIAGNOSIS — Z1339 Encounter for screening examination for other mental health and behavioral disorders: Secondary | ICD-10-CM | POA: Diagnosis not present

## 2022-12-14 DIAGNOSIS — Z7189 Other specified counseling: Secondary | ICD-10-CM | POA: Diagnosis not present

## 2022-12-14 DIAGNOSIS — Z1331 Encounter for screening for depression: Secondary | ICD-10-CM | POA: Diagnosis not present

## 2022-12-21 DIAGNOSIS — Z86718 Personal history of other venous thrombosis and embolism: Secondary | ICD-10-CM | POA: Diagnosis not present

## 2022-12-21 DIAGNOSIS — I5032 Chronic diastolic (congestive) heart failure: Secondary | ICD-10-CM | POA: Diagnosis not present

## 2022-12-21 DIAGNOSIS — S40262A Insect bite (nonvenomous) of left shoulder, initial encounter: Secondary | ICD-10-CM | POA: Diagnosis not present

## 2022-12-21 DIAGNOSIS — J449 Chronic obstructive pulmonary disease, unspecified: Secondary | ICD-10-CM | POA: Diagnosis not present

## 2022-12-21 DIAGNOSIS — K219 Gastro-esophageal reflux disease without esophagitis: Secondary | ICD-10-CM | POA: Diagnosis not present

## 2022-12-21 DIAGNOSIS — I110 Hypertensive heart disease with heart failure: Secondary | ICD-10-CM | POA: Diagnosis not present

## 2022-12-21 DIAGNOSIS — I081 Rheumatic disorders of both mitral and tricuspid valves: Secondary | ICD-10-CM | POA: Diagnosis not present

## 2022-12-21 DIAGNOSIS — W57XXXA Bitten or stung by nonvenomous insect and other nonvenomous arthropods, initial encounter: Secondary | ICD-10-CM | POA: Diagnosis not present

## 2023-01-24 ENCOUNTER — Other Ambulatory Visit: Payer: Self-pay | Admitting: Cardiology

## 2023-01-31 DIAGNOSIS — K146 Glossodynia: Secondary | ICD-10-CM | POA: Diagnosis not present

## 2023-01-31 DIAGNOSIS — Z299 Encounter for prophylactic measures, unspecified: Secondary | ICD-10-CM | POA: Diagnosis not present

## 2023-01-31 DIAGNOSIS — J029 Acute pharyngitis, unspecified: Secondary | ICD-10-CM | POA: Diagnosis not present

## 2023-02-14 DIAGNOSIS — I10 Essential (primary) hypertension: Secondary | ICD-10-CM | POA: Diagnosis not present

## 2023-02-14 DIAGNOSIS — R3989 Other symptoms and signs involving the genitourinary system: Secondary | ICD-10-CM | POA: Diagnosis not present

## 2023-02-14 DIAGNOSIS — M810 Age-related osteoporosis without current pathological fracture: Secondary | ICD-10-CM | POA: Diagnosis not present

## 2023-02-14 DIAGNOSIS — Z299 Encounter for prophylactic measures, unspecified: Secondary | ICD-10-CM | POA: Diagnosis not present

## 2023-02-14 DIAGNOSIS — K7460 Unspecified cirrhosis of liver: Secondary | ICD-10-CM | POA: Diagnosis not present

## 2023-02-14 DIAGNOSIS — N390 Urinary tract infection, site not specified: Secondary | ICD-10-CM | POA: Diagnosis not present

## 2023-02-26 DIAGNOSIS — H3561 Retinal hemorrhage, right eye: Secondary | ICD-10-CM | POA: Diagnosis not present

## 2023-03-05 DIAGNOSIS — R0602 Shortness of breath: Secondary | ICD-10-CM | POA: Diagnosis not present

## 2023-03-05 DIAGNOSIS — K7460 Unspecified cirrhosis of liver: Secondary | ICD-10-CM | POA: Diagnosis not present

## 2023-03-05 DIAGNOSIS — F332 Major depressive disorder, recurrent severe without psychotic features: Secondary | ICD-10-CM | POA: Diagnosis not present

## 2023-03-05 DIAGNOSIS — Z299 Encounter for prophylactic measures, unspecified: Secondary | ICD-10-CM | POA: Diagnosis not present

## 2023-03-05 DIAGNOSIS — R350 Frequency of micturition: Secondary | ICD-10-CM | POA: Diagnosis not present

## 2023-03-05 DIAGNOSIS — I10 Essential (primary) hypertension: Secondary | ICD-10-CM | POA: Diagnosis not present

## 2023-03-10 DIAGNOSIS — I5032 Chronic diastolic (congestive) heart failure: Secondary | ICD-10-CM | POA: Diagnosis not present

## 2023-03-10 DIAGNOSIS — K148 Other diseases of tongue: Secondary | ICD-10-CM | POA: Diagnosis not present

## 2023-03-10 DIAGNOSIS — J449 Chronic obstructive pulmonary disease, unspecified: Secondary | ICD-10-CM | POA: Diagnosis not present

## 2023-03-10 DIAGNOSIS — I110 Hypertensive heart disease with heart failure: Secondary | ICD-10-CM | POA: Diagnosis not present

## 2023-03-10 DIAGNOSIS — K449 Diaphragmatic hernia without obstruction or gangrene: Secondary | ICD-10-CM | POA: Diagnosis not present

## 2023-03-10 DIAGNOSIS — R079 Chest pain, unspecified: Secondary | ICD-10-CM | POA: Diagnosis not present

## 2023-03-10 DIAGNOSIS — R220 Localized swelling, mass and lump, head: Secondary | ICD-10-CM | POA: Diagnosis not present

## 2023-03-10 DIAGNOSIS — T783XXA Angioneurotic edema, initial encounter: Secondary | ICD-10-CM | POA: Diagnosis not present

## 2023-03-19 DIAGNOSIS — I10 Essential (primary) hypertension: Secondary | ICD-10-CM | POA: Diagnosis not present

## 2023-03-19 DIAGNOSIS — I5032 Chronic diastolic (congestive) heart failure: Secondary | ICD-10-CM | POA: Diagnosis not present

## 2023-03-19 DIAGNOSIS — Z299 Encounter for prophylactic measures, unspecified: Secondary | ICD-10-CM | POA: Diagnosis not present

## 2023-03-19 DIAGNOSIS — R220 Localized swelling, mass and lump, head: Secondary | ICD-10-CM | POA: Diagnosis not present

## 2023-03-19 DIAGNOSIS — K140 Glossitis: Secondary | ICD-10-CM | POA: Diagnosis not present

## 2023-04-03 ENCOUNTER — Telehealth: Payer: Self-pay | Admitting: *Deleted

## 2023-04-03 NOTE — Patient Outreach (Signed)
  Care Coordination   04/03/2023 Name: [name] [name] MRN: [id number] DOB: [birth year]   Care Coordination Outreach Attempts:  An unsuccessful telephone outreach was attempted today to offer the patient information about available care coordination services.  Follow Up Plan:  Additional outreach attempts will be made to offer the patient care coordination information and services.   Encounter Outcome:  No Answer   Care Coordination Interventions:  No, not indicated    Demetrios Loll, RN, BSN Care Manager [place]  [facility]  Population Health  Direct Dial: [phone] Main #: [phone]

## 2023-04-09 DIAGNOSIS — R52 Pain, unspecified: Secondary | ICD-10-CM | POA: Diagnosis not present

## 2023-04-09 DIAGNOSIS — Z299 Encounter for prophylactic measures, unspecified: Secondary | ICD-10-CM | POA: Diagnosis not present

## 2023-04-09 DIAGNOSIS — R0602 Shortness of breath: Secondary | ICD-10-CM | POA: Diagnosis not present

## 2023-04-09 DIAGNOSIS — M5432 Sciatica, left side: Secondary | ICD-10-CM | POA: Diagnosis not present

## 2023-04-26 ENCOUNTER — Ambulatory Visit: Payer: Medicare Other | Attending: Cardiology | Admitting: Cardiology

## 2023-04-26 VITALS — BP 118/74 | HR 100 | Ht 61.0 in | Wt 119.2 lb

## 2023-04-26 DIAGNOSIS — I10 Essential (primary) hypertension: Secondary | ICD-10-CM | POA: Diagnosis not present

## 2023-04-26 DIAGNOSIS — I4819 Other persistent atrial fibrillation: Secondary | ICD-10-CM | POA: Insufficient documentation

## 2023-04-26 DIAGNOSIS — I5032 Chronic diastolic (congestive) heart failure: Secondary | ICD-10-CM | POA: Insufficient documentation

## 2023-04-26 DIAGNOSIS — E782 Mixed hyperlipidemia: Secondary | ICD-10-CM | POA: Insufficient documentation

## 2023-04-26 DIAGNOSIS — I340 Nonrheumatic mitral (valve) insufficiency: Secondary | ICD-10-CM | POA: Insufficient documentation

## 2023-04-26 MED ORDER — APIXABAN 2.5 MG PO TABS: 2.5000 mg | ORAL_TABLET | Freq: Two times a day (BID) | ORAL | 6 refills | Status: DC

## 2023-04-26 NOTE — Progress Notes (Signed)
 Clinical Summary [name] [name] is a 83 y.o.female seen today for follow up of the following medical problem.s      1. Paroxysmal afib/ Aflutter - s/p MAZE procedure during MV repair - management complicated by sinus node dysfunction. Has affected ability to take av nodal agents. Significant brady on amio, now off.    - 03/2019 event monitor with just SR, rare PACs   -no recent symptoms - compliant with meds -EKG today shows afib at 100 - no significant bleeding on eliquis .     2 Sinus node dysfunction - followed by EP, watchful waiting. Likely will require ppm at some point - no recent symptoms.      3. Mitral regurgitation, s/p MV repair - echo at last [facility] Jan 2017 showed normal LVEF, 3+(modarte) eccentric MR with immobile posterior leaflet.  - repeat echo 01/2016 moderate to severe MR. LVEF 60-65%. LVIDs 31. Symptoms unchanged since last visit. - 01/2016 TEE moderate to severe eccentric MR, severe TR.  - she is s/p mitral valve repair 07/05/16, Sorin Memo 3D Ring Annuloplasty (size 30mm, catalog #[id number], serial [id number])  - repeat echo 10/2016 with LVEF 60-65%, no WMAs, normal MV repair and ring, moderate TR.      - no recent SOB/DOE. Some wheezing/sinus issues with weather changes, better with prn inhaler.    4. Chronic diastolic HF No recent symptoms, no SOB/DOE - has prn lasix , rarely needs only about once every 1-2 months     5. History of GI bleed - followed by GI - notes indicate prior hemorroidal bleeding       6. COPD - followed by Dr Shellia       7. Venous insufficiency - followed by vascular  8. Angioedema - ER visit 02/2023 with symptoms, has not been on ACE or ARB  8. HLD - 11/2022 TC 136 TG 70 HDL 46 LDL 76 Past Medical History:  Diagnosis Date   Anxiety    Arthritis    Asthma    Atrial fibrillation, persistent ([name])    Chronic diastolic congestive heart failure ([name])    Colon adenoma    Colon cancer ([name]) 1999   status post low  anterior resection, limited stage disease requiring no adjuvant therapy   COPD (chronic obstructive pulmonary disease) ([name])    Depression    Diverticulosis    DM (dermatomyositis)    DVT (deep venous thrombosis) ([name])    in leg- long time ago   Dyspnea    with activity   Dysrhythmia    Esophageal dysphagia    GERD (gastroesophageal reflux disease)    Headache    Heart murmur    Hematuria    Hemorrhoids    Hiatal hernia    History of kidney stones    x 2   Hypercholesterolemia    Incidental pulmonary nodule 05/08/2016   8 mm vague opacity RML noted on CT scan   Mitral regurgitation    PONV (postoperative nausea and vomiting) 2003 ish    with breast biopsy   S/P minimally invasive maze operation for atrial fibrillation 07/05/2016   Complete bilateral atrial lesion set using cryothermy and bipolar radiofrequency ablation with clipping of LA appendage via right mini thoracotomy approach   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/05/2016   Complex valvuloplasty including artificial Gore-tex neochord placement x6 and 30 mm Sorin Memo 3D ring annuloplasty via right mini thoracotomy approach   Schatzki's ring    Tricuspid regurgitation  Allergies  Allergen Reactions   Iohexol Hives, Shortness Of Breath and Other (See Comments)     Pt. had a severe allergic reaction to IV contrast the last time she was injected and had to be seen in the ER.    Hydrocodone-Acetaminophen  Hives   Nabumetone Rash   Prednisone  Rash     Current Outpatient Medications  Medication Sig Dispense Refill   acetaminophen  (TYLENOL ) 500 MG tablet Take 1,000 mg by mouth as needed for moderate pain or headache.     albuterol  (PROVENTIL ) (2.5 MG/3ML) 0.083% nebulizer solution Take 2.5 mg by nebulization every 6 (six) hours as needed for wheezing or shortness of breath.     albuterol  (VENTOLIN  HFA) 108 (90 Base) MCG/ACT inhaler Inhale 2 puffs into the lungs every 6 (six) hours as needed for wheezing or  shortness of breath. 1 each 5   ALPRAZolam  (XANAX ) 0.5 MG tablet Take 0.5 mg by mouth at bedtime.     amLODipine (NORVASC) 2.5 MG tablet Take 2.5 mg by mouth at bedtime.     apixaban  (ELIQUIS ) 5 MG TABS tablet Take 1 tablet (5 mg total) by mouth 2 (two) times daily. 60 tablet 6   Azelastine  HCl (ASTEPRO ) 0.15 % SOLN Place 1 spray into the nose daily. 30 mL 3   Budeson-Glycopyrrol-Formoterol  (BREZTRI  AEROSPHERE) 160-9-4.8 MCG/ACT AERO Inhale 2 puffs into the lungs in the morning and at bedtime. 10.7 g 5   Budeson-Glycopyrrol-Formoterol  (BREZTRI  AEROSPHERE) 160-9-4.8 MCG/ACT AERO Inhale 2 puffs into the lungs in the morning and at bedtime.     calcium carbonate (OS-CAL) 600 MG TABS tablet Take 600 mg by mouth 2 (two) times daily with a meal.     Coenzyme Q10 100 MG TABS Take 100 mg by mouth every evening.      FLUoxetine (PROZAC) 20 MG capsule Take 20 mg by mouth daily.     fluticasone  (FLONASE ) 50 MCG/ACT nasal spray Place 1 spray into both nostrils daily. 16 g 5   furosemide  (LASIX ) 20 MG tablet Take 1 tablet (20 mg total) by mouth as needed (swelling).     hydrocortisone  (ANUSOL -HC) 2.5 % rectal cream Place 1 Application rectally 2 (two) times daily. 30 g 1   levocetirizine (XYZAL) 5 MG tablet Take 5 mg by mouth daily.     levothyroxine  (SYNTHROID ) 75 MCG tablet TAKE 1/2 TABLET(37.5 MCG) BY MOUTH DAILY BEFORE BREAKFAST 45 tablet 3   metoprolol  tartrate (LOPRESSOR ) 25 MG tablet Take 0.5 tablets (12.5 mg total) by mouth 2 (two) times daily. (May take an additional 12.5mg  tab as needed.) 135 tablet 1   montelukast  (SINGULAIR ) 10 MG tablet Take 1 tablet (10 mg total) by mouth at bedtime. 90 tablet 1   Multiple Vitamin (MULTIVITAMIN) tablet Take 1 tablet by mouth daily.     pantoprazole  (PROTONIX ) 40 MG tablet TAKE 1 TABLET(40 MG) BY MOUTH DAILY 90 tablet 3   simvastatin (ZOCOR) 40 MG tablet Take 40 mg daily by mouth.     No current facility-administered medications for this visit.     Past  Surgical History:  Procedure Laterality Date   APPENDECTOMY     BREAST SURGERY Right 2003ish   biopsy   CARDIAC CATHETERIZATION N/A 04/12/2016   Procedure: Right/Left Heart Cath and Coronary Angiography;  Surgeon: Alm LELON Clay, MD;  Location: [facility] INVASIVE CV LAB;  Service: Cardiovascular;  Laterality: N/A;   CARDIOVERSION N/A 08/16/2017   Procedure: CARDIOVERSION;  Surgeon: Alvan Dorn FALCON, MD;  Location: [name];  Service: Endoscopy;  Laterality: N/A;   CATARACT EXTRACTION Bilateral 2017   CLIPPING OF ATRIAL APPENDAGE  07/05/2016   Procedure: CLIPPING OF ATRIAL APPENDAGE;  Surgeon: Sudie VEAR Laine, MD;  Location: [facility] OR;  Service: Open Heart Surgery;;   COLONOSCOPY  02/2008   Dr. Shaaron- external hemorrhoidal tags o/w normal rectal mucosa, s/p surgical resection with a normal appearing anastomosis 12cm, pan colonic diverticulum   COLONOSCOPY N/A 06/02/2012   MFM:Dujuld post low anterior resection. Pancolonic diverticulosis. Colonic polyp-tubular adenoma. Surveillance due 2019.    COLONOSCOPY N/A 10/13/2015   Procedure: COLONOSCOPY;  Surgeon: Lamar CHRISTELLA Shaaron, MD;  Location: [name];  Service: Endoscopy;  Laterality: N/A;  0930   COLONOSCOPY WITH PROPOFOL  N/A 11/20/2018   Procedure: COLONOSCOPY WITH PROPOFOL ;  Surgeon: Shaaron Lamar CHRISTELLA, MD;  Location: [name];  Service: Endoscopy;  Laterality: N/A;  2:30pm   ESOPHAGOGASTRODUODENOSCOPY  07/2007   Dr. Dawson cervical esophageal web, schatzki ring, large hiatal hernia   ESOPHAGOGASTRODUODENOSCOPY (EGD) WITH PROPOFOL  N/A 11/20/2018   Procedure: ESOPHAGOGASTRODUODENOSCOPY (EGD) WITH PROPOFOL ;  Surgeon: Shaaron Lamar CHRISTELLA, MD;  Location: [name];  Service: Endoscopy;  Laterality: N/A;   INGUNAL HERNIA REPAIR Left    LEG SKIN LESION  BIOPSY / EXCISION     LOW ANTERIOR BOWEL RESECTION  1999   NO ADJ CHEMO   MINIMALLY INVASIVE MAZE PROCEDURE N/A 07/05/2016   Procedure: MINIMALLY INVASIVE MAZE PROCEDURE;  Surgeon: Sudie VEAR Laine, MD;  Location: [facility] OR;  Service: Open Heart Surgery;  Laterality: N/A;   MITRAL VALVE REPAIR Right 07/05/2016   Procedure: MINIMALLY INVASIVE MITRAL VALVE REPAIR (MVR);  Surgeon: Sudie VEAR Laine, MD;  Location[facility] OR;  Service: Open Heart Surgery;  Laterality: Right;   MULTIPLE EXTRACTIONS WITH ALVEOLOPLASTY N/A 05/21/2016   Procedure: MULTIPLE EXTRACTION OF TOOTH #'S 5, 21 WITH ALVEOLOPLASTY AND GROSS DEBRIDEMENT OF TEETH;  Surgeon: Ronald F Kulinski, DDS;  Location: [facility] OR;  Service: Oral Surgery;  Laterality: N/A;   POLYPECTOMY  11/20/2018   Procedure: POLYPECTOMY;  Surgeon: Shaaron Lamar CHRISTELLA, MD;  Location: [name];  Service: Endoscopy;;  colon   TEE WITHOUT CARDIOVERSION N/A 02/20/2016   Procedure: TRANSESOPHAGEAL ECHOCARDIOGRAM (TEE);  Surgeon: Dorn JULIANNA Ross, MD;  Location: [name];  Service: Endoscopy;  Laterality: N/A;   TEE WITHOUT CARDIOVERSION N/A 07/05/2016   Procedure: TRANSESOPHAGEAL ECHOCARDIOGRAM (TEE);  Surgeon: Sudie VEAR Laine, MD;  Location[facility] OR;  Service: Open Heart Surgery;  Laterality: N/A;   TOOTH EXTRACTION     TUBAL LIGATION     VEIN LIGATION AND STRIPPING       Allergies  Allergen Reactions   Iohexol Hives, Shortness Of Breath and Other (See Comments)     Pt. had a severe allergic reaction to IV contrast the last time she was injected and had to be seen in the ER.    Hydrocodone-Acetaminophen  Hives   Nabumetone Rash   Prednisone  Rash      Family History  Problem Relation Age of Onset   Breast cancer Mother    Rheum arthritis Father    Cancer - Lung Sister    Brain cancer Sister    Prostate cancer Brother    Aneurysm Brother    Colon cancer Neg Hx      Social History [name] [name] reports that she has never smoked. She has been exposed to tobacco smoke. She has never used smokeless tobacco. [name] [name] reports no history of alcohol  use.   Review of Systems CONSTITUTIONAL: No  weight loss, fever, chills, weakness or fatigue.   HEENT: Eyes: No visual loss, blurred vision, double vision or yellow sclerae.No hearing loss, sneezing, congestion, runny nose or sore throat.  SKIN: No rash or itching.  CARDIOVASCULAR: per hpi RESPIRATORY: No shortness of breath, cough or sputum.  GASTROINTESTINAL: No anorexia, nausea, vomiting or diarrhea. No abdominal pain or blood.  GENITOURINARY: No burning on urination, no polyuria NEUROLOGICAL: No headache, dizziness, syncope, paralysis, ataxia, numbness or tingling in the extremities. No change in bowel or bladder control.  MUSCULOSKELETAL: No muscle, back pain, joint pain or stiffness.  LYMPHATICS: No enlarged nodes. No history of splenectomy.  PSYCHIATRIC: No history of depression or anxiety.  ENDOCRINOLOGIC: No reports of sweating, cold or heat intolerance. No polyuria or polydipsia.  [name] [name]   Physical Examination Today's Vitals   04/26/23 1000  BP: 118/74  Pulse: 100  SpO2: 95%  Weight: 119 lb 3.2 oz (54.1 kg)  Height: 5' 1 (1.549 m)   Body mass index is 22.52 kg/m.  Gen: resting comfortably, no acute distress HEENT: no scleral icterus, pupils equal round and reactive, no palptable cervical adenopathy,  CV: irreg, 2/6 systolic murmur apex, no jvd Resp: Clear to auscultation bilaterally GI: abdomen is soft, non-tender, non-distended, normal bowel sounds, no hepatosplenomegaly MSK: extremities are warm, no edema.  Skin: warm, no rash Neuro:  no focal deficits Psych: appropriate affect   Diagnostic Studies     Assessment and Plan   1. AFib/Aflutter/acquired thrombophlia - management limited due to sinus node dysfunction, prior bradycardia on amio. May need pacemaker at some point, followed by ep  - doing well on low dose lopressor , - no significant symptoms - recent weight loss, now qualifies for eliquis  2.5mg  bid, will make change - EKG today shows afib at 100, given prior issues with bradycardia would not adjust lopressor    2. Mitral regurgitation/Mitral  valve repair - s/p mitral valve repair -no symptoms, continue to monitor.      3. Chronic diastolic HF - no symptoms, euvolemic today. Has prn lasix , only needs rarely.    4. HTN - she is at goal, continue current meds  5. HLD - at goal, continue current meds     Dorn PHEBE Ross, M.D.

## 2023-04-26 NOTE — Patient Instructions (Signed)
 Medication Instructions:   Decrease Eliquis  2.5mg  twice a day   Continue all other medications.     Labwork:  none  Testing/Procedures:  none  Follow-Up:  6 months   Any Other Special Instructions Will Be Listed Below (If Applicable).   If you need a refill on your cardiac medications before your next appointment, please call your pharmacy.

## 2023-05-23 DIAGNOSIS — Z8261 Family history of arthritis: Secondary | ICD-10-CM | POA: Diagnosis not present

## 2023-05-23 DIAGNOSIS — E063 Autoimmune thyroiditis: Secondary | ICD-10-CM | POA: Diagnosis not present

## 2023-05-23 DIAGNOSIS — K5792 Diverticulitis of intestine, part unspecified, without perforation or abscess without bleeding: Secondary | ICD-10-CM | POA: Diagnosis not present

## 2023-08-07 DIAGNOSIS — L309 Dermatitis, unspecified: Secondary | ICD-10-CM | POA: Diagnosis not present

## 2023-08-07 DIAGNOSIS — D1730 Benign lipomatous neoplasm of skin and subcutaneous tissue of unspecified sites: Secondary | ICD-10-CM | POA: Diagnosis not present

## 2023-08-07 DIAGNOSIS — L0100 Impetigo, unspecified: Secondary | ICD-10-CM | POA: Diagnosis not present

## 2023-09-03 DIAGNOSIS — H43812 Vitreous degeneration, left eye: Secondary | ICD-10-CM | POA: Diagnosis not present

## 2023-10-28 ENCOUNTER — Telehealth: Payer: Self-pay | Admitting: Nurse Practitioner

## 2023-10-28 NOTE — Telephone Encounter (Signed)
 Pt needs labs updated

## 2023-10-30 ENCOUNTER — Encounter: Payer: Self-pay | Admitting: Cardiology

## 2023-10-30 ENCOUNTER — Ambulatory Visit: Attending: Cardiology | Admitting: Cardiology

## 2023-10-30 VITALS — BP 130/78 | HR 96 | Ht 61.0 in | Wt 116.4 lb

## 2023-10-30 DIAGNOSIS — I4819 Other persistent atrial fibrillation: Secondary | ICD-10-CM | POA: Diagnosis not present

## 2023-10-30 DIAGNOSIS — R011 Cardiac murmur, unspecified: Secondary | ICD-10-CM | POA: Insufficient documentation

## 2023-10-30 DIAGNOSIS — I5032 Chronic diastolic (congestive) heart failure: Secondary | ICD-10-CM | POA: Diagnosis present

## 2023-10-30 DIAGNOSIS — I340 Nonrheumatic mitral (valve) insufficiency: Secondary | ICD-10-CM | POA: Diagnosis not present

## 2023-10-30 DIAGNOSIS — D6869 Other thrombophilia: Secondary | ICD-10-CM | POA: Insufficient documentation

## 2023-10-30 DIAGNOSIS — I10 Essential (primary) hypertension: Secondary | ICD-10-CM | POA: Insufficient documentation

## 2023-10-30 DIAGNOSIS — E782 Mixed hyperlipidemia: Secondary | ICD-10-CM | POA: Insufficient documentation

## 2023-10-30 NOTE — Progress Notes (Signed)
 Clinical Summary [name] [name] is a 83 y.o.female seen today for follow up of the following medical problem.s      1. Paroxysmal afib/ Aflutter - s/p MAZE procedure during MV repair - management complicated by sinus node dysfunction. Has affected ability to take av nodal agents. Significant brady on amio, now off.    - 03/2019 event monitor with just SR, rare PACs   - limited palpitations, compliant with meds - occasional blood on toilet paper, has history of hemorroids.    2. Chest pain - upper left chest tightness, 2/10 in severity. Can occur at rest or with activity. No other associated symptoms. Lasts few minutes. Sporadic in frequency. Not positional. No relation to eating - no specific exertional symptoms.    3. Sinus node dysfunction - followed by EP, watchful waiting. Likely will require ppm at some point - no dizziness or lightheadedness.      4. Mitral regurgitation, s/p MV repair - echo at last [facility] Jan 2017 showed normal LVEF, 3+(modarte) eccentric MR with immobile posterior leaflet.  - repeat echo 01/2016 moderate to severe MR. LVEF 60-65%. LVIDs 31. Symptoms unchanged since last visit. - 01/2016 TEE moderate to severe eccentric MR, severe TR.  - she is s/p mitral valve repair 07/05/16, Sorin Memo 3D Ring Annuloplasty (size 30mm, catalog #[id number], serial [id number])  - repeat echo 10/2016 with LVEF 60-65%, no WMAs, normal MV repair and ring, moderate TR.   - SOB but associated wheezing, better with inhaler - occasional LE edema. Has lasix  prn   5. Chronic diastolic HF - no recent edema. SOB but with wheezing and better with inhalers - has not needed her prn lasix      6. History of GI bleed - followed by GI - notes indicate prior hemorroidal bleeding       7. COPD - followed by Dr Shellia       8. Venous insufficiency - followed by vascular   9. Angioedema - ER visit 02/2023 with symptoms, has not been on ACE or ARB   10. HLD - 11/2022 TC 136 TG 70  HDL 46 LDL 76 - upcoming labs with pcp Past Medical History:  Diagnosis Date   Anxiety    Arthritis    Asthma    Atrial fibrillation, persistent ([name])    Chronic diastolic congestive heart failure ([name])    Colon adenoma    Colon cancer ([name]) 1999   status post low anterior resection, limited stage disease requiring no adjuvant therapy   COPD (chronic obstructive pulmonary disease) ([name])    Depression    Diverticulosis    DM (dermatomyositis)    DVT (deep venous thrombosis) ([name])    in leg- long time ago   Dyspnea    with activity   Dysrhythmia    Esophageal dysphagia    GERD (gastroesophageal reflux disease)    Headache    Heart murmur    Hematuria    Hemorrhoids    Hiatal hernia    History of kidney stones    x 2   Hypercholesterolemia    Incidental pulmonary nodule 05/08/2016   8 mm vague opacity RML noted on CT scan   Mitral regurgitation    PONV (postoperative nausea and vomiting) 2003 ish    with breast biopsy   S/P minimally invasive maze operation for atrial fibrillation 07/05/2016   Complete bilateral atrial lesion set using cryothermy and bipolar radiofrequency ablation with clipping of LA appendage via right mini thoracotomy  approach   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/05/2016   Complex valvuloplasty including artificial Gore-tex neochord placement x6 and 30 mm Sorin Memo 3D ring annuloplasty via right mini thoracotomy approach   Schatzki's ring    Tricuspid regurgitation      Allergies  Allergen Reactions   Iohexol Hives, Shortness Of Breath and Other (See Comments)     Pt. had a severe allergic reaction to IV contrast the last time she was injected and had to be seen in the ER.    Hydrocodone-Acetaminophen  Hives   Nabumetone Rash   Prednisone  Rash     Current Outpatient Medications  Medication Sig Dispense Refill   acetaminophen  (TYLENOL ) 500 MG tablet Take 1,000 mg by mouth as needed for moderate pain or headache.     albuterol   (PROVENTIL ) (2.5 MG/3ML) 0.083% nebulizer solution Take 2.5 mg by nebulization every 6 (six) hours as needed for wheezing or shortness of breath.     albuterol  (VENTOLIN  HFA) 108 (90 Base) MCG/ACT inhaler Inhale 2 puffs into the lungs every 6 (six) hours as needed for wheezing or shortness of breath. 1 each 5   ALPRAZolam  (XANAX ) 0.5 MG tablet Take 0.5 mg by mouth at bedtime.     amLODipine (NORVASC) 2.5 MG tablet Take 2.5 mg by mouth at bedtime.     apixaban  (ELIQUIS ) 2.5 MG TABS tablet Take 1 tablet (2.5 mg total) by mouth 2 (two) times daily. 60 tablet 6   Azelastine  HCl (ASTEPRO ) 0.15 % SOLN Place 1 spray into the nose daily. 30 mL 3   Budeson-Glycopyrrol-Formoterol (BREZTRI  AEROSPHERE) 160-9-4.8 MCG/ACT AERO Inhale 2 puffs into the lungs in the morning and at bedtime. 10.7 g 5   Calcium 500-2.5 MG-MCG CHEW Chew 1 tablet by mouth daily.     calcium carbonate (OS-CAL) 600 MG TABS tablet Take 600 mg by mouth 2 (two) times daily with a meal.     cetirizine (ZYRTEC) 10 MG tablet Take 10 mg by mouth daily.     Cholecalciferol (VITAMIN D3) 25 MCG (1000 UT) CAPS Take 1 capsule by mouth daily.     Coenzyme Q10 100 MG TABS Take 100 mg by mouth every evening.      EPINEPHrine  0.3 mg/0.3 mL IJ SOAJ injection Inject 0.3 mg into the muscle as needed.     FLUoxetine (PROZAC) 20 MG capsule Take 20 mg by mouth daily.     fluticasone  (FLONASE ) 50 MCG/ACT nasal spray Place 1 spray into both nostrils daily. 16 g 5   furosemide  (LASIX ) 20 MG tablet Take 1 tablet (20 mg total) by mouth as needed (swelling).     hydrocortisone  (ANUSOL -HC) 2.5 % rectal cream Place 1 Application rectally 2 (two) times daily. 30 g 1   levocetirizine (XYZAL) 5 MG tablet Take 5 mg by mouth daily.     levothyroxine  (SYNTHROID ) 75 MCG tablet TAKE 1/2 TABLET(37.5 MCG) BY MOUTH DAILY BEFORE BREAKFAST 45 tablet 3   metoprolol  tartrate (LOPRESSOR ) 25 MG tablet Take 0.5 tablets (12.5 mg total) by mouth 2 (two) times daily. (May take an  additional 12.5mg  tab as needed.) 135 tablet 1   montelukast  (SINGULAIR ) 10 MG tablet Take 1 tablet (10 mg total) by mouth at bedtime. 90 tablet 1   Multiple Vitamin (MULTIVITAMIN) tablet Take 1 tablet by mouth daily.     ondansetron  (ZOFRAN -ODT) 4 MG disintegrating tablet Take 4 mg by mouth daily as needed.     pantoprazole  (PROTONIX ) 40 MG tablet TAKE 1 TABLET(40 MG) BY MOUTH  DAILY 90 tablet 3   simvastatin (ZOCOR) 40 MG tablet Take 40 mg daily by mouth.     traMADol  (ULTRAM ) 50 MG tablet Take 50 mg by mouth 4 (four) times daily as needed.     No current facility-administered medications for this visit.     Past Surgical History:  Procedure Laterality Date   APPENDECTOMY     BREAST SURGERY Right 2003ish   biopsy   CARDIAC CATHETERIZATION N/A 04/12/2016   Procedure: Right/Left Heart Cath and Coronary Angiography;  Surgeon: Alm LELON Clay, MD;  Location: [facility] INVASIVE CV LAB;  Service: Cardiovascular;  Laterality: N/A;   CARDIOVERSION N/A 08/16/2017   Procedure: CARDIOVERSION;  Surgeon: Alvan Dorn FALCON, MD;  Location: [name];  Service: Endoscopy;  Laterality: N/A;   CATARACT EXTRACTION Bilateral 2017   CLIPPING OF ATRIAL APPENDAGE  07/05/2016   Procedure: CLIPPING OF ATRIAL APPENDAGE;  Surgeon: Sudie VEAR Laine, MD;  Location: [facility] OR;  Service: Open Heart Surgery;;   COLONOSCOPY  02/2008   Dr. Shaaron- external hemorrhoidal tags o/w normal rectal mucosa, s/p surgical resection with a normal appearing anastomosis 12cm, pan colonic diverticulum   COLONOSCOPY N/A 06/02/2012   MFM:Dujuld post low anterior resection. Pancolonic diverticulosis. Colonic polyp-tubular adenoma. Surveillance due 2019.    COLONOSCOPY N/A 10/13/2015   Procedure: COLONOSCOPY;  Surgeon: Lamar CHRISTELLA Shaaron, MD;  Location: [name];  Service: Endoscopy;  Laterality: N/A;  0930   COLONOSCOPY WITH PROPOFOL  N/A 11/20/2018   Procedure: COLONOSCOPY WITH PROPOFOL ;  Surgeon: Shaaron Lamar CHRISTELLA, MD;  Location: [name];   Service: Endoscopy;  Laterality: N/A;  2:30pm   ESOPHAGOGASTRODUODENOSCOPY  07/2007   Dr. Dawson cervical esophageal web, schatzki ring, large hiatal hernia   ESOPHAGOGASTRODUODENOSCOPY (EGD) WITH PROPOFOL  N/A 11/20/2018   Procedure: ESOPHAGOGASTRODUODENOSCOPY (EGD) WITH PROPOFOL ;  Surgeon: Shaaron Lamar CHRISTELLA, MD;  Location: [name];  Service: Endoscopy;  Laterality: N/A;   INGUNAL HERNIA REPAIR Left    LEG SKIN LESION  BIOPSY / EXCISION     LOW ANTERIOR BOWEL RESECTION  1999   NO ADJ CHEMO   MINIMALLY INVASIVE MAZE PROCEDURE N/A 07/05/2016   Procedure: MINIMALLY INVASIVE MAZE PROCEDURE;  Surgeon: Sudie VEAR Laine, MD;  Location: [facility] OR;  Service: Open Heart Surgery;  Laterality: N/A;   MITRAL VALVE REPAIR Right 07/05/2016   Procedure: MINIMALLY INVASIVE MITRAL VALVE REPAIR (MVR);  Surgeon: Sudie VEAR Laine, MD;  Location: [facility] OR;  Service: Open Heart Surgery;  Laterality: Right;   MULTIPLE EXTRACTIONS WITH ALVEOLOPLASTY N/A 05/21/2016   Procedure: MULTIPLE EXTRACTION OF TOOTH #'S 5, 21 WITH ALVEOLOPLASTY AND GROSS DEBRIDEMENT OF TEETH;  Surgeon: Ronald F Kulinski, DDS;  Location: [facility] OR;  Service: Oral Surgery;  Laterality: N/A;   POLYPECTOMY  11/20/2018   Procedure: POLYPECTOMY;  Surgeon: Shaaron Lamar CHRISTELLA, MD;  Location: [name];  Service: Endoscopy;;  colon   TEE WITHOUT CARDIOVERSION N/A 02/20/2016   Procedure: TRANSESOPHAGEAL ECHOCARDIOGRAM (TEE);  Surgeon: Dorn FALCON Alvan, MD;  Location: [name];  Service: Endoscopy;  Laterality: N/A;   TEE WITHOUT CARDIOVERSION N/A 07/05/2016   Procedure: TRANSESOPHAGEAL ECHOCARDIOGRAM (TEE);  Surgeon: Sudie VEAR Laine, MD;  Location: [facility] OR;  Service: Open Heart Surgery;  Laterality: N/A;   TOOTH EXTRACTION     TUBAL LIGATION     VEIN LIGATION AND STRIPPING       Allergies  Allergen Reactions   Iohexol Hives, Shortness Of Breath and Other (See Comments)     Pt. had a severe allergic  reaction to IV contrast the last time she was  injected and had to be seen in the ER.    Hydrocodone-Acetaminophen  Hives   Nabumetone Rash   Prednisone  Rash      Family History  Problem Relation Age of Onset   Breast cancer Mother    Rheum arthritis Father    Cancer - Lung Sister    Brain cancer Sister    Prostate cancer Brother    Aneurysm Brother    Colon cancer Neg Hx      Social History [name] [name] reports that she has never smoked. She has been exposed to tobacco smoke. She has never used smokeless tobacco. [name] [name] reports no history of alcohol  use.    Physical Examination Today's Vitals   10/30/23 1028  BP: 130/78  Pulse: 96  SpO2: 98%  Weight: 116 lb 6.4 oz (52.8 kg)  Height: 5' 1 (1.549 m)   Body mass index is 21.99 kg/m.  Gen: resting comfortably, no acute distress HEENT: no scleral icterus, pupils equal round and reactive, no palptable cervical adenopathy,  CV: irreg, 3/6 systolic murmur rusb, no jvd Resp: Clear to auscultation bilaterally GI: abdomen is soft, non-tender, non-distended, normal bowel sounds, no hepatosplenomegaly MSK: extremities are warm, no edema.  Skin: warm, no rash Neuro:  no focal deficits Psych: appropriate affect   Diagnostic Studies     Assessment and Plan  1. AFib/Aflutter/acquired thrombophlia - management limited due to sinus node dysfunction, prior bradycardia on amio. May need pacemaker at some point, followed by ep  - doing well on low dose lopressor , -EKG today shows rate controlled afib - overall doing well, continue current meds including eliquis  for stroke prevention   2. Mitral regurgitation/Mitral valve repair - s/p mitral valve repair -no symptoms. Murmur on exam more suggestive of aortic valve disease, will check echo     3. Chronic diastolic HF - euvolemic without symptoms, continue current therapy   4. HTN - bp at goal, continue currentmeds   5. HLD - has been at goal, f/u upcoming labs with pcp  F/u 6 months      Dorn PHEBE Ross, M.D

## 2023-10-30 NOTE — Patient Instructions (Addendum)
 Medication Instructions:   Continue all current medications.   Labwork:  none  Testing/Procedures:  Your physician has requested that you have an echocardiogram. Echocardiography is a painless test that uses sound waves to create images of your heart. It provides your doctor with information about the size and shape of your heart and how well your heart's chambers and valves are working. This procedure takes approximately one hour. There are no restrictions for this procedure. Please do NOT wear cologne, perfume, aftershave, or lotions (deodorant is allowed). Please arrive 15 minutes prior to your appointment time.  Please note: We ask at that you not bring children with you during ultrasound (echo/ vascular) testing. Due to room size and safety concerns, children are not allowed in the ultrasound rooms during exams. Our front office staff cannot provide observation of children in our lobby area while testing is being conducted. An adult accompanying a patient to their appointment will only be allowed in the ultrasound room at the discretion of the ultrasound technician under special circumstances. We apologize for any inconvenience.  Office will contact with results via phone, letter or mychart.     Follow-Up:  6 months   Any Other Special Instructions Will Be Listed Below (If Applicable).   If you need a refill on your cardiac medications before your next appointment, please call your pharmacy.

## 2023-10-31 ENCOUNTER — Other Ambulatory Visit (HOSPITAL_COMMUNITY)
Admission: RE | Admit: 2023-10-31 | Discharge: 2023-10-31 | Disposition: A | Discharge status: Home / Self Care | Source: Ambulatory Visit | Attending: Nurse Practitioner | Admitting: Nurse Practitioner

## 2023-10-31 DIAGNOSIS — E032 Hypothyroidism due to medicaments and other exogenous substances: Secondary | ICD-10-CM | POA: Diagnosis not present

## 2023-10-31 LAB — T4, FREE: Free T4: 0.89 ng/dL (ref 0.61–1.12)

## 2023-10-31 LAB — TSH: TSH: 1.476 u[IU]/mL (ref 0.350–4.500)

## 2023-11-04 ENCOUNTER — Other Ambulatory Visit

## 2023-11-04 NOTE — Telephone Encounter (Signed)
 Opened to send a note to update labs, but pt has already completed labs.

## 2023-11-06 ENCOUNTER — Ambulatory Visit: Attending: Cardiology

## 2023-11-06 DIAGNOSIS — R011 Cardiac murmur, unspecified: Secondary | ICD-10-CM | POA: Diagnosis not present

## 2023-11-06 LAB — ECHOCARDIOGRAM COMPLETE
AR max vel: 1.01 cm2
AV Area VTI: 0.89 cm2
AV Area mean vel: 0.93 cm2
AV Mean grad: 13 mmHg
AV Peak grad: 17.8 mmHg
Ao pk vel: 2.11 m/s
Area-P 1/2: 2.78 cm2
Calc EF: 64.4 %
MV VTI: 1.01 cm2
S' Lateral: 2.5 cm
Single Plane A2C EF: 68.8 %
Single Plane A4C EF: 58.3 %

## 2023-11-06 NOTE — Patient Instructions (Signed)

## 2023-11-08 ENCOUNTER — Ambulatory Visit (INDEPENDENT_AMBULATORY_CARE_PROVIDER_SITE_OTHER): Payer: Medicare Other | Admitting: Nurse Practitioner

## 2023-11-08 ENCOUNTER — Encounter: Payer: Self-pay | Admitting: Nurse Practitioner

## 2023-11-08 VITALS — BP 116/64 | HR 97 | Ht 61.0 in | Wt 117.2 lb

## 2023-11-08 DIAGNOSIS — E032 Hypothyroidism due to medicaments and other exogenous substances: Secondary | ICD-10-CM

## 2023-11-08 MED ORDER — LEVOTHYROXINE SODIUM 75 MCG PO TABS: ORAL_TABLET | ORAL | 3 refills | Status: AC

## 2023-11-08 NOTE — Progress Notes (Signed)
 11/08/2023, 9:57 AM                 Endocrinology follow-up   Subjective:    Patient ID: [name] [name], female    DOB: 04/04/1941, PCP Leavy Waddell NOVAK, FNP   Past Medical History:  Diagnosis Date   Anxiety    Arthritis    Asthma    Atrial fibrillation, persistent ([name])    Chronic diastolic congestive heart failure ([name])    Colon adenoma    Colon cancer ([name]) 1999   status post low anterior resection, limited stage disease requiring no adjuvant therapy   COPD (chronic obstructive pulmonary disease) ([name])    Depression    Diverticulosis    DM (dermatomyositis)    DVT (deep venous thrombosis) ([name])    in leg- long time ago   Dyspnea    with activity   Dysrhythmia    Esophageal dysphagia    GERD (gastroesophageal reflux disease)    Headache    Heart murmur    Hematuria    Hemorrhoids    Hiatal hernia    History of kidney stones    x 2   Hypercholesterolemia    Incidental pulmonary nodule 05/08/2016   8 mm vague opacity RML noted on CT scan   Mitral regurgitation    PONV (postoperative nausea and vomiting) 2003 ish    with breast biopsy   S/P minimally invasive maze operation for atrial fibrillation 07/05/2016   Complete bilateral atrial lesion set using cryothermy and bipolar radiofrequency ablation with clipping of LA appendage via right mini thoracotomy approach   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/05/2016   Complex valvuloplasty including artificial Gore-tex neochord placement x6 and 30 mm Sorin Memo 3D ring annuloplasty via right mini thoracotomy approach   Schatzki's ring    Tricuspid regurgitation    Past Surgical History:  Procedure Laterality Date   APPENDECTOMY     BREAST SURGERY Right 2003ish   biopsy   CARDIAC CATHETERIZATION N/A 04/12/2016   Procedure: Right/Left Heart Cath and Coronary Angiography;  Surgeon: Alm LELON Clay, MD;  Location: [facility] INVASIVE CV LAB;  Service:  Cardiovascular;  Laterality: N/A;   CARDIOVERSION N/A 08/16/2017   Procedure: CARDIOVERSION;  Surgeon: Alvan Dorn FALCON, MD;  Location: [name];  Service: Endoscopy;  Laterality: N/A;   CATARACT EXTRACTION Bilateral 2017   CLIPPING OF ATRIAL APPENDAGE  07/05/2016   Procedure: CLIPPING OF ATRIAL APPENDAGE;  Surgeon: Sudie VEAR Laine, MD;  Location: [facility] OR;  Service: Open Heart Surgery;;   COLONOSCOPY  02/2008   Dr. Shaaron- external hemorrhoidal tags o/w normal rectal mucosa, s/p surgical resection with a normal appearing anastomosis 12cm, pan colonic diverticulum   COLONOSCOPY N/A 06/02/2012   MFM:Dujuld post low anterior resection. Pancolonic diverticulosis. Colonic polyp-tubular adenoma. Surveillance due 2019.    COLONOSCOPY N/A 10/13/2015   Procedure: COLONOSCOPY;  Surgeon: Lamar CHRISTELLA Shaaron, MD;  Location: [name];  Service: Endoscopy;  Laterality: N/A;  0930   COLONOSCOPY WITH PROPOFOL  N/A 11/20/2018   Procedure: COLONOSCOPY WITH PROPOFOL ;  Surgeon: Shaaron Lamar CHRISTELLA, MD;  Location: [name];  Service: Endoscopy;  Laterality: N/A;  2:30pm   ESOPHAGOGASTRODUODENOSCOPY  07/2007   Dr. Dawson cervical esophageal web, schatzki  ring, large hiatal hernia   ESOPHAGOGASTRODUODENOSCOPY (EGD) WITH PROPOFOL  N/A 11/20/2018   Procedure: ESOPHAGOGASTRODUODENOSCOPY (EGD) WITH PROPOFOL ;  Surgeon: Shaaron Lamar HERO, MD;  [name];  Service: Endoscopy;  Laterality: N/A;   INGUNAL HERNIA REPAIR Left    LEG SKIN LESION  BIOPSY / EXCISION     LOW ANTERIOR BOWEL RESECTION  1999   NO ADJ CHEMO   MINIMALLY INVASIVE MAZE PROCEDURE N/A 07/05/2016   Procedure: MINIMALLY INVASIVE MAZE PROCEDURE;  Surgeon: Sudie VEAR Laine, MD;  Location: [facility] OR;  Service: Open Heart Surgery;  Laterality: N/A;   MITRAL VALVE REPAIR Right 07/05/2016   Procedure: MINIMALLY INVASIVE MITRAL VALVE REPAIR (MVR);  Surgeon: Sudie VEAR Laine, MD;  Location: [facility] OR;  Service: Open Heart Surgery;  Laterality: Right;    MULTIPLE EXTRACTIONS WITH ALVEOLOPLASTY N/A 05/21/2016   Procedure: MULTIPLE EXTRACTION OF TOOTH #'S 5, 21 WITH ALVEOLOPLASTY AND GROSS DEBRIDEMENT OF TEETH;  Surgeon: Ronald F Kulinski, DDS;  Location: [facility] OR;  Service: Oral Surgery;  Laterality: N/A;   POLYPECTOMY  11/20/2018   Procedure: POLYPECTOMY;  Surgeon: Shaaron Lamar HERO, MD;  [name];  Service: Endoscopy;;  colon   TEE WITHOUT CARDIOVERSION N/A 02/20/2016   Procedure: TRANSESOPHAGEAL ECHOCARDIOGRAM (TEE);  Surgeon: Dorn JULIANNA Ross, MD;  [name];  Service: Endoscopy;  Laterality: N/A;   TEE WITHOUT CARDIOVERSION N/A 07/05/2016   Procedure: TRANSESOPHAGEAL ECHOCARDIOGRAM (TEE);  Surgeon: Sudie VEAR Laine, MD;  Location: [facility] OR;  Service: Open Heart Surgery;  Laterality: N/A;   TOOTH EXTRACTION     TUBAL LIGATION     VEIN LIGATION AND STRIPPING     Social History   Socioeconomic History   Marital status: Married    Spouse name: Not on file   Number of children: 2   Years of education: Not on file   Highest education level: Not on file  Occupational History   Not on file  Tobacco Use   Smoking status: Never    Passive exposure: Past   Smokeless tobacco: Never  Vaping Use   Vaping status: Never Used  Substance and Sexual Activity   Alcohol  use: No    Alcohol /week: 0.0 standard drinks of alcohol    Drug use: No   Sexual activity: Not Currently  Other Topics Concern   Not on file  Social History Narrative   Not on file   Social Drivers of Health   Financial Resource Strain: Low Risk  (12/06/2017)   Received from [facility]   Overall Financial Resource Strain (CARDIA)    Difficulty of Paying Living Expenses: Not hard at all  Food Insecurity: No Food Insecurity (12/06/2017)   [facility]   Hunger Vital Sign    Worried About Running Out of Food in the Last Year: Never true    Ran Out of Food in the Last Year: Never true  Transportation Needs: No Transportation Needs  (12/06/2017)   Received from [facility]   PRAPARE - Transportation    Lack of Transportation (Medical): No    Lack of Transportation (Non-Medical): No  Physical Activity: Inactive (12/06/2017)   Received from [facility]   Exercise Vital Sign    Days of Exercise per Week: 0 days    Minutes of Exercise per Session: 0 min  Stress: No Stress Concern Present (12/06/2017)   Received from [facility] of Occupational Health - Occupational Stress Questionnaire    Feeling  of Stress : Not at all  Social Connections: Moderately Integrated (12/06/2017)   Received from [facility]   Social Connection and Isolation Panel    Frequency of Communication with Friends and Family: More than three times a week    Frequency of Social Gatherings with Friends and Family: Once a week    Attends Religious Services: 1 to 4 times per year    Active Member of [employer] or Organizations: No    Attends Banker Meetings: Never    Marital Status: Married   Outpatient Encounter Medications as of 11/08/2023  Medication Sig   acetaminophen  (TYLENOL ) 500 MG tablet Take 1,000 mg by mouth as needed for moderate pain or headache.   albuterol  (PROVENTIL ) (2.5 MG/3ML) 0.083% nebulizer solution Take 2.5 mg by nebulization every 6 (six) hours as needed for wheezing or shortness of breath.   albuterol  (VENTOLIN  HFA) 108 (90 Base) MCG/ACT inhaler Inhale 2 puffs into the lungs every 6 (six) hours as needed for wheezing or shortness of breath.   ALPRAZolam  (XANAX ) 0.5 MG tablet Take 0.5 mg by mouth at bedtime.   amLODipine (NORVASC) 2.5 MG tablet Take 2.5 mg by mouth at bedtime.   apixaban  (ELIQUIS ) 2.5 MG TABS tablet Take 1 tablet (2.5 mg total) by mouth 2 (two) times daily.   Azelastine  HCl (ASTEPRO ) 0.15 % SOLN Place 1 spray into the nose daily.   Budeson-Glycopyrrol-Formoterol (BREZTRI  AEROSPHERE) 160-9-4.8 MCG/ACT AERO Inhale 2 puffs into the lungs in the morning and at bedtime.  (Patient taking differently: Inhale 2 puffs into the lungs in the morning and at bedtime. As needed)   Calcium 500-2.5 MG-MCG CHEW Chew 1 tablet by mouth daily.   calcium carbonate (OS-CAL) 600 MG TABS tablet Take 600 mg by mouth 2 (two) times daily with a meal.   cetirizine (ZYRTEC) 10 MG tablet Take 10 mg by mouth daily.   Cholecalciferol (VITAMIN D3) 25 MCG (1000 UT) CAPS Take 1 capsule by mouth daily.   Coenzyme Q10 100 MG TABS Take 100 mg by mouth every evening.    EPINEPHrine  0.3 mg/0.3 mL IJ SOAJ injection Inject 0.3 mg into the muscle as needed.   FLUoxetine (PROZAC) 20 MG capsule Take 20 mg by mouth daily.   fluticasone  (FLONASE ) 50 MCG/ACT nasal spray Place 1 spray into both nostrils daily.   furosemide  (LASIX ) 20 MG tablet Take 1 tablet (20 mg total) by mouth as needed (swelling).   hydrocortisone  (ANUSOL -HC) 2.5 % rectal cream Place 1 Application rectally 2 (two) times daily. (Patient taking differently: Place 1 Application rectally as needed.)   levocetirizine (XYZAL) 5 MG tablet Take 5 mg by mouth daily.   metoprolol  tartrate (LOPRESSOR ) 25 MG tablet Take 0.5 tablets (12.5 mg total) by mouth 2 (two) times daily. (May take an additional 12.5mg  tab as needed.)   montelukast  (SINGULAIR ) 10 MG tablet Take 1 tablet (10 mg total) by mouth at bedtime.   Multiple Vitamin (MULTIVITAMIN) tablet Take 1 tablet by mouth daily.   ondansetron  (ZOFRAN -ODT) 4 MG disintegrating tablet Take 4 mg by mouth daily as needed.   pantoprazole  (PROTONIX ) 40 MG tablet TAKE 1 TABLET(40 MG) BY MOUTH DAILY   simvastatin (ZOCOR) 40 MG tablet Take 40 mg daily by mouth.   traMADol  (ULTRAM ) 50 MG tablet Take 50 mg by mouth 4 (four) times daily as needed.   [DISCONTINUED] levothyroxine  (SYNTHROID ) 75 MCG tablet TAKE 1/2 TABLET(37.5 MCG) BY MOUTH DAILY BEFORE BREAKFAST (Patient taking differently: Take 37.5 mcg by mouth daily  before breakfast. TAKE 1/2 TABLET(37.5 MCG) BY MOUTH DAILY BEFORE BREAKFAST)   levothyroxine   (SYNTHROID ) 75 MCG tablet TAKE 1/2 TABLET(37.5 MCG) BY MOUTH DAILY BEFORE BREAKFAST   No facility-administered encounter medications on file as of 11/08/2023.   ALLERGIES: Allergies  Allergen Reactions   Iohexol Hives, Shortness Of Breath and Other (See Comments)     Pt. had a severe allergic reaction to IV contrast the last time she was injected and had to be seen in the ER.    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 ) visit. Patient reports no anxiety, cold intolerance, constipation, depressed mood, diarrhea, fatigue, hair loss, heat intolerance, leg swelling, palpitations, tremors, weight gain or weight loss. The symptoms have been stable.   Flordia Kassem Barot is 83 y.o. female who is being seen in follow-up hypothyroidism.   -She denies weight loss, tremors, or heat and cold intolerance.    -She is on multiple medications including anticoagulation with warfarin related to her cardiac surgery for tricuspid regurgitation as well as atrial fibrillation. -Her current medication list does not include active amiodarone .   Review of systems  Constitutional: + steadily decreasing body weight,  current Body mass index is 22.14 kg/m. , no fatigue, no subjective hyperthermia, no subjective hypothermia Eyes: no blurry vision, no xerophthalmia ENT: no sore throat, no nodules palpated in throat, no  dysphagia/odynophagia, no hoarseness Cardiovascular: no chest pain, no shortness of breath, intermittent palpitations- stable (hx afib), no leg swelling Respiratory: no cough, no shortness of breath Gastrointestinal: no nausea/vomiting/diarrhea Musculoskeletal: no muscle/joint aches Skin: no rashes, no hyperemia Neurological: no tremors, no numbness, no tingling, no dizziness Psychiatric: no depression, no anxiety    Objective:    BP 116/64 (BP Location: Left Arm, Patient Position: Sitting, Cuff Size: Large)   Pulse 97   Ht 5' 1 (1.549 m)   Wt 117 lb 3.2 oz (53.2 kg)   BMI 22.14 kg/m   Wt Readings from Last 3 Encounters:  11/08/23 117 lb 3.2 oz (53.2 kg)  10/30/23 116 lb 6.4 oz (52.8 kg)  04/26/23 119 lb 3.2 oz (54.1 kg)   BP Readings from Last 3 Encounters:  11/08/23 116/64  10/30/23 130/78  04/26/23 118/74    Physical Exam- Limited  Constitutional:  Body mass index is 22.14 kg/m. , not in acute distress, normal state of mind Eyes:  EOMI, no exophthalmos Musculoskeletal: no gross deformities, strength intact in all four extremities, no gross restriction of joint movements Skin:  no rashes, no hyperemia Neurological: no tremor with outstretched hands   CMP     Component Value Date/Time   NA 144 08/12/2017 1409   K 3.8 08/12/2017 1409   CL 100 (L) 08/12/2017 1409   CO2 27 08/12/2017 1409   GLUCOSE 96 08/12/2017 1409   BUN 15 08/12/2017 1409   CREATININE 1.01 (H) 08/12/2017 1409   CALCIUM 9.2 08/12/2017 1409   PROT 6.7 07/03/2016 1157   ALBUMIN  3.8 07/03/2016 1157   AST 23 07/03/2016 1157   ALT 19  07/03/2016 1157   ALKPHOS 113 07/03/2016 1157   BILITOT 0.5 07/03/2016 1157   GFRNONAA 52 (L) 08/12/2017 1409   GFRAA >60 08/12/2017 1409    Diabetic Labs (most recent): Lab Results  Component Value Date   HGBA1C 5.6 07/03/2016    Lab Results  Component Value Date   TSH 1.476 10/31/2023   TSH 1.400 11/02/2022   TSH 2.334 10/17/2021   TSH 1.105  09/13/2020   TSH 1.065 03/14/2020   TSH 1.141 11/13/2019   TSH 2.202 05/13/2019   TSH 2.146 01/07/2019   TSH 0.084 (L) 09/29/2018   TSH 0.311 (L) 06/30/2018   FREET4 0.89 10/31/2023   FREET4 1.45 11/02/2022   FREET4 1.31 (H) 10/17/2021   FREET4 0.95 09/13/2020   FREET4 0.99 03/14/2020   FREET4 1.13 (H) 11/13/2019   FREET4 0.95 05/13/2019   FREET4 1.09 01/07/2019   FREET4 1.27 09/29/2018   FREET4 1.23 06/30/2018    Recent Results (from the past 2160 hours)  TSH     Status: None   Collection Time: 10/31/23  9:50 AM  Result Value Ref Range   TSH 1.476 0.350 - 4.500 uIU/mL    Comment: Performed by a 3rd Generation assay with a functional sensitivity of <=0.01 uIU/mL. Performed at [facility], [address], [place], [place] [address]   T4, free     Status: None   Collection Time: 10/31/23  9:50 AM  Result Value Ref Range   Free T4 0.89 0.61 - 1.12 ng/dL    Comment: (NOTE) Biotin ingestion may interfere with free T4 tests. If the results are inconsistent with the TSH level, previous test results, or the clinical presentation, then consider biotin interference. If needed, order repeat testing after stopping biotin. Performed at [facility], [address], [place], [place] [address]   ECHOCARDIOGRAM COMPLETE     Status: None   Collection Time: 11/06/23  3:54 PM  Result Value Ref Range   S' Lateral 2.50 cm   Area-P 1/2 2.78 cm2   Single Plane A2C EF 68.8 %   Single Plane A4C EF 58.3 %   Calc EF 64.4 %   AV Area VTI 0.89 cm2   MV VTI 1.01 cm2   AV Area mean vel 0.93 cm2   AR max vel 1.01 cm2   Ao pk vel 2.11 m/s   AV Mean grad 13.0 mmHg   AV Peak grad 17.8 mmHg   Est EF 60 - 65%     Latest Reference Range & Units 09/13/20 14:00 09/13/20 14:01 10/17/21 11:22 11/02/22 14:13 10/31/23 09:50  TSH 0.350 - 4.500 uIU/mL 1.105  2.334 1.400 1.476  T4,Free(Direct) 0.61 - 1.12 ng/dL  9.04 8.68 (H) 8.54 9.10  (H): Data is abnormally high  Assessment & Plan:   1.   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  She is advised to continue Levothyroxine  37.5 mcg po daily before breakfast.   - We discussed about the correct intake of her thyroid  hormone, on empty stomach at fasting, with water , separated by at least 30 minutes from breakfast and other medications,  and separated by more than 4 hours from calcium, iron, multivitamins, acid reflux medications (PPIs). -Patient is made aware of the fact that thyroid  hormone replacement is needed for life, dose to be adjusted by periodic monitoring of thyroid  function tests.   - I advised her  to maintain close follow up with Leavy Birmingham  B, FNP for primary care needs.     I spent  12  minutes in the care of the patient today including review of labs from Thyroid  Function, CMP, and other relevant labs ; imaging/biopsy records (current and previous including abstractions from other facilities); face-to-face time discussing  her lab results and symptoms, medications doses, her options of short and long term treatment based on the latest standards of care / guidelines;   and documenting the encounter.  Latiffany Harwick Aldridge  participated in the discussions, expressed understanding, and voiced agreement with the above plans.  All questions were answered to her satisfaction. she is encouraged to contact clinic should she have any questions or concerns prior to her return visit.  Follow up plan: Return in about 1 year (around 11/07/2024) for Thyroid  follow up, Previsit labs.   Benton Rio, [facility] [address] [place], [place] [address] Phone: [phone] Fax: [phone]    11/08/2023, 9:57 AM

## 2023-11-15 ENCOUNTER — Telehealth: Payer: Self-pay | Admitting: Cardiology

## 2023-11-15 NOTE — Telephone Encounter (Signed)
 I will forward to MD to result

## 2023-11-15 NOTE — Telephone Encounter (Signed)
 Pt came into [place] office and wants to know results from her echo.   [id number]

## 2023-11-18 ENCOUNTER — Ambulatory Visit: Payer: Self-pay | Admitting: Cardiology

## 2023-11-18 NOTE — Telephone Encounter (Signed)
 Echo shows normal heart pumping function. Normal functioning repaired mitral valve. Aortic valve is mildly stiff, just something to monitor at this time. Some signs of stiffness of the heart muscle as well which she has had previously and is age related. Some signs of pulmonary hypertension which is likely related to her COPD and age related stiffness of the heart muscle, we may consider some additional testing at f/u, can she see me back sooner at 3 months please   [name] Ross MD ECHOCARDIOGRAM COMPLETE  Patient informed and verbalized understanding of plan. And routed to Front office to schedule f/u in result note.SABRASABRA

## 2023-11-20 DIAGNOSIS — I10 Essential (primary) hypertension: Secondary | ICD-10-CM | POA: Diagnosis not present

## 2023-11-20 DIAGNOSIS — I4891 Unspecified atrial fibrillation: Secondary | ICD-10-CM | POA: Diagnosis not present

## 2023-11-20 DIAGNOSIS — I4510 Unspecified right bundle-branch block: Secondary | ICD-10-CM | POA: Diagnosis not present

## 2023-11-20 DIAGNOSIS — R079 Chest pain, unspecified: Secondary | ICD-10-CM | POA: Diagnosis not present

## 2023-11-20 DIAGNOSIS — R000 Tachycardia, unspecified: Secondary | ICD-10-CM | POA: Diagnosis not present

## 2023-11-28 ENCOUNTER — Telehealth: Payer: Self-pay | Admitting: Internal Medicine

## 2023-11-28 NOTE — Telephone Encounter (Signed)
 Copied from CRM [id number]. Topic: Appointments - Transfer of Care >> Nov 28, 2023 10:42 AM Shona S wrote: Pt is requesting to transfer FROM: dr sood Pt is requesting to transfer TO: dr wert Reason for requested transfer: doctor no longer works at office It is the responsibility of the team the patient would like to transfer to (Dr. darlean) to reach out to the patient if for any reason this transfer is not acceptable.  TOC approval not necessary since Dr. Shellia is no longer with the practice

## 2023-11-29 ENCOUNTER — Other Ambulatory Visit: Payer: Self-pay | Admitting: Cardiology

## 2023-11-29 DIAGNOSIS — I4819 Other persistent atrial fibrillation: Secondary | ICD-10-CM

## 2023-11-29 NOTE — Telephone Encounter (Signed)
 Prescription refill request for Eliquis  received. Indication: Afib  Last office visit: 10/30/23 ([place])  Scr: 0.94 (03/10/23)  Age: 83 Weight: 53.2kg  Appropriate dose. Refill sent.

## 2023-12-16 DIAGNOSIS — E7800 Pure hypercholesterolemia, unspecified: Secondary | ICD-10-CM | POA: Diagnosis not present

## 2023-12-16 DIAGNOSIS — Z0000 Encounter for general adult medical examination without abnormal findings: Secondary | ICD-10-CM | POA: Diagnosis not present

## 2023-12-16 DIAGNOSIS — F332 Major depressive disorder, recurrent severe without psychotic features: Secondary | ICD-10-CM | POA: Diagnosis not present

## 2023-12-16 DIAGNOSIS — Z299 Encounter for prophylactic measures, unspecified: Secondary | ICD-10-CM | POA: Diagnosis not present

## 2023-12-16 DIAGNOSIS — Z79899 Other long term (current) drug therapy: Secondary | ICD-10-CM | POA: Diagnosis not present

## 2023-12-16 DIAGNOSIS — Z7189 Other specified counseling: Secondary | ICD-10-CM | POA: Diagnosis not present

## 2023-12-16 DIAGNOSIS — R5383 Other fatigue: Secondary | ICD-10-CM | POA: Diagnosis not present

## 2023-12-16 DIAGNOSIS — Z1331 Encounter for screening for depression: Secondary | ICD-10-CM | POA: Diagnosis not present

## 2023-12-16 DIAGNOSIS — Z1339 Encounter for screening examination for other mental health and behavioral disorders: Secondary | ICD-10-CM | POA: Diagnosis not present

## 2023-12-18 DIAGNOSIS — I10 Essential (primary) hypertension: Secondary | ICD-10-CM | POA: Diagnosis not present

## 2023-12-18 DIAGNOSIS — W19XXXA Unspecified fall, initial encounter: Secondary | ICD-10-CM | POA: Diagnosis not present

## 2023-12-18 DIAGNOSIS — R52 Pain, unspecified: Secondary | ICD-10-CM | POA: Diagnosis not present

## 2023-12-18 DIAGNOSIS — R609 Edema, unspecified: Secondary | ICD-10-CM | POA: Diagnosis not present

## 2023-12-18 DIAGNOSIS — M7989 Other specified soft tissue disorders: Secondary | ICD-10-CM | POA: Diagnosis not present

## 2023-12-18 DIAGNOSIS — M8588 Other specified disorders of bone density and structure, other site: Secondary | ICD-10-CM | POA: Diagnosis not present

## 2023-12-18 DIAGNOSIS — Z299 Encounter for prophylactic measures, unspecified: Secondary | ICD-10-CM | POA: Diagnosis not present

## 2023-12-18 DIAGNOSIS — S6992XA Unspecified injury of left wrist, hand and finger(s), initial encounter: Secondary | ICD-10-CM | POA: Diagnosis not present

## 2023-12-18 DIAGNOSIS — M19042 Primary osteoarthritis, left hand: Secondary | ICD-10-CM | POA: Diagnosis not present

## 2023-12-18 DIAGNOSIS — M1812 Unilateral primary osteoarthritis of first carpometacarpal joint, left hand: Secondary | ICD-10-CM | POA: Diagnosis not present

## 2023-12-24 DIAGNOSIS — S61211A Laceration without foreign body of left index finger without damage to nail, initial encounter: Secondary | ICD-10-CM | POA: Diagnosis not present

## 2023-12-24 DIAGNOSIS — Z6821 Body mass index (BMI) 21.0-21.9, adult: Secondary | ICD-10-CM | POA: Diagnosis not present

## 2023-12-24 DIAGNOSIS — I10 Essential (primary) hypertension: Secondary | ICD-10-CM | POA: Diagnosis not present

## 2023-12-25 DIAGNOSIS — I10 Essential (primary) hypertension: Secondary | ICD-10-CM | POA: Diagnosis not present

## 2024-01-02 ENCOUNTER — Other Ambulatory Visit: Payer: Self-pay | Admitting: Family Medicine

## 2024-01-02 DIAGNOSIS — Z1231 Encounter for screening mammogram for malignant neoplasm of breast: Secondary | ICD-10-CM

## 2024-01-05 NOTE — Progress Notes (Signed)
 [name] [name], female    DOB: 01/30/1941    MRN: [id number]   Brief patient profile:  41  yowf  never smoker former Sood last seen 11/2022 self-referred back to pulmonary clinic in [place]  01/07/2024  for asthma f/u    PFT 05/11/16 >> FEV1 1.47 (77%), ratio 0.68, TLC 4.84 (101%), DLCO 65%, +BD   CT chest 12/17/17 >> large hiatal hernia, small effusions, changes of cirrhosis CT sinus 12/26/20 >> clear sinuses     History of Present Illness  01/07/2024  Pulmonary/ 1st office eval/ [name] [name] / [name] not taking singulair   Chief Complaint  Patient presents with   COPD    F/u wheezing shob  Dyspnea:  fast walk  Cough: some x one week off inhaler  Sleep: bed if flat one pillow  SABA use: has not used it for a while either hfa neb with increased cough and doe since stopped using  02 [id number]:[id number]     No obvious day to day or daytime pattern/variability or assoc excess/ purulent sputum or mucus plugs or hemoptysis or cp or chest tightness, subjective wheeze or overt  hb symptoms.    Also denies any obvious fluctuation of symptoms with weather or environmental changes or other aggravating or alleviating factors except as outlined above   No unusual exposure hx or h/o childhood pna/ asthma or knowledge of premature birth.  Current Allergies, Complete Past Medical History, Past Surgical History, Family History, and Social History were reviewed in [name] [name] record.  ROS  The following are not active complaints unless bolded Hoarseness, sore throat, dysphagia, dental problems, itching, sneezing,  nasal congestion or discharge of excess mucus or purulent secretions, ear ache,   fever, chills, sweats, unintended wt loss or wt gain, classically pleuritic or exertional cp,  orthopnea pnd or arm/hand swelling  or leg swelling, presyncope, palpitations, abdominal pain, anorexia, nausea, vomiting, diarrhea  or change in bowel habits or change in bladder habits, change  in stools or change in urine, dysuria, hematuria,  rash, arthralgias, visual complaints, headache, numbness, weakness or ataxia or problems with walking or coordination,  change in mood or  memory.            Outpatient Medications Prior to Visit  Medication Sig Dispense Refill   acetaminophen  (TYLENOL ) 500 MG tablet Take 1,000 mg by mouth as needed for moderate pain or headache.     albuterol  (PROVENTIL ) (2.5 MG/3ML) 0.083% nebulizer solution Take 2.5 mg by nebulization every 6 (six) hours as needed for wheezing or shortness of breath.     albuterol  (VENTOLIN  HFA) 108 (90 Base) MCG/ACT inhaler Inhale 2 puffs into the lungs every 6 (six) hours as needed for wheezing or shortness of breath. 1 each 5   ALPRAZolam  (XANAX ) 0.5 MG tablet Take 0.5 mg by mouth at bedtime.     apixaban  (ELIQUIS ) 2.5 MG TABS tablet TAKE 1 TABLET(2.5 MG) BY MOUTH TWICE DAILY 60 tablet 5   amLODipine (NORVASC) 2.5 MG tablet Take 2.5 mg by mouth at bedtime.     Azelastine  HCl (ASTEPRO ) 0.15 % SOLN Place 1 spray into the nose daily. 30 mL 3   Budeson-Glycopyrrol-Formoterol  (BREZTRI  AEROSPHERE) 160-9-4.8 MCG/ACT AERO Inhale 2 puffs into the lungs in the morning and at bedtime. (Patient taking differently: Inhale 2 puffs into the lungs in the morning and at bedtime. As needed) 10.7 g 5   Calcium 500-2.5 MG-MCG CHEW Chew 1 tablet by mouth daily.     calcium carbonate (  OS-CAL) 600 MG TABS tablet Take 600 mg by mouth 2 (two) times daily with a meal.     cetirizine (ZYRTEC) 10 MG tablet Take 10 mg by mouth daily.     Cholecalciferol (VITAMIN D3) 25 MCG (1000 UT) CAPS Take 1 capsule by mouth daily.     Coenzyme Q10 100 MG TABS Take 100 mg by mouth every evening.      EPINEPHrine  0.3 mg/0.3 mL IJ SOAJ injection Inject 0.3 mg into the muscle as needed.     FLUoxetine (PROZAC) 20 MG capsule Take 20 mg by mouth daily.     fluticasone  (FLONASE ) 50 MCG/ACT nasal spray Place 1 spray into both nostrils daily. 16 g 5   furosemide  (LASIX )  20 MG tablet Take 1 tablet (20 mg total) by mouth as needed (swelling).     hydrocortisone  (ANUSOL -HC) 2.5 % rectal cream Place 1 Application rectally 2 (two) times daily. (Patient taking differently: Place 1 Application rectally as needed.) 30 g 1   levocetirizine (XYZAL) 5 MG tablet Take 5 mg by mouth daily.     levothyroxine  (SYNTHROID ) 75 MCG tablet TAKE 1/2 TABLET(37.5 MCG) BY MOUTH DAILY BEFORE BREAKFAST 45 tablet 3   metoprolol  tartrate (LOPRESSOR ) 25 MG tablet Take 0.5 tablets (12.5 mg total) by mouth 2 (two) times daily. (May take an additional 12.5mg  tab as needed.) 135 tablet 1   montelukast  (SINGULAIR ) 10 MG tablet Take 1 tablet (10 mg total) by mouth at bedtime. 90 tablet 1   Multiple Vitamin (MULTIVITAMIN) tablet Take 1 tablet by mouth daily.     ondansetron  (ZOFRAN -ODT) 4 MG disintegrating tablet Take 4 mg by mouth daily as needed.     pantoprazole  (PROTONIX ) 40 MG tablet TAKE 1 TABLET(40 MG) BY MOUTH DAILY 90 tablet 3   simvastatin (ZOCOR) 40 MG tablet Take 40 mg daily by mouth.     traMADol  (ULTRAM ) 50 MG tablet Take 50 mg by mouth 4 (four) times daily as needed.     No facility-administered medications prior to visit.    Past Medical History:  Diagnosis Date   Anxiety    Arthritis    Asthma    Atrial fibrillation, persistent ([name])    Chronic diastolic congestive heart failure ([name])    Colon adenoma    Colon cancer ([name]) 1999   status post low anterior resection, limited stage disease requiring no adjuvant therapy   COPD (chronic obstructive pulmonary disease) ([name])    Depression    Diverticulosis    DM (dermatomyositis)    DVT (deep venous thrombosis) ([name])    in leg- long time ago   Dyspnea    with activity   Dysrhythmia    Esophageal dysphagia    GERD (gastroesophageal reflux disease)    Headache    Heart murmur    Hematuria    Hemorrhoids    Hiatal hernia    History of kidney stones    x 2   Hypercholesterolemia    Incidental pulmonary nodule  05/08/2016   8 mm vague opacity RML noted on CT scan   Mitral regurgitation    PONV (postoperative nausea and vomiting) 2003 ish    with breast biopsy   S/P minimally invasive maze operation for atrial fibrillation 07/05/2016   Complete bilateral atrial lesion set using cryothermy and bipolar radiofrequency ablation with clipping of LA appendage via right mini thoracotomy approach   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/05/2016   Complex valvuloplasty including artificial Gore-tex neochord placement x6 and 30  mm Sorin Memo 3D ring annuloplasty via right mini thoracotomy approach   Schatzki's ring    Tricuspid regurgitation       Objective:     BP (!) 162/95   Pulse 67   Ht 5' 1 (1.549 m)   Wt 113 lb 3.2 oz (51.3 kg)   SpO2 100% Comment: ra  BMI 21.39 kg/m   SpO2: 100 % (ra) amb pleasant wf easily confused with details of care   HEENT : Oropharynx  nl         NECK :  without  apparent JVD/ palpable Nodes/TM    LUNGS: no acc muscle use,  Nl contour chest which is clear to A and P bilaterally without cough on insp or exp maneuvers   CV:  RRR  no s3 or murmur or increase in P2, and no edema   ABD:  soft and nontender   MS:  Gait nl   ext warm without deformities Or obvious joint restrictions  calf tenderness, cyanosis or clubbing    SKIN: warm and dry without lesions    NEURO:  alert, approp, nl sensorium with  no motor or cerebellar deficits apparent.       Assessment   Assessment & Plan COPD with asthma ([name])  History of pneumonia  Mild persistent asthma without complication Never smoker - PFT 05/11/16 >> FEV1 1.47 (77%), ratio 0.68, TLC 4.84 (101%), DLCO 65%, +BD - 01/07/2024  After extensive coaching inhaler device,  effectiveness =    50% from a baseline of 0 > try symbicort  80 2bid  and d/c breztri /singulair  (not taking them anyway)  She barely meets the criteria for obstruction but clinically has mild asthma and not copd so no need for  breztri   Rec: Trial of symbicort  80 2bid and prn saba  F/u in 6 weeks with all meds in hand using a trust but verify approach to confirm accurate Medication  Reconciliation The principal here is that until we are certain that the  patients are doing what we've asked, it makes no sense to ask them to do more.          Each maintenance medication was reviewed in detail including emphasizing most importantly the difference between maintenance and prns and under what circumstances the prns are to be triggered using an action plan format where appropriate.  Total time for H and P, chart review, counseling, reviewing hfa/ neb device(s) and generating customized AVS unique to this office visit / same day charting = 42 min with pt new to me with major issues understanding how /when to take what meds.         AVS  Patient Instructions  Plan A = Automatic = Always=    Symbicort  80 Take 2 (or breztri  take one) puffs first thing in am and then another 2 puffs about 12 hours later.    Work on inhaler technique:  relax and gently blow all the way out then take a nice smooth full deep breath back in, triggering the inhaler at same time you start breathing in.  Hold breath in for at least  5 seconds if you can. Blow out Symbicort   thru nose. Rinse and gargle with water  when done.  If mouth or throat bother you at all,  try brushing teeth/gums/tongue with arm and hammer toothpaste/ make a slurry and gargle and spit out.      Plan B = Backup (to supplement plan A, not to replace it) Use your albuterol   inhaler as a rescue medication to be used if you can't catch your breath by resting or slowing your pace  or doing a relaxed purse lip breathing pattern.  - The less you use it, the better it will work when you need it. - Ok to use the inhaler up to 2 puffs  every 4 hours if you must but call for appointment if use goes up over your usual need - Don't leave home without it !!  (think of it like the spare tire  or starter fluid for your car)   Plan C = Crisis (instead of Plan B but only if Plan B stops working) - only use your albuterol  nebulizer if you first try Plan B and it fails to help > ok to use the nebulizer up to every 4 hours but if start needing it regularly call for immediate appointment   Please schedule a follow up office visit in 6 weeks, call sooner if needed with all medications /inhalers/ solutions in hand so we can verify exactly what you are taking. This includes all medications from all doctors and over the counters          Ozell America, MD 01/07/2024

## 2024-01-07 ENCOUNTER — Encounter: Payer: Self-pay | Admitting: Internal Medicine

## 2024-01-07 ENCOUNTER — Ambulatory Visit (INDEPENDENT_AMBULATORY_CARE_PROVIDER_SITE_OTHER): Admitting: Internal Medicine

## 2024-01-07 VITALS — BP 162/95 | HR 67 | Ht 61.0 in | Wt 113.2 lb

## 2024-01-07 DIAGNOSIS — Z8701 Personal history of pneumonia (recurrent): Secondary | ICD-10-CM

## 2024-01-07 DIAGNOSIS — J4489 Other specified chronic obstructive pulmonary disease: Secondary | ICD-10-CM

## 2024-01-07 DIAGNOSIS — J4530 Mild persistent asthma, uncomplicated: Secondary | ICD-10-CM | POA: Diagnosis not present

## 2024-01-07 MED ORDER — BREZTRI AEROSPHERE 160-9-4.8 MCG/ACT IN AERO: 2.0000 | INHALATION_SPRAY | Freq: Two times a day (BID) | RESPIRATORY_TRACT | Status: DC

## 2024-01-07 MED ORDER — BUDESONIDE-FORMOTEROL FUMARATE 80-4.5 MCG/ACT IN AERO: INHALATION_SPRAY | RESPIRATORY_TRACT | 12 refills | Status: AC

## 2024-01-07 NOTE — Patient Instructions (Addendum)
 Plan A = Automatic = Always=    Symbicort  80 Take 2 (or breztri  take one) puffs first thing in am and then another 2 puffs about 12 hours later.    Work on inhaler technique:  relax and gently blow all the way out then take a nice smooth full deep breath back in, triggering the inhaler at same time you start breathing in.  Hold breath in for at least  5 seconds if you can. Blow out Symbicort   thru nose. Rinse and gargle with water  when done.  If mouth or throat bother you at all,  try brushing teeth/gums/tongue with arm and hammer toothpaste/ make a slurry and gargle and spit out.      Plan B = Backup (to supplement plan A, not to replace it) Use your albuterol  inhaler as a rescue medication to be used if you can't catch your breath by resting or slowing your pace  or doing a relaxed purse lip breathing pattern.  - The less you use it, the better it will work when you need it. - Ok to use the inhaler up to 2 puffs  every 4 hours if you must but call for appointment if use goes up over your usual need - Don't leave home without it !!  (think of it like the spare tire or starter fluid for your car)   Plan C = Crisis (instead of Plan B but only if Plan B stops working) - only use your albuterol  nebulizer if you first try Plan B and it fails to help > ok to use the nebulizer up to every 4 hours but if start needing it regularly call for immediate appointment   Please schedule a follow up office visit in 6 weeks, call sooner if needed with all medications /inhalers/ solutions in hand so we can verify exactly what you are taking. This includes all medications from all doctors and over the counters

## 2024-01-07 NOTE — Assessment & Plan Note (Addendum)
 Never smoker - PFT 05/11/16 >> FEV1 1.47 (77%), ratio 0.68, TLC 4.84 (101%), DLCO 65%, +BD - 01/07/2024  After extensive coaching inhaler device,  effectiveness =    50% from a baseline of 0 > try symbicort  80 2bid  and d/c breztri /singulair  (not taking them anyway)  She barely meets the criteria for obstruction but clinically has mild asthma and not copd so no need for breztri   Rec: Trial of symbicort  80 2bid and prn saba  F/u in 6 weeks with all meds in hand using a trust but verify approach to confirm accurate Medication  Reconciliation The principal here is that until we are certain that the  patients are doing what we've asked, it makes no sense to ask them to do more.          Each maintenance medication was reviewed in detail including emphasizing most importantly the difference between maintenance and prns and under what circumstances the prns are to be triggered using an action plan format where appropriate.  Total time for H and P, chart review, counseling, reviewing hfa/ neb device(s) and generating customized AVS unique to this office visit / same day charting = 42 min with pt new to me with major issues understanding how /when to take what meds.

## 2024-01-08 ENCOUNTER — Other Ambulatory Visit: Payer: Self-pay

## 2024-01-08 MED ORDER — BUDESONIDE-FORMOTEROL FUMARATE 80-4.5 MCG/ACT IN AERO: 2.0000 | INHALATION_SPRAY | Freq: Two times a day (BID) | RESPIRATORY_TRACT | 12 refills | Status: AC

## 2024-01-14 ENCOUNTER — Encounter

## 2024-02-07 ENCOUNTER — Ambulatory Visit: Admitting: Cardiology

## 2024-02-13 ENCOUNTER — Ambulatory Visit: Attending: Nurse Practitioner | Admitting: Nurse Practitioner

## 2024-02-13 ENCOUNTER — Encounter: Payer: Self-pay | Admitting: Nurse Practitioner

## 2024-02-13 VITALS — BP 116/68 | HR 88 | Ht 61.0 in | Wt 114.0 lb

## 2024-02-13 DIAGNOSIS — I5032 Chronic diastolic (congestive) heart failure: Secondary | ICD-10-CM | POA: Insufficient documentation

## 2024-02-13 DIAGNOSIS — I4892 Unspecified atrial flutter: Secondary | ICD-10-CM | POA: Diagnosis present

## 2024-02-13 DIAGNOSIS — I495 Sick sinus syndrome: Secondary | ICD-10-CM | POA: Diagnosis present

## 2024-02-13 DIAGNOSIS — J449 Chronic obstructive pulmonary disease, unspecified: Secondary | ICD-10-CM | POA: Diagnosis present

## 2024-02-13 DIAGNOSIS — R0609 Other forms of dyspnea: Secondary | ICD-10-CM | POA: Insufficient documentation

## 2024-02-13 DIAGNOSIS — I272 Pulmonary hypertension, unspecified: Secondary | ICD-10-CM | POA: Insufficient documentation

## 2024-02-13 DIAGNOSIS — I480 Paroxysmal atrial fibrillation: Secondary | ICD-10-CM | POA: Insufficient documentation

## 2024-02-13 DIAGNOSIS — R0602 Shortness of breath: Secondary | ICD-10-CM

## 2024-02-13 MED ORDER — FUROSEMIDE 20 MG PO TABS: 20.0000 mg | ORAL_TABLET | ORAL | 1 refills | Status: AC | PRN

## 2024-02-13 NOTE — Progress Notes (Signed)
 Cardiology Office Note   Date: 02/13/2024 ID:  [name] [name], DOB [birth year], [name] PCP: Leavy Waddell NOVAK, FNP  [facility] Providers Cardiologist:  Alvan Carrier, MD Electrophysiologist:  Danelle Waddell, MD     History of Present Illness [name] [name] is a 83 y.o. female with a PMH of paroxysmal A-fib/A-flutter, chest pain, sinus node dysfunction, mitral valve regurgitation, s/p MV repair in 2018, chronic diastolic CHF, shortness of breath, COPD, history of GI bleed, venous insufficiency, and past history of angioedema, who presents today for 35-month follow-up appointment.  Last seen by Dr. Alvan on October 30, 2023.  Medical management was limited due to history of sinus node dysfunction had prior bradycardia that was noted on amiodarone , was felt that she might need pacemaker at some point and was being followed by EP.  Overall was doing well.  Echo was arranged - see report below.   She is here for follow-up. Doing pretty well per her report. Does admit to worsening dyspnea on exertion.  Only taking Lasix  as needed.  She does admit to snoring at night and fatigue. Denies any chest pain, palpitations, syncope, presyncope, dizziness, orthopnea, PND, swelling or significant weight changes, acute bleeding, or claudication.  ROS: Negative.  See HPI.  Studies Reviewed  EKG: EKG is not ordered today.  Echo 10/2023:  1. Left ventricular ejection fraction, by estimation, is 60 to 65%. The  left ventricle has normal function. The left ventricle has no regional  wall motion abnormalities. There is mild concentric left ventricular  hypertrophy. Left ventricular diastolic  parameters are indeterminate. There is the interventricular septum is  flattened in diastole ('D' shaped left ventricle), consistent with right  ventricular volume overload.   2. Right ventricular systolic function is moderately reduced. The right  ventricular size is severely enlarged. There is  moderately elevated  pulmonary artery systolic pressure. The estimated right ventricular  systolic pressure is 57.5 mmHg.   3. Left atrial size was severely dilated.   4. Right atrial size was markedly dilated.   5. The mitral valve has been repaired/replaced. Trivial mitral valve  regurgitation. The mean mitral valve gradient is 6.0 mmHg. There is a 30  mm Sorin Memo 3D prosthetic annuloplasty ring present in the mitral  position.   6. Tricuspid valve regurgitation is severe.   7. The aortic valve is tricuspid. There is moderate calcification of the  aortic valve. Aortic valve regurgitation is not visualized. Mild aortic  valve stenosis. Aortic valve mean gradient measures 13.0 mmHg.   8. The inferior vena cava is dilated in size with <50% respiratory  variability, suggesting right atrial pressure of 15 mmHg.   Comparison(s): A prior study was performed on 10/26/2016. Prior images  unable to be directly viewed.    Cardiac monitor 04/2019:  7 day event monitor Min HR 54, Max HR 143, Avg HR 73 Reported symptoms corerlated with sinus rhythm with rare PACs No significant arrhythmias  Right/left heart cath 03/2016:  There is severe (4+) mitral regurgitation. Hemodynamic findings consistent with mild pulmonary hypertension. Angiographic minimal coronary disease. 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 The left ventricular ejection fraction is greater than 65% by visual estimate.   Angiographically only minimal CAD. LV gram did confirm severe MR. Relative normal right heart cath pressures with minimal pulmonary hypertension.   Recommendation: The patient will now be referred to cardiac surgery for consultation to discuss mitral valve repair. TR band removal per protocol. The brachial sheath  is removed in the Cath Lab.   Okay to discharge later today.    Physical Exam VS:  BP 116/68   Pulse 88   Ht 5' 1 (1.549 m)   Wt 114 lb (51.7 kg)   SpO2 98%   BMI  21.54 kg/m        Wt Readings from Last 3 Encounters:  02/13/24 114 lb (51.7 kg)  01/07/24 113 lb 3.2 oz (51.3 kg)  11/08/23 117 lb 3.2 oz (53.2 kg)    GEN: Thin, frail 83 year old female in no acute distress NECK: No JVD; No carotid bruits CARDIAC: S1/S2, RRR, no murmurs, rubs, gallops RESPIRATORY:  Clear to auscultation without rales, wheezing or rhonchi  ABDOMEN: Soft, non-tender, non-distended EXTREMITIES:  No edema; No deformity   ASSESSMENT AND PLAN  PAF/A-flutter, sinus node dysfunction Denies any tachycardia or palpitations.  Heart rate is 88 today.  Prior history of maze procedure during MV repair and management has been complicated by sinus node dysfunction.  It was previously stated she may need pacemaker at some point, was followed by EP.  She has not seen EP in several years.   Will provide referral.  Chronic diastolic CHF, DOE Stage C, NYHA class II-III symptoms.  EF normal from July 2025. Euvolemic and well compensated on exam.  Does admit to some worsening dyspnea on exertion.  Instructed her to take Lasix  20 mg daily for the next week, then switch to as needed. Will obtain BMET in 1 week.  If this does not seem to make a difference in her symptoms, more than likely this is related to her COPD.  No other medication changes at this time.  Low sodium diet, fluid restriction <2L, and daily weights encouraged. Educated to contact our office for weight gain of 2 lbs overnight or 5 lbs in one week.  COPD, pulmonary hypertension  Most recent echo showed some signs of pulmonary hypertension that was likely felt to be related to her COPD.  Estimated right ventricular systolic pressure is 57.5 mmHg.  Will arrange PFTs for further evaluation of her COPD.  Felt to be related to [facility] group 2/3. Continue to follow-up with pulmonology. Care and ED precautions discussed.    Dispo: Care and ED precautions discussed.  Follow-up with MD/APP in 6 to 8 weeks or sooner if any  changes. Signed, Almarie Crate, NP

## 2024-02-13 NOTE — Patient Instructions (Addendum)
 Medication Instructions:  Your physician has recommended you make the following change in your medication:  Please Take Lasix  20 Mg daily for 1 week then reduce back to as needed.  Labwork: In 1 week at [facility]   Testing/Procedures: Your physician has recommended that you have a pulmonary function test. Pulmonary Function Tests are a group of tests that measure how well air moves in and out of your lungs.  Follow-Up: Your physician recommends that you schedule a follow-up appointment in: 6-8 weeks   Any Other Special Instructions Will Be Listed Below (If Applicable).  If you need a refill on your cardiac medications before your next appointment, please call your pharmacy.

## 2024-02-21 DIAGNOSIS — I5032 Chronic diastolic (congestive) heart failure: Secondary | ICD-10-CM | POA: Diagnosis not present

## 2024-02-21 DIAGNOSIS — R0602 Shortness of breath: Secondary | ICD-10-CM | POA: Diagnosis not present

## 2024-02-26 ENCOUNTER — Ambulatory Visit: Admitting: Internal Medicine

## 2024-03-17 DIAGNOSIS — I10 Essential (primary) hypertension: Secondary | ICD-10-CM | POA: Diagnosis not present

## 2024-03-17 DIAGNOSIS — Z299 Encounter for prophylactic measures, unspecified: Secondary | ICD-10-CM | POA: Diagnosis not present

## 2024-03-17 DIAGNOSIS — F419 Anxiety disorder, unspecified: Secondary | ICD-10-CM | POA: Diagnosis not present

## 2024-03-17 DIAGNOSIS — I5032 Chronic diastolic (congestive) heart failure: Secondary | ICD-10-CM | POA: Diagnosis not present

## 2024-03-17 DIAGNOSIS — I4891 Unspecified atrial fibrillation: Secondary | ICD-10-CM | POA: Diagnosis not present

## 2024-03-26 ENCOUNTER — Telehealth: Payer: Self-pay | Admitting: Internal Medicine

## 2024-03-26 DIAGNOSIS — M25542 Pain in joints of left hand: Secondary | ICD-10-CM | POA: Diagnosis not present

## 2024-03-26 NOTE — Telephone Encounter (Signed)
 Spoke with patient --she rescheduled her appointment to Tuesday 05/05/24 with Dr. Darlean

## 2024-03-26 NOTE — Telephone Encounter (Signed)
 LVM for patient to call and discuss 03/27/24 8:45 am appointment with Dr. Hortencia patient planning on keeping appointment or rescheduling

## 2024-03-27 ENCOUNTER — Ambulatory Visit: Admitting: Internal Medicine

## 2024-04-02 ENCOUNTER — Encounter: Payer: Self-pay | Admitting: Nurse Practitioner

## 2024-04-02 ENCOUNTER — Ambulatory Visit: Attending: Nurse Practitioner | Admitting: Nurse Practitioner

## 2024-04-02 VITALS — BP 120/70 | HR 83 | Ht 61.0 in | Wt 119.4 lb

## 2024-04-02 DIAGNOSIS — I272 Pulmonary hypertension, unspecified: Secondary | ICD-10-CM | POA: Diagnosis present

## 2024-04-02 DIAGNOSIS — I480 Paroxysmal atrial fibrillation: Secondary | ICD-10-CM | POA: Diagnosis present

## 2024-04-02 DIAGNOSIS — I495 Sick sinus syndrome: Secondary | ICD-10-CM | POA: Insufficient documentation

## 2024-04-02 DIAGNOSIS — J449 Chronic obstructive pulmonary disease, unspecified: Secondary | ICD-10-CM | POA: Diagnosis present

## 2024-04-02 DIAGNOSIS — I5032 Chronic diastolic (congestive) heart failure: Secondary | ICD-10-CM | POA: Diagnosis present

## 2024-04-02 DIAGNOSIS — R0609 Other forms of dyspnea: Secondary | ICD-10-CM | POA: Insufficient documentation

## 2024-04-02 DIAGNOSIS — I4892 Unspecified atrial flutter: Secondary | ICD-10-CM | POA: Diagnosis present

## 2024-04-02 NOTE — Patient Instructions (Signed)
 Medication Instructions:  Continue all current medications.   Labwork: none  Testing/Procedures: none  Follow-Up: 6 months   Any Other Special Instructions Will Be Listed Below (If Applicable).   If you need a refill on your cardiac medications before your next appointment, please call your pharmacy.

## 2024-04-02 NOTE — Progress Notes (Signed)
 Cardiology Office Note   Date: 04/02/2024 ID:  [name] [name], DOB Apr 17, 1941, [name] PCP: Leavy Waddell NOVAK, FNP  [facility] Providers Cardiologist:  Alvan Carrier, MD Electrophysiologist:  Danelle Waddell, MD     History of Present Illness [name] [name] is a 83 y.o. female with a PMH of paroxysmal A-fib/A-flutter, chest pain, sinus node dysfunction, mitral valve regurgitation, s/p MV repair in 2018, chronic diastolic CHF, shortness of breath, COPD, history of GI bleed, venous insufficiency, and past history of angioedema, who presents today for 6-8 week follow-up appointment.  Last seen by Dr. Alvan on October 30, 2023.  Medical management was limited due to history of sinus node dysfunction had prior bradycardia that was noted on amiodarone , was felt that she might need pacemaker at some point and was being followed by EP.  Overall was doing well.  Echo was arranged - see report below.   02/13/2024 - She is here for follow-up. Doing pretty well per her report. Does admit to worsening dyspnea on exertion.  Only taking Lasix  as needed.  She does admit to snoring at night and fatigue. Denies any chest pain, palpitations, syncope, presyncope, dizziness, orthopnea, PND, swelling or significant weight changes, acute bleeding, or claudication.  04/02/2024 - Here for follow-up. Doing well. Breathing is stable. Denies any chest pain, shortness of breath, palpitations, syncope, presyncope, dizziness, orthopnea, PND, swelling or significant weight changes, acute bleeding, or claudication.  ROS: Negative.  See HPI.  Studies Reviewed  EKG: EKG is not ordered today.  Echo 10/2023:  1. Left ventricular ejection fraction, by estimation, is 60 to 65%. The  left ventricle has normal function. The left ventricle has no regional  wall motion abnormalities. There is mild concentric left ventricular  hypertrophy. Left ventricular diastolic  parameters are indeterminate. There is  the interventricular septum is  flattened in diastole ('D' shaped left ventricle), consistent with right  ventricular volume overload.   2. Right ventricular systolic function is moderately reduced. The right  ventricular size is severely enlarged. There is moderately elevated  pulmonary artery systolic pressure. The estimated right ventricular  systolic pressure is 57.5 mmHg.   3. Left atrial size was severely dilated.   4. Right atrial size was markedly dilated.   5. The mitral valve has been repaired/replaced. Trivial mitral valve  regurgitation. The mean mitral valve gradient is 6.0 mmHg. There is a 30  mm Sorin Memo 3D prosthetic annuloplasty ring present in the mitral  position.   6. Tricuspid valve regurgitation is severe.   7. The aortic valve is tricuspid. There is moderate calcification of the  aortic valve. Aortic valve regurgitation is not visualized. Mild aortic  valve stenosis. Aortic valve mean gradient measures 13.0 mmHg.   8. The inferior vena cava is dilated in size with <50% respiratory  variability, suggesting right atrial pressure of 15 mmHg.   Comparison(s): A prior study was performed on 10/26/2016. Prior images  unable to be directly viewed.    Cardiac monitor 04/2019:  7 day event monitor Min HR 54, Max HR 143, Avg HR 73 Reported symptoms corerlated with sinus rhythm with rare PACs No significant arrhythmias  Right/left heart cath 03/2016:  There is severe (4+) mitral regurgitation. Hemodynamic findings consistent with mild pulmonary hypertension. Angiographic minimal coronary disease. 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 The left ventricular ejection fraction is greater than 65% by visual estimate.   Angiographically only minimal CAD. LV gram did confirm severe MR. Relative  normal right heart cath pressures with minimal pulmonary hypertension.   Recommendation: The patient will now be referred to cardiac surgery for  consultation to discuss mitral valve repair. TR band removal per protocol. The brachial sheath is removed in the Cath Lab.   Okay to discharge later today.    Physical Exam VS:  BP 120/70   Pulse 83   Ht 5' 1 (1.549 m)   Wt 119 lb 6.4 oz (54.2 kg)   SpO2 99%   BMI 22.56 kg/m        Wt Readings from Last 3 Encounters:  04/02/24 119 lb 6.4 oz (54.2 kg)  02/13/24 114 lb (51.7 kg)  01/07/24 113 lb 3.2 oz (51.3 kg)    GEN: Thin, frail 83 year old female in no acute distress NECK: No JVD; No carotid bruits CARDIAC: S1/S2, RRR, no murmurs, rubs, gallops RESPIRATORY:  Clear to auscultation without rales, wheezing or rhonchi  ABDOMEN: Soft, non-tender, non-distended EXTREMITIES:  No edema; No deformity   ASSESSMENT AND PLAN  PAF/A-flutter, sinus node dysfunction Denies any tachycardia or palpitations.  Heart rate is 88 today.  Prior history of maze procedure during MV repair and management has been complicated by sinus node dysfunction.  It was previously stated she may need pacemaker at some point, was followed by EP.  Follow-up with EP as scheduled.   Chronic diastolic CHF, DOE Stage C, NYHA class II-III symptoms.  EF normal from July 2025. Euvolemic and well compensated on exam.  Does admit to some stable dyspnea on exertion- seems to be related to COPD.  No medication changes at this time.  Low sodium diet, fluid restriction <2L, and daily weights encouraged. Educated to contact our office for weight gain of 2 lbs overnight or 5 lbs in one week.  COPD, pulmonary hypertension  Most recent echo showed some signs of pulmonary hypertension that was likely felt to be related to her COPD.  Estimated right ventricular systolic pressure is 57.5 mmHg.  Felt to be related to [facility] group 2/3. Continue to follow-up with pulmonology. Care and ED precautions discussed.    Dispo: Care and ED precautions discussed.  Follow-up with MD/APP in 6 months or sooner if any changes.  Signed, Almarie Crate, NP

## 2024-04-10 ENCOUNTER — Ambulatory Visit: Admitting: Nurse Practitioner

## 2024-05-05 ENCOUNTER — Ambulatory Visit: Admitting: Internal Medicine

## 2024-05-05 ENCOUNTER — Encounter: Payer: Self-pay | Admitting: Internal Medicine

## 2024-05-05 VITALS — BP 121/80 | HR 94 | Ht 61.0 in | Wt 120.0 lb

## 2024-05-05 DIAGNOSIS — J4489 Other specified chronic obstructive pulmonary disease: Secondary | ICD-10-CM

## 2024-05-05 DIAGNOSIS — J4530 Mild persistent asthma, uncomplicated: Secondary | ICD-10-CM

## 2024-05-05 MED ORDER — BREZTRI AEROSPHERE 160-9-4.8 MCG/ACT IN AERO: 2.0000 | INHALATION_SPRAY | Freq: Two times a day (BID) | RESPIRATORY_TRACT | Status: AC

## 2024-05-05 NOTE — Progress Notes (Unsigned)
 "   [name] [name], female    DOB: 1940/04/26    MRN: [id number]   Brief patient profile:  73 yowf  never smoker former [facility] pt  last seen 11/2022 self-referred back to pulmonary clinic in [place]  01/07/2024  for asthma f/u    PFT 05/11/16 >> FEV1 1.47 (77%), ratio 0.68, TLC 4.84 (101%), DLCO 65%, +BD   CT chest 12/17/17 >> large hiatal hernia, small effusions, changes of cirrhosis CT sinus 12/26/20 >> clear sinuses     History of Present Illness  01/07/2024  Pulmonary/ 1st office eval/ [facility] [place] / [place] [facility] not taking singulair   Chief Complaint  Patient presents with   COPD    F/u wheezing shob  Dyspnea:  fast walk  Cough: some x one week off inhaler  Sleep: bed if flat one pillow  SABA use: has not used it for a while either hfa neb with increased cough and doe since stopped using  02 [id number]:[id number]  Patient Instructions  Plan A = Automatic = Always=    Symbicort  80   Work on inhaler technique:  Plan B = Backup (to supplement plan A, not to replace it) Use your albuterol  inhaler as a rescue medication Plan C = Crisis (instead of Plan B but only if Plan B stops working) - only use your albuterol  nebulizer if you first try Plan B Please schedule a follow up office visit in 6 weeks, call sooner if needed with all medications /inhalers/ solutions in hand   05/05/2024  f/u ov/[place] [facility]/Isadore Bokhari re: mild/mod persistent asthma maint on symbicort  80  did not  bring meds  Chief Complaint  Patient presents with   COPD    Asthma  DOE  Dyspnea:  walking push buggy [employer] ok  Cough: none  Sleeping: flat bed one pillow s   resp cc  SABA use: rarely hfa/ never nebulizer  02: none     No obvious day to day or daytime variability or assoc excess/ purulent sputum or mucus plugs or hemoptysis or cp or chest tightness, subjective wheeze or overt sinus or hb symptoms.    Also denies any obvious fluctuation of symptoms with weather or environmental changes or other aggravating or  alleviating factors except as outlined above   No unusual exposure hx or h/o childhood pna/ asthma or knowledge of premature birth.  Current Allergies, Complete Past Medical History, Past Surgical History, Family History, and Social History were reviewed in [name] record.  ROS  The following are not active complaints unless bolded Hoarseness, sore throat, dysphagia, dental problems, itching, sneezing,  nasal congestion or discharge of excess mucus or purulent secretions, ear ache,   fever, chills, sweats, unintended wt loss or wt gain, classically pleuritic or exertional cp,  orthopnea pnd or arm/hand swelling  or leg swelling, presyncope, palpitations, abdominal pain, anorexia, nausea, vomiting, diarrhea  or change in bowel habits or change in bladder habits, change in stools or change in urine, dysuria, hematuria,  rash, arthralgias, visual complaints, headache, numbness, weakness or ataxia or problems with walking or coordination,  change in mood or  memory.         Outpatient Medications Prior to Visit  Medication Sig Dispense Refill   acetaminophen  (TYLENOL ) 500 MG tablet Take 1,000 mg by mouth as needed for moderate pain or headache.     albuterol  (VENTOLIN  HFA) 108 (90 Base) MCG/ACT inhaler Inhale 2 puffs into the lungs every 6 (six) hours as needed for wheezing or shortness  of breath. 1 each 5   ALPRAZolam  (XANAX ) 0.5 MG tablet Take 0.5 mg by mouth at bedtime.     apixaban  (ELIQUIS ) 2.5 MG TABS tablet TAKE 1 TABLET(2.5 MG) BY MOUTH TWICE DAILY 60 tablet 5   budesonide -formoterol  (BREYNA ) 80-4.5 MCG/ACT inhaler Inhale 2 puffs into the lungs 2 (two) times daily. 1 each 12   Cholecalciferol (VITAMIN D3) 25 MCG (1000 UT) CAPS Take 1 capsule by mouth daily.     EPINEPHrine  0.3 mg/0.3 mL IJ SOAJ injection Inject 0.3 mg into the muscle as needed.     furosemide  (LASIX ) 20 MG tablet Take 1 tablet (20 mg total) by mouth as needed (swelling). 90 tablet 1   levothyroxine   (SYNTHROID ) 75 MCG tablet TAKE 1/2 TABLET(37.5 MCG) BY MOUTH DAILY BEFORE BREAKFAST 45 tablet 3   metoprolol  tartrate (LOPRESSOR ) 25 MG tablet Take 0.5 tablets (12.5 mg total) by mouth 2 (two) times daily. (May take an additional 12.5mg  tab as needed.) 135 tablet 1   Multiple Vitamin (MULTIVITAMIN) tablet Take 1 tablet by mouth daily.     traMADol  (ULTRAM ) 50 MG tablet Take 50 mg by mouth 4 (four) times daily as needed.     albuterol  (PROVENTIL ) (2.5 MG/3ML) 0.083% nebulizer solution Take 2.5 mg by nebulization every 6 (six) hours as needed for wheezing or shortness of breath. (Patient not taking: Reported on 05/05/2024)     amLODipine (NORVASC) 2.5 MG tablet Take 2.5 mg by mouth at bedtime. (Patient not taking: Reported on 05/05/2024)     Azelastine  HCl (ASTEPRO ) 0.15 % SOLN Place 1 spray into the nose daily. (Patient not taking: Reported on 05/05/2024) 30 mL 3   budesonide -formoterol  (SYMBICORT ) 80-4.5 MCG/ACT inhaler Take 2 puffs first thing in am and then another 2 puffs about 12 hours later. (Patient not taking: Reported on 05/05/2024) 1 each 12   Calcium 500-2.5 MG-MCG CHEW Chew 1 tablet by mouth daily. (Patient not taking: Reported on 05/05/2024)     calcium carbonate (OS-CAL) 600 MG TABS tablet Take 600 mg by mouth 2 (two) times daily with a meal. (Patient not taking: Reported on 05/05/2024)     cetirizine (ZYRTEC) 10 MG tablet Take 10 mg by mouth daily. (Patient not taking: Reported on 05/05/2024)     Coenzyme Q10 100 MG TABS Take 100 mg by mouth every evening.  (Patient not taking: Reported on 05/05/2024)     FLUoxetine (PROZAC) 20 MG capsule Take 20 mg by mouth daily. (Patient not taking: Reported on 05/05/2024)     fluticasone  (FLONASE ) 50 MCG/ACT nasal spray Place 1 spray into both nostrils daily. (Patient not taking: Reported on 05/05/2024) 16 g 5   hydrocortisone  (ANUSOL -HC) 2.5 % rectal cream Place 1 Application rectally 2 (two) times daily. (Patient not taking: Reported on 05/05/2024) 30 g 1    levocetirizine (XYZAL) 5 MG tablet Take 5 mg by mouth daily. (Patient not taking: Reported on 05/05/2024)     ondansetron  (ZOFRAN -ODT) 4 MG disintegrating tablet Take 4 mg by mouth daily as needed. (Patient not taking: Reported on 05/05/2024)     pantoprazole  (PROTONIX ) 40 MG tablet TAKE 1 TABLET(40 MG) BY MOUTH DAILY (Patient not taking: Reported on 05/05/2024) 90 tablet 3   simvastatin (ZOCOR) 40 MG tablet Take 40 mg daily by mouth. (Patient not taking: Reported on 05/05/2024)     No facility-administered medications prior to visit.          Past Medical History:  Diagnosis Date   Anxiety    Arthritis  Asthma    Atrial fibrillation, persistent ([name])    Chronic diastolic congestive heart failure ([name])    Colon adenoma    Colon cancer ([name]) 1999   status post low anterior resection, limited stage disease requiring no adjuvant therapy   COPD (chronic obstructive pulmonary disease) ([name])    Depression    Diverticulosis    DM (dermatomyositis)    DVT (deep venous thrombosis) ([name])    in leg- long time ago   Dyspnea    with activity   Dysrhythmia    Esophageal dysphagia    GERD (gastroesophageal reflux disease)    Headache    Heart murmur    Hematuria    Hemorrhoids    Hiatal hernia    History of kidney stones    x 2   Hypercholesterolemia    Incidental pulmonary nodule 05/08/2016   8 mm vague opacity RML noted on CT scan   Mitral regurgitation    PONV (postoperative nausea and vomiting) 2003 ish    with breast biopsy   S/P minimally invasive maze operation for atrial fibrillation 07/05/2016   Complete bilateral atrial lesion set using cryothermy and bipolar radiofrequency ablation with clipping of LA appendage via right mini thoracotomy approach   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/05/2016   Complex valvuloplasty including artificial Gore-tex neochord placement x6 and 30 mm Sorin Memo 3D ring annuloplasty via right mini thoracotomy approach   Schatzki's ring     Tricuspid regurgitation       Objective:    Wt Readings from Last 3 Encounters:  05/05/24 120 lb (54.4 kg)  04/02/24 119 lb 6.4 oz (54.2 kg)  02/13/24 114 lb (51.7 kg)      Vital signs reviewed  05/05/2024  - Note at rest 02 sats  98% on RA   General appearance:    amb wf nad,somewhat of a silly affect  and very easily confused with details of care   HEENT : Oropharynx  clear       NECK :  without  apparent JVD/ palpable Nodes/TM    LUNGS: no acc muscle use,  Min barrel  contour chest wall with bilateral  slightly decreased bs s audible wheeze and  without cough on insp or exp maneuvers and min  Hyperresonant  to  percussion bilaterally    CV:  RRR  no s3 or  3/6 SEM    s increase in P2, and no edema   ABD:  soft and nontender    MS:  Nl gait/ ext warm without deformities Or obvious joint restrictions  calf tenderness, cyanosis or clubbing     SKIN: warm and dry without lesions    NEURO:  alert, unusual affect but  nl sensorium with  no motor or cerebellar deficits apparent.           Assessment   Assessment & Plan Mild persistent asthma without complication Never smoker/ Mild/mod asthma but no evidence of copd clinically  - PFT 05/11/16 >> FEV1 1.47 (77%), ratio 0.68, TLC 4.84 (101%), DLCO 65%, +BD - 01/07/2024  After extensive coaching inhaler device,  effectiveness =    50% from a baseline of 0 > try symbicort  80 2bid  and d/c breztri /singulair  (not taking them anyway)  - 05/05/2024  After extensive coaching inhaler device,  effectiveness =    50% (short Ti)   Despite suboptimal hfa/ >  doing ok by her hx but not understanding how/ when to use what meds so rec:  >>>  F/u 6weeks with all RESPmeds in hand using a trust but verify approach to confirm accurate Medication  Reconciliation The principal here is that until we are certain that the  patients are doing what we've asked, it makes no sense to ask them to do more.          Each maintenance medication was  reviewed in detail including emphasizing most importantly the difference between maintenance and prns and under what circumstances the prns are to be triggered using an action plan format where appropriate.  Total time for H and P, chart review, counseling, reviewing hfa  device(s) and generating customized AVS unique to this office visit / same day charting = 35 min          AVS  Patient Instructions  Work on inhaler technique:  relax and gently blow all the way out then take a nice smooth full deep breath back in, triggering the inhaler at same time you start breathing in.  Hold breath in for at least  5 seconds if you can. Blow out Breyna   thru nose. Rinse and gargle with water  when done.  If mouth or throat bother you at all,  try brushing teeth/gums/tongue with arm and hammer toothpaste/ make a slurry and gargle and spit out.   >>>  Remember how golfers warm up by taking practice swings - do this with an empty inhaler    Please schedule a follow up office visit in 6 weeks, call sooner if needed with all RESPIRATORY '';,medications /inhalers/ solutions in hand so we can verify exactly what you are taking. This includes all medications from all doctors and over the counters     Ozell America, MD 05/06/2024                    "

## 2024-05-05 NOTE — Patient Instructions (Addendum)
 Work on inhaler technique:  relax and gently blow all the way out then take a nice smooth full deep breath back in, triggering the inhaler at same time you start breathing in.  Hold breath in for at least  5 seconds if you can. Blow out Breyna   thru nose. Rinse and gargle with water  when done.  If mouth or throat bother you at all,  try brushing teeth/gums/tongue with arm and hammer toothpaste/ make a slurry and gargle and spit out.   >>>  Remember how golfers warm up by taking practice swings - do this with an empty inhaler    Please schedule a follow up office visit in 6 weeks, call sooner if needed with all RESPIRATORY '';,medications /inhalers/ solutions in hand so we can verify exactly what you are taking. This includes all medications from all doctors and over the counters

## 2024-05-06 NOTE — Assessment & Plan Note (Addendum)
 Never smoker/ Mild/mod asthma but no evidence of copd clinically  - PFT 05/11/16 >> FEV1 1.47 (77%), ratio 0.68, TLC 4.84 (101%), DLCO 65%, +BD - 01/07/2024  After extensive coaching inhaler device,  effectiveness =    50% from a baseline of 0 > try symbicort  80 2bid  and d/c breztri /singulair  (not taking them anyway)  - 05/05/2024  After extensive coaching inhaler device,  effectiveness =    50% (short Ti)   Despite suboptimal hfa/ >  doing ok by her hx but not understanding how/ when to use what meds so rec:  >>>  F/u 6weeks with all RESPmeds in hand using a trust but verify approach to confirm accurate Medication  Reconciliation The principal here is that until we are certain that the  patients are doing what we've asked, it makes no sense to ask them to do more.          Each maintenance medication was reviewed in detail including emphasizing most importantly the difference between maintenance and prns and under what circumstances the prns are to be triggered using an action plan format where appropriate.  Total time for H and P, chart review, counseling, reviewing hfa  device(s) and generating customized AVS unique to this office visit / same day charting = 35 min

## 2024-05-13 ENCOUNTER — Other Ambulatory Visit: Payer: Self-pay | Admitting: Cardiology

## 2024-05-13 DIAGNOSIS — I4819 Other persistent atrial fibrillation: Secondary | ICD-10-CM

## 2024-05-15 ENCOUNTER — Ambulatory Visit: Payer: Self-pay | Admitting: Cardiovascular Disease

## 2024-06-16 ENCOUNTER — Ambulatory Visit: Admitting: Cardiovascular Disease

## 2024-10-12 ENCOUNTER — Ambulatory Visit: Admitting: Internal Medicine

## 2024-11-09 ENCOUNTER — Ambulatory Visit: Admitting: Nurse Practitioner
